# Patient Record
Sex: Female | Born: 1950
Health system: Southern US, Community
[De-identification: ages and names within clinical notes are randomized; demographics above are authoritative.]

## PROBLEM LIST (undated history)

## (undated) DIAGNOSIS — I252 Old myocardial infarction: Secondary | ICD-10-CM

## (undated) DIAGNOSIS — K760 Fatty (change of) liver, not elsewhere classified: Secondary | ICD-10-CM

## (undated) DIAGNOSIS — Z8742 Personal history of other diseases of the female genital tract: Secondary | ICD-10-CM

## (undated) DIAGNOSIS — G4733 Obstructive sleep apnea (adult) (pediatric): Secondary | ICD-10-CM

## (undated) DIAGNOSIS — Z973 Presence of spectacles and contact lenses: Secondary | ICD-10-CM

## (undated) DIAGNOSIS — Z9289 Personal history of other medical treatment: Secondary | ICD-10-CM

## (undated) DIAGNOSIS — Z9989 Dependence on other enabling machines and devices: Secondary | ICD-10-CM

## (undated) DIAGNOSIS — E785 Hyperlipidemia, unspecified: Secondary | ICD-10-CM

## (undated) DIAGNOSIS — F329 Major depressive disorder, single episode, unspecified: Secondary | ICD-10-CM

## (undated) DIAGNOSIS — E119 Type 2 diabetes mellitus without complications: Secondary | ICD-10-CM

## (undated) DIAGNOSIS — F32A Depression, unspecified: Secondary | ICD-10-CM

## (undated) DIAGNOSIS — Z955 Presence of coronary angioplasty implant and graft: Secondary | ICD-10-CM

## (undated) DIAGNOSIS — J3089 Other allergic rhinitis: Secondary | ICD-10-CM

## (undated) DIAGNOSIS — R0602 Shortness of breath: Secondary | ICD-10-CM

## (undated) DIAGNOSIS — E559 Vitamin D deficiency, unspecified: Secondary | ICD-10-CM

## (undated) DIAGNOSIS — M21961 Unspecified acquired deformity of right lower leg: Secondary | ICD-10-CM

## (undated) DIAGNOSIS — I10 Essential (primary) hypertension: Secondary | ICD-10-CM

## (undated) DIAGNOSIS — Z972 Presence of dental prosthetic device (complete) (partial): Secondary | ICD-10-CM

## (undated) DIAGNOSIS — K219 Gastro-esophageal reflux disease without esophagitis: Secondary | ICD-10-CM

## (undated) DIAGNOSIS — I2510 Atherosclerotic heart disease of native coronary artery without angina pectoris: Secondary | ICD-10-CM

## (undated) DIAGNOSIS — J302 Other seasonal allergic rhinitis: Secondary | ICD-10-CM

## (undated) DIAGNOSIS — Z8639 Personal history of other endocrine, nutritional and metabolic disease: Secondary | ICD-10-CM

## (undated) DIAGNOSIS — M79671 Pain in right foot: Secondary | ICD-10-CM

## (undated) DIAGNOSIS — M549 Dorsalgia, unspecified: Secondary | ICD-10-CM

## (undated) DIAGNOSIS — K5909 Other constipation: Secondary | ICD-10-CM

## (undated) DIAGNOSIS — K5900 Constipation, unspecified: Secondary | ICD-10-CM

## (undated) DIAGNOSIS — G4730 Sleep apnea, unspecified: Secondary | ICD-10-CM

## (undated) DIAGNOSIS — M2550 Pain in unspecified joint: Secondary | ICD-10-CM

## (undated) HISTORY — DX: Gastro-esophageal reflux disease without esophagitis: K21.9

## (undated) HISTORY — DX: Atherosclerotic heart disease of native coronary artery without angina pectoris: I25.10

## (undated) HISTORY — DX: Dorsalgia, unspecified: M54.9

## (undated) HISTORY — DX: Major depressive disorder, single episode, unspecified: F32.9

## (undated) HISTORY — DX: Depression, unspecified: F32.A

## (undated) HISTORY — DX: Constipation, unspecified: K59.00

## (undated) HISTORY — DX: Pain in unspecified joint: M25.50

## (undated) HISTORY — DX: Essential (primary) hypertension: I10

## (undated) HISTORY — PX: TONSILLECTOMY AND ADENOIDECTOMY: SUR1326

## (undated) HISTORY — PX: CORONARY ANGIOPLASTY WITH STENT PLACEMENT: SHX49

## (undated) HISTORY — DX: Hyperlipidemia, unspecified: E78.5

## (undated) HISTORY — PX: CARDIAC CATHETERIZATION: SHX172

## (undated) HISTORY — DX: Fatty (change of) liver, not elsewhere classified: K76.0

## (undated) HISTORY — DX: Sleep apnea, unspecified: G47.30

## (undated) HISTORY — DX: Old myocardial infarction: I25.2

## (undated) HISTORY — PX: CARDIOVASCULAR STRESS TEST: SHX262

## (undated) HISTORY — DX: Shortness of breath: R06.02

---
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
## 1983-06-12 HISTORY — PX: TUBAL LIGATION: SHX77

## 1998-08-04 ENCOUNTER — Other Ambulatory Visit: Admission: RE | Admit: 1998-08-04 | Discharge: 1998-08-04 | Payer: Self-pay | Admitting: Obstetrics and Gynecology

## 1998-08-08 ENCOUNTER — Encounter: Admission: RE | Admit: 1998-08-08 | Discharge: 1998-11-06 | Payer: Self-pay | Admitting: Obstetrics and Gynecology

## 2000-02-21 ENCOUNTER — Other Ambulatory Visit: Admission: RE | Admit: 2000-02-21 | Discharge: 2000-02-21 | Payer: Self-pay | Admitting: Obstetrics and Gynecology

## 2001-06-11 ENCOUNTER — Encounter: Payer: Self-pay | Admitting: Internal Medicine

## 2001-06-11 LAB — CONVERTED CEMR LAB

## 2001-06-26 ENCOUNTER — Other Ambulatory Visit: Admission: RE | Admit: 2001-06-26 | Discharge: 2001-06-26 | Payer: Self-pay | Admitting: Obstetrics & Gynecology

## 2001-10-24 ENCOUNTER — Inpatient Hospital Stay (HOSPITAL_COMMUNITY): Admission: EM | Admit: 2001-10-24 | Discharge: 2001-10-28 | Payer: Self-pay | Admitting: Emergency Medicine

## 2001-10-24 DIAGNOSIS — I252 Old myocardial infarction: Secondary | ICD-10-CM

## 2001-10-24 HISTORY — DX: Old myocardial infarction: I25.2

## 2001-10-27 DIAGNOSIS — Z955 Presence of coronary angioplasty implant and graft: Secondary | ICD-10-CM

## 2001-10-27 HISTORY — DX: Presence of coronary angioplasty implant and graft: Z95.5

## 2002-09-11 ENCOUNTER — Ambulatory Visit (HOSPITAL_COMMUNITY): Admission: RE | Admit: 2002-09-11 | Discharge: 2002-09-11 | Payer: Self-pay | Admitting: Cardiology

## 2002-09-11 ENCOUNTER — Encounter: Payer: Self-pay | Admitting: Cardiology

## 2004-08-23 ENCOUNTER — Ambulatory Visit: Payer: Self-pay | Admitting: Cardiology

## 2004-08-29 ENCOUNTER — Ambulatory Visit: Payer: Self-pay | Admitting: Internal Medicine

## 2004-08-30 ENCOUNTER — Ambulatory Visit: Payer: Self-pay | Admitting: Cardiology

## 2004-08-31 ENCOUNTER — Ambulatory Visit: Payer: Self-pay | Admitting: Internal Medicine

## 2004-08-31 ENCOUNTER — Ambulatory Visit: Payer: Self-pay

## 2004-09-06 ENCOUNTER — Ambulatory Visit: Payer: Self-pay | Admitting: Cardiology

## 2004-10-10 ENCOUNTER — Ambulatory Visit: Payer: Self-pay | Admitting: Internal Medicine

## 2004-10-23 ENCOUNTER — Encounter: Admission: RE | Admit: 2004-10-23 | Discharge: 2005-01-21 | Payer: Self-pay | Admitting: Internal Medicine

## 2004-10-23 ENCOUNTER — Ambulatory Visit: Payer: Self-pay | Admitting: Internal Medicine

## 2004-11-07 ENCOUNTER — Ambulatory Visit (HOSPITAL_BASED_OUTPATIENT_CLINIC_OR_DEPARTMENT_OTHER): Admission: RE | Admit: 2004-11-07 | Discharge: 2004-11-07 | Payer: Self-pay | Admitting: Internal Medicine

## 2004-11-12 ENCOUNTER — Ambulatory Visit: Payer: Self-pay | Admitting: Internal Medicine

## 2004-11-20 ENCOUNTER — Ambulatory Visit: Payer: Self-pay | Admitting: Internal Medicine

## 2004-11-22 ENCOUNTER — Ambulatory Visit: Payer: Self-pay | Admitting: Internal Medicine

## 2004-11-22 ENCOUNTER — Ambulatory Visit: Payer: Self-pay | Admitting: Cardiology

## 2004-12-11 ENCOUNTER — Ambulatory Visit: Payer: Self-pay | Admitting: Internal Medicine

## 2005-01-05 ENCOUNTER — Ambulatory Visit: Payer: Self-pay | Admitting: Internal Medicine

## 2005-01-10 ENCOUNTER — Ambulatory Visit: Payer: Self-pay | Admitting: Internal Medicine

## 2005-04-13 ENCOUNTER — Ambulatory Visit: Payer: Self-pay | Admitting: Cardiology

## 2005-08-06 ENCOUNTER — Ambulatory Visit: Payer: Self-pay | Admitting: Internal Medicine

## 2005-09-12 ENCOUNTER — Ambulatory Visit: Payer: Self-pay | Admitting: Internal Medicine

## 2005-10-04 ENCOUNTER — Ambulatory Visit: Payer: Self-pay | Admitting: Internal Medicine

## 2006-03-04 ENCOUNTER — Ambulatory Visit: Payer: Self-pay | Admitting: Internal Medicine

## 2006-04-26 ENCOUNTER — Ambulatory Visit: Payer: Self-pay | Admitting: Internal Medicine

## 2006-05-31 ENCOUNTER — Ambulatory Visit: Payer: Self-pay | Admitting: Cardiology

## 2006-06-19 ENCOUNTER — Ambulatory Visit: Payer: Self-pay | Admitting: Internal Medicine

## 2007-01-11 ENCOUNTER — Encounter: Payer: Self-pay | Admitting: Internal Medicine

## 2007-01-11 DIAGNOSIS — G4733 Obstructive sleep apnea (adult) (pediatric): Secondary | ICD-10-CM | POA: Insufficient documentation

## 2007-01-11 DIAGNOSIS — I2510 Atherosclerotic heart disease of native coronary artery without angina pectoris: Secondary | ICD-10-CM | POA: Insufficient documentation

## 2007-01-11 DIAGNOSIS — E669 Obesity, unspecified: Secondary | ICD-10-CM | POA: Insufficient documentation

## 2007-01-11 DIAGNOSIS — F331 Major depressive disorder, recurrent, moderate: Secondary | ICD-10-CM | POA: Insufficient documentation

## 2007-01-11 DIAGNOSIS — I10 Essential (primary) hypertension: Secondary | ICD-10-CM | POA: Insufficient documentation

## 2007-01-11 DIAGNOSIS — J3089 Other allergic rhinitis: Secondary | ICD-10-CM | POA: Insufficient documentation

## 2007-01-11 DIAGNOSIS — E118 Type 2 diabetes mellitus with unspecified complications: Secondary | ICD-10-CM | POA: Insufficient documentation

## 2007-01-11 DIAGNOSIS — K219 Gastro-esophageal reflux disease without esophagitis: Secondary | ICD-10-CM | POA: Insufficient documentation

## 2007-01-11 DIAGNOSIS — E1169 Type 2 diabetes mellitus with other specified complication: Secondary | ICD-10-CM | POA: Insufficient documentation

## 2007-01-11 DIAGNOSIS — J302 Other seasonal allergic rhinitis: Secondary | ICD-10-CM | POA: Insufficient documentation

## 2007-01-11 DIAGNOSIS — E785 Hyperlipidemia, unspecified: Secondary | ICD-10-CM | POA: Insufficient documentation

## 2007-01-11 DIAGNOSIS — F329 Major depressive disorder, single episode, unspecified: Secondary | ICD-10-CM | POA: Insufficient documentation

## 2007-02-08 ENCOUNTER — Encounter: Payer: Self-pay | Admitting: *Deleted

## 2007-05-22 ENCOUNTER — Encounter: Payer: Self-pay | Admitting: Internal Medicine

## 2007-08-20 ENCOUNTER — Encounter: Payer: Self-pay | Admitting: Internal Medicine

## 2007-08-22 ENCOUNTER — Encounter: Payer: Self-pay | Admitting: Internal Medicine

## 2007-09-25 ENCOUNTER — Ambulatory Visit: Payer: Self-pay | Admitting: Internal Medicine

## 2007-09-25 LAB — CONVERTED CEMR LAB
ALT: 36 units/L — ABNORMAL HIGH (ref 0–35)
AST: 25 units/L (ref 0–37)
Albumin: 3.9 g/dL (ref 3.5–5.2)
Alkaline Phosphatase: 61 units/L (ref 39–117)
BUN: 15 mg/dL (ref 6–23)
Basophils Absolute: 0 10*3/uL (ref 0.0–0.1)
Basophils Relative: 0.1 % (ref 0.0–1.0)
Bilirubin Urine: NEGATIVE
Bilirubin, Direct: 0.1 mg/dL (ref 0.0–0.3)
CO2: 27 meq/L (ref 19–32)
Calcium: 9 mg/dL (ref 8.4–10.5)
Chloride: 107 meq/L (ref 96–112)
Cholesterol: 119 mg/dL (ref 0–200)
Creatinine, Ser: 0.7 mg/dL (ref 0.4–1.2)
Eosinophils Absolute: 0.1 10*3/uL (ref 0.0–0.7)
Eosinophils Relative: 2.3 % (ref 0.0–5.0)
GFR calc Af Amer: 111 mL/min
GFR calc non Af Amer: 92 mL/min
Glucose, Bld: 120 mg/dL — ABNORMAL HIGH (ref 70–99)
HCT: 41.1 % (ref 36.0–46.0)
HDL: 42.2 mg/dL (ref >39.0)
Hemoglobin, Urine: NEGATIVE
Hemoglobin: 14 g/dL (ref 12.0–15.0)
Hgb A1c MFr Bld: 6.5 % — ABNORMAL HIGH (ref 4.6–6.0)
Ketones, ur: NEGATIVE mg/dL
LDL Cholesterol: 57 mg/dL (ref 0–99)
Lymphocytes Relative: 36.7 % (ref 12.0–46.0)
MCHC: 34.1 g/dL (ref 30.0–36.0)
MCV: 88.5 fL (ref 78.0–100.0)
Monocytes Absolute: 0.2 10*3/uL (ref 0.1–1.0)
Monocytes Relative: 4.8 % (ref 3.0–12.0)
Mucus, UA: NEGATIVE
Neutro Abs: 2.9 10*3/uL (ref 1.4–7.7)
Neutrophils Relative %: 56.1 % (ref 43.0–77.0)
Nitrite: NEGATIVE
Platelets: 170 10*3/uL (ref 150–400)
Potassium: 3.5 meq/L (ref 3.5–5.1)
RBC / HPF: NONE SEEN
RBC: 4.64 M/uL (ref 3.87–5.11)
RDW: 14.6 % (ref 11.5–14.6)
Sodium: 145 meq/L (ref 135–145)
Specific Gravity, Urine: 1.025 (ref 1.000–1.03)
TSH: 0.5 microintl units/mL (ref 0.35–5.50)
Total Bilirubin: 0.5 mg/dL (ref 0.3–1.2)
Total CHOL/HDL Ratio: 2.8
Total Protein: 6.6 g/dL (ref 6.0–8.3)
Triglycerides: 98 mg/dL (ref 0–149)
Urine Glucose: NEGATIVE mg/dL
Urobilinogen, UA: 0.2 (ref 0.0–1.0)
VLDL: 20 mg/dL (ref 0–40)
WBC: 5.1 10*3/uL (ref 4.5–10.5)
pH: 6.5 (ref 5.0–8.0)

## 2007-09-26 ENCOUNTER — Encounter: Payer: Self-pay | Admitting: Internal Medicine

## 2007-10-01 ENCOUNTER — Ambulatory Visit: Payer: Self-pay | Admitting: Internal Medicine

## 2007-10-01 DIAGNOSIS — K5900 Constipation, unspecified: Secondary | ICD-10-CM | POA: Insufficient documentation

## 2007-10-08 ENCOUNTER — Telehealth: Payer: Self-pay | Admitting: Internal Medicine

## 2007-10-09 ENCOUNTER — Ambulatory Visit: Payer: Self-pay | Admitting: Cardiology

## 2007-10-16 ENCOUNTER — Ambulatory Visit: Payer: Self-pay | Admitting: Gastroenterology

## 2007-10-28 ENCOUNTER — Telehealth: Payer: Self-pay | Admitting: Internal Medicine

## 2007-11-05 ENCOUNTER — Encounter: Payer: Self-pay | Admitting: Gastroenterology

## 2007-11-05 ENCOUNTER — Ambulatory Visit: Payer: Self-pay | Admitting: Gastroenterology

## 2007-11-05 LAB — HM COLONOSCOPY

## 2007-11-07 ENCOUNTER — Encounter: Payer: Self-pay | Admitting: Gastroenterology

## 2007-11-11 ENCOUNTER — Encounter: Payer: Self-pay | Admitting: Internal Medicine

## 2007-11-11 ENCOUNTER — Ambulatory Visit: Payer: Self-pay

## 2008-06-15 ENCOUNTER — Telehealth (INDEPENDENT_AMBULATORY_CARE_PROVIDER_SITE_OTHER): Payer: Self-pay | Admitting: *Deleted

## 2008-07-02 ENCOUNTER — Encounter: Payer: Self-pay | Admitting: Internal Medicine

## 2008-08-05 ENCOUNTER — Ambulatory Visit (HOSPITAL_BASED_OUTPATIENT_CLINIC_OR_DEPARTMENT_OTHER): Admission: RE | Admit: 2008-08-05 | Discharge: 2008-08-05 | Payer: Self-pay | Admitting: Internal Medicine

## 2008-08-05 ENCOUNTER — Ambulatory Visit: Payer: Self-pay | Admitting: Internal Medicine

## 2008-08-05 ENCOUNTER — Ambulatory Visit: Payer: Self-pay | Admitting: Diagnostic Radiology

## 2008-08-05 DIAGNOSIS — R498 Other voice and resonance disorders: Secondary | ICD-10-CM | POA: Insufficient documentation

## 2008-08-05 DIAGNOSIS — F17200 Nicotine dependence, unspecified, uncomplicated: Secondary | ICD-10-CM

## 2008-08-05 LAB — CONVERTED CEMR LAB
ALT: 54 units/L — ABNORMAL HIGH (ref 0–35)
AST: 31 units/L (ref 0–37)
Albumin: 3.9 g/dL (ref 3.5–5.2)
Alkaline Phosphatase: 66 units/L (ref 39–117)
BUN: 10 mg/dL (ref 6–23)
Bilirubin, Direct: 0.1 mg/dL (ref 0.0–0.3)
CO2: 29 meq/L (ref 19–32)
Calcium: 8.7 mg/dL (ref 8.4–10.5)
Chloride: 105 meq/L (ref 96–112)
Creatinine, Ser: 0.7 mg/dL (ref 0.4–1.2)
Creatinine,U: 195.3 mg/dL
GFR calc Af Amer: 111 mL/min
GFR calc non Af Amer: 91 mL/min
Glucose, Bld: 134 mg/dL — ABNORMAL HIGH (ref 70–99)
Hgb A1c MFr Bld: 6.7 % — ABNORMAL HIGH (ref 4.6–6.0)
Microalb Creat Ratio: 9.7 mg/g (ref 0.0–30.0)
Microalb, Ur: 1.9 mg/dL (ref 0.0–1.9)
Potassium: 3.7 meq/L (ref 3.5–5.1)
Sodium: 142 meq/L (ref 135–145)
TSH: 0.62 microintl units/mL (ref 0.35–5.50)
Total Bilirubin: 0.6 mg/dL (ref 0.3–1.2)
Total Protein: 6.7 g/dL (ref 6.0–8.3)

## 2008-09-13 ENCOUNTER — Encounter: Payer: Self-pay | Admitting: Internal Medicine

## 2008-09-15 ENCOUNTER — Encounter (INDEPENDENT_AMBULATORY_CARE_PROVIDER_SITE_OTHER): Payer: Self-pay | Admitting: *Deleted

## 2008-12-21 ENCOUNTER — Telehealth: Payer: Self-pay | Admitting: Family Medicine

## 2008-12-31 ENCOUNTER — Telehealth: Payer: Self-pay | Admitting: Internal Medicine

## 2009-01-06 ENCOUNTER — Ambulatory Visit: Payer: Self-pay | Admitting: Cardiology

## 2009-01-06 DIAGNOSIS — R0602 Shortness of breath: Secondary | ICD-10-CM | POA: Insufficient documentation

## 2009-01-11 ENCOUNTER — Ambulatory Visit: Payer: Self-pay | Admitting: Internal Medicine

## 2009-01-11 DIAGNOSIS — M25539 Pain in unspecified wrist: Secondary | ICD-10-CM | POA: Insufficient documentation

## 2009-01-11 LAB — HM DIABETES FOOT EXAM

## 2009-01-12 ENCOUNTER — Ambulatory Visit: Payer: Self-pay | Admitting: Cardiology

## 2009-01-12 DIAGNOSIS — I10 Essential (primary) hypertension: Secondary | ICD-10-CM | POA: Insufficient documentation

## 2009-01-13 ENCOUNTER — Encounter (INDEPENDENT_AMBULATORY_CARE_PROVIDER_SITE_OTHER): Payer: Self-pay | Admitting: *Deleted

## 2009-01-13 LAB — CONVERTED CEMR LAB
ALT: 44 units/L — ABNORMAL HIGH (ref 0–35)
AST: 33 units/L (ref 0–37)
Albumin: 3.7 g/dL (ref 3.5–5.2)
Alkaline Phosphatase: 63 units/L (ref 39–117)
BUN: 15 mg/dL (ref 6–23)
Bilirubin, Direct: 0.2 mg/dL (ref 0.0–0.3)
CO2: 29 meq/L (ref 19–32)
Calcium: 8.9 mg/dL (ref 8.4–10.5)
Chloride: 106 meq/L (ref 96–112)
Cholesterol: 134 mg/dL (ref 0–200)
Creatinine, Ser: 0.8 mg/dL (ref 0.4–1.2)
GFR calc non Af Amer: 78.04 mL/min (ref >60)
Glucose, Bld: 143 mg/dL — ABNORMAL HIGH (ref 70–99)
HDL: 37.3 mg/dL — ABNORMAL LOW (ref >39.00)
LDL Cholesterol: 75 mg/dL (ref 0–99)
Potassium: 4.2 meq/L (ref 3.5–5.1)
Sodium: 142 meq/L (ref 135–145)
Total Bilirubin: 0.7 mg/dL (ref 0.3–1.2)
Total CHOL/HDL Ratio: 4
Total Protein: 6.4 g/dL (ref 6.0–8.3)
Triglycerides: 108 mg/dL (ref 0.0–149.0)
VLDL: 21.6 mg/dL (ref 0.0–40.0)

## 2009-02-21 ENCOUNTER — Telehealth: Payer: Self-pay | Admitting: Internal Medicine

## 2009-03-07 ENCOUNTER — Ambulatory Visit: Payer: Self-pay | Admitting: Cardiology

## 2009-06-23 ENCOUNTER — Telehealth: Payer: Self-pay | Admitting: Gastroenterology

## 2009-08-30 ENCOUNTER — Ambulatory Visit: Payer: Self-pay | Admitting: Internal Medicine

## 2009-08-30 LAB — CONVERTED CEMR LAB
BUN: 13 mg/dL (ref 6–23)
CO2: 23 meq/L (ref 19–32)
Calcium: 9.4 mg/dL (ref 8.4–10.5)
Chloride: 106 meq/L (ref 96–112)
Creatinine, Ser: 0.84 mg/dL (ref 0.40–1.20)
Glucose, Bld: 111 mg/dL — ABNORMAL HIGH (ref 70–99)
Hgb A1c MFr Bld: 6.7 % — ABNORMAL HIGH (ref 4.6–6.1)
Potassium: 3.9 meq/L (ref 3.5–5.3)
Sodium: 142 meq/L (ref 135–145)

## 2009-08-31 ENCOUNTER — Encounter: Payer: Self-pay | Admitting: Internal Medicine

## 2009-10-03 ENCOUNTER — Encounter: Payer: Self-pay | Admitting: Internal Medicine

## 2009-10-04 ENCOUNTER — Ambulatory Visit: Payer: Self-pay | Admitting: Internal Medicine

## 2009-10-04 DIAGNOSIS — J40 Bronchitis, not specified as acute or chronic: Secondary | ICD-10-CM | POA: Insufficient documentation

## 2009-11-16 ENCOUNTER — Encounter (INDEPENDENT_AMBULATORY_CARE_PROVIDER_SITE_OTHER): Payer: Self-pay | Admitting: *Deleted

## 2009-12-27 ENCOUNTER — Ambulatory Visit: Payer: Self-pay | Admitting: Internal Medicine

## 2009-12-27 LAB — CONVERTED CEMR LAB
ALT: 26 units/L (ref 0–35)
AST: 20 units/L (ref 0–37)
BUN: 13 mg/dL (ref 6–23)
CO2: 26 meq/L (ref 19–32)
Calcium: 9.4 mg/dL (ref 8.4–10.5)
Chloride: 105 meq/L (ref 96–112)
Cholesterol: 142 mg/dL (ref 0–200)
Creatinine, Ser: 0.7 mg/dL (ref 0.4–1.2)
Direct LDL: 77.7 mg/dL
GFR calc non Af Amer: 89.27 mL/min (ref >60)
Glucose, Bld: 99 mg/dL (ref 70–99)
HDL: 42 mg/dL (ref >39.00)
Hgb A1c MFr Bld: 6.1 % (ref 4.6–6.5)
Potassium: 3.9 meq/L (ref 3.5–5.1)
Sodium: 139 meq/L (ref 135–145)
Total CHOL/HDL Ratio: 3
Triglycerides: 204 mg/dL — ABNORMAL HIGH (ref 0.0–149.0)
VLDL: 40.8 mg/dL — ABNORMAL HIGH (ref 0.0–40.0)

## 2009-12-28 ENCOUNTER — Telehealth: Payer: Self-pay | Admitting: Internal Medicine

## 2010-01-02 ENCOUNTER — Telehealth: Payer: Self-pay | Admitting: Internal Medicine

## 2010-01-03 ENCOUNTER — Ambulatory Visit: Payer: Self-pay | Admitting: Internal Medicine

## 2010-01-03 ENCOUNTER — Ambulatory Visit: Payer: Self-pay | Admitting: Cardiology

## 2010-03-21 ENCOUNTER — Encounter: Payer: Self-pay | Admitting: Internal Medicine

## 2010-03-27 ENCOUNTER — Telehealth: Payer: Self-pay | Admitting: Internal Medicine

## 2010-03-29 ENCOUNTER — Ambulatory Visit: Payer: Self-pay | Admitting: Internal Medicine

## 2010-03-29 LAB — CONVERTED CEMR LAB
BUN: 15 mg/dL (ref 6–23)
CO2: 32 meq/L (ref 19–32)
Calcium: 9.8 mg/dL (ref 8.4–10.5)
Chloride: 106 meq/L (ref 96–112)
Creatinine, Ser: 0.8 mg/dL (ref 0.4–1.2)
GFR calc non Af Amer: 73.46 mL/min (ref >60)
Glucose, Bld: 98 mg/dL (ref 70–99)
Hgb A1c MFr Bld: 6.2 % (ref 4.6–6.5)
Potassium: 4.4 meq/L (ref 3.5–5.1)
Sodium: 143 meq/L (ref 135–145)

## 2010-04-06 ENCOUNTER — Ambulatory Visit: Payer: Self-pay | Admitting: Diagnostic Radiology

## 2010-04-06 ENCOUNTER — Ambulatory Visit (HOSPITAL_BASED_OUTPATIENT_CLINIC_OR_DEPARTMENT_OTHER): Admission: RE | Admit: 2010-04-06 | Discharge: 2010-04-06 | Payer: Self-pay | Admitting: Internal Medicine

## 2010-04-06 ENCOUNTER — Ambulatory Visit: Payer: Self-pay | Admitting: Internal Medicine

## 2010-04-06 LAB — CONVERTED CEMR LAB
ALT: 19 units/L (ref 0–35)
AST: 13 units/L (ref 0–37)
Albumin: 4.2 g/dL (ref 3.5–5.2)
Alkaline Phosphatase: 68 units/L (ref 39–117)
Bilirubin, Direct: 0.1 mg/dL (ref 0.0–0.3)
Indirect Bilirubin: 0.2 mg/dL (ref 0.0–0.9)
Total Bilirubin: 0.3 mg/dL (ref 0.3–1.2)
Total Protein: 6.4 g/dL (ref 6.0–8.3)

## 2010-04-07 ENCOUNTER — Telehealth: Payer: Self-pay | Admitting: Internal Medicine

## 2010-04-18 ENCOUNTER — Encounter: Admission: RE | Admit: 2010-04-18 | Discharge: 2010-04-18 | Payer: Self-pay | Admitting: Internal Medicine

## 2010-04-18 LAB — HM MAMMOGRAPHY

## 2010-04-19 ENCOUNTER — Encounter: Payer: Self-pay | Admitting: Internal Medicine

## 2010-04-26 ENCOUNTER — Encounter: Payer: Self-pay | Admitting: Internal Medicine

## 2010-06-11 HISTORY — PX: BUNIONECTOMY: SHX129

## 2010-07-01 ENCOUNTER — Encounter: Payer: Self-pay | Admitting: Internal Medicine

## 2010-07-11 ENCOUNTER — Telehealth: Payer: Self-pay | Admitting: Internal Medicine

## 2010-07-11 NOTE — Assessment & Plan Note (Signed)
Summary: 3 month fu/dt   Vital Signs:  Patient profile:   60 year old female Height:      64 inches Weight:      176.50 pounds BMI:     30.41 O2 Sat:      96 % on Room air Temp:     98.4 degrees F rectal Pulse rate:   80 / minute Pulse rhythm:   regular Resp:     20 per minute BP sitting:   100 / 70  (left arm) Cuff size:   large  Vitals Entered By: Glendell Docker CMA (April 06, 2010 3:50 PM)  O2 Flow:  Room air CC: 3 Month follow up , Type 2 diabetes mellitus follow-up Is Patient Diabetic? Yes Did you bring your meter with you today? No Pain Assessment Patient in pain? no      Comments low blood sugar 98 high 138 avg 120   Primary Care Provider:  Dondra Spry DO  CC:  3 Month follow up  and Type 2 diabetes mellitus follow-up.  History of Present Illness:  Type 2 Diabetes Mellitus Follow-Up      This is a 60 year old woman who presents for Type 2 diabetes mellitus follow-up.  The patient denies weight gain.  The patient denies the following symptoms: chest pain.  Since the last visit the patient reports good dietary compliance, compliance with medications, and monitoring blood glucose.   htn - stable.  no dizziness but bp is low normal     Preventive Screening-Counseling & Management  Alcohol-Tobacco     Smoking Status: current     Smoking Cessation Counseling: yes  Allergies (verified): No Known Drug Allergies  Past History:  Past Medical History: CORONARY ARTERY DISEASE (ICD-414.00) HYPERTENSION (ICD-401.9) HYPERLIPIDEMIA (ICD-272.4)    OBSTRUCTIVE SLEEP APNEA (ICD-327.23)  GERD (ICD-530.81) DIABETES MELLITUS, TYPE II (ICD-250.00) DEPRESSION (ICD-311) ALLERGIC RHINITIS (ICD-477.9)  Past Surgical History:   The study was done as a Taxus followup study. The patient previously underwent percutaneous stenting using a small 2.25 mm stent in May of 2003.  Following the procedure, she has done well.  She was brought back as part of a mandated followup  catheterization as part of the Taxus 5 high-risk subset study.  The patient was randomized to drug eluting stent versus no drug-eluting stent.  This was done in the small-vessel category. PRESSURES: 1. Aorta:  147/76/104. Left ventricle 152/11.Marland Kitchen No gradient on pullback across the aortic valve. ANGIOGRAPHIC DATA:Ventriculography was performed the RAO projection. Because of ectopy, the ejection fraction was not calculated.  The EF appeared in excess of 60%.No definite wall-motion abnormalities were identified.  The aortic valve appeared to open well. Well-preserved left ventricular function.. Continued patency of the previously-placed stent in the diagonal branch. Mild luminal irregularities as noted.  Arturo Morton. Riley Kill, M.D. LHCTDS/MEDQ  D:  09/11/2002  T:  09/13/2002  Job:  [id number]    TAXUS trial, and a 2.25 x 16 mm stent was utilized.  Thisstent was used to cover both the high-grade lesion as well as some segmentaldisease distal to the high-grade stenosis site as a shorter stent would end in the diseased segment.  The 16 mm stent was then placed and taken up to about11 atmospheres.  There was marked improvement in the appearance of the artery.We then took a 2.5 mm x 12 Quantum Maverick balloon and stayed inside the stent edges and did post dilatation up to about 11 atmospheres.  This yieldedan artery of about 2.46.  There was no evidence of edge tear.Dictated by:   Arturo Morton Riley Kill, M.D. [facility] Attending Physician:  Olga Millers SandersDD:  10/27/01  Family History: noncontributory     Physical Exam  General:  alert, well-developed, and well-nourished.   Lungs:  normal respiratory effort and normal breath sounds.   Heart:  normal rate, regular rhythm, and no gallop.   Extremities:  no edema.   Impression & Recommendations:  Problem # 1:  DIABETES MELLITUS, TYPE II (ICD-250.00) Assessment Unchanged  Her updated medication list for this problem includes:    Metformin Hcl 500 Mg Xr24h-tab  (Metformin hcl) ..... One by mouth three times a day    Aspirin Low Dose 81 Mg Tabs (Aspirin) .Marland Kitchen... Take 1 tablet by mouth once a day    Victoza 18 Mg/35ml Soln (Liraglutide) ..... Inject 1.2 mg [place]  once daily    Benicar Hct 20-12.5 Mg Tabs (Olmesartan medoxomil-hctz) ..... One half tab by mouth once daily  Labs Reviewed: Creat: 0.8 (03/29/2010)    Reviewed HgBA1c results: 6.2 (03/29/2010)  6.1 (12/27/2009)  Problem # 2:  HYPERLIPIDEMIA (ICD-272.4) change vytorin to crestor.  Her updated medication list for this problem includes:    Crestor 20 Mg Tabs (Rosuvastatin calcium) ..... One by mouth once daily  Orders: T-Hepatic Function [id number])  Labs Reviewed: SGOT: 20 (12/27/2009)   SGPT: 26 (12/27/2009)   HDL:42.00 (12/27/2009), 37.30 (01/12/2009)  LDL:75 (01/12/2009), 57 (09/25/2007)  Chol:142 (12/27/2009), 134 (01/12/2009)  Trig:204.0 (12/27/2009), 108.0 (01/12/2009)  Problem # 3:  ESSENTIAL HYPERTENSION, BENIGN (ICD-401.1) bp is low normal.  take 1/2 of benicar / hctz dose Her updated medication list for this problem includes:    Metoprolol Tartrate 50 Mg Tabs (Metoprolol tartrate) .Marland Kitchen... Take 1 tablet by mouth twice a day    Benicar Hct 20-12.5 Mg Tabs (Olmesartan medoxomil-hctz) ..... One half tab by mouth once daily  BP today: 100/70 Prior BP: 104/70 (01/03/2010)  Labs Reviewed: K+: 4.4 (03/29/2010) Creat: : 0.8 (03/29/2010)   Chol: 142 (12/27/2009)   HDL: 42.00 (12/27/2009)   LDL: 75 (01/12/2009)   TG: 204.0 (12/27/2009)  Complete Medication List: 1)  Metformin Hcl 500 Mg Xr24h-tab (Metformin hcl) .... One by mouth three times a day 2)  Metoprolol Tartrate 50 Mg Tabs (Metoprolol tartrate) .... Take 1 tablet by mouth twice a day 3)  Onetouch Test Strp (Glucose blood) .... Use 1 strip once a day 4)  Crestor 20 Mg Tabs (Rosuvastatin calcium) .... One by mouth once daily 5)  Wellbutrin Sr 150 Mg Xr12h-tab (Bupropion hcl) .... 3 tabs once daily 6)  Onetouch Ultrasoft  Lancets Misc (Lancets) .... Test blood sugar once daily 7)  Aspirin Low Dose 81 Mg Tabs (Aspirin) .... Take 1 tablet by mouth once a day 8)  Omeprazole 40 Mg Cpdr (Omeprazole) .... One by mouth once daily 30 mins before am meal 9)  Abilify 5 Mg Tabs (Aripiprazole) .... Take 1 tablet by mouth once a day 10)  Victoza 18 Mg/28ml Soln (Liraglutide) .... Inject 1.2 mg [place]  once daily 11)  Relion Pen Needles 31g X 8 Mm Misc (Insulin pen needle) .... Use once daily as directed 12)  Benicar Hct 20-12.5 Mg Tabs (Olmesartan medoxomil-hctz) .... One half tab by mouth once daily 13)  Multivitamins Tabs (Multiple vitamin) .... Once daily 14)  Qvar 40 Mcg/act Aers (Beclomethasone dipropionate) .... 2 puffs two times a day 15)  Terbutaline Sulfate Tabs (terbutaline Sulfate)  .... Take 1 tablet by mouth once a day (patient unable  to verify dose)  Other Orders: Mammogram (Screening) (Mammo)  Patient Instructions: 1)  Please schedule a follow-up appointment in 4 months. 2)  BMP prior to visit, ICD-9:  401.9 3)  Hepatic Panel prior to visit, ICD-9:  272.4 4)  Lipid Panel prior to visit, ICD-9: 272.4 5)  HbgA1C prior to visit, ICD-9:  250.00 6)  Urine Microalbumin prior to visit, ICD-9: 250.00 7)  Please return for lab work one (1) week before your next appointment.  Prescriptions: CRESTOR 20 MG TABS (ROSUVASTATIN CALCIUM) one by mouth once daily  #30 x 5   Entered and Authorized by:   D. Thomos Lemons DO   Signed by:   D. Thomos Lemons DO on 04/06/2010   Method used:   Electronically to        [facility] [address] [phone].* (retail)       [address]       [place], [place]  [address]       Ph: [phone]       Fax: [phone]   RxID:   [id number] RELION PEN NEEDLES 31G X 8 MM MISC (INSULIN PEN NEEDLE) use once daily as directed  #100 x 3   Entered and Authorized by:   D. Thomos Lemons DO   Signed by:   D. Thomos Lemons DO on 04/06/2010   Method used:   Electronically to         [facility] [address] [phone].* (retail)       [address]       [place], [place]  [address]       Ph: [phone]       Fax: [phone]   RxID:   [id number] [facility] TEST  STRP (GLUCOSE BLOOD) Use 1 strip once a day  #100 x 3   Entered and Authorized by:   D. Thomos Lemons DO   Signed by:   D. Thomos Lemons DO on 04/06/2010   Method used:   Electronically to        [facility] [address] [phone].* (retail)       [address]       [place], [place]  [address]       Ph: [phone]       Fax: [phone]   RxID:   [id number] METFORMIN HCL 500 MG XR24H-TAB (METFORMIN HCL) one by mouth three times a day  #90 x 5   Entered and Authorized by:   D. Thomos Lemons DO   Signed by:   D. Thomos Lemons DO on 04/06/2010   Method used:   Electronically to        [facility] [address] [phone].* (retail)       [address]       [place], [place]  [address]       Ph: [phone]       Fax: [phone]   RxID:   [id number] VICTOZA 18 MG/3ML SOLN (LIRAGLUTIDE) inject 1.2 mg [place]  once daily  #1 month x 5   Entered and Authorized by:   D. Thomos Lemons DO   Signed by:   D. Thomos Lemons DO on 04/06/2010   Method used:   Electronically to        [facility]  [address] [phone].* (retail)       [address]       [place], [place]  [address]       Ph: [phone]       Fax: [phone]   RxID:   [id number]    Orders Added: 1)  Mammogram (Screening) [Mammo] 2)  T-Hepatic Function [80076-22960] 3)  Est. Patient Level III [[address]]   Immunization History:  Influenza Immunization History:    Influenza:  declined (04/06/2010)   Contraindications/Deferment of Procedures/Staging:    Test/Procedure: FLU VAX    Reason for deferment: patient declined   Immunization History:  Influenza Immunization History:    Influenza:  Declined (04/06/2010)    Preventive Care Screening  Last Flu Shot:     Date:  04/06/2010    Results:  Declined  Bone Density:    Date:  03/26/2006    Results:  Done std dev   Current Allergies (reviewed today): No known allergies

## 2010-07-11 NOTE — Assessment & Plan Note (Signed)
Summary: f/u   Vital Signs:  Patient Profile:   60 Years Old Female Height:     64 inches Weight:      180 pounds BMI:     31.01 Temp:     97.9 degrees F oral Pulse rate:   68 / minute Pulse rhythm:   regular Resp:     18 per minute BP sitting:   134 / 80  (right arm) Cuff size:   regular  Vitals Entered By: Glendell Docker CMA (August 05, 2008 2:12 PM)             Is Patient Diabetic? Yes     PCP:  Dondra Spry DO  Chief Complaint:  Follow up disease management and Type 2 diabetes mellitus follow-up.  History of Present Illness: Follow up disease management  Type 2 Diabetes Mellitus Follow-Up      This is a 60 year old woman who presents for Type 2 diabetes mellitus follow-up.  The patient reports weight gain, but denies self managed hypoglycemia and hypoglycemia requiring help.  The patient denies the following symptoms: chest pain and vision loss.  Since the last visit the patient reports poor dietary compliance, compliance with medications, and not exercising regularly.    Tob abuse - still smoking.   She reports hoarseness and intermittent wheezing.  CAD - stress test 06/09 negative.  Hyperlipidemia - tolerating meds.    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  The patient denies chest pain.     Physical  Exam  General:     alert, well-developed, and well-nourished.   Head:     normocephalic and atraumatic.   Neck:     supple, no masses, and no carotid bruits.   Lungs:     normal respiratory effort and normal breath sounds.   Heart:     normal rate, regular rhythm, and no gallop.   Abdomen:     soft and non-tender.   Extremities:     No lower extremity edema  Neurologic:     cranial nerves II-XII intact and gait normal.    Diabetes Management Exam:    Foot Exam (with socks and/or shoes not present):       Sensory-Monofilament:          Left foot: normal          Right foot: normal       Inspection:          Left foot: normal          Right foot: normal    Impression & Recommendations:  Problem # 1:  HOARSENESS (ICD-784.49)  60 y/o smoker with hoarseness and upper airway wheezing.   Refer to ENT for further eval.  Orders: ENT Referral (ENT)   Problem # 2:  HYPERTENSION (ICD-401.9) BP stable.   Maintain current medication regimen. ] Her updated medication list for this problem includes:    Altace 5 Mg Caps (Ramipril) .Marland Kitchen... Take 1 capsule by mouth once a day (office visit needed for refills)    Hydrochlorothiazide 12.5 Mg Caps (Hydrochlorothiazide) .Marland Kitchen... Take 1 capsule by mouth every morning    Metoprolol Tartrate 50 Mg Tabs (Metoprolol tartrate) .Marland Kitchen... Take 1 tablet by mouth twice a day  Orders: TLB-BMP (Basic Metabolic Panel-BMET) (80048-METABOL)  BP today: 134/80 Prior BP: 137/78 (10/01/2007)  Labs Reviewed: Creat: 0.7 (09/25/2007) Chol: 119 (09/25/2007)   HDL: 42.2 (09/25/2007)   LDL: 57 (09/25/2007)   TG: 98 (09/25/2007)   Problem # 3:  DIABETES MELLITUS, TYPE II (ICD-250.00) Monitor A1c.   I urged lifestyle changes.  Her updated medication list for this problem includes:    Altace 5 Mg Caps (Ramipril) .Marland Kitchen... Take 1 capsule by mouth once a day (office visit needed for refills)    Glucophage Xr 500 Mg Tb24 (Metformin hcl) ..... One by mouth two times a day  (office visit needed for refills)    Aspirin Low Dose 81 Mg Tabs (Aspirin) .Marland Kitchen... Take 1 tablet by mouth once a day  Orders: TLB-A1C / Hgb A1C (Glycohemoglobin) (83036-A1C) TLB-Microalbumin/Creat Ratio, Urine (82043-MALB)  Labs Reviewed: HgBA1c: 6.5 (09/25/2007)   Creat: 0.7 (09/25/2007)      Problem # 4:  TOBACCO ABUSE (ICD-305.1)  The following medications were removed from the medication list:    Nicotrol 10 Mg Inha (Nicotine) ..... Use 6-8 cartridges inh qd  Her updated medication list for this problem includes:    Chantix Starting Month Pak 0.5 Mg X 11 & 1 Mg X 42 Tabs (Varenicline tartrate) .Marland Kitchen... Take as directed Encouraged smoking cessation and discussed different methods for smoking cessation.   We discussed potential side effects.  f/u in 2 wks.  Problem # 5:  CORONARY ARTERY DISEASE (ICD-414.00) Stress testing 06/09 negative.  Continue risk factor mgt.  Her updated medication list for this problem includes:    Altace 5 Mg Caps (Ramipril) .Marland Kitchen... Take 1 capsule by mouth once a day (office visit needed for refills)    Hydrochlorothiazide 12.5 Mg Caps (Hydrochlorothiazide) .Marland Kitchen... Take 1 capsule by mouth every morning    Metoprolol Tartrate 50 Mg Tabs (Metoprolol tartrate) .Marland Kitchen... Take 1 tablet by mouth twice a day    Aspirin Low Dose 81 Mg Tabs (Aspirin) .Marland Kitchen... Take 1 tablet by mouth once a day  Labs Reviewed: Chol: 119 (09/25/2007)   HDL: 42.2 (09/25/2007)   LDL: 57 (09/25/2007)   TG: 98 (09/25/2007)   Problem # 6:  HYPERLIPIDEMIA (ICD-272.4) Monitor lipids and LFT's. Her updated medication list for this problem includes:    Vytorin 10-40 Mg Tabs (Ezetimibe-simvastatin) .Marland Kitchen... Take 1 tablet by mouth once a day (office visit needed no refills authorized after this)  Orders: TLB-Hepatic/Liver Function Pnl (80076-HEPATIC) TLB-TSH (Thyroid Stimulating Hormone) (84443-TSH)  Labs Reviewed: Chol: 119 (09/25/2007)   HDL: 42.2 (09/25/2007)   LDL: 57 (09/25/2007)   TG: 98  (09/25/2007) SGOT: 25 (09/25/2007)   SGPT: 36 (09/25/2007)   Complete Medication List: 1)  Altace 5 Mg Caps (Ramipril) .... Take 1 capsule by mouth once a day (office visit needed for refills) 2)  Hydrochlorothiazide 12.5 Mg Caps (Hydrochlorothiazide) .... Take 1 capsule by mouth every morning  3)  Glucophage Xr 500 Mg Tb24 (Metformin hcl) .... One by mouth two times a day (office visit needed for refills) 4)  Metoprolol Tartrate 50 Mg Tabs (Metoprolol tartrate) .... Take 1 tablet by mouth twice a day 5)  Onetouch Test Strp (Glucose blood) .... Use 1 strip once a day 6)  Vytorin 10-40 Mg Tabs (Ezetimibe-simvastatin) .... Take 1 tablet by mouth once a day (office visit needed no refills authorized after this) 7)  Wellbutrin Sr 200 Mg Tb12 (Bupropion hcl) .... Take 3 tablet by mouth once daily 8)  Onetouch Ultrasoft Lancets Misc (Lancets) .... Test blood sugar once daily 9)  Loratadine 10 Mg Tabs (Loratadine) .... Take 1 tablet by mouth once a day 10)  Aspirin Low Dose 81 Mg Tabs (Aspirin) .... Take 1 tablet by mouth once a day 11)  Multivitamins Tabs (Multiple vitamin) .... Take 1 tablet by mouth two times a day 12)  Onetouch Ultra Test Strp (Glucose blood) .... For once daily testing 13)  Omeprazole 20 Mg Cpdr (Omeprazole) .... One by mouth once daily 14)  Chantix Starting Month Pak 0.5 Mg X 11 & 1 Mg X 42 Tabs (Varenicline tartrate) .... Take as directed  Other Orders: Mammogram (Screening) (Mammo)   Patient Instructions: 1)  Please schedule a follow-up appointment in 2 weeks.   Prescriptions: CHANTIX STARTING MONTH PAK 0.5 MG X 11 & 1 MG X 42 TABS (VARENICLINE TARTRATE) take as directed  #1 x 0   Entered and Authorized by:   D. Thomos Lemons DO   Signed by:   D. Thomos Lemons DO on 08/05/2008   Method used:   Electronically to        [facility] [address] [phone].* (retail)       [address]       [place], [place]  [address]       Ph: [phone]       Fax:  [phone]   RxID:   [id number] OMEPRAZOLE 20 MG CPDR (OMEPRAZOLE) one by mouth once daily  #30 x 3   Entered and Authorized by:   D. Thomos Lemons DO   Signed by:   D. Thomos Lemons DO on 08/05/2008   Method used:   Electronically to        [facility] [address] [phone].* (retail)       [address]       [place], [place]  [address]       Ph: [phone]       Fax: [phone]   RxID:   [id number] [facility] ULTRA TEST   STRP (GLUCOSE BLOOD) for once daily testing  #100 x 3   Entered and Authorized by:   D. Thomos Lemons DO   Signed by:   D. Thomos Lemons DO on 08/05/2008   Method used:   Electronically to        [facility] [address] [phone].* (retail)       [address]       [place], [place]  [address]       Ph: [phone]       Fax: [phone]   RxID:   [id number] [facility] TEST  STRP (GLUCOSE BLOOD) Use 1 strip once a day  #100 x 3   Entered and Authorized by:   D. Molly Maduro  Artist Pais DO   Signed by:   D. Thomos Lemons DO on 08/05/2008   Method used:   Electronically to        [facility] [address] [phone].* (retail)       [address]       [place], [place]  [address]       Ph: [phone]       Fax: [phone]   RxID:   [id number] VYTORIN 10-40 MG TABS (EZETIMIBE-SIMVASTATIN) Take 1 tablet by mouth once a day (OFFICE VISIT NEEDED NO REFILLS AUTHORIZED AFTER THIS)  #30 x 3   Entered and Authorized by:   D. Thomos Lemons DO   Signed by:   D. Thomos Lemons DO on 08/05/2008   Method used:   Electronically to        [facility] [address] [phone].* (retail)       [address]       [place], [place]  [address]       Ph: [phone]       Fax: [phone]   RxID:   [id number] METOPROLOL TARTRATE 50 MG TABS (METOPROLOL TARTRATE) Take 1 tablet by mouth twice a day  #30 x 3   Entered and Authorized by:   D. Thomos Lemons DO   Signed by:   D. Thomos Lemons DO on  08/05/2008   Method used:   Electronically to        [facility] [address] [phone].* (retail)       [address]       [place], [place]  [address]       Ph: [phone]       Fax: [phone]   RxID:   [id number] GLUCOPHAGE XR 500 MG  TB24 (METFORMIN HCL) one by mouth two times a day (office visit needed for refills)  #60 x 3   Entered and Authorized by:   D. Thomos Lemons DO   Signed by:   D. Thomos Lemons DO on 08/05/2008   Method used:   Electronically to        [facility] [address] [phone].* (retail)       [address]       [place], [place]  [address]       Ph: [phone]       Fax: [phone]   RxID:   [id number] HYDROCHLOROTHIAZIDE 12.5 MG CAPS (HYDROCHLOROTHIAZIDE) Take 1 capsule by mouth every morning  #30 x 3   Entered and Authorized by:   D. Thomos Lemons DO   Signed by:   D. Thomos Lemons DO on 08/05/2008   Method used:   Electronically to        [facility] [address] [phone].* (retail)       [address]       [place], [place]  [address]       Ph: [phone]       Fax: [phone]   RxID:   [id number] ALTACE 5 MG CAPS (RAMIPRIL) Take 1 capsule by mouth once a day (OFFICE VISIT NEEDED FOR REFILLS)  #30 x 3   Entered and Authorized by:   D. Thomos Lemons DO   Signed by:  Dondra Spry DO on 08/05/2008   Method used:   Electronically to        [facility] [address] [phone].* (retail)       [address]       [place], [place]  [address]       Ph: [phone]       Fax: [phone]   RxID:   [id number]     Current Allergies (reviewed today): No known allergies

## 2010-07-11 NOTE — Progress Notes (Signed)
Summary: refill--one touch ultrasoft lancet  Phone Note Refill Request Message from:  Fax from [facility] on January 02, 2010 10:08 AM  Refills Requested: Medication #1:  ONETOUCH ULTRASOFT LANCETS   MISC test blood sugar once daily   Dosage confirmed as above?Dosage Confirmed   Supply Requested: 100   Last Refilled: 10/01/2007 Next Appointment Scheduled: 01-03-10  Dr Artist Pais Initial call taken by: Mervin Kung CMA Duncan Dull),  January 02, 2010 10:09 AM    Prescriptions: Biagio Borg LANCETS   MISC (LANCETS) test blood sugar once daily  #100 x 1   Entered by:   Mervin Kung CMA (AAMA)   Authorized by:   D. Thomos Lemons DO   Signed by:   Mervin Kung CMA (AAMA) on 01/02/2010   Method used:   Electronically to        [facility] [address] [phone].* (retail)       [address]       [place], [place]  [address]       Ph: [phone]       Fax: [phone]   RxID:   [id number]

## 2010-07-11 NOTE — Consult Note (Signed)
Summary: [facility]  [facility]   Imported By: Maryln Gottron 09/05/2007 13:48:40  _____________________________________________________________________  External Attachment:    Type:   Image     Comment:   External Document

## 2010-07-11 NOTE — Letter (Signed)
Summary: Custom - Lipid  [facility], Main Office  [address]   [place], [place] [address]   Phone: [phone]  Fax: [phone]     January 13, 2009 MRN: [id number]   [facility] [address] [place], [place]  [address]   Dear Ms. Bamford,  We have reviewed your cholesterol results.  They are as follows:     Total Cholesterol:    134 (Desirable: less than 200)       HDL  Cholesterol:     37.30  (Desirable: greater than 40 for men and 50 for women)       LDL Cholesterol:       75  (Desirable: less than 100 for low risk and less than 70 for moderate to high risk)       Triglycerides:       108.0  (Desirable: less than 150)  Our recommendations include:These numbers look good. Continue on the same medicine. Sodium, potassium, and Liver function were normal. Take care, Dr. Darel Hong.    Call our office at the number listed above if you have any questions.  Lowering your LDL cholesterol is important, but it is only one of a large number of "risk factors" that may indicate that you are at risk for heart disease, stroke or other complications of hardening of the arteries.  Other risk factors include:   A.  Cigarette Smoking* B.  High Blood Pressure* C.  Obesity* D.   Low HDL Cholesterol (see yours above)* E.   Diabetes Mellitus (higher risk if your is uncontrolled) F.  Family history of premature heart disease G.  Previous history of stroke or cardiovascular disease    *These are risk factors YOU HAVE CONTROL OVER.  For more information, visit .  There is now evidence that lowering the TOTAL CHOLESTEROL AND LDL CHOLESTEROL can reduce the risk of heart disease.  The American Heart Association recommends the following guidelines for the treatment of elevated cholesterol:  1.  If there is now current heart disease and less than two risk factors, TOTAL CHOLESTEROL should be less than 200 and LDL CHOLESTEROL should be less than 100. 2.  If there is  current heart disease or two or more risk factors, TOTAL CHOLESTEROL should be less than 200 and LDL CHOLESTEROL should be less than 70.  A diet low in cholesterol, saturated fat, and calories is the cornerstone of treatment for elevated cholesterol.  Cessation of smoking and exercise are also important in the management of elevated cholesterol and preventing vascular disease.  Studies have shown that 30 to 60 minutes of physical activity most days can help lower blood pressure, lower cholesterol, and keep your weight at a healthy level.  Drug therapy is used when cholesterol levels do not respond to therapeutic lifestyle changes (smoking cessation, diet, and exercise) and remains unacceptably high.  If medication is started, it is important to have you levels checked periodically to evaluate the need for further treatment options.  Thank you,    [employer] Team

## 2010-07-11 NOTE — Letter (Signed)
    at [facility] [address] [place], [place]  [address]  [place] Phone: [phone]      September 01, 2009   [facility] [address] [place], [place] [address]  RE:  LAB RESULTS  Dear  Ms. Codrington,  The following is an interpretation of your most recent lab tests.  Please take note of any instructions provided or changes to medications that have resulted from your lab work.  ELECTROLYTES:  Good - no changes needed    DIABETIC STUDIES:  Fair - schedule a follow-up appointment Blood Glucose: 111   HgbA1C: 6.7   Microalbumin/Creatinine Ratio: 9.7       I will further discuss your lab results at your next follow up appointment.     Sincerely Yours,    Dr. Thomos Lemons

## 2010-07-11 NOTE — Consult Note (Signed)
Summary: [facility]  [facility]   Imported By: Lanelle Bal 09/20/2008 13:45:27  _____________________________________________________________________  External Attachment:    Type:   Image     Comment:   External Document

## 2010-07-11 NOTE — Assessment & Plan Note (Signed)
Summary: F1Y/FKW  Medications Added ALTACE 10 MG CAPS (RAMIPRIL) 1 QD ALTACE 10 MG CAPS (RAMIPRIL) 1 once daily      Allergies Added: NKDA  Visit Type:  Follow-up Primary Provider:  Dondra Spry DO  CC:  no cardiac complaints Pt has a blood test she needs drawn if we do blood today. pt smokes apack a day.  History of Present Illness: [name] [name] is a very pleasant female who has a history of coronary disease.  She has had a prior PCI of her diagonal.  Her most recent catheterization in April of 2004 showed no obstructive disease, and her ejection fraction was normal. I last saw her in April of 2009. Her most recent Myoview was performed in June of 2009. Her ejection fraction was normal. There was breast attenuation but no ischemia. Since I last saw her she has dyspnea with exertion relieved with rest. There is no associated chest pain. There is no orthopnea, PND or pedal edema. It is worse compared to April of 2009. She does not have exertional chest pain. There is no palpitations or syncope. She is continuing to smoke.  Current Medications (verified): 1)  Altace 5 Mg Caps (Ramipril) .... Take 1 Capsule By Mouth Once A Day (Office Visit Needed For Refills) 2)  Hydrochlorothiazide 12.5 Mg Caps (Hydrochlorothiazide) .... Take 1 Capsule By Mouth Every Morning 3)  Glucophage Xr 500 Mg  Tb24 (Metformin Hcl) .... One By Mouth Two Times A Day 4)  Metoprolol Tartrate 50 Mg Tabs (Metoprolol Tartrate) .... Take 1 Tablet By Mouth Twice A Day 5)  Onetouch Test  Strp (Glucose Blood) .... Use 1 Strip Once A Day 6)  Vytorin 10-40 Mg Tabs (Ezetimibe-Simvastatin) .... Take 1 Tablet By Mouth Once A Day (Office Visit Needed No Refills Authorized After This) 7)  Wellbutrin Sr 200 Mg Tb12 (Bupropion Hcl) .... Take 3 Tablet By Mouth Once Daily 8)  Onetouch Ultrasoft Lancets   Misc (Lancets) .... Test Blood Sugar Once Daily 9)  Loratadine 10 Mg  Tabs (Loratadine) .... Take 1 Tablet By Mouth Once A Day 10)   Aspirin Low Dose 81 Mg  Tabs (Aspirin) .... Take 1 Tablet By Mouth Once A Day 11)  Multivitamins   Tabs (Multiple Vitamin) .... Take 1 Tablet By Mouth Two Times A Day 12)  Omeprazole 20 Mg Cpdr (Omeprazole) .... One By Mouth Once Daily 13)  Chantix Starting Month Pak 0.5 Mg X 11 & 1 Mg X 42 Tabs (Varenicline Tartrate) .... Take As Directed  Allergies (verified): No Known Drug Allergies  Past History:  Past Medical History: Reviewed history from 01/05/2009 and no changes required. Current Problems:  CORONARY ARTERY DISEASE (ICD-414.00) HYPERTENSION (ICD-401.9) HYPERLIPIDEMIA (ICD-272.4) CONSTIPATION (ICD-564.00) HEALTH MAINTENANCE EXAM (ICD-V70.0) OBSTRUCTIVE SLEEP APNEA (ICD-327.23) GERD (ICD-530.81) DIABETES MELLITUS, TYPE II (ICD-250.00) DEPRESSION (ICD-311) ALLERGIC RHINITIS (ICD-477.9) TOBACCO ABUSE (ICD-305.1) HOARSENESS (ICD-784.49)  Past Surgical History:  The study was done as a Taxus followup study. The patient previously underwent percutaneous stenting using a small 2.25 mm stent in May of 2003.  Following the procedure, she has done well.  She was brought back as part of a mandated followup catheterization as part of the Taxus 5 high-risk subset study.  The patient was randomized to drug eluting stent versus no drug-eluting stent.  This was done in the small-vessel category. PRESSURES: 1. Aorta:  147/76/104. Left ventricle 152/11.Marland Kitchen No gradient on pullback across the aortic valve. ANGIOGRAPHIC DATA:Ventriculography was performed the RAO projection. Because of ectopy, the ejection  fraction was not calculated.  The EF appeared in excess of 60%.No definite wall-motion abnormalities were identified.  The aortic valve appeared to open well. Well-preserved left ventricular function.. Continued patency of the previously-placed stent in the diagonal branch. Mild luminal irregularities as noted.  Arturo Morton. Riley Kill, M.D. LHCTDS/MEDQ  D:  09/11/2002  T:  09/13/2002  Job:  [id number]  TAXUS  trial, and a 2.25 x 16 mm stent was utilized.  Thisstent was used to cover both the high-grade lesion as well as some segmentaldisease distal to the high-grade stenosis site as a shorter stent would end in the diseased segment.  The 16 mm stent was then placed and taken up to about11 atmospheres.  There was marked improvement in the appearance of the artery.We then took a 2.5 mm x 12 Quantum Maverick balloon and stayed inside the stent edges and did post dilatation up to about 11 atmospheres.  This yieldedan artery of about 2.46.  There was no evidence of edge tear.Dictated by:   Arturo Morton Riley Kill, M.D. [facility] Attending Physician:  Olga Millers SandersDD:  10/27/01  Social History: Reviewed history from 08/05/2008 and no changes required. 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 Remaining systems are negative.   Vital Signs:  Patient profile:   60 year old female Height:      64 inches Weight:      181 pounds Pulse rate:   64 / minute BP sitting:   149 / 83  (left arm)  Vitals Entered By: Burnett Kanaris, CNA (January 06, 2009 8:51 AM)  Physical Exam  General:  we'll developed well nourished in no acute distress. Skin is warm and dry. Head:  HEENT is normal Neck:  supple with no bruits. No thyromegaly noted. Lungs:  clear to auscultation Heart:  regular rate and rhythm Abdomen:  soft and nontender. No masses palpated. Extremities:  no edema. Neurologic:  grossly intact.   EKG  Procedure date:  01/06/2009  Findings:      normal sinus rhythm at a rate of 63. No ST changes.  Impression & Recommendations:  Problem # 1:  DYSPNEA (ICD-786.05) This most likely represents lung disease and deconditioning.  Last Myoview showed no ischemia. We'll not pursue this further. Her updated medication list for this problem includes:    Altace 10 Mg Caps (Ramipril) .Marland Kitchen... 1 once daily    Hydrochlorothiazide 12.5 Mg Caps (Hydrochlorothiazide) .Marland Kitchen... Take 1 capsule by mouth every morning    Metoprolol Tartrate 50 Mg Tabs (Metoprolol tartrate) .Marland Kitchen... Take 1 tablet by mouth twice a day    Aspirin Low Dose 81 Mg Tabs (Aspirin) .Marland Kitchen... Take 1 tablet by mouth once a day  Problem # 2:  CORONARY ARTERY DISEASE (ICD-414.00)  Continue aspirin, beta blocker, ACE inhibitor and statin. Continue his factor modification including diet and exercise. Her updated medication list for this problem includes:    Altace 10 Mg Caps (Ramipril) .Marland Kitchen... 1 once daily    Metoprolol Tartrate 50 Mg Tabs (Metoprolol tartrate) .Marland Kitchen... Take 1 tablet by mouth twice a day    Aspirin Low Dose 81 Mg Tabs (Aspirin) .Marland Kitchen... Take 1 tablet by mouth once a day  Orders: EKG w/ Interpretation (93000)  Her updated medication list for this problem includes:  Altace 5 Mg Caps (Ramipril) .Marland Kitchen... Take 1 capsule by mouth once a day (office visit needed for refills)    Metoprolol Tartrate 50 Mg Tabs (Metoprolol tartrate) .Marland Kitchen... Take 1 tablet by mouth twice a day    Aspirin Low Dose 81 Mg Tabs (Aspirin) .Marland Kitchen... Take 1 tablet by mouth once a day  Problem # 3:  HYPERTENSION (ICD-401.9) Blood pressure elevated. Increase Altace to 10 mg p.o. daily. Check bmet in one week. Her updated medication list for this problem includes:    Altace 10 Mg Caps (Ramipril) .Marland Kitchen... 1 once daily    Hydrochlorothiazide 12.5 Mg Caps (Hydrochlorothiazide) .Marland Kitchen... Take 1 capsule by mouth every morning    Metoprolol Tartrate 50 Mg Tabs (Metoprolol tartrate) .Marland Kitchen... Take 1 tablet by mouth twice a day    Aspirin Low Dose 81 Mg Tabs (Aspirin) .Marland Kitchen... Take 1 tablet by mouth once a day  Problem # 4:  HYPERLIPIDEMIA (ICD-272.4)  Continue statin. Check lipids and liver. Her updated medication list for  this problem includes:    Vytorin 10-40 Mg Tabs (Ezetimibe-simvastatin) .Marland Kitchen... Take 1 tablet by mouth once a day (office visit needed no refills authorized after this)  Her updated medication list for this problem includes:    Vytorin 10-40 Mg Tabs (Ezetimibe-simvastatin) .Marland Kitchen... Take 1 tablet by mouth once a day (office visit needed no refills authorized after this)  Problem # 5:  DIABETES MELLITUS, TYPE II (ICD-250.00)  Her updated medication list for this problem includes:    Altace 10 Mg Caps (Ramipril) .Marland Kitchen... 1 once daily    Glucophage Xr 500 Mg Tb24 (Metformin hcl) ..... One by mouth two times a day    Aspirin Low Dose 81 Mg Tabs (Aspirin) .Marland Kitchen... Take 1 tablet by mouth once a day  Her updated medication list for this problem includes:    Altace 5 Mg Caps (Ramipril) .Marland Kitchen... Take 1 capsule by mouth once a day (office visit needed for refills)    Glucophage Xr 500 Mg Tb24 (Metformin hcl) ..... One by mouth two times a day    Aspirin Low Dose 81 Mg Tabs (Aspirin) .Marland Kitchen... Take 1 tablet by mouth once a day  Problem # 6:  TOBACCO ABUSE (ICD-305.1) Patient counseled on discontinuing for between 3-10 minutes.  Problem # 7:  OBSTRUCTIVE SLEEP APNEA (ICD-327.23)  Problem # 8:  GERD (ICD-530.81)  Her updated medication list for this problem includes:    Omeprazole 20 Mg Cpdr (Omeprazole) ..... One by mouth once daily  Her updated medication list for this problem includes:    Omeprazole 20 Mg Cpdr (Omeprazole) ..... One by mouth once daily  Patient Instructions: 1)  Your physician recommends that you schedule a follow-up appointment in: 2)  Your physician recommends that you return for lab work in:1 WEEK FOR FASTING LABS  BMET LIPID  LIVER 3)  Your physician has recommended you make the following change in your medication: INCREASE ALTACE TO 10 MG Prescriptions: ALTACE 10 MG CAPS (RAMIPRIL) 1 once daily  #30 x 11   Entered by:   Scherrie Bateman, LPN   Authorized by:   Ferman Hamming, MD, [facility]   Signed by:   Scherrie Bateman, LPN on 84/69/6295   Method used:   Electronically to        [facility] [address] [phone].* (retail)       [address]       [place], [place]  [address]       Ph: [phone]       Fax: [phone]   RxID:   [id number] ALTACE 10 MG CAPS (RAMIPRIL) 1 QD  #30 x 11   Entered by:   Scherrie Bateman, LPN   Authorized by:   Ferman Hamming, MD, [facility]   Signed by:   Scherrie Bateman, LPN on 84/13/2440   Method used:   Electronically to        [facility] [address] [phone].* (retail)       [address]       [place], [place]  [address]       Ph: [phone]       Fax: [phone]   RxID:   [id number]

## 2010-07-11 NOTE — Assessment & Plan Note (Signed)
Summary: 1 month follow up/mhf   Vital Signs:  Patient profile:   60 year old female Height:      64 inches Weight:      181.50 pounds BMI:     31.27 O2 Sat:      94 % on Room air Temp:     98.0 degrees F oral Pulse rate:   77 / minute Pulse rhythm:   regular Resp:     22 per minute BP sitting:   100 / 60  (left arm) Cuff size:   large  Vitals Entered By: Glendell Docker CMA (October 04, 2009 4:42 PM)  O2 Flow:  Room air CC: Rm 2- 1 Month Follow up , Type 2 diabetes mellitus follow-up   Primary Care Provider:  DThomos Lemons DO  CC:  Rm 2- 1 Month Follow up  and Type 2 diabetes mellitus follow-up.  History of Present Illness:  Type 2 Diabetes Mellitus Follow-Up      This is a 60 year old woman who presents for Type 2 diabetes mellitus follow-up.  The patient denies self managed hypoglycemia, hypoglycemia requiring help, and weight gain.  The patient denies the following symptoms: chest pain.  Since the last visit the patient reports good dietary compliance and compliance with medications.  victoza working well  she reports being seen at urgent care for bronchitis.  she is taking abx.  she did not start prednisone. cough getting better.  some wheezing    Allergies (verified): No Known Drug Allergies  Past History:  Past Medical History: Current Problems:  CORONARY ARTERY DISEASE (ICD-414.00) HYPERTENSION (ICD-401.9) HYPERLIPIDEMIA (ICD-272.4)   CONSTIPATION (ICD-564.00)  HEALTH MAINTENANCE EXAM (ICD-V70.0) OBSTRUCTIVE SLEEP APNEA (ICD-327.23) GERD (ICD-530.81) DIABETES MELLITUS, TYPE II (ICD-250.00) DEPRESSION (ICD-311) ALLERGIC RHINITIS (ICD-477.9) TOBACCO ABUSE (ICD-305.1) HOARSENESS (ICD-784.49)  Past Surgical History:   The study was done as a Taxus followup study. The patient previously underwent percutaneous stenting using a small 2.25 mm stent in May of 2003.  Following the procedure, she has done well.  She was brought back as part of a mandated followup  catheterization as part of the Taxus 5 high-risk subset study.  The patient was randomized to drug eluting stent versus no drug-eluting stent.  This was done in the small-vessel category. PRESSURES: 1. Aorta:  147/76/104. Left ventricle 152/11.Marland Kitchen No gradient on pullback across the aortic valve. ANGIOGRAPHIC DATA:Ventriculography was performed the RAO projection. Because of ectopy, the ejection fraction was not calculated.  The EF appeared in excess of 60%.No definite wall-motion abnormalities were identified.  The aortic valve appeared to open well. Well-preserved left ventricular function.. Continued patency of the previously-placed stent in the diagonal branch. Mild luminal irregularities as noted.  Arturo Morton. Riley Kill, M.D. LHCTDS/MEDQ  D:  09/11/2002  T:  09/13/2002  Job:  [id number]   TAXUS trial, and a 2.25 x 16 mm stent was utilized.  Thisstent was used to cover both the high-grade lesion as well as some segmentaldisease distal to the high-grade stenosis site as a shorter stent would end in the diseased segment.  The 16 mm stent was then placed and taken up to about11 atmospheres.  There was marked improvement in the appearance of the artery.We then took a 2.5 mm x 12 Quantum Maverick balloon and stayed inside the stent edges and did post dilatation up to about 11 atmospheres.  This yieldedan artery of about 2.46.  There was no evidence of edge tear.Dictated by:   Arturo Morton Riley Kill, M.D. 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   Lungs:  normal respiratory effort.  faint exp wheeze bilaterally Heart:  normal rate, regular rhythm, and no gallop.   Extremities:  no edema. Neurologic:  cranial nerves II-XII intact and gait normal.      Impression & Recommendations:  Problem # 1:  DIABETES MELLITUS, TYPE II (ICD-250.00) Assessment Improved good response to victoza.  7-8 lbs wt loss since prev visit.  The following medications were removed from the medication list:    Altace 10 Mg Caps (Ramipril) .Marland Kitchen... Take 1 tablet by mouth once a day Her updated medication list for this problem includes:    Metformin Hcl 500 Mg Xr24h-tab (Metformin hcl) ..... One by mouth three times a day    Aspirin Low Dose 81 Mg Tabs (Aspirin) .Marland Kitchen... Take 1 tablet by mouth once a day    Victoza 18 Mg/76ml Soln (Liraglutide) ..... Inject 0.6 mg once daily x  1 week, then 1.2 mg once daily    Benicar Hct 20-12.5 Mg Tabs (Olmesartan medoxomil-hctz) ..... One by mouth once daily  Labs Reviewed: Creat: 0.84 (08/30/2009)    Reviewed HgBA1c results: 6.7 (08/30/2009)  6.7 (08/05/2008)  Problem # 2:  BRONCHITIS (ICD-490) pt seen on Sat for acute bronchitis.  cough getting better.  prednisone will likely cause significant hyperglycemia.  sample of dulera provided. Patient advised to call office if symptoms persist or worsen.  Her updated medication list for this problem includes:    Augmentin 875-125 Mg Tabs (Amoxicillin-pot clavulanate) .Marland Kitchen... Take 1 tablet by mouth two times a day    Ventolin Hfa 108 (90 Base) Mcg/act Aers (Albuterol sulfate) .Marland Kitchen... 2 puffs by mouth four times a day    Dulera 200-5 Mcg/act Aero (Mometasone furo-formoterol fum) .Marland Kitchen... 2 puffs bid  Complete Medication List: 1)  Metformin Hcl 500 Mg Xr24h-tab (Metformin hcl) .... One by mouth three times a day 2)  Metoprolol Tartrate 50 Mg Tabs (Metoprolol tartrate) .... Take 1 tablet by mouth twice a day 3)  Onetouch Test Strp (Glucose blood) .... Use 1 strip once a day 4)  Vytorin 10-40 Mg Tabs (Ezetimibe-simvastatin) .... Take 1 tablet by mouth once a day 5)  Wellbutrin Sr 200 Mg Tb12 (Bupropion hcl) .... Take 3 tablet by mouth once daily 6)  Onetouch Ultrasoft Lancets Misc (Lancets)  .... Test blood sugar once daily 7)  Aspirin Low Dose 81 Mg Tabs (Aspirin) .... Take 1 tablet by mouth once a day 8)  Omeprazole 20 Mg Cpdr (Omeprazole) .... One by mouth once daily 9)  Abilify 5 Mg Tabs (Aripiprazole) .... Take 1 tablet by mouth once a day 10)  Victoza 18 Mg/17ml Soln (Liraglutide) .... Inject 0.6 mg once daily x  1 week, then 1.2 mg once daily 11)  Relion Pen Needles 31g X 8 Mm Misc (Insulin pen needle) .... Use once daily as directed 12)  Voltaren 1 % Gel (Diclofenac sodium) .... Apply three times a day 13)  Augmentin 875-125 Mg Tabs (Amoxicillin-pot clavulanate) .... Take 1 tablet by mouth two times a day 14)  Ventolin Hfa 108 (90 Base) Mcg/act Aers (Albuterol sulfate) .... 2 puffs by mouth four times a day 15)  Dulera 200-5 Mcg/act Aero (Mometasone furo-formoterol fum) .... 2 puffs bid 16)  Benicar Hct 20-12.5  Mg Tabs (Olmesartan medoxomil-hctz) .... One by mouth once daily  Patient Instructions: 1)  Please schedule a follow-up appointment in 3 months. 2)  BMP prior to visit, ICD-9:  401.9 3)  HbgA1C prior to visit, ICD-9: 250.00 4)  AST, ALT, FLP:  272.4 5)  Please return for lab work one (1) week before your next appointment.  Prescriptions: BENICAR HCT 20-12.5 MG TABS (OLMESARTAN MEDOXOMIL-HCTZ) one by mouth once daily  #30 x 3   Entered and Authorized by:   D. Thomos Lemons DO   Signed by:   D. Thomos Lemons DO on 10/04/2009   Method used:   Electronically to        [facility] [address] [phone].* (retail)       [address]       [place], [place]  [address]       Ph: [phone]       Fax: [phone]   RxID:   [id number]   Current Allergies (reviewed today): No known allergies

## 2010-07-11 NOTE — Progress Notes (Signed)
Summary: refill: Victoza, pen needle  Phone Note Refill Request Message from:  Fax from  [facility] [address] on 12/27/09 3:17pm  Refills Requested: Medication #1:  VICTOZA 18 MG/3ML SOLN inject 0.6 mg once daily x  1 week   Dosage confirmed as above?Dosage Confirmed   Supply Requested: 1 month   Last Refilled: 11/23/2009  Medication #2:  RELION PEN NEEDLES 31G X 8 MM MISC use once daily as directed   Dosage confirmed as above?Dosage Confirmed   Supply Requested: 1 month Next Appointment Scheduled: 01/03/10  Dr Artist Pais Initial call taken by: Mervin Kung CMA (AAMA),  December 28, 2009 1:50 PM    Prescriptions: RELION PEN NEEDLES 31G X 8 MM MISC (INSULIN PEN NEEDLE) use once daily as directed  #100 x 0   Entered by:   Mervin Kung CMA (AAMA)   Authorized by:   D. Thomos Lemons DO   Signed by:   Mervin Kung CMA (AAMA) on 12/28/2009   Method used:   Electronically to        [facility] [address] [phone].* (retail)       [address]       [place], [place]  [address]       Ph: [phone]       Fax: [phone]   RxID:   [id number] 32 MG/3ML SOLN (LIRAGLUTIDE) inject 0.6 mg once daily x  1 week, then 1.2 mg once daily  #1 x 2   Entered by:   Mervin Kung CMA (AAMA)   Authorized by:   D. Thomos Lemons DO   Signed by:   Mervin Kung CMA (AAMA) on 12/28/2009   Method used:   Electronically to        [facility] [address] [phone].* (retail)       [address]       [place], [place]  [address]       Ph: [phone]       Fax: [phone]   RxID:   [id number]

## 2010-07-11 NOTE — Letter (Signed)
Summary: Appointment - Reschedule  [facility], Main Office  [address]   [place], [place] [address]   Phone: [phone]  Fax: [phone]     November 16, 2009 MRN: [id number]   [facility] [address] [place], [place]  [address]   Dear Ms. Caban,   Due to a change in our office schedule, your appointment on 11/24/09 at 11:00 must be changed.  It is very important that we reach you to reschedule this appointment. We look forward to participating in your health care needs. Please contact us at the number listed above at your earliest convenience to reschedule this appointment.     Sincerely, [facility] Scheduling Team

## 2010-07-11 NOTE — Progress Notes (Signed)
Summary: [facility] Refill  Phone Note Refill Request Message from:  Fax from Pharmacy on February 21, 2009 10:03 AM  Refills Requested: Medication #1:  HYDROCHLOROTHIAZIDE 12.5 MG CAPS Take 1 capsule by mouth every morning   Dosage confirmed as above?Dosage Confirmed   Brand Name Necessary? No   Supply Requested: 1 month   Last Refilled: 01/15/2009  Method Requested: Electronic Next Appointment Scheduled: No Future Appointments on file Initial call taken by: Glendell Docker CMA,  February 21, 2009 10:04 AM    Prescriptions: HYDROCHLOROTHIAZIDE 12.5 MG CAPS (HYDROCHLOROTHIAZIDE) Take 1 capsule by mouth every morning  #30 x 3   Entered by:   Glendell Docker CMA   Authorized by:   D. Thomos Lemons DO   Signed by:   Glendell Docker CMA on 02/21/2009   Method used:   Electronically to        [facility] [address] [phone].* (retail)       [address]       [place], [place]  [address]       Ph: [phone]       Fax: [phone]   RxID:   [id number]

## 2010-07-11 NOTE — Miscellaneous (Signed)
Summary: Metformin  Clinical Lists Changes  Medications: Changed medication from METFORMIN HCL 500 MG TABS (METFORMIN HCL) Take 1 tablet by mouth twice a day to METFORMIN HCL 500 MG TABS (METFORMIN HCL) Take 1 tablet by mouth twice a day no additional refills without office visit - Signed Rx of METFORMIN HCL 500 MG TABS (METFORMIN HCL) Take 1 tablet by mouth twice a day no additional refills without office visit;  #60 x 0;  Signed;  Entered by: Glendell Docker;  Authorized by: D. Thomos Lemons DO;  Method used: Electronic    Prescriptions: METFORMIN HCL 500 MG TABS (METFORMIN HCL) Take 1 tablet by mouth twice a day no additional refills without office visit  #60 x 0   Entered by:   Glendell Docker   Authorized by:   D. Thomos Lemons DO   Signed by:   Glendell Docker on 08/22/2007   Method used:   Electronically sent to ...       [facility]  [place] #[id number].*       [address]       [place], [place]  [address]       Ph: [phone]       Fax: [phone]   RxID:   [id number]    Current Allergies: No known allergies

## 2010-07-11 NOTE — Letter (Signed)
Summary: Patient Notice- Polyp Results  [facility]  [address] [place], [place] [address]   Phone: [phone]  Fax: [phone]        Nov 07, 2007 MRN: [id number]    [facility] [address] [place], [place]  [address]    Dear Ms. Belli,  I am pleased to inform you that the colon polyp(s) removed during your recent colonoscopy was (were) found to be benign (no cancer detected) upon pathologic examination.  I recommend you have a repeat colonoscopy examination in 1_ years to look for recurrent polyps, as having colon polyps increases your risk for having recurrent polyps or even colon cancer in the future.  Should you develop new or worsening symptoms of abdominal pain, bowel habit changes or bleeding from the rectum or bowels, please schedule an evaluation with either your primary care physician or with me.  Additional information/recommendations:  __ No further action with gastroenterology is needed at this time. Please      follow-up with your primary care physician for your other healthcare      needs.  __ Please call [phone] to schedule a return visit to review your      situation.  __ Please keep your follow-up visit as already scheduled.  _x_ Continue treatment plan as outlined the day of your exam.  Please call us if you are having persistent problems or have questions about your condition that have not been fully answered at this time.  Sincerely,  Mardella Layman MD [facility]  This letter has been electronically signed by your physician.

## 2010-07-11 NOTE — Progress Notes (Signed)
Summary: Phone note  Phone Note From Pharmacy   Caller: [facility]  [place] #[id number].* Reason for Call: Needs renewal Summary of Call: RX refill needed for Metformin. Initial call taken by: Darra Lis RMA,  June 15, 2008 10:20 AM  Follow-up for Phone Call        Rx for Metformin 500 XR #60 one tablet by mouth with 3 refills called to [facility] [phone]. Message left for patient to pick up Rx at the pharmacy. Follow-up by: Darra Lis RMA,  June 15, 2008 10:24 AM      Appended Document: Phone note Patient called to say she is taking Metformin 750mg  Not Metformin 500. Metformin 500mg  XR canceled. Glucophage Xr 750 Mg Tb24 (Metformin hcl) .... One by mouth two times a day #60 with three refills called to [facility] [place] [phone].

## 2010-07-11 NOTE — Letter (Signed)
Summary: Recall Colonoscopy Letter  [facility]  [address] [place], [place] [address]   Phone: [phone]  Fax: [phone]      September 15, 2008 MRN: [id number]   [facility] [address] [place], [place]  [address]   Dear Ms. Gierke,   According to your medical record, it is time for you to schedule a Colonoscopy. The American Cancer Society recommends this procedure as a method to detect early colon cancer. Patients with a family history of colon cancer, or a personal history of colon polyps or inflammatory bowel disease are at increased risk.  This letter has beeen generated based on the recommendations made at the time of your procedure. If you feel that in your particular situation this may no longer apply, please contact our office.  Please call our office at [phone] to schedule this appointment or to update your records at your earliest convenience.  Thank you for cooperating with [place] to provide you with the very best care possible.   Sincerely,  Vania Rea. Jarold Motto, M.D.  [facility] [phone]

## 2010-07-11 NOTE — Assessment & Plan Note (Signed)
Summary: f/u - jr   Vital Signs:  Patient profile:   60 year old female Weight:      181 pounds BMI:     31.18 Temp:     97.9 degrees F oral Pulse rate:   78 / minute Pulse rhythm:   regular Resp:     18 per minute BP sitting:   110 / 60  (left arm) Cuff size:   large  Vitals Entered By: Glendell Docker CMA (January 11, 2009 2:57 PM)  Primary Care Provider:  Dondra Spry DO  CC:  Follow up disease management and Type 2 diabetes mellitus follow-up.  History of Present Illness: Follow up disease  management  Type 2 Diabetes Mellitus Follow-Up      This is a 60 year old woman who presents for Type 2 diabetes mellitus follow-up.  The patient denies self managed hypoglycemia and hypoglycemia requiring help.  The patient denies the following symptoms: chest pain.  Since the last visit the patient reports poor dietary compliance, compliance with medications, and not monitoring blood glucose.    Hypertension-stable  Patient complains of intermittent pains in her right wrist/hand. She is right-handed. Symptoms worse with grasping items. Her symptoms are worse in the thumb and first finger.  Preventive Screening-Counseling & Management  Alcohol-Tobacco     Alcohol drinks/day: 1 per month     Alcohol type: all     Alcohol Counseling: not indicated; use of alcohol is not excessive or problematic     Smoking Status: current     Packs/Day: 1.0     Year Quit: 1965     Tobacco Counseling: to quit use of tobacco products  Caffeine-Diet-Exercise     Caffeine use/day: 3 beverages daily     Caffeine Counseling: decrease use of caffeine     Does Patient Exercise: no  Allergies (verified): No Known Drug Allergies  Past History:  Past Medical History: Current Problems:  CORONARY ARTERY DISEASE (ICD-414.00) HYPERTENSION (ICD-401.9) HYPERLIPIDEMIA (ICD-272.4)  CONSTIPATION (ICD-564.00) HEALTH MAINTENANCE EXAM (ICD-V70.0) OBSTRUCTIVE SLEEP APNEA (ICD-327.23) GERD  (ICD-530.81) DIABETES MELLITUS, TYPE II (ICD-250.00) DEPRESSION (ICD-311) ALLERGIC RHINITIS (ICD-477.9) TOBACCO ABUSE (ICD-305.1) HOARSENESS (ICD-784.49)  Social History: Packs/Day:  1.0 Caffeine use/day:  3 beverages daily  Review of Systems      See HPI  Physical Exam  General:  alert, well-developed, and well-nourished.   Neck:  supple with no bruits. No thyromegaly noted. Lungs:  clear to auscultation Heart:  regular rate and rhythm Extremities:  no edema.  Diabetes Management Exam:    Foot Exam (with socks and/or shoes not present):       Inspection:          Left foot: normal          Right foot: normal   Impression & Recommendations:  Problem # 1:  HOARSENESS (WGN-562.13) Patient evaluated by [facility].  Evaluation negative for throat cancer. Patient advised to decrease caffeine use and discontinue smoking.  Problem # 2:  HYPERTENSION (ICD-401.9) Assessment: Improved Well controlled.  Maintain current medication regimen.  Her updated medication list for this problem includes:    Altace 10 Mg Caps (Ramipril) .Marland Kitchen... 1 once daily    Hydrochlorothiazide 12.5 Mg Caps (Hydrochlorothiazide) .Marland Kitchen... Take 1 capsule by mouth every morning    Metoprolol Tartrate 50 Mg Tabs (Metoprolol tartrate) .Marland Kitchen... Take 1 tablet by mouth twice a day  BP today: 110/60 Prior BP: 149/83 (01/06/2009)  Labs Reviewed: K+: 3.7 (08/05/2008) Creat: : 0.7 (08/05/2008)  Chol: 119 (09/25/2007)   HDL: 42.2 (09/25/2007)   LDL: 57 (09/25/2007)   TG: 98 (09/25/2007)  Problem # 3:  DIABETES MELLITUS, TYPE II (ICD-250.00) Refer to nutritionist for additional dietary counseling. We discussed adding [place].   Her updated medication list for this problem includes:    Altace 10 Mg Caps (Ramipril) .Marland Kitchen... 1 once daily    Metformin Hcl 500 Mg Xr24h-tab (Metformin hcl) ..... One by mouth three times a day    Aspirin Low Dose 81 Mg Tabs (Aspirin) .Marland Kitchen... Take 1 tablet by mouth once a  day  Orders: Nutrition Referral (Nutrition)  Labs Reviewed: Creat: 0.7 (08/05/2008)    Reviewed HgBA1c results: 6.7 (08/05/2008)  6.5 (09/25/2007)  Problem # 4:  WRIST PAIN, RIGHT (ZOX-096.04) Patient with intermittent right wrist discomfort. I suspect carpal tunnel syndrome.  Use wrist splint.  Ortho referral if symptoms worsen  Complete Medication List: 1)  Altace 10 Mg Caps (Ramipril) .Marland Kitchen.. 1 once daily 2)  Hydrochlorothiazide 12.5 Mg Caps (Hydrochlorothiazide) .... Take 1 capsule by mouth every morning 3)  Metformin Hcl 500 Mg Xr24h-tab (Metformin hcl) .... One by mouth three times a day 4)  Metoprolol Tartrate 50 Mg Tabs (Metoprolol tartrate) .... Take 1 tablet by mouth twice a day 5)  Onetouch Test Strp (Glucose blood) .... Use 1 strip once a day 6)  Vytorin 10-40 Mg Tabs (Ezetimibe-simvastatin) .... Take 1 tablet by mouth once a day (office visit needed no refills authorized after this) 7)  Wellbutrin Sr 200 Mg Tb12 (Bupropion hcl) .... Take 3 tablet by mouth once daily 8)  Onetouch Ultrasoft Lancets Misc (Lancets) .... Test blood sugar once daily 9)  Loratadine 10 Mg Tabs (Loratadine) .... Take 1 tablet by mouth once a day 10)  Aspirin Low Dose 81 Mg Tabs (Aspirin) .... Take 1 tablet by mouth once a day 11)  Multivitamins Tabs (Multiple vitamin) .... Take 1 tablet by mouth two times a day 12)  Omeprazole 20 Mg Cpdr (Omeprazole) .... One by mouth once daily  Patient Instructions: 1)  Follow up appointment in 3 months. 2)  BMET - 250.00 3)  A1c - 250.00 4)  Blood work before 1 week before office visit. 5)  Use wrist splint at night. Prescriptions: VYTORIN 10-40 MG TABS (EZETIMIBE-SIMVASTATIN) Take 1 tablet by mouth once a day (OFFICE VISIT NEEDED NO REFILLS AUTHORIZED AFTER THIS)  #30 x 5   Entered and Authorized by:   D. Thomos Lemons DO   Signed by:   D. Thomos Lemons DO on 01/11/2009   Method used:   Electronically to        [facility] [address] [phone].* (retail)        [address]       [place], [place]  [address]       Ph: [phone]       Fax: [phone]   RxID:   [id number] METFORMIN HCL 500 MG XR24H-TAB (METFORMIN HCL) one by mouth three times a day  #90 x 3   Entered and Authorized by:   D. Thomos Lemons DO   Signed by:   D. Thomos Lemons DO on 01/11/2009   Method used:   Electronically to        [facility] [address] [phone].* (retail)       [address] [facility]       [place], [place]  [address]       Ph: [phone]       Fax: [phone]   RxID:   [id number]   Current Allergies (reviewed today): No known allergies    Patient Instructions: 1)  Follow up appointment in 3 months. 2)  BMET - 250.00 3)  A1c - 250.00 4)  Blood work before 1 week before office visit. 5)  Use wrist splint at night.

## 2010-07-11 NOTE — Progress Notes (Signed)
Summary: Altace RFd  Phone Note Refill Request  on December 21, 2008 11:34 AM  Refills Requested: Medication #1:  ALTACE 5 MG CAPS Take 1 capsule by mouth once a day (OFFICE VISIT NEEDED FOR REFILLS) [name] [name]. is completely out of this med and has a appt. to see Dr. Artist Pais 01/11/09 If there is any problem please call [name] [name]. back at [phone] & [phone]  Initial call taken by: Michaelle Copas,  December 21, 2008 11:33 AM    Prescriptions: ALTACE 5 MG CAPS (RAMIPRIL) Take 1 capsule by mouth once a day (OFFICE VISIT NEEDED FOR REFILLS)  #30 x 0   Entered and Authorized by:   Seymour Bars DO   Signed by:   Seymour Bars DO on 12/21/2008   Method used:   Electronically to        [facility] [address] [phone].* (retail)       [address]       [place], [place]  [address]       Ph: [phone]       Fax: [phone]   RxID:   [id number]

## 2010-07-11 NOTE — Progress Notes (Signed)
Summary: Metoprolol  Phone Note Refill Request Message from:  Fax from Pharmacy on March 27, 2010 8:24 AM  Refills Requested: Medication #1:  METOPROLOL TARTRATE 50 MG TABS Take 1 tablet by mouth twice a day   Dosage confirmed as above?Dosage Confirmed   Brand Name Necessary? No   Supply Requested: 1 month   Last Refilled: 02/11/2010  Method Requested: Electronic Next Appointment Scheduled: 03-30-10 Lab Initial call taken by: Roselle Locus,  March 27, 2010 8:24 AM    Prescriptions: METOPROLOL TARTRATE 50 MG TABS (METOPROLOL TARTRATE) Take 1 tablet by mouth twice a day  #60 x 3   Entered by:   Glendell Docker CMA   Authorized by:   D. Thomos Lemons DO   Signed by:   Glendell Docker CMA on 03/27/2010   Method used:   Electronically to        [facility] [address] [phone].* (retail)       [address]       [place], [place]  [address]       Ph: [phone]       Fax: [phone]   RxID:   [id number]

## 2010-07-11 NOTE — Assessment & Plan Note (Signed)
Summary: 4:15/PER CHECK OUT/OK PER Sanjuanita/SAF    Primary Provider:  Dondra Spry DO  CC:  check up.  History of Present Illness: [name] [name] is a very pleasant female who has a history of coronary disease.  She has had a prior PCI of her diagonal.  Her most recent catheterization in April of 2004 showed no obstructive disease, and her ejection fraction was normal. I last saw her in April of 2009. Her most recent Myoview was performed in June of 2009. Her ejection fraction was normal. There was breast attenuation but no ischemia. I last saw her in July of this year. Since then she does have dyspnea on exertion relieved with rest. There is no associated chest pain. There is no orthopnea, PND or pedal edema. There is no palpitations or syncope. She does continue to smoke.  Current Medications (verified): 1)  Altace 10 Mg Caps (Ramipril) .Marland Kitchen.. 1 Once Daily 2)  Hydrochlorothiazide 12.5 Mg Caps (Hydrochlorothiazide) .... Take 1 Capsule By Mouth Every Morning 3)  Metformin Hcl 500 Mg Xr24h-Tab (Metformin Hcl) .... One By Mouth Three Times A Day 4)  Metoprolol Tartrate 50 Mg Tabs (Metoprolol Tartrate) .... Take 1 Tablet By Mouth Twice A Day 5)  Onetouch Test  Strp (Glucose Blood) .... Use 1 Strip Once A Day 6)  Vytorin 10-40 Mg Tabs (Ezetimibe-Simvastatin) .... Take 1 Tablet By Mouth Once A Day (Office Visit Needed No Refills Authorized After This) 7)  Wellbutrin Sr 200 Mg Tb12 (Bupropion Hcl) .... Take 3 Tablet By Mouth Once Daily 8)  Onetouch Ultrasoft Lancets   Misc (Lancets) .... Test Blood Sugar Once Daily 9)  Aspirin Low Dose 81 Mg  Tabs (Aspirin) .... Take 1 Tablet By Mouth Once A Day 10)  Omeprazole 20 Mg Cpdr (Omeprazole) .... One By Mouth Once Daily  Allergies: No Known Drug Allergies  Past History:  Past Medical History: Reviewed history from 01/11/2009 and no changes required. Current Problems:  CORONARY ARTERY DISEASE (ICD-414.00) HYPERTENSION (ICD-401.9) HYPERLIPIDEMIA (ICD-272.4)   CONSTIPATION (ICD-564.00) HEALTH MAINTENANCE EXAM (ICD-V70.0) OBSTRUCTIVE SLEEP APNEA (ICD-327.23) GERD (ICD-530.81) DIABETES MELLITUS, TYPE II (ICD-250.00) DEPRESSION (ICD-311) ALLERGIC RHINITIS (ICD-477.9) TOBACCO ABUSE (ICD-305.1) HOARSENESS (ICD-784.49)  Social History: Reviewed history from 08/05/2008 and no changes required. 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 Remaining systems are negative.   Vital Signs:  Patient profile:   60 year old female Height:      64 inches Weight:      186 pounds BMI:     32.04 Pulse rate:   63 / minute Resp:     14 per minute BP sitting:   143 / 79  (left arm)  Vitals Entered By: Kem Parkinson (March 07, 2009 4:16 PM)  Physical Exam  General:  well-developed well-nourished in no acute distress. Skin is warm and dry. Head:  HEENT is normal. Neck:  supple with no bruits. No thyromegaly. Lungs:  diminished breath sounds throughout. Heart:  regular rate and rhythm Abdomen:  soft and nontender. No masses palpated. Extremities:  no edema. Neurologic:  grossly intact.   Impression & Recommendations:  Problem # 1:  PURE HYPERCHOLESTEROLEMIA (ICD-272.0) Continue statin. Her updated medication list for this problem includes:    Vytorin 10-40 Mg Tabs (Ezetimibe-simvastatin) .Marland Kitchen... Take 1  tablet by mouth once a day (office visit needed no refills authorized after this)  Problem # 2:  ESSENTIAL HYPERTENSION, BENIGN (ICD-401.1) Blood pressure improved. I've asked her to follow this at home. If her systolic continues to be greater than 130 or her diastolic greater than 85 she will contact does and we will increase her medications as needed. Her updated  medication list for this problem includes:    Altace 10 Mg Caps (Ramipril) .Marland Kitchen... 1 once daily    Hydrochlorothiazide 12.5 Mg Caps (Hydrochlorothiazide) .Marland Kitchen... Take 1 capsule by mouth every morning    Metoprolol Tartrate 50 Mg Tabs (Metoprolol tartrate) .Marland Kitchen... Take 1 tablet by mouth twice a day    Aspirin Low Dose 81 Mg Tabs (Aspirin) .Marland Kitchen... Take 1 tablet by mouth once a day  Problem # 3:  DYSPNEA (ICD-786.05) Most likely due to COPD and deconditioning. Her updated medication list for this problem includes:    Altace 10 Mg Caps (Ramipril) .Marland Kitchen... 1 once daily    Hydrochlorothiazide 12.5 Mg Caps (Hydrochlorothiazide) .Marland Kitchen... Take 1 capsule by mouth every morning    Metoprolol Tartrate 50 Mg Tabs (Metoprolol tartrate) .Marland Kitchen... Take 1 tablet by mouth twice a day    Aspirin Low Dose 81 Mg Tabs (Aspirin) .Marland Kitchen... Take 1 tablet by mouth once a day  Problem # 4:  CORONARY ARTERY DISEASE (ICD-414.00) Continue aspirin, beta blocker, ACE inhibitor and statin. Her updated medication list for this problem includes:    Altace 10 Mg Caps (Ramipril) .Marland Kitchen... 1 once daily    Metoprolol Tartrate 50 Mg Tabs (Metoprolol tartrate) .Marland Kitchen... Take 1 tablet by mouth twice a day    Aspirin Low Dose 81 Mg Tabs (Aspirin) .Marland Kitchen... Take 1 tablet by mouth once a day  Problem # 5:  OBSTRUCTIVE SLEEP APNEA (ICD-327.23)  Problem # 6:  GERD (ICD-530.81)  Her updated medication list for this problem includes:    Omeprazole 20 Mg Cpdr (Omeprazole) ..... One by mouth once daily  Problem # 7:  DIABETES MELLITUS, TYPE II (ICD-250.00)  Her updated medication list for this problem includes:    Altace 10 Mg Caps (Ramipril) .Marland Kitchen... 1 once daily    Metformin Hcl 500 Mg Xr24h-tab (Metformin hcl) ..... One by mouth three times a day    Aspirin Low Dose 81 Mg Tabs (Aspirin) .Marland Kitchen... Take 1 tablet by mouth once a day  Problem # 8:  TOBACCO ABUSE (ICD-305.1) Patient counseled on discontinuing for between 3-10 minutes.  Patient Instructions: 1)  Your  physician recommends that you schedule a follow-up appointment in: 9 MONTHS

## 2010-07-11 NOTE — Miscellaneous (Signed)
Summary: Medication Refill  Clinical Lists Changes  Medications: Changed medication from VYTORIN 10-40 MG TABS (EZETIMIBE-SIMVASTATIN) Take 1 tablet by mouth once a day (Office visit needed for refills) to VYTORIN 10-40 MG TABS (EZETIMIBE-SIMVASTATIN) Take 1 tablet by mouth once a day (OFFICE VISIT NEEDED NO REFILLS AUTHORIZED AFTER THIS) - Signed Changed medication from ALTACE 5 MG CAPS (RAMIPRIL) Take 1 capsule by mouth once a day to ALTACE 5 MG CAPS (RAMIPRIL) Take 1 capsule by mouth once a day (OFFICE VISIT NEEDED FOR REFILLS) - Signed Rx of VYTORIN 10-40 MG TABS (EZETIMIBE-SIMVASTATIN) Take 1 tablet by mouth once a day (OFFICE VISIT NEEDED NO REFILLS AUTHORIZED AFTER THIS);  #30 x 0;  Signed;  Entered by: Glendell Docker CMA;  Authorized by: D. Thomos Lemons DO;  Method used: Electronically to [facility] [phone].*, [address], [place], [place], [place]  [address], Ph: [phone], Fax: [phone] Rx of ALTACE 5 MG CAPS (RAMIPRIL) Take 1 capsule by mouth once a day (OFFICE VISIT NEEDED FOR REFILLS);  #30 x 0;  Signed;  Entered by: Glendell Docker CMA;  Authorized by: D. Thomos Lemons DO;  Method used: Electronically to [facility]  [address] [phone].*, [address], [place], [place], [place]  [address], Ph: [phone], Fax: [phone]    Prescriptions: ALTACE 5 MG CAPS (RAMIPRIL) Take 1 capsule by mouth once a day (OFFICE VISIT NEEDED FOR REFILLS)  #30 x 0   Entered by:   Glendell Docker CMA   Authorized by:   D. Thomos Lemons DO   Signed by:   Glendell Docker CMA on 07/02/2008   Method used:   Electronically to        [facility] [address] [phone].* (retail)       [address]       [place], [place]  [address]       Ph: [phone]       Fax: [phone]   RxID:   [id number] VYTORIN 10-40 MG TABS (EZETIMIBE-SIMVASTATIN) Take 1 tablet by mouth once a day (OFFICE VISIT NEEDED NO REFILLS AUTHORIZED AFTER THIS)  #30 x 0   Entered  by:   Glendell Docker CMA   Authorized by:   D. Thomos Lemons DO   Signed by:   Glendell Docker CMA on 07/02/2008   Method used:   Electronically to        [facility] [address] [phone].* (retail)       [address]       [place], [place]  [address]       Ph: [phone]       Fax: [phone]   RxID:   [id number]

## 2010-07-11 NOTE — Progress Notes (Signed)
Summary: Metformin change  Phone Note Call from Patient Call back at Home Phone [phone] Call back at Work Phone [phone] Call back at ext [phone]   Summary of Call: Patient is requesting a call about ? about meds. Initial call taken by: Lamar Sprinkles,  Oct 28, 2007 6:34 PM  Follow-up for Phone Call        Patient wanted to let MD know that she has been feeling "spacey" since starting the new 750mg  two times a day dose of Metformin. She has not taken it today and feel fine. She also is not able to report blood sugar because she just got her meter back and has not checked it. Please advise thanks. Follow-up by: Rock Nephew CMA,  Oct 29, 2007 9:28 AM  Additional Follow-up for Phone Call Additional follow up Details #1::        resume lower dose of metformin  see rx    Additional Follow-up for Phone Call Additional follow up Details #2::    Pt informed  Follow-up by: Lamar Sprinkles,  Oct 30, 2007 10:40 AM  New/Updated Medications: GLUCOPHAGE XR 500 MG  TB24 (METFORMIN HCL) one by mouth two times a day   Prescriptions: GLUCOPHAGE XR 500 MG  TB24 (METFORMIN HCL) one by mouth two times a day  #60 x 5   Entered and Authorized by:   D. Thomos Lemons DO   Signed by:   D. Thomos Lemons DO on 10/29/2007   Method used:   Electronically sent to ...       [facility]  [place] #[id number].*       [address]       [place], [place]  [address]       Ph: [phone]       Fax: [phone]   RxID:   [id number]

## 2010-07-11 NOTE — Progress Notes (Signed)
Summary: Vytorin  Phone Note Refill Request Message from:  Fax from Pharmacy on December 31, 2008 2:40 PM  Refills Requested: Medication #1:  VYTORIN 10-40 MG TABS Take 1 tablet by mouth once a day (OFFICE VISIT NEEDED NO REFILLS AUTHORIZED AFTER THIS)   Dosage confirmed as above?Dosage Confirmed   Brand Name Necessary? No   Supply Requested: 1 month   Last Refilled: 11/13/2008  Method Requested: Electronic Next Appointment Scheduled: 01/11/2009 Initial call taken by: Glendell Docker CMA,  December 31, 2008 2:41 PM    Prescriptions: VYTORIN 10-40 MG TABS (EZETIMIBE-SIMVASTATIN) Take 1 tablet by mouth once a day (OFFICE VISIT NEEDED NO REFILLS AUTHORIZED AFTER THIS)  #30 x 2   Entered by:   Glendell Docker CMA   Authorized by:   D. Thomos Lemons DO   Signed by:   Glendell Docker CMA on 12/31/2008   Method used:   Electronically to        [facility] [address] [phone].* (retail)       [address]       [place], [place]  [address]       Ph: [phone]       Fax: [phone]   RxID:   [id number]

## 2010-07-11 NOTE — Assessment & Plan Note (Signed)
Summary: F8M/DM  Medications Added WELLBUTRIN SR 150 MG XR12H-TAB (BUPROPION HCL) 3 tabs once daily ASPIRIN 81 MG TBEC (ASPIRIN) Take one tablet by mouth daily MULTIVITAMINS   TABS (MULTIPLE VITAMIN) once daily      Allergies Added: NKDA  Visit Type:  Follow-up Primary Provider:  Dondra Spry DO   History of Present Illness: [name] is a very pleasant female who has a history of coronary disease.  She has had a prior PCI of her diagonal.  Her most recent catheterization in April of 2004 showed no obstructive disease, and her ejection fraction was normal. I last saw her in April of 2009. Her most recent Myoview was performed in June of 2009. Her ejection fraction was normal. There was breast attenuation but no ischemia. I last saw her in Sept of 2010. Since then she does have dyspnea on exertion relieved with rest. There is no associated chest pain. There is no orthopnea, PND or pedal edema. There is no palpitations or syncope. She does continue to smoke.   Current Medications (verified): 1)  Metformin Hcl 500 Mg Xr24h-Tab (Metformin Hcl) .... One By Mouth Three Times A Day 2)  Metoprolol Tartrate 50 Mg Tabs (Metoprolol Tartrate) .... Take 1 Tablet By Mouth Twice A Day 3)  Onetouch Test  Strp (Glucose Blood) .... Use 1 Strip Once A Day 4)  Vytorin 10-40 Mg Tabs (Ezetimibe-Simvastatin) .... Take 1 Tablet By Mouth Once A Day 5)  Wellbutrin Sr 150 Mg Xr12h-Tab (Bupropion Hcl) .... 3 Tabs Once Daily 6)  Onetouch Ultrasoft Lancets   Misc (Lancets) .... Test Blood Sugar Once Daily 7)  Aspirin Low Dose 81 Mg  Tabs (Aspirin) .... Take 1 Tablet By Mouth Once A Day 8)  Omeprazole 20 Mg Cpdr (Omeprazole) .... One By Mouth Once Daily 9)  Abilify 5 Mg Tabs (Aripiprazole) .... Take 1 Tablet By Mouth Once A Day 10)  Victoza 18 Mg/44ml Soln (Liraglutide) .... Inject 0.6 Mg Once Daily X  1 Week, Then 1.2 Mg Once Daily 11)  Relion Pen Needles 31g X 8 Mm Misc (Insulin Pen Needle) .... Use Once Daily As  Directed 12)  Benicar Hct 20-12.5 Mg Tabs (Olmesartan Medoxomil-Hctz) .... One By Mouth Once Daily 13)  Aspirin 81 Mg Tbec (Aspirin) .... Take One Tablet By Mouth Daily 14)  Multivitamins   Tabs (Multiple Vitamin) .... Once Daily  Allergies (verified): No Known Drug Allergies  Past History:  Past Medical History: CORONARY ARTERY DISEASE (ICD-414.00) HYPERTENSION (ICD-401.9) HYPERLIPIDEMIA (ICD-272.4)   OBSTRUCTIVE SLEEP APNEA (ICD-327.23) GERD (ICD-530.81) DIABETES MELLITUS, TYPE II (ICD-250.00) DEPRESSION (ICD-311) ALLERGIC RHINITIS (ICD-477.9)  Social History: Reviewed history from 10/04/2009 and no changes required. 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 Remaining systems are negative.   Vital Signs:  Patient profile:   60 year old female Height:      64 inches Weight:      176 pounds Pulse rate:   72 / minute BP sitting:   102 / 70  (left arm)  Vitals Entered By: Laurance Flatten CMA (January 03, 2010 9:56 AM)  Physical Exam  General:  Well-developed well-nourished in no acute distress.  Skin is warm and dry.  HEENT is normal.  Neck is supple. No thyromegaly.  Chest is clear to auscultation with normal expansion.  Cardiovascular exam is regular rate and rhythm.  Abdominal exam nontender or distended. No masses palpated. Extremities show no edema. neuro grossly intact    EKG  Procedure date:  01/03/2010  Findings:      Sinus rate at 72. Axis normal. No significant ST changes.  Impression & Recommendations:  Problem # 1:  PURE HYPERCHOLESTEROLEMIA (ICD-272.0) Continue statin. Lipids and liver monitored by primary care. Her updated medication list for this problem includes:     Vytorin 10-40 Mg Tabs (Ezetimibe-simvastatin) .Marland Kitchen... Take 1 tablet by mouth once a day  Problem # 2:  ESSENTIAL HYPERTENSION, BENIGN (ICD-401.1) Blood pressure controlled on present medications. Will continue. Potassium and renal function monitored by primary care. Her updated medication list for this problem includes:    Metoprolol Tartrate 50 Mg Tabs (Metoprolol tartrate) .Marland Kitchen... Take 1 tablet by mouth twice a day    Aspirin Low Dose 81 Mg Tabs (Aspirin) .Marland Kitchen... Take 1 tablet by mouth once a day    Benicar Hct 20-12.5 Mg Tabs (Olmesartan medoxomil-hctz) ..... One by mouth once daily    Aspirin 81 Mg Tbec (Aspirin) .Marland Kitchen... Take one tablet by mouth daily  Problem # 3:  CORONARY ARTERY DISEASE (ICD-414.00) Continue aspirin, beta blocker and statin. Continue risk factor modification. Plan repeat Myoview when she returns in one year. Her updated medication list for this problem includes:    Metoprolol Tartrate 50 Mg Tabs (Metoprolol tartrate) .Marland Kitchen... Take 1 tablet by mouth twice a day    Aspirin Low Dose 81 Mg Tabs (Aspirin) .Marland Kitchen... Take 1 tablet by mouth once a day    Aspirin 81 Mg Tbec (Aspirin) .Marland Kitchen... Take one tablet by mouth daily  Orders: EKG w/ Interpretation (93000)  Problem # 4:  DIABETES MELLITUS, TYPE II (ICD-250.00) Management per primary care. Her updated medication list for this problem includes:    Metformin Hcl 500 Mg Xr24h-tab (Metformin hcl) ..... One by mouth three times a day    Aspirin Low Dose 81 Mg Tabs (Aspirin) .Marland Kitchen... Take 1 tablet by mouth once a day    Victoza 18 Mg/53ml Soln (Liraglutide) ..... Inject 0.6 mg once daily x  1 week, then 1.2 mg once daily    Benicar Hct 20-12.5 Mg Tabs (Olmesartan medoxomil-hctz) ..... One by mouth once daily    Aspirin 81 Mg Tbec (Aspirin) .Marland Kitchen... Take one tablet by mouth daily  Problem # 5:  TOBACCO ABUSE (ICD-305.1) Patient counseled on discontinuing.  Patient Instructions: 1)  Your physician recommends that you schedule a follow-up  appointment in: 1 yr with Dr Jens Som 2)  Your physician recommends that you continue on your current medications as directed. Please refer to the Current Medication list given to you today.

## 2010-07-11 NOTE — Assessment & Plan Note (Signed)
Summary: 3 MONTH FOLLOW UP/DK   Vital Signs:  Patient profile:   60 year old female Height:      64 inches Weight:      177.75 pounds BMI:     30.62 O2 Sat:      97 % on Room air Temp:     97.6 degrees F oral Pulse rate:   77 / minute Pulse rhythm:   regular Resp:     20 per minute BP sitting:   104 / 70  (left arm) Cuff size:   large  Vitals Entered By: Glendell Docker CMA (January 03, 2010 2:12 PM)  O2 Flow:  Room air CC: Rm 3- 3 Month Follow up , Type 2 diabetes mellitus follow-up Is Patient Diabetic? Yes Did you bring your meter with you today? No Pain Assessment Patient in pain? no       Does patient need assistance? Functional Status Self care Ambulation Normal Comments c/o increase indigestion, wheezing off and on since last visit, low blood sugar 107 high 146- checked in the am before breakfeast   Primary Care Provider:  DThomos Lemons DO  CC:  Rm 3- 3 Month Follow up  and Type 2 diabetes mellitus follow-up.  History of Present Illness:  Type 2 Diabetes Mellitus Follow-Up      This is a 60 year old woman who presents for Type 2 diabetes mellitus follow-up.  The patient denies self managed hypoglycemia, hypoglycemia requiring help, and weight gain.  The patient denies the following symptoms: chest pain.  Since the last visit the patient reports good dietary compliance, compliance with medications, and not exercising regularly.   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   Lungs:  normal respiratory effort.  faint exp wheeze bilaterally Heart:  normal rate, regular rhythm, and no gallop.   Extremities:  no edema.   Impression & Recommendations:  Problem # 1:  DIABETES MELLITUS, TYPE II (ICD-250.00) Assessment Improved  The following medications were removed from the medication list:    Aspirin 81 Mg Tbec (Aspirin) .Marland Kitchen... Take one tablet by mouth daily Her updated medication list for this problem includes:    Metformin Hcl 500 Mg Xr24h-tab (Metformin hcl) ..... One by mouth three times a day    Aspirin Low Dose 81 Mg Tabs (Aspirin) .Marland Kitchen... Take 1 tablet by mouth once a day    Victoza 18 Mg/71ml Soln (Liraglutide) ..... Inject 0.6 mg once daily x  1 week, then 1.2 mg once daily    Benicar Hct 20-12.5 Mg Tabs (Olmesartan medoxomil-hctz) ..... One by mouth once daily  Labs Reviewed: Creat: 0.7 (12/27/2009)    Reviewed HgBA1c results: 6.1 (12/27/2009)  6.7 (08/30/2009)  Problem # 2:  ESSENTIAL HYPERTENSION, BENIGN (ICD-401.1)  Her updated medication list for this problem includes:    Metoprolol Tartrate 50 Mg Tabs (Metoprolol tartrate) .Marland Kitchen... Take 1 tablet by mouth twice a  day    Benicar Hct 20-12.5 Mg Tabs (Olmesartan medoxomil-hctz) ..... One by mouth once daily  Problem # 3:  BRONCHITIS (ICD-490) pt has chronic bronchitits assoc with tob use.  possible asthma.  encouraged smoking cessation.   use ICS Her updated medication list for this problem includes:    Qvar 40 Mcg/act Aers (Beclomethasone dipropionate) .Marland Kitchen... 2 puffs two times a day  Complete Medication List: 1)  Metformin Hcl 500 Mg Xr24h-tab (Metformin hcl) .... One by mouth three times a day 2)  Metoprolol Tartrate 50 Mg Tabs (Metoprolol tartrate) .... Take 1 tablet by mouth twice a day 3)  Onetouch  Test Strp (Glucose blood) .... Use 1 strip once a day 4)  Vytorin 10-40 Mg Tabs (Ezetimibe-simvastatin) .... Take 1 tablet by mouth once a day 5)  Wellbutrin Sr 150 Mg Xr12h-tab (Bupropion hcl) .... 3 tabs once daily 6)  Onetouch Ultrasoft Lancets Misc (Lancets) .... Test blood sugar once daily 7)  Aspirin Low Dose 81 Mg Tabs (Aspirin) .... Take 1 tablet by mouth once a day 8)  Omeprazole 40 Mg Cpdr (Omeprazole) .... One by mouth once daily 30 mins before am meal 9)  Abilify 5 Mg Tabs (Aripiprazole) .... Take 1 tablet by mouth once a day 10)  Victoza 18 Mg/58ml Soln (Liraglutide) .... Inject 0.6 mg once daily x  1 week, then 1.2 mg once daily 11)  Relion Pen Needles 31g X 8 Mm Misc (Insulin pen needle) .... Use once daily as directed 12)  Benicar Hct 20-12.5 Mg Tabs (Olmesartan medoxomil-hctz) .... One by mouth once daily 13)  Multivitamins Tabs (Multiple vitamin) .... Once daily 14)  Qvar 40 Mcg/act Aers (Beclomethasone dipropionate) .... 2 puffs two times a day  Patient Instructions: 1)  Please schedule a follow-up appointment in 3 months. 2)  BMP prior to visit, ICD-9:  401.9 3)  HbgA1C prior to visit, ICD-9: 250.00 4)  Gradually start daily walking program Prescriptions: QVAR 40 MCG/ACT AERS (BECLOMETHASONE DIPROPIONATE) 2 puffs two times a day  #1 x 3   Entered and Authorized by:   D. Thomos Lemons DO   Signed by:   D. Thomos Lemons DO on 01/03/2010   Method used:   Electronically to        [facility] [address] [phone].* (retail)       [address]       [place], [place]  [address]       Ph: [phone]       Fax: [phone]   RxID:   [id number] OMEPRAZOLE 40 MG CPDR (OMEPRAZOLE) one by mouth once daily 30 mins before AM meal  #30 x 5   Entered and Authorized by:   D. Thomos Lemons DO   Signed by:   D. Thomos Lemons DO on 01/03/2010   Method used:   Electronically to        [facility] [address] [phone].* (retail)       [address]       [place], [place]  [address]       Ph: [phone]       Fax: [phone]   RxID:   [id number]   Current Allergies (reviewed today): No known allergies

## 2010-07-11 NOTE — Assessment & Plan Note (Signed)
Summary: f/u - jr   Vital Signs:  Patient profile:   60 year old female Height:      64 inches Weight:      188.25 pounds BMI:     32.43 O2 Sat:      97 % on Room air Temp:     98.0 degrees F oral Pulse rate:   64 / minute Pulse rhythm:   regular Resp:     16 per minute BP sitting:   112 / 70  (right arm) Cuff size:   regular  Vitals Entered By: Glendell Docker CMA (August 30, 2009 3:41 PM)  O2 Flow:  Room air CC: Rm 2- Follow up disease management, Type 2 diabetes mellitus follow-up Is Patient Diabetic? Yes   Primary Care Provider:  Dondra Spry DO  CC:  Rm 2- Follow up disease management and Type 2 diabetes mellitus follow-up.  History of Present Illness:  Type 2 Diabetes Mellitus Follow-Up      This is a 60 year old woman who presents for Type 2 diabetes mellitus follow-up.  The patient reports weight gain.  The patient denies the following symptoms: chest pain.  Since the last visit the patient reports poor dietary compliance, compliance with medications, and not monitoring blood glucose.    pt still having right hand pain.  never use wrist splint as directed.  tob abuse - unable to quit  htn - stable.  Preventive Screening-Counseling & Management  Alcohol-Tobacco     Smoking Status: current  Allergies (verified): No Known Drug Allergies  Past History:  Past Medical History: Current Problems:  CORONARY ARTERY DISEASE (ICD-414.00) HYPERTENSION (ICD-401.9) HYPERLIPIDEMIA (ICD-272.4)   CONSTIPATION (ICD-564.00) HEALTH MAINTENANCE EXAM (ICD-V70.0) OBSTRUCTIVE SLEEP APNEA (ICD-327.23) GERD (ICD-530.81) DIABETES MELLITUS, TYPE II (ICD-250.00) DEPRESSION (ICD-311) ALLERGIC RHINITIS (ICD-477.9) TOBACCO ABUSE (ICD-305.1) HOARSENESS (ICD-784.49)  Past Surgical History:   The study was done as a Taxus followup study. The patient previously underwent percutaneous stenting using a small 2.25 mm stent in May of 2003.  Following the procedure, she has done well.   She was brought back as part of a mandated followup catheterization as part of the Taxus 5 high-risk subset study.  The patient was randomized to drug eluting stent versus no drug-eluting stent.  This was done in the small-vessel category. PRESSURES: 1. Aorta:  147/76/104. Left ventricle 152/11.Marland Kitchen No gradient on pullback across the aortic valve. ANGIOGRAPHIC DATA:Ventriculography was performed the RAO projection. Because of ectopy, the ejection fraction was not calculated.  The EF appeared in excess of 60%.No definite wall-motion abnormalities were identified.  The aortic valve appeared to open well. Well-preserved left ventricular function.. Continued patency of the previously-placed stent in the diagonal branch. Mild luminal irregularities as noted.  Arturo Morton. Riley Kill, M.D. LHCTDS/MEDQ  D:  09/11/2002  T:  09/13/2002  Job:  [id number]  TAXUS trial, and a 2.25 x 16 mm stent was utilized.  Thisstent was used to cover both the high-grade lesion as well as some segmentaldisease distal to the high-grade stenosis site as a shorter stent would end in the diseased segment.  The 16 mm stent was then placed and taken up to about11 atmospheres.  There was marked improvement in the appearance of the artery.We then took a 2.5 mm x 12 Quantum Maverick balloon and stayed inside the stent edges and did post dilatation up to about 11 atmospheres.  This yieldedan artery of about 2.46.  There was no evidence of edge tear.Dictated by:   Arturo Morton Riley Kill,  M.D. [facility] Attending Physician:  Olga Millers SandersDD:  10/27/01  Family History: noncontributory   Physical Exam  General:  alert and overweight-appearing.   Lungs:  normal respiratory effort, normal breath sounds, no crackles, and no wheezes.   Heart:  normal rate, regular rhythm, and no gallop.   Abdomen:  soft, non-tender, and normal bowel sounds.   Msk:  positive phalens Extremities:  no edema. Neurologic:  cranial nerves II-XII intact and gait normal.   Psych:   normally interactive, good eye contact, not anxious appearing, and not depressed appearing.     Impression & Recommendations:  Problem # 1:  WRIST PAIN, RIGHT (ICD-719.43) Assessment Unchanged Pt never used wrist splint as prev directed.  I suspect osteoarthritis contributing.   Use wrist splint and voltaren gel.    Problem # 2:  DIABETES MELLITUS, TYPE II (ICD-250.00) some wt gain.  difficulty controlling appetite.  abilify likely making it worse.  add victoza.   Her updated medication list for this problem includes:    Altace 10 Mg Caps (Ramipril) .Marland Kitchen... Take 1 tablet by mouth once a day    Metformin Hcl 500 Mg Xr24h-tab (Metformin hcl) ..... One by mouth three times a day    Aspirin Low Dose 81 Mg Tabs (Aspirin) .Marland Kitchen... Take 1 tablet by mouth once a day    Victoza 18 Mg/32ml Soln (Liraglutide) ..... Inject 0.6 mg once daily x  1 week, then 1.2 mg once daily  Orders: T- Hemoglobin A1C ([id number]) Nutrition Referral (Nutrition)  Problem # 3:  ESSENTIAL HYPERTENSION, BENIGN (ICD-401.1) bp at goal.  Maintain current medication regimen.  Her updated medication list for this problem includes:    Altace 10 Mg Caps (Ramipril) .Marland Kitchen... Take 1 tablet by mouth once a day    Hydrochlorothiazide 12.5 Mg Caps (Hydrochlorothiazide) .Marland Kitchen... Take 1 capsule by mouth every morning    Metoprolol Tartrate 50 Mg Tabs (Metoprolol tartrate) .Marland Kitchen... Take 1 tablet by mouth twice a day  BP today: 112/70 Prior BP: 143/79 (03/07/2009)  Labs Reviewed: K+: 4.2 (01/12/2009) Creat: : 0.8 (01/12/2009)   Chol: 134 (01/12/2009)   HDL: 37.30 (01/12/2009)   LDL: 75 (01/12/2009)   TG: 108.0 (01/12/2009)  Problem # 4:  TOBACCO ABUSE (ICD-305.1) counseled pt on smoking cessation.  use nicotine lozenges.  time spent 3-10 mins  Complete Medication List: 1)  Altace 10 Mg Caps (Ramipril) .... Take 1 tablet by mouth once a day 2)  Hydrochlorothiazide 12.5 Mg Caps (Hydrochlorothiazide) .... Take 1 capsule by mouth every  morning 3)  Metformin Hcl 500 Mg Xr24h-tab (Metformin hcl) .... One by mouth three times a day 4)  Metoprolol Tartrate 50 Mg Tabs (Metoprolol tartrate) .... Take 1 tablet by mouth twice a day 5)  Onetouch Test Strp (Glucose blood) .... Use 1 strip once a day 6)  Vytorin 10-40 Mg Tabs (Ezetimibe-simvastatin) .... Take 1 tablet by mouth once a day 7)  Wellbutrin Sr 200 Mg Tb12 (Bupropion hcl) .... Take 3 tablet by mouth once daily 8)  Onetouch Ultrasoft Lancets Misc (Lancets) .... Test blood sugar once daily 9)  Aspirin Low Dose 81 Mg Tabs (Aspirin) .... Take 1 tablet by mouth once a day 10)  Omeprazole 20 Mg Cpdr (Omeprazole) .... One by mouth once daily 11)  Abilify 5 Mg Tabs (Aripiprazole) .... Take 1 tablet by mouth once a day 12)  Victoza 18 Mg/25ml Soln (Liraglutide) .... Inject 0.6 mg once daily x  1 week, then 1.2 mg once daily 13)  Relion  Pen Needles 31g X 8 Mm Misc (Insulin pen needle) .... Use once daily as directed 14)  Voltaren 1 % Gel (Diclofenac sodium) .... Apply three times a day  Other Orders: T-Basic Metabolic Panel [id number])  Patient Instructions: 1)  Use wrist splint evey night x 1 month  2)  apply voltaren gel three times a day as directed 3)  Please schedule a follow-up appointment in 2 months.  Prescriptions: OMEPRAZOLE 20 MG CPDR (OMEPRAZOLE) one by mouth once daily  #30 x 5   Entered and Authorized by:   D. Thomos Lemons DO   Signed by:   D. Thomos Lemons DO on 08/30/2009   Method used:   Electronically to        [facility] [address] [phone].* (retail)       [address]       [place], [place]  [address]       Ph: [phone]       Fax: [phone]   RxID:   [id number] VYTORIN 10-40 MG TABS (EZETIMIBE-SIMVASTATIN) Take 1 tablet by mouth once a day  #30 x 5   Entered and Authorized by:   D. Thomos Lemons DO   Signed by:   D. Thomos Lemons DO on 08/30/2009   Method used:   Electronically to        [facility] [address] [phone].*  (retail)       [address]       [place], [place]  [address]       Ph: [phone]       Fax: [phone]   RxID:   [id number] [facility] TEST  STRP (GLUCOSE BLOOD) Use 1 strip once a day  #100 x 3   Entered and Authorized by:   D. Thomos Lemons DO   Signed by:   D. Thomos Lemons DO on 08/30/2009   Method used:   Electronically to        [facility] [address] [phone].* (retail)       [address]       [place], [place]  [address]       Ph: [phone]       Fax: [phone]   RxID:   [id number] METOPROLOL TARTRATE 50 MG TABS (METOPROLOL TARTRATE) Take 1 tablet by mouth twice a day  #60 Tablet x 5   Entered and Authorized by:   D. Thomos Lemons DO   Signed by:   D. Thomos Lemons DO on 08/30/2009   Method used:   Electronically to        [facility] [address] [phone].* (retail)       [address]       [place], [place]  [address]       Ph: [phone]       Fax: [phone]   RxID:   [id number] METFORMIN HCL 500 MG XR24H-TAB (METFORMIN HCL) one by mouth three times a day  #90 x 5   Entered and Authorized by:   D. Thomos Lemons DO   Signed by:   D. Thomos Lemons DO on 08/30/2009   Method used:   Electronically to        [facility] [address] [phone].* (retail)  [address]       [place], [place]  [address]       Ph: [phone]       Fax: [phone]   RxID:   [id number] HYDROCHLOROTHIAZIDE 12.5 MG CAPS (HYDROCHLOROTHIAZIDE) Take 1 capsule by mouth every morning  #30 Capsule x 5   Entered and Authorized by:   D. Thomos Lemons DO   Signed by:   D. Thomos Lemons DO on 08/30/2009   Method used:   Electronically to        [facility] [address] [phone].* (retail)       [address]       [place], [place]  [address]       Ph: [phone]       Fax: [phone]   RxID:   [id number] ALTACE 10 MG CAPS (RAMIPRIL) Take 1 tablet by mouth once a day  #30 x 5    Entered and Authorized by:   D. Thomos Lemons DO   Signed by:   D. Thomos Lemons DO on 08/30/2009   Method used:   Electronically to        [facility] [address] [phone].* (retail)       [address]       [place], [place]  [address]       Ph: [phone]       Fax: [phone]   RxID:   [id number] RELION PEN NEEDLES 31G X 8 MM MISC (INSULIN PEN NEEDLE) use once daily as directed  #30 x 2   Entered and Authorized by:   D. Thomos Lemons DO   Signed by:   D. Thomos Lemons DO on 08/30/2009   Method used:   Electronically to        [facility] [address] [phone].* (retail)       [address]       [place], [place]  [address]       Ph: [phone]       Fax: [phone]   RxID:   [id number] VICTOZA 31 MG/3ML SOLN (LIRAGLUTIDE) inject 0.6 mg once daily x  1 week, then 1.2 mg once daily  #1 x 2   Entered and Authorized by:   D. Thomos Lemons DO   Signed by:   D. Thomos Lemons DO on 08/30/2009   Method used:   Electronically to        [facility] [address] [phone].* (retail)       [address]       [place], [place]  [address]       Ph: [phone]       Fax: [phone]   RxID:   [id number]   Current Allergies (reviewed today): No known allergies     Immunization History:  Influenza Immunization History:    Influenza:  declined (08/30/2009)   Contraindications/Deferment of Procedures/Staging:    Test/Procedure: FLU VAX    Reason for deferment: patient declined

## 2010-07-11 NOTE — Assessment & Plan Note (Signed)
Summary: CPX/ NO PAP /$50 / NWS   Vital Signs:  Patient Profile:   60 Years Old Female Height:     64 inches Weight:      178 pounds BMI:     30.66 Temp:     97.1 degrees F oral Pulse rate:   67 / minute BP sitting:   137 / 78  (right arm)  Vitals Entered By: Glendell Docker (October 01, 2007 10:44 AM)                 Chief Complaint:  CPX.  History of Present Illness: 60 y/o white female with PMHx of htn, DM II, hypelipidemia for routine CPX.  Pt complains of constipation over last several months.  We reviewed her recent labs.  Pt reports compliance with her medications.  She denies adverse effect.  She denies chest pain or SOB.    Current Allergies (reviewed today): No known allergies   Past Medical History:    Reviewed history from 01/11/2007 and no changes required:       Allergic rhinitis       Coronary artery disease       Depression       Diabetes mellitus, type II       GERD       Hyperlipidemia       Hypertension       Obstructive Sleep Apnea  Past Surgical History:    Reviewed history from 01/11/2007 and no changes required:       Coronary artery bypass graft- 2002 & 2003   Social History:    Occupation:  Engineer, [employer]    Married    Current Smoker    Alcohol use-yes - occasional   Risk Factors:  Tobacco use:  current Alcohol use:  yes   Review of Systems      See HPI   Physical Exam  General:     alert, well-developed, and well-nourished.   Head:     normocephalic and atraumatic.   Eyes:     vision grossly intact, pupils equal, pupils round, and pupils reactive to light.   Ears:     R ear normal and L ear normal.   Nose:     External nasal examination shows no deformity or inflammation. Nasal mucosa are pink and moist without lesions or exudates. Mouth:     Oral mucosa and oropharynx without lesions or exudates.  Neck:     supple, no masses, and no carotid bruits.   Lungs:     normal respiratory effort and normal  breath sounds.   Heart:     normal rate, regular rhythm, and no gallop.   Abdomen:     soft and non-tender.   Extremities:     No lower extremity edema  Neurologic:     alert & oriented X3 and cranial nerves II-XII intact.   Skin:     turgor normal and color normal.   Psych:     normally interactive and good eye contact.      Impression & Recommendations:  Problem # 1:  HYPERTENSION (ICD-401.9) BP is stable.  Maintain current medication regimen.  Her updated medication list for this problem includes:    Altace 5 Mg Caps (Ramipril) .Marland Kitchen... Take 1 capsule by mouth once a day    Hydrochlorothiazide 12.5 Mg Caps (Hydrochlorothiazide) .Marland Kitchen... Take 1 capsule by mouth every morning    Metoprolol Tartrate 50 Mg Tabs (Metoprolol tartrate) .Marland Kitchen... Take 1 tablet  by mouth twice a day  BP today: 137/78 Prior BP: 113/78 (06/19/2006)  The following medications were removed from the medication list:    Lopressor 50 Mg Tabs (Metoprolol tartrate) .Marland Kitchen... Take 1 tablet by mouth twice a day  Her updated medication list for this problem includes:    Altace 5 Mg Caps (Ramipril) .Marland Kitchen... Take 1 capsule by mouth once a day    Hydrochlorothiazide 12.5 Mg Caps (Hydrochlorothiazide) .Marland Kitchen... Take 1 capsule by mouth every morning    Metoprolol Tartrate 50 Mg Tabs (Metoprolol tartrate) .Marland Kitchen... Take 1 tablet by mouth twice a day   Problem # 2:  HYPERLIPIDEMIA (ICD-272.4) Pt tolerating Vytorin.  LDL is at goal at 57.  Her updated medication list for this problem includes:    Vytorin 10-40 Mg Tabs (Ezetimibe-simvastatin) .Marland Kitchen... Take 1 tablet by mouth once a day   Problem # 3:  DIABETES MELLITUS, TYPE II (ICD-250.00) CBG well controlled.  Arrange A1c and microalb/Cr ratio before next visit.  Her updated medication list for this problem includes:    Altace 5 Mg Caps (Ramipril) .Marland Kitchen... Take 1 capsule by mouth once a day    Glucophage Xr 750 Mg Tb24 (Metformin hcl) ..... One by mouth bid    Aspirin Low Dose 81 Mg Tabs  (Aspirin) .Marland Kitchen... Take 1 tablet by mouth once a day   Problem # 4:  CONSTIPATION (ICD-564.00) 60 y/o with change in bowel habits.  Her TSH is normal.  Arrange colonoscopy. Orders: Gastroenterology Referral (GI)   Problem # 5:  HEALTH MAINTENANCE EXAM (ICD-V70.0) Reviewed adult health maintenance protocols.  Pt strongly urged to discontinue smoking.  Trial of nicotrol inhaler.  Orders: Gastroenterology Referral (GI)   Complete Medication List: 1)  Altace 5 Mg Caps (Ramipril) .... Take 1 capsule by mouth once a day 2)  Hydrochlorothiazide 12.5 Mg Caps (Hydrochlorothiazide) .... Take 1 capsule by mouth every morning 3)  Glucophage Xr 750 Mg Tb24 (Metformin hcl) .... One by mouth bid 4)  Metoprolol Tartrate 50 Mg Tabs (Metoprolol tartrate) .... Take 1 tablet by mouth twice a day 5)  Onetouch Test Strp (Glucose blood) .... Use 1 strip once a day 6)  Vytorin 10-40 Mg Tabs (Ezetimibe-simvastatin) .... Take 1 tablet by mouth once a day 7)  Wellbutrin Sr 200 Mg Tb12 (Bupropion hcl) .... Take 3 tablet by mouth once daily 8)  Onetouch Ultrasoft Lancets Misc (Lancets) .... Test blood sugar once daily 9)  Loratadine 10 Mg Tabs (Loratadine) .... Take 1 tablet by mouth once a day 10)  Aspirin Low Dose 81 Mg Tabs (Aspirin) .... Take 1 tablet by mouth once a day 11)  Multivitamins Tabs (Multiple vitamin) .... Take 1 tablet by mouth two times a day 12)  Flonase 50 Mcg/act Susp (Fluticasone propionate) .... 2 sprays each nostril qd 13)  Nicotrol 10 Mg Inha (Nicotine) .... Use 6-8 cartridges inh qd   Patient Instructions: 1)  Please schedule a follow-up appointment in 6 months. 2)  HbgA1C prior to visit, ICD-9:  250.00 3)  Urine Microalbumin prior to visit, ICD-9:  250.00 4)  Please return for lab work one (1) week before your next appointment.     Prescriptions: NICOTROL 10 MG  INHA (NICOTINE) Use 6-8 cartridges INH qd  #150 x 5   Entered and Authorized by:   D. Thomos Lemons DO   Signed by:   D.  Thomos Lemons DO on 10/01/2007   Method used:   Electronically sent to .Marland KitchenMarland Kitchen  [facility]  [place] #[id number].*       [address]       [place], [place]  [address]       Ph: [phone]       Fax: [phone]   RxID:   [id number] GLUCOPHAGE XR 750 MG  TB24 (METFORMIN HCL) one by mouth bid  #60 x 5   Entered and Authorized by:   D. Thomos Lemons DO   Signed by:   D. Thomos Lemons DO on 10/01/2007   Method used:   Electronically sent to ...       [facility]  [place] #[id number].*       [address]       [place], [place]  [address]       Ph: [phone]       Fax: [phone]   RxID:   [id number] Biagio Borg LANCETS   MISC (LANCETS) test blood sugar once daily  #50 x 5   Entered and Authorized by:   D. Thomos Lemons DO   Signed by:   D. Thomos Lemons DO on 10/01/2007   Method used:   Electronically sent to ...       [facility]  [place] #[id number].*       [address]       [place], [place]  [address]       Ph: [phone]       Fax: [phone]   RxID:   [id number] VYTORIN 10-40 MG TABS (EZETIMIBE-SIMVASTATIN) Take 1 tablet by mouth once a day  #30 x 5   Entered and Authorized by:   D. Thomos Lemons DO   Signed by:   D. Thomos Lemons DO on 10/01/2007   Method used:   Electronically sent to ...       [facility]  [place] #[id number].*       [address]       [place], [place]  [address]       Ph: [phone]       Fax: [phone]   RxID:   [id number] METOPROLOL TARTRATE 50 MG TABS (METOPROLOL TARTRATE) Take 1 tablet by mouth twice a day  #30 x 5   Entered and Authorized by:   D. Thomos Lemons DO   Signed by:   D. Thomos Lemons DO on 10/01/2007   Method used:   Electronically sent to ...       [facility]  [place] #[id number].*       [address]       [place], [place]  [address]       Ph: [phone]       Fax: [phone]   RxID:    [id number] HYDROCHLOROTHIAZIDE 12.5 MG CAPS (HYDROCHLOROTHIAZIDE) Take 1 capsule by mouth every morning  #30 x 5   Entered and Authorized by:   D. Thomos Lemons DO   Signed by:   D. Thomos Lemons DO on 10/01/2007   Method used:   Electronically sent to ...       [facility]  [place] #[id number].*       [address]       [place]  [place], [place]  [address]       Ph: [phone]       Fax: [phone]   RxID:   [id number] ALTACE 5 MG CAPS (RAMIPRIL) Take 1 capsule by mouth once a day  #30 x 5   Entered and Authorized by:   D. Thomos Lemons DO   Signed by:   D. Thomos Lemons DO on 10/01/2007   Method used:   Electronically sent to ...       [facility]  [place] #[id number].*       [address]       [place], [place]  [address]       Ph: [phone]       Fax: [phone]   RxID:   [id number] FLONASE 50 MCG/ACT  SUSP (FLUTICASONE PROPIONATE) 2 sprays each nostril qd  #1 x 5   Entered and Authorized by:   D. Thomos Lemons DO   Signed by:   D. Thomos Lemons DO on 10/01/2007   Method used:   Electronically sent to ...       [facility]  [place] #[id number].*       [address]       [place], [place]  [address]       Ph: [phone]       Fax: [phone]   RxID:   [id number]  ] Current Allergies (reviewed today): No known allergies    Preventive Care Screening  Last Flu Shot:    Date:  10/01/2007    Results:  Declined

## 2010-07-11 NOTE — Progress Notes (Signed)
Summary: lab result  Phone Note Outgoing Call   Summary of Call: call pt - LFTs are normal Initial call taken by: D. Thomos Lemons DO,  April 07, 2010 3:09 PM  Follow-up for Phone Call        Left detailed message on phone re: results and to call if any questions. Nicki Guadalajara Fergerson CMA Duncan Dull)  April 07, 2010 3:36 PM

## 2010-07-11 NOTE — Procedures (Signed)
Summary: Colonoscopy   Colonoscopy  Procedure date:  11/05/2007  Findings:      Location:  [facility].    Procedures Next Due Date:    Colonoscopy: 11/2008  Patient Name: [name] [name], [name] [name]. MRN:  Procedure Procedures: Colonoscopy CPT: [phone].  Personnel: Endoscopist: Vania Rea. Jarold Motto, MD.  Referred By: Thomos Lemons, D.O.  Exam Location: Exam performed in Outpatient Clinic. Outpatient  Patient Consent: Procedure, Alternatives, Risks and Benefits discussed, consent obtained, from patient. Consent was obtained by the RN.  Indications  Average Risk Screening Routine.  History  Current Medications: Patient is taking an non-steroidal medication. Patient is not currently taking Coumadin.  Medical/ Surgical History: Coronary Artery Disease, Adult Onset Diabetes, Hyperlipidemia, Hypertension,  Pre-Exam Physical: Performed Nov 05, 2007. Entire physical exam was normal.  Comments: Pt. history reviewed/updated, physical exam performed prior to initiation of sedation? yes Exam Exam: Extent of exam reached: Cecum, extent intended: Cecum.  The cecum was identified by appendiceal orifice and IC valve. Patient position: on left side. Time to Cecum: 00:03:34. Time for Withdrawl: 00:23:39. Colon retroflexion performed. Images taken. ASA Classification: II. Tolerance: excellent.  Monitoring: Pulse and BP monitoring, Oximetry used. Supplemental O2 given. at 2 Liters.  Colon Prep Used Golytely for colon prep. Prep results: excellent.  Sedation Meds: Patient assessed and found to be appropriate for moderate (conscious) sedation. Fentanyl 25 mcg. given IV. Versed 6 mg. given IV.  Instrument(s): CF 140L. Serial [id number].  Findings - NORMAL EXAM: Transverse Colon to Sigmoid Colon. Not Seen: Polyps. AVM's. Tumors. Crohn's. Diverticulosis.  - POLYP: Ascending Colon, Maximum size: 20 mm. sessile polyp. Procedure:  snare with cautery, The polyp was removed piece  meal. removed, retrieved, Polyp sent to pathology. Comments: #2.  - NORMAL EXAM: Ileum to Ascending Colon.  - POLYP: Cecum, Maximum size: 6 mm. sessile polyp. Procedure:  snare with cautery, removed, retrieved, sent to pathology. ICD9: Colon Polyps: 211.3. Comments: #1.  - MULTIPLE POLYPS: Sigmoid Colon to Rectum. minimum size 2 mm, maximum size 5 mm. Procedure:  snare with cautery, removed, retrieved, Polyps sent to pathology. Comments: #3.   Assessment  Diagnoses: 211.3: Colon Polyps.   Comments: Right colon polyp is fairly large and hard.Marland KitchenMarland KitchenR/O atypia....NEEDS F/U ONE YEAR.... Events  Unplanned Interventions: No intervention was required.  Plans  Post Exam Instructions: No aspirin or non-steroidal containing medications: 2 WEEKS.  Medication Plan: Await pathology. Referring provider to order medications.  Patient Education: Patient given standard instructions for: Polyps. Patient instructed to get routine colonoscopy every 1 years.  Disposition: After procedure patient sent to recovery. After recovery patient sent home.  Scheduling/Referral: Follow-Up prn. Await pathology to schedule patient.    cc. Thomos Lemons, D.O  Nov 07, 2007 MRN: [id number]  REPORT OF SURGICAL PATHOLOGY   Case #: [id number] Patient Name: [name] [name], [name] [name]. Office Chart Number:  [id number]   MRN: [id number] Pathologist: Ferd Hibbs. Colonel Bald, MD DOB/Age  [birth year] (Age: 60)    Gender: F Date Taken:  11/05/2007 Date Received: 11/05/2007   FINAL DIAGNOSIS   ***MICROSCOPIC EXAMINATION AND DIAGNOSIS***   1.  COLON, CECUM, POLYPS: - TUBULAR ADENOMA. - HIGH GRADE DYSPLASIA IS NOT IDENTIFIED.   2. COLON, ASCENDING, POLYP(S): - HYPERPLASTIC POLYP(S). - THERE IS NO EVIDENCE OF MALIGNANCY.   3. RECTUM, POLYP(S): - HYPERPLASTIC POLYP(S). -THERE IS NO EVIDENCE OF MALIGNANCY.   jy Date Reported:  11/06/2007     Ivin Booty B. Colonel Bald, MD *** Electronically Signed Out By JBK ***  [address] [facility] [phone] [place], [place]  [address]    Dear [name] [name],  I am pleased to inform you that the colon polyp(s) removed during your recent colonoscopy was (were) found to be benign (no cancer detected) upon pathologic examination.  I recommend you have a repeat colonoscopy examination in 1_ years to look for recurrent polyps, as having colon polyps increases your risk for having recurrent polyps or even colon cancer in the future.  Should you develop new or worsening symptoms of abdominal pain, bowel habit changes or bleeding from the rectum or bowels, please schedule an evaluation with either your primary care physician or with me.  Additional information/recommendations:  __ No further action with gastroenterology is needed at this time. Please      follow-up with your primary care physician for your other healthcare      needs.  __ Please call [phone] to schedule a return visit to review your      situation.  __ Please keep your follow-up visit as already scheduled.  _x_ Continue treatment plan as outlined the day of your exam.  Please call us if you are having persistent problems or have questions about your condition that have not been fully answered at this time.  Sincerely,  Mardella Layman MD [facility]  This letter has been electronically signed by your physician.

## 2010-07-11 NOTE — Miscellaneous (Signed)
Summary: Order for CPAP Supplies/[facility]  Order for CPAP Supplies/[facility]   Imported By: Lanelle Bal 10/07/2009 09:48:43  _____________________________________________________________________  External Attachment:    Type:   Image     Comment:   External Document

## 2010-07-11 NOTE — Miscellaneous (Signed)
Summary: Metformin  Clinical Lists Changes  Medications: Changed medication from METFORMIN HCL 500 MG TABS (METFORMIN HCL) Take 1 tablet by mouth twice a day no additional refills without office visit to METFORMIN HCL 500 MG TABS (METFORMIN HCL) Take 1 tablet by mouth twice a day no additional refills without office visit - Signed Rx of METFORMIN HCL 500 MG TABS (METFORMIN HCL) Take 1 tablet by mouth twice a day no additional refills without office visit;  #60 x 0;  Signed;  Entered by: Glendell Docker;  Authorized by: D. Thomos Lemons DO;  Method used: Electronic    Prescriptions: METFORMIN HCL 500 MG TABS (METFORMIN HCL) Take 1 tablet by mouth twice a day no additional refills without office visit  #60 x 0   Entered by:   Glendell Docker   Authorized by:   D. Thomos Lemons DO   Signed by:   Glendell Docker on 09/26/2007   Method used:   Electronically sent to ...       [facility]  [place] #[id number]       [address]       [place], [place]  [address]       Ph: [phone]       Fax: [phone]   RxID:   [id number]

## 2010-07-11 NOTE — Miscellaneous (Signed)
Summary: GI Previsit  Clinical Lists Changes  Medications: Added new medication of MOVIPREP 100 GM  SOLR (PEG-KCL-NACL-NASULF-NA ASC-C) As per prep instructions. - Signed Rx of MOVIPREP 100 GM  SOLR (PEG-KCL-NACL-NASULF-NA ASC-C) As per prep instructions.;  #1 x 0;  Signed;  Entered by: Barton Fanny RN;  Authorized by: Mardella Layman MD;  Method used: Electronic Observations: Added new observation of NKA: T (10/16/2007 14:52)    Prescriptions: MOVIPREP 100 GM  SOLR (PEG-KCL-NACL-NASULF-NA ASC-C) As per prep instructions.  #1 x 0   Entered by:   Barton Fanny RN   Authorized by:   Mardella Layman MD   Signed by:   Barton Fanny RN on 10/16/2007   Method used:   Electronically sent to ...       [facility]  [place] #[id number].*       [address]       [place], [place]  [address]       Ph: [phone]       Fax: [phone]   RxID:   [id number]

## 2010-07-11 NOTE — Miscellaneous (Signed)
Summary: Metformin  Medications Added ALTACE 5 MG CAPS (RAMIPRIL) Take 1 capsule by mouth once a day HYDROCHLOROTHIAZIDE 12.5 MG CAPS (HYDROCHLOROTHIAZIDE) Take 1 capsule by mouth every morning LOPRESSOR 50 MG TABS (METOPROLOL TARTRATE) Take 1 tablet by mouth twice a day METFORMIN HCL 500 MG TABS (METFORMIN HCL) Take 1 tablet by mouth twice a day METOPROLOL TARTRATE 50 MG TABS (METOPROLOL TARTRATE) Take 1 tablet by mouth twice a day ONETOUCH TEST  STRP (GLUCOSE BLOOD) Use 1 strip once a day VYTORIN 10-40 MG TABS (EZETIMIBE-SIMVASTATIN) Take 1 tablet by mouth once a day WELLBUTRIN SR 200 MG TB12 (BUPROPION HCL) Take 1 tablet by mouth twice a day       Clinical Lists Changes  Medications: Added new medication of ALTACE 5 MG CAPS (RAMIPRIL) Take 1 capsule by mouth once a day Added new medication of HYDROCHLOROTHIAZIDE 12.5 MG CAPS (HYDROCHLOROTHIAZIDE) Take 1 capsule by mouth every morning Added new medication of LOPRESSOR 50 MG TABS (METOPROLOL TARTRATE) Take 1 tablet by mouth twice a day Added new medication of METFORMIN HCL 500 MG TABS (METFORMIN HCL) Take 1 tablet by mouth twice a day - Signed Added new medication of METOPROLOL TARTRATE 50 MG TABS (METOPROLOL TARTRATE) Take 1 tablet by mouth twice a day Added new medication of ONETOUCH TEST  STRP (GLUCOSE BLOOD) Use 1 strip once a day Added new medication of VYTORIN 10-40 MG TABS (EZETIMIBE-SIMVASTATIN) Take 1 tablet by mouth once a day Added new medication of WELLBUTRIN SR 200 MG TB12 (BUPROPION HCL) Take 1 tablet by mouth twice a day Rx of METFORMIN HCL 500 MG TABS (METFORMIN HCL) Take 1 tablet by mouth twice a day;  #60 x 2;  Signed;  Entered by: Glendell Docker;  Authorized by: Dondra Spry DO;  Method used: Electronic Observations: Added new observation of LLIMPORTMEDS: completed (05/22/2007 6:57)    Prescriptions: METFORMIN HCL 500 MG TABS (METFORMIN HCL) Take 1 tablet by mouth twice a day  #60 x 2   Entered by:   Glendell Docker   Authorized by:   Dondra Spry DO   Signed by:   Glendell Docker on 05/22/2007   Method used:   Electronically sent to ...       [facility]  [place] #[id number].*       [address]       [place], [place]  [address]       Ph: [phone]       Fax: [phone]   RxID:   [id number]

## 2010-07-11 NOTE — Progress Notes (Signed)
Summary: rx  Phone Note Call from Patient Call back at Work Phone [phone] Call back at ext [phone]   Caller: Patient Summary of Call: Patient called to inform MD that Nicotrol rx given at 10/01/07 appt is not covered by insurance. She would like to know what else you recommend. Pt is also requesting rx for test strips.  Follow-up for Phone Call        see if nicotine patch is covered Follow-up by: D. Thomos Lemons DO,  October 08, 2007 9:50 PM  Additional Follow-up for Phone Call Additional follow up Details #1::        left mess to call office back . ........................... Lamar Sprinkles  October 09, 2007 11:59 AM     Additional Follow-up for Phone Call Additional follow up Details #2::    Pt informed will call back if needs rx Follow-up by: Lamar Sprinkles,  October 09, 2007 3:01 PM  New/Updated Medications: ONETOUCH ULTRA TEST   STRP (GLUCOSE BLOOD) for once daily testing   Prescriptions: ONETOUCH ULTRA TEST   STRP (GLUCOSE BLOOD) for once daily testing  #50 x 5   Entered and Authorized by:   D. Thomos Lemons DO   Signed by:   D. Thomos Lemons DO on 10/08/2007   Method used:   Electronically sent to ...       [facility]  [place] #[id number].*       [address]       [place], [place]  [address]       Ph: [phone]       Fax: [phone]   RxID:   [id number]

## 2010-07-11 NOTE — Progress Notes (Signed)
Summary: Schedule Colonoscopy.   Phone Note Outgoing Call Call back at [facility] Phone [phone]   Call placed by: Harlow Mares CMA Duncan Dull),  June 23, 2009 1:48 PM Call placed to: Patient Summary of Call: Left message on patients machine to call back. patient needs a direct colonoscopy as long as she meets that guidlines.  Initial call taken by: Harlow Mares CMA (AAMA),  June 23, 2009 1:50 PM  Follow-up for Phone Call        Left message on patients machine to call back.  Follow-up by: Harlow Mares CMA Duncan Dull),  July 01, 2009 10:13 AM  Additional Follow-up for Phone Call Additional follow up Details #1::        Left message on patients machine to call back. multiple attempts to contact patient with no return call. Additional Follow-up by: Harlow Mares CMA (AAMA),  July 07, 2009 10:08 AM

## 2010-07-13 NOTE — Medication Information (Signed)
Summary: Nonadherence with Statin/[facility]  Nonadherence with Statin/[facility]   Imported By: Lanelle Bal 06/09/2010 08:18:15  _____________________________________________________________________  External Attachment:    Type:   Image     Comment:   External Document

## 2010-07-13 NOTE — Medication Information (Signed)
Summary: Nonadherence with Metformin/[facility]  Nonadherence with Metformin/[facility]   Imported By: Lanelle Bal 06/09/2010 08:19:15  _____________________________________________________________________  External Attachment:    Type:   Image     Comment:   External Document

## 2010-07-19 ENCOUNTER — Other Ambulatory Visit: Payer: 59

## 2010-07-19 ENCOUNTER — Encounter (INDEPENDENT_AMBULATORY_CARE_PROVIDER_SITE_OTHER): Payer: Self-pay | Admitting: *Deleted

## 2010-07-19 ENCOUNTER — Other Ambulatory Visit: Payer: Self-pay | Admitting: Internal Medicine

## 2010-07-19 DIAGNOSIS — I10 Essential (primary) hypertension: Secondary | ICD-10-CM

## 2010-07-19 DIAGNOSIS — E785 Hyperlipidemia, unspecified: Secondary | ICD-10-CM

## 2010-07-19 DIAGNOSIS — E119 Type 2 diabetes mellitus without complications: Secondary | ICD-10-CM

## 2010-07-19 LAB — HEMOGLOBIN A1C: Hgb A1c MFr Bld: 5.9 % (ref 4.6–6.5)

## 2010-07-19 LAB — MICROALBUMIN / CREATININE URINE RATIO
Creatinine,U: 170.3 mg/dL
Microalb Creat Ratio: 0.8 mg/g (ref 0.0–30.0)
Microalb, Ur: 1.4 mg/dL (ref 0.0–1.9)

## 2010-07-19 LAB — BASIC METABOLIC PANEL
BUN: 15 mg/dL (ref 6–23)
CO2: 31 mEq/L (ref 19–32)
Calcium: 8.9 mg/dL (ref 8.4–10.5)
Chloride: 105 mEq/L (ref 96–112)
Creatinine, Ser: 0.8 mg/dL (ref 0.4–1.2)
GFR: 82.37 mL/min (ref >60.00)
Glucose, Bld: 107 mg/dL — ABNORMAL HIGH (ref 70–99)
Potassium: 4.3 mEq/L (ref 3.5–5.1)
Sodium: 142 mEq/L (ref 135–145)

## 2010-07-19 LAB — LIPID PANEL
Cholesterol: 105 mg/dL (ref 0–200)
HDL: 43.1 mg/dL (ref >39.00)
LDL Cholesterol: 48 mg/dL (ref 0–99)
Total CHOL/HDL Ratio: 2
Triglycerides: 70 mg/dL (ref 0.0–149.0)
VLDL: 14 mg/dL (ref 0.0–40.0)

## 2010-07-19 LAB — HEPATIC FUNCTION PANEL
ALT: 23 U/L (ref 0–35)
AST: 19 U/L (ref 0–37)
Albumin: 3.7 g/dL (ref 3.5–5.2)
Alkaline Phosphatase: 61 U/L (ref 39–117)
Bilirubin, Direct: 0.2 mg/dL (ref 0.0–0.3)
Total Bilirubin: 0.5 mg/dL (ref 0.3–1.2)
Total Protein: 6.3 g/dL (ref 6.0–8.3)

## 2010-07-19 NOTE — Progress Notes (Signed)
Summary: Lab Work  Phone Note Call from Patient   Caller: Patient Call For: darlene Reason for Call: Talk to Nurse Summary of Call: pt called and said that she has an appt for 02.16.12 at 3:45. Does she need to come in a few days in advance for blood work. work phone# [phone] ext. [phone]. Please assist. Initial call taken by: Elba Barman,  July 11, 2010 10:59 AM  Follow-up for Phone Call        call returned to patient at [phone] x [phone], no answer, a detailed voice message was left informing patient of fasting blood work. She was advised to have 10 hour fasting blood work done one week before her office visit with Dr Artist Pais. Message was left to call back if any questions. Follow-up by: Glendell Docker CMA,  July 11, 2010 11:33 AM

## 2010-07-27 ENCOUNTER — Encounter: Payer: Self-pay | Admitting: Internal Medicine

## 2010-07-27 ENCOUNTER — Ambulatory Visit (INDEPENDENT_AMBULATORY_CARE_PROVIDER_SITE_OTHER): Payer: 59 | Admitting: Internal Medicine

## 2010-07-27 DIAGNOSIS — I10 Essential (primary) hypertension: Secondary | ICD-10-CM

## 2010-07-27 DIAGNOSIS — E119 Type 2 diabetes mellitus without complications: Secondary | ICD-10-CM

## 2010-07-27 LAB — CONVERTED CEMR LAB

## 2010-08-14 ENCOUNTER — Telehealth: Payer: Self-pay | Admitting: Internal Medicine

## 2010-08-17 NOTE — Assessment & Plan Note (Signed)
Summary: 4 month follow up/mhf   Vital Signs:  Patient profile:   60 year old female Height:      64 inches Weight:      169.50 pounds BMI:     29.20 O2 Sat:      97 % on Room air Temp:     97.6 degrees F oral Pulse rate:   71 / minute Resp:     18 per minute BP sitting:   110 / 80  (left arm) Cuff size:   large  Vitals Entered By: Glendell Docker CMA (July 27, 2010 3:40 PM)  O2 Flow:  Room air CC: 4 Month follow up, Type 2 diabetes mellitus follow-up Is Patient Diabetic? Yes Did you bring your meter with you today? No Pain Assessment Patient in pain? no      Comments low blood  sugar 88, high 133.     Last PAP Result Due   Primary Care Provider:  DThomos Lemons DO  CC:  4 Month follow up and Type 2 diabetes mellitus follow-up.  History of Present Illness:  Type 2 Diabetes Mellitus Follow-Up      This is a 60 year old woman who presents for Type 2 diabetes mellitus follow-up.  The patient denies self managed hypoglycemia, hypoglycemia requiring help, and weight gain.  The patient denies the following symptoms: chest pain.  Since the last visit the patient reports good dietary compliance, compliance with medications, and monitoring blood glucose.    Preventive Screening-Counseling & Management  Alcohol-Tobacco     Smoking Status: current  Allergies (verified): No Known Drug Allergies  Past History:  Past Medical History: CORONARY ARTERY DISEASE (ICD-414.00) HYPERTENSION (ICD-401.9) HYPERLIPIDEMIA (ICD-272.4)    OBSTRUCTIVE SLEEP APNEA (ICD-327.23)  GERD (ICD-530.81) DIABETES MELLITUS, TYPE II (ICD-250.00) DEPRESSION (ICD-311) ALLERGIC RHINITIS (ICD-477.9)   Family History: noncontributory      Physical Exam  General:  alert, well-developed, and well-nourished.   Neck:  supple with no bruits. No thyromegaly noted. Lungs:  normal respiratory effort and normal breath sounds.   Heart:  normal rate, regular rhythm, and no gallop.   Neurologic:   cranial nerves II-XII intact and gait normal.     Impression & Recommendations:  Problem # 1:  DIABETES MELLITUS, TYPE II (ICD-250.00) Assessment Improved pt doing very well.  Maintain current medication regimen. we discussed possible side effects of current meds. pt advised to call office if she experiences new symptoms  Her updated medication list for this problem includes:    Metformin Hcl 500 Mg Xr24h-tab (Metformin hcl) ..... One by mouth three times a day    Aspirin Low Dose 81 Mg Tabs (Aspirin) .Marland Kitchen... Take 1 tablet by mouth once a day    Victoza 18 Mg/7ml Soln (Liraglutide) ..... Inject 1.2 mg [place]  once daily    Benicar Hct 20-12.5 Mg Tabs (Olmesartan medoxomil-hctz) ..... One half tab by mouth once daily  Labs Reviewed: Creat: 0.8 (07/19/2010)    Reviewed HgBA1c results: 5.9 (07/19/2010)  6.2 (03/29/2010)  Problem # 2:  ESSENTIAL HYPERTENSION, BENIGN (ICD-401.1) Assessment: Unchanged  Her updated medication list for this problem includes:    Metoprolol Tartrate 50 Mg Tabs (Metoprolol tartrate) .Marland Kitchen... Take 1 tablet by mouth twice a day    Benicar Hct 20-12.5 Mg Tabs (Olmesartan medoxomil-hctz) ..... One half tab by mouth once daily  BP today: 110/80 Prior BP: 100/70 (04/06/2010)  Labs Reviewed: K+: 4.3 (07/19/2010) Creat: : 0.8 (07/19/2010)   Chol: 105 (07/19/2010)   HDL: 43.10 (  07/19/2010)   LDL: 48 (07/19/2010)   TG: 70.0 (07/19/2010)  Complete Medication List: 1)  Metformin Hcl 500 Mg Xr24h-tab (Metformin hcl) .... One by mouth three times a day 2)  Metoprolol Tartrate 50 Mg Tabs (Metoprolol tartrate) .... Take 1 tablet by mouth twice a day 3)  Onetouch Test Strp (Glucose blood) .... Use 1 strip once a day 4)  Crestor 20 Mg Tabs (Rosuvastatin calcium) .... One by mouth once daily 5)  Wellbutrin Sr 150 Mg Xr12h-tab (Bupropion hcl) .... 3 tabs once daily 6)  Onetouch Ultrasoft Lancets Misc (Lancets) .... Test blood sugar once daily 7)  Aspirin Low Dose 81 Mg Tabs  (Aspirin) .... Take 1 tablet by mouth once a day 8)  Omeprazole 40 Mg Cpdr (Omeprazole) .... One by mouth once daily 30 mins before am meal 9)  Abilify 5 Mg Tabs (Aripiprazole) .... Take 1 tablet by mouth once a day 10)  Victoza 18 Mg/48ml Soln (Liraglutide) .... Inject 1.2 mg [place]  once daily 11)  Relion Pen Needles 31g X 8 Mm Misc (Insulin pen needle) .... Use once daily as directed 12)  Benicar Hct 20-12.5 Mg Tabs (Olmesartan medoxomil-hctz) .... One half tab by mouth once daily 13)  Multivitamins Tabs (Multiple vitamin) .... Take 1 tablet by mouth once a day  Patient Instructions: 1)  Please schedule a follow-up appointment in 4 months. 2)  BMP prior to visit, ICD-9: 401.9 3)  HbgA1C prior to visit, ICD-9: 250.00 4)  Urine Microalbumin prior to visit, ICD-9: 250.00 5)  Please return for lab work one (1) week before your next appointment.  Prescriptions: RELION PEN NEEDLES 31G X 8 MM MISC (INSULIN PEN NEEDLE) use once daily as directed  #100 x 5   Entered and Authorized by:   D. Thomos Lemons DO   Signed by:   D. Thomos Lemons DO on 07/27/2010   Method used:   Print then Give to Patient   RxID:   [id number] BENICAR HCT 20-12.5 MG TABS (OLMESARTAN MEDOXOMIL-HCTZ) one half tab by mouth once daily  #30 Tablet x 5   Entered and Authorized by:   D. Thomos Lemons DO   Signed by:   D. Thomos Lemons DO on 07/27/2010   Method used:   Print then Give to Patient   RxID:   [id number] VICTOZA 18 MG/3ML SOLN (LIRAGLUTIDE) inject 1.2 mg [place]  once daily  #1 month x 5   Entered and Authorized by:   D. Thomos Lemons DO   Signed by:   D. Thomos Lemons DO on 07/27/2010   Method used:   Print then Give to Patient   RxID:   [id number] CRESTOR 20 MG TABS (ROSUVASTATIN CALCIUM) one by mouth once daily  #30 x 5   Entered and Authorized by:   D. Thomos Lemons DO   Signed by:   D. Thomos Lemons DO on 07/27/2010   Method used:   Print then Give to Patient   RxID:   [id number] METFORMIN HCL 500 MG XR24H-TAB  (METFORMIN HCL) one by mouth three times a day  #90 x 5   Entered and Authorized by:   D. Thomos Lemons DO   Signed by:   D. Thomos Lemons DO on 07/27/2010   Method used:   Print then Give to Patient   RxID:   [id number]    Orders Added: 1)  Est. Patient Level III [60109]    Current Allergies (reviewed today): No known allergies     Preventive  Care Screening  Pap Smear:    Date:  07/27/2010    Results:  Due

## 2010-08-22 NOTE — Progress Notes (Signed)
Summary: refill-metoprolol  Phone Note Refill Request Message from:  Fax from Pharmacy on August 14, 2010 10:23 AM  Refills Requested: Medication #1:  METOPROLOL TARTRATE 50 MG TABS Take 1 tablet by mouth twice a day   Dosage confirmed as above?Dosage Confirmed   Brand Name Necessary? No   Supply Requested: 1 month   Last Refilled: 07/08/2010 [facility] [address] [place] [address] fax [phone]   Method Requested: Electronic Next Appointment Scheduled: none Initial call taken by: Elba Barman,  August 14, 2010 10:27 AM  Follow-up for Phone Call        Rx completed in Dr. Tiajuana Amass Follow-up by: Glendell Docker CMA,  August 14, 2010 12:25 PM    Prescriptions: METOPROLOL TARTRATE 50 MG TABS (METOPROLOL TARTRATE) Take 1 tablet by mouth twice a day  #60 x 6   Entered by:   Glendell Docker CMA   Authorized by:   D. Thomos Lemons DO   Signed by:   Glendell Docker CMA on 08/14/2010   Method used:   Electronically to        [facility] [phone].* (retail)       [address]       [place], [place]  [address]       Ph: [phone]       Fax: [phone]   RxID:   [id number]

## 2010-10-24 NOTE — Assessment & Plan Note (Signed)
[facility]                            CARDIOLOGY OFFICE NOTE   NAME:[name] [name]                         MRN:          [id number]  DATE:10/09/2007                            DOB:          1950/11/18    [name] [name] is a very pleasant female who has a history of coronary  disease.  She has had a prior PCI of her diagonal.  Her most recent  catheterization in April of 2004 showed no obstructive disease, and her  ejection fraction was normal.  Since I last saw her, she does have some  dyspnea on exertion, but there is no orthopnea, PND, pedal edema,  palpitations, presyncope, syncope, or chest pain.  She does continue to  smoke.  She is not following a diet nor is she exercising.   MEDICATIONS:  1. Lopressor 50 mg p.o. b.i.d.  2. Altace 5 mg daily.  3. Prilosec over-the-counter daily.  4. Aspirin 81 mg daily.  5. HCTZ 12.5 mg daily.  6. Vytorin 10/40 daily.  7. Wellbutrin 200 mg p.o. b.i.d.  8. Glucophage 750 mg p.o. b.i.d.  9. Loratadine.  10.A multivitamin.   PHYSICAL EXAMINATION:  VITAL SIGNS:  Blood pressure 126/78, pulse is 70.  She weighs 177 pounds.  HEENT:  Normal.  NECK:  Supple.  No bruits.  CHEST:  Clear.  CARDIOVASCULAR:  Regular rate and rhythm.  ABDOMEN:  No tenderness.  There is no bruit and no pulsatile mass noted.  EXTREMITIES:  No edema.   Her electrocardiogram shows a sinus rhythm at a rate of 65.  There are  no significant ST changes.   LABORATORY DATA:  She did have laboratories drawn recently, and her  liver functions were normal, other than a mildly elevated SGPT of 36.  Her total cholesterol was 119, with an HDL of 42 and LDL of 57.  Dr. Artist Pais  is follow\ing these values.   DIAGNOSES:  1. Coronary artery disease.  [name] [name] is doing well from a symptomatic      standpoint.  We will continue with her aspirin, beta blocker,      statin, and ACE inhibitor.  We will schedule her to have a stress      Myoview for risk  stratification.  We discussed risk factor      modification today including diet and exercise.  We also discussed      the importance of discontinuing tobacco use.  2. Hypertension.  Blood pressure is adequately controlled on present      medications.  3. Hyperlipidemia.  She will continue on her statin, and this is being      followed by Dr. Artist Pais.  4. Tobacco abuse.  Again, we discussed the importance of discontinuing      this.  5. Diabetes mellitus.  6. Sleep apnea.  7. Gastric reflux disease.   I will see her back in 1 year if her Myoview as low risk.     Madolyn Frieze Jens Som, MD, [facility]  Electronically Signed    BSC/MedQ  DD: 10/09/2007  DT: 10/09/2007  Job #:  [id number] 

## 2010-10-27 NOTE — Assessment & Plan Note (Signed)
[facility]                            CARDIOLOGY OFFICE NOTE   NAME:[name], [name] [name]                         MRN:          [id number]  DATE:05/31/2006                            DOB:          31-Mar-1951    The patient returns for followup today.  Since I last saw her, she is  doing well from a symptomatic standpoint.  There is mild dyspnea on  exertion, but there is no orthopnea, PND, pedal edema, palpitations,  presyncope, syncope or chest pain.  She does continue to smoke, and does  not exercise routinely or follow on a diet by her report.   MEDICATIONS:  1. Lopressor 50 mg p.o. b.i.d.  2. Altace 5 mg p.o. daily.  3. Prilosec.  4. Aspirin 81 mg daily.  5. Hydrochlorothiazide 12.5 mg daily.  6. Vytorin 10/40 nightly.  7. Wellbutrin.  8. Metformin 500 mg p.o. b.i.d.   PHYSICAL EXAMINATION:  Shows a blood pressure of 120/70 and the pulse is  60.  NECK:  Supple with no bruits.  CHEST:  Shows mild rhonchi diffusely.  CARDIOVASCULAR:  Exam reveals a regular rate and rhythm.  There is a 1-  2/6 systolic ejection murmur at the left sternal border.  S2 is  preserved.  ABDOMEN:  Exam shows no pulsatile masses and no bruits.  EXTREMITIES:  Show no edema.   An electrocardiogram today shows a sinus rhythm at a rate of 60.  There  are no significant ST changes noted.   DIAGNOSES:  1. Coronary artery disease.  2. Hypertension.  3. Hyperlipidemia.  4. Continuing tobacco abuse.  5. Diabetes mellitus.  6. Sleep apnea.  7. Gastroesophageal reflux disease.   PLAN:  [name] is doing well from a symptomatic standpoint, and we will  continue with her present medications.  We again discussed risk factor  modification.  I have explained to her that she needs to discontinue her  tobacco use, and we discussed diet and exercise.  I will have a  CMET and lipids drawn today, and we will adjust her medications as  indicated.  We will see her back in 1  year.     Madolyn Frieze Jens Som, MD, [facility]  Electronically Signed    BSC/MedQ  DD: 05/31/2006  DT: 06/01/2006  Job #: [id number]

## 2010-10-27 NOTE — Cardiovascular Report (Signed)
[place]. [facility]  Patient:    [name] [name] Visit Number: [id number] MRN: [id number]          Service Type: MED Location: 6500 [id number] Attending Physician:  Junious Silk Dictated by:   Arturo Morton Riley Kill, M.D. [facility] Proc. Date: 10/27/01 Admit Date:  10/24/2001 Discharge Date: 10/28/2001   CC:         [name] [name]. [name] [name], M.D. [facility]  CV Laboratory  Jerl Mina, M.D.   Cardiac Catheterization  INDICATIONS:  [name] [name] is a 60 year old who presents with a non-ST elevation myocardial infarction.  Troponins were positive x 3 although the CKs were negative.  There were no definite electrocardiographic abnormalities.  The current study was done to assess coronary anatomy.  PROCEDURE: 1. Left heart catheterization. 2. Selective coronary arteriography. 3. Selective left ventriculography. 4. Percutaneous coronary intervention of the first diagonal.  CARDIOLOGIST:  Arturo Morton. Riley Kill, M.D. [facility]  DESCRIPTION OF THE PROCEDURE:  The patient was brought to the catheterization lab and prepped and draped in the usual fashion.  Through an anterior puncture, the right femoral artery was easily entered.  Pressures were measured in the central aorta and left ventricle.  Ventriculography was performed in the RAO projection.  Following this, coronary arteriography was performed in both the left and right coronary arteries.  The patient was noted to have a high-grade stenosis of the LAD diagonal.  The diagonal did not appear to be in excess of 2.3 mm, and, therefore, we felt that it was appropriate to consider enrolling the patient in the TAXUS V high-risk trial which includes small vessels, drug-eluding stents currently are not available in less than 2.5 sizing.  With this, I discussed the case with the patient and subsequently with the family.  They were both in agreement to go ahead.  They understood the risks.  A JL4 guiding catheter was utilized to intubate  the left main.  Heparin and Integrilin were given according to protocol and ACT checked and found to be just slightly in excess of 300 seconds.  A 0.014 high-torque floppy wire was then passed down the diagonal branch and predilatation done with a 2.25 x 8 mm balloon.  Following this, randomization occurred, and the patient was randomized in the TAXUS trial, and a 2.25 x 16 mm stent was utilized.  This stent was used to cover both the high-grade lesion as well as some segmental disease distal to the high-grade stenosis site as a shorter stent would end in the diseased segment.  The 16 mm stent was then placed and taken up to about 11 atmospheres.  There was marked improvement in the appearance of the artery. We then took a 2.5 mm x 12 Quantum Maverick balloon and stayed inside the stent edges and did post dilatation up to about 11 atmospheres.  This yielded an artery of about 2.46.  There was no evidence of edge tear.  All catheters were subsequently removed and the patient taken to the holding area in satisfactory clinical condition.  Generous doses of intracoronary nitroglycerin were given throughout the procedure.  HEMODYNAMIC DATA: 1. The central aortic pressure 148/77. 2. Left ventricle pressure 140/9. 3. No aortic-to-left-ventricular gradient.  ANGIOGRAPHIC DATA: 1. Left ventriculography was performed in the RAO projection.  Overall    systolic function was preserved.  No segmental abnormalities or    contractions were identified. 2. The left main coronary artery was free of critical disease. 3. The left anterior descending artery coursed to the  apex.  There was    about a 20% lesion proximally and 30% lesion distally.  There was mild    luminal irregularity throughout the LAD.  The first diagonal was a    moderately large vessel that had some ostial proximal disease of about    30 to 40%, and this was followed by a 90% focal stenosis, then followed    by a 50% area of  segmental disease.  The distal vessel was a moderate    size vessel being about 2.25 mm.  Following the stent procedure, the    90 and 50% area of disease was reduced to 0% with an excellent angiographic    appearance and nearly a negative stenosis result of approximately -10%. 4. The ramus intermedius was without critical disease. 4. The circumflex was a large caliber vessel that demonstrated very mild    luminal irregularities but no significant focal stenoses. 5. The right coronary artery was a dominant vessel providing posterior    descending and posterolateral system.  There was about 20% narrowing    proximally followed by 20% distal stenosis.  Luminal irregularities were    noted throughout.  CONCLUSIONS: 1. Preserved left ventricular function. 2. High-grade stenosis of the first diagonal branch treated with stenting    with reduction in the stenosis from 90 to 0% in the TAXUS V protocol.  DISPOSITION:  The patient will be treated with aspirin and Plavix.  She will follow up with Dr. Olga [name]. Dictated by:   Arturo Morton Riley Kill, M.D. [facility] Attending Physician:  Junious Silk DD:  10/27/01 TD:  10/29/01 Job: [id number]

## 2010-10-27 NOTE — H&P (Signed)
[place]. [facility]  Patient:    [name] [name], [name] [name] Visit Number: [id number] MRN: [id number]          Service Type: MED Location: 6500 [id number] Attending Physician:  Junious Silk Dictated by:   Madolyn Frieze. Jens Som, M.D. [facility] Admit Date:  10/24/2001 Discharge Date: 10/28/2001   CC:         Robert Bellow, M.D., Urgent Care   History and Physical  CHIEF COMPLAINT: [name] [name] is a 60 year old female, with no prior cardiac history, who presents for evaluation of chest pain.  HISTORY OF PRESENT ILLNESS: She does have a history of hypertension, hyperlipidemia, tobacco use, and gastroesophageal reflux disease.  Yesterday while at work she developed substernal chest pain that was described as a "pressure."  It radiated to the neck.  There was no associated nausea, vomiting, shortness of breath, or diaphoresis.  The pain was not related to food nor was it pleuritic or positional.  It had a duration of approximately 30 minutes and resolved spontaneously.  She had recurrent chest pain this morning for approximately two hours, and was seen by Dr. Perrin Maltese.  She was referred to the emergency room for further evaluation.  She is presently pain-free.  Of note, she denies any exertional chest pain and there is no dyspnea on exertion, orthopnea, PND, pedal edema, or syncope.  Also of note, she states this pain is different from her normal reflux pain.  She has been on no recent trips and there has been no hemoptysis.  PAST MEDICAL HISTORY:  1. Hypertension.  2. Mild hyperlipidemia, but there is no diabetes mellitus.  3. She has a history of gastroesophageal reflux disease.  4. Sleep apnea.  5. She has a history of irritable bowel.  6. She has had prior tubal ligation.  7. Benign growth removed from her neck.  SOCIAL HISTORY: She does smoke and occasionally consumes alcohol.  She is married with three children.  FAMILY HISTORY: Her mother had a myocardial infarction  in her 59s but there is no other significant coronary disease.  Her father died of prostate cancer by report.  PRESENT MEDICATIONS:  1. Hydrochlorothiazide 50 mg p.o. q.d.  2. Zantac.  ALLERGIES: No known drug allergies.  REVIEW OF SYSTEMS: She occasionally has headaches but there is no fever, chills, or productive cough.  There is no hemoptysis.  There is no dysphagia, odynophagia, melena, or hematochezia.  There is no dysuria or hematuria. There is no rash or seizure activity.  There is no orthopnea, PND, or pedal edema.  There is no claudication noted.  The remaining systems are negative.  PHYSICAL EXAMINATION:  VITAL SIGNS: She is afebrile and her blood pressure is 138/88.  Her pulse is 81.  Height 5 feet 4 inches.  Weight 158 pounds.  GENERAL: She is well-developed, well-nourished, in no acute distress.  SKIN: Warm and dry.  HEENT: Unremarkable with normal eyelids.  NECK: Supple with normal upstroke bilaterally.  There are no bruits noted. There is no jugular venous distention and no thyromegaly.  CHEST: Clear to auscultation with normal expansion.  CARDIOVASCULAR: Regular rate and rhythm with normal S1 and S2.  There is a 1/6 systolic ejection murmur at the left sternal border.  There is no S3 or S4.  ABDOMEN: Nontender, nondistended.  Positive bowel sounds.  No hepatosplenomegaly.  No masses appreciated.  There is no right upper quadrant tenderness.  She has 2+ femoral pulses bilaterally and there are no bruits noted.  EXTREMITIES:  No edema.  I can palpate no cords.  She has 2+ posterior tibial pulses bilaterally.  NEUROLOGIC: Grossly intact.  She does have a negative Homans.  LABORATORY DATA: Electrocardiogram today shows a normal sinus rhythm at a rate of 80.  The axis is normal.  There are no significant ST changes.  Her chest x-ray shows no active disease.  The remaining laboratories are pending.  DIAGNOSES:  1. Chest pain, concerning for angina.  2.  Hypertension.  3. History of hyperlipidemia.  4. Tobacco use.  5. History of gastroesophageal reflux disease.  PLAN: [name] [name] presents for evaluation of chest pain.  Her symptoms are worrisome in their description and she has multiple risk factors.  I did recommend cardiac catheterization today but she is hesitant to proceed.  The risks and benefits were discussed with [name] [name].  She would prefer to avoid if possible.  We will therefore admit and rule out myocardial infarction with serial enzymes and we will repeat her electrocardiogram in the morning.  If there is any abnormality in her enzymes or electrocardiogram, then she would agree to cardiac catheterization at that point.  If they are normal then we will risk stratify with a stress Cardiolite.  If it is abnormal then we will proceed with catheterization following the Cardiolite.  We will treat with aspirin, Lovenox, and low-dose Lopressor.  We will also check a D-dimer, although I think the likelihood of pulmonary embolus is small.  We did discuss discontinuing her tobacco use.  We will make further recommendations once we have the above information. Dictated by:   Madolyn Frieze. Jens Som, M.D. [facility] Attending Physician:  Junious Silk DD:  10/24/01 TD:  10/25/01 Job: [id number]

## 2010-10-27 NOTE — Procedures (Signed)
[name] [name], [name] [name]                  ACCOUNT NO.:  [id number]   MEDICAL RECORD NO.:  [id number]          PATIENT TYPE:  OUT   LOCATION:  SLEEP CENTER                 FACILITY:  [facility]   PHYSICIAN:  Clinton D. Maple Hudson, M.D. DATE OF BIRTH:  November 30, 1950   DATE OF STUDY:  11/07/2004                              NOCTURNAL POLYSOMNOGRAM   REFERRING PHYSICIAN:  Dr. Jetty Duhamel   DATE OF STUDY:  Nov 07, 2004   INDICATION FOR STUDY:  Hypersomnia with sleep apnea.  Epworth Sleepiness  Score 12/24, BMI 30, weight 180 pounds.   SLEEP ARCHITECTURE:  Total sleep time 341 minutes with sleep efficiency 79%.  Stage I was 8%, stage II 69%, stages III and IV 8%, REM 15% of total sleep  time.  Sleep latency 62 minutes, REM latency 223 minutes, awake after sleep  onset 34 minutes, arousal index 22.   RESPIRATORY DATA:  Split-study protocol.  Respiratory disturbance index  (RDI) 45 obstructive events per hour indicating severe obstructive sleep  apnea/hypopnea syndrome before CPAP.  There were 79 obstructive apneas and  26 hypopneas before CPAP.  Events were not positional.  REM RDI 0.  CPAP was  titrated to 10 CWP, RDI 0 per hour, using a petite ComfortGel Mask with  heated humidifier.   OXYGEN DATA:  Moderate snoring with oxygen desaturation to a nadir of 85%.  After CPAP control saturation held 95-98% on room air.   CARDIAC DATA:  Normal sinus rhythm.   MOVEMENT/PARASOMNIA:  Occasional leg jerks with little effect on sleep.   IMPRESSION/RECOMMENDATION:  1.  Severe obstructive sleep apnea/hypopnea syndrome, respiratory      disturbance index 45 per hour with moderate snoring and oxygen      desaturation to 85%.  2.  Successful continuous positive airway pressure titration to 10 CWP,      respiratory disturbance index 0 per hour, using a petite ComfortGel Mask      with heated humidifier.    CDY/MEDQ  D:  11/12/2004 11:24:00  T:  11/12/2004 14:59:59  Job:  [id number]

## 2010-10-27 NOTE — Cardiovascular Report (Signed)
[name] [name], [name] [name]                            ACCOUNT NO.:  [id number]   MEDICAL RECORD NO.:  [id number]                   PATIENT TYPE:  OIB   LOCATION:  2853                                 FACILITY:  [facility]   PHYSICIAN:  Arturo Morton. Riley Kill, M.D. [facility]         DATE OF BIRTH:  10-07-1950   DATE OF PROCEDURE:  DATE OF DISCHARGE:                              CARDIAC CATHETERIZATION   DESCRIPTION OF THE PROCEDURE:  The study was done as a Taxus followup study.  The patient previously underwent percutaneous stenting using a small 2.25 mm  stent in May of 2003.  Following the procedure, she has done well.  She was  brought back as part of a mandated followup catheterization as part of the  Taxus 5 high-risk subset study.  The patient was randomized to drug eluting  stent versus no drug-eluting stent.  This was done in the small-vessel  category.   PROCEDURE:  1. Left-heart catheterization.  2. Selective coronary arteriography.  3. Selective left ventriculography.   DESCRIPTION OF PROCEDURE:  The patient was brought to the catheterization  laboratory after informed consent, and prepped and draped in the usual  fashion.  Through an anterior puncture, the right femoral artery was easily  entered.  A 6-French sheath was used.  Standard Judkins catheters were used  after the administration of nitroglycerin.  Ventriculography was performed  in the RAO projection.  The patient was taken to the holding area for direct  manual hemostasis.   PRESSURES:  1. Aorta:  147/76/104.  2. Left ventricle 152/11.  3. No gradient on pullback across the aortic valve.   ANGIOGRAPHIC DATA:  1. Ventriculography was performed the RAO projection. Because of ectopy, the     ejection fraction was not calculated.  The EF appeared in excess of 60%.     No definite wall-motion abnormalities were identified.  The aortic valve     appeared to open well.  2. The left main coronary was free of critical disease.  3.  The left anterior descending artery demonstrates several areas of mild     luminal irregularity.  The most prominent areas are just beyond the     origin of the diagonal and 30% proximal, 20% distal.  There is a 30% area     of narrowing leading into the previously-placed small-vessel stent in the     diagonal.  The stent appears to be widely patent with perhaps 20%     narrowing in the mid portion of the stent.  This does not appear to be     high-grade or flow limiting.  The runoff  in the distal diagonal is     excellent.  The distal LAD is without critical disease.  4. There is a small ramus intermedius with mild luminal irregularity, but no     high-grade areas of focal stenosis.  5. There is a circumflex that provides  three marginal branches.  It is     fairly large in caliber.  Other than minor luminal irregularity, no     critical stenoses are noted.  6. The right coronary artery is a dominant vessel with a posterior     descending  and a smaller posterolateral system.  It is also of     moderately large caliber.  In the proximal vessel, there is about 20%     luminal irregularity, in the distal vessel 20% luminal irregularity     without high-grade areas of focal stenosis.   CONCLUSIONS:  1. Well-preserved left ventricular function.  2. Continued patency of the previously-placed stent in the diagonal branch.  3. Mild luminal irregularities as noted.   PLAN AND RECOMMENDATIONS:  1. I had a long discussion with the patient today regarding smoking.  Her risk of acute coronary syndrome will continue to be high as long as she  continues to smoke.  1. Continue to follow up with Dr. Olga Millers will be recommended.                                               Arturo Morton. Riley Kill, M.D. [facility]    TDS/MEDQ  D:  09/11/2002  T:  09/13/2002  Job:  [id number]   cc:   Arturo Morton. Riley Kill, M.D. [facility]   Olga Millers, M.D. [facility]   [facility]

## 2010-10-27 NOTE — Discharge Summary (Signed)
[place]. [facility]  Patient:    [name] [name], [name] [name] Visit Number: [id number] MRN: [id number]          Service Type: MED Location: [phone] Attending Physician:  Junious Silk Dictated by:   Pennelope Bracken, N.P. Admit Date:  10/24/2001 Disc. Date: 10/28/01   CC:         [facility]   Discharge Summary  DATE OF BIRTH:  05-25-1951  CONSULTING PHYSICIAN:  Arturo Morton. Riley Kill, M.D. [facility].  REASON FOR ADMISSION:  Chest pain.  DISCHARGE DIAGNOSES: 1. Coronary artery disease, status post non-ST elevation myocardial infarction    this admission.  Cardiac catheterization revealing an occlusion of the    first diagonal with stent placement reducing stenosis there from 90% to    0%. 2. Randomization in TAXUS trial. 3. Normal ejection fraction. 4. Hyperlipidemia. 5. Hypertension. 6. Ongoing tobacco abuse. 7. Reflux disease. 8. Obstructive sleep apnea.  HISTORY OF THE PRESENT ILLNESS:  This delightful 60 year old lady was in her usual state of health on day of admission when she experienced an episode of chest pressure with burning radiation to bilateral sides of the neck.  Her episode resolved on its own in 30 minutes without any treatment.  The patient subsequently went to sleep using her CPAP machine and felt fine during the night, but when she awoke and began dressing the pain returned.  She saw an occupational health nurse at work who advised her to come to the emergency department for further evaluation.  She was pain-free on arrival but, given her many risk factors, we elected to admit her.  HOSPITAL COURSE:  The patient was placed on telemetry and continued on her home medications.  Serial cardiac enzymes were drawn and the troponins were positive at 0.13, 0.53 and 0.29, respectively.  At this juncture a scheduled Cardiolite evaluation was canceled and the patient was taken to the cardiac catheterization lab.  The patient was  randomized in the TAXUS V trial.  A 2.25 stent was placed in the first diagonal, reducing stenosis there from 90% to 0. Angiography revealed diffuse disease elsewhere, notably, 20% proximal and distal RCA, 20% disease to the left main, 30% to the mid LAD and 30% obstruction found in the circumflex.  Left ventricular function was normal. The patient tolerated the procedure well and returned to the floor in good condition.  She was continued on an Integrilin drip until just before discharge.  While in the hospital the patient received counseling for smoking cessation as well as nutrition advice per cardiac rehab team.  She was seen by Dr. Jens Som who reviewed with her the long-term plan for her care.  PHYSICAL EXAMINATION ON DAY OF DISCHARGE:  GENERAL:  The patient offered no complaints of chest pain or shortness of breath.  VITAL SIGNS:  Blood pressure 110/50, pulse 60, respirations 20, pulse oximetry 96% on room air, afebrile.  CHEST:  Clear to auscultation bilaterally.  HEART:  Regular rate and rhythm without murmur, rub or gallop.  Right groin site without evidence of hematoma or bruit.  EXTREMITIES:  Without cyanosis, clubbing or edema.  LABORATORIES:  Cardiac enzymes: Set #1 CK 107, MB 3.4, troponin I 0.13; set # CK 115, MB 3.8, troponin I 0.53; set #3 CK 91, MB 2.4, troponin I 0.29.  A lipid profile was obtained revealing total cholesterol 199, triglycerides 130, HDL 47, LDL 126.  TSH was within normal limits.  Chemistries at discharge as follows: Sodium 141,  potassium 3.6, chloride 106, CO2 26, BUN 6, creatinine 0.7, glucose 104.  Hemogram: WBC 5.0, hemoglobin 13.1, hematocrit 37.0, platelets 153.  Chest x-ray showed no acute disease and 12-lead EKG showed sinus bradycardia at a rate of 56, no ischemic ST morphology.  DISPOSITION:  The patient is discharged to home on the following medications.  DISCHARGE MEDICATIONS: 1. Plavix 75 mg one q.d.  A prescription is  written for one month; the    remaining five months of treatment will be provided by research. 2. Zocor 40 mg one q.h.s. 3. Altace 2.5 mg one q.d. 4. Lopressor 50 mg one b.i.d. 5. Aspirin 325 one q.d. 6. Nitroglycerin 0.4 one sublingual q.84M. x3 p.r.n. chest pain. 7. Wellbutrin 150 one q.d. x1 week and then one b.i.d.  ACTIVITY RESTRICTIONS:  No heavy lifting, tub baths, driving, exertion, sexual activity x2 days.  DIET RECOMMENDED:  Low-fat, low-cholesterol diet.  SPECIAL INSTRUCTIONS:  The patient agrees to call if groin becomes hot or pain.  She is given explicit instructions on how to achieve smoking cessation and 20 minutes is devoted to counseling about this.  FOLLOWUP:  She will follow up with Dr. Jens Som on June 26, at 2:15, but agrees to call in the interim if there are any problems, questions or concerns or a change or increase in symptoms. Dictated by:   Pennelope Bracken, N.P. Attending Physician:  Junious Silk DD:  10/28/01 TD:  10/28/01 Job: [id number]

## 2010-11-09 ENCOUNTER — Telehealth: Payer: Self-pay | Admitting: Cardiology

## 2010-11-09 NOTE — Telephone Encounter (Signed)
All Cardiac faxed to [facility] @ [phone]  11/09/10/km

## 2010-11-13 ENCOUNTER — Other Ambulatory Visit: Payer: 59

## 2010-11-13 ENCOUNTER — Other Ambulatory Visit: Payer: Self-pay | Admitting: Internal Medicine

## 2010-11-13 DIAGNOSIS — E119 Type 2 diabetes mellitus without complications: Secondary | ICD-10-CM

## 2010-11-13 DIAGNOSIS — I10 Essential (primary) hypertension: Secondary | ICD-10-CM

## 2010-12-27 ENCOUNTER — Other Ambulatory Visit (INDEPENDENT_AMBULATORY_CARE_PROVIDER_SITE_OTHER): Payer: 59

## 2010-12-27 DIAGNOSIS — E119 Type 2 diabetes mellitus without complications: Secondary | ICD-10-CM

## 2010-12-27 DIAGNOSIS — I10 Essential (primary) hypertension: Secondary | ICD-10-CM

## 2010-12-27 LAB — BASIC METABOLIC PANEL
BUN: 16 mg/dL (ref 6–23)
CO2: 33 mEq/L — ABNORMAL HIGH (ref 19–32)
Calcium: 9.4 mg/dL (ref 8.4–10.5)
Chloride: 105 mEq/L (ref 96–112)
Creatinine, Ser: 0.7 mg/dL (ref 0.4–1.2)
GFR: 88.97 mL/min (ref >60.00)
Glucose, Bld: 117 mg/dL — ABNORMAL HIGH (ref 70–99)
Potassium: 4 mEq/L (ref 3.5–5.1)
Sodium: 144 mEq/L (ref 135–145)

## 2010-12-27 LAB — MICROALBUMIN / CREATININE URINE RATIO
Creatinine,U: 122.3 mg/dL
Microalb Creat Ratio: 0.6 mg/g (ref 0.0–30.0)
Microalb, Ur: 0.7 mg/dL (ref 0.0–1.9)

## 2010-12-27 LAB — HEMOGLOBIN A1C: Hgb A1c MFr Bld: 5.9 % (ref 4.6–6.5)

## 2010-12-28 ENCOUNTER — Encounter: Payer: Self-pay | Admitting: Internal Medicine

## 2011-01-02 ENCOUNTER — Encounter: Payer: Self-pay | Admitting: Family

## 2011-01-02 ENCOUNTER — Ambulatory Visit: Payer: 59 | Admitting: Internal Medicine

## 2011-01-02 ENCOUNTER — Other Ambulatory Visit: Payer: Self-pay | Admitting: Internal Medicine

## 2011-01-02 ENCOUNTER — Ambulatory Visit (INDEPENDENT_AMBULATORY_CARE_PROVIDER_SITE_OTHER): Payer: 59 | Admitting: Family

## 2011-01-02 ENCOUNTER — Telehealth: Payer: Self-pay | Admitting: Internal Medicine

## 2011-01-02 DIAGNOSIS — I10 Essential (primary) hypertension: Secondary | ICD-10-CM

## 2011-01-02 DIAGNOSIS — E119 Type 2 diabetes mellitus without complications: Secondary | ICD-10-CM

## 2011-01-02 DIAGNOSIS — F329 Major depressive disorder, single episode, unspecified: Secondary | ICD-10-CM

## 2011-01-02 NOTE — Telephone Encounter (Signed)
LFT(hyperlipidemia) , A1C (250.00)

## 2011-01-02 NOTE — Assessment & Plan Note (Signed)
This is clinically stable and is being managed by psychiatry.

## 2011-01-02 NOTE — Telephone Encounter (Signed)
Rx refill sent to pharmacy. 

## 2011-01-02 NOTE — Telephone Encounter (Signed)
Please advise 

## 2011-01-02 NOTE — Progress Notes (Signed)
Subjective:    Patient ID: [name] [name], female    DOB: 02-07-1951, 60 y.o.   MRN: [id number]  HPI  DM2- reports that her diet "isn't what it should be."  Not exercising due to bunion surgery 6/5. A1C was 5.9.  Patient presents today for followup of hypertension.  Patient has been treated for Chronic HTN for quite sometime. She is currently on benicar HCT and lopressor, and well controlled. No associated S/S related to HTN.   Quality: chronic Modifying factor: meds Duration: Quite sometime Associated S/S: None.  The patient denies the following associated symptoms: Chest pain, dyspnea, blurred vision, headache, or lower extremity edema.  Depression- she is wellbutrin Dr. Jennelle Human (psychiatry) feels better on the Abilify.    Review of Systems See HPI Past Medical History  Diagnosis Date  . CAD (coronary artery disease)   . Hyperlipidemia   . Hypertension   . Obstructive sleep apnea (adult) (pediatric)   . Esophageal reflux   . Type II or unspecified type diabetes mellitus without mention of complication, not stated as uncontrolled   . Depressive disorder, not elsewhere classified   . Allergic rhinitis, cause unspecified     History   Social History  . Marital Status: Married    Spouse Name: N/A    Number of Children: N/A  . Years of Education: N/A   Occupational History  . Data processing manager     [employer]   Social History Main Topics  . Smoking status: Current Everyday Smoker  . Smokeless tobacco: Not on file  . Alcohol Use: Yes     occasional  . Drug Use:   . Sexually Active:    Other Topics Concern  . Not on file   Social History Narrative  . No narrative on file    No past surgical history on file.  No family history on file.  No Known Allergies  Current Outpatient Prescriptions on File Prior to Visit  Medication Sig Dispense Refill  . ARIPiprazole (ABILIFY) 5 MG tablet Take 5 mg by mouth daily.        Marland Kitchen aspirin 81 MG tablet Take 81 mg by mouth  daily.        Marland Kitchen buPROPion (WELLBUTRIN XL) 150 MG 24 hr tablet Take 450 mg by mouth daily.        Marland Kitchen glucose blood (ONE TOUCH TEST STRIPS) test strip 1 each by Other route as needed. Use as instructed       . Insulin Pen Needle (RELION PEN NEEDLE 31G/8MM) 31G X 8 MM MISC by Does not apply route.        . Lancets (ONETOUCH ULTRASOFT) lancets 1 each by Other route daily. Use as instructed       . Liraglutide (VICTOZA) 18 MG/3ML SOLN Inject 1.2 mg into the skin daily.        . metFORMIN (GLUCOPHAGE-XR) 500 MG 24 hr tablet Take 500 mg by mouth 2 (two) times daily.       . metoprolol (LOPRESSOR) 50 MG tablet Take 50 mg by mouth 2 (two) times daily.        Marland Kitchen olmesartan-hydrochlorothiazide (BENICAR HCT) 20-12.5 MG per tablet Take 0.5 tablets by mouth daily.        Marland Kitchen omeprazole (PRILOSEC) 40 MG capsule take 1 capsule by mouth once daily 30 MINUTES BEFORE MORNING MEAL  30 capsule  3  . rosuvastatin (CRESTOR) 20 MG tablet Take 20 mg by mouth daily.        Marland Kitchen  multivitamin (THERAGRAN) per tablet Take 1 tablet by mouth daily.          BP 120/70  Pulse 78  Temp(Src) 98.2 F (36.8 C) (Oral)  Resp 16  Ht 5' 4.02" (1.626 m)  Wt 167 lb (75.751 kg)  BMI 28.65 kg/m2      Objective:   Physical Exam  Constitutional: She appears well-developed and well-nourished.  Cardiovascular: Normal rate and regular rhythm.   Pulmonary/Chest: Effort normal and breath sounds normal.  Musculoskeletal:       Patient is sent have some swelling in the left lower extremity. She is status post bunionectomy on the left. She is still using a boot/walking cast on the left foot.  Psychiatric: She has a normal mood and affect. Her speech is normal and behavior is normal. Judgment and thought content normal. Cognition and memory are normal.          Assessment & Plan:

## 2011-01-02 NOTE — Patient Instructions (Signed)
Please follow up in 4 months- go to the lab one week prior for lab work.  Have a nice summer.

## 2011-01-02 NOTE — Assessment & Plan Note (Signed)
Patient's A1c is at goal. Continue metformin.

## 2011-01-02 NOTE — Telephone Encounter (Signed)
PATIENT NEEDS AN APPOINTMENT FOR LABS AT [facility] THE WEEK OF 11-12

## 2011-01-02 NOTE — Assessment & Plan Note (Signed)
BP Readings from Last 3 Encounters:  01/02/11 120/70  07/27/10 110/80  04/06/10 100/70  blood pressure is stable on Benicar HCT and Lopressor. Continue same.

## 2011-01-03 NOTE — Telephone Encounter (Signed)
Appointment has been entered for [facility] lab on 04/23/11.

## 2011-01-22 ENCOUNTER — Ambulatory Visit: Payer: 59 | Admitting: Internal Medicine

## 2011-03-21 ENCOUNTER — Ambulatory Visit (INDEPENDENT_AMBULATORY_CARE_PROVIDER_SITE_OTHER): Payer: 59 | Admitting: Family

## 2011-03-21 ENCOUNTER — Encounter: Payer: Self-pay | Admitting: Family

## 2011-03-21 DIAGNOSIS — H1030 Unspecified acute conjunctivitis, unspecified eye: Secondary | ICD-10-CM | POA: Insufficient documentation

## 2011-03-21 DIAGNOSIS — H109 Unspecified conjunctivitis: Secondary | ICD-10-CM

## 2011-03-21 MED ORDER — CIPROFLOXACIN HCL 0.3 % OP SOLN: 1.0000 [drp] | OPHTHALMIC | Status: AC

## 2011-03-21 NOTE — Patient Instructions (Signed)
Call if symptoms worsen or if not resolved in 2-3 days.

## 2011-03-21 NOTE — Assessment & Plan Note (Signed)
Will plan to treat empirically with ciloxan.  We did discuss that this may be allergic conjunctivitis and I also recommended that she start claritin once daily. She is instructed to call if symptoms worsen or if they do not improve.  I have advised her to schedule a physical.

## 2011-03-21 NOTE — Progress Notes (Signed)
Subjective:    Patient ID: [name] [name], female    DOB: 05/05/1951, 60 y.o.   MRN: [id number]  HPI  [name] [name] is a 60 yr old female who presents today with chief complaint of eye drainage. Yesterday developed redness in the right eye.  Notes that she has had some crusting.  Now left eye is affected.  Denies problems with vision.   Denies itching of her eyes.   Review of Systems See HPI  Past Medical History  Diagnosis Date  . CAD (coronary artery disease)   . Hyperlipidemia   . Hypertension   . Obstructive sleep apnea (adult) (pediatric)   . Esophageal reflux   . Type II or unspecified type diabetes mellitus without mention of complication, not stated as uncontrolled   . Depressive disorder, not elsewhere classified   . Allergic rhinitis, cause unspecified     History   Social History  . Marital Status: Married    Spouse Name: N/A    Number of Children: N/A  . Years of Education: N/A   Occupational History  . Data processing manager     [employer]   Social History Main Topics  . Smoking status: Current Everyday Smoker  . Smokeless tobacco: Never Used  . Alcohol Use: Yes     occasional  . Drug Use: Not on file  . Sexually Active: Not on file   Other Topics Concern  . Not on file   Social History Narrative  . No narrative on file    No past surgical history on file.  No family history on file.  No Known Allergies  Current Outpatient Prescriptions on File Prior to Visit  Medication Sig Dispense Refill  . ARIPiprazole (ABILIFY) 5 MG tablet Take 5 mg by mouth daily.        Marland Kitchen aspirin 81 MG tablet Take 81 mg by mouth daily.        Marland Kitchen buPROPion (WELLBUTRIN XL) 150 MG 24 hr tablet Take 450 mg by mouth daily.        . Insulin Pen Needle (RELION PEN NEEDLE 31G/8MM) 31G X 8 MM MISC by Does not apply route.        . Lancets (ONETOUCH ULTRASOFT) lancets 1 each by Other route daily. Use as instructed       . Liraglutide (VICTOZA) 18 MG/3ML SOLN Inject 1.2 mg into the skin  daily.        . metFORMIN (GLUCOPHAGE-XR) 500 MG 24 hr tablet Take 500 mg by mouth 2 (two) times daily.       . metoprolol (LOPRESSOR) 50 MG tablet Take 50 mg by mouth 2 (two) times daily.        . multivitamin (THERAGRAN) per tablet Take 1 tablet by mouth daily.        Marland Kitchen olmesartan-hydrochlorothiazide (BENICAR HCT) 20-12.5 MG per tablet Take 0.5 tablets by mouth daily.        Marland Kitchen omeprazole (PRILOSEC) 40 MG capsule take 1 capsule by mouth once daily 30 MINUTES BEFORE MORNING MEAL  30 capsule  3  . rosuvastatin (CRESTOR) 20 MG tablet Take 20 mg by mouth daily.        Marland Kitchen glucose blood (ONE TOUCH TEST STRIPS) test strip 1 each by Other route as needed. Use as instructed         BP 100/72  Pulse 66  Temp(Src) 98.1 F (36.7 C) (Oral)  Resp 18  Wt 164 lb (74.39 kg)  SpO2 99%  Objective:   Physical Exam  Constitutional: She appears well-developed and well-nourished. No distress.  Eyes:       + bilateral tearing noted.  Mild bilateral injection.  No crusting  Cardiovascular: Normal rate.   No murmur heard. Pulmonary/Chest: Effort normal and breath sounds normal. No respiratory distress. She has no wheezes. She has no rales. She exhibits no tenderness.          Assessment & Plan:

## 2011-04-18 ENCOUNTER — Other Ambulatory Visit: Payer: Self-pay | Admitting: Internal Medicine

## 2011-04-21 ENCOUNTER — Other Ambulatory Visit: Payer: Self-pay | Admitting: Internal Medicine

## 2011-04-23 ENCOUNTER — Other Ambulatory Visit: Payer: 59

## 2011-04-27 ENCOUNTER — Ambulatory Visit (INDEPENDENT_AMBULATORY_CARE_PROVIDER_SITE_OTHER): Payer: 59 | Admitting: Cardiology

## 2011-04-27 ENCOUNTER — Encounter: Payer: Self-pay | Admitting: Cardiology

## 2011-04-27 DIAGNOSIS — I2510 Atherosclerotic heart disease of native coronary artery without angina pectoris: Secondary | ICD-10-CM

## 2011-04-27 DIAGNOSIS — E119 Type 2 diabetes mellitus without complications: Secondary | ICD-10-CM

## 2011-04-27 DIAGNOSIS — F17200 Nicotine dependence, unspecified, uncomplicated: Secondary | ICD-10-CM

## 2011-04-27 DIAGNOSIS — I10 Essential (primary) hypertension: Secondary | ICD-10-CM

## 2011-04-27 DIAGNOSIS — E785 Hyperlipidemia, unspecified: Secondary | ICD-10-CM

## 2011-04-27 NOTE — Assessment & Plan Note (Signed)
Continue aspirin and statin. Schedule Myoview for risk stratification. 

## 2011-04-27 NOTE — Progress Notes (Signed)
HPI:[name] [name] is a very pleasant female who has a history of coronary disease.  She has had a prior PCI of her diagonal.  Her most recent catheterization in April of 2004 showed no obstructive disease, and her ejection fraction was normal. Her most recent Myoview was performed in June of 2009. Her ejection fraction was normal. There was breast attenuation but no ischemia. I last saw her in July of 2011. Since then she does have dyspnea with more moderate exertion relieved with rest. There is no associated chest pain. There is no orthopnea, PND or pedal edema. There is no palpitations or syncope. She does continue to smoke. Patient recently lost her sister.  Current Outpatient Prescriptions  Medication Sig Dispense Refill  . ARIPiprazole (ABILIFY) 5 MG tablet Take 5 mg by mouth daily.        Marland Kitchen aspirin 81 MG tablet Take 81 mg by mouth daily.        Marland Kitchen buPROPion (WELLBUTRIN XL) 150 MG 24 hr tablet Take 450 mg by mouth daily.        Marland Kitchen glucose blood (ONE TOUCH TEST STRIPS) test strip 1 each by Other route as needed. Use as instructed       . Insulin Pen Needle (RELION PEN NEEDLE 31G/8MM) 31G X 8 MM MISC by Does not apply route.        . Lancets (ONETOUCH ULTRASOFT) lancets 1 each by Other route daily. Use as instructed       . loratadine (CLARITIN) 10 MG tablet Take 10 mg by mouth daily.        . metFORMIN (GLUCOPHAGE-XR) 500 MG 24 hr tablet Take 500 mg by mouth 2 (two) times daily.       . metoprolol (LOPRESSOR) 50 MG tablet take 1 tablet by mouth twice a day  60 tablet  1  . multivitamin (THERAGRAN) per tablet Take 1 tablet by mouth daily.        Marland Kitchen olmesartan-hydrochlorothiazide (BENICAR HCT) 20-12.5 MG per tablet Take 0.5 tablets by mouth daily.        Marland Kitchen omeprazole (PRILOSEC) 40 MG capsule take 1 capsule by mouth once daily 30 MINUTES BEFORE MORNING MEAL  30 capsule  3  . rosuvastatin (CRESTOR) 20 MG tablet Take 20 mg by mouth daily.        Marland Kitchen VICTOZA 18 MG/3ML SOLN inject 1.2 milligram subcutaneously  once daily  3 mL  1     Past Medical History  Diagnosis Date  . CAD (coronary artery disease)   . Hyperlipidemia   . Hypertension   . Obstructive sleep apnea (adult) (pediatric)   . Esophageal reflux   . Type II or unspecified type diabetes mellitus without mention of complication, not stated as uncontrolled   . Depressive disorder, not elsewhere classified   . Allergic rhinitis, cause unspecified     No past surgical history on file.  History   Social History  . Marital Status: Married    Spouse Name: N/A    Number of Children: N/A  . Years of Education: N/A   Occupational History  . Data processing manager     [employer]   Social History Main Topics  . Smoking status: Current Everyday Smoker  . Smokeless tobacco: Never Used  . Alcohol Use: Yes     occasional  . Drug Use: Not on file  . Sexually Active: Not on file   Other Topics Concern  . Not on file   Social History Narrative  .  No narrative on file    ROS: allergies but no fevers or chills, productive cough, hemoptysis, dysphasia, odynophagia, melena, hematochezia, dysuria, hematuria, rash, seizure activity, orthopnea, PND, pedal edema, claudication. Remaining systems are negative.  Physical Exam: Well-developed well-nourished in no acute distress.  Skin is warm and dry.  HEENT is normal.  Neck is supple. No thyromegaly.  Chest is diffuse rhonchi Cardiovascular exam is regular rate and rhythm.  Abdominal exam nontender or distended. No masses palpated. Extremities show no edema. neuro grossly intact  ECG normal sinus rhythm with no ST changes.

## 2011-04-27 NOTE — Assessment & Plan Note (Signed)
Continue statin. Lipids and liver monitored by primary care. 

## 2011-04-27 NOTE — Assessment & Plan Note (Signed)
Management per primary care. 

## 2011-04-27 NOTE — Assessment & Plan Note (Signed)
Blood pressure controlled. Continue present medications. 

## 2011-04-27 NOTE — Assessment & Plan Note (Signed)
Patient counseled on discontinuing. 

## 2011-04-27 NOTE — Patient Instructions (Signed)
Your physician wants you to follow-up in: ONE YEAR You will receive a reminder letter in the mail two months in advance. If you don't receive a letter, please call our office to schedule the follow-up appointment.   Your physician has requested that you have a lexiscan myoview. For further information please visit [email or web address] Please follow instruction sheet, as given.

## 2011-05-01 ENCOUNTER — Ambulatory Visit: Payer: 59 | Admitting: Family

## 2011-05-08 ENCOUNTER — Encounter: Payer: 59 | Admitting: Family

## 2011-05-08 ENCOUNTER — Ambulatory Visit: Payer: 59 | Admitting: Family

## 2011-05-09 ENCOUNTER — Ambulatory Visit (INDEPENDENT_AMBULATORY_CARE_PROVIDER_SITE_OTHER): Payer: 59 | Admitting: Family

## 2011-05-09 ENCOUNTER — Ambulatory Visit (HOSPITAL_BASED_OUTPATIENT_CLINIC_OR_DEPARTMENT_OTHER)
Admission: RE | Admit: 2011-05-09 | Discharge: 2011-05-09 | Disposition: A | Discharge status: Home / Self Care | Payer: 59 | Source: Ambulatory Visit | Attending: Family | Admitting: Family

## 2011-05-09 ENCOUNTER — Encounter: Payer: Self-pay | Admitting: Family

## 2011-05-09 ENCOUNTER — Other Ambulatory Visit (HOSPITAL_COMMUNITY)
Admission: RE | Admit: 2011-05-09 | Discharge: 2011-05-09 | Disposition: A | Discharge status: Home / Self Care | Payer: 59 | Source: Ambulatory Visit | Attending: Family | Admitting: Family

## 2011-05-09 DIAGNOSIS — E119 Type 2 diabetes mellitus without complications: Secondary | ICD-10-CM

## 2011-05-09 DIAGNOSIS — Z01419 Encounter for gynecological examination (general) (routine) without abnormal findings: Secondary | ICD-10-CM | POA: Insufficient documentation

## 2011-05-09 DIAGNOSIS — B359 Dermatophytosis, unspecified: Secondary | ICD-10-CM

## 2011-05-09 DIAGNOSIS — R87810 Cervical high risk human papillomavirus (HPV) DNA test positive: Secondary | ICD-10-CM | POA: Insufficient documentation

## 2011-05-09 DIAGNOSIS — Z0000 Encounter for general adult medical examination without abnormal findings: Secondary | ICD-10-CM | POA: Insufficient documentation

## 2011-05-09 DIAGNOSIS — I10 Essential (primary) hypertension: Secondary | ICD-10-CM

## 2011-05-09 DIAGNOSIS — F329 Major depressive disorder, single episode, unspecified: Secondary | ICD-10-CM

## 2011-05-09 DIAGNOSIS — E7800 Pure hypercholesterolemia, unspecified: Secondary | ICD-10-CM

## 2011-05-09 DIAGNOSIS — I2510 Atherosclerotic heart disease of native coronary artery without angina pectoris: Secondary | ICD-10-CM

## 2011-05-09 DIAGNOSIS — E785 Hyperlipidemia, unspecified: Secondary | ICD-10-CM

## 2011-05-09 DIAGNOSIS — Z23 Encounter for immunization: Secondary | ICD-10-CM

## 2011-05-09 DIAGNOSIS — D126 Benign neoplasm of colon, unspecified: Secondary | ICD-10-CM

## 2011-05-09 DIAGNOSIS — J309 Allergic rhinitis, unspecified: Secondary | ICD-10-CM

## 2011-05-09 DIAGNOSIS — Z1231 Encounter for screening mammogram for malignant neoplasm of breast: Secondary | ICD-10-CM | POA: Insufficient documentation

## 2011-05-09 DIAGNOSIS — K635 Polyp of colon: Secondary | ICD-10-CM

## 2011-05-09 LAB — HEMOGLOBIN A1C
Hgb A1c MFr Bld: 5.7 % — ABNORMAL HIGH (ref <5.7)
Mean Plasma Glucose: 117 mg/dL — ABNORMAL HIGH (ref <117)

## 2011-05-09 LAB — CBC
HCT: 43.3 % (ref 36.0–46.0)
Hemoglobin: 14.3 g/dL (ref 12.0–15.0)
MCH: 29.2 pg (ref 26.0–34.0)
MCHC: 33 g/dL (ref 30.0–36.0)
MCV: 88.5 fL (ref 78.0–100.0)
Platelets: 185 10*3/uL (ref 150–400)
RBC: 4.89 MIL/uL (ref 3.87–5.11)
RDW: 14.3 % (ref 11.5–15.5)
WBC: 4.9 10*3/uL (ref 4.0–10.5)

## 2011-05-09 LAB — HEPATIC FUNCTION PANEL
ALT: 18 U/L (ref 0–35)
AST: 13 U/L (ref 0–37)
Albumin: 4.4 g/dL (ref 3.5–5.2)
Alkaline Phosphatase: 72 U/L (ref 39–117)
Bilirubin, Direct: 0.1 mg/dL (ref 0.0–0.3)
Indirect Bilirubin: 0.4 mg/dL (ref 0.0–0.9)
Total Bilirubin: 0.5 mg/dL (ref 0.3–1.2)
Total Protein: 6.5 g/dL (ref 6.0–8.3)

## 2011-05-09 LAB — TSH: TSH: 0.628 u[IU]/mL (ref 0.350–4.500)

## 2011-05-09 LAB — LIPID PANEL
Cholesterol: 113 mg/dL (ref 0–200)
HDL: 48 mg/dL (ref >39)
LDL Cholesterol: 46 mg/dL (ref 0–99)
Total CHOL/HDL Ratio: 2.4 Ratio
Triglycerides: 97 mg/dL (ref <150)
VLDL: 19 mg/dL (ref 0–40)

## 2011-05-09 LAB — BASIC METABOLIC PANEL
BUN: 11 mg/dL (ref 6–23)
CO2: 28 mEq/L (ref 19–32)
Calcium: 9.6 mg/dL (ref 8.4–10.5)
Chloride: 106 mEq/L (ref 96–112)
Creat: 0.76 mg/dL (ref 0.50–1.10)
Glucose, Bld: 97 mg/dL (ref 70–99)
Potassium: 4.4 mEq/L (ref 3.5–5.3)
Sodium: 144 mEq/L (ref 135–145)

## 2011-05-09 MED ORDER — TERBINAFINE HCL 250 MG PO TABS: ORAL_TABLET | ORAL | Status: DC

## 2011-05-09 NOTE — Assessment & Plan Note (Signed)
Stable on Wellbutrin and Abilify. This is managed by Dr. Jennelle Human.

## 2011-05-09 NOTE — Assessment & Plan Note (Signed)
Tolerating statin without difficulty. Obtain LFT and FLP.

## 2011-05-09 NOTE — Assessment & Plan Note (Signed)
Pt with ringworm on right thigh.  Treat with lamisil.

## 2011-05-09 NOTE — Assessment & Plan Note (Addendum)
Clinically stable on victoza and metformin. Obtain A1C. Pt will make apt with her opthalmalogist for routine diabetic eye exam.

## 2011-05-09 NOTE — Assessment & Plan Note (Signed)
No cardiac complaints today. She is followed by Dr. Jens Som. On beta blocker, aspirin.

## 2011-05-09 NOTE — Assessment & Plan Note (Signed)
Pt referred for mammogram, dexa and follow up colo today. Obtain fasting laboratories. Pap smear obtained today.  Pt counseled on healthy diet. Declines flu shot.

## 2011-05-09 NOTE — Progress Notes (Signed)
Subjective:    Patient ID: [name] [name], female    DOB: 28-Aug-1950, 60 y.o.   MRN: [id number]  HPI  Ms.  [name] is a 60 yr old female who presents today for her physical and for follow up of her chronic medical issues.  1) Preventative-Last colonoscopy was 5/09.  Last mammogram was 11/11 and was negative.  She declines flu shot. She is due for zostavax, pneumovax and tetanus.  Last pap was "years ago." Denies hx of abnormal pap smear. She reports that she does not follow a good diabetic diet.  Not exercising.    2) DM2- she is up to date on microalbumin. She checks her sugars sporadically- usually in the 90's fasting. Last eye exam was 4-5 years ago.  She sees Dr.  Hubbard Robinson.  She will call to arrange an eye exam.  3) Hyperlipidemia- her last lipid panel 9 months ago showed an LDL of 48.  She continues on crestor. Denies myalgias.   4) HTN- On Benicar HCT. Denies cough.    5) CAD- she is followed by Dr. Jens Som and is maintained on aspirin and a beta blocker.   6) Depression- currently following with Dr. Jennelle Human, she is on wellbutrin and abilify. She reports that her depression is well controlled on this regimen.       Review of Systems  Constitutional: Negative for unexpected weight change.  HENT: Positive for hearing loss.   Eyes: Negative for visual disturbance.  Respiratory: Negative for cough and chest tightness.   Cardiovascular: Negative for chest pain.  Gastrointestinal: Positive for constipation. Negative for nausea, vomiting and diarrhea.  Genitourinary: Negative for dysuria, frequency and hematuria.  Musculoskeletal: Negative for myalgias and arthralgias.  Skin: Positive for rash.       + rash on right thigh  Neurological: Negative for headaches.  Hematological: Negative for adenopathy.  Psychiatric/Behavioral:       See HPI   Past Medical History  Diagnosis Date  . CAD (coronary artery disease)   . Hyperlipidemia   . Hypertension   . Obstructive sleep apnea  (adult) (pediatric)   . Esophageal reflux   . Type II or unspecified type diabetes mellitus without mention of complication, not stated as uncontrolled   . Depressive disorder, not elsewhere classified   . Allergic rhinitis, cause unspecified     History   Social History  . Marital Status: Married    Spouse Name: N/A    Number of Children: 3  . Years of Education: N/A   Occupational History  . Data processing manager     [employer]   Social History Main Topics  . Smoking status: Current Everyday Smoker  . Smokeless tobacco: Never Used  . Alcohol Use: Yes     occasional  . Drug Use: Not on file  . Sexually Active: Not on file   Other Topics Concern  . Not on file   Social History Narrative   Caffeine Use:  2 cups coffee dailyRegular exercise:  No    No past surgical history on file.  No family history on file.  No Known Allergies  Current Outpatient Prescriptions on File Prior to Visit  Medication Sig Dispense Refill  . ARIPiprazole (ABILIFY) 5 MG tablet Take 5 mg by mouth daily.        Marland Kitchen aspirin 81 MG tablet Take 81 mg by mouth daily.        Marland Kitchen buPROPion (WELLBUTRIN XL) 150 MG 24 hr tablet Take 450 mg by mouth daily.        Marland Kitchen  glucose blood (ONE TOUCH TEST STRIPS) test strip 1 each by Other route as needed. Use as instructed       . Insulin Pen Needle (RELION PEN NEEDLE 31G/8MM) 31G X 8 MM MISC by Does not apply route.        . Lancets (ONETOUCH ULTRASOFT) lancets 1 each by Other route daily. Use as instructed       . loratadine (CLARITIN) 10 MG tablet Take 10 mg by mouth daily.        . metFORMIN (GLUCOPHAGE-XR) 500 MG 24 hr tablet Take 500 mg by mouth 2 (two) times daily.       . metoprolol (LOPRESSOR) 50 MG tablet take 1 tablet by mouth twice a day  60 tablet  1  . multivitamin (THERAGRAN) per tablet Take 1 tablet by mouth daily.        Marland Kitchen olmesartan-hydrochlorothiazide (BENICAR HCT) 20-12.5 MG per tablet Take 0.5 tablets by mouth daily.        Marland Kitchen omeprazole (PRILOSEC) 40  MG capsule take 1 capsule by mouth once daily 30 MINUTES BEFORE MORNING MEAL  30 capsule  3  . rosuvastatin (CRESTOR) 20 MG tablet Take 20 mg by mouth daily.        Marland Kitchen VICTOZA 18 MG/3ML SOLN inject 1.2 milligram subcutaneously once daily  3 mL  1    BP 122/78  Pulse 66  Temp(Src) 97.8 F (36.6 C) (Oral)  Resp 16  Ht 5' 4.5" (1.638 m)  Wt 166 lb 1.3 oz (75.333 kg)  BMI 28.07 kg/m2       Objective:   Physical Exam  Constitutional: She appears well-developed and well-nourished. No distress.  HENT:  Head: Normocephalic and atraumatic.  Right Ear: Tympanic membrane and ear canal normal.  Left Ear: Tympanic membrane and ear canal normal.  Mouth/Throat: No posterior oropharyngeal edema or posterior oropharyngeal erythema.  Eyes: No scleral icterus.  Neck: Normal range of motion. Neck supple. No thyromegaly present.  Cardiovascular: Normal rate and regular rhythm.   No murmur heard. Pulmonary/Chest: Effort normal and breath sounds normal. No respiratory distress. She has no wheezes. She has no rales. She exhibits no tenderness.  Abdominal: Soft. Bowel sounds are normal. She exhibits no distension and no mass. There is no tenderness. There is no rebound and no guarding.  Genitourinary:       Breasts: Examined lying.  Right: Without masses, retractions, discharge or axillary adenopathy.  Left: Without masses, retractions, discharge or axillary adenopathy.  Inguinal/mons: Normal without inguinal adenopathy  External genitalia: Normal  BUS/Urethra/Skene's glands: Normal  Bladder: Normal  Vagina: Normal  Cervix: Normal  Uterus: normal in size, shape and contour. Midline and mobile  Adnexa/parametria:  Rt: Without masses or tenderness.  Lt: Without masses or tenderness.  Anus and perineum: Normal Exam performed with female student present- Heath Lark Pap performed.  Musculoskeletal:       Bilateral upper extrem/lower ext strength is 5/5  Skin: Skin is warm and dry.           echymosis noted on left leg.  approx 1 inch diameter lesion noted on right thigh. Raised anular.  Psychiatric: She has a normal mood and affect. Her speech is normal and behavior is normal. Judgment and thought content normal. Cognition and memory are normal.          Assessment & Plan:

## 2011-05-09 NOTE — Patient Instructions (Signed)
Please schedule your mammogram on the first floor today. Call Dr. Hubbard Robinson to schedule your Eye examination. Schedule bone density at the front desk. Complete blood work prior to leaving. You will be contacted about your follow up colonoscopy. Follow up in 3 months. Have a nice Holiday!

## 2011-05-09 NOTE — Assessment & Plan Note (Signed)
Well controlled on benicar HCT.  Continue. Obtain BMET.

## 2011-05-10 ENCOUNTER — Encounter: Payer: Self-pay | Admitting: Gastroenterology

## 2011-05-10 ENCOUNTER — Encounter: Payer: Self-pay | Admitting: Family

## 2011-05-15 ENCOUNTER — Other Ambulatory Visit: Payer: Self-pay | Admitting: Internal Medicine

## 2011-05-15 ENCOUNTER — Telehealth: Payer: Self-pay | Admitting: Family

## 2011-05-15 DIAGNOSIS — IMO0001 Reserved for inherently not codable concepts without codable children: Secondary | ICD-10-CM

## 2011-05-15 NOTE — Telephone Encounter (Signed)
Reviewed Pap smear results- ASCUS positive for high risk HPV.

## 2011-05-16 ENCOUNTER — Ambulatory Visit (HOSPITAL_COMMUNITY): Payer: 59 | Attending: Cardiovascular Disease | Admitting: Radiology

## 2011-05-16 VITALS — Ht 64.0 in | Wt 166.0 lb

## 2011-05-16 DIAGNOSIS — F17200 Nicotine dependence, unspecified, uncomplicated: Secondary | ICD-10-CM | POA: Insufficient documentation

## 2011-05-16 DIAGNOSIS — R0989 Other specified symptoms and signs involving the circulatory and respiratory systems: Secondary | ICD-10-CM | POA: Insufficient documentation

## 2011-05-16 DIAGNOSIS — E785 Hyperlipidemia, unspecified: Secondary | ICD-10-CM | POA: Insufficient documentation

## 2011-05-16 DIAGNOSIS — R0602 Shortness of breath: Secondary | ICD-10-CM

## 2011-05-16 DIAGNOSIS — E119 Type 2 diabetes mellitus without complications: Secondary | ICD-10-CM | POA: Insufficient documentation

## 2011-05-16 DIAGNOSIS — I10 Essential (primary) hypertension: Secondary | ICD-10-CM | POA: Insufficient documentation

## 2011-05-16 DIAGNOSIS — R0609 Other forms of dyspnea: Secondary | ICD-10-CM | POA: Insufficient documentation

## 2011-05-16 DIAGNOSIS — I2510 Atherosclerotic heart disease of native coronary artery without angina pectoris: Secondary | ICD-10-CM

## 2011-05-16 DIAGNOSIS — Z8249 Family history of ischemic heart disease and other diseases of the circulatory system: Secondary | ICD-10-CM | POA: Insufficient documentation

## 2011-05-16 MED ORDER — REGADENOSON 0.4 MG/5ML IV SOLN
0.4000 mg | Freq: Once | INTRAVENOUS | Status: AC
  Administered 2011-05-16: 0.4 mg via INTRAVENOUS

## 2011-05-16 MED ORDER — TECHNETIUM TC 99M TETROFOSMIN IV KIT
33.0000 | PACK | Freq: Once | INTRAVENOUS | Status: AC | PRN
  Administered 2011-05-16: 33 via INTRAVENOUS

## 2011-05-16 MED ORDER — TECHNETIUM TC 99M TETROFOSMIN IV KIT
11.0000 | PACK | Freq: Once | INTRAVENOUS | Status: AC | PRN
  Administered 2011-05-16: 11 via INTRAVENOUS

## 2011-05-16 NOTE — Progress Notes (Signed)
[facility] SITE 3 NUCLEAR MED [address] [place] [address] [phone]  Cardiology Nuclear Med Study  [name] [name] is a 60 y.o. female [id number] 1951-02-06   Nuclear Med Background Indication for Stress Test:  Evaluation for Ischemia, Stent Patency and PTCA Patency History:  '03 MI> PTCA/Stent LAD(DX), 4/04 Cath: N/O CAD, Patent stent, '06 Echo :EF= NL, 6/09 Myocardial Perfusion Study: (-) ischemia, and OSA Cardiac Risk Factors:Strong Family History - CAD, Hypertension, Lipids, NIDDM and Smoker  Symptoms:  DOE   Nuclear Pre-Procedure Caffeine/Decaff Intake:  None NPO After: 9:00pm   Lungs:  Clear IV 0.9% NS with Angio Cath:  22g  IV Site: R Hand  IV Started by:  Bonnita Levan, RN  Chest Size (in):  38 Cup Size: C  Height: 5\' 4"  (1.626 m)  Weight:  166 lb (75.297 kg)  BMI:  Body mass index is 28.49 kg/(m^2). Tech Comments: Patient held all Meds this AM, BS-122@10  AM    Nuclear Med Study 1 or 2 day study: 1 day  Stress Test Type:  Eugenie Birks  Reading MD: Kristeen Miss, MD  Order Authorizing Provider:  Olga Millers, MD  Resting Radionuclide: Technetium 7m Tetrofosmin  Resting Radionuclide Dose: 11.0 mCi   Stress Radionuclide:  Technetium 83m Tetrofosmin  Stress Radionuclide Dose: 33.0 mCi           Stress Protocol Rest HR: 71 Stress HR: 108  Rest BP: 148/85 Stress BP: 176/82  Exercise Time (min): 2:00 METS: n/a   Predicted Max HR: 159 bpm % Max HR: 67.92 bpm Rate Pressure Product: 21308   Dose of Adenosine (mg):  n/a Dose of Lexiscan: 0.4 mg  Dose of Atropine (mg): n/a Dose of Dobutamine: n/a mcg/kg/min (at max HR)  Stress Test Technologist: Irean Hong, RN  Nuclear Technologist:  Doyne Keel, CNMT     Rest Procedure:  Myocardial perfusion imaging was performed at rest 45 minutes following the intravenous administration of Technetium 38m Tetrofosmin. Rest ECG: NSR  Stress Procedure:  The patient received IV Lexiscan 0.4 mg over  15-seconds with concurrent low level exercise. Technetium 42m Tetrofosmin was injected at 30-seconds while the patient continued walking.  There were nonspecific changes with Lexiscan.  Quantitative spect images were obtained after a 45-minute delay. Stress ECG: No significant change from baseline ECG  QPS Raw Data Images:  Normal; no motion artifact; normal heart/lung ratio. Stress Images:  Normal homogeneous uptake in all areas of the myocardium. Rest Images:  Normal homogeneous uptake in all areas of the myocardium. Subtraction (SDS):  No evidence of ischemia. Transient Ischemic Dilatation (Normal <1.22):  0.97 Lung/Heart Ratio (Normal <0.45):  0.25  Quantitative Gated Spect Images QGS EDV:  74 ml QGS ESV:  17 ml QGS cine images:  NL LV Function; NL Wall Motion QGS EF: 77%  Impression Exercise Capacity:  Lexiscan with low level exercise. BP Response:  Normal blood pressure response. Clinical Symptoms:  No chest pain. ECG Impression:  No significant ST segment change suggestive of ischemia. Comparison with Prior Nuclear Study: No significant change from previous study  Overall Impression:  Normal stress nuclear study.  There is no evidence of ischemia.  The LV function is normal with an EF of 77%.   Vesta Mixer, Montez Hageman., MD, [facility] 05/16/2011, 6:00 PM

## 2011-05-16 NOTE — Telephone Encounter (Signed)
Rx refill sent to pharmacy. 

## 2011-05-17 ENCOUNTER — Encounter: Payer: Self-pay | Admitting: Gastroenterology

## 2011-05-17 ENCOUNTER — Ambulatory Visit (AMBULATORY_SURGERY_CENTER): Payer: 59

## 2011-05-17 ENCOUNTER — Telehealth: Payer: Self-pay | Admitting: Family

## 2011-05-17 VITALS — Ht 64.0 in | Wt 162.0 lb

## 2011-05-17 DIAGNOSIS — Z1211 Encounter for screening for malignant neoplasm of colon: Secondary | ICD-10-CM

## 2011-05-17 MED ORDER — PEG-KCL-NACL-NASULF-NA ASC-C 100 G PO SOLR: 1.0000 | Freq: Once | ORAL | Status: DC

## 2011-05-18 NOTE — Telephone Encounter (Signed)
patient returned your call please call her Friday 05-18-2011.

## 2011-05-18 NOTE — Telephone Encounter (Signed)
Pt returned your call and can be reached at [phone] Ext [phone].

## 2011-05-18 NOTE — Telephone Encounter (Signed)
Tried home and cell. No answer.  Will try again later.

## 2011-05-18 NOTE — Telephone Encounter (Signed)
Spoke with pt re: abnormal pap and importance of her following up with GYN.  She is requesting GYN in [place].  I have advised her to call us if she has not heard back about her GYN referral in 1 week. She verbalizes understanding.

## 2011-05-18 NOTE — Telephone Encounter (Signed)
See phone note from 05/15/11.

## 2011-05-30 ENCOUNTER — Encounter: Payer: Self-pay | Admitting: Gastroenterology

## 2011-05-30 ENCOUNTER — Ambulatory Visit (AMBULATORY_SURGERY_CENTER): Payer: 59 | Admitting: Gastroenterology

## 2011-05-30 VITALS — BP 140/78 | HR 70 | Temp 98.8°F | Resp 20 | Ht 64.0 in | Wt 162.0 lb

## 2011-05-30 DIAGNOSIS — K648 Other hemorrhoids: Secondary | ICD-10-CM

## 2011-05-30 DIAGNOSIS — Z1211 Encounter for screening for malignant neoplasm of colon: Secondary | ICD-10-CM

## 2011-05-30 DIAGNOSIS — D126 Benign neoplasm of colon, unspecified: Secondary | ICD-10-CM

## 2011-05-30 DIAGNOSIS — Z86010 Personal history of colonic polyps: Secondary | ICD-10-CM

## 2011-05-30 DIAGNOSIS — K644 Residual hemorrhoidal skin tags: Secondary | ICD-10-CM

## 2011-05-30 LAB — GLUCOSE, CAPILLARY: Glucose-Capillary: 100 mg/dL — ABNORMAL HIGH (ref 70–99)

## 2011-05-30 MED ORDER — SODIUM CHLORIDE 0.9 % IV SOLN: 500.0000 mL | INTRAVENOUS | Status: DC

## 2011-05-30 NOTE — Progress Notes (Signed)
No complaints noted in the recovery room. Maw  Patient did not experience any of the following events: a burn prior to discharge; a fall within the facility; wrong site/side/patient/procedure/implant event; or a hospital transfer or hospital admission upon discharge from the facility. [phone]) Patient did not have preoperative order for IV antibiotic SSI prophylaxis. [phone]) Patient did not have preoperative order for IV antibiotic SSI prophylaxis. [phone])

## 2011-05-30 NOTE — Op Note (Signed)
[facility] [address] [place], [place]  [address]  COLONOSCOPY PROCEDURE REPORT  PATIENT:  Lorree, Millar  MR#:  [id number] BIRTHDATE:  03/16/1951, 60 yrs. old  GENDER:  female ENDOSCOPIST:  Vania Rea. Jarold Motto, MD, [facility] REF. BY: PROCEDURE DATE:  05/30/2011 PROCEDURE:  Colonoscopy with biopsy ASA CLASS:  Class III INDICATIONS:  history of polyps MEDICATIONS:   propofol (Diprivan) 240 mg IV  DESCRIPTION OF PROCEDURE:   After the risks and benefits and of the procedure were explained, informed consent was obtained. Digital rectal exam was performed and revealed no abnormalities. The [id number] endoscope was introduced through the anus and advanced to the cecum, which was identified by both the appendix and ileocecal valve.  The quality of the prep was excellent, using MoviPrep.  The instrument was then slowly withdrawn as the colon was fully examined. <<PROCEDUREIMAGES>>  FINDINGS:  A sessile polyp was found in the descending colon. 4 mm polyp cold snare evcised.descending colon area.  This was otherwise a normal examination of the colon.  Internal and external Hemorrhoids were found.   Retroflexed views in the rectum revealed hemorrhoids.    The scope was then withdrawn from the patient and the procedure completed.  COMPLICATIONS:  None ENDOSCOPIC IMPRESSION: 1) Sessile polyp in the descending colon 2) Otherwise normal examination 3) Internal and external hemorrhoids R/O ADENOMA RECOMMENDATIONS: 1) Titrate to need 2) High fiber diet. 3) Repeat colonoscopy in 5 years if polyp adenomatous; otherwise 10 years  REPEAT EXAM:  No  ______________________________ Vania Rea. Jarold Motto, MD, [facility]  CC:  Sandford Craze FNP  n. Rosalie DoctorMarland Kitchen   Vania Rea. Lamontae Ricardo at 05/30/2011 09:50 AM  Grace Blight, [phone]

## 2011-05-30 NOTE — Patient Instructions (Signed)
See the picture page for your findings from your exam today.  Follow the green and blue discharge instruction sheets the rest of the day.  Resume your prior medications today. Please call if any questions or concerns.  

## 2011-05-31 ENCOUNTER — Telehealth: Payer: Self-pay

## 2011-05-31 NOTE — Telephone Encounter (Signed)

## 2011-06-11 LAB — GLUCOSE, CAPILLARY: Glucose-Capillary: 119 mg/dL — ABNORMAL HIGH (ref 70–99)

## 2011-06-14 ENCOUNTER — Encounter: Payer: Self-pay | Admitting: Gastroenterology

## 2011-06-15 ENCOUNTER — Encounter: Payer: Self-pay | Admitting: Gastroenterology

## 2011-06-18 ENCOUNTER — Encounter: Payer: Self-pay | Admitting: Family

## 2011-06-20 ENCOUNTER — Telehealth: Payer: Self-pay | Admitting: Family

## 2011-06-20 ENCOUNTER — Inpatient Hospital Stay: Admission: RE | Admit: 2011-06-20 | Payer: 59 | Source: Ambulatory Visit

## 2011-06-20 MED ORDER — LIRAGLUTIDE 18 MG/3ML ~~LOC~~ SOLN: 1.2000 mg | Freq: Every day | SUBCUTANEOUS | Status: DC

## 2011-06-20 NOTE — Telephone Encounter (Signed)
Refill sent to pharmacy.   

## 2011-06-20 NOTE — Telephone Encounter (Signed)
Refill- victoza 2-pak 18mg /46ml pen. Inject 1.2 milligrams subcutaneously once daily. Qty 6 last fill 12.12.12

## 2011-07-04 ENCOUNTER — Ambulatory Visit (INDEPENDENT_AMBULATORY_CARE_PROVIDER_SITE_OTHER)
Admission: RE | Admit: 2011-07-04 | Discharge: 2011-07-04 | Disposition: A | Discharge status: Home / Self Care | Payer: 59 | Source: Ambulatory Visit | Attending: Family | Admitting: Family

## 2011-07-04 DIAGNOSIS — Z0000 Encounter for general adult medical examination without abnormal findings: Secondary | ICD-10-CM

## 2011-07-10 ENCOUNTER — Other Ambulatory Visit: Payer: Self-pay | Admitting: *Deleted

## 2011-07-10 MED ORDER — METOPROLOL TARTRATE 50 MG PO TABS: 50.0000 mg | ORAL_TABLET | Freq: Two times a day (BID) | ORAL | Status: DC

## 2011-07-10 NOTE — Telephone Encounter (Signed)
Received call from pt requesting refill of Metoprolol. Advised her we have not received request from pharmacy yet but I would send refill in. Authorization sent for #60 x no refills as pt has f/u on 08/08/11.

## 2011-07-12 ENCOUNTER — Encounter: Payer: Self-pay | Admitting: Family

## 2011-07-13 ENCOUNTER — Telehealth: Payer: Self-pay | Admitting: Family

## 2011-07-13 DIAGNOSIS — M8580 Other specified disorders of bone density and structure, unspecified site: Secondary | ICD-10-CM | POA: Insufficient documentation

## 2011-07-13 NOTE — Telephone Encounter (Signed)
Notified pt. 

## 2011-07-13 NOTE — Telephone Encounter (Signed)
Pls call pt and let her know that her bone density shows osteopenia- mild bone thinning. She should quit smoking if she has not already to help her bone health and add Caltrate 600mg  + D BID and obtain regular exercise.  We will plan to check a vitamin D level at her follow up appointment in February.

## 2011-08-07 ENCOUNTER — Other Ambulatory Visit: Payer: Self-pay | Admitting: Internal Medicine

## 2011-08-08 ENCOUNTER — Telehealth: Payer: Self-pay | Admitting: Family

## 2011-08-08 ENCOUNTER — Ambulatory Visit: Payer: 59 | Admitting: Family

## 2011-08-08 MED ORDER — METOPROLOL TARTRATE 50 MG PO TABS: 50.0000 mg | ORAL_TABLET | Freq: Two times a day (BID) | ORAL | Status: DC

## 2011-08-08 NOTE — Telephone Encounter (Signed)
Refill sent to pharmacy for metoprolol #60 x no refills.

## 2011-08-20 ENCOUNTER — Ambulatory Visit: Payer: 59 | Admitting: Family

## 2011-08-22 ENCOUNTER — Encounter: Payer: Self-pay | Admitting: Family

## 2011-08-22 ENCOUNTER — Ambulatory Visit (INDEPENDENT_AMBULATORY_CARE_PROVIDER_SITE_OTHER): Payer: 59 | Admitting: Family

## 2011-08-22 VITALS — BP 120/70 | HR 70 | Temp 97.6°F | Resp 16 | Ht 64.5 in | Wt 164.1 lb

## 2011-08-22 DIAGNOSIS — K117 Disturbances of salivary secretion: Secondary | ICD-10-CM

## 2011-08-22 DIAGNOSIS — R682 Dry mouth, unspecified: Secondary | ICD-10-CM

## 2011-08-22 DIAGNOSIS — Z9989 Dependence on other enabling machines and devices: Secondary | ICD-10-CM

## 2011-08-22 DIAGNOSIS — E119 Type 2 diabetes mellitus without complications: Secondary | ICD-10-CM

## 2011-08-22 DIAGNOSIS — G4733 Obstructive sleep apnea (adult) (pediatric): Secondary | ICD-10-CM

## 2011-08-22 DIAGNOSIS — I10 Essential (primary) hypertension: Secondary | ICD-10-CM

## 2011-08-22 LAB — BASIC METABOLIC PANEL
BUN: 12 mg/dL (ref 6–23)
CO2: 29 mEq/L (ref 19–32)
Calcium: 9.2 mg/dL (ref 8.4–10.5)
Chloride: 106 mEq/L (ref 96–112)
Creat: 0.73 mg/dL (ref 0.50–1.10)
Glucose, Bld: 97 mg/dL (ref 70–99)
Potassium: 4.1 mEq/L (ref 3.5–5.3)
Sodium: 142 mEq/L (ref 135–145)

## 2011-08-22 LAB — HEMOGLOBIN A1C
Hgb A1c MFr Bld: 5.5 % (ref <5.7)
Mean Plasma Glucose: 111 mg/dL (ref <117)

## 2011-08-22 NOTE — Assessment & Plan Note (Signed)
BP is currently well controled on benicar hct and metoprolol.  Continue same, obtain BMET.

## 2011-08-22 NOTE — Patient Instructions (Signed)
Please complete your blood work prior to leaving today. You will be contacted about your referral to Pulmonology. Please follow up in 3 months.

## 2011-08-22 NOTE — Assessment & Plan Note (Signed)
Seems well controlled on victoza and metformin. Obtain A1C.

## 2011-08-22 NOTE — Assessment & Plan Note (Signed)
I suspect that this is related to her Wellbutrin.  She is happy with how her depression is currently doing on her meds and does not wish to change.  Tells me she will "live with the side effect."  I have advised her to discuss this side effect with Dr. Jennelle Human.

## 2011-08-22 NOTE — Assessment & Plan Note (Signed)
Pt with daytime somnolence.  I cautioned her not to drive while sleepy. Could be related to side effect of abilify. She does not wish to change psych meds.  Will refer back to Dr. Maple Hudson for re-evaluation of her OSA and CPAP.

## 2011-08-22 NOTE — Progress Notes (Signed)
Subjective:    Patient ID: [name] [name], female    DOB: 03/31/1951, 61 y.o.   MRN: [id number]  HPI  Ms.  [name] is a 61 yr old female who presents today for follow up.  DM2- She reports that she has been taking her sugars- preprandial 120's.  Occasionally 93.    HTN-on metoprolol and benicar hct.  Denies sob, chest pain, swelling.    Dry mouth- Dry mouth is not new.    Depression- reports that this is being treated by Dr. Jennelle Human and is currently well controlled with abilify and wellbutrin.   Daytime somnolence- Wears CPap at night.  Reports that she wears CPAP every night for 7 hours.   Reports that she has frequent yawning.  She reports that her sleepiness has waxed/waned.   Review of Systems See HPI  Past Medical History  Diagnosis Date  . CAD (coronary artery disease)   . Hyperlipidemia   . Hypertension   . Obstructive sleep apnea (adult) (pediatric)   . Esophageal reflux   . Type II or unspecified type diabetes mellitus without mention of complication, not stated as uncontrolled   . Depressive disorder, not elsewhere classified   . Allergic rhinitis, cause unspecified   . Personal history of goiter     childhood  . ASCUS on Pap smear     History   Social History  . Marital Status: Married    Spouse Name: N/A    Number of Children: 3  . Years of Education: N/A   Occupational History  . Data processing manager     [employer]   Social History Main Topics  . Smoking status: Current Everyday Smoker  . Smokeless tobacco: Never Used  . Alcohol Use: Yes     occasional  . Drug Use: Not on file  . Sexually Active: Not on file   Other Topics Concern  . Not on file   Social History Narrative   Caffeine Use:  2 cups coffee dailyRegular exercise:  No    Past Surgical History  Procedure Date  . Coronary stent placement 2004  . Goiter removal 1968  . Tubal ligation 1985  . Colposcopy 2012    cervical biopsy (Dr. Juliene Pina)    Family History  Problem Relation Age of  Onset  . Cancer Mother     throat, cervical  . Diabetes Mother   . Stroke Mother   . Heart attack Mother   . Cancer Father     prostate  . Diabetes Sister   . Heart attack Sister   . Hypertension Sister   . Colon cancer Neg Hx     No Known Allergies  Current Outpatient Prescriptions on File Prior to Visit  Medication Sig Dispense Refill  . ARIPiprazole (ABILIFY) 5 MG tablet Take 5 mg by mouth daily.        Marland Kitchen aspirin 81 MG tablet Take 81 mg by mouth daily.        Marland Kitchen buPROPion (WELLBUTRIN XL) 150 MG 24 hr tablet Take 450 mg by mouth daily.        . Calcium Carbonate-Vitamin D (CALTRATE 600+D) 600-400 MG-UNIT per tablet Take 1 tablet by mouth 2 (two) times daily.      . CRESTOR 20 MG tablet take 1 tablet by mouth once daily  30 tablet  1  . glucose blood (ONE TOUCH TEST STRIPS) test strip 1 each by Other route as needed. Use as instructed       . Insulin  Pen Needle (RELION PEN NEEDLE 31G/8MM) 31G X 8 MM MISC by Does not apply route.        . Lancets (ONETOUCH ULTRASOFT) lancets 1 each by Other route daily. Use as instructed       . Liraglutide (VICTOZA) 18 MG/3ML SOLN Inject 0.2 mLs (1.2 mg total) into the skin daily.  6 mL  1  . loratadine (CLARITIN) 10 MG tablet Take 10 mg by mouth daily.        . metFORMIN (GLUCOPHAGE-XR) 500 MG 24 hr tablet take 1 tablet by mouth three times a day  90 tablet  1  . metoprolol (LOPRESSOR) 50 MG tablet Take 1 tablet (50 mg total) by mouth 2 (two) times daily.  60 tablet  0  . multivitamin (THERAGRAN) per tablet Take 1 tablet by mouth daily.        Marland Kitchen olmesartan-hydrochlorothiazide (BENICAR HCT) 20-12.5 MG per tablet Take 0.5 tablets by mouth daily.        Marland Kitchen omeprazole (PRILOSEC) 40 MG capsule take 1 capsule by mouth once daily 30 MINUTES BEFORE MORNING MEAL  30 capsule  3    BP 120/70  Pulse 70  Temp(Src) 97.6 F (36.4 C) (Oral)  Resp 16  Ht 5' 4.5" (1.638 m)  Wt 164 lb 1.3 oz (74.426 kg)  BMI 27.73 kg/m2  SpO2 97%        Objective:    Physical Exam  Constitutional: She appears well-developed and well-nourished. No distress.  Cardiovascular: Normal rate and regular rhythm.   No murmur heard. Pulmonary/Chest: Effort normal and breath sounds normal. No respiratory distress. She has no wheezes. She has no rales. She exhibits no tenderness.  Skin: Skin is warm and dry.  Psychiatric: She has a normal mood and affect. Her behavior is normal. Judgment and thought content normal.          Assessment & Plan:

## 2011-08-24 ENCOUNTER — Telehealth: Payer: Self-pay | Admitting: Family

## 2011-08-24 NOTE — Telephone Encounter (Signed)
Pt notified, voices understanding and scheduled f/u for 11/28/11 at 9:30am.

## 2011-08-24 NOTE — Telephone Encounter (Signed)
Pls call pt and let her know that her diabetes control looks great.  I would like for her to stop metformin for now and we will see how she does on the victoza only. She should follow up in 3 months please.

## 2011-08-24 NOTE — Telephone Encounter (Signed)
Patient returned phone call. Best # [phone]

## 2011-08-24 NOTE — Telephone Encounter (Signed)
Left message on machine to return my call. 

## 2011-08-26 ENCOUNTER — Other Ambulatory Visit: Payer: Self-pay | Admitting: Family

## 2011-09-06 ENCOUNTER — Other Ambulatory Visit: Payer: Self-pay | Admitting: Family

## 2011-09-24 ENCOUNTER — Institutional Professional Consult (permissible substitution): Payer: 59 | Admitting: Internal Medicine

## 2011-09-25 ENCOUNTER — Other Ambulatory Visit: Payer: Self-pay | Admitting: Family

## 2011-10-02 ENCOUNTER — Other Ambulatory Visit: Payer: Self-pay | Admitting: Internal Medicine

## 2011-10-03 ENCOUNTER — Telehealth: Payer: Self-pay | Admitting: Family

## 2011-10-03 MED ORDER — ROSUVASTATIN CALCIUM 20 MG PO TABS: 20.0000 mg | ORAL_TABLET | Freq: Every day | ORAL | Status: DC

## 2011-10-03 NOTE — Telephone Encounter (Signed)
Refill- crestor 20mg  tablet. Take one tablet by mouth once daily. Qty 30 last fill 4.1.13

## 2011-10-04 ENCOUNTER — Institutional Professional Consult (permissible substitution): Payer: 59 | Admitting: Internal Medicine

## 2011-10-05 ENCOUNTER — Encounter: Payer: Self-pay | Admitting: Internal Medicine

## 2011-10-05 ENCOUNTER — Ambulatory Visit (INDEPENDENT_AMBULATORY_CARE_PROVIDER_SITE_OTHER): Payer: 59 | Admitting: Internal Medicine

## 2011-10-05 VITALS — BP 130/80 | HR 70 | Ht 64.0 in | Wt 172.0 lb

## 2011-10-05 DIAGNOSIS — F17200 Nicotine dependence, unspecified, uncomplicated: Secondary | ICD-10-CM

## 2011-10-05 DIAGNOSIS — G4733 Obstructive sleep apnea (adult) (pediatric): Secondary | ICD-10-CM

## 2011-10-05 NOTE — Progress Notes (Signed)
10/05/11- 54 yoF smoker referred by Sandford Craze, NP for sleep problems. NPSG 11/07/04 Severe OSA, AHI 45/ hr, CPAP 10. She complains of falling asleep while driving, fighting daytime sleepiness, yawning. Separate bedroom to avoid her husband's snoring, so she gets no comment on her own. She is still wearing CPAP 10/Advanced, with a comfortable nasal mask. She has lost 10 pounds since her original study. Bedtime 11:30 PM, short sleep latency, few brief wakings during the night before up at 6:30 AM. No naps. Medical history includes hypertension, MI, DM.  Prior to Admission medications   Medication Sig Start Date End Date Taking? Authorizing Provider  ARIPiprazole (ABILIFY) 5 MG tablet Take 5 mg by mouth daily.     Yes Historical Provider, MD  aspirin 81 MG tablet Take 81 mg by mouth daily.     Yes Historical Provider, MD  B-D ULTRAFINE III SHORT PEN 31G X 8 MM MISC use once daily as directed 10/02/11  Yes Sandford Craze, NP  BENICAR HCT 20-12.5 MG per tablet take 1/2 tablet by mouth once daily 10/02/11  Yes Sandford Craze, NP  buPROPion (WELLBUTRIN XL) 150 MG 24 hr tablet Take 450 mg by mouth daily.     Yes Historical Provider, MD  Calcium Carbonate-Vitamin D (CALTRATE 600+D) 600-400 MG-UNIT per tablet Take 1 tablet by mouth 2 (two) times daily.   Yes Historical Provider, MD  glucose blood (ONE TOUCH TEST STRIPS) test strip 1 each by Other route as needed. Use as instructed    Yes Historical Provider, MD  Lancets (ONETOUCH ULTRASOFT) lancets 1 each by Other route daily. Use as instructed    Yes Historical Provider, MD  loratadine (CLARITIN) 10 MG tablet Take 10 mg by mouth daily.     Yes Historical Provider, MD  metoprolol (LOPRESSOR) 50 MG tablet take 1 tablet by mouth twice a day 09/06/11  Yes Sandford Craze, NP  multivitamin [facility]) per tablet Take 1 tablet by mouth daily.     Yes Historical Provider, MD  omeprazole (PRILOSEC) 40 MG capsule take 1 capsule by mouth once daily 30  MINUTES BEFORE MORNING MEAL 09/25/11  Yes Sandford Craze, NP  rosuvastatin (CRESTOR) 20 MG tablet Take 1 tablet (20 mg total) by mouth daily. 10/03/11  Yes Sandford Craze, NP  VICTOZA 18 MG/3ML SOLN inject 0.2 milligrams subcutaneously once daily 08/26/11  Yes Sandford Craze, NP   Past Medical History  Diagnosis Date  . CAD (coronary artery disease)   . Hyperlipidemia   . Hypertension   . Obstructive sleep apnea (adult) (pediatric)   . Esophageal reflux   . Type II or unspecified type diabetes mellitus without mention of complication, not stated as uncontrolled   . Depressive disorder, not elsewhere classified   . Allergic rhinitis, cause unspecified   . Personal history of goiter     childhood  . ASCUS on Pap smear    Past Surgical History  Procedure Date  . Coronary stent placement 2004  . Goiter removal 1968  . Tubal ligation 1985  . Colposcopy 2012    cervical biopsy (Dr. Juliene Pina)   Family History  Problem Relation Age of Onset  . Cancer Mother     throat, cervical  . Diabetes Mother   . Stroke Mother   . Heart attack Mother   . Cancer Father     prostate  . Diabetes Sister   . Heart attack Sister   . Hypertension Sister   . Colon cancer Neg Hx    History  Social History  . Marital Status: Married    Spouse Name: N/A    Number of Children: 3  . Years of Education: N/A   Occupational History  . branch Programmer, [employer]  .     Social History Main Topics  . Smoking status: Current Everyday Smoker -- 1.0 packs/day for 46 years  . Smokeless tobacco: Never Used  . Alcohol Use: Yes     occasional  . Drug Use: Not on file  . Sexually Active: Not on file   Other Topics Concern  . Not on file   Social History Narrative   Caffeine Use:  2 cups coffee dailyRegular exercise:  No   ROS-see HPI Constitutional:   No-   weight loss, night sweats, fevers, chills,  +fatigue, lassitude. HEENT:   No-  headaches, difficulty  swallowing, tooth/dental problems, sore throat,       + sneezing, itching,  No-ear ache, nasal congestion, post nasal drip,  CV:  No-   chest pain, orthopnea, PND, swelling in lower extremities, anasarca, dizziness, palpitations Resp: No-   shortness of breath with exertion or at rest.              No-   productive cough,  No non-productive cough,  No- coughing up of blood.              No-   change in color of mucus.  No- wheezing.   Skin: No-   rash or lesions. GI:  +   heartburn, indigestion,  No-abdominal pain, nausea, vomiting, diarrhea,                 change in bowel habits, loss of appetite GU: No-   dysuria, change in color of urine, no urgency or frequency.  No- flank pain. MS:  No-   joint pain or swelling.  No- decreased range of motion.  No- back pain. Neuro-     nothing unusual Psych:  No- change in mood or affect. No depression or anxiety.  No memory loss.  OBJ- Physical Exam General- Alert, Oriented, Affect-appropriate, Distress- none acute, odor of tobacco Skin- rash-none, lesions- none, excoriation- none Lymphadenopathy- none Head- atraumatic            Eyes- Gross vision intact, PERRLA, conjunctivae and secretions clear            Ears- Hearing, canals-normal            Nose- Clear, no-Septal dev, mucus, polyps, erosion, perforation             Throat- Mallampati IV , mucosa clear , drainage- none, tonsils- atrophic Neck- flexible , trachea midline, no stridor , thyroid nl, carotid no bruit Chest - symmetrical excursion , unlabored           Heart/CV- RRR , no murmur , no gallop  , no rub, nl s1 s2                           - JVD- none , edema- none, stasis changes- none, varices- none           Lung- clear to P&A, wheeze- none, + light cough , dullness-none, rub- none           Chest wall-  Abd- tender-no, distended-no, bowel sounds-present, HSM- no Br/ Gen/ Rectal- Not done, not indicated Extrem- cyanosis- none, clubbing, none, atrophy- none, strength- nl Neuro-  grossly  intact to observation

## 2011-10-05 NOTE — Patient Instructions (Signed)
Recommend- earlier regular bedtime. Try for 11 PM.  You need to find a way to routinely get more sleep.  Order- DME [facility]   autotitrate CPAP 5-15 cwp x 1 week for pressure recommendation

## 2011-10-08 NOTE — Assessment & Plan Note (Signed)
We discussed available support for smoking cessation.

## 2011-10-08 NOTE — Assessment & Plan Note (Addendum)
Plan-1) earlier bedtime by at least 30 minutes 2) auto titrate for pressure check

## 2011-10-11 ENCOUNTER — Encounter: Payer: Self-pay | Admitting: Internal Medicine

## 2011-11-14 ENCOUNTER — Ambulatory Visit: Payer: 59 | Admitting: Internal Medicine

## 2011-11-15 ENCOUNTER — Other Ambulatory Visit: Payer: Self-pay | Admitting: Internal Medicine

## 2011-11-28 ENCOUNTER — Ambulatory Visit (INDEPENDENT_AMBULATORY_CARE_PROVIDER_SITE_OTHER): Payer: 59 | Admitting: Family

## 2011-11-28 ENCOUNTER — Encounter: Payer: Self-pay | Admitting: Family

## 2011-11-28 VITALS — BP 102/76 | HR 66 | Temp 97.6°F | Resp 16 | Ht 64.5 in | Wt 169.0 lb

## 2011-11-28 DIAGNOSIS — I10 Essential (primary) hypertension: Secondary | ICD-10-CM

## 2011-11-28 DIAGNOSIS — F3289 Other specified depressive episodes: Secondary | ICD-10-CM

## 2011-11-28 DIAGNOSIS — G4733 Obstructive sleep apnea (adult) (pediatric): Secondary | ICD-10-CM

## 2011-11-28 DIAGNOSIS — E119 Type 2 diabetes mellitus without complications: Secondary | ICD-10-CM

## 2011-11-28 DIAGNOSIS — E785 Hyperlipidemia, unspecified: Secondary | ICD-10-CM

## 2011-11-28 DIAGNOSIS — F329 Major depressive disorder, single episode, unspecified: Secondary | ICD-10-CM

## 2011-11-28 LAB — HEMOGLOBIN A1C
Hgb A1c MFr Bld: 5.8 % — ABNORMAL HIGH (ref <5.7)
Mean Plasma Glucose: 120 mg/dL — ABNORMAL HIGH (ref <117)

## 2011-11-28 NOTE — Patient Instructions (Signed)
Please schedule a follow up appointment in 3 months.

## 2011-11-28 NOTE — Progress Notes (Signed)
Subjective:    Patient ID: [name] [name], female    DOB: 21-Apr-1951, 61 y.o.   MRN: [id number]  HPI  [name] [name] is a 61 yr old female who presents today for follow up.  DM2- She reports that she has been checking her sugars- she reports that her AM sugars 130's (eats a late dinner).  She remains on victoza.  Not exercising, off of metformin.  She has gained 5 pounds since last visit.   HTN-on metoprolol and benicar hct. Denies sob, chest pain, swelling.   Depression- reports that this is being treated by Dr. Jennelle Human and is currently well controlled with abilify and wellbutrin.  Reports that depression is well controlled.    Daytime somnolence- Saw Dr. Fannie Knee who recommended auto cpap titration and an earlier bedtime. She has follow up tomorrow.  Notes that she is sleeping better, not as somnolent.  Still yawning.    Hyperlipidemia- she continues crestor.  Denies myalgias.    Review of Systems see HPI.    Past Medical History  Diagnosis Date  . CAD (coronary artery disease)   . Hyperlipidemia   . Hypertension   . Obstructive sleep apnea (adult) (pediatric)   . Esophageal reflux   . Type II or unspecified type diabetes mellitus without mention of complication, not stated as uncontrolled   . Depressive disorder, not elsewhere classified   . Allergic rhinitis, cause unspecified   . Personal history of goiter     childhood  . ASCUS on Pap smear     History   Social History  . Marital Status: Married    Spouse Name: N/A    Number of Children: 3  . Years of Education: N/A   Occupational History  . branch Programmer, [employer]  .     Social History Main Topics  . Smoking status: Current Everyday Smoker -- 1.0 packs/day for 46 years  . Smokeless tobacco: Never Used  . Alcohol Use: Yes     occasional  . Drug Use: Not on file  . Sexually Active: Not on file   Other Topics Concern  . Not on file   Social History Narrative   Caffeine Use:  2  cups coffee dailyRegular exercise:  No    Past Surgical History  Procedure Date  . Coronary stent placement 2004  . Goiter removal 1968  . Tubal ligation 1985  . Colposcopy 2012    cervical biopsy (Dr. Juliene Pina)    Family History  Problem Relation Age of Onset  . Cancer Mother     throat, cervical  . Diabetes Mother   . Stroke Mother   . Heart attack Mother   . Cancer Father     prostate  . Diabetes Sister   . Heart attack Sister   . Hypertension Sister   . Colon cancer Neg Hx     No Known Allergies  Current Outpatient Prescriptions on File Prior to Visit  Medication Sig Dispense Refill  . ARIPiprazole (ABILIFY) 5 MG tablet Take 5 mg by mouth daily.        Marland Kitchen aspirin 81 MG tablet Take 81 mg by mouth daily.        . B-D ULTRAFINE III SHORT PEN 31G X 8 MM MISC use once daily as directed  100 each  2  . BENICAR HCT 20-12.5 MG per tablet take 1/2 tablet by mouth once daily  30 tablet  1  . buPROPion (WELLBUTRIN XL)  150 MG 24 hr tablet Take 450 mg by mouth daily.        . Calcium Carbonate-Vitamin D (CALTRATE 600+D) 600-400 MG-UNIT per tablet Take 1 tablet by mouth 2 (two) times daily.      . Lancets (ONETOUCH ULTRASOFT) lancets 1 each by Other route daily. Use as instructed       . loratadine (CLARITIN) 10 MG tablet Take 10 mg by mouth daily.        . metoprolol (LOPRESSOR) 50 MG tablet take 1 tablet by mouth twice a day  60 tablet  2  . multivitamin (THERAGRAN) per tablet Take 1 tablet by mouth daily.        Marland Kitchen omeprazole (PRILOSEC) 40 MG capsule take 1 capsule by mouth once daily 30 MINUTES BEFORE MORNING MEAL  30 capsule  2  . ONE TOUCH ULTRA TEST test strip TEST once daily  100 each  3  . rosuvastatin (CRESTOR) 20 MG tablet Take 1 tablet (20 mg total) by mouth daily.  30 tablet  2  . VICTOZA 18 MG/3ML SOLN inject 0.2 milligrams subcutaneously once daily  6 mL  2    BP 102/76  Pulse 66  Temp 97.6 F (36.4 C) (Oral)  Resp 16  Ht 5' 4.5" (1.638 m)  Wt 169 lb (76.658 kg)   BMI 28.56 kg/m2  SpO2 99%    Objective:   Physical Exam  Constitutional: She appears well-developed and well-nourished. No distress.  Cardiovascular: Normal rate and regular rhythm.   No murmur heard. Pulmonary/Chest: Effort normal and breath sounds normal. No respiratory distress. She has no wheezes. She has no rales. She exhibits no tenderness.  Musculoskeletal: She exhibits no edema.  Psychiatric: She has a normal mood and affect. Her behavior is normal. Judgment and thought content normal.          Assessment & Plan:

## 2011-11-28 NOTE — Assessment & Plan Note (Signed)
BP Readings from Last 3 Encounters:  11/28/11 102/76  10/05/11 130/80  08/22/11 120/70  BP reading a bit low today.  Continue current meds.  If still low next visit, consider cutting back lopressor dose.

## 2011-11-28 NOTE — Assessment & Plan Note (Signed)
Stable. Management per psychiatry.   

## 2011-11-28 NOTE — Assessment & Plan Note (Signed)
Lipids at goal on crestor. Continue same.

## 2011-11-28 NOTE — Assessment & Plan Note (Signed)
Feeling better with cpap titration.  Management per Dr. Maple Hudson.

## 2011-11-28 NOTE — Assessment & Plan Note (Signed)
Clinically stable on victoza.  Obtain A1C and urine microalbumin.

## 2011-11-29 ENCOUNTER — Other Ambulatory Visit: Payer: Self-pay | Admitting: Family

## 2011-11-29 ENCOUNTER — Encounter: Payer: Self-pay | Admitting: Internal Medicine

## 2011-11-29 ENCOUNTER — Encounter: Payer: Self-pay | Admitting: Family

## 2011-11-29 ENCOUNTER — Ambulatory Visit (INDEPENDENT_AMBULATORY_CARE_PROVIDER_SITE_OTHER): Payer: 59 | Admitting: Internal Medicine

## 2011-11-29 VITALS — BP 116/60 | HR 68 | Ht 64.0 in | Wt 171.0 lb

## 2011-11-29 DIAGNOSIS — J302 Other seasonal allergic rhinitis: Secondary | ICD-10-CM

## 2011-11-29 DIAGNOSIS — J3089 Other allergic rhinitis: Secondary | ICD-10-CM

## 2011-11-29 DIAGNOSIS — J309 Allergic rhinitis, unspecified: Secondary | ICD-10-CM

## 2011-11-29 DIAGNOSIS — F17200 Nicotine dependence, unspecified, uncomplicated: Secondary | ICD-10-CM

## 2011-11-29 DIAGNOSIS — G4733 Obstructive sleep apnea (adult) (pediatric): Secondary | ICD-10-CM

## 2011-11-29 DIAGNOSIS — R809 Proteinuria, unspecified: Secondary | ICD-10-CM | POA: Insufficient documentation

## 2011-11-29 LAB — MICROALBUMIN / CREATININE URINE RATIO
Creatinine, Urine: 216.4 mg/dL
Microalb Creat Ratio: 16.7 mg/g (ref 0.0–30.0)
Microalb, Ur: 3.61 mg/dL — ABNORMAL HIGH (ref 0.00–1.89)

## 2011-11-29 NOTE — Patient Instructions (Addendum)
You are doing very well with your CPAP. We want to keep it as comfortable as we can, so please call if there is a problem. We are leaving your pressure where it is for now.  Sample Qnasl nasal spray for allergic nose. Try using 2 puffs each nostril, once every day at bedtime.

## 2011-11-29 NOTE — Progress Notes (Signed)
10/05/11- 66 yoF smoker referred by Sandford Craze, NP for sleep problems. NPSG 11/07/04 Severe OSA, AHI 45/ hr, CPAP 10. She complains of falling asleep while driving, fighting daytime sleepiness, yawning. Separate bedroom to avoid her husband's snoring, so she gets no comment on her own. She is still wearing CPAP 10/[facility], with a comfortable nasal mask. She has lost 10 pounds since her original study. Bedtime 11:30 PM, short sleep latency, few brief wakings during the night before up at 6:30 AM. No naps. Medical history includes hypertension, MI, DM.  11/29/11- 60 yoF smoker, followed for OSA, complicated by DM, HBP, CAD allergic rhinitis, GERD  Patient states wears cpap 6-7 hours every night.  Download from [facility] is pending. She says pressure is 10. Nasal mask. Not using her humidifier. We discussed leaks.  ROS-see HPI Constitutional:   No-   weight loss, night sweats, fevers, chills, fatigue, lassitude. HEENT:   No-  headaches, difficulty swallowing, tooth/dental problems, sore throat,       + sneezing, itching,  No-ear ache, nasal congestion, post nasal drip,  CV:  No-   chest pain, orthopnea, PND, swelling in lower extremities, anasarca, dizziness, palpitations Resp: No-   shortness of breath with exertion or at rest.              No-   productive cough,  No non-productive cough,  No- coughing up of blood.              No-   change in color of mucus.  No- wheezing.   Skin: No-   rash or lesions. GI:  +   heartburn, indigestion,  No-abdominal pain, nausea, vomiting,  GU:  MS:  No-   joint pain or swelling.   Neuro-     nothing unusual Psych:  No- change in mood or affect. No depression or anxiety.  No memory loss.  OBJ- Physical Exam General- Alert, Oriented, Affect-appropriate, Distress- none acute, odor of tobacco Skin- rash-none, lesions- none, excoriation- none Lymphadenopathy- none Head- atraumatic            Eyes- Gross vision intact, PERRLA, conjunctivae and  secretions clear            Ears- Hearing, canals-normal            Nose- stuffy with turbinate edema, clear mucus, no-Septal dev,  polyps, erosion, perforation             Throat- Mallampati IV , mucosa clear , drainage- none, tonsils- atrophic Neck- flexible , trachea midline, no stridor , thyroid nl, carotid no bruit Chest - symmetrical excursion , unlabored           Heart/CV- RRR , no murmur , no gallop  , no rub, nl s1 s2                           - JVD- none , edema- none, stasis changes- none, varices- none           Lung- clear to P&A, wheeze- none, + light cough , dullness-none, rub- none           Chest wall-  Abd-  Br/ Gen/ Rectal- Not done, not indicated Extrem- cyanosis- none, clubbing, none, atrophy- none, strength- nl Neuro- grossly intact to observation

## 2011-11-30 ENCOUNTER — Encounter: Payer: Self-pay | Admitting: Family

## 2011-12-08 NOTE — Assessment & Plan Note (Signed)
Cessation counseling reinforced from previous visits.

## 2011-12-08 NOTE — Assessment & Plan Note (Signed)
Consistent with an environmental/allergic rhinitis. Plan-sample Qnasl nasal spray

## 2011-12-08 NOTE — Assessment & Plan Note (Signed)
Good compliance and control at 10 CWP/[facility] with no changes needed.

## 2011-12-18 ENCOUNTER — Other Ambulatory Visit: Payer: Self-pay | Admitting: Family

## 2011-12-29 ENCOUNTER — Other Ambulatory Visit: Payer: Self-pay | Admitting: Family

## 2012-01-12 ENCOUNTER — Other Ambulatory Visit: Payer: Self-pay | Admitting: Family

## 2012-02-27 ENCOUNTER — Ambulatory Visit: Payer: 59 | Admitting: Family

## 2012-03-05 ENCOUNTER — Ambulatory Visit (INDEPENDENT_AMBULATORY_CARE_PROVIDER_SITE_OTHER): Payer: 59 | Admitting: Family

## 2012-03-05 ENCOUNTER — Ambulatory Visit: Payer: 59 | Admitting: Family

## 2012-03-05 ENCOUNTER — Encounter: Payer: Self-pay | Admitting: Family

## 2012-03-05 ENCOUNTER — Telehealth: Payer: Self-pay | Admitting: Family

## 2012-03-05 ENCOUNTER — Other Ambulatory Visit: Payer: Self-pay | Admitting: Family

## 2012-03-05 VITALS — BP 112/70 | HR 65 | Temp 98.1°F | Resp 16 | Ht 64.5 in | Wt 170.1 lb

## 2012-03-05 DIAGNOSIS — M25512 Pain in left shoulder: Secondary | ICD-10-CM

## 2012-03-05 DIAGNOSIS — Z23 Encounter for immunization: Secondary | ICD-10-CM

## 2012-03-05 DIAGNOSIS — E119 Type 2 diabetes mellitus without complications: Secondary | ICD-10-CM

## 2012-03-05 DIAGNOSIS — E785 Hyperlipidemia, unspecified: Secondary | ICD-10-CM

## 2012-03-05 DIAGNOSIS — M25519 Pain in unspecified shoulder: Secondary | ICD-10-CM

## 2012-03-05 LAB — BASIC METABOLIC PANEL
BUN: 10 mg/dL (ref 6–23)
CO2: 25 mEq/L (ref 19–32)
Calcium: 9 mg/dL (ref 8.4–10.5)
Chloride: 109 mEq/L (ref 96–112)
Creat: 0.69 mg/dL (ref 0.50–1.10)
Glucose, Bld: 91 mg/dL (ref 70–99)
Potassium: 4 mEq/L (ref 3.5–5.3)
Sodium: 143 mEq/L (ref 135–145)

## 2012-03-05 LAB — HEPATIC FUNCTION PANEL
ALT: 15 U/L (ref 0–35)
AST: 12 U/L (ref 0–37)
Albumin: 3.8 g/dL (ref 3.5–5.2)
Alkaline Phosphatase: 63 U/L (ref 39–117)
Bilirubin, Direct: 0.1 mg/dL (ref 0.0–0.3)
Indirect Bilirubin: 0.4 mg/dL (ref 0.0–0.9)
Total Bilirubin: 0.5 mg/dL (ref 0.3–1.2)
Total Protein: 6.3 g/dL (ref 6.0–8.3)

## 2012-03-05 LAB — HEMOGLOBIN A1C
Hgb A1c MFr Bld: 5.8 % — ABNORMAL HIGH (ref <5.7)
Mean Plasma Glucose: 120 mg/dL — ABNORMAL HIGH (ref <117)

## 2012-03-05 MED ORDER — METOPROLOL TARTRATE 50 MG PO TABS: 50.0000 mg | ORAL_TABLET | Freq: Two times a day (BID) | ORAL | Status: DC

## 2012-03-05 MED ORDER — LIRAGLUTIDE 18 MG/3ML ~~LOC~~ SOLN: 1.2000 mg | Freq: Every morning | SUBCUTANEOUS | Status: DC

## 2012-03-05 MED ORDER — ROSUVASTATIN CALCIUM 20 MG PO TABS: 20.0000 mg | ORAL_TABLET | Freq: Every day | ORAL | Status: DC

## 2012-03-05 MED ORDER — PANTOPRAZOLE SODIUM 40 MG PO TBEC: 40.0000 mg | DELAYED_RELEASE_TABLET | Freq: Every day | ORAL | Status: DC

## 2012-03-05 MED ORDER — OLMESARTAN MEDOXOMIL-HCTZ 20-12.5 MG PO TABS: 0.5000 | ORAL_TABLET | Freq: Every day | ORAL | Status: DC

## 2012-03-05 NOTE — Assessment & Plan Note (Signed)
Clinically stable, last A1C was at goal.  Tolerating victoza.  Obtain A1C.

## 2012-03-05 NOTE — Telephone Encounter (Signed)
See previous authorization from office visit today.

## 2012-03-05 NOTE — Telephone Encounter (Signed)
Refill-benicar hct 20-12.5mg  tablet. Take 1/2 tablet by mouth once daily. Qty 30 last fill 8.17.13

## 2012-03-05 NOTE — Assessment & Plan Note (Signed)
We discussed possibility of PT referral vs sports med referral. She declines both at this time, wants to wait for now and will let me know if she changes her mind.

## 2012-03-05 NOTE — Patient Instructions (Addendum)
Please complete your lab work prior to leaving. Please schedule a follow up appointment in 3 months.  

## 2012-03-05 NOTE — Assessment & Plan Note (Signed)
Continue crestor, obtain flp/lft today.

## 2012-03-05 NOTE — Progress Notes (Signed)
Subjective:    Patient ID: [name] [name], female    DOB: January 25, 1951, 61 y.o.   MRN: [id number]  HPI  Ms.  [name] is a 61 yr old female who presents today for follow up.  1) DM2-  Pt continues victoza.  2) L shoulder pain- Reports that she she was having numbnes in the left shoulder.  Hurts to raise shoulder.   3) HTN-  Continues benicar hct.Denies swelling, sob, or cough.     Review of Systems See HPI  Past Medical History  Diagnosis Date  . CAD (coronary artery disease)   . Hyperlipidemia   . Hypertension   . Obstructive sleep apnea (adult) (pediatric)   . Esophageal reflux   . Type II or unspecified type diabetes mellitus without mention of complication, not stated as uncontrolled   . Depressive disorder, not elsewhere classified   . Allergic rhinitis, cause unspecified   . Personal history of goiter     childhood  . ASCUS on Pap smear     History   Social History  . Marital Status: Married    Spouse Name: N/A    Number of Children: 3  . Years of Education: N/A   Occupational History  . branch Programmer, [employer]  .     Social History Main Topics  . Smoking status: Current Every Day Smoker -- 1.0 packs/day for 46 years  . Smokeless tobacco: Never Used  . Alcohol Use: Yes     occasional  . Drug Use: Not on file  . Sexually Active: Not on file   Other Topics Concern  . Not on file   Social History Narrative   Caffeine Use:  2 cups coffee dailyRegular exercise:  No    Past Surgical History  Procedure Date  . Coronary stent placement 2004  . Goiter removal 1968  . Tubal ligation 1985  . Colposcopy 2012    cervical biopsy (Dr. Juliene Pina)    Family History  Problem Relation Age of Onset  . Cancer Mother     throat, cervical  . Diabetes Mother   . Stroke Mother   . Heart attack Mother   . Cancer Father     prostate  . Diabetes Sister   . Heart attack Sister   . Hypertension Sister   . Colon cancer Neg Hx     No Known  Allergies  Current Outpatient Prescriptions on File Prior to Visit  Medication Sig Dispense Refill  . ARIPiprazole (ABILIFY) 5 MG tablet Take 5 mg by mouth daily.        Marland Kitchen aspirin 81 MG tablet Take 81 mg by mouth daily.        . B-D ULTRAFINE III SHORT PEN 31G X 8 MM MISC use once daily as directed  100 each  2  . buPROPion (WELLBUTRIN XL) 150 MG 24 hr tablet Take 450 mg by mouth daily.        . Calcium Carbonate-Vitamin D (CALTRATE 600+D) 600-400 MG-UNIT per tablet Take 1 tablet by mouth 2 (two) times daily.      . Lancets (ONETOUCH ULTRASOFT) lancets 1 each by Other route daily. Use as instructed       . loratadine (CLARITIN) 10 MG tablet Take 10 mg by mouth as needed.       . multivitamin (THERAGRAN) per tablet Take 1 tablet by mouth daily.        . ONE TOUCH ULTRA TEST test strip TEST  once daily  100 each  3  . DISCONTD: BENICAR HCT 20-12.5 MG per tablet take 1/2 tablet by mouth once daily  30 tablet  1  . DISCONTD: CRESTOR 20 MG tablet take 1 tablet by mouth once daily  30 tablet  1  . DISCONTD: metoprolol (LOPRESSOR) 50 MG tablet take 1 tablet by mouth twice a day  60 tablet  2  . DISCONTD: VICTOZA 18 MG/3ML SOLN inject 0.2 milligrams subcutaneously once daily  6 mL  3  . pantoprazole (PROTONIX) 40 MG tablet Take 1 tablet (40 mg total) by mouth daily.  30 tablet  5    BP 112/70  Pulse 65  Temp 98.1 F (36.7 C) (Oral)  Resp 16  Ht 5' 4.5" (1.638 m)  Wt 170 lb 1.3 oz (77.148 kg)  BMI 28.74 kg/m2  SpO2 95%       Objective:   Physical Exam  Constitutional: She appears well-developed and well-nourished. No distress.  Cardiovascular: Normal rate and regular rhythm.   No murmur heard. Pulmonary/Chest: Effort normal and breath sounds normal. No respiratory distress. She has no wheezes. She has no rales. She exhibits no tenderness.  Musculoskeletal: She exhibits no edema.       Full rom left shoulder.  Mild tenderness of left shoulder near left clavicle.   Psychiatric: She has a  normal mood and affect. Her behavior is normal. Judgment and thought content normal.          Assessment & Plan:

## 2012-03-06 ENCOUNTER — Encounter: Payer: Self-pay | Admitting: Family

## 2012-03-10 ENCOUNTER — Telehealth: Payer: Self-pay | Admitting: *Deleted

## 2012-03-10 LAB — LIPID PANEL
Cholesterol: 115 mg/dL (ref 0–200)
HDL: 44 mg/dL (ref >39)
LDL Cholesterol: 52 mg/dL (ref 0–99)
Total CHOL/HDL Ratio: 2.6 Ratio
Triglycerides: 96 mg/dL (ref <150)
VLDL: 19 mg/dL (ref 0–40)

## 2012-03-10 NOTE — Telephone Encounter (Signed)
Message copied by Kathi Simpers on Mon Mar 10, 2012  9:10 AM ------      Message from: Mervin Kung A      Created: Mon Mar 10, 2012  9:06 AM       Test added.            ----- Message -----         From: Sandford Craze, NP         Sent: 03/10/2012   8:08 AM           To: Kathi Simpers, CMA            I realized I ordered FLP clinic collect.  Can we pls try to add on to blood draw? thanks

## 2012-03-11 ENCOUNTER — Encounter: Payer: Self-pay | Admitting: Family

## 2012-03-31 ENCOUNTER — Other Ambulatory Visit: Payer: Self-pay | Admitting: Family

## 2012-03-31 DIAGNOSIS — Z1231 Encounter for screening mammogram for malignant neoplasm of breast: Secondary | ICD-10-CM

## 2012-04-24 ENCOUNTER — Encounter: Payer: Self-pay | Admitting: Cardiology

## 2012-04-24 ENCOUNTER — Ambulatory Visit (INDEPENDENT_AMBULATORY_CARE_PROVIDER_SITE_OTHER): Payer: 59 | Admitting: Cardiology

## 2012-04-24 VITALS — BP 129/72 | HR 69 | Ht 64.0 in | Wt 174.5 lb

## 2012-04-24 DIAGNOSIS — I2510 Atherosclerotic heart disease of native coronary artery without angina pectoris: Secondary | ICD-10-CM

## 2012-04-24 DIAGNOSIS — I10 Essential (primary) hypertension: Secondary | ICD-10-CM

## 2012-04-24 DIAGNOSIS — F17200 Nicotine dependence, unspecified, uncomplicated: Secondary | ICD-10-CM

## 2012-04-24 DIAGNOSIS — E7800 Pure hypercholesterolemia, unspecified: Secondary | ICD-10-CM

## 2012-04-24 NOTE — Patient Instructions (Addendum)
Your physician wants you to follow-up in: ONE YEAR WITH DR CRENSHAW You will receive a reminder letter in the mail two months in advance. If you don't receive a letter, please call our office to schedule the follow-up appointment.  

## 2012-04-24 NOTE — Progress Notes (Signed)
HPI: Ms. Selley is a very pleasant female who has a history of coronary disease. She has had a prior PCI of her diagonal. Her most recent catheterization in April of 2004 showed no obstructive disease, and her ejection fraction was normal. Her most recent Myoview was performed in Dec 2012. EF 77 and normal perfusion. I last saw her in Nov 2012. Since then she does have dyspnea with more moderate exertion relieved with rest. There is no associated chest pain. There is no orthopnea, PND or pedal edema. There is no palpitations or syncope.    Current Outpatient Prescriptions  Medication Sig Dispense Refill  . ARIPiprazole (ABILIFY) 5 MG tablet Take 5 mg by mouth daily.        Marland Kitchen aspirin 81 MG tablet Take 81 mg by mouth daily.        . B-D ULTRAFINE III SHORT PEN 31G X 8 MM MISC use once daily as directed  100 each  2  . buPROPion (WELLBUTRIN XL) 150 MG 24 hr tablet Take 450 mg by mouth daily.        . Calcium Carbonate-Vitamin D (CALTRATE 600+D) 600-400 MG-UNIT per tablet Take 1 tablet by mouth 2 (two) times daily.      . Lancets (ONETOUCH ULTRASOFT) lancets 1 each by Other route daily. Use as instructed       . Liraglutide (VICTOZA) 18 MG/3ML SOLN Inject 0.2 mLs (1.2 mg total) into the skin every morning.  6 mL  5  . loratadine (CLARITIN) 10 MG tablet Take 10 mg by mouth as needed.       . metoprolol (LOPRESSOR) 50 MG tablet Take 1 tablet (50 mg total) by mouth 2 (two) times daily.  60 tablet  5  . multivitamin (THERAGRAN) per tablet Take 1 tablet by mouth daily.        Marland Kitchen olmesartan-hydrochlorothiazide (BENICAR HCT) 20-12.5 MG per tablet Take 0.5 tablets by mouth daily.  45 tablet  0  . ONE TOUCH ULTRA TEST test strip TEST once daily  100 each  3  . pantoprazole (PROTONIX) 40 MG tablet Take 1 tablet (40 mg total) by mouth daily.  30 tablet  5  . rosuvastatin (CRESTOR) 20 MG tablet Take 1 tablet (20 mg total) by mouth daily.  30 tablet  5     Past Medical History  Diagnosis Date  . CAD  (coronary artery disease)   . Hyperlipidemia   . Hypertension   . Obstructive sleep apnea (adult) (pediatric)   . Esophageal reflux   . Type II or unspecified type diabetes mellitus without mention of complication, not stated as uncontrolled   . Depressive disorder, not elsewhere classified   . Allergic rhinitis, cause unspecified   . Personal history of goiter     childhood  . ASCUS on Pap smear     Past Surgical History  Procedure Date  . Coronary stent placement 2004  . Goiter removal 1968  . Tubal ligation 1985  . Colposcopy 2012    cervical biopsy (Dr. Juliene Pina)    History   Social History  . Marital Status: Married    Spouse Name: N/A    Number of Children: 3  . Years of Education: N/A   Occupational History  . branch Programmer, [employer]  .     Social History Main Topics  . Smoking status: Current Every Day Smoker -- 1.0 packs/day for 46 years  . Smokeless tobacco: Never Used  .  Alcohol Use: Yes     Comment: occasional  . Drug Use: Not on file  . Sexually Active: Not on file   Other Topics Concern  . Not on file   Social History Narrative   Caffeine Use:  2 cups coffee dailyRegular exercise:  No    ROS: no fevers or chills, productive cough, hemoptysis, dysphasia, odynophagia, melena, hematochezia, dysuria, hematuria, rash, seizure activity, orthopnea, PND, pedal edema, claudication. Remaining systems are negative.  Physical Exam: Well-developed well-nourished in no acute distress.  Skin is warm and dry.  HEENT is normal.  Neck is supple.  Chest is clear to auscultation with normal expansion.  Cardiovascular exam is regular rate and rhythm.  Abdominal exam nontender or distended. No masses palpated. Extremities show no edema. neuro grossly intact  ECG sinus rhythm at a rate of 66. No ST changes.

## 2012-04-24 NOTE — Assessment & Plan Note (Signed)
Continue statin. Lipids and liver monitored by primary care. 

## 2012-04-24 NOTE — Assessment & Plan Note (Signed)
Continue aspirin and statin. 

## 2012-04-24 NOTE — Assessment & Plan Note (Signed)
Patient counseled on discontinuing. 

## 2012-04-24 NOTE — Assessment & Plan Note (Signed)
Blood pressure controlled. Continue present medications. 

## 2012-05-28 ENCOUNTER — Ambulatory Visit (HOSPITAL_BASED_OUTPATIENT_CLINIC_OR_DEPARTMENT_OTHER)
Admission: RE | Admit: 2012-05-28 | Discharge: 2012-05-28 | Disposition: A | Discharge status: Home / Self Care | Payer: 59 | Source: Ambulatory Visit | Attending: Family | Admitting: Family

## 2012-05-28 ENCOUNTER — Encounter: Payer: Self-pay | Admitting: Family

## 2012-05-28 ENCOUNTER — Ambulatory Visit (INDEPENDENT_AMBULATORY_CARE_PROVIDER_SITE_OTHER): Payer: 59 | Admitting: Family

## 2012-05-28 VITALS — BP 128/78 | HR 63 | Temp 97.7°F | Resp 17 | Ht 64.5 in | Wt 176.0 lb

## 2012-05-28 DIAGNOSIS — Z1231 Encounter for screening mammogram for malignant neoplasm of breast: Secondary | ICD-10-CM

## 2012-05-28 DIAGNOSIS — M25512 Pain in left shoulder: Secondary | ICD-10-CM

## 2012-05-28 DIAGNOSIS — M25519 Pain in unspecified shoulder: Secondary | ICD-10-CM

## 2012-05-28 DIAGNOSIS — L304 Erythema intertrigo: Secondary | ICD-10-CM

## 2012-05-28 DIAGNOSIS — I10 Essential (primary) hypertension: Secondary | ICD-10-CM

## 2012-05-28 DIAGNOSIS — E119 Type 2 diabetes mellitus without complications: Secondary | ICD-10-CM

## 2012-05-28 DIAGNOSIS — L538 Other specified erythematous conditions: Secondary | ICD-10-CM

## 2012-05-28 MED ORDER — NYSTATIN 100000 UNIT/GM EX POWD: Freq: Two times a day (BID) | CUTANEOUS | Status: DC

## 2012-05-28 NOTE — Progress Notes (Signed)
Subjective:    Patient ID: [name] [name], female    DOB: [birth year], 61 y.o.   MRN: [id number]  HPI  [name] [name] is a 62 yr old female who presents today for follow up.  1) HTN- Pt continues benicar-hct and metoprolol.  Denies CP/SOB or swelling  2) DM2- She has not been checking her sugars.   She continues victoza.  Denies symptomatic hypoglycemia.  3) Shoulder pain- she continues to have some left shoulder pain.    4) Itching- Reports redness/itching in the groin folds. She has been using gold bond with some improvement.    Review of Systems    see HPI  Past Medical History  Diagnosis Date  . CAD (coronary artery disease)   . Hyperlipidemia   . Hypertension   . Obstructive sleep apnea (adult) (pediatric)   . Esophageal reflux   . Type II or unspecified type diabetes mellitus without mention of complication, not stated as uncontrolled   . Depressive disorder, not elsewhere classified   . Allergic rhinitis, cause unspecified   . Personal history of goiter     childhood  . ASCUS on Pap smear     History   Social History  . Marital Status: Married    Spouse Name: N/A    Number of Children: 3  . Years of Education: N/A   Occupational History  . branch Programmer, [employer]  .     Social History Main Topics  . Smoking status: Current Every Day Smoker -- 46 years  . Smokeless tobacco: Never Used     Comment: Uses 1 electronic cigarette daily  . Alcohol Use: Yes     Comment: occasional  . Drug Use: Not on file  . Sexually Active: Not on file   Other Topics Concern  . Not on file   Social History Narrative   Caffeine Use:  2 cups coffee dailyRegular exercise:  No    Past Surgical History  Procedure Date  . Coronary stent placement 2004  . Goiter removal 1968  . Tubal ligation 1985  . Colposcopy 2012    cervical biopsy (Dr. Juliene Pina)    Family History  Problem Relation Age of Onset  . Cancer Mother     throat, cervical  . Diabetes  Mother   . Stroke Mother   . Heart attack Mother   . Cancer Father     prostate  . Diabetes Sister   . Heart attack Sister   . Hypertension Sister   . Colon cancer Neg Hx     No Known Allergies  Current Outpatient Prescriptions on File Prior to Visit  Medication Sig Dispense Refill  . ARIPiprazole (ABILIFY) 5 MG tablet Take 5 mg by mouth daily.        Marland Kitchen aspirin 81 MG tablet Take 81 mg by mouth daily.        . B-D ULTRAFINE III SHORT PEN 31G X 8 MM MISC use once daily as directed  100 each  2  . buPROPion (WELLBUTRIN XL) 150 MG 24 hr tablet Take 450 mg by mouth daily.        . Calcium Carbonate-Vitamin D (CALTRATE 600+D) 600-400 MG-UNIT per tablet Take 1 tablet by mouth 2 (two) times daily.      . Lancets (ONETOUCH ULTRASOFT) lancets 1 each by Other route daily. Use as instructed       . Liraglutide (VICTOZA) 18 MG/3ML SOLN Inject 0.2 mLs (1.2 mg total)  into the skin every morning.  6 mL  5  . loratadine (CLARITIN) 10 MG tablet Take 10 mg by mouth as needed.       . metoprolol (LOPRESSOR) 50 MG tablet Take 1 tablet (50 mg total) by mouth 2 (two) times daily.  60 tablet  5  . multivitamin (THERAGRAN) per tablet Take 1 tablet by mouth daily.        Marland Kitchen olmesartan-hydrochlorothiazide (BENICAR HCT) 20-12.5 MG per tablet Take 0.5 tablets by mouth daily.  45 tablet  0  . ONE TOUCH ULTRA TEST test strip TEST once daily  100 each  3  . pantoprazole (PROTONIX) 40 MG tablet Take 1 tablet (40 mg total) by mouth daily.  30 tablet  5  . rosuvastatin (CRESTOR) 20 MG tablet Take 1 tablet (20 mg total) by mouth daily.  30 tablet  5    BP 128/78  Pulse 63  Temp 97.7 F (36.5 C) (Oral)  Resp 17  Ht 5' 4.5" (1.638 m)  Wt 176 lb 0.6 oz (79.851 kg)  BMI 29.75 kg/m2  SpO2 96%    Objective:   Physical Exam  Constitutional: She appears well-developed and well-nourished. No distress.  HENT:  Head: Normocephalic and atraumatic.  Cardiovascular: Normal rate and regular rhythm.   No murmur  heard. Pulmonary/Chest: Effort normal and breath sounds normal. No respiratory distress. She has no wheezes. She has no rales. She exhibits no tenderness.  Musculoskeletal:       Left shoulder is non-tender and non-swollen.  Difficulty extending left arm/shoulder  Psychiatric: She has a normal mood and affect. Her behavior is normal. Judgment and thought content normal.          Assessment & Plan:

## 2012-05-28 NOTE — Assessment & Plan Note (Signed)
BP stable on metoprolol and benicar hct.  Continue same.

## 2012-05-28 NOTE — Assessment & Plan Note (Addendum)
Pt is not checking sugars.   Check A1C.

## 2012-05-28 NOTE — Assessment & Plan Note (Signed)
Unchanged, refer to sports medicine.

## 2012-05-28 NOTE — Assessment & Plan Note (Signed)
Rx with nystatin.

## 2012-05-28 NOTE — Patient Instructions (Addendum)
You will be contacted about your referral to sports medicine.   Please let us know if you have not heard back within 1 week about your referral. Please complete your lab work at the [facility] lab after 12/25. Schedule a follow up visit with [place] at least 90 days after your blood draw. Happy Holidays!

## 2012-05-29 ENCOUNTER — Ambulatory Visit: Payer: 59 | Admitting: Internal Medicine

## 2012-06-05 ENCOUNTER — Ambulatory Visit (HOSPITAL_BASED_OUTPATIENT_CLINIC_OR_DEPARTMENT_OTHER)
Admission: RE | Admit: 2012-06-05 | Discharge: 2012-06-05 | Disposition: A | Discharge status: Home / Self Care | Payer: 59 | Source: Ambulatory Visit | Attending: Family | Admitting: Family

## 2012-06-05 DIAGNOSIS — Z1231 Encounter for screening mammogram for malignant neoplasm of breast: Secondary | ICD-10-CM | POA: Insufficient documentation

## 2012-06-09 ENCOUNTER — Encounter: Payer: Self-pay | Admitting: Family Medicine

## 2012-06-09 ENCOUNTER — Ambulatory Visit (INDEPENDENT_AMBULATORY_CARE_PROVIDER_SITE_OTHER): Payer: 59 | Admitting: Family Medicine

## 2012-06-09 VITALS — BP 144/90 | HR 67 | Ht 64.0 in | Wt 172.0 lb

## 2012-06-09 DIAGNOSIS — M25519 Pain in unspecified shoulder: Secondary | ICD-10-CM

## 2012-06-09 DIAGNOSIS — M25512 Pain in left shoulder: Secondary | ICD-10-CM

## 2012-06-09 MED ORDER — MELOXICAM 15 MG PO TABS: 15.0000 mg | ORAL_TABLET | Freq: Every day | ORAL | Status: DC

## 2012-06-09 NOTE — Patient Instructions (Addendum)
You have rotator cuff impingement Try to avoid painful activities (overhead activities, lifting with extended arm) as much as possible. Meloxicam 15mg  daily with food for pain and inflammation. Subacromial injection may be beneficial to help with pain and to decrease inflammation if you're not improving. Start physical therapy with transition to home exercise program. Do home exercise program with theraband and scapular stabilization exercises daily - these are very important for long term relief even if an injection was given. If not improving at follow-up we will consider injection, nitro patches, or imaging.

## 2012-06-12 ENCOUNTER — Encounter: Payer: Self-pay | Admitting: Family Medicine

## 2012-06-12 NOTE — Assessment & Plan Note (Signed)
2/2 rotator cuff impingement.  Start with physical therapy, home exercises, mobic.  Declined cortisone injection today.  Consider injection, nitro patches, further imaging if not improving as expected.  F/u in 4-6 weeks.

## 2012-06-12 NOTE — Progress Notes (Signed)
Subjective:    Patient ID: [name] [name], female    DOB: 02/01/1951, 62 y.o.   MRN: [id number]  PCP: Sandford Craze  HPI 62 yo F here for left shoulder pain.  Patient denies known injury. States over past 2-3 months has had worsening left lateral shoulder pain. Worse with overhead motions. Some night pain. No bruising or swelling. No prior issues. Decreased motion. Not taking any medicines or icing. No numbness/tingling.  Past Medical History  Diagnosis Date  . CAD (coronary artery disease)   . Hyperlipidemia   . Hypertension   . Obstructive sleep apnea (adult) (pediatric)   . Esophageal reflux   . Type II or unspecified type diabetes mellitus without mention of complication, not stated as uncontrolled   . Depressive disorder, not elsewhere classified   . Allergic rhinitis, cause unspecified   . Personal history of goiter     childhood  . ASCUS on Pap smear     Current Outpatient Prescriptions on File Prior to Visit  Medication Sig Dispense Refill  . ARIPiprazole (ABILIFY) 5 MG tablet Take 5 mg by mouth daily.        Marland Kitchen aspirin 81 MG tablet Take 81 mg by mouth daily.        . B-D ULTRAFINE III SHORT PEN 31G X 8 MM MISC use once daily as directed  100 each  2  . buPROPion (WELLBUTRIN XL) 150 MG 24 hr tablet Take 450 mg by mouth daily.        . Calcium Carbonate-Vitamin D (CALTRATE 600+D) 600-400 MG-UNIT per tablet Take 1 tablet by mouth 2 (two) times daily.      . Lancets (ONETOUCH ULTRASOFT) lancets 1 each by Other route daily. Use as instructed       . Liraglutide (VICTOZA) 18 MG/3ML SOLN Inject 0.2 mLs (1.2 mg total) into the skin every morning.  6 mL  5  . loratadine (CLARITIN) 10 MG tablet Take 10 mg by mouth as needed.       . metoprolol (LOPRESSOR) 50 MG tablet Take 1 tablet (50 mg total) by mouth 2 (two) times daily.  60 tablet  5  . multivitamin (THERAGRAN) per tablet Take 1 tablet by mouth daily.        Marland Kitchen nystatin (MYCOSTATIN) powder Apply topically 2 (two)  times daily.  30 g  2  . olmesartan-hydrochlorothiazide (BENICAR HCT) 20-12.5 MG per tablet Take 0.5 tablets by mouth daily.  45 tablet  0  . ONE TOUCH ULTRA TEST test strip TEST once daily  100 each  3  . pantoprazole (PROTONIX) 40 MG tablet Take 1 tablet (40 mg total) by mouth daily.  30 tablet  5  . rosuvastatin (CRESTOR) 20 MG tablet Take 1 tablet (20 mg total) by mouth daily.  30 tablet  5    Past Surgical History  Procedure Date  . Coronary stent placement 2004  . Goiter removal 1968  . Tubal ligation 1985  . Colposcopy 2012    cervical biopsy (Dr. Juliene Pina)    No Known Allergies  History   Social History  . Marital Status: Married    Spouse Name: N/A    Number of Children: 3  . Years of Education: N/A   Occupational History  . branch Programmer, [employer]  .     Social History Main Topics  . Smoking status: Current Every Day Smoker -- 46 years  . Smokeless tobacco: Never Used  Comment: Uses 1 electronic cigarette daily  . Alcohol Use: Yes     Comment: occasional  . Drug Use: Not on file  . Sexually Active: Not on file   Other Topics Concern  . Not on file   Social History Narrative   Caffeine Use:  2 cups coffee dailyRegular exercise:  No    Family History  Problem Relation Age of Onset  . Cancer Mother     throat, cervical  . Diabetes Mother   . Stroke Mother   . Heart attack Mother   . Cancer Father     prostate  . Diabetes Sister   . Heart attack Sister   . Hypertension Sister   . Hyperlipidemia Sister   . Colon cancer Neg Hx     BP 144/90  Pulse 67  Ht 5\' 4"  (1.626 m)  Wt 172 lb (78.019 kg)  BMI 29.52 kg/m2  Review of Systems See HPI above.    Objective:   Physical Exam Gen: NAD  L shoulder: No swelling, ecchymoses.  No gross deformity. No TTP. FROM with mild painful arc. Positive Hawkins, Neers. Negative Speeds, Yergasons. Strength 5/5 with empty can and resisted internal/external rotation.  Pain empty  can. Negative apprehension. NV intact distally.    Assessment & Plan:  1. Left shoulder pain - 2/2 rotator cuff impingement.  Start with physical therapy, home exercises, mobic.  Declined cortisone injection today.  Consider injection, nitro patches, further imaging if not improving as expected.  F/u in 4-6 weeks.

## 2012-06-19 ENCOUNTER — Ambulatory Visit: Payer: 59 | Attending: Family Medicine

## 2012-06-19 DIAGNOSIS — R5381 Other malaise: Secondary | ICD-10-CM | POA: Insufficient documentation

## 2012-06-19 DIAGNOSIS — M25519 Pain in unspecified shoulder: Secondary | ICD-10-CM | POA: Insufficient documentation

## 2012-06-19 DIAGNOSIS — IMO0001 Reserved for inherently not codable concepts without codable children: Secondary | ICD-10-CM | POA: Insufficient documentation

## 2012-06-24 ENCOUNTER — Ambulatory Visit: Payer: 59 | Admitting: Physical Therapy

## 2012-06-25 ENCOUNTER — Encounter: Payer: Self-pay | Admitting: Family

## 2012-06-25 ENCOUNTER — Other Ambulatory Visit (INDEPENDENT_AMBULATORY_CARE_PROVIDER_SITE_OTHER): Payer: 59

## 2012-06-25 DIAGNOSIS — M25512 Pain in left shoulder: Secondary | ICD-10-CM

## 2012-06-25 DIAGNOSIS — M25519 Pain in unspecified shoulder: Secondary | ICD-10-CM

## 2012-06-25 DIAGNOSIS — E119 Type 2 diabetes mellitus without complications: Secondary | ICD-10-CM

## 2012-06-25 LAB — BASIC METABOLIC PANEL
BUN: 18 mg/dL (ref 6–23)
CO2: 29 mEq/L (ref 19–32)
Calcium: 9.1 mg/dL (ref 8.4–10.5)
Chloride: 109 mEq/L (ref 96–112)
Creatinine, Ser: 0.9 mg/dL (ref 0.4–1.2)
GFR: 70.03 mL/min (ref >60.00)
Glucose, Bld: 129 mg/dL — ABNORMAL HIGH (ref 70–99)
Potassium: 5 mEq/L (ref 3.5–5.1)
Sodium: 142 mEq/L (ref 135–145)

## 2012-06-25 LAB — HEMOGLOBIN A1C: Hgb A1c MFr Bld: 6 % (ref 4.6–6.5)

## 2012-06-26 ENCOUNTER — Ambulatory Visit: Payer: 59

## 2012-07-01 ENCOUNTER — Ambulatory Visit: Payer: 59

## 2012-07-03 ENCOUNTER — Ambulatory Visit: Payer: 59

## 2012-07-05 ENCOUNTER — Other Ambulatory Visit: Payer: Self-pay | Admitting: Family

## 2012-07-10 ENCOUNTER — Ambulatory Visit: Payer: 59

## 2012-07-15 ENCOUNTER — Encounter: Payer: Self-pay | Admitting: Family Medicine

## 2012-07-15 ENCOUNTER — Ambulatory Visit (INDEPENDENT_AMBULATORY_CARE_PROVIDER_SITE_OTHER): Payer: 59 | Admitting: Family Medicine

## 2012-07-15 VITALS — BP 129/91 | HR 66 | Ht 64.0 in | Wt 173.0 lb

## 2012-07-15 DIAGNOSIS — M25519 Pain in unspecified shoulder: Secondary | ICD-10-CM

## 2012-07-15 DIAGNOSIS — M25512 Pain in left shoulder: Secondary | ICD-10-CM

## 2012-07-16 ENCOUNTER — Encounter: Payer: Self-pay | Admitting: Family Medicine

## 2012-07-16 NOTE — Progress Notes (Signed)
Subjective:    Patient ID: [name] [name], female    DOB: 1951-04-21, 62 y.o.   MRN: [id number]  PCP: Sandford Craze  HPI  62 yo F here for f/u left shoulder pain.  12/30: Patient denies known injury. States over past 2-3 months has had worsening left lateral shoulder pain. Worse with overhead motions. Some night pain. No bruising or swelling. No prior issues. Decreased motion. Not taking any medicines or icing. No numbness/tingling.  2/4: Patient reports nearly completely improved from last visit. No current pain except when extending left shoulder behind her. Not taking anything for pain. Finished with physical therapy and now doing home exercises. No numbness/tingling. No neck pain. No other complaints.  Past Medical History  Diagnosis Date  . CAD (coronary artery disease)   . Hyperlipidemia   . Hypertension   . Obstructive sleep apnea (adult) (pediatric)   . Esophageal reflux   . Type II or unspecified type diabetes mellitus without mention of complication, not stated as uncontrolled   . Depressive disorder, not elsewhere classified   . Allergic rhinitis, cause unspecified   . Personal history of goiter     childhood  . ASCUS on Pap smear     Current Outpatient Prescriptions on File Prior to Visit  Medication Sig Dispense Refill  . ARIPiprazole (ABILIFY) 5 MG tablet Take 5 mg by mouth daily.        Marland Kitchen aspirin 81 MG tablet Take 81 mg by mouth daily.        . B-D ULTRAFINE III SHORT PEN 31G X 8 MM MISC use once daily as directed  100 each  2  . BENICAR HCT 20-12.5 MG per tablet take 1/2 tablet by mouth once daily  45 each  0  . buPROPion (WELLBUTRIN XL) 150 MG 24 hr tablet Take 450 mg by mouth daily.        . Calcium Carbonate-Vitamin D (CALTRATE 600+D) 600-400 MG-UNIT per tablet Take 1 tablet by mouth 2 (two) times daily.      . Lancets (ONETOUCH ULTRASOFT) lancets 1 each by Other route daily. Use as instructed       . Liraglutide (VICTOZA) 18 MG/3ML SOLN  Inject 0.2 mLs (1.2 mg total) into the skin every morning.  6 mL  5  . loratadine (CLARITIN) 10 MG tablet Take 10 mg by mouth as needed.       . meloxicam (MOBIC) 15 MG tablet Take 1 tablet (15 mg total) by mouth daily. With food.  30 tablet  1  . metoprolol (LOPRESSOR) 50 MG tablet Take 1 tablet (50 mg total) by mouth 2 (two) times daily.  60 tablet  5  . multivitamin (THERAGRAN) per tablet Take 1 tablet by mouth daily.        Marland Kitchen nystatin (MYCOSTATIN) powder Apply topically 2 (two) times daily.  30 g  2  . ONE TOUCH ULTRA TEST test strip TEST once daily  100 each  3  . pantoprazole (PROTONIX) 40 MG tablet Take 1 tablet (40 mg total) by mouth daily.  30 tablet  5  . rosuvastatin (CRESTOR) 20 MG tablet Take 1 tablet (20 mg total) by mouth daily.  30 tablet  5    Past Surgical History  Procedure Date  . Coronary stent placement 2004  . Goiter removal 1968  . Tubal ligation 1985  . Colposcopy 2012    cervical biopsy (Dr. Juliene Pina)    No Known Allergies  History   Social History  .  Marital Status: Married    Spouse Name: N/A    Number of Children: 3  . Years of Education: N/A   Occupational History  . branch Programmer, [employer]  .     Social History Main Topics  . Smoking status: Current Every Day Smoker -- 46 years  . Smokeless tobacco: Never Used     Comment: Uses 1 electronic cigarette daily  . Alcohol Use: Yes     Comment: occasional  . Drug Use: Not on file  . Sexually Active: Not on file   Other Topics Concern  . Not on file   Social History Narrative   Caffeine Use:  2 cups coffee dailyRegular exercise:  No    Family History  Problem Relation Age of Onset  . Cancer Mother     throat, cervical  . Diabetes Mother   . Stroke Mother   . Heart attack Mother   . Cancer Father     prostate  . Diabetes Sister   . Heart attack Sister   . Hypertension Sister   . Hyperlipidemia Sister   . Colon cancer Neg Hx     BP 129/91  Pulse 66  Ht  5\' 4"  (1.626 m)  Wt 173 lb (78.472 kg)  BMI 29.70 kg/m2  Review of Systems  See HPI above.    Objective:   Physical Exam  Gen: NAD  L shoulder: No swelling, ecchymoses.  No gross deformity. No TTP. FROM with negative painful arc. Negative Hawkins, Neers. Negative Speeds, Yergasons. Strength 5/5 with empty can and resisted internal/external rotation.  No pain with empty can. Negative apprehension. NV intact distally.    Assessment & Plan:  1. Left shoulder pain - 2/2 rotator cuff impingement.  Significant improvement.  Continue with home exercises for another 6 weeks.  Mobic as needed.  F/u prn.

## 2012-07-16 NOTE — Assessment & Plan Note (Signed)
2/2 rotator cuff impingement.  Significant improvement.  Continue with home exercises for another 6 weeks.  Mobic as needed.  F/u prn.

## 2012-08-25 ENCOUNTER — Telehealth: Payer: Self-pay | Admitting: Family

## 2012-08-25 MED ORDER — INSULIN PEN NEEDLE 31G X 8 MM MISC: Status: DC

## 2012-08-25 NOTE — Telephone Encounter (Signed)
Refill sent.

## 2012-08-25 NOTE — Telephone Encounter (Signed)
Bd uf short pen needle 31gx65mm  Qty 100

## 2012-09-17 ENCOUNTER — Ambulatory Visit: Payer: 59 | Admitting: Family

## 2012-09-22 ENCOUNTER — Encounter: Payer: Self-pay | Admitting: Family

## 2012-09-22 ENCOUNTER — Ambulatory Visit (INDEPENDENT_AMBULATORY_CARE_PROVIDER_SITE_OTHER): Payer: 59 | Admitting: Family

## 2012-09-22 VITALS — BP 100/72 | HR 70 | Temp 97.6°F | Resp 18 | Ht 64.5 in | Wt 182.0 lb

## 2012-09-22 DIAGNOSIS — E119 Type 2 diabetes mellitus without complications: Secondary | ICD-10-CM

## 2012-09-22 DIAGNOSIS — J309 Allergic rhinitis, unspecified: Secondary | ICD-10-CM

## 2012-09-22 DIAGNOSIS — R635 Abnormal weight gain: Secondary | ICD-10-CM

## 2012-09-22 DIAGNOSIS — E785 Hyperlipidemia, unspecified: Secondary | ICD-10-CM

## 2012-09-22 DIAGNOSIS — I10 Essential (primary) hypertension: Secondary | ICD-10-CM

## 2012-09-22 LAB — HEPATIC FUNCTION PANEL
ALT: 17 U/L (ref 0–35)
AST: 13 U/L (ref 0–37)
Albumin: 4.2 g/dL (ref 3.5–5.2)
Alkaline Phosphatase: 64 U/L (ref 39–117)
Bilirubin, Direct: 0.1 mg/dL (ref 0.0–0.3)
Indirect Bilirubin: 0.4 mg/dL (ref 0.0–0.9)
Total Bilirubin: 0.5 mg/dL (ref 0.3–1.2)
Total Protein: 6.8 g/dL (ref 6.0–8.3)

## 2012-09-22 LAB — BASIC METABOLIC PANEL
BUN: 13 mg/dL (ref 6–23)
CO2: 31 mEq/L (ref 19–32)
Calcium: 9.5 mg/dL (ref 8.4–10.5)
Chloride: 99 mEq/L (ref 96–112)
Creat: 1 mg/dL (ref 0.50–1.10)
Glucose, Bld: 141 mg/dL — ABNORMAL HIGH (ref 70–99)
Potassium: 3.8 mEq/L (ref 3.5–5.3)
Sodium: 135 mEq/L (ref 135–145)

## 2012-09-22 LAB — TSH: TSH: 0.672 u[IU]/mL (ref 0.350–4.500)

## 2012-09-22 NOTE — Progress Notes (Signed)
Subjective:    Patient ID: [name] [name], female    DOB: January 28, 1951, 62 y.o.   MRN: [id number]  HPI  Ms.  [name] is a 62 yr old female who presents today for follow up.  1) Weight gain- has gained 9 pounds in the last 2 months.Not exercising. Eating the wrong things.  2) DM2- Last A1C was 6.0.  She is maintained on Victoza. Reports that her fasting sugars are in the low 130's.   3) Cough- She continues to smoke e. Cigs.  Uses sporadically.  She reports cough and wheezing in her chest.  Reports that she has not been taking any allergy meds. She denies fever.  Notes that cough started 1 week ago in [facility].  Used benadryl   4) HTN- Currently maintained on Benicar HCT and metoprolol. She denies dizziness or chest pain.   Review of Systems    see HPI  Past Medical History  Diagnosis Date  . CAD (coronary artery disease)   . Hyperlipidemia   . Hypertension   . Obstructive sleep apnea (adult) (pediatric)   . Esophageal reflux   . Type II or unspecified type diabetes mellitus without mention of complication, not stated as uncontrolled   . Depressive disorder, not elsewhere classified   . Allergic rhinitis, cause unspecified   . Personal history of goiter     childhood  . ASCUS on Pap smear     History   Social History  . Marital Status: Married    Spouse Name: N/A    Number of Children: 3  . Years of Education: N/A   Occupational History  . branch Programmer, [employer]  .     Social History Main Topics  . Smoking status: Current Every Day Smoker -- 46 years  . Smokeless tobacco: Never Used     Comment: Uses 1 electronic cigarette daily  . Alcohol Use: Yes     Comment: occasional  . Drug Use: Not on file  . Sexually Active: Not on file   Other Topics Concern  . Not on file   Social History Narrative   Caffeine Use:  2 cups coffee daily   Regular exercise:  No          Past Surgical History  Procedure Laterality Date  . Coronary stent  placement  2004  . Goiter removal  1968  . Tubal ligation  1985  . Colposcopy  2012    cervical biopsy (Dr. Juliene Pina)    Family History  Problem Relation Age of Onset  . Cancer Mother     throat, cervical  . Diabetes Mother   . Stroke Mother   . Heart attack Mother   . Cancer Father     prostate  . Diabetes Sister   . Heart attack Sister   . Hypertension Sister   . Hyperlipidemia Sister   . Colon cancer Neg Hx     No Known Allergies  Current Outpatient Prescriptions on File Prior to Visit  Medication Sig Dispense Refill  . ARIPiprazole (ABILIFY) 5 MG tablet Take 5 mg by mouth daily.        Marland Kitchen aspirin 81 MG tablet Take 81 mg by mouth daily.        Marland Kitchen BENICAR HCT 20-12.5 MG per tablet take 1/2 tablet by mouth once daily  45 each  0  . buPROPion (WELLBUTRIN XL) 150 MG 24 hr tablet Take 450 mg by mouth daily.        Marland Kitchen  Calcium Carbonate-Vitamin D (CALTRATE 600+D) 600-400 MG-UNIT per tablet Take 1 tablet by mouth 2 (two) times daily.      . Insulin Pen Needle (B-D ULTRAFINE III SHORT PEN) 31G X 8 MM MISC use once daily as directed  100 each  2  . Lancets (ONETOUCH ULTRASOFT) lancets 1 each by Other route daily. Use as instructed       . Liraglutide (VICTOZA) 18 MG/3ML SOLN Inject 0.2 mLs (1.2 mg total) into the skin every morning.  6 mL  5  . loratadine (CLARITIN) 10 MG tablet Take 10 mg by mouth as needed.       . meloxicam (MOBIC) 15 MG tablet Take 1 tablet (15 mg total) by mouth daily. With food.  30 tablet  1  . metoprolol (LOPRESSOR) 50 MG tablet Take 1 tablet (50 mg total) by mouth 2 (two) times daily.  60 tablet  5  . multivitamin (THERAGRAN) per tablet Take 1 tablet by mouth daily.        Marland Kitchen nystatin (MYCOSTATIN) powder Apply topically 2 (two) times daily.  30 g  2  . ONE TOUCH ULTRA TEST test strip TEST once daily  100 each  3  . pantoprazole (PROTONIX) 40 MG tablet Take 1 tablet (40 mg total) by mouth daily.  30 tablet  5  . rosuvastatin (CRESTOR) 20 MG tablet Take 1 tablet (20  mg total) by mouth daily.  30 tablet  5   No current facility-administered medications on file prior to visit.    BP 100/72  Pulse 70  Temp(Src) 97.6 F (36.4 C) (Oral)  Resp 18  Ht 5' 4.5" (1.638 m)  Wt 182 lb (82.555 kg)  BMI 30.77 kg/m2  SpO2 96%    Objective:   Physical Exam  Constitutional: She is oriented to person, place, and time. She appears well-developed and well-nourished. No distress.  Eyes: No scleral icterus.  Cardiovascular: Normal rate and regular rhythm.   No murmur heard. Pulmonary/Chest: Effort normal and breath sounds normal. No respiratory distress. She has no wheezes. She has no rales. She exhibits no tenderness.  Musculoskeletal: She exhibits no edema.  Lymphadenopathy:    She has no cervical adenopathy.  Neurological: She is alert and oriented to person, place, and time.  Psychiatric: She has a normal mood and affect. Her behavior is normal. Thought content normal.          Assessment & Plan:

## 2012-09-22 NOTE — Patient Instructions (Addendum)
Please add Zyrtec 10 mg once daily.  Please call if symptoms worsen (cough/nasal congestion) or if not improved in 2-3 days.  Complete lab work prior to leaving.  Please schedule a follow up appointment in 3 months.

## 2012-09-25 ENCOUNTER — Telehealth: Payer: Self-pay | Admitting: Family

## 2012-09-25 NOTE — Telephone Encounter (Signed)
Patient Information:  Caller Name: Eunice Blase  Phone: [phone]  Patient: [name] [name], [name] [name]  Gender: Female  DOB: 1950/06/26  Age: 62 Years  PCP: Sandford Craze (Adults only)  Office Follow Up:  Does the office need to follow up with this patient?: Yes  Instructions For The Office: Please follow up with patient regarding increased sinus symptoms.  Provider told patient to call back if she was not better in 3 days.  RN Note:  Was seen in the office on 09/22/12.  She was told to try Zyrtec and to call back if symptoms worsen or did not resolve in 3 days.  She call back today having right facial pain (under eye, cheekbone) hurts to touch with increased pressure when she bends over.  Is not able blow anything out.  Has a slight cough.  Appetite has not changed.  She feels that the use of Zyrtec has made symptoms worse.  Triaged with care advice given.  Per note in Epic patient calls back as directed with symptoms no better.  Drug store of choice is [facility] on file on [address]  Symptoms  Reason For Call & Symptoms: Seen in office on 04/14 now has sinus issues  Reviewed Health History In EMR: Yes  Reviewed Medications In EMR: Yes  Reviewed Allergies In EMR: Yes  Reviewed Surgeries / Procedures: Yes  Date of Onset of Symptoms: 09/18/2012  Treatments Tried: allergy medication  Treatments Tried Worked: No  Guideline(s) Used:  Sinus Pain and Congestion  Disposition Per Guideline:   See Today or Tomorrow in Office  Reason For Disposition Reached:   Sinus congestion (pressure, fullness) present > 10 days  Advice Given:  For a Runny Nose With Profuse Discharge:  Nasal mucus and discharge helps to wash viruses and bacteria out of the nose and sinuses.  For a Stuffy Nose - Use Nasal Washes:  Introduction: Saline (salt water) nasal irrigation (nasal wash) is an effective and simple home remedy for treating stuffy nose and sinus congestion. The nose can be irrigated by pouring,  spraying, or squirting salt water into the nose and then letting it run back out.  Call Back If:   Sinus pain lasts longer than 1 day after starting treatment using nasal washes  Sinus congestion (fullness) lasts longer than 10 days  Fever lasts longer than 3 days  You become worse.  RN Overrode Recommendation:  Patient Requests Prescription  Was told per provider to call back if not better in 3 days.

## 2012-09-25 NOTE — Telephone Encounter (Signed)
Spoke w/patient & informed provider out of office today; scheduled pt tomorrow 04.18.14 at 11:30a w/Dr. Alwyn Ren at [facility]

## 2012-09-26 ENCOUNTER — Ambulatory Visit (INDEPENDENT_AMBULATORY_CARE_PROVIDER_SITE_OTHER): Payer: 59 | Admitting: Internal Medicine

## 2012-09-26 VITALS — BP 122/80 | HR 66 | Temp 98.2°F | Wt 180.0 lb

## 2012-09-26 DIAGNOSIS — J0110 Acute frontal sinusitis, unspecified: Secondary | ICD-10-CM

## 2012-09-26 DIAGNOSIS — J0100 Acute maxillary sinusitis, unspecified: Secondary | ICD-10-CM

## 2012-09-26 MED ORDER — AMOXICILLIN 500 MG PO CAPS: 500.0000 mg | ORAL_CAPSULE | Freq: Three times a day (TID) | ORAL | Status: DC

## 2012-09-26 MED ORDER — FLUTICASONE PROPIONATE 50 MCG/ACT NA SUSP: 1.0000 | Freq: Two times a day (BID) | NASAL | Status: DC | PRN

## 2012-09-26 NOTE — Patient Instructions (Addendum)

## 2012-09-26 NOTE — Progress Notes (Signed)
  Subjective:    Patient ID: [name] [name], female    DOB: [birth year], 62 y.o.   MRN: [id number]  HPI  Symptoms began 7-10 days ago as head congestion and nonproductive cough. As of 4/16 she developed right frontal and right facial pain associated with dental pain on the right as well. Her throat has been raw.She's had pressure in ears but no pain. She's had no otic discharge  She has not treated the symptoms with any prescription or over-the-counter medications.She is using E cigarettes.      Review of Systems  She denies extrinsic symptoms such as itchy, watery eyes, sneezing. She's had no associated fever, chills, or sweats.  There's been no purulence from the nose or chest.   The cough is not associated with shortness of breath or wheezing. She denies pleuritic chest pain  Her blood sugars have been in the 130s.     Objective:   Physical Exam General appearance:well nourished; no acute distress or increased work of breathing is present.  No  lymphadenopathy about the head, neck, or axilla noted.   Eyes: No conjunctival inflammation or lid edema is present.   Ears:  External ear exam shows no significant lesions or deformities.  Some wax in the left canal. Nose:  External nasal examination shows no deformity or inflammation. Nasal mucosa are dry without lesions or exudates. No septal dislocation or deviation.No obstruction to airflow.   Oral exam: Denture upper & lower partial; lips and gums are healthy appearing.There is no oropharyngeal erythema or exudate noted.   Neck:  No deformities,  masses, or tenderness noted.      Heart:  Normal rate and regular rhythm. S1 and S2 normal without gallop,  click, rub or other extra sounds. Grade 1/6 systolic murmur R base  Lungs: Very subtle low-grade rhonchi right lower lobe. No EA changes with whispered pectoriloquy testing.No increased work of breathing.    Extremities:  No cyanosis, edema, or clubbing  noted    Skin: Warm & dry          Assessment & Plan:  #1 right frontal and maxillary sinusitis  #2 cough without definite bronchitis.  #3 diabetes; no evidence of uncontrolled diabetes based on fasting blood sugars  Plan: See orders and recommendations

## 2012-09-28 ENCOUNTER — Telehealth: Payer: Self-pay | Admitting: Family

## 2012-09-28 DIAGNOSIS — J309 Allergic rhinitis, unspecified: Secondary | ICD-10-CM | POA: Insufficient documentation

## 2012-09-28 DIAGNOSIS — R635 Abnormal weight gain: Secondary | ICD-10-CM | POA: Insufficient documentation

## 2012-09-28 NOTE — Assessment & Plan Note (Signed)
Add Zyrtec and flonase.  I suspect cough is related to allergic rhinitis. We did discuss that there are health risks associated with E cigs as well and that she should work on discontinuing the e cigs as well.

## 2012-09-28 NOTE — Assessment & Plan Note (Signed)
Clinically stable on victoza.  Continue same.

## 2012-09-28 NOTE — Assessment & Plan Note (Signed)
We discussed diet and exercise. Will obtain TSH.

## 2012-09-28 NOTE — Assessment & Plan Note (Signed)
BP Readings from Last 3 Encounters:  09/26/12 122/80  09/22/12 100/72  07/15/12 129/91   BP is stable, continue current meds.

## 2012-09-28 NOTE — Telephone Encounter (Signed)
pls let pt know that thyroid function, liver function and kidney function look good.

## 2012-09-29 NOTE — Telephone Encounter (Signed)
Detailed message left on cell# and to call if any questions.

## 2012-10-14 ENCOUNTER — Other Ambulatory Visit: Payer: Self-pay | Admitting: Family

## 2012-10-15 NOTE — Telephone Encounter (Signed)
Rx sent in to pharmacy. 

## 2012-10-25 ENCOUNTER — Other Ambulatory Visit: Payer: Self-pay | Admitting: Family

## 2012-10-27 NOTE — Telephone Encounter (Signed)
Rx sent in to pharmacy. 

## 2012-10-31 ENCOUNTER — Other Ambulatory Visit: Payer: Self-pay | Admitting: Family

## 2012-11-19 ENCOUNTER — Other Ambulatory Visit: Payer: Self-pay | Admitting: Family

## 2012-11-29 ENCOUNTER — Other Ambulatory Visit: Payer: Self-pay | Admitting: Internal Medicine

## 2012-11-30 ENCOUNTER — Other Ambulatory Visit: Payer: Self-pay | Admitting: Family

## 2012-12-18 ENCOUNTER — Other Ambulatory Visit: Payer: Self-pay

## 2012-12-23 ENCOUNTER — Encounter: Payer: Self-pay | Admitting: Family

## 2012-12-23 ENCOUNTER — Ambulatory Visit (INDEPENDENT_AMBULATORY_CARE_PROVIDER_SITE_OTHER): Payer: 59 | Admitting: Family

## 2012-12-23 VITALS — BP 120/80 | HR 64 | Temp 97.7°F | Resp 18 | Ht 64.5 in | Wt 186.1 lb

## 2012-12-23 DIAGNOSIS — E119 Type 2 diabetes mellitus without complications: Secondary | ICD-10-CM

## 2012-12-23 DIAGNOSIS — R635 Abnormal weight gain: Secondary | ICD-10-CM

## 2012-12-23 NOTE — Assessment & Plan Note (Signed)
TSH normal last visit.  Discussed importance of regular exercise.  Discussed limiting caloric intake and treats such as daily mocha lattes.

## 2012-12-23 NOTE — Progress Notes (Signed)
Subjective:    Patient ID: [name] [name], female    DOB: 08-17-1950, 62 y.o.   MRN: [id number]  HPI  [name] is a 62 yr old female who presents today for follow up of multiple medical problems.  Weight gain- she has gained 14 pounds since February. Reports that she is not exercising. Stops to get a Mocha Latte every morning at [employer].    DM2- reports sugar is running 15-20 points higher each day recently. She continues the victoza.  Depression- she is maintained on Wellbutrin and abilify.  These meds are managed by psychiatry. Reports that her depression remains well controlled.     Review of Systems    see HPI  Past Medical History  Diagnosis Date  . CAD (coronary artery disease)   . Hyperlipidemia   . Hypertension   . Obstructive sleep apnea (adult) (pediatric)   . Esophageal reflux   . Type II or unspecified type diabetes mellitus without mention of complication, not stated as uncontrolled   . Depressive disorder, not elsewhere classified   . Allergic rhinitis, cause unspecified   . Personal history of goiter     childhood  . ASCUS on Pap smear     History   Social History  . Marital Status: Married    Spouse Name: N/A    Number of Children: 3  . Years of Education: N/A   Occupational History  . branch Programmer, [employer]  .     Social History Main Topics  . Smoking status: Former Smoker -- 46 years    Types: Cigarettes    Quit date: 09/23/2012  . Smokeless tobacco: Never Used     Comment: Uses 1 electronic cigarette daily  . Alcohol Use: Yes     Comment: occasional  . Drug Use: Not on file  . Sexually Active: Not on file   Other Topics Concern  . Not on file   Social History Narrative   Caffeine Use:  2 cups coffee daily   Regular exercise:  No          Past Surgical History  Procedure Laterality Date  . Coronary stent placement  2004  . Goiter removal  1968  . Tubal ligation  1985  . Colposcopy  2012   cervical biopsy (Dr. Juliene Pina)    Family History  Problem Relation Age of Onset  . Cancer Mother     throat, cervical  . Diabetes Mother   . Stroke Mother   . Heart attack Mother   . Cancer Father     prostate  . Diabetes Sister   . Heart attack Sister   . Hypertension Sister   . Hyperlipidemia Sister   . Colon cancer Neg Hx     No Known Allergies  Current Outpatient Prescriptions on File Prior to Visit  Medication Sig Dispense Refill  . ARIPiprazole (ABILIFY) 5 MG tablet Take 5 mg by mouth daily.        Marland Kitchen aspirin 81 MG tablet Take 81 mg by mouth daily.        Marland Kitchen buPROPion (WELLBUTRIN XL) 150 MG 24 hr tablet Take 450 mg by mouth daily.        . Calcium Carbonate-Vitamin D (CALTRATE 600+D) 600-400 MG-UNIT per tablet Take 1 tablet by mouth 2 (two) times daily.      . fluticasone (FLONASE) 50 MCG/ACT nasal spray Place 1 spray into the nose 2 (two) times daily as needed for  rhinitis.  16 g  2  . Insulin Pen Needle (B-D ULTRAFINE III SHORT PEN) 31G X 8 MM MISC use once daily as directed  100 each  2  . Lancets (ONETOUCH ULTRASOFT) lancets TEST BLOOD SUGAR ONCE DAILY  100 each  0  . metoprolol (LOPRESSOR) 50 MG tablet Take 1 tablet (50 mg total) by mouth 2 (two) times daily.  60 tablet  5  . olmesartan-hydrochlorothiazide (BENICAR HCT) 20-12.5 MG per tablet Take 1 tablet by mouth daily.  45 tablet  0  . ONE TOUCH ULTRA TEST test strip TEST once daily  100 each  3  . pantoprazole (PROTONIX) 40 MG tablet take 1 tablet by mouth once daily  30 tablet  5  . rosuvastatin (CRESTOR) 20 MG tablet Take 1 tablet (20 mg total) by mouth daily.  30 tablet  5  . VICTOZA 18 MG/3ML SOPN inject 0.2 milliliters (1.2 MG TOTAL) subcutaneously every morning  6 mL  5   No current facility-administered medications on file prior to visit.    BP 120/80  Pulse 64  Temp(Src) 97.7 F (36.5 C) (Oral)  Resp 18  Ht 5' 4.5" (1.638 m)  Wt 186 lb 1.3 oz (84.405 kg)  BMI 31.46 kg/m2  SpO2 97%    Objective:    Physical Exam  Constitutional: She is oriented to person, place, and time. She appears well-developed and well-nourished. No distress.  HENT:  Head: Normocephalic and atraumatic.  Cardiovascular: Normal rate and regular rhythm.   No murmur heard. Pulmonary/Chest: Effort normal and breath sounds normal. No respiratory distress. She has no wheezes. She has no rales. She exhibits no tenderness.  Musculoskeletal: She exhibits no edema.  Neurological: She is alert and oriented to person, place, and time.  Psychiatric: She has a normal mood and affect. Her behavior is normal. Judgment and thought content normal.          Assessment & Plan:

## 2012-12-23 NOTE — Patient Instructions (Addendum)
Please complete your lab work prior to leaving. Work on cutting back your calorie intake and walking regularly.  Follow up in 3 months.

## 2012-12-23 NOTE — Assessment & Plan Note (Signed)
Per report sugars are running slightly higher.  Likely related to weight gain. Continue victoza, obtain A1C, urine microalbumin.

## 2012-12-24 LAB — HEMOGLOBIN A1C
Hgb A1c MFr Bld: 5.8 % — ABNORMAL HIGH (ref <5.7)
Mean Plasma Glucose: 120 mg/dL — ABNORMAL HIGH (ref <117)

## 2012-12-24 LAB — MICROALBUMIN / CREATININE URINE RATIO
Creatinine, Urine: 39.5 mg/dL
Microalb Creat Ratio: 12.7 mg/g (ref 0.0–30.0)
Microalb, Ur: 0.5 mg/dL (ref 0.00–1.89)

## 2012-12-25 ENCOUNTER — Encounter: Payer: Self-pay | Admitting: Family

## 2013-02-20 ENCOUNTER — Encounter: Payer: Self-pay | Admitting: Family

## 2013-03-04 ENCOUNTER — Other Ambulatory Visit: Payer: Self-pay | Admitting: Family

## 2013-03-31 ENCOUNTER — Ambulatory Visit (INDEPENDENT_AMBULATORY_CARE_PROVIDER_SITE_OTHER): Payer: 59 | Admitting: Family

## 2013-03-31 ENCOUNTER — Encounter: Payer: Self-pay | Admitting: Family

## 2013-03-31 ENCOUNTER — Telehealth: Payer: Self-pay | Admitting: *Deleted

## 2013-03-31 VITALS — BP 130/80 | HR 65 | Temp 97.9°F | Resp 16 | Ht 64.5 in | Wt 190.1 lb

## 2013-03-31 DIAGNOSIS — E119 Type 2 diabetes mellitus without complications: Secondary | ICD-10-CM

## 2013-03-31 DIAGNOSIS — R3915 Urgency of urination: Secondary | ICD-10-CM

## 2013-03-31 DIAGNOSIS — E785 Hyperlipidemia, unspecified: Secondary | ICD-10-CM

## 2013-03-31 DIAGNOSIS — N390 Urinary tract infection, site not specified: Secondary | ICD-10-CM

## 2013-03-31 DIAGNOSIS — J069 Acute upper respiratory infection, unspecified: Secondary | ICD-10-CM

## 2013-03-31 DIAGNOSIS — Z23 Encounter for immunization: Secondary | ICD-10-CM

## 2013-03-31 DIAGNOSIS — I10 Essential (primary) hypertension: Secondary | ICD-10-CM

## 2013-03-31 LAB — LIPID PANEL
Cholesterol: 112 mg/dL (ref 0–200)
HDL: 45 mg/dL (ref >39)
LDL Cholesterol: 42 mg/dL (ref 0–99)
Total CHOL/HDL Ratio: 2.5 Ratio
Triglycerides: 124 mg/dL (ref <150)
VLDL: 25 mg/dL (ref 0–40)

## 2013-03-31 LAB — HEPATIC FUNCTION PANEL
ALT: 23 U/L (ref 0–35)
AST: 18 U/L (ref 0–37)
Albumin: 4.1 g/dL (ref 3.5–5.2)
Alkaline Phosphatase: 63 U/L (ref 39–117)
Bilirubin, Direct: 0.1 mg/dL (ref 0.0–0.3)
Indirect Bilirubin: 0.4 mg/dL (ref 0.0–0.9)
Total Bilirubin: 0.5 mg/dL (ref 0.3–1.2)
Total Protein: 6.6 g/dL (ref 6.0–8.3)

## 2013-03-31 LAB — HEMOGLOBIN A1C
Hgb A1c MFr Bld: 6.2 % — ABNORMAL HIGH (ref <5.7)
Mean Plasma Glucose: 131 mg/dL — ABNORMAL HIGH (ref <117)

## 2013-03-31 LAB — BASIC METABOLIC PANEL WITH GFR
BUN: 14 mg/dL (ref 6–23)
CO2: 24 mEq/L (ref 19–32)
Calcium: 9.1 mg/dL (ref 8.4–10.5)
Chloride: 105 mEq/L (ref 96–112)
Creat: 0.86 mg/dL (ref 0.50–1.10)
GFR, Est African American: 83 mL/min
GFR, Est Non African American: 72 mL/min
Glucose, Bld: 122 mg/dL — ABNORMAL HIGH (ref 70–99)
Potassium: 4.3 mEq/L (ref 3.5–5.3)
Sodium: 142 mEq/L (ref 135–145)

## 2013-03-31 NOTE — Progress Notes (Signed)
Subjective:    Patient ID: [name] [name], female    DOB: 1950-12-18, 62 y.o.   MRN: [id number]  HPI  HTN- continues benicar HCT and metoprolol.  Reports sore throat, nasal drainage, hoarseness, now cough.  Denies associated fever. Started 1 week ago. Drainage is clear. Denies associated fever.    DM2- last A1C was at goal a 5.8.  She is maintained on victoza.  Reports AM urinary urgency.  Review of Systems See HPI  Past Medical History  Diagnosis Date  . CAD (coronary artery disease)   . Hyperlipidemia   . Hypertension   . Obstructive sleep apnea (adult) (pediatric)   . Esophageal reflux   . Type II or unspecified type diabetes mellitus without mention of complication, not stated as uncontrolled   . Depressive disorder, not elsewhere classified   . Allergic rhinitis, cause unspecified   . Personal history of goiter     childhood  . ASCUS on Pap smear     History   Social History  . Marital Status: Married    Spouse Name: N/A    Number of Children: 3  . Years of Education: N/A   Occupational History  . branch Programmer, [employer]  .     Social History Main Topics  . Smoking status: Former Smoker -- 46 years    Types: Cigarettes    Quit date: 09/23/2012  . Smokeless tobacco: Never Used     Comment: Uses vapor cigarette  . Alcohol Use: Yes     Comment: occasional  . Drug Use: Not on file  . Sexual Activity: Not on file   Other Topics Concern  . Not on file   Social History Narrative   Caffeine Use:  2 cups coffee daily   Regular exercise:  No          Past Surgical History  Procedure Laterality Date  . Coronary stent placement  2004  . Goiter removal  1968  . Tubal ligation  1985  . Colposcopy  2012    cervical biopsy ([name] [name])    Family History  Problem Relation Age of Onset  . Cancer Mother     throat, cervical  . Diabetes Mother   . Stroke Mother   . Heart attack Mother   . Cancer Father     prostate  .  Diabetes Sister   . Heart attack Sister   . Hypertension Sister   . Hyperlipidemia Sister   . Colon cancer Neg Hx     No Known Allergies  Current Outpatient Prescriptions on File Prior to Visit  Medication Sig Dispense Refill  . ARIPiprazole (ABILIFY) 5 MG tablet Take 5 mg by mouth daily.        Marland Kitchen aspirin 81 MG tablet Take 81 mg by mouth daily.        Marland Kitchen BENICAR HCT 20-12.5 MG per tablet take 1 tablet by mouth once daily  45 tablet  3  . buPROPion (WELLBUTRIN XL) 150 MG 24 hr tablet Take 450 mg by mouth daily.        . Calcium Carbonate-Vitamin D (CALTRATE 600+D) 600-400 MG-UNIT per tablet Take 1 tablet by mouth 2 (two) times daily.      . Insulin Pen Needle (B-D ULTRAFINE III SHORT PEN) 31G X 8 MM MISC use once daily as directed  100 each  2  . Lancets (ONETOUCH ULTRASOFT) lancets TEST BLOOD SUGAR ONCE DAILY  100 each  0  .  metoprolol (LOPRESSOR) 50 MG tablet Take 1 tablet (50 mg total) by mouth 2 (two) times daily.  60 tablet  5  . ONE TOUCH ULTRA TEST test strip TEST once daily  100 each  3  . pantoprazole (PROTONIX) 40 MG tablet take 1 tablet by mouth once daily  30 tablet  5  . rosuvastatin (CRESTOR) 20 MG tablet Take 1 tablet (20 mg total) by mouth daily.  30 tablet  5  . VICTOZA 18 MG/3ML SOPN inject 0.2 milliliters (1.2 MG TOTAL) subcutaneously every morning  6 mL  5  . fluticasone (FLONASE) 50 MCG/ACT nasal spray Place 1 spray into the nose 2 (two) times daily as needed for rhinitis.  16 g  2   No current facility-administered medications on file prior to visit.    BP 130/80  Pulse 65  Temp(Src) 97.9 F (36.6 C) (Oral)  Resp 16  Ht 5' 4.5" (1.638 m)  Wt 190 lb 1.3 oz (86.22 kg)  BMI 32.14 kg/m2  SpO2 96%         Objective:   Physical Exam  Constitutional: She is oriented to person, place, and time. She appears well-developed and well-nourished. No distress.  Cardiovascular: Normal rate and regular rhythm.   No murmur heard. Pulmonary/Chest: Effort normal and  breath sounds normal. No respiratory distress. She has no wheezes. She has no rales. She exhibits no tenderness.  Musculoskeletal: She exhibits no edema.  Neurological: She is alert and oriented to person, place, and time.  Psychiatric: She has a normal mood and affect. Her behavior is normal. Judgment and thought content normal.          Assessment & Plan:

## 2013-03-31 NOTE — Patient Instructions (Signed)
Please complete lab work prior to leaving. Let us know if congestion worsens or if it is not improved in 1 week. Follow up in 3 months.

## 2013-03-31 NOTE — Telephone Encounter (Signed)
Per verbal from Provider, we needed to order a U/A and culture for pt's urinary urgency. Left message on cell# for pt to return my call.

## 2013-04-01 ENCOUNTER — Encounter: Payer: Self-pay | Admitting: Family

## 2013-04-01 NOTE — Telephone Encounter (Signed)
Pt left message returning my call. Attempted to reach pt and left detailed message to return to the lab to give urine sample one day this week and to call if she has questions.

## 2013-04-02 DIAGNOSIS — R3915 Urgency of urination: Secondary | ICD-10-CM | POA: Insufficient documentation

## 2013-04-02 DIAGNOSIS — J069 Acute upper respiratory infection, unspecified: Secondary | ICD-10-CM | POA: Insufficient documentation

## 2013-04-02 LAB — URINALYSIS, MICROSCOPIC ONLY
Bacteria, UA: NONE SEEN
Casts: NONE SEEN
Crystals: NONE SEEN

## 2013-04-02 LAB — URINALYSIS, ROUTINE W REFLEX MICROSCOPIC
Bilirubin Urine: NEGATIVE
Glucose, UA: NEGATIVE mg/dL
Hgb urine dipstick: NEGATIVE
Ketones, ur: NEGATIVE mg/dL
Nitrite: NEGATIVE
Protein, ur: NEGATIVE mg/dL
Specific Gravity, Urine: 1.012 (ref 1.005–1.030)
Urobilinogen, UA: 0.2 mg/dL (ref 0.0–1.0)
pH: 7 (ref 5.0–8.0)

## 2013-04-02 NOTE — Assessment & Plan Note (Signed)
Symptoms most consistent with viral URI.  Advised pt to call if symptoms worsen or if not improved in 1 week.

## 2013-04-02 NOTE — Assessment & Plan Note (Signed)
Clinically stable on victoza.  Obtain a1c.

## 2013-04-02 NOTE — Assessment & Plan Note (Signed)
BP Readings from Last 3 Encounters:  03/31/13 130/80  12/23/12 120/80  09/26/12 122/80   BP stable on current meds.  Continue same. Obtaine bmet.

## 2013-04-02 NOTE — Assessment & Plan Note (Signed)
Will have pt return for urine culture. See phone note.

## 2013-04-03 LAB — URINE CULTURE
Colony Count: NO GROWTH
Organism ID, Bacteria: NO GROWTH

## 2013-04-19 ENCOUNTER — Other Ambulatory Visit: Payer: Self-pay | Admitting: Family

## 2013-04-30 ENCOUNTER — Ambulatory Visit: Payer: 59 | Admitting: Cardiology

## 2013-05-01 ENCOUNTER — Ambulatory Visit (INDEPENDENT_AMBULATORY_CARE_PROVIDER_SITE_OTHER): Payer: 59 | Admitting: Cardiology

## 2013-05-01 ENCOUNTER — Encounter: Payer: Self-pay | Admitting: Cardiology

## 2013-05-01 VITALS — BP 122/84 | HR 63 | Ht 64.0 in | Wt 187.0 lb

## 2013-05-01 DIAGNOSIS — I10 Essential (primary) hypertension: Secondary | ICD-10-CM

## 2013-05-01 DIAGNOSIS — E785 Hyperlipidemia, unspecified: Secondary | ICD-10-CM

## 2013-05-01 DIAGNOSIS — F17200 Nicotine dependence, unspecified, uncomplicated: Secondary | ICD-10-CM

## 2013-05-01 DIAGNOSIS — I2510 Atherosclerotic heart disease of native coronary artery without angina pectoris: Secondary | ICD-10-CM

## 2013-05-01 NOTE — Patient Instructions (Signed)
Your physician wants you to follow-up in: ONE YEAR WITH DR CRENSHAW You will receive a reminder letter in the mail two months in advance. If you don't receive a letter, please call our office to schedule the follow-up appointment.  

## 2013-05-01 NOTE — Assessment & Plan Note (Signed)
Blood pressure controlled. Continue present medications. 

## 2013-05-01 NOTE — Assessment & Plan Note (Signed)
Patient has discontinued. 

## 2013-05-01 NOTE — Assessment & Plan Note (Signed)
Continue statin. Lipids and liver monitored by primary care. 

## 2013-05-01 NOTE — Progress Notes (Signed)
HPI: FU coronary disease. She has had a prior PCI of her diagonal. Her most recent catheterization in April of 2004 showed no obstructive disease, and her ejection fraction was normal. Her most recent Myoview was performed in Dec 2012. EF 77 and normal perfusion. I last saw her in Nov 2013. Since then she does have dyspnea with more moderate exertion relieved with rest. There is no associated chest pain. There is no orthopnea, PND or pedal edema. There is no palpitations or syncope.    Current Outpatient Prescriptions  Medication Sig Dispense Refill  . ARIPiprazole (ABILIFY) 5 MG tablet Take 5 mg by mouth daily.        Marland Kitchen aspirin 81 MG tablet Take 81 mg by mouth daily.        Marland Kitchen BENICAR HCT 20-12.5 MG per tablet take 1 tablet by mouth once daily  45 tablet  3  . buPROPion (WELLBUTRIN XL) 150 MG 24 hr tablet Take 450 mg by mouth daily.        . Calcium Carbonate-Vitamin D (CALTRATE 600+D) 600-400 MG-UNIT per tablet Take 1 tablet by mouth 2 (two) times daily.      . CRESTOR 20 MG tablet take 1 tablet by mouth once daily  30 tablet  3  . fluticasone (FLONASE) 50 MCG/ACT nasal spray Place 1 spray into the nose 2 (two) times daily as needed for rhinitis.  16 g  2  . Insulin Pen Needle (B-D ULTRAFINE III SHORT PEN) 31G X 8 MM MISC use once daily as directed  100 each  2  . Lancets (ONETOUCH ULTRASOFT) lancets TEST BLOOD SUGAR ONCE DAILY  100 each  0  . metoprolol (LOPRESSOR) 50 MG tablet Take 1 tablet (50 mg total) by mouth 2 (two) times daily.  60 tablet  5  . ONE TOUCH ULTRA TEST test strip TEST once daily  100 each  3  . pantoprazole (PROTONIX) 40 MG tablet take 1 tablet by mouth once daily  30 tablet  5  . VICTOZA 18 MG/3ML SOPN inject 0.2 milliliters (1.2 MG TOTAL) subcutaneously every morning  6 mL  3   No current facility-administered medications for this visit.     Past Medical History  Diagnosis Date  . CAD (coronary artery disease)   . Hyperlipidemia   . Hypertension   .  Obstructive sleep apnea (adult) (pediatric)   . Esophageal reflux   . Type II or unspecified type diabetes mellitus without mention of complication, not stated as uncontrolled   . Depressive disorder, not elsewhere classified   . Allergic rhinitis, cause unspecified   . Personal history of goiter     childhood  . ASCUS on Pap smear     Past Surgical History  Procedure Laterality Date  . Coronary stent placement  2004  . Goiter removal  1968  . Tubal ligation  1985  . Colposcopy  2012    cervical biopsy (Dr. Juliene Pina)    History   Social History  . Marital Status: Married    Spouse Name: N/A    Number of Children: 3  . Years of Education: N/A   Occupational History  . branch Programmer, [employer]  .     Social History Main Topics  . Smoking status: Former Smoker -- 46 years    Types: Cigarettes    Quit date: 09/23/2012  . Smokeless tobacco: Never Used     Comment: Uses vapor cigarette  .  Alcohol Use: Yes     Comment: occasional  . Drug Use: Not on file  . Sexual Activity: Not on file   Other Topics Concern  . Not on file   Social History Narrative   Caffeine Use:  2 cups coffee daily   Regular exercise:  No          ROS: no fevers or chills, productive cough, hemoptysis, dysphasia, odynophagia, melena, hematochezia, dysuria, hematuria, rash, seizure activity, orthopnea, PND, pedal edema, claudication. Remaining systems are negative.  Physical Exam: Well-developed well-nourished in no acute distress.  Skin is warm and dry.  HEENT is normal.  Neck is supple.  Chest is clear to auscultation with normal expansion.  Cardiovascular exam is regular rate and rhythm.  Abdominal exam nontender or distended. No masses palpated. Extremities show no edema. neuro grossly intact  ECG sinus rhythm at a rate of 63. No ST changes.

## 2013-05-01 NOTE — Assessment & Plan Note (Signed)
Continue aspirin and statin. 

## 2013-05-03 ENCOUNTER — Other Ambulatory Visit: Payer: Self-pay | Admitting: Family

## 2013-05-04 NOTE — Telephone Encounter (Signed)
Rx request to pharmacy/SLS  

## 2013-06-19 ENCOUNTER — Telehealth: Payer: Self-pay | Admitting: *Deleted

## 2013-06-19 DIAGNOSIS — G4733 Obstructive sleep apnea (adult) (pediatric): Secondary | ICD-10-CM

## 2013-06-19 NOTE — Telephone Encounter (Signed)
Received message from pt requesting Rx for CPAP mask be sent to [facility].  Left detailed message on cell# to call with type of mask that she uses.

## 2013-06-22 NOTE — Telephone Encounter (Signed)
Pt returned my call stating she needs nasal mask and tubing for her CPAP.  Please advise.

## 2013-06-28 ENCOUNTER — Other Ambulatory Visit: Payer: Self-pay | Admitting: Family

## 2013-07-07 ENCOUNTER — Ambulatory Visit: Payer: 59 | Admitting: Family

## 2013-07-08 ENCOUNTER — Encounter: Payer: Self-pay | Admitting: Family

## 2013-07-08 ENCOUNTER — Ambulatory Visit (INDEPENDENT_AMBULATORY_CARE_PROVIDER_SITE_OTHER): Payer: 59 | Admitting: Family

## 2013-07-08 VITALS — BP 128/78 | HR 65 | Temp 98.3°F | Resp 16 | Ht 64.5 in | Wt 189.0 lb

## 2013-07-08 DIAGNOSIS — F3289 Other specified depressive episodes: Secondary | ICD-10-CM

## 2013-07-08 DIAGNOSIS — G4733 Obstructive sleep apnea (adult) (pediatric): Secondary | ICD-10-CM

## 2013-07-08 DIAGNOSIS — B353 Tinea pedis: Secondary | ICD-10-CM

## 2013-07-08 DIAGNOSIS — E119 Type 2 diabetes mellitus without complications: Secondary | ICD-10-CM

## 2013-07-08 DIAGNOSIS — E785 Hyperlipidemia, unspecified: Secondary | ICD-10-CM

## 2013-07-08 DIAGNOSIS — I10 Essential (primary) hypertension: Secondary | ICD-10-CM

## 2013-07-08 DIAGNOSIS — F329 Major depressive disorder, single episode, unspecified: Secondary | ICD-10-CM

## 2013-07-08 LAB — HEMOGLOBIN A1C
Hgb A1c MFr Bld: 6.1 % — ABNORMAL HIGH (ref <5.7)
Mean Plasma Glucose: 128 mg/dL — ABNORMAL HIGH (ref <117)

## 2013-07-08 NOTE — Assessment & Plan Note (Signed)
Reports that her machine is old and not functioning well, [facility] uses [facility]. Will request new machine.

## 2013-07-08 NOTE — Assessment & Plan Note (Signed)
BP stable on current meds.   

## 2013-07-08 NOTE — Assessment & Plan Note (Signed)
At goal on statin, obtain FLP.

## 2013-07-08 NOTE — Patient Instructions (Signed)
Apply lotrimin cream or spray to both feet once daily for athlete's foot.  Please complete lab work prior to leaving. Follow up in 3 months.

## 2013-07-08 NOTE — Assessment & Plan Note (Signed)
Stable, defer management to psychiatry (Dr. Clovis Pu)

## 2013-07-08 NOTE — Progress Notes (Signed)
Pre visit review using our clinic review tool, if applicable. No additional management support is needed unless otherwise documented below in the visit note. 

## 2013-07-08 NOTE — Assessment & Plan Note (Signed)
New. Recommended trial of lotrimin spray or cream.

## 2013-07-08 NOTE — Progress Notes (Signed)
Subjective:    Patient ID: [name] [name], female    DOB: 12/22/1950, 64 y.o.   MRN: [id number]  HPI  [name] [name] is a 63 yr old female who presents today for follow up.  1) HTN- she is maintained on benicar hct. BP Readings from Last 3 Encounters:  07/08/13 128/78  05/01/13 122/84  03/31/13 130/80    2) DM2- She continues victoza. Not checking sugars at home.  Denies symptomatic hypoglycemia.  Lab Results  Component Value Date   HGBA1C 6.2* 03/31/2013   3) Hyperlipidemia-  On crestor 20mg .  Last visit LDL was at goal.   4) Depression- on wellbutrin/abilify- follows with Dr. Oliver Pila psych- reports well controlled.    5) OSA- needs new machine- machine 2006- she works with [facility].    Review of Systems See HPI  Past Medical History  Diagnosis Date  . CAD (coronary artery disease)   . Hyperlipidemia   . Hypertension   . Obstructive sleep apnea (adult) (pediatric)   . Esophageal reflux   . Type II or unspecified type diabetes mellitus without mention of complication, not stated as uncontrolled   . Depressive disorder, not elsewhere classified   . Allergic rhinitis, cause unspecified   . Personal history of goiter     childhood  . ASCUS on Pap smear     History   Social History  . Marital Status: Married    Spouse Name: N/A    Number of Children: 3  . Years of Education: N/A   Occupational History  . 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     Social History Main Topics  . Smoking status: Former Smoker -- 46 years    Types: Cigarettes    Quit date: 09/23/2012  . Smokeless tobacco: Never Used     Comment: Uses vapor cigarette  . Alcohol Use: Yes     Comment: occasional  . Drug Use: Not on file  . Sexual Activity: Not on file   Other Topics Concern  . Not on file   Social History Narrative   Caffeine Use:  2 cups coffee daily   Regular exercise:  No          Past Surgical History  Procedure Laterality Date  .  Coronary stent placement  2004  . Goiter removal  1968  . Tubal ligation  1985  . Colposcopy  2012    cervical biopsy (Dr. Benjie Karvonen)    Family History  Problem Relation Age of Onset  . Cancer Mother     throat, cervical  . Diabetes Mother   . Stroke Mother   . Heart attack Mother   . Cancer Father     prostate  . Diabetes Sister   . Heart attack Sister   . Hypertension Sister   . Hyperlipidemia Sister   . Colon cancer Neg Hx     No Known Allergies  Current Outpatient Prescriptions on File Prior to Visit  Medication Sig Dispense Refill  . ARIPiprazole (ABILIFY) 5 MG tablet Take 5 mg by mouth daily.        Marland Kitchen aspirin 81 MG tablet Take 81 mg by mouth daily.        Marland Kitchen BENICAR HCT 20-12.5 MG per tablet take 1 tablet by mouth once daily  45 tablet  3  . buPROPion (WELLBUTRIN XL) 150 MG 24 hr tablet Take 450 mg by mouth daily.        Marland Kitchen  Calcium Carbonate-Vitamin D (CALTRATE 600+D) 600-400 MG-UNIT per tablet Take 1 tablet by mouth 2 (two) times daily.      . CRESTOR 20 MG tablet take 1 tablet by mouth once daily  30 tablet  3  . Lancets (ONETOUCH ULTRASOFT) lancets TEST BLOOD SUGAR ONCE DAILY  100 each  0  . metoprolol (LOPRESSOR) 50 MG tablet take 1 tablet by mouth twice a day  60 tablet  2  . ONE TOUCH ULTRA TEST test strip TEST once daily  100 each  3  . pantoprazole (PROTONIX) 40 MG tablet take 1 tablet by mouth once daily  30 tablet  5  . RA PEN NEEDLES 31G X 8 MM MISC use as directed once daily  100 each  2  . VICTOZA 18 MG/3ML SOPN inject 0.2 milliliters (1.2 MG TOTAL) subcutaneously every morning  6 mL  3   No current facility-administered medications on file prior to visit.    BP 128/78  Pulse 65  Temp(Src) 98.3 F (36.8 C) (Oral)  Resp 16  Ht 5' 4.5" (1.638 m)  Wt 189 lb 0.6 oz (85.748 kg)  BMI 31.96 kg/m2  SpO2 97%       Objective:   Physical Exam  Constitutional: She is oriented to person, place, and time. She appears well-developed and well-nourished. No  distress.  HENT:  Head: Normocephalic and atraumatic.  Cardiovascular: Normal rate and regular rhythm.   No murmur heard. Pulmonary/Chest: Effort normal and breath sounds normal. No respiratory distress. She has no wheezes. She has no rales. She exhibits no tenderness.  Neurological: She is alert and oriented to person, place, and time.  Skin: Skin is warm and dry.  Psychiatric: She has a normal mood and affect. Her behavior is normal. Judgment and thought content normal.          Assessment & Plan:

## 2013-07-08 NOTE — Assessment & Plan Note (Signed)
Stable on victoza, obtain follow up A1C.

## 2013-07-09 ENCOUNTER — Encounter: Payer: Self-pay | Admitting: Family

## 2013-07-10 ENCOUNTER — Telehealth: Payer: Self-pay | Admitting: Family

## 2013-07-10 ENCOUNTER — Telehealth: Payer: Self-pay

## 2013-07-10 NOTE — Telephone Encounter (Signed)
Relevant patient education assigned to patient using Emmi. ° °

## 2013-08-25 ENCOUNTER — Other Ambulatory Visit: Payer: Self-pay | Admitting: Family

## 2013-09-24 ENCOUNTER — Other Ambulatory Visit: Payer: Self-pay | Admitting: Family

## 2013-09-25 NOTE — Telephone Encounter (Signed)
Rx request to pharmacy/SLS  

## 2013-10-27 ENCOUNTER — Ambulatory Visit (INDEPENDENT_AMBULATORY_CARE_PROVIDER_SITE_OTHER): Payer: 59 | Admitting: Family

## 2013-10-27 ENCOUNTER — Encounter: Payer: Self-pay | Admitting: Family

## 2013-10-27 VITALS — BP 110/78 | HR 61 | Temp 97.5°F | Resp 18 | Ht 64.5 in | Wt 189.0 lb

## 2013-10-27 DIAGNOSIS — G4733 Obstructive sleep apnea (adult) (pediatric): Secondary | ICD-10-CM

## 2013-10-27 DIAGNOSIS — F3289 Other specified depressive episodes: Secondary | ICD-10-CM

## 2013-10-27 DIAGNOSIS — F329 Major depressive disorder, single episode, unspecified: Secondary | ICD-10-CM

## 2013-10-27 DIAGNOSIS — H6120 Impacted cerumen, unspecified ear: Secondary | ICD-10-CM

## 2013-10-27 DIAGNOSIS — R32 Unspecified urinary incontinence: Secondary | ICD-10-CM

## 2013-10-27 DIAGNOSIS — I10 Essential (primary) hypertension: Secondary | ICD-10-CM

## 2013-10-27 DIAGNOSIS — E119 Type 2 diabetes mellitus without complications: Secondary | ICD-10-CM

## 2013-10-27 DIAGNOSIS — E785 Hyperlipidemia, unspecified: Secondary | ICD-10-CM

## 2013-10-27 LAB — HEPATIC FUNCTION PANEL
ALT: 22 U/L (ref 0–35)
AST: 16 U/L (ref 0–37)
Albumin: 4.1 g/dL (ref 3.5–5.2)
Alkaline Phosphatase: 59 U/L (ref 39–117)
Bilirubin, Direct: 0.1 mg/dL (ref 0.0–0.3)
Indirect Bilirubin: 0.5 mg/dL (ref 0.2–1.2)
Total Bilirubin: 0.6 mg/dL (ref 0.2–1.2)
Total Protein: 6.5 g/dL (ref 6.0–8.3)

## 2013-10-27 LAB — BASIC METABOLIC PANEL WITH GFR
BUN: 18 mg/dL (ref 6–23)
CO2: 27 mEq/L (ref 19–32)
Calcium: 9.1 mg/dL (ref 8.4–10.5)
Chloride: 107 mEq/L (ref 96–112)
Creat: 0.79 mg/dL (ref 0.50–1.10)
GFR, Est African American: >89 mL/min
GFR, Est Non African American: 80 mL/min
Glucose, Bld: 127 mg/dL — ABNORMAL HIGH (ref 70–99)
Potassium: 4.1 mEq/L (ref 3.5–5.3)
Sodium: 140 mEq/L (ref 135–145)

## 2013-10-27 LAB — HEMOGLOBIN A1C
Hgb A1c MFr Bld: 5.9 % — ABNORMAL HIGH (ref <5.7)
Mean Plasma Glucose: 123 mg/dL — ABNORMAL HIGH (ref <117)

## 2013-10-27 MED ORDER — METOPROLOL TARTRATE 50 MG PO TABS: ORAL_TABLET | ORAL | Status: DC

## 2013-10-27 MED ORDER — LIRAGLUTIDE 18 MG/3ML ~~LOC~~ SOPN: PEN_INJECTOR | SUBCUTANEOUS | Status: DC

## 2013-10-27 MED ORDER — PANTOPRAZOLE SODIUM 40 MG PO TBEC: DELAYED_RELEASE_TABLET | ORAL | Status: DC

## 2013-10-27 MED ORDER — OLMESARTAN MEDOXOMIL-HCTZ 20-12.5 MG PO TABS: ORAL_TABLET | ORAL | Status: DC

## 2013-10-27 MED ORDER — ROSUVASTATIN CALCIUM 20 MG PO TABS: ORAL_TABLET | ORAL | Status: DC

## 2013-10-27 NOTE — Progress Notes (Signed)
Pre visit review using our clinic review tool, if applicable. No additional management support is needed unless otherwise documented below in the visit note. 

## 2013-10-27 NOTE — Progress Notes (Signed)
Subjective:    Patient ID: [name] [name], female    DOB: Oct 22, 1950, 63 y.o.   MRN: [id number]  HPI  [name] [name] is a 62 yr old female who presents today for follow up of multiple medical problems:  Diabetes- maintained on victoza.  Lab Results  Component Value Date   HGBA1C 6.1* 07/08/2013   HTN-  Currently maintained on benicar hct and metoprolol.  Depression - on abilify, wellbutrin. Followed by psychiatry- Dr. Clovis Pu. Reports well controlled.   Sleep Apnea- last visit a new machine was requested.  Received machine and tolerating.  Feeling better.    Hearing Problem Pt notes decreased hearing in left ear x 2 months. Used a Q tip and clogged it up."  Then used an ear wash which helped.    Urinary Incontinence- Pt notes urinary urgency and incontinence in the mornings x 2 months.reports that when she wakes up and stands in the AM she has urinary incontinence sometimes. Denies frequency/urgency or burning throughout the day.  Declines referral to urology at this time.    Review of Systems See HPI  Past Medical History  Diagnosis Date  . CAD (coronary artery disease)   . Hyperlipidemia   . Hypertension   . Obstructive sleep apnea (adult) (pediatric)   . Esophageal reflux   . Type II or unspecified type diabetes mellitus without mention of complication, not stated as uncontrolled   . Depressive disorder, not elsewhere classified   . Allergic rhinitis, cause unspecified   . Personal history of goiter     childhood  . ASCUS on Pap smear     History   Social History  . Marital Status: Married    Spouse Name: N/A    Number of Children: 3  . Years of Education: N/A   Occupational History  . 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     Social History Main Topics  . Smoking status: Former Smoker -- 46 years    Types: Cigarettes    Quit date: 09/23/2012  . Smokeless tobacco: Never Used     Comment: Uses vapor cigarette  . Alcohol Use: Yes     Comment:  occasional  . Drug Use: Not on file  . Sexual Activity: Not on file   Other Topics Concern  . Not on file   Social History Narrative   Caffeine Use:  2 cups coffee daily   Regular exercise:  No          Past Surgical History  Procedure Laterality Date  . Coronary stent placement  2004  . Goiter removal  1968  . Tubal ligation  1985  . Colposcopy  2012    cervical biopsy (Dr. Benjie Karvonen)    Family History  Problem Relation Age of Onset  . Cancer Mother     throat, cervical  . Diabetes Mother   . Stroke Mother   . Heart attack Mother   . Cancer Father     prostate  . Diabetes Sister   . Heart attack Sister   . Hypertension Sister   . Hyperlipidemia Sister   . Colon cancer Neg Hx     No Known Allergies  Current Outpatient Prescriptions on File Prior to Visit  Medication Sig Dispense Refill  . ARIPiprazole (ABILIFY) 5 MG tablet Take 5 mg by mouth daily.        Marland Kitchen aspirin 81 MG tablet Take 81 mg by mouth daily.        Marland Kitchen  BENICAR HCT 20-12.5 MG per tablet take 1 tablet by mouth once daily  45 tablet  3  . buPROPion (WELLBUTRIN XL) 150 MG 24 hr tablet Take 450 mg by mouth daily.        . Calcium Carbonate-Vitamin D (CALTRATE 600+D) 600-400 MG-UNIT per tablet Take 1 tablet by mouth 2 (two) times daily.      . CRESTOR 20 MG tablet take 1 tablet by mouth once daily  30 tablet  3  . Lancets (ONETOUCH ULTRASOFT) lancets TEST BLOOD SUGAR ONCE DAILY  100 each  0  . metoprolol (LOPRESSOR) 50 MG tablet take 1 tablet by mouth twice a day  60 tablet  2  . ONE TOUCH ULTRA TEST test strip TEST once daily  100 each  3  . pantoprazole (PROTONIX) 40 MG tablet take 1 tablet by mouth once daily  30 tablet  5  . RA PEN NEEDLES 31G X 8 MM MISC use as directed once daily  100 each  2  . VICTOZA 18 MG/3ML SOPN inject 1.2 milligram subcutaneously every morning  6 mL  3   No current facility-administered medications on file prior to visit.    BP 110/78  Pulse 61  Temp(Src) 97.5 F (36.4 C)  (Oral)  Resp 18  Ht 5' 4.5" (1.638 m)  Wt 189 lb 0.6 oz (85.748 kg)  BMI 31.96 kg/m2  SpO2 98%       Objective:   Physical Exam  Constitutional: She is oriented to person, place, and time. She appears well-developed and well-nourished. No distress.  HENT:  Head: Normocephalic and atraumatic.  L ear, occluded by cerumen.  After removal of cerumen with lavage, normal TM is revealed.   Cardiovascular: Normal rate and regular rhythm.   No murmur heard. Pulmonary/Chest: Effort normal and breath sounds normal. No respiratory distress. She has no wheezes. She has no rales. She exhibits no tenderness.  Musculoskeletal: She exhibits no edema.  Neurological: She is alert and oriented to person, place, and time.  Psychiatric: She has a normal mood and affect. Her behavior is normal. Judgment and thought content normal.          Assessment & Plan:

## 2013-10-27 NOTE — Patient Instructions (Addendum)
Please complete lab work prior to leaving.   Please schedule a follow up appointment in 3 months.  

## 2013-10-28 ENCOUNTER — Telehealth: Payer: Self-pay | Admitting: Family

## 2013-10-28 LAB — URINALYSIS, ROUTINE W REFLEX MICROSCOPIC
Bilirubin Urine: NEGATIVE
Glucose, UA: NEGATIVE mg/dL
Hgb urine dipstick: NEGATIVE
Ketones, ur: NEGATIVE mg/dL
Nitrite: NEGATIVE
Protein, ur: NEGATIVE mg/dL
Specific Gravity, Urine: 1.008 (ref 1.005–1.030)
Urobilinogen, UA: 0.2 mg/dL (ref 0.0–1.0)
pH: 7 (ref 5.0–8.0)

## 2013-10-28 LAB — URINE CULTURE: Colony Count: 25000

## 2013-10-28 LAB — URINALYSIS, MICROSCOPIC ONLY
Casts: NONE SEEN
Crystals: NONE SEEN
Squamous Epithelial / LPF: NONE SEEN

## 2013-10-28 MED ORDER — CIPROFLOXACIN HCL 250 MG PO TABS: 250.0000 mg | ORAL_TABLET | Freq: Two times a day (BID) | ORAL | Status: DC

## 2013-10-28 NOTE — Telephone Encounter (Signed)
Urine culture grew a small amount of bacteria. I would recommend that she start cipro x 3 days. Liver function and kidney function are normal.  Sugar control is excellent.

## 2013-10-29 DIAGNOSIS — H6120 Impacted cerumen, unspecified ear: Secondary | ICD-10-CM | POA: Insufficient documentation

## 2013-10-29 DIAGNOSIS — R32 Unspecified urinary incontinence: Secondary | ICD-10-CM | POA: Insufficient documentation

## 2013-10-29 NOTE — Telephone Encounter (Signed)
LMOM with contact name and number [for return call, if needed] RE: results and further provider instructions/SLS  

## 2013-10-29 NOTE — Assessment & Plan Note (Signed)
Declines referral to urology. Urine culture grew 25K mult morphotypes, rx with cipro.

## 2013-10-29 NOTE — Assessment & Plan Note (Signed)
Resolved after lavage/irrigation by CMA.

## 2013-10-29 NOTE — Assessment & Plan Note (Signed)
Stable on victoza. Continue same.

## 2013-10-29 NOTE — Assessment & Plan Note (Signed)
Stable, management per psychiatry.

## 2013-10-29 NOTE — Assessment & Plan Note (Signed)
BP Readings from Last 3 Encounters:  10/27/13 110/78  07/08/13 128/78  05/01/13 122/84   BP stable on current meds. Continue same.

## 2013-10-29 NOTE — Assessment & Plan Note (Signed)
Clinically improved now that she is back on CPAP. Continue CPAP.

## 2014-01-18 ENCOUNTER — Encounter: Payer: Self-pay | Admitting: Gastroenterology

## 2014-01-26 ENCOUNTER — Ambulatory Visit: Payer: 59 | Admitting: Family

## 2014-02-03 ENCOUNTER — Ambulatory Visit (INDEPENDENT_AMBULATORY_CARE_PROVIDER_SITE_OTHER): Payer: 59 | Admitting: Family

## 2014-02-03 ENCOUNTER — Encounter: Payer: Self-pay | Admitting: Family

## 2014-02-03 VITALS — BP 130/86 | HR 85 | Temp 98.4°F | Resp 16 | Ht 64.5 in | Wt 191.0 lb

## 2014-02-03 DIAGNOSIS — E119 Type 2 diabetes mellitus without complications: Secondary | ICD-10-CM

## 2014-02-03 DIAGNOSIS — B351 Tinea unguium: Secondary | ICD-10-CM

## 2014-02-03 DIAGNOSIS — B353 Tinea pedis: Secondary | ICD-10-CM

## 2014-02-03 DIAGNOSIS — I10 Essential (primary) hypertension: Secondary | ICD-10-CM

## 2014-02-03 DIAGNOSIS — F3289 Other specified depressive episodes: Secondary | ICD-10-CM

## 2014-02-03 DIAGNOSIS — F329 Major depressive disorder, single episode, unspecified: Secondary | ICD-10-CM

## 2014-02-03 DIAGNOSIS — Z23 Encounter for immunization: Secondary | ICD-10-CM

## 2014-02-03 LAB — HEMOGLOBIN A1C
Hgb A1c MFr Bld: 6.4 % — ABNORMAL HIGH (ref <5.7)
Mean Plasma Glucose: 137 mg/dL — ABNORMAL HIGH (ref <117)

## 2014-02-03 LAB — BASIC METABOLIC PANEL
BUN: 13 mg/dL (ref 6–23)
CO2: 27 mEq/L (ref 19–32)
Calcium: 8.8 mg/dL (ref 8.4–10.5)
Chloride: 106 mEq/L (ref 96–112)
Creat: 0.78 mg/dL (ref 0.50–1.10)
Glucose, Bld: 121 mg/dL — ABNORMAL HIGH (ref 70–99)
Potassium: 4.1 mEq/L (ref 3.5–5.3)
Sodium: 141 mEq/L (ref 135–145)

## 2014-02-03 MED ORDER — TERBINAFINE HCL 250 MG PO TABS: 250.0000 mg | ORAL_TABLET | Freq: Every day | ORAL | Status: DC

## 2014-02-03 NOTE — Progress Notes (Signed)
Pre visit review using our clinic review tool, if applicable. No additional management support is needed unless otherwise documented below in the visit note. 

## 2014-02-03 NOTE — Progress Notes (Signed)
Subjective:    Patient ID: [name] [name], female    DOB: August 23, 1950, 63 y.o.   MRN: [id number]  HPI  [name] [name] is a 63 yr old female who presents today for follow up of multiple medical problem.   1) DM2-She is maintained on victoza.  She denies hypoglycemia.  Last eye exam was 1 year ago.  Lab Results  Component Value Date   HGBA1C 5.9* 10/27/2013   HGBA1C 6.1* 07/08/2013   HGBA1C 6.2* 03/31/2013   Lab Results  Component Value Date   MICROALBUR 0.50 12/23/2012   LDLCALC 42 03/31/2013   CREATININE 0.79 10/27/2013   2) HTN- she is maintianed on metoprolol, omesartan-hctz.  Denies CP/SOB or swelling.  BP Readings from Last 3 Encounters:  02/03/14 130/86  10/27/13 110/78  07/08/13 128/78   3) Depression- she follows with psychiatry.  Currently on abilify. Reports that depression is well controlled.   4) Nail discoloration- notes discoloration of both great toes x 2-3 weeks.     Review of Systems See HPI  Past Medical History  Diagnosis Date  . CAD (coronary artery disease)   . Hyperlipidemia   . Hypertension   . Obstructive sleep apnea (adult) (pediatric)   . Esophageal reflux   . Type II or unspecified type diabetes mellitus without mention of complication, not stated as uncontrolled   . Depressive disorder, not elsewhere classified   . Allergic rhinitis, cause unspecified   . Personal history of goiter     childhood  . ASCUS on Pap smear     History   Social History  . Marital Status: Married    Spouse Name: N/A    Number of Children: 3  . Years of Education: N/A   Occupational History  . 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     Social History Main Topics  . Smoking status: Former Smoker -- 46 years    Types: Cigarettes    Quit date: 09/23/2012  . Smokeless tobacco: Never Used     Comment: Uses vapor cigarette  . Alcohol Use: Yes     Comment: occasional  . Drug Use: Not on file  . Sexual Activity: Not on file   Other Topics  Concern  . Not on file   Social History Narrative   Caffeine Use:  2 cups coffee daily   Regular exercise:  No          Past Surgical History  Procedure Laterality Date  . Coronary stent placement  2004  . Goiter removal  1968  . Tubal ligation  1985  . Colposcopy  2012    cervical biopsy (Dr. Benjie Karvonen)    Family History  Problem Relation Age of Onset  . Cancer Mother     throat, cervical  . Diabetes Mother   . Stroke Mother   . Heart attack Mother   . Cancer Father     prostate  . Diabetes Sister   . Heart attack Sister   . Hypertension Sister   . Hyperlipidemia Sister   . Colon cancer Neg Hx     No Known Allergies  Current Outpatient Prescriptions on File Prior to Visit  Medication Sig Dispense Refill  . ARIPiprazole (ABILIFY) 5 MG tablet Take 5 mg by mouth daily.        Marland Kitchen aspirin 81 MG tablet Take 81 mg by mouth daily.        Marland Kitchen buPROPion (WELLBUTRIN XL) 150 MG 24  hr tablet Take 450 mg by mouth daily.        . Calcium Carbonate-Vitamin D (CALTRATE 600+D) 600-400 MG-UNIT per tablet Take 1 tablet by mouth 2 (two) times daily.      . Lancets (ONETOUCH ULTRASOFT) lancets TEST BLOOD SUGAR ONCE DAILY  100 each  0  . Liraglutide (VICTOZA) 18 MG/3ML SOPN inject 1.2 milligram subcutaneously every morning  6 mL  5  . metoprolol (LOPRESSOR) 50 MG tablet take 1 tablet by mouth twice a day  60 tablet  5  . olmesartan-hydrochlorothiazide (BENICAR HCT) 20-12.5 MG per tablet take 1 tablet by mouth once daily  45 tablet  5  . ONE TOUCH ULTRA TEST test strip TEST once daily  100 each  3  . pantoprazole (PROTONIX) 40 MG tablet take 1 tablet by mouth once daily  30 tablet  5  . RA PEN NEEDLES 31G X 8 MM MISC use as directed once daily  100 each  2  . rosuvastatin (CRESTOR) 20 MG tablet take 1 tablet by mouth once daily  30 tablet  5   No current facility-administered medications on file prior to visit.    BP 130/86  Pulse 85  Temp(Src) 98.4 F (36.9 C) (Oral)  Resp 16  Ht 5'  4.5" (1.638 m)  Wt 191 lb (86.637 kg)  BMI 32.29 kg/m2  SpO2 97%       Objective:   Physical Exam  Constitutional: She is oriented to person, place, and time. She appears well-developed and well-nourished. No distress.  HENT:  Head: Normocephalic and atraumatic.  Cardiovascular: Normal rate and regular rhythm.   No murmur heard. Pulmonary/Chest: Effort normal and breath sounds normal. No respiratory distress. She has no wheezes. She has no rales. She exhibits no tenderness.  Neurological: She is alert and oriented to person, place, and time.  Psychiatric: She has a normal mood and affect. Her behavior is normal. Judgment and thought content normal.          Assessment & Plan:

## 2014-02-03 NOTE — Assessment & Plan Note (Signed)
BP stable on current meds, continue same,obtain bmet.  

## 2014-02-03 NOTE — Assessment & Plan Note (Signed)
Stable, management per psychiatry.  

## 2014-02-03 NOTE — Assessment & Plan Note (Signed)
Plan lamisil x 12 weeks, return in 6 weeks for lft's

## 2014-02-03 NOTE — Patient Instructions (Addendum)
Schedule follow up with your eye doctor. Complete lab work prior to leaving. Start lamisil for your toenails.  You will need to call us to schedule liver function testing in 6 weeks at your new location.  Please schedule a follow up appointment in 3 months.

## 2014-02-03 NOTE — Assessment & Plan Note (Addendum)
Clinically stable on victoza. Obtain A1C. Pt will sched follow up eye exam, flu shot today.

## 2014-02-04 ENCOUNTER — Encounter: Payer: Self-pay | Admitting: Family

## 2014-03-18 ENCOUNTER — Other Ambulatory Visit (INDEPENDENT_AMBULATORY_CARE_PROVIDER_SITE_OTHER): Payer: 59

## 2014-03-18 DIAGNOSIS — B353 Tinea pedis: Secondary | ICD-10-CM

## 2014-03-18 LAB — HEPATIC FUNCTION PANEL
ALT: 29 U/L (ref 0–35)
AST: 18 U/L (ref 0–37)
Albumin: 3.5 g/dL (ref 3.5–5.2)
Alkaline Phosphatase: 58 U/L (ref 39–117)
Bilirubin, Direct: 0.1 mg/dL (ref 0.0–0.3)
Total Bilirubin: 0.5 mg/dL (ref 0.2–1.2)
Total Protein: 7.1 g/dL (ref 6.0–8.3)

## 2014-03-19 ENCOUNTER — Encounter: Payer: Self-pay | Admitting: Family

## 2014-04-27 NOTE — Progress Notes (Signed)
HPI: FU coronary disease. She has had a prior PCI of her diagonal. Her most recent catheterization in April of 2004 showed no obstructive disease, and her ejection fraction was normal. Her most recent Myoview was performed in Dec 2012. EF 77 and normal perfusion. Since I last saw her, the patient denies any dyspnea on exertion, orthopnea, PND, pedal edema, palpitations, syncope or chest pain.   Current Outpatient Prescriptions  Medication Sig Dispense Refill  . ARIPiprazole (ABILIFY) 5 MG tablet Take 5 mg by mouth daily.      Marland Kitchen aspirin 81 MG tablet Take 81 mg by mouth daily.      Marland Kitchen buPROPion (WELLBUTRIN XL) 150 MG 24 hr tablet Take 450 mg by mouth daily.      . Calcium Carbonate-Vitamin D (CALTRATE 600+D) 600-400 MG-UNIT per tablet Take 1 tablet by mouth 2 (two) times daily.    . Lancets (ONETOUCH ULTRASOFT) lancets TEST BLOOD SUGAR ONCE DAILY 100 each 0  . Liraglutide (VICTOZA) 18 MG/3ML SOPN inject 1.2 milligram subcutaneously every morning 6 mL 5  . metoprolol (LOPRESSOR) 50 MG tablet take 1 tablet by mouth twice a day 60 tablet 5  . olmesartan-hydrochlorothiazide (BENICAR HCT) 20-12.5 MG per tablet take 1 tablet by mouth once daily 45 tablet 5  . ONE TOUCH ULTRA TEST test strip TEST once daily 100 each 3  . pantoprazole (PROTONIX) 40 MG tablet take 1 tablet by mouth once daily 30 tablet 5  . RA PEN NEEDLES 31G X 8 MM MISC use as directed once daily 100 each 2  . rosuvastatin (CRESTOR) 20 MG tablet take 1 tablet by mouth once daily 30 tablet 5  . terbinafine (LAMISIL) 250 MG tablet Take 1 tablet (250 mg total) by mouth daily. 30 tablet 2   No current facility-administered medications for this visit.     Past Medical History  Diagnosis Date  . CAD (coronary artery disease)   . Hyperlipidemia   . Hypertension   . Obstructive sleep apnea (adult) (pediatric)   . Esophageal reflux   . Type II or unspecified type diabetes mellitus without mention of complication, not stated as  uncontrolled   . Depressive disorder, not elsewhere classified   . Allergic rhinitis, cause unspecified   . Personal history of goiter     childhood  . ASCUS on Pap smear     Past Surgical History  Procedure Laterality Date  . Coronary stent placement  2004  . Goiter removal  1968  . Tubal ligation  1985  . Colposcopy  2012    cervical biopsy (Dr. Benjie Karvonen)    History   Social History  . Marital Status: Married    Spouse Name: N/A    Number of Children: 3  . Years of Education: N/A   Occupational History  . 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     Social History Main Topics  . Smoking status: Former Smoker -- 46 years    Types: Cigarettes    Quit date: 09/23/2012  . Smokeless tobacco: Never Used     Comment: Uses vapor cigarette  . Alcohol Use: Yes     Comment: occasional  . Drug Use: Not on file  . Sexual Activity: Not on file   Other Topics Concern  . Not on file   Social History Narrative   Caffeine Use:  2 cups coffee daily   Regular exercise:  No  ROS: no fevers or chills, productive cough, hemoptysis, dysphasia, odynophagia, melena, hematochezia, dysuria, hematuria, rash, seizure activity, orthopnea, PND, pedal edema, claudication. Remaining systems are negative.  Physical Exam: Well-developed well-nourished in no acute distress.  Skin is warm and dry.  HEENT is normal.  Neck is supple.  Chest is clear to auscultation with normal expansion.  Cardiovascular exam is regular rate and rhythm.  Abdominal exam nontender or distended. No masses palpated. Extremities show no edema. neuro grossly intact  ECG Normal sinus rhythm at a rate of 69. No ST changes.

## 2014-04-28 ENCOUNTER — Encounter: Payer: Self-pay | Admitting: Cardiology

## 2014-04-29 ENCOUNTER — Encounter: Payer: Self-pay | Admitting: Cardiology

## 2014-04-29 ENCOUNTER — Ambulatory Visit (INDEPENDENT_AMBULATORY_CARE_PROVIDER_SITE_OTHER): Payer: 59 | Admitting: Cardiology

## 2014-04-29 VITALS — BP 130/80 | HR 69 | Ht 64.0 in | Wt 193.0 lb

## 2014-04-29 DIAGNOSIS — I2510 Atherosclerotic heart disease of native coronary artery without angina pectoris: Secondary | ICD-10-CM

## 2014-04-29 DIAGNOSIS — I10 Essential (primary) hypertension: Secondary | ICD-10-CM

## 2014-04-29 DIAGNOSIS — F17200 Nicotine dependence, unspecified, uncomplicated: Secondary | ICD-10-CM

## 2014-04-29 DIAGNOSIS — Z720 Tobacco use: Secondary | ICD-10-CM

## 2014-04-29 NOTE — Assessment & Plan Note (Signed)
Blood pressure controlled. Continue present medications. Check potassium and renal function. 

## 2014-04-29 NOTE — Assessment & Plan Note (Signed)
Continue aspirin and statin. Schedule exercise treadmill. 

## 2014-04-29 NOTE — Assessment & Plan Note (Signed)
Continue statin. Check lipids and liver. 

## 2014-04-29 NOTE — Assessment & Plan Note (Signed)
She has not smoked now for over 2 years.

## 2014-04-29 NOTE — Patient Instructions (Signed)
Your physician wants you to follow-up in: [name] [name] will receive a reminder letter in the mail two months in advance. If you don't receive a letter, please call our office to schedule the follow-up appointment.   Your physician recommends that you return for lab work WITH STRESS TEST  Your physician has requested that you have an exercise tolerance test. For further information please visit [email or web address]. Please also follow instruction sheet, as given.    Exercise Stress Electrocardiogram An exercise stress electrocardiogram is a test to check how blood flows to your heart. It is done to find areas of poor blood flow. You will need to walk on a treadmill for this test. The electrocardiogram will record your heartbeat when you are at rest and when you are exercising. BEFORE THE PROCEDURE  Do not have drinks with caffeine or foods with caffeine for 24 hours before the test, or as told by your doctor. This includes coffee, tea (even decaf tea), sodas, chocolate, and cocoa.  Follow your doctor's instructions about eating and drinking before the test.  Ask your doctor what medicines you should or should not take before the test. Take your medicines with water unless told by your doctor not to.  If you use an inhaler, bring it with you to the test.  Bring a snack to eat after the test.  Do not  smoke for 4 hours before the test.  Do not put lotions, powders, creams, or oils on your chest before the test.  Wear comfortable shoes and clothing. PROCEDURE  You will have patches put on your chest. Small areas of your chest may need to be shaved. Wires will be connected to the patches.  Your heart rate will be watched while you are resting and while you are exercising.  You will walk on the treadmill. The treadmill will slowly get faster to raise your heart rate.  The test will take about 1-2 hours. AFTER THE PROCEDURE  Your heart rate and blood pressure will be  watched after the test.  You may return to your normal diet, activities, and medicines or as told by your doctor. Document Released: 11/14/2007 Document Revised: 10/12/2013 Document Reviewed: 02/02/2013 [facility] Patient Information 2015 [place], [place]. This information is not intended to replace advice given to you by your health care provider. Make sure you discuss any questions you have with your health care provider.

## 2014-05-01 ENCOUNTER — Other Ambulatory Visit: Payer: Self-pay | Admitting: Family

## 2014-05-03 NOTE — Telephone Encounter (Signed)
Medication Detail      Disp Refills Start End     RA PEN NEEDLES 31G X 8 MM MISC 100 each 2 06/28/2013     Sig: use as directed once daily    E-Prescribing Status: Receipt confirmed by pharmacy (06/29/2013 8:30 AM EST)     Pharmacy    [id number] [place], [place] - [address]   Rx request to pharmacy/SLS

## 2014-05-04 ENCOUNTER — Encounter: Payer: Self-pay | Admitting: Family Medicine

## 2014-05-04 ENCOUNTER — Ambulatory Visit (INDEPENDENT_AMBULATORY_CARE_PROVIDER_SITE_OTHER): Payer: 59 | Admitting: Family Medicine

## 2014-05-04 VITALS — BP 132/80 | HR 69 | Temp 97.9°F | Ht 64.0 in | Wt 195.2 lb

## 2014-05-04 DIAGNOSIS — I10 Essential (primary) hypertension: Secondary | ICD-10-CM

## 2014-05-04 DIAGNOSIS — E1169 Type 2 diabetes mellitus with other specified complication: Secondary | ICD-10-CM

## 2014-05-04 DIAGNOSIS — E669 Obesity, unspecified: Secondary | ICD-10-CM

## 2014-05-04 DIAGNOSIS — E119 Type 2 diabetes mellitus without complications: Secondary | ICD-10-CM

## 2014-05-04 DIAGNOSIS — E785 Hyperlipidemia, unspecified: Secondary | ICD-10-CM

## 2014-05-04 DIAGNOSIS — B351 Tinea unguium: Secondary | ICD-10-CM

## 2014-05-04 DIAGNOSIS — K219 Gastro-esophageal reflux disease without esophagitis: Secondary | ICD-10-CM

## 2014-05-04 NOTE — Patient Instructions (Signed)
Consider a probiotic such as digestive Advantage or [facility]  Basic Carbohydrate Counting for Diabetes Mellitus Carbohydrate counting is a method for keeping track of the amount of carbohydrates you eat. Eating carbohydrates naturally increases the level of sugar (glucose) in your blood, so it is important for you to know the amount that is okay for you to have in every meal. Carbohydrate counting helps keep the level of glucose in your blood within normal limits. The amount of carbohydrates allowed is different for every person. A dietitian can help you calculate the amount that is right for you. Once you know the amount of carbohydrates you can have, you can count the carbohydrates in the foods you want to eat. Carbohydrates are found in the following foods:  Grains, such as breads and cereals.  Dried beans and soy products.  Starchy vegetables, such as potatoes, peas, and corn.  Fruit and fruit juices.  Milk and yogurt.  Sweets and snack foods, such as cake, cookies, candy, chips, soft drinks, and fruit drinks. CARBOHYDRATE COUNTING There are two ways to count the carbohydrates in your food. You can use either of the methods or a combination of both. Reading the "Nutrition Facts" on [place] The "Nutrition Facts" is an area that is included on the labels of almost all packaged food and beverages in the [place]. It includes the serving size of that food or beverage and information about the nutrients in each serving of the food, including the grams (g) of carbohydrate per serving.  Decide the number of servings of this food or beverage that you will be able to eat or drink. Multiply that number of servings by the number of grams of carbohydrate that is listed on the label for that serving. The total will be the amount of carbohydrates you will be having when you eat or drink this food or beverage. Learning Standard Serving Sizes of Food When you eat food that is not  packaged or does not include "Nutrition Facts" on the label, you need to measure the servings in order to count the amount of carbohydrates.A serving of most carbohydrate-rich foods contains about 15 g of carbohydrates. The following list includes serving sizes of carbohydrate-rich foods that provide 15 g ofcarbohydrate per serving:   1 slice of bread (1 oz) or 1 six-inch tortilla.    of a hamburger bun or English muffin.  4-6 crackers.   cup unsweetened dry cereal.    cup hot cereal.   cup rice or pasta.    cup mashed potatoes or  of a large baked potato.  1 cup fresh fruit or one small piece of fruit.    cup canned or frozen fruit or fruit juice.  1 cup milk.   cup plain fat-free yogurt or yogurt sweetened with artificial sweeteners.   cup cooked dried beans or starchy vegetable, such as peas, corn, or potatoes.  Decide the number of standard-size servings that you will eat. Multiply that number of servings by 15 (the grams of carbohydrates in that serving). For example, if you eat 2 cups of strawberries, you will have eaten 2 servings and 30 g of carbohydrates (2 servings x 15 g = 30 g). For foods such as soups and casseroles, in which more than one food is mixed in, you will need to count the carbohydrates in each food that is included. EXAMPLE OF CARBOHYDRATE COUNTING Sample Dinner  3 oz chicken breast.   cup of brown rice.   cup  of corn.  1 cup milk.   1 cup strawberries with sugar-free whipped topping.  Carbohydrate Calculation Step 1: Identify the foods that contain carbohydrates:   Rice.   Corn.   Milk.   Strawberries. Step 2:Calculate the number of servings eaten of each:   2 servings of rice.   1 serving of corn.   1 serving of milk.   1 serving of strawberries. Step 3: Multiply each of those number of servings by 15 g:   2 servings of rice x 15 g = 30 g.   1 serving of corn x 15 g = 15 g.   1 serving of milk x 15  g = 15 g.   1 serving of strawberries x 15 g = 15 g. Step 4: Add together all of the amounts to find the total grams of carbohydrates eaten: 30 g + 15 g + 15 g + 15 g = 75 g. Document Released: 05/28/2005 Document Revised: 10/12/2013 Document Reviewed: 04/24/2013 [facility] Patient Information 2015 [place], [place]. This information is not intended to replace advice given to you by your health care provider. Make sure you discuss any questions you have with your health care provider.

## 2014-05-04 NOTE — Progress Notes (Signed)
Pre visit review using our clinic review tool, if applicable. No additional management support is needed unless otherwise documented below in the visit note. 

## 2014-05-10 NOTE — Assessment & Plan Note (Signed)
Responding to Lamisil encouraged to soak in vinegar nightly

## 2014-05-10 NOTE — Assessment & Plan Note (Signed)
Well controlled, no changes to meds. Encouraged heart healthy diet such as the DASH diet and exercise as tolerated.  °

## 2014-05-10 NOTE — Assessment & Plan Note (Signed)
Avoid offending foods, start probiotics. Do not eat large meals in late evening and consider raising head of bed.  

## 2014-05-10 NOTE — Progress Notes (Signed)
[name] [name]  [id number] [birth year] 05/10/2014      Progress Note-Follow Up  Subjective  Chief Complaint  Chief Complaint  Patient presents with  . Follow-up    3 month    HPI  Patient is a 63 y.o. female in today for routine medical care. She is in today for follow-up. Is noting an improvement in her toenail fungus status post Lamisil. No recent illness. She has had increased stress and difficulty lately. Well controlled, no changes to meds. Encouraged heart healthy diet such as the DASH diet and exercise as tolerated.   Past Medical History  Diagnosis Date  . CAD (coronary artery disease)   . Hyperlipidemia   . Hypertension   . Obstructive sleep apnea (adult) (pediatric)   . Esophageal reflux   . Type II or unspecified type diabetes mellitus without mention of complication, not stated as uncontrolled   . Depressive disorder, not elsewhere classified   . Allergic rhinitis, cause unspecified   . Personal history of goiter     childhood  . ASCUS on Pap smear     Past Surgical History  Procedure Laterality Date  . Coronary stent placement  2004  . Goiter removal  1968  . Tubal ligation  1985  . Colposcopy  2012    cervical biopsy (Dr. Benjie Karvonen)    Family History  Problem Relation Age of Onset  . Cancer Mother     throat, cervical  . Diabetes Mother   . Stroke Mother   . Heart attack Mother   . Cancer Father     prostate  . Diabetes Sister   . Heart attack Sister   . Hypertension Sister   . Hyperlipidemia Sister   . Colon cancer Neg Hx     History   Social History  . Marital Status: Married    Spouse Name: N/A    Number of Children: 3  . Years of Education: N/A   Occupational History  . 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     Social History Main Topics  . Smoking status: Former Smoker -- 46 years    Types: Cigarettes    Quit date: 09/23/2012  . Smokeless tobacco: Never Used     Comment: Uses vapor cigarette  . Alcohol Use:  Yes     Comment: occasional  . Drug Use: Not on file  . Sexual Activity: Not on file   Other Topics Concern  . Not on file   Social History Narrative   Caffeine Use:  2 cups coffee daily   Regular exercise:  No          Current Outpatient Prescriptions on File Prior to Visit  Medication Sig Dispense Refill  . ARIPiprazole (ABILIFY) 5 MG tablet Take 5 mg by mouth daily.      Marland Kitchen aspirin 81 MG tablet Take 81 mg by mouth daily.      . B-D ULTRAFINE III SHORT PEN 31G X 8 MM MISC use as directed once daily 100 each 2  . buPROPion (WELLBUTRIN XL) 150 MG 24 hr tablet Take 450 mg by mouth daily.      . Calcium Carbonate-Vitamin D (CALTRATE 600+D) 600-400 MG-UNIT per tablet Take 1 tablet by mouth 2 (two) times daily.    . Lancets (ONETOUCH ULTRASOFT) lancets TEST BLOOD SUGAR ONCE DAILY 100 each 0  . Liraglutide (VICTOZA) 18 MG/3ML SOPN inject 1.2 milligram subcutaneously every morning 6 mL 5  . metoprolol (  LOPRESSOR) 50 MG tablet take 1 tablet by mouth twice a day 60 tablet 5  . olmesartan-hydrochlorothiazide (BENICAR HCT) 20-12.5 MG per tablet take 1 tablet by mouth once daily 45 tablet 5  . ONE TOUCH ULTRA TEST test strip TEST once daily 100 each 3  . pantoprazole (PROTONIX) 40 MG tablet take 1 tablet by mouth once daily 30 tablet 5  . rosuvastatin (CRESTOR) 20 MG tablet take 1 tablet by mouth once daily 30 tablet 5  . terbinafine (LAMISIL) 250 MG tablet Take 1 tablet (250 mg total) by mouth daily. 30 tablet 2   No current facility-administered medications on file prior to visit.    No Known Allergies  Review of Systems  Review of Systems  Constitutional: Negative for fever and malaise/fatigue.  HENT: Negative for congestion.   Eyes: Negative for discharge.  Respiratory: Negative for shortness of breath.   Cardiovascular: Negative for chest pain, palpitations and leg swelling.  Gastrointestinal: Negative for nausea, abdominal pain and diarrhea.  Genitourinary: Negative for  dysuria.  Musculoskeletal: Negative for falls.  Skin: Negative for rash.  Neurological: Negative for loss of consciousness and headaches.  Endo/Heme/Allergies: Negative for polydipsia.  Psychiatric/Behavioral: Negative for depression and suicidal ideas. The patient is not nervous/anxious and does not have insomnia.     Objective  BP 132/80 mmHg  Pulse 69  Temp(Src) 97.9 F (36.6 C) (Oral)  Ht 5\' 4"  (1.626 m)  Wt 195 lb 3.2 oz (88.542 kg)  BMI 33.49 kg/m2  SpO2 97%  LMP  (Exact Date)  Physical Exam  Physical Exam  Constitutional: She is oriented to person, place, and time and well-developed, well-nourished, and in no distress. No distress.  HENT:  Head: Normocephalic and atraumatic.  Eyes: Conjunctivae are normal.  Neck: Neck supple. No thyromegaly present.  Cardiovascular: Normal rate, regular rhythm and normal heart sounds.   No murmur heard. Pulmonary/Chest: Effort normal and breath sounds normal. She has no wheezes.  Abdominal: She exhibits no distension and no mass.  Musculoskeletal: She exhibits no edema.  Lymphadenopathy:    She has no cervical adenopathy.  Neurological: She is alert and oriented to person, place, and time.  Skin: Skin is warm and dry. No rash noted. She is not diaphoretic.  Psychiatric: Memory, affect and judgment normal.    Lab Results  Component Value Date   TSH 0.672 09/22/2012   Lab Results  Component Value Date   WBC 4.9 05/09/2011   HGB 14.3 05/09/2011   HCT 43.3 05/09/2011   MCV 88.5 05/09/2011   PLT 185 05/09/2011   Lab Results  Component Value Date   CREATININE 0.78 02/03/2014   BUN 13 02/03/2014   NA 141 02/03/2014   K 4.1 02/03/2014   CL 106 02/03/2014   CO2 27 02/03/2014   Lab Results  Component Value Date   ALT 29 03/18/2014   AST 18 03/18/2014   ALKPHOS 58 03/18/2014   BILITOT 0.5 03/18/2014   Lab Results  Component Value Date   CHOL 112 03/31/2013   Lab Results  Component Value Date   HDL 45 03/31/2013    Lab Results  Component Value Date   LDLCALC 42 03/31/2013   Lab Results  Component Value Date   TRIG 124 03/31/2013   Lab Results  Component Value Date   CHOLHDL 2.5 03/31/2013     Assessment & Plan  Essential hypertension, benign Well controlled, no changes to meds. Encouraged heart healthy diet such as the DASH diet and exercise as  tolerated.   GERD Avoid offending foods, start probiotics. Do not eat large meals in late evening and consider raising head of bed.   Diabetes mellitus type 2 in obese hgba1c acceptable, minimize simple carbs. Increase exercise as tolerated. Continue current meds  Onychomycosis Responding to Lamisil encouraged to soak in vinegar nightly

## 2014-05-10 NOTE — Assessment & Plan Note (Signed)
hgba1c acceptable, minimize simple carbs. Increase exercise as tolerated. Continue current meds 

## 2014-05-13 ENCOUNTER — Other Ambulatory Visit (INDEPENDENT_AMBULATORY_CARE_PROVIDER_SITE_OTHER): Payer: 59

## 2014-05-13 DIAGNOSIS — E119 Type 2 diabetes mellitus without complications: Secondary | ICD-10-CM

## 2014-05-13 LAB — LIPID PANEL
Cholesterol: 121 mg/dL (ref 0–200)
HDL: 42.1 mg/dL (ref >39.00)
LDL Cholesterol: 55 mg/dL (ref 0–99)
NonHDL: 78.9
Total CHOL/HDL Ratio: 3
Triglycerides: 118 mg/dL (ref 0.0–149.0)
VLDL: 23.6 mg/dL (ref 0.0–40.0)

## 2014-05-13 LAB — CBC WITH DIFFERENTIAL/PLATELET
Basophils Absolute: 0 10*3/uL (ref 0.0–0.1)
Basophils Relative: 0.4 % (ref 0.0–3.0)
Eosinophils Absolute: 0.2 10*3/uL (ref 0.0–0.7)
Eosinophils Relative: 3.7 % (ref 0.0–5.0)
HCT: 40.3 % (ref 36.0–46.0)
Hemoglobin: 13.2 g/dL (ref 12.0–15.0)
Lymphocytes Relative: 28.7 % (ref 12.0–46.0)
Lymphs Abs: 1.2 10*3/uL (ref 0.7–4.0)
MCHC: 32.9 g/dL (ref 30.0–36.0)
MCV: 87.1 fl (ref 78.0–100.0)
Monocytes Absolute: 0.3 10*3/uL (ref 0.1–1.0)
Monocytes Relative: 6.9 % (ref 3.0–12.0)
Neutro Abs: 2.6 10*3/uL (ref 1.4–7.7)
Neutrophils Relative %: 60.3 % (ref 43.0–77.0)
Platelets: 154 10*3/uL (ref 150.0–400.0)
RBC: 4.63 Mil/uL (ref 3.87–5.11)
RDW: 15.1 % (ref 11.5–15.5)
WBC: 4.2 10*3/uL (ref 4.0–10.5)

## 2014-05-13 LAB — HEPATIC FUNCTION PANEL
ALT: 38 U/L — ABNORMAL HIGH (ref 0–35)
AST: 25 U/L (ref 0–37)
Albumin: 4 g/dL (ref 3.5–5.2)
Alkaline Phosphatase: 62 U/L (ref 39–117)
Bilirubin, Direct: 0.1 mg/dL (ref 0.0–0.3)
Total Bilirubin: 0.5 mg/dL (ref 0.2–1.2)
Total Protein: 6.9 g/dL (ref 6.0–8.3)

## 2014-05-13 LAB — RENAL FUNCTION PANEL
Albumin: 4 g/dL (ref 3.5–5.2)
BUN: 13 mg/dL (ref 6–23)
CO2: 26 meq/L (ref 19–32)
Calcium: 8.8 mg/dL (ref 8.4–10.5)
Chloride: 104 meq/L (ref 96–112)
Creatinine, Ser: 0.9 mg/dL (ref 0.4–1.2)
GFR: 71.5 mL/min
Glucose, Bld: 141 mg/dL — ABNORMAL HIGH (ref 70–99)
Phosphorus: 2.6 mg/dL (ref 2.3–4.6)
Potassium: 3.5 meq/L (ref 3.5–5.1)
Sodium: 140 meq/L (ref 135–145)

## 2014-05-13 LAB — TSH: TSH: 0.68 u[IU]/mL (ref 0.35–4.50)

## 2014-05-13 LAB — HEMOGLOBIN A1C: Hgb A1c MFr Bld: 6.4 % (ref 4.6–6.5)

## 2014-05-20 ENCOUNTER — Telehealth (HOSPITAL_COMMUNITY): Payer: Self-pay

## 2014-05-20 NOTE — Telephone Encounter (Signed)
Encounter complete. 

## 2014-05-21 ENCOUNTER — Other Ambulatory Visit: Payer: Self-pay | Admitting: Family

## 2014-05-24 NOTE — Telephone Encounter (Signed)
Rx request to pharmacy/SLS  

## 2014-05-25 ENCOUNTER — Ambulatory Visit (HOSPITAL_COMMUNITY)
Admission: RE | Admit: 2014-05-25 | Discharge: 2014-05-25 | Disposition: A | Discharge status: Home / Self Care | Payer: 59 | Source: Ambulatory Visit | Attending: Cardiovascular Disease | Admitting: Cardiovascular Disease

## 2014-05-25 DIAGNOSIS — I2510 Atherosclerotic heart disease of native coronary artery without angina pectoris: Secondary | ICD-10-CM | POA: Diagnosis not present

## 2014-05-25 DIAGNOSIS — I10 Essential (primary) hypertension: Secondary | ICD-10-CM | POA: Diagnosis not present

## 2014-05-25 DIAGNOSIS — F17200 Nicotine dependence, unspecified, uncomplicated: Secondary | ICD-10-CM

## 2014-05-25 DIAGNOSIS — Z720 Tobacco use: Secondary | ICD-10-CM | POA: Insufficient documentation

## 2014-05-25 NOTE — Procedures (Signed)
Exercise Treadmill Test  Test  Exercise Tolerance Test Ordering MD: Kirk Ruths, MD  Interpreting MD:   Unique Test No: 1  Treadmill:  1  Indication for ETT: Known CAD, STENT AND PTCA  Contraindication to ETT: No   Stress Modality: exercise - treadmill  Cardiac Imaging Performed: non   Protocol: standard Bruce - maximal  Max BP:  184/105  Max MPHR (bpm):  156 85% MPR (bpm): 133  MPHR obtained (bpm): 130 % MPHR obtained: 83  Reached 85% MPHR (min:sec):  N/A Total Exercise Time (min-sec): 5:41  Workload in METS:  7.00 Borg Scale: Not recorded  Reason ETT Terminated:  patient's desire to stop-dyspnea    ST Segment Analysis At Rest: normal ST segments - no evidence of significant ST depression With Exercise: no evidence of significant ST depression  Other Information Arrhythmia:  No Angina during ETT:  absent (0) Quality of ETT:  non-diagnostic  ETT Interpretation:  borderline (indeterminate) with non-specific ST changes  Comments: The patient had an low exercise tolerance.  There was no chest pain.  There was an increased level of dyspnea.  There were no arrhythmias, a normal heart rate response and normal BP response.  There were borderline ST T wave changes and a normal heart rate recovery.  The patient did not achieve her target heart rate.  She did have some mild ST depression in leads II, III, aVF and V6 which did not meet criteria for ischemia.  However, this was a submaximal test.    Recommendations: Suggest further study with pharmacologic stress testing if clinically indicated.

## 2014-05-27 ENCOUNTER — Other Ambulatory Visit: Payer: Self-pay

## 2014-05-27 ENCOUNTER — Other Ambulatory Visit: Payer: Self-pay | Admitting: Family

## 2014-05-27 MED ORDER — GLUCOSE BLOOD VI STRP: ORAL_STRIP | Status: DC

## 2014-05-27 NOTE — Telephone Encounter (Signed)
Test strips refilled to [facility] in [place].

## 2014-05-27 NOTE — Telephone Encounter (Signed)
Caller name: Lilyian Relation to pt: self Call back number: [phone] Pharmacy: [facility] in [place]  Reason for call:   Patient requesting a refill of the test strips

## 2014-06-04 ENCOUNTER — Other Ambulatory Visit: Payer: Self-pay | Admitting: Family

## 2014-06-23 ENCOUNTER — Other Ambulatory Visit: Payer: Self-pay | Admitting: Family

## 2014-08-21 ENCOUNTER — Other Ambulatory Visit: Payer: Self-pay | Admitting: Family

## 2014-08-23 NOTE — Telephone Encounter (Signed)
Medication Detail      Disp Refills Start End     CRESTOR 20 MG tablet 30 tablet 2 05/24/2014     Sig: take 1 tablet by mouth once daily    E-Prescribing Status: Receipt confirmed by pharmacy (05/24/2014 8:29 AM EST)   Pharmacy    [id number] [place], [place] - [address]   Rx request to pharmacy/SLS

## 2014-09-20 ENCOUNTER — Other Ambulatory Visit: Payer: Self-pay | Admitting: Family

## 2014-09-21 NOTE — Telephone Encounter (Signed)
Refill sent per [facility] refill protocol/SLS  

## 2014-09-28 ENCOUNTER — Other Ambulatory Visit: Payer: Self-pay | Admitting: Family

## 2014-10-13 LAB — HM DIABETES EYE EXAM

## 2014-10-19 ENCOUNTER — Telehealth: Payer: Self-pay | Admitting: Family

## 2014-10-19 NOTE — Telephone Encounter (Signed)
Pre Visit letter sent  °

## 2014-10-27 ENCOUNTER — Telehealth: Payer: Self-pay | Admitting: *Deleted

## 2014-10-27 NOTE — Telephone Encounter (Signed)
Received handwritten report from [facility] dated 10/12/13. Spoke with office and was told that date should have been 10/13/14. They will re-send a copy with the corrected date.

## 2014-11-09 ENCOUNTER — Encounter: Payer: 59 | Admitting: Family Medicine

## 2014-11-10 ENCOUNTER — Other Ambulatory Visit: Payer: Self-pay

## 2014-11-10 DIAGNOSIS — Z1239 Encounter for other screening for malignant neoplasm of breast: Secondary | ICD-10-CM

## 2014-11-11 ENCOUNTER — Other Ambulatory Visit: Payer: Self-pay | Admitting: Family

## 2014-11-30 ENCOUNTER — Telehealth: Payer: Self-pay | Admitting: Family

## 2014-11-30 NOTE — Telephone Encounter (Signed)
pre visit letter mailed 11/30/14 °

## 2014-12-01 ENCOUNTER — Other Ambulatory Visit: Payer: Self-pay | Admitting: Family

## 2014-12-15 ENCOUNTER — Telehealth: Payer: Self-pay | Admitting: Family

## 2014-12-15 ENCOUNTER — Ambulatory Visit (INDEPENDENT_AMBULATORY_CARE_PROVIDER_SITE_OTHER): Payer: 59 | Admitting: Family Medicine

## 2014-12-15 VITALS — BP 148/86 | HR 72 | Temp 97.7°F | Resp 18 | Ht 64.0 in | Wt 202.0 lb

## 2014-12-15 DIAGNOSIS — M7989 Other specified soft tissue disorders: Secondary | ICD-10-CM | POA: Diagnosis not present

## 2014-12-15 NOTE — Patient Instructions (Signed)
We will send you for an ultrasound in the morning to make sure you do not have a blood clot in your calf causing your swelling If you do have a clot we will start you on a blood thinner medication If you do not have a clot we will discuss what to do next- in that case your problem is likely "leaky veins" which we cannot cure but can mange to minimize symptoms If you were to develop any shortness of breath overnight go to the ER!

## 2014-12-15 NOTE — Progress Notes (Addendum)
[facility] [address] [place] [address] [phone]  Date:  12/15/2014   Name:  [name] [name]   DOB:  June 17, 1950   MRN:  [id number]  PCP:  Nance Pear., NP    Chief Complaint: Foot Swelling   History of Present Illness:  [name] [name] is a 64 y.o. very pleasant female patient who presents with the following:  She notes swelling of her left foot and ankle for about one week. She has had some pain in her back of her left knee but this is not new.  She has pain when she walks sometimes but not always She did recently travel to [place] by car- however she already had the swelling prior to this trip. They drove home yesterday- all in one day, approx 12 hours.  This did not make her sx worse No SOB or chest pain No history of DVT or PE. She is a smoker   She notes little to no swelling on the right.  The left is better in the am but not complelty down to normal, swells more as the day goes on   Patient Active Problem List   Diagnosis Date Noted  . Onychomycosis 02/03/2014  . Urinary incontinence 10/29/2013  . Cerumen impaction 10/29/2013  . Tinea pedis 07/08/2013  . Urinary urgency 04/02/2013  . Acute upper respiratory infections of unspecified site 04/02/2013  . Weight gain 09/28/2012  . Allergic rhinitis 09/28/2012  . Intertrigo 05/28/2012  . Left shoulder pain 03/05/2012  . Microalbuminuria 11/29/2011  . Osteopenia 07/13/2011  . Special screening for malignant neoplasms, colon 05/30/2011  . Internal and external hemorrhoids without complication 33/29/5188  . Benign neoplasm of colon 05/30/2011  . General medical examination 05/09/2011  . PURE HYPERCHOLESTEROLEMIA 01/12/2009  . Essential hypertension, benign 01/12/2009  . TOBACCO ABUSE 08/05/2008  . Diabetes mellitus type 2 in obese 01/11/2007  . HYPERLIPIDEMIA 01/11/2007  . DEPRESSION 01/11/2007  . OBSTRUCTIVE SLEEP APNEA 01/11/2007  . Coronary atherosclerosis 01/11/2007  . Seasonal and  perennial allergic rhinitis 01/11/2007  . GERD 01/11/2007    Past Medical History  Diagnosis Date  . CAD (coronary artery disease)   . Hyperlipidemia   . Hypertension   . Obstructive sleep apnea (adult) (pediatric)   . Esophageal reflux   . Type II or unspecified type diabetes mellitus without mention of complication, not stated as uncontrolled   . Depressive disorder, not elsewhere classified   . Allergic rhinitis, cause unspecified   . Personal history of goiter     childhood  . ASCUS on Pap smear     Past Surgical History  Procedure Laterality Date  . Coronary stent placement  2004  . Goiter removal  1968  . Tubal ligation  1985  . Colposcopy  2012    cervical biopsy (Dr. Benjie Karvonen)    History  Substance Use Topics  . Smoking status: Current Some Day Smoker -- 46 years    Types: E-cigarettes    Last Attempt to Quit: 09/23/2012  . Smokeless tobacco: Never Used     Comment: Uses vapor cigarette  . Alcohol Use: 0.0 oz/week    0 Standard drinks or equivalent per week     Comment: occasional    Family History  Problem Relation Age of Onset  . Cancer Mother     throat, cervical  . Diabetes Mother   . Stroke Mother   . Heart attack Mother   . Heart disease Mother   .  Hypertension Mother   . Cancer Father     prostate  . Diabetes Sister   . Heart attack Sister   . Hypertension Sister   . Hyperlipidemia Sister   . Colon cancer Neg Hx     No Known Allergies  Medication list has been reviewed and updated.  Current Outpatient Prescriptions on File Prior to Visit  Medication Sig Dispense Refill  . ARIPiprazole (ABILIFY) 5 MG tablet Take 5 mg by mouth daily.      Marland Kitchen aspirin 81 MG tablet Take 81 mg by mouth daily.      . B-D ULTRAFINE III SHORT PEN 31G X 8 MM MISC use as directed once daily 100 each 2  . BENICAR HCT 20-12.5 MG per tablet take 1 tablet by mouth once daily 45 tablet 1  . buPROPion (WELLBUTRIN XL) 150 MG 24 hr tablet Take 450 mg by mouth daily.      .  CRESTOR 20 MG tablet take 1 tablet by mouth once daily 30 tablet 0  . glucose blood (ONE TOUCH ULTRA TEST) test strip TEST once daily 100 each 3  . Lancets (ONETOUCH ULTRASOFT) lancets TEST BLOOD SUGAR ONCE DAILY 100 each 0  . metoprolol (LOPRESSOR) 50 MG tablet take 1 tablet by mouth twice a day 60 tablet 2  . pantoprazole (PROTONIX) 40 MG tablet take 1 tablet by mouth once daily 30 tablet 5  . VICTOZA 18 MG/3ML SOPN inject 1.2 milliliters subcutaneously every morning 6 mL 5  . Calcium Carbonate-Vitamin D (CALTRATE 600+D) 600-400 MG-UNIT per tablet Take 1 tablet by mouth 2 (two) times daily.    Marland Kitchen terbinafine (LAMISIL) 250 MG tablet Take 1 tablet (250 mg total) by mouth daily. 30 tablet 2   No current facility-administered medications on file prior to visit.    Review of Systems:  As per HPI- otherwise negative.   Physical Examination: Filed Vitals:   12/15/14 1917  BP: 148/86  Pulse: 72  Temp: 97.7 F (36.5 C)  Resp: 18   Filed Vitals:   12/15/14 1917  Height: 5\' 4"  (1.626 m)  Weight: 202 lb (91.627 kg)   Body mass index is 34.66 kg/(m^2). Ideal Body Weight: Weight in (lb) to have BMI = 25: 145.3  GEN: WDWN, NAD, Non-toxic, A & O x 3, overweight, looks well HEENT: Atraumatic, Normocephalic. Neck supple. No masses, No LAD. Ears and Nose: No external deformity. CV: RRR, No M/G/R. No JVD. No thrill. No extra heart sounds. PULM: CTA B, no wheezes, crackles, rhonchi. No retractions. No resp. distress. No accessory muscle use. ABD: S, NT, ND, +BS. No rebound. No HSM. EXTR: No c/c/e NEURO Normal gait.  PSYCH: Normally interactive. Conversant. Not depressed or anxious appearing.  Calm demeanor.  Left LE: she has pitting edema in the ankle and dorsal foot.  Minimal edema in the anterior tibia.  She does not have any tenderness or cords in her calves.  She has normal cap refill of the toes and normal DP pulse  Assessment and Plan: Swelling of left lower extremity - Plan:  Comprehensive metabolic panel  Consider DVT, venous insuf, arterial insuf, baker's cyst Offered to start her on xarelto if she would like in case she does have a DVT.  She declines but would like to have an [place] tomorrow Will schedule and [place] for tomorrow and make our next step based on the results  Signed Lamar Blinks, MD  Received [place] on 7/7, also labs.  Called and LMOM, will try her  again alter  Results for orders placed or performed in visit on 12/15/14  Comprehensive metabolic panel  Result Value Ref Range   Sodium 140 135 - 145 mEq/L   Potassium 3.5 3.5 - 5.3 mEq/L   Chloride 104 96 - 112 mEq/L   CO2 25 19 - 32 mEq/L   Glucose, Bld 82 70 - 99 mg/dL   BUN 15 6 - 23 mg/dL   Creat 0.70 0.50 - 1.10 mg/dL   Total Bilirubin 0.5 0.2 - 1.2 mg/dL   Alkaline Phosphatase 68 39 - 117 U/L   AST 35 0 - 37 U/L   ALT 61 (H) 0 - 35 U/L   Total Protein 6.9 6.0 - 8.3 g/dL   Albumin 4.1 3.5 - 5.2 g/dL   Calcium 10.0 8.4 - 10.5 mg/dL     CLINICAL DATA: Left lower extremity swelling for 1 week.  EXAM: LEFT LOWER EXTREMITY VENOUS DOPPLER ULTRASOUND  TECHNIQUE: Gray-scale sonography with graded compression, as well as color Doppler and duplex ultrasound, were performed to evaluate the deep venous system from the level of the common femoral vein through the popliteal and proximal calf veins. Spectral Doppler was utilized to evaluate flow at rest and with distal augmentation maneuvers.  COMPARISON: None.  FINDINGS: Normal compressibility, augmentation and color Doppler flow in the left common femoral vein, left femoral vein and left popliteal vein. The left saphenofemoral junction is patent. Visualized deep left calf veins are patent. Visualized left great saphenous vein is patent.  Right common femoral vein is patent. The right saphenofemoral junction is patent.  IMPRESSION: Negative for deep vein thrombosis in the left lower extremity.  Called again 7/8- no answer, LMOM.   Will send letter

## 2014-12-15 NOTE — Telephone Encounter (Signed)
Patient Name: [name] [name]  DOB: [birth year]    Initial Comment Caller states her left foot is swollen and her ankle.    Nurse Assessment  Nurse: Thad Ranger RN, Denise Date/Time (Eastern Time): 12/15/2014 3:08:42 PM  Confirm and document reason for call. If symptomatic, describe symptoms. ---Caller states her left foot is swollen and her ankle. States it is the left ankle and foot are swollen but denies pain. Denies edema or pain of the LLE/calf area. No bruising and she does not recall injury. The foot/ankle have been swollen x 8 days and getting worse. Recent long distant travel.  Has the patient traveled out of the country within the last 30 days? ---Not Applicable  Does the patient require triage? ---Yes  Related visit to physician within the last 2 weeks? ---No  Does the PT have any chronic conditions? (i.e. diabetes, asthma, etc.) ---Yes  List chronic conditions. ---NIDDM, HTN, Cardiac Stent w/use of ASA only.     Guidelines    Guideline Title Affirmed Question Affirmed Notes  Ankle Swelling [1] Red area or streak [2] large (> 2 in. or 5 cm) Redness on 2nd and 3rd toe, left foot. Redness extends to the top of the foot but denies streaking. Based on RN clinical knowledge, advised to use ice pack on the foot and keep it elevated above level of heart.   Final Disposition User   See Physician within 4 Hours (or PCP triage) Thad Ranger, RN, Langley Gauss    Comments  Pt will be seen at the [facility] this eve as documented r/t possible Cellulitis vs DVT. Pt states she has an appt on 12/16/14 with Dr Vivien Rossetti as made by the office staff today. Advised to follow MD inst as given post visit today and keep the appt as made for 12/16/14 if needed. Verb understanding.

## 2014-12-15 NOTE — Telephone Encounter (Signed)
Appointment tomorrow (12/16/14) at 4 pm with Dr. Charlett Blake noted.

## 2014-12-16 ENCOUNTER — Ambulatory Visit: Payer: 59 | Admitting: Family Medicine

## 2014-12-16 ENCOUNTER — Ambulatory Visit
Admission: RE | Admit: 2014-12-16 | Discharge: 2014-12-16 | Disposition: A | Discharge status: Home / Self Care | Payer: 59 | Source: Ambulatory Visit | Attending: Family Medicine | Admitting: Family Medicine

## 2014-12-16 DIAGNOSIS — M7989 Other specified soft tissue disorders: Secondary | ICD-10-CM

## 2014-12-16 LAB — COMPREHENSIVE METABOLIC PANEL
ALT: 61 U/L — ABNORMAL HIGH (ref 0–35)
AST: 35 U/L (ref 0–37)
Albumin: 4.1 g/dL (ref 3.5–5.2)
Alkaline Phosphatase: 68 U/L (ref 39–117)
BUN: 15 mg/dL (ref 6–23)
CO2: 25 mEq/L (ref 19–32)
Calcium: 10 mg/dL (ref 8.4–10.5)
Chloride: 104 mEq/L (ref 96–112)
Creat: 0.7 mg/dL (ref 0.50–1.10)
Glucose, Bld: 82 mg/dL (ref 70–99)
Potassium: 3.5 mEq/L (ref 3.5–5.3)
Sodium: 140 mEq/L (ref 135–145)
Total Bilirubin: 0.5 mg/dL (ref 0.2–1.2)
Total Protein: 6.9 g/dL (ref 6.0–8.3)

## 2014-12-17 ENCOUNTER — Encounter: Payer: Self-pay | Admitting: Family Medicine

## 2014-12-20 ENCOUNTER — Encounter: Payer: Self-pay | Admitting: Behavioral Health

## 2014-12-20 ENCOUNTER — Telehealth: Payer: Self-pay | Admitting: Family Medicine

## 2014-12-20 ENCOUNTER — Telehealth: Payer: Self-pay | Admitting: Behavioral Health

## 2014-12-20 NOTE — Addendum Note (Signed)
Addended by: Eduard Roux E on: 12/20/2014 02:46 PM   Modules accepted: Medications

## 2014-12-20 NOTE — Telephone Encounter (Signed)
Unable to reach patient at time of Pre-Visit Call.  Left message for patient to return call when available.    

## 2014-12-20 NOTE — Telephone Encounter (Signed)
Pre-Visit Call completed with patient and chart updated.   Pre-Visit Info documented in Specialty Comments under [employer].    

## 2014-12-20 NOTE — Telephone Encounter (Signed)
Pt was no show 12/16/14 4:00pm, acute appt, *pt LVM 12/16/14 at 1:41pm stating she went to Urgent Care*, not mailing letter, charge no show fee?

## 2014-12-20 NOTE — Telephone Encounter (Signed)
No charge. 

## 2014-12-21 ENCOUNTER — Encounter: Payer: Self-pay | Admitting: Family

## 2014-12-21 ENCOUNTER — Ambulatory Visit (INDEPENDENT_AMBULATORY_CARE_PROVIDER_SITE_OTHER): Payer: 59 | Admitting: Family

## 2014-12-21 ENCOUNTER — Ambulatory Visit (HOSPITAL_BASED_OUTPATIENT_CLINIC_OR_DEPARTMENT_OTHER)
Admission: RE | Admit: 2014-12-21 | Discharge: 2014-12-21 | Disposition: A | Discharge status: Home / Self Care | Payer: 59 | Source: Ambulatory Visit | Attending: Family | Admitting: Family

## 2014-12-21 VITALS — BP 120/84 | HR 67 | Temp 98.2°F | Resp 16 | Ht 63.0 in | Wt 201.4 lb

## 2014-12-21 DIAGNOSIS — I10 Essential (primary) hypertension: Secondary | ICD-10-CM

## 2014-12-21 DIAGNOSIS — E669 Obesity, unspecified: Secondary | ICD-10-CM

## 2014-12-21 DIAGNOSIS — E785 Hyperlipidemia, unspecified: Secondary | ICD-10-CM

## 2014-12-21 DIAGNOSIS — E119 Type 2 diabetes mellitus without complications: Secondary | ICD-10-CM

## 2014-12-21 DIAGNOSIS — Z0000 Encounter for general adult medical examination without abnormal findings: Secondary | ICD-10-CM

## 2014-12-21 DIAGNOSIS — M8580 Other specified disorders of bone density and structure, unspecified site: Secondary | ICD-10-CM | POA: Diagnosis not present

## 2014-12-21 DIAGNOSIS — Z1231 Encounter for screening mammogram for malignant neoplasm of breast: Secondary | ICD-10-CM | POA: Diagnosis present

## 2014-12-21 DIAGNOSIS — K219 Gastro-esophageal reflux disease without esophagitis: Secondary | ICD-10-CM | POA: Diagnosis not present

## 2014-12-21 DIAGNOSIS — E1169 Type 2 diabetes mellitus with other specified complication: Secondary | ICD-10-CM

## 2014-12-21 LAB — CBC WITH DIFFERENTIAL/PLATELET
Basophils Absolute: 0 10*3/uL (ref 0.0–0.1)
Basophils Relative: 0.5 % (ref 0.0–3.0)
Eosinophils Absolute: 0.1 10*3/uL (ref 0.0–0.7)
Eosinophils Relative: 2.6 % (ref 0.0–5.0)
HCT: 39.3 % (ref 36.0–46.0)
Hemoglobin: 13.4 g/dL (ref 12.0–15.0)
Lymphocytes Relative: 36.9 % (ref 12.0–46.0)
Lymphs Abs: 1.7 10*3/uL (ref 0.7–4.0)
MCHC: 34 g/dL (ref 30.0–36.0)
MCV: 86.6 fl (ref 78.0–100.0)
Monocytes Absolute: 0.2 10*3/uL (ref 0.1–1.0)
Monocytes Relative: 4.5 % (ref 3.0–12.0)
Neutro Abs: 2.6 10*3/uL (ref 1.4–7.7)
Neutrophils Relative %: 55.5 % (ref 43.0–77.0)
Platelets: 159 10*3/uL (ref 150.0–400.0)
RBC: 4.54 Mil/uL (ref 3.87–5.11)
RDW: 15.3 % (ref 11.5–15.5)
WBC: 4.6 10*3/uL (ref 4.0–10.5)

## 2014-12-21 LAB — URINALYSIS, ROUTINE W REFLEX MICROSCOPIC
Bilirubin Urine: NEGATIVE
Hgb urine dipstick: NEGATIVE
Ketones, ur: NEGATIVE
Nitrite: NEGATIVE
RBC / HPF: NONE SEEN (ref 0–?)
Specific Gravity, Urine: 1.015 (ref 1.000–1.030)
Total Protein, Urine: NEGATIVE
Urine Glucose: NEGATIVE
Urobilinogen, UA: 0.2 (ref 0.0–1.0)
pH: 7.5 (ref 5.0–8.0)

## 2014-12-21 LAB — BASIC METABOLIC PANEL
BUN: 13 mg/dL (ref 6–23)
CO2: 28 mEq/L (ref 19–32)
Calcium: 9.2 mg/dL (ref 8.4–10.5)
Chloride: 106 mEq/L (ref 96–112)
Creatinine, Ser: 0.74 mg/dL (ref 0.40–1.20)
GFR: 83.74 mL/min (ref >60.00)
Glucose, Bld: 115 mg/dL — ABNORMAL HIGH (ref 70–99)
Potassium: 3.7 mEq/L (ref 3.5–5.1)
Sodium: 141 mEq/L (ref 135–145)

## 2014-12-21 LAB — LIPID PANEL
Cholesterol: 108 mg/dL (ref 0–200)
HDL: 47.1 mg/dL (ref >39.00)
LDL Cholesterol: 40 mg/dL (ref 0–99)
NonHDL: 60.9
Total CHOL/HDL Ratio: 2
Triglycerides: 103 mg/dL (ref 0.0–149.0)
VLDL: 20.6 mg/dL (ref 0.0–40.0)

## 2014-12-21 LAB — MICROALBUMIN / CREATININE URINE RATIO
Creatinine,U: 107.3 mg/dL
Microalb Creat Ratio: 1.6 mg/g (ref 0.0–30.0)
Microalb, Ur: 1.7 mg/dL (ref 0.0–1.9)

## 2014-12-21 LAB — HEPATIC FUNCTION PANEL
ALT: 58 U/L — ABNORMAL HIGH (ref 0–35)
AST: 32 U/L (ref 0–37)
Albumin: 4.1 g/dL (ref 3.5–5.2)
Alkaline Phosphatase: 67 U/L (ref 39–117)
Bilirubin, Direct: 0.1 mg/dL (ref 0.0–0.3)
Total Bilirubin: 0.5 mg/dL (ref 0.2–1.2)
Total Protein: 6.9 g/dL (ref 6.0–8.3)

## 2014-12-21 LAB — TSH: TSH: 0.59 u[IU]/mL (ref 0.35–4.50)

## 2014-12-21 LAB — HEMOGLOBIN A1C: Hgb A1c MFr Bld: 6.4 % (ref 4.6–6.5)

## 2014-12-21 MED ORDER — GLUCOSE BLOOD VI STRP: ORAL_STRIP | Status: DC

## 2014-12-21 MED ORDER — LIRAGLUTIDE 18 MG/3ML ~~LOC~~ SOPN: PEN_INJECTOR | SUBCUTANEOUS | Status: DC

## 2014-12-21 MED ORDER — PANTOPRAZOLE SODIUM 40 MG PO TBEC: 40.0000 mg | DELAYED_RELEASE_TABLET | Freq: Every day | ORAL | Status: DC

## 2014-12-21 MED ORDER — ROSUVASTATIN CALCIUM 20 MG PO TABS: 20.0000 mg | ORAL_TABLET | Freq: Every day | ORAL | Status: DC

## 2014-12-21 MED ORDER — METOPROLOL TARTRATE 50 MG PO TABS: 50.0000 mg | ORAL_TABLET | Freq: Two times a day (BID) | ORAL | Status: DC

## 2014-12-21 NOTE — Progress Notes (Signed)
Subjective:    Patient ID: [name] [name], female    DOB: [birth year], 64 y.o.   MRN: [id number]  HPI  Patient presents today for complete physical. She is also due for follow up on her other medical problems as she has not been seen since 11/15  Immunizations: tetanus up to date.   Diet: does not pay any attention to anything she eats.   Wt Readings from Last 3 Encounters:  12/21/14 201 lb 6.4 oz (91.354 kg)  12/15/14 202 lb (91.627 kg)  05/04/14 195 lb 3.2 oz (88.542 kg)   She reports meals consisting of items such as below:  Breakfast- coffee, egg mcmuffin 290 calories Lunch- sandwhich from home or take out (steak and cheese wrap kick back jacks, sometimes with a salad, sometimes fries.  Drinks diet pepsi, water Dinner-  Mocha latte on the way home from work (360 calories).  Eats dinner later- pizza, chicken wings,  Baked chicken, corn, homefries with cheese, watermelon  Has sugar free jello whipped cream before bed.  Eye exam: 5/16, normal per pt.   Tobacco abuse- vapes- and weaning down.   Exercise: none Colonoscopy: 2012- due for follow up in 2022 Dexa: 2013 Pap Smear: due will follow with GYN Mammogram: 2013- due  DM2-  Maintained on vicotza.   Lab Results  Component Value Date   HGBA1C 6.4 05/13/2014   HGBA1C 6.4* 02/03/2014   HGBA1C 5.9* 10/27/2013   Lab Results  Component Value Date   MICROALBUR 0.50 12/23/2012   LDLCALC 55 05/13/2014   CREATININE 0.70 12/15/2014     Depression- followed by psychiatry.  Reports depression is well controlled on current meds.  Hyperlipidemia-  maintianed on crestor.  Lab Results  Component Value Date   CHOL 121 05/13/2014   HDL 42.10 05/13/2014   LDLCALC 55 05/13/2014   LDLDIRECT 77.7 12/27/2009   TRIG 118.0 05/13/2014   CHOLHDL 3 05/13/2014   HTN- maintained on benicar hct, metoprolol.   BP Readings from Last 3 Encounters:  12/21/14 120/84  12/15/14 148/86  05/04/14 132/80   GERD- maintained on protonix. She  reports that if she misses a dose, symptoms return.  Review of Systems  Constitutional: Positive for unexpected weight change.  HENT: Negative for hearing loss and rhinorrhea.   Eyes: Negative for visual disturbance.  Respiratory: Negative for cough.   Cardiovascular:       Had LLE swelling, neg doppler, improved  Gastrointestinal: Positive for constipation. Negative for nausea and diarrhea.  Genitourinary: Negative for dysuria and frequency.  Musculoskeletal: Negative for myalgias.       Occasional R hip pain, worse since she has gained weight  Skin: Negative for rash.  Neurological: Negative for headaches.  Hematological: Negative for adenopathy.  Psychiatric/Behavioral: Negative for dysphoric mood and agitation.   Past Medical History  Diagnosis Date  . CAD (coronary artery disease)   . Hyperlipidemia   . Hypertension   . Obstructive sleep apnea (adult) (pediatric)   . Esophageal reflux   . Type II or unspecified type diabetes mellitus without mention of complication, not stated as uncontrolled   . Depressive disorder, not elsewhere classified   . Allergic rhinitis, cause unspecified   . Personal history of goiter     childhood  . ASCUS on Pap smear     History   Social History  . Marital Status: Married    Spouse Name: N/A  . Number of Children: 3  . Years of Education: N/A   Occupational History  .  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     Social History Main Topics  . Smoking status: Current Some Day Smoker -- 46 years    Types: E-cigarettes    Last Attempt to Quit: 09/23/2012  . Smokeless tobacco: Never Used     Comment: Uses vapor cigarette  . Alcohol Use: 0.0 oz/week    0 Standard drinks or equivalent per week     Comment: occasional  . Drug Use: No  . Sexual Activity: Not on file   Other Topics Concern  . Not on file   Social History Narrative   Caffeine Use:  2 cups coffee daily   Regular exercise:  No          Past  Surgical History  Procedure Laterality Date  . Coronary stent placement  2004  . Goiter removal  1968  . Tubal ligation  1985  . Colposcopy  2012    cervical biopsy (Dr. Benjie Karvonen)    Family History  Problem Relation Age of Onset  . Cancer Mother     throat, cervical  . Diabetes Mother   . Stroke Mother   . Heart attack Mother   . Heart disease Mother   . Hypertension Mother   . Cancer Father     prostate  . Diabetes Sister   . Heart attack Sister   . Hypertension Sister   . Hyperlipidemia Sister   . Colon cancer Neg Hx     No Known Allergies  Current Outpatient Prescriptions on File Prior to Visit  Medication Sig Dispense Refill  . ARIPiprazole (ABILIFY) 5 MG tablet Take 5 mg by mouth daily.      Marland Kitchen aspirin 81 MG tablet Take 81 mg by mouth daily.      . B-D ULTRAFINE III SHORT PEN 31G X 8 MM MISC use as directed once daily 100 each 2  . BENICAR HCT 20-12.5 MG per tablet take 1 tablet by mouth once daily (Patient taking differently: Take 1/2 tablet by mouth once a day.) 45 tablet 1  . buPROPion (WELLBUTRIN XL) 150 MG 24 hr tablet Take 450 mg by mouth daily.      . Calcium Carbonate-Vitamin D (CALTRATE 600+D) 600-400 MG-UNIT per tablet Take 1 tablet by mouth 2 (two) times daily.    . CRESTOR 20 MG tablet take 1 tablet by mouth once daily 30 tablet 0  . glucose blood (ONE TOUCH ULTRA TEST) test strip TEST once daily 100 each 3  . Lancets (ONETOUCH ULTRASOFT) lancets TEST BLOOD SUGAR ONCE DAILY 100 each 0  . metoprolol (LOPRESSOR) 50 MG tablet take 1 tablet by mouth twice a day 60 tablet 2  . pantoprazole (PROTONIX) 40 MG tablet take 1 tablet by mouth once daily 30 tablet 5  . VICTOZA 18 MG/3ML SOPN inject 1.2 milliliters subcutaneously every morning 6 mL 5   No current facility-administered medications on file prior to visit.    BP 120/84 mmHg  Pulse 67  Temp(Src) 98.2 F (36.8 C) (Oral)  Resp 16  Ht 5\' 3"  (1.6 m)  Wt 201 lb 6.4 oz (91.354 kg)  BMI 35.69 kg/m2  SpO2 96%   LMP  (Exact Date)        Objective:   Physical Exam  Physical Exam  Constitutional: She is oriented to person, place, and time. She appears well-developed and well-nourished. No distress.  HENT:  Head: Normocephalic and atraumatic.  Right Ear: Tympanic membrane and ear canal normal.  Left Ear: Tympanic membrane and ear canal normal.  Mouth/Throat: Oropharynx is clear and moist.  Eyes: Pupils are equal, round, and reactive to light. No scleral icterus.  Neck: Normal range of motion. No thyromegaly present.  Cardiovascular: Normal rate and regular rhythm.   No murmur heard. Pulmonary/Chest: Effort normal and breath sounds normal. No respiratory distress. He has no wheezes. She has no rales. She exhibits no tenderness.  Abdominal: Soft. Bowel sounds are normal. He exhibits no distension and no mass. There is no tenderness. There is no rebound and no guarding.  Musculoskeletal: She exhibits no edema.  Lymphadenopathy:    She has no cervical adenopathy.  Neurological: She is alert and oriented to person, place, and time.  She exhibits normal muscle tone. Coordination normal.  Skin: Skin is warm and dry. multiple skin tags around neck, multiple moles noted beneath breasts.   Psychiatric: She has a normal mood and affect. Her behavior is normal. Judgment and thought content normal.  Breasts: Examined lying Right: Without masses, retractions, discharge or axillary adenopathy. (bilateral nipple inversion) Left: Without masses, retractions, discharge or axillary adenopathy.  Pelvic: Deferred          Assessment & Plan:         Assessment & Plan:

## 2014-12-21 NOTE — Assessment & Plan Note (Signed)
Discussed healthy dietary changes, importance of regular exercise, tobacco cessation. Refer for dexa, mammogram.

## 2014-12-21 NOTE — Assessment & Plan Note (Signed)
Stable on PPI. We discussed GERD diet.

## 2014-12-21 NOTE — Assessment & Plan Note (Signed)
Clinically stable, continue victoza, obtain a1c, urine microalbumin.

## 2014-12-21 NOTE — Assessment & Plan Note (Signed)
BP stable on current meds. Continue same.  

## 2014-12-21 NOTE — Assessment & Plan Note (Signed)
Stable on statin, continue same.  

## 2014-12-21 NOTE — Progress Notes (Signed)
Pre visit review using our clinic review tool, if applicable. No additional management support is needed unless otherwise documented below in the visit note. 

## 2014-12-21 NOTE — Patient Instructions (Addendum)
Work on [employer], exericise ane weight loss. Complete lab work prior to leaving, schedule mammogram on the first floor, we will call you re: bone density. Follow up in 4 months.      Gastroesophageal Reflux Disease, Adult Gastroesophageal reflux disease (GERD) happens when acid from your stomach flows up into the esophagus. When acid comes in contact with the esophagus, the acid causes soreness (inflammation) in the esophagus. Over time, GERD may create small holes (ulcers) in the lining of the esophagus. CAUSES   Increased body weight. This puts pressure on the stomach, making acid rise from the stomach into the esophagus.  Smoking. This increases acid production in the stomach.  Drinking alcohol. This causes decreased pressure in the lower esophageal sphincter (valve or ring of muscle between the esophagus and stomach), allowing acid from the stomach into the esophagus.  Late evening meals and a full stomach. This increases pressure and acid production in the stomach.  A malformed lower esophageal sphincter. Sometimes, no cause is found. SYMPTOMS   Burning pain in the lower part of the mid-chest behind the breastbone and in the mid-stomach area. This may occur twice a week or more often.  Trouble swallowing.  Sore throat.  Dry cough.  Asthma-like symptoms including chest tightness, shortness of breath, or wheezing. DIAGNOSIS  Your caregiver may be able to diagnose GERD based on your symptoms. In some cases, X-rays and other tests may be done to check for complications or to check the condition of your stomach and esophagus. TREATMENT  Your caregiver may recommend over-the-counter or prescription medicines to help decrease acid production. Ask your caregiver before starting or adding any new medicines.  HOME CARE INSTRUCTIONS   Change the factors that you can control. Ask your caregiver for guidance concerning weight loss, quitting smoking, and alcohol  consumption.  Avoid foods and drinks that make your symptoms worse, such as:  Caffeine or alcoholic drinks.  Chocolate.  Peppermint or mint flavorings.  Garlic and onions.  Spicy foods.  Citrus fruits, such as oranges, lemons, or limes.  Tomato-based foods such as sauce, chili, salsa, and pizza.  Fried and fatty foods.  Avoid lying down for the 3 hours prior to your bedtime or prior to taking a nap.  Eat small, frequent meals instead of large meals.  Wear loose-fitting clothing. Do not wear anything tight around your waist that causes pressure on your stomach.  Raise the head of your bed 6 to 8 inches with wood blocks to help you sleep. Extra pillows will not help.  Only take over-the-counter or prescription medicines for pain, discomfort, or fever as directed by your caregiver.  Do not take aspirin, ibuprofen, or other nonsteroidal anti-inflammatory drugs (NSAIDs). SEEK IMMEDIATE MEDICAL CARE IF:   You have pain in your arms, neck, jaw, teeth, or back.  Your pain increases or changes in intensity or duration.  You develop nausea, vomiting, or sweating (diaphoresis).  You develop shortness of breath, or you faint.  Your vomit is green, yellow, black, or looks like coffee grounds or blood.  Your stool is red, bloody, or black. These symptoms could be signs of other problems, such as heart disease, gastric bleeding, or esophageal bleeding. MAKE SURE YOU:   Understand these instructions.  Will watch your condition.  Will get help right away if you are not doing well or get worse. Document Released: 03/07/2005 Document Revised: 08/20/2011 Document Reviewed: 12/15/2010 [facility] Patient Information 2015 [place], [place]. This information is not intended to replace advice  to you by your health care provider. Make sure you discuss any questions you have with your health care provider.  

## 2014-12-23 ENCOUNTER — Telehealth: Payer: Self-pay | Admitting: Family

## 2014-12-23 DIAGNOSIS — R945 Abnormal results of liver function studies: Secondary | ICD-10-CM

## 2014-12-23 DIAGNOSIS — R7989 Other specified abnormal findings of blood chemistry: Secondary | ICD-10-CM

## 2014-12-23 NOTE — Telephone Encounter (Signed)
Pt returning call. Please call cell #.

## 2014-12-23 NOTE — Telephone Encounter (Signed)
Left message on cell# to return my call.

## 2014-12-23 NOTE — Telephone Encounter (Signed)
Spoke with pt and she voices understanding, pt scheduled for lab work 12-27-2014. Lab orders entered.

## 2014-12-23 NOTE — Telephone Encounter (Signed)
Sugar is well controlled.   Cholesterol looks good.  Liver function testing remains elevated.  Does she drink ETOH? How much/often. I would like her to complete abdominal US and labs as pended below.

## 2014-12-24 ENCOUNTER — Other Ambulatory Visit: Payer: Self-pay | Admitting: Family

## 2014-12-24 DIAGNOSIS — R945 Abnormal results of liver function studies: Secondary | ICD-10-CM

## 2014-12-24 DIAGNOSIS — R7989 Other specified abnormal findings of blood chemistry: Secondary | ICD-10-CM

## 2014-12-27 ENCOUNTER — Other Ambulatory Visit: Payer: 59

## 2014-12-29 ENCOUNTER — Inpatient Hospital Stay (HOSPITAL_BASED_OUTPATIENT_CLINIC_OR_DEPARTMENT_OTHER): Admission: RE | Admit: 2014-12-29 | Payer: 59 | Source: Ambulatory Visit

## 2014-12-29 ENCOUNTER — Ambulatory Visit (HOSPITAL_BASED_OUTPATIENT_CLINIC_OR_DEPARTMENT_OTHER)
Admission: RE | Admit: 2014-12-29 | Discharge: 2014-12-29 | Disposition: A | Discharge status: Home / Self Care | Payer: 59 | Source: Ambulatory Visit | Attending: Family | Admitting: Family

## 2014-12-29 ENCOUNTER — Other Ambulatory Visit (INDEPENDENT_AMBULATORY_CARE_PROVIDER_SITE_OTHER): Payer: 59

## 2014-12-29 DIAGNOSIS — K760 Fatty (change of) liver, not elsewhere classified: Secondary | ICD-10-CM | POA: Insufficient documentation

## 2014-12-29 DIAGNOSIS — R945 Abnormal results of liver function studies: Secondary | ICD-10-CM | POA: Diagnosis present

## 2014-12-29 DIAGNOSIS — N200 Calculus of kidney: Secondary | ICD-10-CM | POA: Insufficient documentation

## 2014-12-29 DIAGNOSIS — R7989 Other specified abnormal findings of blood chemistry: Secondary | ICD-10-CM

## 2014-12-29 LAB — HEPATITIS PANEL, ACUTE
HCV Ab: NEGATIVE
Hep A IgM: NONREACTIVE
Hep B C IgM: NONREACTIVE
Hepatitis B Surface Ag: NEGATIVE

## 2014-12-29 LAB — FERRITIN: Ferritin: 37.3 ng/mL (ref 10.0–291.0)

## 2014-12-31 ENCOUNTER — Telehealth: Payer: Self-pay | Admitting: Family

## 2014-12-31 ENCOUNTER — Encounter: Payer: Self-pay | Admitting: Family

## 2014-12-31 NOTE — Telephone Encounter (Signed)
Notified pt and she voices understanding. 

## 2014-12-31 NOTE — Telephone Encounter (Signed)
US shows fatty liver. This is most likely cause for elevated LFT.  Advise low fat/low cholesterol diet, exercise, weight loss.

## 2014-12-31 NOTE — Telephone Encounter (Signed)
Caller name:[name] Relationship to patient:SELF Can be reached:[phone] Pharmacy:  Reason for call:US RESULTS

## 2015-01-12 ENCOUNTER — Inpatient Hospital Stay: Admission: RE | Admit: 2015-01-12 | Payer: 59 | Source: Ambulatory Visit

## 2015-01-13 ENCOUNTER — Ambulatory Visit (INDEPENDENT_AMBULATORY_CARE_PROVIDER_SITE_OTHER)
Admission: RE | Admit: 2015-01-13 | Discharge: 2015-01-13 | Disposition: A | Discharge status: Home / Self Care | Payer: 59 | Source: Ambulatory Visit | Attending: Family | Admitting: Family

## 2015-01-13 DIAGNOSIS — M8580 Other specified disorders of bone density and structure, unspecified site: Secondary | ICD-10-CM | POA: Diagnosis not present

## 2015-01-14 ENCOUNTER — Encounter: Payer: Self-pay | Admitting: Family

## 2015-03-10 ENCOUNTER — Other Ambulatory Visit: Payer: Self-pay | Admitting: Family

## 2015-04-25 ENCOUNTER — Encounter (INDEPENDENT_AMBULATORY_CARE_PROVIDER_SITE_OTHER): Payer: Self-pay

## 2015-04-25 ENCOUNTER — Ambulatory Visit (INDEPENDENT_AMBULATORY_CARE_PROVIDER_SITE_OTHER): Payer: 59 | Admitting: Family

## 2015-04-25 VITALS — BP 144/82 | HR 63 | Temp 98.2°F | Resp 18 | Ht 63.0 in | Wt 201.2 lb

## 2015-04-25 DIAGNOSIS — E1310 Other specified diabetes mellitus with ketoacidosis without coma: Secondary | ICD-10-CM | POA: Diagnosis not present

## 2015-04-25 DIAGNOSIS — E1110 Type 2 diabetes mellitus with ketoacidosis without coma: Secondary | ICD-10-CM

## 2015-04-25 DIAGNOSIS — Z23 Encounter for immunization: Secondary | ICD-10-CM

## 2015-04-25 DIAGNOSIS — I10 Essential (primary) hypertension: Secondary | ICD-10-CM | POA: Diagnosis not present

## 2015-04-25 LAB — COMPREHENSIVE METABOLIC PANEL
ALT: 51 U/L — ABNORMAL HIGH (ref 0–35)
AST: 29 U/L (ref 0–37)
Albumin: 4 g/dL (ref 3.5–5.2)
Alkaline Phosphatase: 64 U/L (ref 39–117)
BUN: 14 mg/dL (ref 6–23)
CO2: 26 mEq/L (ref 19–32)
Calcium: 9.1 mg/dL (ref 8.4–10.5)
Chloride: 109 mEq/L (ref 96–112)
Creatinine, Ser: 0.72 mg/dL (ref 0.40–1.20)
GFR: 86.34 mL/min (ref >60.00)
Glucose, Bld: 148 mg/dL — ABNORMAL HIGH (ref 70–99)
Potassium: 3.8 mEq/L (ref 3.5–5.1)
Sodium: 144 mEq/L (ref 135–145)
Total Bilirubin: 0.5 mg/dL (ref 0.2–1.2)
Total Protein: 6.7 g/dL (ref 6.0–8.3)

## 2015-04-25 LAB — HEMOGLOBIN A1C: Hgb A1c MFr Bld: 6.5 % (ref 4.6–6.5)

## 2015-04-25 MED ORDER — INSULIN PEN NEEDLE 32G X 4 MM MISC: Status: DC

## 2015-04-25 NOTE — Addendum Note (Signed)
Addended by: Kelle Darting A on: 04/25/2015 10:56 AM   Modules accepted: Orders

## 2015-04-25 NOTE — Assessment & Plan Note (Signed)
BP up a bit today, has not yet taken AM meds.  Continue current meds.  Monitor.

## 2015-04-25 NOTE — Progress Notes (Signed)
Subjective:    Patient ID: [name] [name], female    DOB: [birth year], 64 y.o.   MRN: [id number]  HPI  DM2- reports fasting sugars have been elevated as high as 170. + compliance with victoza.    Lab Results  Component Value Date   HGBA1C 6.4 12/21/2014   HGBA1C 6.4 05/13/2014   HGBA1C 6.4* 02/03/2014   Lab Results  Component Value Date   MICROALBUR 1.7 12/21/2014   LDLCALC 40 12/21/2014   CREATININE 0.74 12/21/2014   HTN- Has not taken BP meds this AM BP Readings from Last 3 Encounters:  04/25/15 144/82  12/21/14 120/84  12/15/14 148/86    Hyperlipidemia- maintained on crestor.  Denies myalgia.    Headache-  Reports intermittent sharp pains in the left side of her head since Friday. Denies HA today.    Review of Systems See HPI  Past Medical History  Diagnosis Date  . CAD (coronary artery disease)   . Hyperlipidemia   . Hypertension   . Obstructive sleep apnea (adult) (pediatric)   . Esophageal reflux   . Type II or unspecified type diabetes mellitus without mention of complication, not stated as uncontrolled   . Depressive disorder, not elsewhere classified   . Allergic rhinitis, cause unspecified   . Personal history of goiter     childhood  . ASCUS on Pap smear     Social History   Social History  . Marital Status: Married    Spouse Name: N/A  . Number of Children: 3  . Years of Education: N/A   Occupational History  . 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     Social History Main Topics  . Smoking status: Current Some Day Smoker -- 46 years    Types: E-cigarettes    Last Attempt to Quit: 09/23/2012  . Smokeless tobacco: Never Used     Comment: Uses vapor cigarette  . Alcohol Use: 0.0 oz/week    0 Standard drinks or equivalent per week     Comment: occasional  . Drug Use: No  . Sexual Activity: Not on file   Other Topics Concern  . Not on file   Social History Narrative   Caffeine Use:  2 cups coffee daily   Regular  exercise:  No          Past Surgical History  Procedure Laterality Date  . Coronary stent placement  2004  . Goiter removal  1968  . Tubal ligation  1985  . Colposcopy  2012    cervical biopsy (Dr. Benjie Karvonen)    Family History  Problem Relation Age of Onset  . Cancer Mother     throat, cervical  . Diabetes Mother   . Stroke Mother   . Heart attack Mother   . Heart disease Mother   . Hypertension Mother   . Cancer Father     prostate  . Diabetes Sister   . Heart attack Sister   . Hypertension Sister   . Hyperlipidemia Sister   . Colon cancer Neg Hx     No Known Allergies  Current Outpatient Prescriptions on File Prior to Visit  Medication Sig Dispense Refill  . ARIPiprazole (ABILIFY) 5 MG tablet Take 5 mg by mouth daily.      Marland Kitchen aspirin 81 MG tablet Take 81 mg by mouth daily.      . B-D ULTRAFINE III SHORT PEN 31G X 8 MM MISC USE AS DIRECTED ONCE DAILY  100 each 2  . BENICAR HCT 20-12.5 MG per tablet take 1 tablet by mouth once daily (Patient taking differently: Take 1/2 tablet by mouth once a day.) 45 tablet 1  . buPROPion (WELLBUTRIN XL) 150 MG 24 hr tablet Take 450 mg by mouth daily.      . Calcium Carbonate-Vitamin D (CALTRATE 600+D) 600-400 MG-UNIT per tablet Take 1 tablet by mouth 2 (two) times daily.    Marland Kitchen glucose blood (ONE TOUCH ULTRA TEST) test strip TEST once daily 100 each 1  . Lancets (ONETOUCH ULTRASOFT) lancets TEST BLOOD SUGAR ONCE DAILY 100 each 0  . Liraglutide (VICTOZA) 18 MG/3ML SOPN inject 1.2 milliliters subcutaneously every morning  D/C PREVIOUS SCRIPTS FOR THIS MEDICATION 6 mL 5  . metoprolol (LOPRESSOR) 50 MG tablet Take 1 tablet (50 mg total) by mouth 2 (two) times daily. 60 tablet 5  . pantoprazole (PROTONIX) 40 MG tablet Take 1 tablet (40 mg total) by mouth daily. 30 tablet 5  . Probiotic Product (PROBIOTIC DAILY PO) Take 1 capsule by mouth daily.    . rosuvastatin (CRESTOR) 20 MG tablet Take 1 tablet (20 mg total) by mouth daily. 30 tablet 5   No  current facility-administered medications on file prior to visit.    Ht 5\' 3"  (1.6 m)  LMP  (Exact Date)       Objective:   Physical Exam  Constitutional: She appears well-developed and well-nourished.  HENT:  Head: Normocephalic and atraumatic.  Cardiovascular: Normal rate, regular rhythm and normal heart sounds.   No murmur heard. Pulmonary/Chest: Effort normal and breath sounds normal. No respiratory distress. She has no wheezes.  Musculoskeletal: She exhibits no edema.  Neurological: She is alert.  Skin: Skin is warm and dry.  Psychiatric: She has a normal mood and affect. Her behavior is normal. Judgment and thought content normal.          Assessment & Plan:  Headache- resolved. Advised pt to call if worsening or recurrent HA.  Prevnar and flu shot today.

## 2015-04-25 NOTE — Patient Instructions (Signed)
Please complete lab work prior to leaving. Let me know if you develop recurrent or worsening headache. You will be contacted about your referral to the nutritionist.

## 2015-04-25 NOTE — Progress Notes (Signed)
Pre visit review using our clinic review tool, if applicable. No additional management support is needed unless otherwise documented below in the visit note. 

## 2015-04-26 ENCOUNTER — Encounter: Payer: Self-pay | Admitting: Family

## 2015-04-26 DIAGNOSIS — K760 Fatty (change of) liver, not elsewhere classified: Secondary | ICD-10-CM

## 2015-04-26 HISTORY — DX: Fatty (change of) liver, not elsewhere classified: K76.0

## 2015-05-02 NOTE — Progress Notes (Signed)
HPI: FU coronary disease. She has had a prior PCI of her diagonal. Her most recent catheterization in April of 2004 showed no obstructive disease, and her ejection fraction was normal. Her most recent Myoview was performed in Dec 2012. EF 77 and normal perfusion. ETT 12/15 showed borderline changes in inferior leads. Since I last saw her, the patient denies any orthopnea, PND, pedal edema, palpitations, syncope or chest pain. Mild dyspnea on exertion.   Current Outpatient Prescriptions  Medication Sig Dispense Refill  . ARIPiprazole (ABILIFY) 5 MG tablet Take 5 mg by mouth daily.      Marland Kitchen aspirin 81 MG tablet Take 81 mg by mouth daily.      Marland Kitchen BENICAR HCT 20-12.5 MG per tablet take 1 tablet by mouth once daily (Patient taking differently: Take 1/2 tablet by mouth once a day.) 45 tablet 1  . buPROPion (WELLBUTRIN XL) 150 MG 24 hr tablet Take 450 mg by mouth daily.      . Calcium Carbonate-Vitamin D (CALTRATE 600+D) 600-400 MG-UNIT per tablet Take 1 tablet by mouth 2 (two) times daily.    Marland Kitchen glucose blood (ONE TOUCH ULTRA TEST) test strip TEST once daily 100 each 1  . Insulin Pen Needle 32G X 4 MM MISC Use once daily with insulin injection.  DX  E11.9 100 each 1  . Lancets (ONETOUCH ULTRASOFT) lancets TEST BLOOD SUGAR ONCE DAILY 100 each 0  . Liraglutide (VICTOZA) 18 MG/3ML SOPN inject 1.2 milliliters subcutaneously every morning  D/C PREVIOUS SCRIPTS FOR THIS MEDICATION 6 mL 5  . metoprolol (LOPRESSOR) 50 MG tablet Take 1 tablet (50 mg total) by mouth 2 (two) times daily. 60 tablet 5  . pantoprazole (PROTONIX) 40 MG tablet Take 1 tablet (40 mg total) by mouth daily. 30 tablet 5  . Probiotic Product (PROBIOTIC DAILY PO) Take 1 capsule by mouth daily.    . rosuvastatin (CRESTOR) 20 MG tablet Take 1 tablet (20 mg total) by mouth daily. 30 tablet 5   No current facility-administered medications for this visit.     Past Medical History  Diagnosis Date  . CAD (coronary artery disease)   .  Hyperlipidemia   . Hypertension   . Obstructive sleep apnea (adult) (pediatric)   . Esophageal reflux   . Type II or unspecified type diabetes mellitus without mention of complication, not stated as uncontrolled   . Depressive disorder, not elsewhere classified   . Allergic rhinitis, cause unspecified   . Personal history of goiter     childhood  . ASCUS on Pap smear   . Fatty liver 04/26/2015    Past Surgical History  Procedure Laterality Date  . Coronary stent placement  2004  . Goiter removal  1968  . Tubal ligation  1985  . Colposcopy  2012    cervical biopsy (Dr. Benjie Karvonen)    Social History   Social History  . Marital Status: Married    Spouse Name: N/A  . Number of Children: 3  . Years of Education: N/A   Occupational History  . 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     Social History Main Topics  . Smoking status: Current Some Day Smoker -- 46 years    Types: E-cigarettes    Last Attempt to Quit: 09/23/2012  . Smokeless tobacco: Never Used     Comment: Uses vapor cigarette  . Alcohol Use: 0.0 oz/week    0 Standard drinks or equivalent per  week     Comment: occasional  . Drug Use: No  . Sexual Activity: Not on file   Other Topics Concern  . Not on file   Social History Narrative   Caffeine Use:  2 cups coffee daily   Regular exercise:  No          ROS: no fevers or chills, productive cough, hemoptysis, dysphasia, odynophagia, melena, hematochezia, dysuria, hematuria, rash, seizure activity, orthopnea, PND, pedal edema, claudication. Remaining systems are negative.  Physical Exam: Well-developed obese in no acute distress.  Skin is warm and dry.  HEENT is normal.  Neck is supple.  Chest is clear to auscultation with normal expansion.  Cardiovascular exam is regular rate and rhythm.  Abdominal exam nontender or distended. No masses palpated. Extremities show no edema. neuro grossly intact  ECG Sinus rhythm at a rate of 66.  Occasional PVC. No ST changes.

## 2015-05-09 ENCOUNTER — Encounter: Payer: Self-pay | Admitting: Cardiology

## 2015-05-09 ENCOUNTER — Ambulatory Visit (INDEPENDENT_AMBULATORY_CARE_PROVIDER_SITE_OTHER): Payer: 59 | Admitting: Cardiology

## 2015-05-09 VITALS — BP 158/80 | HR 66 | Ht 63.0 in | Wt 200.0 lb

## 2015-05-09 DIAGNOSIS — I10 Essential (primary) hypertension: Secondary | ICD-10-CM | POA: Diagnosis not present

## 2015-05-09 DIAGNOSIS — I2510 Atherosclerotic heart disease of native coronary artery without angina pectoris: Secondary | ICD-10-CM | POA: Diagnosis not present

## 2015-05-09 DIAGNOSIS — E7800 Pure hypercholesterolemia, unspecified: Secondary | ICD-10-CM | POA: Diagnosis not present

## 2015-05-09 NOTE — Assessment & Plan Note (Signed)
Blood pressure elevated patient has not taken her medications today. She will continue present regimen and we will adjust based on follow-up readings.

## 2015-05-09 NOTE — Assessment & Plan Note (Signed)
Continue statin. 

## 2015-05-09 NOTE — Assessment & Plan Note (Signed)
Continue aspirin and statin. 

## 2015-05-09 NOTE — Patient Instructions (Addendum)
Your physician wants you to follow-up in: ONE YEAR WITH DR CRENSHAW You will receive a reminder letter in the mail two months in advance. If you don't receive a letter, please call our office to schedule the follow-up appointment.   If you need a refill on your cardiac medications before your next appointment, please call your pharmacy.  

## 2015-05-30 ENCOUNTER — Ambulatory Visit: Payer: 59 | Admitting: Dietician

## 2015-06-20 ENCOUNTER — Other Ambulatory Visit: Payer: Self-pay | Admitting: Family

## 2015-06-30 ENCOUNTER — Other Ambulatory Visit: Payer: Self-pay | Admitting: Family

## 2015-07-28 ENCOUNTER — Other Ambulatory Visit: Payer: Self-pay | Admitting: Family

## 2015-08-17 ENCOUNTER — Ambulatory Visit: Payer: 59 | Admitting: Family

## 2015-08-17 ENCOUNTER — Telehealth: Payer: Self-pay | Admitting: Family

## 2015-08-18 ENCOUNTER — Ambulatory Visit (INDEPENDENT_AMBULATORY_CARE_PROVIDER_SITE_OTHER): Payer: 59 | Admitting: Family

## 2015-08-18 ENCOUNTER — Encounter: Payer: Self-pay | Admitting: Family

## 2015-08-18 VITALS — BP 128/74 | HR 62 | Temp 98.0°F | Ht 63.0 in | Wt 198.1 lb

## 2015-08-18 DIAGNOSIS — E785 Hyperlipidemia, unspecified: Secondary | ICD-10-CM | POA: Diagnosis not present

## 2015-08-18 DIAGNOSIS — E119 Type 2 diabetes mellitus without complications: Secondary | ICD-10-CM

## 2015-08-18 DIAGNOSIS — E669 Obesity, unspecified: Secondary | ICD-10-CM

## 2015-08-18 DIAGNOSIS — G5602 Carpal tunnel syndrome, left upper limb: Secondary | ICD-10-CM | POA: Diagnosis not present

## 2015-08-18 DIAGNOSIS — I10 Essential (primary) hypertension: Secondary | ICD-10-CM

## 2015-08-18 DIAGNOSIS — E1169 Type 2 diabetes mellitus with other specified complication: Secondary | ICD-10-CM

## 2015-08-18 LAB — BASIC METABOLIC PANEL
BUN: 16 mg/dL (ref 6–23)
CO2: 24 mEq/L (ref 19–32)
Calcium: 9.2 mg/dL (ref 8.4–10.5)
Chloride: 109 mEq/L (ref 96–112)
Creatinine, Ser: 0.75 mg/dL (ref 0.40–1.20)
GFR: 82.28 mL/min (ref >60.00)
Glucose, Bld: 174 mg/dL — ABNORMAL HIGH (ref 70–99)
Potassium: 3.9 mEq/L (ref 3.5–5.1)
Sodium: 142 mEq/L (ref 135–145)

## 2015-08-18 LAB — LIPID PANEL
Cholesterol: 147 mg/dL (ref 0–200)
HDL: 49.3 mg/dL (ref >39.00)
LDL Cholesterol: 76 mg/dL (ref 0–99)
NonHDL: 97.81
Total CHOL/HDL Ratio: 3
Triglycerides: 110 mg/dL (ref 0.0–149.0)
VLDL: 22 mg/dL (ref 0.0–40.0)

## 2015-08-18 LAB — HEMOGLOBIN A1C: Hgb A1c MFr Bld: 6.9 % — ABNORMAL HIGH (ref 4.6–6.5)

## 2015-08-18 NOTE — Progress Notes (Signed)
Subjective:    Patient ID: [name] [name], female    DOB: 1950-08-03, 65 y.o.   MRN: [id number]  HPI  Pt here for follow up of diabetes. Hasn't been checking blood sugars. Denies polydipsia, polyuria.  Currently taking victoza 1.2mg  every morning. Lab Results  Component Value Date   HGBA1C 6.5 04/25/2015   HGBA1C 6.4 12/21/2014   HGBA1C 6.4 05/13/2014   Lab Results  Component Value Date   MICROALBUR 1.7 12/21/2014   LDLCALC 40 12/21/2014   CREATININE 0.72 04/25/2015   Hypertension-- currently taking metoprolol, benciar hct. Denies chest pain, sob or swelling. BP Readings from Last 3 Encounters:  08/18/15 128/74  05/09/15 158/80  04/25/15 144/82    Hyperlipidemia--Currently taking crestor, denies myalgias. Lab Results  Component Value Date   CHOL 108 12/21/2014   HDL 47.10 12/21/2014   LDLCALC 40 12/21/2014   LDLDIRECT 77.7 12/27/2009   TRIG 103.0 12/21/2014   CHOLHDL 2 12/21/2014   Weight-- didn't see nutrition due to insurance coverage. Pt will check with insurance regarding participating providers.  Thumb numbness-- left thumb numbness.    Review of Systems See HPI Past Medical History  Diagnosis Date  . CAD (coronary artery disease)   . Hyperlipidemia   . Hypertension   . Obstructive sleep apnea (adult) (pediatric)   . Esophageal reflux   . Type II or unspecified type diabetes mellitus without mention of complication, not stated as uncontrolled   . Depressive disorder, not elsewhere classified   . Allergic rhinitis, cause unspecified   . Personal history of goiter     childhood  . ASCUS on Pap smear   . Fatty liver 04/26/2015    Social History   Social History  . Marital Status: Married    Spouse Name: N/A  . Number of Children: 3  . Years of Education: N/A   Occupational History  . 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     Social History Main Topics  . Smoking status: Current Some Day Smoker -- 46 years    Types:  E-cigarettes    Last Attempt to Quit: 09/23/2012  . Smokeless tobacco: Never Used     Comment: Uses vapor cigarette  . Alcohol Use: 0.0 oz/week    0 Standard drinks or equivalent per week     Comment: occasional  . Drug Use: No  . Sexual Activity: Not on file   Other Topics Concern  . Not on file   Social History Narrative   Caffeine Use:  2 cups coffee daily   Regular exercise:  No          Past Surgical History  Procedure Laterality Date  . Coronary stent placement  2004  . Goiter removal  1968  . Tubal ligation  1985  . Colposcopy  2012    cervical biopsy (Dr. Benjie Karvonen)    Family History  Problem Relation Age of Onset  . Cancer Mother     throat, cervical  . Diabetes Mother   . Stroke Mother   . Heart attack Mother   . Heart disease Mother   . Hypertension Mother   . Cancer Father     prostate  . Diabetes Sister   . Heart attack Sister   . Hypertension Sister   . Hyperlipidemia Sister   . Colon cancer Neg Hx     No Known Allergies  Current Outpatient Prescriptions on File Prior to Visit  Medication Sig Dispense Refill  .  ARIPiprazole (ABILIFY) 5 MG tablet Take 5 mg by mouth daily.      Marland Kitchen aspirin 81 MG tablet Take 81 mg by mouth daily.      Marland Kitchen BENICAR HCT 20-12.5 MG tablet take 1 tablet by mouth once daily 45 tablet 1  . buPROPion (WELLBUTRIN XL) 150 MG 24 hr tablet Take 450 mg by mouth daily.      . Calcium Carbonate-Vitamin D (CALTRATE 600+D) 600-400 MG-UNIT per tablet Take 1 tablet by mouth 2 (two) times daily.    Marland Kitchen glucose blood (ONE TOUCH ULTRA TEST) test strip TEST once daily 100 each 1  . Insulin Pen Needle 32G X 4 MM MISC Use once daily with insulin injection.  DX  E11.9 100 each 1  . Lancets (ONETOUCH ULTRASOFT) lancets TEST BLOOD SUGAR ONCE DAILY 100 each 0  . metoprolol (LOPRESSOR) 50 MG tablet take 1 tablet by mouth twice a day 60 tablet 5  . pantoprazole (PROTONIX) 40 MG tablet Take 1 tablet (40 mg total) by mouth daily. 30 tablet 5  . Probiotic  Product (PROBIOTIC DAILY PO) Take 1 capsule by mouth daily.    . rosuvastatin (CRESTOR) 20 MG tablet take 1 tablet by mouth once daily 30 tablet 5  . VICTOZA 18 MG/3ML SOPN inject 1.2 milligram subcutaneously every morning 6 mL 2   No current facility-administered medications on file prior to visit.    BP 128/74 mmHg  Pulse 62  Temp(Src) 98 F (36.7 C) (Oral)  Ht 5\' 3"  (1.6 m)  Wt 198 lb 2 oz (89.869 kg)  BMI 35.11 kg/m2  SpO2 94%  LMP  (Exact Date)       Objective:   Physical Exam  Constitutional: She appears well-developed and well-nourished.  Cardiovascular: Normal rate, regular rhythm and normal heart sounds.   No murmur heard. Pulmonary/Chest: Effort normal and breath sounds normal. No respiratory distress. She has no wheezes.  Neurological:  Negative tinels, positive phalans-- left.  Psychiatric: She has a normal mood and affect. Her behavior is normal. Judgment and thought content normal.          Assessment & Plan:

## 2015-08-18 NOTE — Assessment & Plan Note (Signed)
Lab Results  Component Value Date   CHOL 108 12/21/2014   HDL 47.10 12/21/2014   LDLCALC 40 12/21/2014   LDLDIRECT 77.7 12/27/2009   TRIG 103.0 12/21/2014   CHOLHDL 2 12/21/2014   At goal, continue statin. l

## 2015-08-18 NOTE — Progress Notes (Signed)
Pre visit review using our clinic review tool, if applicable. No additional management support is needed unless otherwise documented below in the visit note. 

## 2015-08-18 NOTE — Patient Instructions (Addendum)
Please complete labs today. Purchase wrist brace to wear at night.  Please schedule follow up in 3 months.

## 2015-08-18 NOTE — Assessment & Plan Note (Signed)
Stable on current meds, continue same, obtain bmet.

## 2015-08-18 NOTE — Assessment & Plan Note (Signed)
Advised pt to try wearing wrist brace, try to avoid crocheting, this seems to have worsened her symptoms.

## 2015-08-18 NOTE — Assessment & Plan Note (Signed)
Clinically stable.  Obtain A1C.  

## 2015-08-21 ENCOUNTER — Other Ambulatory Visit: Payer: Self-pay | Admitting: Family

## 2015-09-15 NOTE — Telephone Encounter (Signed)
Error

## 2015-10-05 ENCOUNTER — Other Ambulatory Visit: Payer: Self-pay | Admitting: Family

## 2015-11-16 ENCOUNTER — Encounter: Payer: Self-pay | Admitting: Family

## 2015-11-16 ENCOUNTER — Ambulatory Visit (INDEPENDENT_AMBULATORY_CARE_PROVIDER_SITE_OTHER): Payer: 59 | Admitting: Family

## 2015-11-16 VITALS — HR 67 | Temp 98.4°F | Resp 18 | Ht 63.0 in | Wt 193.2 lb

## 2015-11-16 DIAGNOSIS — E1169 Type 2 diabetes mellitus with other specified complication: Secondary | ICD-10-CM

## 2015-11-16 DIAGNOSIS — E785 Hyperlipidemia, unspecified: Secondary | ICD-10-CM | POA: Diagnosis not present

## 2015-11-16 DIAGNOSIS — E669 Obesity, unspecified: Secondary | ICD-10-CM | POA: Diagnosis not present

## 2015-11-16 DIAGNOSIS — E119 Type 2 diabetes mellitus without complications: Secondary | ICD-10-CM | POA: Diagnosis not present

## 2015-11-16 DIAGNOSIS — I10 Essential (primary) hypertension: Secondary | ICD-10-CM | POA: Diagnosis not present

## 2015-11-16 DIAGNOSIS — Z0000 Encounter for general adult medical examination without abnormal findings: Secondary | ICD-10-CM

## 2015-11-16 LAB — BASIC METABOLIC PANEL
BUN: 17 mg/dL (ref 6–23)
CO2: 29 mEq/L (ref 19–32)
Calcium: 9.7 mg/dL (ref 8.4–10.5)
Chloride: 106 mEq/L (ref 96–112)
Creatinine, Ser: 0.77 mg/dL (ref 0.40–1.20)
GFR: 79.76 mL/min (ref >60.00)
Glucose, Bld: 122 mg/dL — ABNORMAL HIGH (ref 70–99)
Potassium: 3.7 mEq/L (ref 3.5–5.1)
Sodium: 142 mEq/L (ref 135–145)

## 2015-11-16 LAB — HEMOGLOBIN A1C: Hgb A1c MFr Bld: 6.1 % (ref 4.6–6.5)

## 2015-11-16 MED ORDER — LIRAGLUTIDE 18 MG/3ML ~~LOC~~ SOPN: PEN_INJECTOR | SUBCUTANEOUS | Status: DC

## 2015-11-16 NOTE — Progress Notes (Signed)
Pre visit review using our clinic review tool, if applicable. No additional management support is needed unless otherwise documented below in the visit note. 

## 2015-11-16 NOTE — Assessment & Plan Note (Signed)
Stable on current meds, continue same.  

## 2015-11-16 NOTE — Progress Notes (Signed)
Subjective:    Patient ID: [name] [name], female    DOB: July 13, 1950, 65 y.o.   MRN: [id number]  HPI  DM2- currently maintained on victoza 1.2.  Reports AM usually 130's, has had a high of 158. She recently joined [employer]. Now working hard with diet.   Lab Results  Component Value Date   HGBA1C 6.9* 08/18/2015   HGBA1C 6.5 04/25/2015   HGBA1C 6.4 12/21/2014   Lab Results  Component Value Date   MICROALBUR 1.7 12/21/2014   LDLCALC 76 08/18/2015   CREATININE 0.75 08/18/2015   2) HTN- on benicar HCT and metoprolol. Denies cp/sob or swelling.  BP Readings from Last 3 Encounters:  08/18/15 128/74  05/09/15 158/80  04/25/15 144/82   3) Hyperlipidemia- on crestor.  Denies myalgia.  Lab Results  Component Value Date   CHOL 147 08/18/2015   HDL 49.30 08/18/2015   LDLCALC 76 08/18/2015   LDLDIRECT 77.7 12/27/2009   TRIG 110.0 08/18/2015   CHOLHDL 3 08/18/2015      Review of Systems  See HPI  Past Medical History  Diagnosis Date  . CAD (coronary artery disease)   . Hyperlipidemia   . Hypertension   . Obstructive sleep apnea (adult) (pediatric)   . Esophageal reflux   . Type II or unspecified type diabetes mellitus without mention of complication, not stated as uncontrolled   . Depressive disorder, not elsewhere classified   . Allergic rhinitis, cause unspecified   . Personal history of goiter     childhood  . ASCUS on Pap smear   . Fatty liver 04/26/2015     Social History   Social History  . Marital Status: Married    Spouse Name: N/A  . Number of Children: 3  . Years of Education: N/A   Occupational History  . 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     Social History Main Topics  . Smoking status: Current Some Day Smoker -- 46 years    Types: E-cigarettes    Last Attempt to Quit: 09/23/2012  . Smokeless tobacco: Never Used     Comment: Uses vapor cigarette  . Alcohol Use: 0.0 oz/week    0 Standard drinks or equivalent per  week     Comment: occasional  . Drug Use: No  . Sexual Activity: Not on file   Other Topics Concern  . Not on file   Social History Narrative   Caffeine Use:  2 cups coffee daily   Regular exercise:  No          Past Surgical History  Procedure Laterality Date  . Coronary stent placement  2004  . Goiter removal  1968  . Tubal ligation  1985  . Colposcopy  2012    cervical biopsy (Dr. Benjie Karvonen)    Family History  Problem Relation Age of Onset  . Cancer Mother     throat, cervical  . Diabetes Mother   . Stroke Mother   . Heart attack Mother   . Heart disease Mother   . Hypertension Mother   . Cancer Father     prostate  . Diabetes Sister   . Heart attack Sister   . Hypertension Sister   . Hyperlipidemia Sister   . Colon cancer Neg Hx     No Known Allergies  Current Outpatient Prescriptions on File Prior to Visit  Medication Sig Dispense Refill  . ARIPiprazole (ABILIFY) 5 MG tablet Take 5 mg by  mouth daily.      Marland Kitchen aspirin 81 MG tablet Take 81 mg by mouth daily.      Marland Kitchen BENICAR HCT 20-12.5 MG tablet take 1 tablet by mouth once daily 45 tablet 1  . buPROPion (WELLBUTRIN XL) 150 MG 24 hr tablet Take 450 mg by mouth daily.      . Calcium Carbonate-Vitamin D (CALTRATE 600+D) 600-400 MG-UNIT per tablet Take 1 tablet by mouth 2 (two) times daily.    Marland Kitchen glucose blood (ONE TOUCH ULTRA TEST) test strip TEST once daily 100 each 1  . Insulin Pen Needle 32G X 4 MM MISC Use once daily with insulin injection.  DX  E11.9 100 each 1  . Lancets (ONETOUCH ULTRASOFT) lancets TEST BLOOD SUGAR ONCE DAILY 100 each 0  . metoprolol (LOPRESSOR) 50 MG tablet take 1 tablet by mouth twice a day 60 tablet 5  . pantoprazole (PROTONIX) 40 MG tablet take 1 tablet by mouth once daily 30 tablet 5  . Probiotic Product (PROBIOTIC DAILY PO) Take 1 capsule by mouth daily.    . rosuvastatin (CRESTOR) 20 MG tablet take 1 tablet by mouth once daily 30 tablet 5  . VICTOZA 18 MG/3ML SOPN inject 1.2 milligram  subcutaneously every morning 3 mL 2   No current facility-administered medications on file prior to visit.    Pulse 67  Temp(Src) 98.4 F (36.9 C) (Oral)  Resp 18  Ht 5\' 3"  (1.6 m)  Wt 193 lb 3.2 oz (87.635 kg)  BMI 34.23 kg/m2  SpO2 97%  LMP  (Exact Date)         Objective:   Physical Exam  Constitutional: She is oriented to person, place, and time. She appears well-developed and well-nourished.  HENT:  Head: Normocephalic and atraumatic.  Cardiovascular: Normal rate, regular rhythm and normal heart sounds.   No murmur heard. Pulmonary/Chest: Effort normal and breath sounds normal. No respiratory distress. She has no wheezes.  Musculoskeletal: She exhibits no edema.  Neurological: She is alert and oriented to person, place, and time.  Psychiatric: She has a normal mood and affect. Her behavior is normal. Judgment and thought content normal.          Assessment & Plan:

## 2015-11-16 NOTE — Assessment & Plan Note (Signed)
LDL at goal.  Continue statin.   

## 2015-11-16 NOTE — Assessment & Plan Note (Signed)
Obtain A1C if >7, plan to resume metformin which she has tolerated in the past. Continue victoza.

## 2015-11-16 NOTE — Patient Instructions (Signed)
Please complete lab work prior to leaving.  Continue current meds.   Schedule mammogram on the first floor.

## 2015-11-17 ENCOUNTER — Encounter: Payer: Self-pay | Admitting: Family

## 2015-12-27 ENCOUNTER — Ambulatory Visit (HOSPITAL_BASED_OUTPATIENT_CLINIC_OR_DEPARTMENT_OTHER)
Admission: RE | Admit: 2015-12-27 | Discharge: 2015-12-27 | Disposition: A | Discharge status: Home / Self Care | Payer: 59 | Source: Ambulatory Visit | Attending: Family | Admitting: Family

## 2015-12-27 DIAGNOSIS — Z0000 Encounter for general adult medical examination without abnormal findings: Secondary | ICD-10-CM

## 2015-12-27 DIAGNOSIS — Z1231 Encounter for screening mammogram for malignant neoplasm of breast: Secondary | ICD-10-CM | POA: Insufficient documentation

## 2015-12-29 ENCOUNTER — Other Ambulatory Visit: Payer: Self-pay | Admitting: Family

## 2015-12-29 DIAGNOSIS — R928 Other abnormal and inconclusive findings on diagnostic imaging of breast: Secondary | ICD-10-CM

## 2016-01-10 ENCOUNTER — Ambulatory Visit
Admission: RE | Admit: 2016-01-10 | Discharge: 2016-01-10 | Disposition: A | Discharge status: Home / Self Care | Payer: 59 | Source: Ambulatory Visit | Attending: Family | Admitting: Family

## 2016-01-10 DIAGNOSIS — R928 Other abnormal and inconclusive findings on diagnostic imaging of breast: Secondary | ICD-10-CM

## 2016-02-14 ENCOUNTER — Other Ambulatory Visit: Payer: Self-pay | Admitting: Family

## 2016-02-15 ENCOUNTER — Encounter: Payer: Self-pay | Admitting: Family

## 2016-02-15 ENCOUNTER — Ambulatory Visit (INDEPENDENT_AMBULATORY_CARE_PROVIDER_SITE_OTHER): Payer: 59 | Admitting: Family

## 2016-02-15 VITALS — BP 137/72 | HR 67 | Temp 98.2°F | Resp 17 | Ht 63.0 in | Wt 197.4 lb

## 2016-02-15 DIAGNOSIS — E785 Hyperlipidemia, unspecified: Secondary | ICD-10-CM | POA: Diagnosis not present

## 2016-02-15 DIAGNOSIS — I10 Essential (primary) hypertension: Secondary | ICD-10-CM | POA: Diagnosis not present

## 2016-02-15 DIAGNOSIS — E119 Type 2 diabetes mellitus without complications: Secondary | ICD-10-CM | POA: Diagnosis not present

## 2016-02-15 DIAGNOSIS — Z23 Encounter for immunization: Secondary | ICD-10-CM

## 2016-02-15 DIAGNOSIS — B353 Tinea pedis: Secondary | ICD-10-CM

## 2016-02-15 DIAGNOSIS — K219 Gastro-esophageal reflux disease without esophagitis: Secondary | ICD-10-CM

## 2016-02-15 DIAGNOSIS — E1169 Type 2 diabetes mellitus with other specified complication: Secondary | ICD-10-CM

## 2016-02-15 DIAGNOSIS — E669 Obesity, unspecified: Secondary | ICD-10-CM

## 2016-02-15 LAB — HEMOGLOBIN A1C: Hgb A1c MFr Bld: 6.2 % (ref 4.6–6.5)

## 2016-02-15 MED ORDER — METOPROLOL TARTRATE 50 MG PO TABS: 50.0000 mg | ORAL_TABLET | Freq: Two times a day (BID) | ORAL | 5 refills | Status: DC

## 2016-02-15 NOTE — Progress Notes (Signed)
Pre visit review using our clinic review tool, if applicable. No additional management support is needed unless otherwise documented below in the visit note. 

## 2016-02-15 NOTE — Telephone Encounter (Signed)
Pt request refill on 2 medications.   1. Crestor - pt only has one left   2. Metoprolol    Pharmacy: [id number] [place], [place] - [address]

## 2016-02-15 NOTE — Assessment & Plan Note (Signed)
Uncontrolled. Discussed gerd diet. Continue PPI. If symptoms do not improve with dietary modification, consider GI evaluation.

## 2016-02-15 NOTE — Assessment & Plan Note (Signed)
Stable on current medications. Continue same. 

## 2016-02-15 NOTE — Assessment & Plan Note (Signed)
Fasting sugars have been a little higher. Will obtain follow up A1C.

## 2016-02-15 NOTE — Progress Notes (Signed)
Subjective:    Patient ID: [name] [name], female    DOB: 1950/12/12, 65 y.o.   MRN: [id number]  HPI  [name] [name] is a 65 yr old female who presents today for follow up.  1) HTN- currently maintained on metoprolol and benicar HCT. Denies CP/SOB or swelling.  BP Readings from Last 3 Encounters:  02/15/16 137/72  08/18/15 128/74  05/09/15 (!) 158/80   2) DM2- on victoza.  Reports that her fasting sugars are running in the 160's. Lab Results  Component Value Date   HGBA1C 6.1 11/16/2015   HGBA1C 6.9 (H) 08/18/2015   HGBA1C 6.5 04/25/2015   Lab Results  Component Value Date   MICROALBUR 1.7 12/21/2014   LDLCALC 76 08/18/2015   CREATININE 0.77 11/16/2015   3) Hyperlipidemia-  Maintained on crestor.  Denies myalgia.  Lab Results  Component Value Date   CHOL 147 08/18/2015   HDL 49.30 08/18/2015   LDLCALC 76 08/18/2015   LDLDIRECT 77.7 12/27/2009   TRIG 110.0 08/18/2015   CHOLHDL 3 08/18/2015   4) GERD- continues protonix. Reports ongoing GERD symptoms. She eats dinner at 8:30/9 PM  She reports mass on the back of her head.  Has some associated itching.    Review of Systems See HPI  Past Medical History:  Diagnosis Date  . Allergic rhinitis, cause unspecified   . ASCUS on Pap smear   . CAD (coronary artery disease)   . Depressive disorder, not elsewhere classified   . Esophageal reflux   . Fatty liver 04/26/2015  . Hyperlipidemia   . Hypertension   . Obstructive sleep apnea (adult) (pediatric)   . Personal history of goiter    childhood  . Type II or unspecified type diabetes mellitus without mention of complication, not stated as uncontrolled      Social History   Social History  . Marital status: Married    Spouse name: N/A  . Number of children: 3  . Years of education: N/A   Occupational History  . 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  [employer]   Social History Main Topics  . Smoking status: Current Some Day Smoker   Years: 46.00    Types: E-cigarettes    Last attempt to quit: 09/23/2012  . Smokeless tobacco: Never Used     Comment: Uses vapor cigarette  . Alcohol use 0.0 oz/week     Comment: occasional  . Drug use: No  . Sexual activity: Not on file   Other Topics Concern  . Not on file   Social History Narrative   Caffeine Use:  2 cups coffee daily   Regular exercise:  No          Past Surgical History:  Procedure Laterality Date  . colposcopy  2012   cervical biopsy (Dr. Benjie Karvonen)  . CORONARY STENT PLACEMENT  2004  . goiter removal  1968  . TUBAL LIGATION  1985    Family History  Problem Relation Age of Onset  . Cancer Mother     throat, cervical  . Diabetes Mother   . Stroke Mother   . Heart attack Mother   . Heart disease Mother   . Hypertension Mother   . Cancer Father     prostate  . Diabetes Sister   . Heart attack Sister   . Hypertension Sister   . Hyperlipidemia Sister   . Colon cancer Neg Hx     No Known Allergies  Current Outpatient Prescriptions on File Prior to Visit  Medication Sig Dispense Refill  . ARIPiprazole (ABILIFY) 5 MG tablet Take 5 mg by mouth daily.      Marland Kitchen aspirin 81 MG tablet Take 81 mg by mouth daily.      Marland Kitchen BENICAR HCT 20-12.5 MG tablet take 1 tablet by mouth once daily 45 tablet 1  . buPROPion (WELLBUTRIN XL) 150 MG 24 hr tablet Take 450 mg by mouth daily.      . Calcium Carbonate-Vitamin D (CALTRATE 600+D) 600-400 MG-UNIT per tablet Take 1 tablet by mouth 2 (two) times daily.    Marland Kitchen glucose blood (ONE TOUCH ULTRA TEST) test strip TEST once daily 100 each 1  . Insulin Pen Needle 32G X 4 MM MISC Use once daily with insulin injection.  DX  E11.9 100 each 1  . Lancets (ONETOUCH ULTRASOFT) lancets TEST BLOOD SUGAR ONCE DAILY 100 each 0  . Liraglutide (VICTOZA) 18 MG/3ML SOPN inject 1.2 milligram subcutaneously every morning 3 mL 5  . metoprolol (LOPRESSOR) 50 MG tablet take 1 tablet by mouth twice a day 60 tablet 5  . Multiple Vitamins-Minerals  (MULTIVITAMIN ADULTS 50+ PO) Take 1 tablet by mouth daily.    . pantoprazole (PROTONIX) 40 MG tablet take 1 tablet by mouth once daily 30 tablet 5  . Probiotic Product (PROBIOTIC DAILY PO) Take 1 capsule by mouth daily.    . rosuvastatin (CRESTOR) 20 MG tablet take 1 tablet by mouth once daily 30 tablet 5   No current facility-administered medications on file prior to visit.     BP 137/72 (BP Location: Left Arm, Patient Position: Sitting, Cuff Size: Large)   Pulse 67   Temp 98.2 F (36.8 C) (Oral)   Resp 17   Ht 5\' 3"  (1.6 m)   Wt 197 lb 6.4 oz (89.5 kg)   LMP  (Exact Date)   SpO2 97%   BMI 34.97 kg/m       Objective:   Physical Exam  Constitutional: She appears well-developed and well-nourished.  Cardiovascular: Normal rate, regular rhythm and normal heart sounds.   No murmur heard. Pulmonary/Chest: Effort normal and breath sounds normal. No respiratory distress. She has no wheezes.  Skin: Skin is warm and dry.  + athlete's foot  Psychiatric: She has a normal mood and affect. Her behavior is normal. Judgment and thought content normal.          Assessment & Plan:  Flu shot today  Tinea Pedis- advised pt to apply otc lamisil spray bid.

## 2016-02-15 NOTE — Patient Instructions (Signed)
Please complete lab work prior to leaving. Work on reflux diet.   Food Choices for Gastroesophageal Reflux Disease, Adult When you have gastroesophageal reflux disease (GERD), the foods you eat and your eating habits are very important. Choosing the right foods can help ease the discomfort of GERD. WHAT GENERAL GUIDELINES DO I NEED TO FOLLOW?  Choose fruits, vegetables, whole grains, low-fat dairy products, and low-fat meat, fish, and poultry.  Limit fats such as oils, salad dressings, butter, nuts, and avocado.  Keep a food diary to identify foods that cause symptoms.  Avoid foods that cause reflux. These may be different for different people.  Eat frequent small meals instead of three large meals each day.  Eat your meals slowly, in a relaxed setting.  Limit fried foods.  Cook foods using methods other than frying.  Avoid drinking alcohol.  Avoid drinking large amounts of liquids with your meals.  Avoid bending over or lying down until 2-3 hours after eating. WHAT FOODS ARE NOT RECOMMENDED? The following are some foods and drinks that may worsen your symptoms: Vegetables Tomatoes. Tomato juice. Tomato and spaghetti sauce. Chili peppers. Onion and garlic. Horseradish. Fruits Oranges, grapefruit, and lemon (fruit and juice). Meats High-fat meats, fish, and poultry. This includes hot dogs, ribs, ham, sausage, salami, and bacon. Dairy Whole milk and chocolate milk. Sour cream. Cream. Butter. Ice cream. Cream cheese.  Beverages Coffee and tea, with or without caffeine. Carbonated beverages or energy drinks. Condiments Hot sauce. Barbecue sauce.  Sweets/Desserts Chocolate and cocoa. Donuts. Peppermint and spearmint. Fats and Oils High-fat foods, including [place] fries and potato chips. Other Vinegar. Strong spices, such as black pepper, white pepper, red pepper, cayenne, curry powder, cloves, ginger, and chili powder. The items listed above may not be a complete list of  foods and beverages to avoid. Contact your dietitian for more information.   This information is not intended to replace advice given to you by your health care provider. Make sure you discuss any questions you have with your health care provider.   Document Released: 05/28/2005 Document Revised: 06/18/2014 Document Reviewed: 04/01/2013 Elsevier Interactive Patient Education [employer].

## 2016-02-15 NOTE — Assessment & Plan Note (Signed)
ldl at goal, continue crestor.

## 2016-02-19 ENCOUNTER — Other Ambulatory Visit: Payer: Self-pay | Admitting: Family

## 2016-03-14 ENCOUNTER — Other Ambulatory Visit: Payer: Self-pay | Admitting: Family

## 2016-04-04 ENCOUNTER — Telehealth: Payer: Self-pay | Admitting: Family

## 2016-04-04 NOTE — Telephone Encounter (Signed)
Ok with me 

## 2016-04-04 NOTE — Telephone Encounter (Signed)
Pt would like to switch to Dr [place] from [facility] in [place]. Pt works closer to our office, lives in [place] and our office is [place]. Pt was a previous [facility] pt before that and had to switch, Is that ok with you Melissa? OK with you Dr Martinique?

## 2016-04-04 NOTE — Telephone Encounter (Signed)
It is ok with me. Thanks, BJ 

## 2016-04-05 ENCOUNTER — Other Ambulatory Visit: Payer: Self-pay | Admitting: Family

## 2016-04-09 ENCOUNTER — Telehealth: Payer: Self-pay | Admitting: Family

## 2016-04-09 DIAGNOSIS — G4733 Obstructive sleep apnea (adult) (pediatric): Secondary | ICD-10-CM

## 2016-04-09 NOTE — Telephone Encounter (Signed)
Routed message to Darlina Guys with [facility] to see what is needed from [place].

## 2016-04-09 NOTE — Telephone Encounter (Signed)
Spoke with pt and she states she was using a nasal mask. Does not want to used [facility] due to an outstanding balance. Wants to use an online website called [employer]. Below fax # is to that website. I have pended order for CPAP mask. Please advise?

## 2016-04-09 NOTE — Telephone Encounter (Signed)
Patient is requesting a prescription for a new CPAP mask as hers broke. She would like a call when this is sent in. Please advise.    Company fax number: [phone]   Patient phone: [phone]

## 2016-04-10 NOTE — Telephone Encounter (Signed)
Signed.

## 2016-04-10 NOTE — Telephone Encounter (Signed)
Information faxed to the number below.

## 2016-05-02 NOTE — Progress Notes (Signed)
HPI: FU coronary disease. She has had a prior PCI of her diagonal. Her most recent catheterization in April of 2004 showed no obstructive disease, and her ejection fraction was normal. Her most recent Myoview was performed in Dec 2012. EF 77 and normal perfusion. ETT 12/15 showed borderline changes in inferior leads. Since I last saw her, she has some dyspnea on exertion but no orthopnea, PND, pedal edema, exertional chest pain or syncope.  Current Outpatient Prescriptions  Medication Sig Dispense Refill  . ARIPiprazole (ABILIFY) 5 MG tablet Take 5 mg by mouth daily.      Marland Kitchen aspirin 81 MG tablet Take 81 mg by mouth daily.      . BD PEN NEEDLE NANO U/F 32G X 4 MM MISC use once daily with INSULIN INJECTION 100 each 1  . BENICAR HCT 20-12.5 MG tablet take 1 tablet by mouth once daily 45 tablet 1  . buPROPion (WELLBUTRIN XL) 150 MG 24 hr tablet Take 450 mg by mouth daily.      . Calcium Carbonate-Vitamin D (CALTRATE 600+D) 600-400 MG-UNIT per tablet Take 1 tablet by mouth 2 (two) times daily.    Marland Kitchen glucose blood (ONE TOUCH ULTRA TEST) test strip TEST once daily 100 each 1  . Lancets (ONETOUCH ULTRASOFT) lancets TEST BLOOD SUGAR ONCE DAILY 100 each 0  . Liraglutide (VICTOZA) 18 MG/3ML SOPN inject 1.2 milligram subcutaneously every morning 3 mL 5  . metoprolol (LOPRESSOR) 50 MG tablet Take 1 tablet (50 mg total) by mouth 2 (two) times daily. 60 tablet 5  . Multiple Vitamins-Minerals (MULTIVITAMIN ADULTS 50+ PO) Take 1 tablet by mouth daily.    . ONE TOUCH ULTRA TEST test strip TEST once daily 100 each 3  . pantoprazole (PROTONIX) 40 MG tablet take 1 tablet by mouth once daily 30 tablet 5  . Probiotic Product (PROBIOTIC DAILY PO) Take 1 capsule by mouth daily.    . rosuvastatin (CRESTOR) 20 MG tablet take 1 tablet by mouth once daily 30 tablet 5   No current facility-administered medications for this visit.      Past Medical History:  Diagnosis Date  . Allergic rhinitis, cause unspecified    . ASCUS on Pap smear   . CAD (coronary artery disease)   . Depressive disorder, not elsewhere classified   . Esophageal reflux   . Fatty liver 04/26/2015  . Hyperlipidemia   . Hypertension   . Obstructive sleep apnea (adult) (pediatric)   . Personal history of goiter    childhood  . Type II or unspecified type diabetes mellitus without mention of complication, not stated as uncontrolled     Past Surgical History:  Procedure Laterality Date  . colposcopy  2012   cervical biopsy (Dr. Benjie Karvonen)  . CORONARY STENT PLACEMENT  2004  . goiter removal  1968  . TUBAL LIGATION  1985    Social History   Social History  . Marital status: Married    Spouse name: N/A  . Number of children: 3  . Years of education: N/A   Occupational History  . 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  [employer]   Social History Main Topics  . Smoking status: Current Some Day Smoker    Years: 46.00    Types: E-cigarettes    Last attempt to quit: 09/23/2012  . Smokeless tobacco: Never Used     Comment: Uses vapor cigarette  . Alcohol use 0.0 oz/week  Comment: occasional  . Drug use: No  . Sexual activity: Not on file   Other Topics Concern  . Not on file   Social History Narrative   Caffeine Use:  2 cups coffee daily   Regular exercise:  No          Family History  Problem Relation Age of Onset  . Cancer Mother     throat, cervical  . Diabetes Mother   . Stroke Mother   . Heart attack Mother   . Heart disease Mother   . Hypertension Mother   . Cancer Father     prostate  . Diabetes Sister   . Heart attack Sister   . Hypertension Sister   . Hyperlipidemia Sister   . Colon cancer Neg Hx     ROS: no fevers or chills, productive cough, hemoptysis, dysphasia, odynophagia, melena, hematochezia, dysuria, hematuria, rash, seizure activity, orthopnea, PND, pedal edema, claudication. Remaining systems are negative.  Physical Exam: Well-developed obese in no  acute distress.  Skin is warm and dry.  HEENT is normal.  Neck is supple.  Chest is clear to auscultation with normal expansion.  Cardiovascular exam is regular rate and rhythm.  Abdominal exam nontender or distended. No masses palpated. Extremities show no edema. neuro grossly intact  ECG-sinus rhythm at a rate of 63. No ST changes.  A/P  1 hyperlipidemia-continue statin. Check lipids and liver.  2 hypertension- blood pressure mildly elevated. I have asked her to follow this at home and we will advance medications as needed. Goal systolic blood pressure less than [id number] and diastolic less than 85.  3 coronary artery disease-continue aspirin and statin. We discussed the importance of diet, weight loss and exercise.   Kirk Ruths MD

## 2016-05-07 ENCOUNTER — Encounter: Payer: Self-pay | Admitting: Cardiology

## 2016-05-09 ENCOUNTER — Encounter: Payer: Self-pay | Admitting: Family

## 2016-05-09 LAB — HM DIABETES EYE EXAM

## 2016-05-09 NOTE — Progress Notes (Signed)
05/09/16  

## 2016-05-14 ENCOUNTER — Encounter: Payer: Self-pay | Admitting: Cardiology

## 2016-05-14 ENCOUNTER — Ambulatory Visit (INDEPENDENT_AMBULATORY_CARE_PROVIDER_SITE_OTHER): Payer: 59 | Admitting: Cardiology

## 2016-05-14 VITALS — BP 144/82 | HR 63 | Ht 63.0 in | Wt 202.0 lb

## 2016-05-14 DIAGNOSIS — I2510 Atherosclerotic heart disease of native coronary artery without angina pectoris: Secondary | ICD-10-CM | POA: Diagnosis not present

## 2016-05-14 DIAGNOSIS — E7800 Pure hypercholesterolemia, unspecified: Secondary | ICD-10-CM | POA: Diagnosis not present

## 2016-05-14 DIAGNOSIS — I10 Essential (primary) hypertension: Secondary | ICD-10-CM

## 2016-05-14 NOTE — Patient Instructions (Signed)
Medication Instructions:   NO CHANGE  Labwork:  Your physician recommends that you return for lab work WHEN FASTING  Follow-Up:  Your physician wants you to follow-up in: ONE YEAR WITH DR CRENSHAW You will receive a reminder letter in the mail two months in advance. If you don't receive a letter, please call our office to schedule the follow-up appointment.   If you need a refill on your cardiac medications before your next appointment, please call your pharmacy.    

## 2016-05-21 NOTE — Telephone Encounter (Signed)
Left message to call back  

## 2016-05-22 NOTE — Telephone Encounter (Signed)
Spoke with Lattie Haw and advised her per below documentation that pt is using an [employer] and will not be getting supplies from [employer].

## 2016-05-22 NOTE — Telephone Encounter (Signed)
Lattie Haw with [facility] is calling about the medical necessity letter for patient's CPAP supplies. Please advise.   Phone: [phone] ext [phone]

## 2016-05-23 ENCOUNTER — Ambulatory Visit (INDEPENDENT_AMBULATORY_CARE_PROVIDER_SITE_OTHER): Payer: 59 | Admitting: Family

## 2016-05-23 ENCOUNTER — Encounter: Payer: Self-pay | Admitting: Family

## 2016-05-23 VITALS — BP 144/77 | HR 62 | Temp 97.9°F | Resp 18 | Ht 63.0 in | Wt 201.2 lb

## 2016-05-23 DIAGNOSIS — I10 Essential (primary) hypertension: Secondary | ICD-10-CM

## 2016-05-23 DIAGNOSIS — E1169 Type 2 diabetes mellitus with other specified complication: Secondary | ICD-10-CM

## 2016-05-23 DIAGNOSIS — E669 Obesity, unspecified: Secondary | ICD-10-CM

## 2016-05-23 DIAGNOSIS — E785 Hyperlipidemia, unspecified: Secondary | ICD-10-CM

## 2016-05-23 DIAGNOSIS — E119 Type 2 diabetes mellitus without complications: Secondary | ICD-10-CM

## 2016-05-23 LAB — BASIC METABOLIC PANEL
BUN: 14 mg/dL (ref 6–23)
CO2: 28 mEq/L (ref 19–32)
Calcium: 9 mg/dL (ref 8.4–10.5)
Chloride: 107 mEq/L (ref 96–112)
Creatinine, Ser: 0.76 mg/dL (ref 0.40–1.20)
GFR: 80.84 mL/min (ref >60.00)
Glucose, Bld: 142 mg/dL — ABNORMAL HIGH (ref 70–99)
Potassium: 3.8 mEq/L (ref 3.5–5.1)
Sodium: 142 mEq/L (ref 135–145)

## 2016-05-23 LAB — MICROALBUMIN / CREATININE URINE RATIO
Creatinine,U: 93.5 mg/dL
Microalb Creat Ratio: 2 mg/g (ref 0.0–30.0)
Microalb, Ur: 1.9 mg/dL (ref 0.0–1.9)

## 2016-05-23 LAB — LIPID PANEL
Cholesterol: 119 mg/dL (ref 0–200)
HDL: 52.2 mg/dL (ref >39.00)
LDL Cholesterol: 42 mg/dL (ref 0–99)
NonHDL: 66.84
Total CHOL/HDL Ratio: 2
Triglycerides: 124 mg/dL (ref 0.0–149.0)
VLDL: 24.8 mg/dL (ref 0.0–40.0)

## 2016-05-23 LAB — HEMOGLOBIN A1C: Hgb A1c MFr Bld: 6.7 % — ABNORMAL HIGH (ref 4.6–6.5)

## 2016-05-23 MED ORDER — LIRAGLUTIDE 18 MG/3ML ~~LOC~~ SOPN: PEN_INJECTOR | SUBCUTANEOUS | 5 refills | Status: DC

## 2016-05-23 MED ORDER — HYDROCHLOROTHIAZIDE 12.5 MG PO CAPS: 12.5000 mg | ORAL_CAPSULE | Freq: Every day | ORAL | 2 refills | Status: DC

## 2016-05-23 MED ORDER — LISINOPRIL 5 MG PO TABS: 5.0000 mg | ORAL_TABLET | Freq: Every day | ORAL | 3 refills | Status: DC

## 2016-05-23 NOTE — Progress Notes (Signed)
Pre visit review using our clinic review tool, if applicable. No additional management support is needed unless otherwise documented below in the visit note. 

## 2016-05-23 NOTE — Progress Notes (Signed)
Subjective:    Patient ID: [name] [name], female    DOB: [birth year], 65 y.o.   MRN: [id number]  HPI  [name] [name] is a 65 yr old female who presents today for follow up  1) DM2-  Maintained on victoza.   Lab Results  Component Value Date   HGBA1C 6.2 02/15/2016   HGBA1C 6.1 11/16/2015   HGBA1C 6.9 (H) 08/18/2015   Lab Results  Component Value Date   MICROALBUR 1.7 12/21/2014   LDLCALC 76 08/18/2015   CREATININE 0.77 11/16/2015   2) HTN- Maintained on metoprolol   BP Readings from Last 3 Encounters:  05/23/16 (!) 144/77  05/14/16 (!) 144/82  02/15/16 137/72   3) Hyperlipidemia-  She is maintained on crestor.  Lab Results  Component Value Date   CHOL 147 08/18/2015   HDL 49.30 08/18/2015   LDLCALC 76 08/18/2015   LDLDIRECT 77.7 12/27/2009   TRIG 110.0 08/18/2015   CHOLHDL 3 08/18/2015   Review of Systems Past Medical History:  Diagnosis Date  . Allergic rhinitis, cause unspecified   . ASCUS on Pap smear   . CAD (coronary artery disease)   . Depressive disorder, not elsewhere classified   . Esophageal reflux   . Fatty liver 04/26/2015  . Hyperlipidemia   . Hypertension   . Obstructive sleep apnea (adult) (pediatric)   . Personal history of goiter    childhood  . Type II or unspecified type diabetes mellitus without mention of complication, not stated as uncontrolled      Social History   Social History  . Marital status: Married    Spouse name: N/A  . Number of children: 3  . Years of education: N/A   Occupational History  . 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  [employer]   Social History Main Topics  . Smoking status: Current Some Day Smoker    Years: 46.00    Types: E-cigarettes    Last attempt to quit: 09/23/2012  . Smokeless tobacco: Never Used     Comment: Uses vapor cigarette  . Alcohol use 0.0 oz/week     Comment: occasional  . Drug use: No  . Sexual activity: Not on file   Other Topics Concern  . Not on file     Social History Narrative   Caffeine Use:  2 cups coffee daily   Regular exercise:  No          Past Surgical History:  Procedure Laterality Date  . colposcopy  2012   cervical biopsy (Dr. Benjie Karvonen)  . CORONARY STENT PLACEMENT  2004  . goiter removal  1968  . TUBAL LIGATION  1985    Family History  Problem Relation Age of Onset  . Cancer Mother     throat, cervical  . Diabetes Mother   . Stroke Mother   . Heart attack Mother   . Heart disease Mother   . Hypertension Mother   . Cancer Father     prostate  . Diabetes Sister   . Heart attack Sister   . Hypertension Sister   . Hyperlipidemia Sister   . Colon cancer Neg Hx     No Known Allergies  Current Outpatient Prescriptions on File Prior to Visit  Medication Sig Dispense Refill  . ARIPiprazole (ABILIFY) 5 MG tablet Take 5 mg by mouth daily.      Marland Kitchen aspirin 81 MG tablet Take 81 mg by mouth daily.      Marland Kitchen  BD PEN NEEDLE NANO U/F 32G X 4 MM MISC use once daily with INSULIN INJECTION 100 each 1  . BENICAR HCT 20-12.5 MG tablet take 1 tablet by mouth once daily 45 tablet 1  . buPROPion (WELLBUTRIN XL) 150 MG 24 hr tablet Take 450 mg by mouth daily.      . Calcium Carbonate-Vitamin D (CALTRATE 600+D) 600-400 MG-UNIT per tablet Take 1 tablet by mouth 2 (two) times daily.    Marland Kitchen glucose blood (ONE TOUCH ULTRA TEST) test strip TEST once daily 100 each 1  . Lancets (ONETOUCH ULTRASOFT) lancets TEST BLOOD SUGAR ONCE DAILY 100 each 0  . Liraglutide (VICTOZA) 18 MG/3ML SOPN inject 1.2 milligram subcutaneously every morning 3 mL 5  . metoprolol (LOPRESSOR) 50 MG tablet Take 1 tablet (50 mg total) by mouth 2 (two) times daily. 60 tablet 5  . Multiple Vitamins-Minerals (MULTIVITAMIN ADULTS 50+ PO) Take 1 tablet by mouth daily.    . ONE TOUCH ULTRA TEST test strip TEST once daily 100 each 3  . pantoprazole (PROTONIX) 40 MG tablet take 1 tablet by mouth once daily 30 tablet 5  . Probiotic Product (PROBIOTIC DAILY PO) Take 1 capsule by  mouth daily.    . rosuvastatin (CRESTOR) 20 MG tablet take 1 tablet by mouth once daily 30 tablet 5   No current facility-administered medications on file prior to visit.     BP (!) 144/77 (BP Location: Left Arm, Cuff Size: Large)   Pulse 62   Temp 97.9 F (36.6 C) (Oral)   Resp 18   Ht 5\' 3"  (1.6 m)   Wt 201 lb 3.2 oz (91.3 kg)   LMP  (Exact Date)   SpO2 97% Comment: room air  BMI 35.64 kg/m       Objective:   Physical Exam  Constitutional: She is oriented to person, place, and time. She appears well-developed and well-nourished.  HENT:  Head: Normocephalic and atraumatic.  Cardiovascular: Normal rate, regular rhythm and normal heart sounds.   No murmur heard. Pulmonary/Chest: Effort normal and breath sounds normal. No respiratory distress. She has no wheezes.  Musculoskeletal: She exhibits no edema.  Neurological: She is alert and oriented to person, place, and time.  Psychiatric: She has a normal mood and affect. Her behavior is normal. Judgment and thought content normal.          Assessment & Plan:

## 2016-05-23 NOTE — Assessment & Plan Note (Addendum)
Above goal.  Will add hctz 12.5mg  to her regimen.  I was going to add lisinopril but see she is already on ARB (lisinopril rx cancelled at pharmacy) Plan follow up in 2 weeks for nurse visit and follow up bmet.

## 2016-05-23 NOTE — Patient Instructions (Addendum)
Please complete lab work prior to leaving. Add lisinopril for blood pressure and kidney protection.

## 2016-05-23 NOTE — Assessment & Plan Note (Signed)
Clinically stable on victoza, continue same. We talked at length today about the importance of weight loss, diet and exercise.

## 2016-05-23 NOTE — Assessment & Plan Note (Addendum)
Obtain follow up lft/lipid panel, continue crestor.

## 2016-06-05 ENCOUNTER — Ambulatory Visit (INDEPENDENT_AMBULATORY_CARE_PROVIDER_SITE_OTHER): Payer: 59 | Admitting: Family Medicine

## 2016-06-05 ENCOUNTER — Encounter: Payer: Self-pay | Admitting: Family Medicine

## 2016-06-05 VITALS — BP 140/78 | HR 63

## 2016-06-05 DIAGNOSIS — I10 Essential (primary) hypertension: Secondary | ICD-10-CM

## 2016-06-05 LAB — BASIC METABOLIC PANEL
BUN: 15 mg/dL (ref 6–23)
CO2: 30 mEq/L (ref 19–32)
Calcium: 9.4 mg/dL (ref 8.4–10.5)
Chloride: 107 mEq/L (ref 96–112)
Creatinine, Ser: 0.74 mg/dL (ref 0.40–1.20)
GFR: 83.36 mL/min (ref >60.00)
Glucose, Bld: 97 mg/dL (ref 70–99)
Potassium: 3.8 mEq/L (ref 3.5–5.1)
Sodium: 145 mEq/L (ref 135–145)

## 2016-06-05 NOTE — Patient Instructions (Addendum)
Per Dr. Carollee Herter: Continue current medications & regimen. Be sure to take all doses of medications. Complete BMET (Basic Metabolic Panel) lab work today. Return in 2-3 weeks for a nurse visit to have blood pressure checked.

## 2016-06-05 NOTE — Progress Notes (Signed)
Pre visit review using our clinic review tool, if applicable. No additional management support is needed unless otherwise documented below in the visit note.  Patient came in office for a blood pressure check. Reviewed medications & regimen with the patient. She reported missing her morning doses of medication on yesterday. Today's readings were as follow: BP 152/77 P 62 & BP 140/78 P 63.  Per Dr. Carollee Herter: Continue current medications & regimen. Be sure to take all doses of medications. Complete BMET (Basic Metabolic Panel) lab work today. Return in 2-3 weeks for a nurse visit to have blood pressure checked.  Informed patient of the provider's recommendations. She verbalized understanding and did not have any questions or concerns before leaving the office.  Next appointment scheduled for 06/27/16 at 9:00 AM.

## 2016-06-14 ENCOUNTER — Telehealth: Payer: Self-pay | Admitting: Family

## 2016-06-14 MED ORDER — LIRAGLUTIDE 18 MG/3ML ~~LOC~~ SOPN: 1.2000 mg | PEN_INJECTOR | SUBCUTANEOUS | 0 refills | Status: DC

## 2016-06-14 NOTE — Telephone Encounter (Signed)
Relation to [id number] Call back number:[phone]   Reason for call:  Patient inquiring if we have samples of liraglutide (VICTOZA) 18 MG/3ML SOPN and the needles, please advise

## 2016-06-14 NOTE — Telephone Encounter (Signed)
Patient came into office.  Sample given. Insurance was canceled by mistake.

## 2016-07-03 ENCOUNTER — Ambulatory Visit (INDEPENDENT_AMBULATORY_CARE_PROVIDER_SITE_OTHER): Payer: 59 | Admitting: Family

## 2016-07-03 DIAGNOSIS — I10 Essential (primary) hypertension: Secondary | ICD-10-CM | POA: Diagnosis not present

## 2016-07-03 LAB — BASIC METABOLIC PANEL
BUN: 17 mg/dL (ref 6–23)
CO2: 29 mEq/L (ref 19–32)
Calcium: 8.9 mg/dL (ref 8.4–10.5)
Chloride: 103 mEq/L (ref 96–112)
Creatinine, Ser: 0.82 mg/dL (ref 0.40–1.20)
GFR: 74.03 mL/min (ref >60.00)
Glucose, Bld: 263 mg/dL — ABNORMAL HIGH (ref 70–99)
Potassium: 3.6 mEq/L (ref 3.5–5.1)
Sodium: 139 mEq/L (ref 135–145)

## 2016-07-03 NOTE — Progress Notes (Signed)
Noted and agree. 

## 2016-07-03 NOTE — Progress Notes (Signed)
Pre visit review using our clinic tool,if applicable. No additional management support is needed unless otherwise documented below in the visit note.   Patient in for BP check per order from M. Edwena Blow, NP dated 06/05/16.  BP today is  129/81 per M.Inda Castle NP patient to have BMP drawn today and return in 3,months for follow up visit.

## 2016-07-03 NOTE — Progress Notes (Deleted)
   Subjective:    Patient ID: [name] [name], female    DOB: Jun 22, 1950, 66 y.o.   MRN: [id number]  HPI    Review of Systems     Objective:   Physical Exam        Assessment & Plan:

## 2016-08-14 NOTE — Telephone Encounter (Signed)
Sent message via mychart due to not being able to contact pt. Pt states she will stay where she is at for now.  Nothing further needed.

## 2016-08-22 ENCOUNTER — Ambulatory Visit (INDEPENDENT_AMBULATORY_CARE_PROVIDER_SITE_OTHER): Payer: 59 | Admitting: Family

## 2016-08-22 ENCOUNTER — Encounter: Payer: Self-pay | Admitting: Family

## 2016-08-22 VITALS — BP 138/61 | HR 62 | Temp 97.6°F | Resp 16 | Ht 63.0 in | Wt 202.2 lb

## 2016-08-22 DIAGNOSIS — B351 Tinea unguium: Secondary | ICD-10-CM | POA: Diagnosis not present

## 2016-08-22 DIAGNOSIS — I10 Essential (primary) hypertension: Secondary | ICD-10-CM | POA: Diagnosis not present

## 2016-08-22 DIAGNOSIS — E785 Hyperlipidemia, unspecified: Secondary | ICD-10-CM | POA: Diagnosis not present

## 2016-08-22 DIAGNOSIS — Z5181 Encounter for therapeutic drug level monitoring: Secondary | ICD-10-CM | POA: Diagnosis not present

## 2016-08-22 DIAGNOSIS — E119 Type 2 diabetes mellitus without complications: Secondary | ICD-10-CM | POA: Diagnosis not present

## 2016-08-22 DIAGNOSIS — E669 Obesity, unspecified: Secondary | ICD-10-CM

## 2016-08-22 DIAGNOSIS — Z23 Encounter for immunization: Secondary | ICD-10-CM

## 2016-08-22 DIAGNOSIS — E1169 Type 2 diabetes mellitus with other specified complication: Secondary | ICD-10-CM | POA: Diagnosis not present

## 2016-08-22 DIAGNOSIS — K219 Gastro-esophageal reflux disease without esophagitis: Secondary | ICD-10-CM

## 2016-08-22 LAB — HEMOGLOBIN A1C: Hgb A1c MFr Bld: 7 % — ABNORMAL HIGH (ref 4.6–6.5)

## 2016-08-22 LAB — HEPATIC FUNCTION PANEL
ALT: 47 U/L — ABNORMAL HIGH (ref 0–35)
AST: 28 U/L (ref 0–37)
Albumin: 4.1 g/dL (ref 3.5–5.2)
Alkaline Phosphatase: 55 U/L (ref 39–117)
Bilirubin, Direct: 0.1 mg/dL (ref 0.0–0.3)
Total Bilirubin: 0.5 mg/dL (ref 0.2–1.2)
Total Protein: 6.5 g/dL (ref 6.0–8.3)

## 2016-08-22 LAB — BASIC METABOLIC PANEL
BUN: 18 mg/dL (ref 6–23)
CO2: 27 mEq/L (ref 19–32)
Calcium: 9.2 mg/dL (ref 8.4–10.5)
Chloride: 106 mEq/L (ref 96–112)
Creatinine, Ser: 0.75 mg/dL (ref 0.40–1.20)
GFR: 82.03 mL/min (ref >60.00)
Glucose, Bld: 159 mg/dL — ABNORMAL HIGH (ref 70–99)
Potassium: 3.7 mEq/L (ref 3.5–5.1)
Sodium: 143 mEq/L (ref 135–145)

## 2016-08-22 MED ORDER — TERBINAFINE HCL 250 MG PO TABS: 250.0000 mg | ORAL_TABLET | Freq: Every day | ORAL | 0 refills | Status: DC

## 2016-08-22 NOTE — Assessment & Plan Note (Signed)
rx with lamisil with plans to repeat LFT in 1 month. This will also clear up her tinea pedis.

## 2016-08-22 NOTE — Assessment & Plan Note (Signed)
BP stable on current meds. Continue same,obtain follow up bmet.  

## 2016-08-22 NOTE — Assessment & Plan Note (Addendum)
Clinically stable on victoza, Continue same. Obtain a1c, follow up bmet today.

## 2016-08-22 NOTE — Assessment & Plan Note (Signed)
Stable on PPI, continue same.  

## 2016-08-22 NOTE — Progress Notes (Signed)
Pre visit review using our clinic review tool, if applicable. No additional management support is needed unless otherwise documented below in the visit note. 

## 2016-08-22 NOTE — Progress Notes (Signed)
Subjective:    Patient ID: [name] [name], female    DOB: 1951/04/02, 66 y.o.   MRN: [id number]  HPI  [name] [name] is a 66 yr old female who presents today for follow up.  1) DM2- on victoza.  Not checking sugars at home. Denies symptomatic hypoglycemia.  She will be meeting with Dr. Dennard Nip re: weight loss.  Lab Results  Component Value Date   HGBA1C 6.7 (H) 05/23/2016   HGBA1C 6.2 02/15/2016   HGBA1C 6.1 11/16/2015   Lab Results  Component Value Date   MICROALBUR 1.9 05/23/2016   LDLCALC 42 05/23/2016   CREATININE 0.82 07/03/2016   2) HTN- maintained on benicar hct, metoprolol and hctz. She denies cp/sob or current swelling BP Readings from Last 3 Encounters:  08/22/16 138/61  06/05/16 140/78  05/23/16 (!) 144/77   3) Hyperlipidemia- maintained on crestor 20mg . Denies myalgia.  Lab Results  Component Value Date   CHOL 119 05/23/2016   HDL 52.20 05/23/2016   LDLCALC 42 05/23/2016   LDLDIRECT 77.7 12/27/2009   TRIG 124.0 05/23/2016   CHOLHDL 2 05/23/2016   GERD- maintained on protonix.  Reports that if she does not take protonix daily symptoms return.   Review of Systems See HPI  Past Medical History:  Diagnosis Date  . Allergic rhinitis, cause unspecified   . ASCUS on Pap smear   . CAD (coronary artery disease)   . Depressive disorder, not elsewhere classified   . Esophageal reflux   . Fatty liver 04/26/2015  . Hyperlipidemia   . Hypertension   . Obstructive sleep apnea (adult) (pediatric)   . Personal history of goiter    childhood  . Type II or unspecified type diabetes mellitus without mention of complication, not stated as uncontrolled      Social History   Social History  . Marital status: Married    Spouse name: N/A  . Number of children: 3  . Years of education: N/A   Occupational History  . 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  [employer]   Social History Main Topics  . Smoking status: Current Some Day  Smoker    Years: 46.00    Types: E-cigarettes    Last attempt to quit: 09/23/2012  . Smokeless tobacco: Never Used     Comment: Uses vapor cigarette  . Alcohol use 0.0 oz/week     Comment: occasional  . Drug use: No  . Sexual activity: Not on file   Other Topics Concern  . Not on file   Social History Narrative   Caffeine Use:  2 cups coffee daily   Regular exercise:  No          Past Surgical History:  Procedure Laterality Date  . colposcopy  2012   cervical biopsy (Dr. Benjie Karvonen)  . CORONARY STENT PLACEMENT  2004  . goiter removal  1968  . TUBAL LIGATION  1985    Family History  Problem Relation Age of Onset  . Cancer Mother     throat, cervical  . Diabetes Mother   . Stroke Mother   . Heart attack Mother   . Heart disease Mother   . Hypertension Mother   . Cancer Father     prostate  . Diabetes Sister   . Heart attack Sister   . Hypertension Sister   . Hyperlipidemia Sister   . Colon cancer Neg Hx     No Known Allergies  Current Outpatient Prescriptions on File Prior to Visit  Medication Sig Dispense Refill  . ARIPiprazole (ABILIFY) 5 MG tablet Take 5 mg by mouth daily.      Marland Kitchen aspirin 81 MG tablet Take 81 mg by mouth daily.      . BD PEN NEEDLE NANO U/F 32G X 4 MM MISC use once daily with INSULIN INJECTION 100 each 1  . BENICAR HCT 20-12.5 MG tablet take 1 tablet by mouth once daily 45 tablet 1  . buPROPion (WELLBUTRIN XL) 150 MG 24 hr tablet Take 450 mg by mouth daily.      . Calcium Carbonate-Vitamin D (CALTRATE 600+D) 600-400 MG-UNIT per tablet Take 1 tablet by mouth 2 (two) times daily.    Marland Kitchen glucose blood (ONE TOUCH ULTRA TEST) test strip TEST once daily 100 each 1  . hydrochlorothiazide (MICROZIDE) 12.5 MG capsule Take 1 capsule (12.5 mg total) by mouth daily. 30 capsule 2  . Lancets (ONETOUCH ULTRASOFT) lancets TEST BLOOD SUGAR ONCE DAILY 100 each 0  . liraglutide (VICTOZA) 18 MG/3ML SOPN inject 1.2 milligram subcutaneously every morning 3 mL 5  .  metoprolol (LOPRESSOR) 50 MG tablet Take 1 tablet (50 mg total) by mouth 2 (two) times daily. 60 tablet 5  . Multiple Vitamins-Minerals (MULTIVITAMIN ADULTS 50+ PO) Take 1 tablet by mouth daily.    . ONE TOUCH ULTRA TEST test strip TEST once daily 100 each 3  . pantoprazole (PROTONIX) 40 MG tablet take 1 tablet by mouth once daily 30 tablet 5  . Probiotic Product (PROBIOTIC DAILY PO) Take 1 capsule by mouth daily.    . rosuvastatin (CRESTOR) 20 MG tablet take 1 tablet by mouth once daily 30 tablet 5   No current facility-administered medications on file prior to visit.     BP 138/61 (BP Location: Left Arm, Patient Position: Sitting, Cuff Size: Large)   Pulse 62   Temp 97.6 F (36.4 C) (Oral)   Resp 16   Ht 5\' 3"  (1.6 m)   Wt 202 lb 3.2 oz (91.7 kg)   SpO2 98%   BMI 35.82 kg/m       Objective:   Physical Exam  Constitutional: She is oriented to person, place, and time. She appears well-developed and well-nourished.  Cardiovascular: Normal rate, regular rhythm and normal heart sounds.   No murmur heard. Pulmonary/Chest: Effort normal and breath sounds normal. No respiratory distress. She has no wheezes.  Neurological: She is alert and oriented to person, place, and time.  Skin: Skin is warm and dry.  Left great toenail is hypertrophic and discolored. Bilateral dry skin/tinea pedis noted  Psychiatric: She has a normal mood and affect. Her behavior is normal. Judgment and thought content normal.          Assessment & Plan:  Pneumovax booster today.

## 2016-08-22 NOTE — Patient Instructions (Signed)
Please complete lab work prior to leaving. Begin lamisil  Once daily. Schedule a follow up lab visit in 3.5 weeks.

## 2016-08-22 NOTE — Addendum Note (Signed)
Addended by: Kelle Darting A on: 08/22/2016 09:09 AM   Modules accepted: Orders

## 2016-08-30 ENCOUNTER — Encounter (INDEPENDENT_AMBULATORY_CARE_PROVIDER_SITE_OTHER): Payer: 59 | Admitting: Family Medicine

## 2016-08-30 ENCOUNTER — Other Ambulatory Visit: Payer: Self-pay | Admitting: Family

## 2016-09-05 ENCOUNTER — Ambulatory Visit (INDEPENDENT_AMBULATORY_CARE_PROVIDER_SITE_OTHER): Payer: 59 | Admitting: Family Medicine

## 2016-09-05 ENCOUNTER — Other Ambulatory Visit (INDEPENDENT_AMBULATORY_CARE_PROVIDER_SITE_OTHER): Payer: Self-pay | Admitting: Family Medicine

## 2016-09-05 ENCOUNTER — Encounter (INDEPENDENT_AMBULATORY_CARE_PROVIDER_SITE_OTHER): Payer: Self-pay | Admitting: Family Medicine

## 2016-09-05 VITALS — BP 129/85 | HR 75 | Temp 98.3°F | Ht 63.0 in | Wt 197.0 lb

## 2016-09-05 DIAGNOSIS — Z6834 Body mass index (BMI) 34.0-34.9, adult: Secondary | ICD-10-CM

## 2016-09-05 DIAGNOSIS — Z1389 Encounter for screening for other disorder: Secondary | ICD-10-CM | POA: Diagnosis not present

## 2016-09-05 DIAGNOSIS — E559 Vitamin D deficiency, unspecified: Secondary | ICD-10-CM

## 2016-09-05 DIAGNOSIS — Z9189 Other specified personal risk factors, not elsewhere classified: Secondary | ICD-10-CM | POA: Diagnosis not present

## 2016-09-05 DIAGNOSIS — E119 Type 2 diabetes mellitus without complications: Secondary | ICD-10-CM | POA: Diagnosis not present

## 2016-09-05 DIAGNOSIS — R0602 Shortness of breath: Secondary | ICD-10-CM | POA: Diagnosis not present

## 2016-09-05 DIAGNOSIS — G4739 Other sleep apnea: Secondary | ICD-10-CM

## 2016-09-05 DIAGNOSIS — E669 Obesity, unspecified: Secondary | ICD-10-CM | POA: Diagnosis not present

## 2016-09-05 DIAGNOSIS — Z0289 Encounter for other administrative examinations: Secondary | ICD-10-CM

## 2016-09-05 DIAGNOSIS — R5383 Other fatigue: Secondary | ICD-10-CM

## 2016-09-05 DIAGNOSIS — Z1331 Encounter for screening for depression: Secondary | ICD-10-CM

## 2016-09-05 NOTE — Progress Notes (Signed)
Office: [phone]  /  Fax: [phone]   HPI:   Chief Complaint: OBESITY  [name] [name] (MR# [id number]) is a 66 y.o. female who presents on 09/05/2016 for obesity evaluation and treatment. Current BMI is Body mass index is 34.9 kg/m.Marland Kitchen [name] [name] has struggled with obesity for years and has been unsuccessful in either losing weight or maintaining long term weight loss. [name] [name] attended our information session and states she is currently in the action stage of change and ready to dedicate time achieving and maintaining a healthier weight.  [name] [name] states her family eats meals together her desired weight loss is 52 to 62 lbs she started gaining weight since being diabetic her heaviest weight ever was 202 lbs. she has significant food cravings issues  she snacks frequently in the evenings she skips meals frequently she frequently makes poor food choices she has problems with excessive hunger  she frequently eats larger portions than normal  she has binge eating behaviors she struggles with emotional eating    [name] [name] feels her energy is lower than it should be. This has worsened with weight gain and has not worsened recently. [name] [name] admits to daytime somnolence and  admits to waking up still tired. Patient is at risk for obstructive sleep apnea. Patent has a history of symptoms of daytime [name]. Patient generally gets 6 or 7 hours of sleep per night, and states they generally have restless sleep. Snoring is present. Apneic episodes are present. Epworth Sleepiness Score is 8  Dyspnea on exertion [name] [name] notes increasing shortness of breath with exercising and seems to be worsening over time with weight gain. She notes getting out of breath sooner with activity than she used to. This has not gotten worse recently. [name] [name] denies orthopnea.  Vitamin D deficiency [name] [name] has a diagnosis of vitamin D deficiency. She is not currently taking vit D and no recent labs. She admits [name] and denies  nausea, vomiting or muscle weakness.  Diabetes II [name] [name] has a diagnosis of diabetes type II. [name] [name] states she is not checking blood sugars often at home, has been running around 200 fasting and denies any hypoglycemic episodes. Last A1c was at 7.0 but is likely higher now with those recent numbers. She has been working on intensive lifestyle modifications including diet, exercise, and weight loss to help control her blood glucose levels.  Diabetes risk counselling [name] [name] was given extended (at least 15 minutes) diabetes prevention counseling today. She is 66 y.o. female and has risk factors for hypoglycemia including obesity. We discussed intensive lifestyle modifications today with an emphasis on weight loss as well as increasing exercise and decreasing simple carbohydrates in her diet.  Sleep Apnea [name] [name] uses her CPAP every night.  Depression Screen [name] [name]'s Food and Mood (modified PHQ-9) score was  Depression screen PHQ 2/9 09/05/2016  Decreased Interest 1  Down, Depressed, Hopeless 1  PHQ - 2 Score 2  Altered sleeping 1  Tired, decreased energy 3  Change in appetite 2  Feeling bad or failure about yourself  3  Trouble concentrating 3  Moving slowly or fidgety/restless 1  Suicidal thoughts 0  PHQ-9 Score 15    ALLERGIES: No Known Allergies  MEDICATIONS: Current Outpatient Prescriptions on File Prior to Visit  Medication Sig Dispense Refill  . ARIPiprazole (ABILIFY) 5 MG tablet Take 5 mg by mouth daily.      Marland Kitchen aspirin 81 MG tablet Take 81 mg by mouth daily.      . BD PEN NEEDLE NANO U/F 32G  X 4 MM MISC use once daily with INSULIN INJECTION 100 each 1  . BENICAR HCT 20-12.5 MG tablet take 1 tablet by mouth once daily 45 tablet 1  . buPROPion (WELLBUTRIN XL) 150 MG 24 hr tablet Take 450 mg by mouth daily.      . Calcium Carbonate-Vitamin D (CALTRATE 600+D) 600-400 MG-UNIT per tablet Take 1 tablet by mouth 2 (two) times daily.    Marland Kitchen glucose blood (ONE TOUCH ULTRA TEST) test strip  TEST once daily 100 each 1  . hydrochlorothiazide (MICROZIDE) 12.5 MG capsule Take 1 capsule (12.5 mg total) by mouth daily. 30 capsule 2  . Lancets (ONETOUCH ULTRASOFT) lancets TEST BLOOD SUGAR ONCE DAILY 100 each 0  . liraglutide (VICTOZA) 18 MG/3ML SOPN inject 1.2 milligram subcutaneously every morning 3 mL 5  . metoprolol (LOPRESSOR) 50 MG tablet Take 1 tablet (50 mg total) by mouth 2 (two) times daily. 60 tablet 5  . Multiple Vitamins-Minerals (MULTIVITAMIN ADULTS 50+ PO) Take 1 tablet by mouth daily.    . ONE TOUCH ULTRA TEST test strip TEST once daily 100 each 3  . pantoprazole (PROTONIX) 40 MG tablet take 1 tablet by mouth once daily 30 tablet 5  . Probiotic Product (PROBIOTIC DAILY PO) Take 1 capsule by mouth daily.    . rosuvastatin (CRESTOR) 20 MG tablet take 1 tablet by mouth once daily 30 tablet 5  . terbinafine (LAMISIL) 250 MG tablet Take 1 tablet (250 mg total) by mouth daily. 30 tablet 0   No current facility-administered medications on file prior to visit.     PAST MEDICAL HISTORY: Past Medical History:  Diagnosis Date  . Allergic rhinitis, cause unspecified   . ASCUS on Pap smear   . Back pain   . CAD (coronary artery disease)   . Constipation   . Depression   . Depressive disorder, not elsewhere classified   . Esophageal reflux   . Fatty liver 04/26/2015  . GERD (gastroesophageal reflux disease)   . Hyperlipidemia   . Hypertension   . Obstructive sleep apnea (adult) (pediatric)   . Personal history of goiter    childhood  . Type II or unspecified type diabetes mellitus without mention of complication, not stated as uncontrolled     PAST SURGICAL HISTORY: Past Surgical History:  Procedure Laterality Date  . BUNIONECTOMY    . colposcopy  2012   cervical biopsy (Dr. Benjie Karvonen)  . CORONARY STENT PLACEMENT  2004  . goiter removal  1968  . TUBAL LIGATION  1985    SOCIAL HISTORY: Social History  Substance Use Topics  . Smoking status: Current Some Day Smoker      Years: 46.00    Types: E-cigarettes    Last attempt to quit: 09/23/2012  . Smokeless tobacco: Never Used     Comment: Uses vapor cigarette  . Alcohol use 0.0 oz/week     Comment: occasional    FAMILY HISTORY: Family History  Problem Relation Age of Onset  . Cancer Mother     throat, cervical  . Diabetes Mother   . Stroke Mother   . Heart attack Mother   . Heart disease Mother   . Hypertension Mother   . Thyroid disease Mother   . Alcoholism Mother   . Cancer Father     prostate  . Diabetes Sister   . Heart attack Sister   . Hypertension Sister   . Hyperlipidemia Sister   . Colon cancer Neg Hx     ROS:  Review of Systems  Constitutional: Positive for malaise/[name].       Swollen glands/Neck  HENT: Positive for congestion (nasal stuffiness) and hearing loss.        Dentures Dry mouth Hoarseness  Eyes:       Wear Glasses or Contacts  Respiratory: Positive for shortness of breath (with activity).   Gastrointestinal: Positive for constipation, diarrhea and heartburn. Negative for nausea and vomiting.  Musculoskeletal:       Negative muscle weakness  Skin:       Dryness   Endo/Heme/Allergies: Bruises/bleeds easily (easy bruising).  Psychiatric/Behavioral: Positive for depression. The patient has insomnia.     PHYSICAL EXAM: Blood pressure 129/85, pulse 75, temperature 98.3 F (36.8 C), temperature source Oral, height 5\' 3"  (1.6 m), weight 197 lb (89.4 kg), SpO2 95 %. Body mass index is 34.9 kg/m. Physical Exam  Constitutional: She is oriented to person, place, and time. She appears well-developed and well-nourished.  Cardiovascular: Normal rate.   Pulmonary/Chest: Effort normal.  Musculoskeletal: Normal range of motion.  Neurological: She is oriented to person, place, and time.  Skin: Skin is warm and dry.  Psychiatric: She has a normal mood and affect. Her behavior is normal.  Vitals reviewed.   RECENT LABS AND TESTS: BMET    Component Value  Date/Time   NA 143 08/22/2016 0906   K 3.7 08/22/2016 0906   CL 106 08/22/2016 0906   CO2 27 08/22/2016 0906   GLUCOSE 159 (H) 08/22/2016 0906   BUN 18 08/22/2016 0906   CREATININE 0.75 08/22/2016 0906   CREATININE 0.70 12/15/2014 1949   CALCIUM 9.2 08/22/2016 0906   GFRNONAA 80 10/27/2013 0910   GFRAA >89 10/27/2013 0910   Lab Results  Component Value Date   HGBA1C 7.0 (H) 08/22/2016   No results found for: INSULIN CBC    Component Value Date/Time   WBC 4.6 12/21/2014 0956   RBC 4.54 12/21/2014 0956   HGB 13.4 12/21/2014 0956   HCT 39.3 12/21/2014 0956   PLT 159.0 12/21/2014 0956   MCV 86.6 12/21/2014 0956   MCH 29.2 05/09/2011 0931   MCHC 34.0 12/21/2014 0956   RDW 15.3 12/21/2014 0956   LYMPHSABS 1.7 12/21/2014 0956   MONOABS 0.2 12/21/2014 0956   EOSABS 0.1 12/21/2014 0956   BASOSABS 0.0 12/21/2014 0956   Iron/TIBC/Ferritin/ %Sat    Component Value Date/Time   FERRITIN 37.3 12/29/2014 0856   Lipid Panel     Component Value Date/Time   CHOL 119 05/23/2016 0957   TRIG 124.0 05/23/2016 0957   HDL 52.20 05/23/2016 0957   CHOLHDL 2 05/23/2016 0957   VLDL 24.8 05/23/2016 0957   LDLCALC 42 05/23/2016 0957   LDLDIRECT 77.7 12/27/2009 0942   Hepatic Function Panel     Component Value Date/Time   PROT 6.5 08/22/2016 0906   ALBUMIN 4.1 08/22/2016 0906   AST 28 08/22/2016 0906   ALT 47 (H) 08/22/2016 0906   ALKPHOS 55 08/22/2016 0906   BILITOT 0.5 08/22/2016 0906   BILIDIR 0.1 08/22/2016 0906   IBILI 0.5 10/27/2013 0910      Component Value Date/Time   TSH 0.59 12/21/2014 0956   TSH 0.68 05/13/2014 0927   TSH 0.672 09/22/2012 1613    ECG  shows NSR with a rate of 74 BPM INDIRECT CALORIMETER done today shows a VO2 of 244 and a REE of 1701.    ASSESSMENT AND PLAN: Other [name] - Plan: EKG 12-Lead, Comprehensive metabolic panel, CBC With Differential, Lipid  Panel With LDL/HDL Ratio, T3, T4, free, TSH  Shortness of breath on exertion  Type 2  diabetes mellitus without complication, without long-term current use of insulin (HCC) - Plan: Hemoglobin A1c, Insulin, random, Microalbumin / creatinine urine ratio  Other sleep apnea  Vitamin D deficiency - Plan: VITAMIN D 25 Hydroxy (Vit-D Deficiency, Fractures)  Depression screening  Class 1 obesity without serious comorbidity with body mass index (BMI) of 34.0 to 34.9 in adult, unspecified obesity type  PLAN:  [name] [name] [name] was informed that her [name] may be related to obesity, depression or many other causes. Labs will be ordered, and in the meanwhile [name] [name] has agreed to work on diet, exercise and weight loss to help with [name]. Proper sleep hygiene was discussed including the need for 7-8 hours of quality sleep each night. A sleep study was not ordered based on symptoms and Epworth score.  Dyspnea on exertion [name] [name]'s shortness of breath appears to be obesity related and exercise induced. She has agreed to work on weight loss and gradually increase exercise to treat her exercise induced shortness of breath. If [name] [name] follows our instructions and loses weight without improvement of her shortness of breath, we will plan to refer to pulmonology. We will monitor this condition regularly. [name] [name] agrees to this plan.  Vitamin D Deficiency [name] [name] was informed that low vitamin D levels contributes to [name] and are associated with obesity, breast, and colon cancer. She agrees to continue to take prescription Vit D @50 ,000 IU every week, we will check labs and will follow up for routine testing of vitamin D, at least 2-3 times per year. She was informed of the risk of over-replacement of vitamin D and agrees to not increase her dose unless he discusses this with [place] first. Nadean agrees to follow up with our clinic in 2 weeks.  Diabetes II [name] [name] has been given extensive diabetes education by myself today including ideal fasting and post-prandial blood glucose readings, individual ideal Hgb A1c  goals  and hypoglycemia prevention. We discussed the importance of good blood sugar control to decrease the likelihood of diabetic complications such as nephropathy, neuropathy, limb loss, blindness, coronary artery disease, and death. We discussed the importance of intensive lifestyle modification including diet, exercise and weight loss as the first line treatment for diabetes. [name] [name] agrees to continue her diabetes medications and will follow up at the agreed upon time.  Hypoglycemia risk counselling [name] [name] was given extended (at least 15 minutes) hypoglycemia prevention counseling today. We discussed intensive lifestyle modifications today with an emphasis on weight loss as well as increasing exercise and decreasing simple carbohydrates in her diet.  Sleep Apnea [name] [name] agrees to work on diet, exercise and weight loss to help with sleep apnea and will continue CPAP as directed.  Depression Screen [name] [name] had a strongly positive depression screening. Depression is commonly associated with obesity and often results in emotional eating behaviors. We will monitor this closely and work on CBT to help improve the non-hunger eating patterns. Referral to Psychology may be required if no improvement is seen as she continues in our clinic.  Obesity [name] [name] is currently in the action stage of change and her goal is to continue with weight loss efforts She has agreed to follow the Category 2 plan +100 calories [name] [name] has been instructed to work up to a goal of 150 minutes of combined cardio and strengthening exercise per week for weight loss and overall health benefits. We discussed the following Behavioral Modification Stratagies today: increasing lean  protein intake, work on meal planning and easy cooking plans, decrease snacking  and decrease liquid calories  Zamia has agreed to follow up with our clinic in 2 weeks. She was informed of the importance of frequent follow up visits to maximize her success with  intensive lifestyle modifications for her multiple health conditions. She was informed we would discuss her lab results at her next visit unless there is a critical issue that needs to be addressed sooner. [name] [name] agreed to keep her next visit at the agreed upon time to discuss these results.  I, Doreene Nest, am acting as scribe for Dennard Nip, MD  I have reviewed the above documentation for accuracy and completeness, and I agree with the above. -Dennard Nip, MD

## 2016-09-06 ENCOUNTER — Other Ambulatory Visit: Payer: Self-pay | Admitting: Family

## 2016-09-06 LAB — LIPID PANEL WITH LDL/HDL RATIO
Cholesterol, Total: 132 mg/dL (ref 100–199)
HDL: 53 mg/dL (ref >39)
LDL Calculated: 51 mg/dL (ref 0–99)
LDl/HDL Ratio: 1 ratio units (ref 0.0–3.2)
Triglycerides: 139 mg/dL (ref 0–149)
VLDL Cholesterol Cal: 28 mg/dL (ref 5–40)

## 2016-09-06 LAB — COMPREHENSIVE METABOLIC PANEL
ALT: 52 IU/L — ABNORMAL HIGH (ref 0–32)
AST: 31 IU/L (ref 0–40)
Albumin/Globulin Ratio: 1.7 (ref 1.2–2.2)
Albumin: 4.3 g/dL (ref 3.6–4.8)
Alkaline Phosphatase: 70 IU/L (ref 39–117)
BUN/Creatinine Ratio: 18 (ref 12–28)
BUN: 14 mg/dL (ref 8–27)
Bilirubin Total: 0.4 mg/dL (ref 0.0–1.2)
CO2: 26 mmol/L (ref 18–29)
Calcium: 9.9 mg/dL (ref 8.7–10.3)
Chloride: 101 mmol/L (ref 96–106)
Creatinine, Ser: 0.77 mg/dL (ref 0.57–1.00)
GFR calc Af Amer: 93 mL/min/{1.73_m2} (ref >59)
GFR calc non Af Amer: 81 mL/min/{1.73_m2} (ref >59)
Globulin, Total: 2.6 g/dL (ref 1.5–4.5)
Glucose: 129 mg/dL — ABNORMAL HIGH (ref 65–99)
Potassium: 4.1 mmol/L (ref 3.5–5.2)
Sodium: 142 mmol/L (ref 134–144)
Total Protein: 6.9 g/dL (ref 6.0–8.5)

## 2016-09-06 LAB — CBC WITH DIFFERENTIAL
Basophils Absolute: 0 10*3/uL (ref 0.0–0.2)
Basos: 0 %
EOS (ABSOLUTE): 0.1 10*3/uL (ref 0.0–0.4)
Eos: 3 %
Hematocrit: 42.1 % (ref 34.0–46.6)
Hemoglobin: 13.3 g/dL (ref 11.1–15.9)
Immature Grans (Abs): 0 10*3/uL (ref 0.0–0.1)
Immature Granulocytes: 0 %
Lymphocytes Absolute: 1.7 10*3/uL (ref 0.7–3.1)
Lymphs: 33 %
MCH: 28 pg (ref 26.6–33.0)
MCHC: 31.6 g/dL (ref 31.5–35.7)
MCV: 89 fL (ref 79–97)
Monocytes Absolute: 0.2 10*3/uL (ref 0.1–0.9)
Monocytes: 4 %
Neutrophils Absolute: 3.1 10*3/uL (ref 1.4–7.0)
Neutrophils: 60 %
RBC: 4.75 x10E6/uL (ref 3.77–5.28)
RDW: 15.7 % — ABNORMAL HIGH (ref 12.3–15.4)
WBC: 5.1 10*3/uL (ref 3.4–10.8)

## 2016-09-06 LAB — INSULIN, RANDOM: INSULIN: 66.8 u[IU]/mL — ABNORMAL HIGH (ref 2.6–24.9)

## 2016-09-06 LAB — T4, FREE: Free T4: 1.17 ng/dL (ref 0.82–1.77)

## 2016-09-06 LAB — HEMOGLOBIN A1C
Est. average glucose Bld gHb Est-mCnc: 157 mg/dL
Hgb A1c MFr Bld: 7.1 % — ABNORMAL HIGH (ref 4.8–5.6)

## 2016-09-06 LAB — VITAMIN D 25 HYDROXY (VIT D DEFICIENCY, FRACTURES): Vit D, 25-Hydroxy: 37.2 ng/mL (ref 30.0–100.0)

## 2016-09-06 LAB — T3: T3, Total: 123 ng/dL (ref 71–180)

## 2016-09-06 LAB — MICROALBUMIN / CREATININE URINE RATIO
Creatinine, Urine: 80.8 mg/dL
Microalb/Creat Ratio: 24.9 mg/g creat (ref 0.0–30.0)
Microalbumin, Urine: 20.1 ug/mL

## 2016-09-06 LAB — TSH: TSH: 0.624 u[IU]/mL (ref 0.450–4.500)

## 2016-09-13 ENCOUNTER — Other Ambulatory Visit: Payer: Self-pay | Admitting: Family Medicine

## 2016-09-13 ENCOUNTER — Other Ambulatory Visit: Payer: Self-pay | Admitting: Family

## 2016-09-14 MED ORDER — PANTOPRAZOLE SODIUM 40 MG PO TBEC: 40.0000 mg | DELAYED_RELEASE_TABLET | Freq: Every day | ORAL | 5 refills | Status: DC

## 2016-09-14 NOTE — Telephone Encounter (Signed)
eScribe request from [facility] for refill on Benicar-HCTZ 20-12.5mg  Last filled - 02/20/16, #45x1 Last AEX - 08/22/16 LMOM with contact name and number for return call RE: if patient is taking one-half or one tablet daily, as it had been noted that she was taking differently than px instructions in med history; so that I may know how many tablets to send in to pharmacy for a 90-day supply [45 or 90]/SLS 04/06  Per 08/22/16 OV Notes: 2) HTN- maintained on benicar hct, metoprolol and hctz. She denies cp/sob or current swelling    BP Readings from Last 3 Encounters:  08/22/16 138/61  06/05/16 140/78  05/23/16 (!) 144/77

## 2016-09-14 NOTE — Telephone Encounter (Signed)
Patient returned call and states that she takes 1/2 [0.5] tablet daily, therefore, 45 tablets cover 90-day supply; she also requested refilll on Pantoprazole. Informed her that I would get both medications to the pharmacy for her. Refill sent per [facility] refill protocol/SLS

## 2016-09-19 ENCOUNTER — Other Ambulatory Visit: Payer: 59

## 2016-09-20 ENCOUNTER — Other Ambulatory Visit: Payer: Self-pay | Admitting: Family

## 2016-09-20 ENCOUNTER — Other Ambulatory Visit (INDEPENDENT_AMBULATORY_CARE_PROVIDER_SITE_OTHER): Payer: 59

## 2016-09-20 ENCOUNTER — Other Ambulatory Visit (INDEPENDENT_AMBULATORY_CARE_PROVIDER_SITE_OTHER): Payer: Self-pay | Admitting: Family Medicine

## 2016-09-20 ENCOUNTER — Ambulatory Visit (INDEPENDENT_AMBULATORY_CARE_PROVIDER_SITE_OTHER): Payer: 59 | Admitting: Family Medicine

## 2016-09-20 VITALS — BP 125/84 | HR 61 | Temp 97.5°F | Ht 63.0 in | Wt 195.0 lb

## 2016-09-20 DIAGNOSIS — Z5181 Encounter for therapeutic drug level monitoring: Secondary | ICD-10-CM

## 2016-09-20 DIAGNOSIS — E119 Type 2 diabetes mellitus without complications: Secondary | ICD-10-CM | POA: Diagnosis not present

## 2016-09-20 DIAGNOSIS — K760 Fatty (change of) liver, not elsewhere classified: Secondary | ICD-10-CM | POA: Insufficient documentation

## 2016-09-20 DIAGNOSIS — E559 Vitamin D deficiency, unspecified: Secondary | ICD-10-CM | POA: Insufficient documentation

## 2016-09-20 DIAGNOSIS — Z6834 Body mass index (BMI) 34.0-34.9, adult: Secondary | ICD-10-CM | POA: Diagnosis not present

## 2016-09-20 DIAGNOSIS — E669 Obesity, unspecified: Secondary | ICD-10-CM | POA: Diagnosis not present

## 2016-09-20 DIAGNOSIS — Z9189 Other specified personal risk factors, not elsewhere classified: Secondary | ICD-10-CM | POA: Diagnosis not present

## 2016-09-20 LAB — HEPATIC FUNCTION PANEL
ALT: 45 U/L — ABNORMAL HIGH (ref 0–35)
AST: 25 U/L (ref 0–37)
Albumin: 4.3 g/dL (ref 3.5–5.2)
Alkaline Phosphatase: 57 U/L (ref 39–117)
Bilirubin, Direct: 0.1 mg/dL (ref 0.0–0.3)
Total Bilirubin: 0.5 mg/dL (ref 0.2–1.2)
Total Protein: 7.1 g/dL (ref 6.0–8.3)

## 2016-09-20 MED ORDER — LIRAGLUTIDE 18 MG/3ML ~~LOC~~ SOPN: PEN_INJECTOR | SUBCUTANEOUS | 0 refills | Status: DC

## 2016-09-20 MED ORDER — VITAMIN D (ERGOCALCIFEROL) 1.25 MG (50000 UNIT) PO CAPS: 50000.0000 [IU] | ORAL_CAPSULE | ORAL | 0 refills | Status: DC

## 2016-09-20 NOTE — Progress Notes (Signed)
Office: [phone]  /  Fax: [phone]   HPI:   Chief Complaint: OBESITY [name] [name] is here to discuss her progress with her obesity treatment plan. She is following her eating plan approximately 80 % of the time and states she is exercising 0 minutes 0 times per week. [name] [name] has done well with category 2 plan, she sometimes skipped lunch and didn't follow the plan at dinner. Her weight is 195 lb (88.5 kg) today and has had a weight loss of 2 pounds over a period of 2 weeks since her last visit. She has lost 2 lbs since starting treatment with [place].  Vitamin D deficiency [name] [name] has a diagnosis of vitamin D deficiency. She is currently taking Calcium-Carbonate-Vitamin D and denies nausea, vomiting or muscle weakness.  Diabetes II [name] [name] has a diagnosis of diabetes type II. [name] [name] states BGs range between 140 and 219 and 2 hour post prandial between 140 and 283, she denies any hypoglycemic episodes, A1c slowly increasing to 7.1 and is on Victoza at 1.2 mg  She has been working on intensive lifestyle modifications including diet, exercise, and weight loss to help control her blood glucose levels.  Non Alcoholic Fatty Liver Disease [name] [name]'s ALT continuing to increase slowly, she denies abdominal pain, no jaundice, this is a chronic problem.  At risk for cardiovascular disease [name] [name] is at a higher than average risk for cardiovascular disease due to obesity and diabetes. She currently denies any chest pain.   Wt Readings from Last 500 Encounters:  09/20/16 195 lb (88.5 kg)  09/05/16 197 lb (89.4 kg)  08/22/16 202 lb 3.2 oz (91.7 kg)  05/23/16 201 lb 3.2 oz (91.3 kg)  05/14/16 202 lb (91.6 kg)  02/15/16 197 lb 6.4 oz (89.5 kg)  11/16/15 193 lb 3.2 oz (87.6 kg)  08/18/15 198 lb 2 oz (89.9 kg)  05/09/15 200 lb (90.7 kg)  04/25/15 201 lb 3.2 oz (91.3 kg)  12/21/14 201 lb 6.4 oz (91.4 kg)  12/15/14 202 lb (91.6 kg)  05/04/14 195 lb 3.2 oz (88.5 kg)  04/29/14 193 lb (87.5 kg)  02/03/14 191 lb  (86.6 kg)  10/27/13 189 lb 0.6 oz (85.7 kg)  07/08/13 189 lb 0.6 oz (85.7 kg)  05/01/13 187 lb (84.8 kg)  03/31/13 190 lb 1.3 oz (86.2 kg)  12/23/12 186 lb 1.3 oz (84.4 kg)  09/26/12 180 lb (81.6 kg)  09/22/12 182 lb (82.6 kg)  07/15/12 173 lb (78.5 kg)  06/09/12 172 lb (78 kg)  05/28/12 176 lb 0.6 oz (79.9 kg)  04/24/12 174 lb 8 oz (79.2 kg)  03/05/12 170 lb 1.3 oz (77.1 kg)  11/29/11 171 lb (77.6 kg)  11/28/11 169 lb (76.7 kg)  10/05/11 172 lb (78 kg)  08/22/11 164 lb 1.3 oz (74.4 kg)  05/30/11 162 lb (73.5 kg)  05/09/11 166 lb 1.3 oz (75.3 kg)  04/27/11 164 lb 12.8 oz (74.8 kg)  03/21/11 164 lb (74.4 kg)  01/02/11 167 lb (75.8 kg)  07/27/10 169 lb 8 oz (76.9 kg)  04/06/10 176 lb 8 oz (80.1 kg)  01/03/10 177 lb 12 oz (80.6 kg)  01/03/10 176 lb (79.8 kg)  10/04/09 181 lb 8 oz (82.3 kg)  08/30/09 188 lb 4 oz (85.4 kg)  03/07/09 186 lb (84.4 kg)  01/11/09 181 lb (82.1 kg)  01/06/09 181 lb (82.1 kg)  08/05/08 180 lb (81.6 kg)  10/01/07 178 lb (80.7 kg)  06/19/06 170 lb (77.1 kg)     ALLERGIES: No Known  Allergies  MEDICATIONS: Current Outpatient Prescriptions on File Prior to Visit  Medication Sig Dispense Refill  . ARIPiprazole (ABILIFY) 5 MG tablet Take 5 mg by mouth daily.      Marland Kitchen aspirin 81 MG tablet Take 81 mg by mouth daily.      . BD PEN NEEDLE NANO U/F 32G X 4 MM MISC use once daily with INSULIN INJECTION 100 each 1  . buPROPion (WELLBUTRIN XL) 150 MG 24 hr tablet Take 450 mg by mouth daily.      . Calcium Carbonate-Vitamin D (CALTRATE 600+D) 600-400 MG-UNIT per tablet Take 1 tablet by mouth 2 (two) times daily.    Marland Kitchen glucose blood (ONE TOUCH ULTRA TEST) test strip TEST once daily 100 each 1  . hydrochlorothiazide (MICROZIDE) 12.5 MG capsule take 1 capsule by mouth once daily 30 capsule 2  . Lancets (ONETOUCH ULTRASOFT) lancets TEST BLOOD SUGAR ONCE DAILY 100 each 0  . liraglutide (VICTOZA) 18 MG/3ML SOPN inject 1.2 milligram subcutaneously every morning 3 mL 5    . metoprolol (LOPRESSOR) 50 MG tablet Take 1 tablet (50 mg total) by mouth 2 (two) times daily. 60 tablet 5  . Multiple Vitamins-Minerals (MULTIVITAMIN ADULTS 50+ PO) Take 1 tablet by mouth daily.    Marland Kitchen olmesartan-hydrochlorothiazide (BENICAR HCT) 20-12.5 MG tablet Take 0.5 tablets by mouth daily. 45 tablet 1  . ONE TOUCH ULTRA TEST test strip TEST once daily 100 each 3  . pantoprazole (PROTONIX) 40 MG tablet Take 1 tablet (40 mg total) by mouth daily. 30 tablet 5  . Probiotic Product (PROBIOTIC DAILY PO) Take 1 capsule by mouth daily.    . rosuvastatin (CRESTOR) 20 MG tablet take 1 tablet by mouth once daily 30 tablet 5  . terbinafine (LAMISIL) 250 MG tablet Take 1 tablet (250 mg total) by mouth daily. 30 tablet 0   No current facility-administered medications on file prior to visit.     PAST MEDICAL HISTORY: Past Medical History:  Diagnosis Date  . Allergic rhinitis, cause unspecified   . ASCUS on Pap smear   . Back pain   . CAD (coronary artery disease)   . Constipation   . Depression   . Depressive disorder, not elsewhere classified   . Esophageal reflux   . Fatty liver 04/26/2015  . GERD (gastroesophageal reflux disease)   . Hyperlipidemia   . Hypertension   . Obstructive sleep apnea (adult) (pediatric)   . Personal history of goiter    childhood  . Type II or unspecified type diabetes mellitus without mention of complication, not stated as uncontrolled     PAST SURGICAL HISTORY: Past Surgical History:  Procedure Laterality Date  . BUNIONECTOMY    . colposcopy  2012   cervical biopsy (Dr. Benjie Karvonen)  . CORONARY STENT PLACEMENT  2004  . goiter removal  1968  . TUBAL LIGATION  1985    SOCIAL HISTORY: Social History  Substance Use Topics  . Smoking status: Current Some Day Smoker    Years: 46.00    Types: E-cigarettes    Last attempt to quit: 09/23/2012  . Smokeless tobacco: Never Used     Comment: Uses vapor cigarette  . Alcohol use 0.0 oz/week     Comment:  occasional    FAMILY HISTORY: Family History  Problem Relation Age of Onset  . Cancer Mother     throat, cervical  . Diabetes Mother   . Stroke Mother   . Heart attack Mother   . Heart disease Mother   .  Hypertension Mother   . Thyroid disease Mother   . Alcoholism Mother   . Cancer Father     prostate  . Diabetes Sister   . Heart attack Sister   . Hypertension Sister   . Hyperlipidemia Sister   . Colon cancer Neg Hx     ROS: Review of Systems  Constitutional: Positive for weight loss.  Gastrointestinal: Negative for abdominal pain, nausea and vomiting.       Negative jaundice   Musculoskeletal:       Negative muscle weakness  Endo/Heme/Allergies:       Negative hypoglycemia    PHYSICAL EXAM: Blood pressure 125/84, pulse 61, temperature 97.5 F (36.4 C), temperature source Oral, height 5\' 3"  (1.6 m), weight 195 lb (88.5 kg), SpO2 95 %. Body mass index is 34.54 kg/m. Physical Exam  Constitutional: She is oriented to person, place, and time. She appears well-developed and well-nourished.  Cardiovascular: Normal rate.   Pulmonary/Chest: Effort normal.  Musculoskeletal: Normal range of motion.  Neurological: She is oriented to person, place, and time.  Skin: Skin is warm and dry.  Psychiatric: She has a normal mood and affect. Her behavior is normal.  Vitals reviewed.   RECENT LABS AND TESTS: BMET    Component Value Date/Time   NA 142 09/05/2016 1202   K 4.1 09/05/2016 1202   CL 101 09/05/2016 1202   CO2 26 09/05/2016 1202   GLUCOSE 129 (H) 09/05/2016 1202   GLUCOSE 159 (H) 08/22/2016 0906   BUN 14 09/05/2016 1202   CREATININE 0.77 09/05/2016 1202   CREATININE 0.70 12/15/2014 1949   CALCIUM 9.9 09/05/2016 1202   GFRNONAA 81 09/05/2016 1202   GFRNONAA 80 10/27/2013 0910   GFRAA 93 09/05/2016 1202   GFRAA >89 10/27/2013 0910   Lab Results  Component Value Date   HGBA1C 7.1 (H) 09/05/2016   HGBA1C 7.0 (H) 08/22/2016   HGBA1C 6.7 (H) 05/23/2016    HGBA1C 6.2 02/15/2016   HGBA1C 6.1 11/16/2015   Lab Results  Component Value Date   INSULIN 66.8 (H) 09/05/2016   CBC    Component Value Date/Time   WBC 5.1 09/05/2016 1202   WBC 4.6 12/21/2014 0956   RBC 4.75 09/05/2016 1202   RBC 4.54 12/21/2014 0956   HGB 13.4 12/21/2014 0956   HCT 42.1 09/05/2016 1202   PLT 159.0 12/21/2014 0956   MCV 89 09/05/2016 1202   MCH 28.0 09/05/2016 1202   MCH 29.2 05/09/2011 0931   MCHC 31.6 09/05/2016 1202   MCHC 34.0 12/21/2014 0956   RDW 15.7 (H) 09/05/2016 1202   LYMPHSABS 1.7 09/05/2016 1202   MONOABS 0.2 12/21/2014 0956   EOSABS 0.1 09/05/2016 1202   BASOSABS 0.0 09/05/2016 1202   Iron/TIBC/Ferritin/ %Sat    Component Value Date/Time   FERRITIN 37.3 12/29/2014 0856   Lipid Panel     Component Value Date/Time   CHOL 132 09/05/2016 1202   TRIG 139 09/05/2016 1202   HDL 53 09/05/2016 1202   CHOLHDL 2 05/23/2016 0957   VLDL 24.8 05/23/2016 0957   LDLCALC 51 09/05/2016 1202   LDLDIRECT 77.7 12/27/2009 0942   Hepatic Function Panel     Component Value Date/Time   PROT 6.9 09/05/2016 1202   ALBUMIN 4.3 09/05/2016 1202   AST 31 09/05/2016 1202   ALT 52 (H) 09/05/2016 1202   ALKPHOS 70 09/05/2016 1202   BILITOT 0.4 09/05/2016 1202   BILIDIR 0.1 08/22/2016 0906   IBILI 0.5 10/27/2013 0910  Component Value Date/Time   TSH 0.624 09/05/2016 1202   TSH 0.59 12/21/2014 0956   TSH 0.68 05/13/2014 0927    ASSESSMENT AND PLAN: Type 2 diabetes mellitus without complication, without long-term current use of insulin (HCC) - Plan: liraglutide (VICTOZA) 18 MG/3ML SOPN  NAFLD (nonalcoholic fatty liver disease)  Vitamin D deficiency  At risk for heart disease  Class 1 obesity without serious comorbidity with body mass index (BMI) of 34.0 to 34.9 in adult, unspecified obesity type  PLAN:  Vitamin D Deficiency [name] [name] was informed that low vitamin D levels contributes to fatigue and are associated with obesity, breast, and colon  cancer. She agrees to start to take prescription Vit D @50 ,000 IU every week #4 with no refills and will follow up for routine testing of vitamin D, at least 2-3 times per year. She was informed of the risk of over-replacement of vitamin D and agrees to not increase her dose unless he discusses this with [place] first. [name] [name] agrees to follow up with our clinic in 2 to 3 weeks.  Diabetes II [name] [name] has been given extensive diabetes education by myself today including ideal fasting and post-prandial blood glucose readings, individual ideal Hgb A1c goals  and hypoglycemia prevention. We discussed the importance of good blood sugar control to decrease the likelihood of diabetic complications such as nephropathy, neuropathy, limb loss, blindness, coronary artery disease, and death. We discussed the importance of intensive lifestyle modification including diet, exercise and weight loss as the first line treatment for diabetes. [name] [name] agrees to continue her diabetes medications and will follow up at the agreed upon time. [name] [name] agrees to increase Victoza to 1.8 mg every morning 3 pak with no refills (but [name] [name] to increase to 1.5 mg at this time until 2 week follow up)  Non Alcoholic Fatty Liver Disease [name] [name] advised, the only treatment for non alcoholic fatty liver disease is weight loss. [name] [name] agrees to continue with diet, exercise and weight loss and we will re-check labs in 3 months.  Cardiovascular risk counselling [name] [name] was given extended (at least 30 minutes) coronary artery disease prevention counseling today. She is 66 y.o. female and has risk factors for heart disease including obesity and diabetes. We discussed intensive lifestyle modifications today with an emphasis on specific weight loss instructions and strategies. Pt was also informed of the importance of increasing exercise and decreasing saturated fats to help prevent heart disease.  Obesity [name] [name] is currently in the action stage of change. As such,  her goal is to continue with weight loss efforts She has agreed to keep a food journal with 300 to 450 calories and 30 grams of protein at supper daily and follow the Category 2 plan +100 calories [name] [name] has been instructed to work up to a goal of 150 minutes of combined cardio and strengthening exercise per week for weight loss and overall health benefits. We discussed the following Behavioral Modification Stratagies today: increasing lean protein intake, increasing lower sugar fruits and work on meal planning and easy cooking plans  [name] [name] has agreed to follow up with our clinic in 2 to 3 weeks. She was informed of the importance of frequent follow up visits to maximize her success with intensive lifestyle modifications for her multiple health conditions.  I, Doreene Nest, am acting as scribe for Dennard Nip, MD  I have reviewed the above documentation for accuracy and completeness, and I agree with the above. -Dennard Nip, MD

## 2016-09-21 NOTE — Telephone Encounter (Signed)
[name] [name]-- please advise lamisil request.

## 2016-09-21 NOTE — Telephone Encounter (Signed)
Refill sent per [facility] refill protocol/SLS  

## 2016-09-24 ENCOUNTER — Other Ambulatory Visit: Payer: Self-pay | Admitting: Family

## 2016-09-26 ENCOUNTER — Encounter: Payer: Self-pay | Admitting: Family

## 2016-09-26 MED ORDER — TERBINAFINE HCL 250 MG PO TABS: 250.0000 mg | ORAL_TABLET | Freq: Every day | ORAL | 1 refills | Status: DC

## 2016-10-01 ENCOUNTER — Encounter (INDEPENDENT_AMBULATORY_CARE_PROVIDER_SITE_OTHER): Payer: Self-pay | Admitting: Family Medicine

## 2016-10-08 ENCOUNTER — Ambulatory Visit (INDEPENDENT_AMBULATORY_CARE_PROVIDER_SITE_OTHER): Payer: 59 | Admitting: Family Medicine

## 2016-10-08 VITALS — BP 156/81 | HR 65 | Temp 97.8°F | Ht 63.0 in | Wt 193.0 lb

## 2016-10-08 DIAGNOSIS — E669 Obesity, unspecified: Secondary | ICD-10-CM

## 2016-10-08 DIAGNOSIS — Z6834 Body mass index (BMI) 34.0-34.9, adult: Secondary | ICD-10-CM

## 2016-10-08 DIAGNOSIS — E119 Type 2 diabetes mellitus without complications: Secondary | ICD-10-CM

## 2016-10-08 MED ORDER — GLUCOSE BLOOD VI STRP: 1.0000 | ORAL_STRIP | Freq: Three times a day (TID) | 0 refills | Status: DC

## 2016-10-08 MED ORDER — LIRAGLUTIDE 18 MG/3ML ~~LOC~~ SOPN: 1.8000 mg | PEN_INJECTOR | Freq: Every day | SUBCUTANEOUS | 0 refills | Status: DC

## 2016-10-08 NOTE — Progress Notes (Signed)
Office: [phone]  /  Fax: [phone]   HPI:   Chief Complaint: OBESITY [name] [name] is here to discuss her progress with her obesity treatment plan. She is following her category 2 eating plan approximately 50 % of the time and states she is exercising 0 minutes 0 times per week. [name] [name] is following category 2 plan but is still deviating from dinner. She hasn't started journaling dinner yet as using the journaling app on her phone is somewhat confusing. Her weight is 193 lb (87.5 kg) today and has had a weight loss of 2 pounds over a period of 2 weeks since her last visit. She has lost 4 lbs since starting treatment with [place].  Diabetes II [name] has a diagnosis of diabetes type II. [name] is on Victoza 1/2 between 1.2 and 1.8, she denies nausea or vomiting and states BGs range between 160 and 180's even with following her eating plan and losing weight and she denies any hypoglycemic episodes. Last A1c was at 7.1 and our goal is 6.5 She has been working on intensive lifestyle modifications including diet, exercise, and weight loss to help control her blood glucose levels.  Wt Readings from Last 500 Encounters:  10/08/16 193 lb (87.5 kg)  09/20/16 195 lb (88.5 kg)  09/05/16 197 lb (89.4 kg)  08/22/16 202 lb 3.2 oz (91.7 kg)  05/23/16 201 lb 3.2 oz (91.3 kg)  05/14/16 202 lb (91.6 kg)  02/15/16 197 lb 6.4 oz (89.5 kg)  11/16/15 193 lb 3.2 oz (87.6 kg)  08/18/15 198 lb 2 oz (89.9 kg)  05/09/15 200 lb (90.7 kg)  04/25/15 201 lb 3.2 oz (91.3 kg)  12/21/14 201 lb 6.4 oz (91.4 kg)  12/15/14 202 lb (91.6 kg)  05/04/14 195 lb 3.2 oz (88.5 kg)  04/29/14 193 lb (87.5 kg)  02/03/14 191 lb (86.6 kg)  10/27/13 189 lb 0.6 oz (85.7 kg)  07/08/13 189 lb 0.6 oz (85.7 kg)  05/01/13 187 lb (84.8 kg)  03/31/13 190 lb 1.3 oz (86.2 kg)  12/23/12 186 lb 1.3 oz (84.4 kg)  09/26/12 180 lb (81.6 kg)  09/22/12 182 lb (82.6 kg)  07/15/12 173 lb (78.5 kg)  06/09/12 172 lb (78 kg)  05/28/12 176 lb 0.6 oz (79.9  kg)  04/24/12 174 lb 8 oz (79.2 kg)  03/05/12 170 lb 1.3 oz (77.1 kg)  11/29/11 171 lb (77.6 kg)  11/28/11 169 lb (76.7 kg)  10/05/11 172 lb (78 kg)  08/22/11 164 lb 1.3 oz (74.4 kg)  05/30/11 162 lb (73.5 kg)  05/09/11 166 lb 1.3 oz (75.3 kg)  04/27/11 164 lb 12.8 oz (74.8 kg)  03/21/11 164 lb (74.4 kg)  01/02/11 167 lb (75.8 kg)  07/27/10 169 lb 8 oz (76.9 kg)  04/06/10 176 lb 8 oz (80.1 kg)  01/03/10 177 lb 12 oz (80.6 kg)  01/03/10 176 lb (79.8 kg)  10/04/09 181 lb 8 oz (82.3 kg)  08/30/09 188 lb 4 oz (85.4 kg)  03/07/09 186 lb (84.4 kg)  01/11/09 181 lb (82.1 kg)  01/06/09 181 lb (82.1 kg)  08/05/08 180 lb (81.6 kg)  10/01/07 178 lb (80.7 kg)  06/19/06 170 lb (77.1 kg)     ALLERGIES: No Known Allergies  MEDICATIONS: Current Outpatient Prescriptions on File Prior to Visit  Medication Sig Dispense Refill  . ARIPiprazole (ABILIFY) 5 MG tablet Take 5 mg by mouth daily.      Marland Kitchen aspirin 81 MG tablet Take 81 mg by mouth daily.      Marland Kitchen  BD PEN NEEDLE NANO U/F 32G X 4 MM MISC use once daily with INSULIN INJECTION 100 each 1  . buPROPion (WELLBUTRIN XL) 150 MG 24 hr tablet Take 450 mg by mouth daily.      . Calcium Carbonate-Vitamin D (CALTRATE 600+D) 600-400 MG-UNIT per tablet Take 1 tablet by mouth 2 (two) times daily.    Marland Kitchen glucose blood (ONE TOUCH ULTRA TEST) test strip TEST once daily 100 each 1  . hydrochlorothiazide (MICROZIDE) 12.5 MG capsule take 1 capsule by mouth once daily 30 capsule 2  . Lancets (ONETOUCH ULTRASOFT) lancets TEST BLOOD SUGAR ONCE DAILY 100 each 0  . metoprolol (LOPRESSOR) 50 MG tablet take 1 tablet by mouth twice a day 60 tablet 2  . Multiple Vitamins-Minerals (MULTIVITAMIN ADULTS 50+ PO) Take 1 tablet by mouth daily.    Marland Kitchen olmesartan-hydrochlorothiazide (BENICAR HCT) 20-12.5 MG tablet Take 0.5 tablets by mouth daily. 45 tablet 1  . ONE TOUCH ULTRA TEST test strip TEST once daily 100 each 3  . pantoprazole (PROTONIX) 40 MG tablet Take 1 tablet (40 mg  total) by mouth daily. 30 tablet 5  . Probiotic Product (PROBIOTIC DAILY PO) Take 1 capsule by mouth daily.    . rosuvastatin (CRESTOR) 20 MG tablet take 1 tablet by mouth once daily 30 tablet 5  . terbinafine (LAMISIL) 250 MG tablet Take 1 tablet (250 mg total) by mouth daily. 30 tablet 1  . Vitamin D, Ergocalciferol, (DRISDOL) 50000 units CAPS capsule Take 1 capsule (50,000 Units total) by mouth every 7 (seven) days. 4 capsule 0   No current facility-administered medications on file prior to visit.     PAST MEDICAL HISTORY: Past Medical History:  Diagnosis Date  . Allergic rhinitis, cause unspecified   . ASCUS on Pap smear   . Back pain   . CAD (coronary artery disease)   . Constipation   . Depression   . Depressive disorder, not elsewhere classified   . Esophageal reflux   . Fatty liver 04/26/2015  . GERD (gastroesophageal reflux disease)   . Hyperlipidemia   . Hypertension   . Obstructive sleep apnea (adult) (pediatric)   . Personal history of goiter    childhood  . Type II or unspecified type diabetes mellitus without mention of complication, not stated as uncontrolled     PAST SURGICAL HISTORY: Past Surgical History:  Procedure Laterality Date  . BUNIONECTOMY    . colposcopy  2012   cervical biopsy (Dr. Benjie Karvonen)  . CORONARY STENT PLACEMENT  2004  . goiter removal  1968  . TUBAL LIGATION  1985    SOCIAL HISTORY: Social History  Substance Use Topics  . Smoking status: Current Some Day Smoker    Years: 46.00    Types: E-cigarettes    Last attempt to quit: 09/23/2012  . Smokeless tobacco: Never Used     Comment: Uses vapor cigarette  . Alcohol use 0.0 oz/week     Comment: occasional    FAMILY HISTORY: Family History  Problem Relation Age of Onset  . Cancer Mother     throat, cervical  . Diabetes Mother   . Stroke Mother   . Heart attack Mother   . Heart disease Mother   . Hypertension Mother   . Thyroid disease Mother   . Alcoholism Mother   . Cancer  Father     prostate  . Diabetes Sister   . Heart attack Sister   . Hypertension Sister   . Hyperlipidemia Sister   .  Colon cancer Neg Hx     ROS: Review of Systems  Constitutional: Positive for weight loss.  Endo/Heme/Allergies:       Negative hypoglycemia    PHYSICAL EXAM: Blood pressure (!) 156/81, pulse 65, temperature 97.8 F (36.6 C), temperature source Oral, height 5\' 3"  (1.6 m), weight 193 lb (87.5 kg), SpO2 96 %. Body mass index is 34.19 kg/m. Physical Exam  Constitutional: She is oriented to person, place, and time. She appears well-developed and well-nourished.  Cardiovascular: Normal rate.   Pulmonary/Chest: Effort normal.  Musculoskeletal: Normal range of motion.  Neurological: She is oriented to person, place, and time.  Skin: Skin is warm and dry.  Psychiatric: She has a normal mood and affect. Her behavior is normal.  Vitals reviewed.   RECENT LABS AND TESTS: BMET    Component Value Date/Time   NA 142 09/05/2016 1202   K 4.1 09/05/2016 1202   CL 101 09/05/2016 1202   CO2 26 09/05/2016 1202   GLUCOSE 129 (H) 09/05/2016 1202   GLUCOSE 159 (H) 08/22/2016 0906   BUN 14 09/05/2016 1202   CREATININE 0.77 09/05/2016 1202   CREATININE 0.70 12/15/2014 1949   CALCIUM 9.9 09/05/2016 1202   GFRNONAA 81 09/05/2016 1202   GFRNONAA 80 10/27/2013 0910   GFRAA 93 09/05/2016 1202   GFRAA >89 10/27/2013 0910   Lab Results  Component Value Date   HGBA1C 7.1 (H) 09/05/2016   HGBA1C 7.0 (H) 08/22/2016   HGBA1C 6.7 (H) 05/23/2016   HGBA1C 6.2 02/15/2016   HGBA1C 6.1 11/16/2015   Lab Results  Component Value Date   INSULIN 66.8 (H) 09/05/2016   CBC    Component Value Date/Time   WBC 5.1 09/05/2016 1202   WBC 4.6 12/21/2014 0956   RBC 4.75 09/05/2016 1202   RBC 4.54 12/21/2014 0956   HGB 13.4 12/21/2014 0956   HCT 42.1 09/05/2016 1202   PLT 159.0 12/21/2014 0956   MCV 89 09/05/2016 1202   MCH 28.0 09/05/2016 1202   MCH 29.2 05/09/2011 0931   MCHC 31.6  09/05/2016 1202   MCHC 34.0 12/21/2014 0956   RDW 15.7 (H) 09/05/2016 1202   LYMPHSABS 1.7 09/05/2016 1202   MONOABS 0.2 12/21/2014 0956   EOSABS 0.1 09/05/2016 1202   BASOSABS 0.0 09/05/2016 1202   Iron/TIBC/Ferritin/ %Sat    Component Value Date/Time   FERRITIN 37.3 12/29/2014 0856   Lipid Panel     Component Value Date/Time   CHOL 132 09/05/2016 1202   TRIG 139 09/05/2016 1202   HDL 53 09/05/2016 1202   CHOLHDL 2 05/23/2016 0957   VLDL 24.8 05/23/2016 0957   LDLCALC 51 09/05/2016 1202   LDLDIRECT 77.7 12/27/2009 0942   Hepatic Function Panel     Component Value Date/Time   PROT 7.1 09/20/2016 0927   PROT 6.9 09/05/2016 1202   ALBUMIN 4.3 09/20/2016 0927   ALBUMIN 4.3 09/05/2016 1202   AST 25 09/20/2016 0927   ALT 45 (H) 09/20/2016 0927   ALKPHOS 57 09/20/2016 0927   BILITOT 0.5 09/20/2016 0927   BILITOT 0.4 09/05/2016 1202   BILIDIR 0.1 09/20/2016 0927   IBILI 0.5 10/27/2013 0910      Component Value Date/Time   TSH 0.624 09/05/2016 1202   TSH 0.59 12/21/2014 0956   TSH 0.68 05/13/2014 0927    ASSESSMENT AND PLAN: Type 2 diabetes mellitus without complication, without long-term current use of insulin ([place]) - Plan: glucose blood (ONE TOUCH ULTRA TEST) test strip, liraglutide 18 MG/3ML SOPN  Class 1 obesity without serious comorbidity with body mass index (BMI) of 34.0 to 34.9 in adult, unspecified obesity type  PLAN:  Diabetes II Timothea has been given extensive diabetes education by myself today including ideal fasting and post-prandial blood glucose readings, individual ideal Hgb A1c goals  and hypoglycemia prevention. We discussed the importance of good blood sugar control to decrease the likelihood of diabetic complications such as nephropathy, neuropathy, limb loss, blindness, coronary artery disease, and death. We discussed the importance of intensive lifestyle modification including diet, exercise and weight loss as the first line treatment for diabetes.  Kortni agrees to increase Victoza to 1.8 mg qd  #3 pen with no refills and strips to test tid #100 with no refills and will follow up at the agreed upon time.  Obesity Aitanna is currently in the action stage of change. As such, her goal is to continue with weight loss efforts She has agreed to keep a food journal with 350 to 500 calories and 30+ grams of protein daily and follow the Category 2 plan Mabell has been instructed to work up to a goal of 150 minutes of combined cardio and strengthening exercise per week. She will start with using exercise bands during commercials for 1 hour long TV show per day for weight loss and overall health benefits. We discussed the following Behavioral Modification Stratagies today: increasing lean protein intake and back to weigh and measure food. Briseis will work with our bariatric registered dietician Hoyle Sauer to get more comfortable using technology to help her meet her nutrition goals.  Emaleigh has agreed to follow up with our clinic in 2 weeks. She was informed of the importance of frequent follow up visits to maximize her success with intensive lifestyle modifications for her multiple health conditions.  I, Doreene Nest, am acting as scribe for Dennard Nip, MD  I have reviewed the above documentation for accuracy and completeness, and I agree with the above. -Dennard Nip, MD

## 2016-10-09 ENCOUNTER — Encounter (INDEPENDENT_AMBULATORY_CARE_PROVIDER_SITE_OTHER): Payer: Self-pay | Admitting: Family Medicine

## 2016-10-23 ENCOUNTER — Ambulatory Visit (INDEPENDENT_AMBULATORY_CARE_PROVIDER_SITE_OTHER): Payer: 59 | Admitting: Family Medicine

## 2016-10-24 ENCOUNTER — Ambulatory Visit (INDEPENDENT_AMBULATORY_CARE_PROVIDER_SITE_OTHER): Payer: 59 | Admitting: Family Medicine

## 2016-10-24 VITALS — BP 161/69 | HR 57 | Temp 98.0°F | Ht 63.0 in | Wt 194.0 lb

## 2016-10-24 DIAGNOSIS — E669 Obesity, unspecified: Secondary | ICD-10-CM

## 2016-10-24 DIAGNOSIS — Z6834 Body mass index (BMI) 34.0-34.9, adult: Secondary | ICD-10-CM

## 2016-10-24 DIAGNOSIS — E559 Vitamin D deficiency, unspecified: Secondary | ICD-10-CM | POA: Diagnosis not present

## 2016-10-24 DIAGNOSIS — E119 Type 2 diabetes mellitus without complications: Secondary | ICD-10-CM

## 2016-10-24 MED ORDER — VITAMIN D (ERGOCALCIFEROL) 1.25 MG (50000 UNIT) PO CAPS: 50000.0000 [IU] | ORAL_CAPSULE | ORAL | 0 refills | Status: DC

## 2016-10-24 NOTE — Progress Notes (Signed)
Office: [phone]  /  Fax: [phone]   HPI:   Chief Complaint: OBESITY [name] [name] is here to discuss her progress with her obesity treatment plan. She is on the  follow the Category 2 plan and is following her eating plan approximately 70 % of the time. She states she is exercising 10 to 20 minutes 3 to 4 times per week. [name] [name] had increased celebration eating over Mother's Day and is getting bored with her plan. She would like more options and doesn't like to eat very much meat. Her weight is 194 lb (88 kg) today and has had a weight gain of 1 pound over a period of 2 weeks since her last visit. She has lost 3 lbs since starting treatment with [place].  Vitamin D deficiency [name] [name] has a diagnosis of vitamin D deficiency. She is currently on taking vit D, not yet at goal and denies nausea, vomiting or muscle weakness.  Diabetes II [name] [name] has a diagnosis of diabetes type II. [name] [name] states BGs range between 178 and 181 and 2 hour post prandial between 136 and 287. [name] [name] is on increased dose of Victoza of 1.8 mg and last A1c was at 7.1 and she denies any hypoglycemic episodes, nausea or vomiting. She hasn't followed diet prescription as closely in the last 2 weeks.  [name] [name] has been working on intensive lifestyle modifications including diet, exercise, and weight loss to help control her blood glucose levels.   ALLERGIES: No Known Allergies  MEDICATIONS: Current Outpatient Prescriptions on File Prior to Visit  Medication Sig Dispense Refill  . ARIPiprazole (ABILIFY) 5 MG tablet Take 5 mg by mouth daily.      Marland Kitchen aspirin 81 MG tablet Take 81 mg by mouth daily.      . BD PEN NEEDLE NANO U/F 32G X 4 MM MISC use once daily with INSULIN INJECTION 100 each 1  . buPROPion (WELLBUTRIN XL) 150 MG 24 hr tablet Take 450 mg by mouth daily.      . Calcium Carbonate-Vitamin D (CALTRATE 600+D) 600-400 MG-UNIT per tablet Take 1 tablet by mouth 2 (two) times daily.    Marland Kitchen glucose blood (ONE TOUCH ULTRA TEST) test  strip TEST once daily 100 each 1  . glucose blood (ONE TOUCH ULTRA TEST) test strip 1 each by Other route 3 (three) times daily. for testing 100 each 0  . hydrochlorothiazide (MICROZIDE) 12.5 MG capsule take 1 capsule by mouth once daily 30 capsule 2  . Lancets (ONETOUCH ULTRASOFT) lancets TEST BLOOD SUGAR ONCE DAILY 100 each 0  . liraglutide 18 MG/3ML SOPN Inject 0.3 mLs (1.8 mg total) into the skin daily. 3 pen 0  . metoprolol (LOPRESSOR) 50 MG tablet take 1 tablet by mouth twice a day 60 tablet 2  . Multiple Vitamins-Minerals (MULTIVITAMIN ADULTS 50+ PO) Take 1 tablet by mouth daily.    Marland Kitchen olmesartan-hydrochlorothiazide (BENICAR HCT) 20-12.5 MG tablet Take 0.5 tablets by mouth daily. 45 tablet 1  . pantoprazole (PROTONIX) 40 MG tablet Take 1 tablet (40 mg total) by mouth daily. 30 tablet 5  . Probiotic Product (PROBIOTIC DAILY PO) Take 1 capsule by mouth daily.    . rosuvastatin (CRESTOR) 20 MG tablet take 1 tablet by mouth once daily 30 tablet 5  . terbinafine (LAMISIL) 250 MG tablet Take 1 tablet (250 mg total) by mouth daily. 30 tablet 1   No current facility-administered medications on file prior to visit.     PAST MEDICAL HISTORY: Past Medical History:  Diagnosis Date  .  Allergic rhinitis, cause unspecified   . ASCUS on Pap smear   . Back pain   . CAD (coronary artery disease)   . Constipation   . Depression   . Depressive disorder, not elsewhere classified   . Esophageal reflux   . Fatty liver 04/26/2015  . GERD (gastroesophageal reflux disease)   . Hyperlipidemia   . Hypertension   . Obstructive sleep apnea (adult) (pediatric)   . Personal history of goiter    childhood  . Type II or unspecified type diabetes mellitus without mention of complication, not stated as uncontrolled     PAST SURGICAL HISTORY: Past Surgical History:  Procedure Laterality Date  . BUNIONECTOMY    . colposcopy  2012   cervical biopsy (Dr. Benjie Karvonen)  . CORONARY STENT PLACEMENT  2004  . goiter  removal  1968  . TUBAL LIGATION  1985    SOCIAL HISTORY: Social History  Substance Use Topics  . Smoking status: Current Some Day Smoker    Years: 46.00    Types: E-cigarettes    Last attempt to quit: 09/23/2012  . Smokeless tobacco: Never Used     Comment: Uses vapor cigarette  . Alcohol use 0.0 oz/week     Comment: occasional    FAMILY HISTORY: Family History  Problem Relation Age of Onset  . Cancer Mother        throat, cervical  . Diabetes Mother   . Stroke Mother   . Heart attack Mother   . Heart disease Mother   . Hypertension Mother   . Thyroid disease Mother   . Alcoholism Mother   . Cancer Father        prostate  . Diabetes Sister   . Heart attack Sister   . Hypertension Sister   . Hyperlipidemia Sister   . Colon cancer Neg Hx     ROS: Review of Systems  Constitutional: Negative for weight loss.  Gastrointestinal: Negative for nausea and vomiting.  Musculoskeletal:       Negative muscle weakness  Endo/Heme/Allergies:       Negative hypoglycemia    PHYSICAL EXAM: Blood pressure (!) 161/69, pulse (!) 57, temperature 98 F (36.7 C), temperature source Oral, height 5\' 3"  (1.6 m), weight 194 lb (88 kg), SpO2 97 %. Body mass index is 34.37 kg/m. Physical Exam  Constitutional: She is oriented to person, place, and time. She appears well-developed and well-nourished.  Cardiovascular: Normal rate.   Pulmonary/Chest: Effort normal.  Musculoskeletal: Normal range of motion.  Neurological: She is oriented to person, place, and time.  Skin: Skin is warm and dry.  Psychiatric: She has a normal mood and affect. Her behavior is normal.  Vitals reviewed.   RECENT LABS AND TESTS: BMET    Component Value Date/Time   NA 142 09/05/2016 1202   K 4.1 09/05/2016 1202   CL 101 09/05/2016 1202   CO2 26 09/05/2016 1202   GLUCOSE 129 (H) 09/05/2016 1202   GLUCOSE 159 (H) 08/22/2016 0906   BUN 14 09/05/2016 1202   CREATININE 0.77 09/05/2016 1202   CREATININE  0.70 12/15/2014 1949   CALCIUM 9.9 09/05/2016 1202   GFRNONAA 81 09/05/2016 1202   GFRNONAA 80 10/27/2013 0910   GFRAA 93 09/05/2016 1202   GFRAA >89 10/27/2013 0910   Lab Results  Component Value Date   HGBA1C 7.1 (H) 09/05/2016   HGBA1C 7.0 (H) 08/22/2016   HGBA1C 6.7 (H) 05/23/2016   HGBA1C 6.2 02/15/2016   HGBA1C 6.1 11/16/2015  Lab Results  Component Value Date   INSULIN 66.8 (H) 09/05/2016   CBC    Component Value Date/Time   WBC 5.1 09/05/2016 1202   WBC 4.6 12/21/2014 0956   RBC 4.75 09/05/2016 1202   RBC 4.54 12/21/2014 0956   HGB 13.4 12/21/2014 0956   HCT 42.1 09/05/2016 1202   PLT 159.0 12/21/2014 0956   MCV 89 09/05/2016 1202   MCH 28.0 09/05/2016 1202   MCH 29.2 05/09/2011 0931   MCHC 31.6 09/05/2016 1202   MCHC 34.0 12/21/2014 0956   RDW 15.7 (H) 09/05/2016 1202   LYMPHSABS 1.7 09/05/2016 1202   MONOABS 0.2 12/21/2014 0956   EOSABS 0.1 09/05/2016 1202   BASOSABS 0.0 09/05/2016 1202   Iron/TIBC/Ferritin/ %Sat    Component Value Date/Time   FERRITIN 37.3 12/29/2014 0856   Lipid Panel     Component Value Date/Time   CHOL 132 09/05/2016 1202   TRIG 139 09/05/2016 1202   HDL 53 09/05/2016 1202   CHOLHDL 2 05/23/2016 0957   VLDL 24.8 05/23/2016 0957   LDLCALC 51 09/05/2016 1202   LDLDIRECT 77.7 12/27/2009 0942   Hepatic Function Panel     Component Value Date/Time   PROT 7.1 09/20/2016 0927   PROT 6.9 09/05/2016 1202   ALBUMIN 4.3 09/20/2016 0927   ALBUMIN 4.3 09/05/2016 1202   AST 25 09/20/2016 0927   ALT 45 (H) 09/20/2016 0927   ALKPHOS 57 09/20/2016 0927   BILITOT 0.5 09/20/2016 0927   BILITOT 0.4 09/05/2016 1202   BILIDIR 0.1 09/20/2016 0927   IBILI 0.5 10/27/2013 0910      Component Value Date/Time   TSH 0.624 09/05/2016 1202   TSH 0.59 12/21/2014 0956   TSH 0.68 05/13/2014 0927    ASSESSMENT AND PLAN: Type 2 diabetes mellitus without complication, unspecified whether long term insulin use (HCC)  Vitamin D deficiency -  Plan: Vitamin D, Ergocalciferol, (DRISDOL) 50000 units CAPS capsule  Class 1 obesity without serious comorbidity with body mass index (BMI) of 34.0 to 34.9 in adult, unspecified obesity type  PLAN:  Vitamin D Deficiency [name] [name] was informed that low vitamin D levels contributes to fatigue and are associated with obesity, breast, and colon cancer. She agrees to continue to take prescription Vit D @50 ,000 IU every week #4 with no refills and will follow up for routine testing of vitamin D, at least 2-3 times per year. She was informed of the risk of over-replacement of vitamin D and agrees to not increase her dose unless he discusses this with [place] first. [name] [name] agrees to follow up with our clinic in 2 weeks.  Diabetes II [name] [name] has been given extensive diabetes education by myself today including ideal fasting and post-prandial blood glucose readings, individual ideal Hgb A1c goals  and hypoglycemia prevention. We discussed the importance of good blood sugar control to decrease the likelihood of diabetic complications such as nephropathy, neuropathy, limb loss, blindness, coronary artery disease, and death. We discussed the importance of intensive lifestyle modification including diet, exercise and weight loss as the first line treatment for diabetes. [name] [name] agrees to continue Victoza and get back to strict diet prescription with A1c goal of 6.5 and will follow up at the agreed upon time.  Obesity [name] [name] is currently in the action stage of change. As such, her goal is to continue with weight loss efforts She has agreed to change to follow our protein rich vegetarian plan [name] [name] has been instructed to work up to a goal of 150 minutes  of combined cardio and strengthening exercise per week or continue exercising with band for 10 to 20 minutes 3 times per week for weight loss and overall health benefits. We discussed the following Behavioral Modification Stratgies today: increasing H2O, increasing lean protein  intake and increasing fiber rich foods  [name] [name] has agreed to follow up with our clinic in 2 weeks. She was informed of the importance of frequent follow up visits to maximize her success with intensive lifestyle modifications for her multiple health conditions.  I, Doreene Nest, am acting as scribe for Dennard Nip, MD  I have reviewed the above documentation for accuracy and completeness, and I agree with the above. -Dennard Nip, MD

## 2016-11-03 ENCOUNTER — Other Ambulatory Visit: Payer: Self-pay | Admitting: Family

## 2016-11-07 ENCOUNTER — Ambulatory Visit (INDEPENDENT_AMBULATORY_CARE_PROVIDER_SITE_OTHER): Payer: 59 | Admitting: Family Medicine

## 2016-11-07 VITALS — BP 131/81 | HR 68 | Temp 98.1°F | Ht 63.0 in | Wt 195.0 lb

## 2016-11-07 DIAGNOSIS — E669 Obesity, unspecified: Secondary | ICD-10-CM

## 2016-11-07 DIAGNOSIS — E119 Type 2 diabetes mellitus without complications: Secondary | ICD-10-CM

## 2016-11-07 DIAGNOSIS — Z6834 Body mass index (BMI) 34.0-34.9, adult: Secondary | ICD-10-CM

## 2016-11-07 DIAGNOSIS — I10 Essential (primary) hypertension: Secondary | ICD-10-CM | POA: Diagnosis not present

## 2016-11-07 MED ORDER — LIRAGLUTIDE 18 MG/3ML ~~LOC~~ SOPN: 1.8000 mg | PEN_INJECTOR | Freq: Every day | SUBCUTANEOUS | 0 refills | Status: DC

## 2016-11-07 NOTE — Progress Notes (Signed)
Office: [phone]  /  Fax: [phone]   HPI:   Chief Complaint: OBESITY [name] [name] is here to discuss her progress with her obesity treatment plan. She is on the  follow our protein rich vegetarian plan and is following her eating plan approximately 10 % of the time. She states she is exercising 0 minutes 0 times per week. [name] [name]  Is struggling to follow plan, states she is deviating and has hard time planning ahead. Her weight is 195 lb (88.5 kg) today and has gained 1 pound over a period of 2 weeks since her last visit. She has lost 2 lbs since starting treatment with [place].  Diabetes II [name] [name] has a diagnosis of diabetes type II. [name] [name] states BGs range between 140's and 200's and denies any hypoglycemic episodes. She has been working on intensive lifestyle modifications including diet, exercise, and weight loss to help control her blood glucose levels.  Hypertension [name] is a 66 y.o. female with hypertension.  Iven [name] denies chest pain, headache or shortness of breath on exertion. She is working weight loss to help control her blood pressure with the goal of decreasing her risk of heart attack and stroke. Debras blood pressure is currently controlled.   ALLERGIES: No Known Allergies  MEDICATIONS: Current Outpatient Prescriptions on File Prior to Visit  Medication Sig Dispense Refill  . ARIPiprazole (ABILIFY) 5 MG tablet Take 5 mg by mouth daily.      Marland Kitchen aspirin 81 MG tablet Take 81 mg by mouth daily.      . BD PEN NEEDLE NANO U/F 32G X 4 MM MISC USE ONCE DAILY WITH INSULIN INJECTION 100 each 1  . buPROPion (WELLBUTRIN XL) 150 MG 24 hr tablet Take 450 mg by mouth daily.      . Calcium Carbonate-Vitamin D (CALTRATE 600+D) 600-400 MG-UNIT per tablet Take 1 tablet by mouth 2 (two) times daily.    Marland Kitchen glucose blood (ONE TOUCH ULTRA TEST) test strip TEST once daily 100 each 1  . glucose blood (ONE TOUCH ULTRA TEST) test strip 1 each by Other route 3 (three) times daily. for testing  100 each 0  . hydrochlorothiazide (MICROZIDE) 12.5 MG capsule take 1 capsule by mouth once daily 30 capsule 2  . Lancets (ONETOUCH ULTRASOFT) lancets TEST BLOOD SUGAR ONCE DAILY 100 each 0  . metoprolol (LOPRESSOR) 50 MG tablet take 1 tablet by mouth twice a day 60 tablet 2  . Multiple Vitamins-Minerals (MULTIVITAMIN ADULTS 50+ PO) Take 1 tablet by mouth daily.    Marland Kitchen olmesartan-hydrochlorothiazide (BENICAR HCT) 20-12.5 MG tablet Take 0.5 tablets by mouth daily. 45 tablet 1  . pantoprazole (PROTONIX) 40 MG tablet Take 1 tablet (40 mg total) by mouth daily. 30 tablet 5  . Probiotic Product (PROBIOTIC DAILY PO) Take 1 capsule by mouth daily.    . rosuvastatin (CRESTOR) 20 MG tablet take 1 tablet by mouth once daily 30 tablet 5  . terbinafine (LAMISIL) 250 MG tablet Take 1 tablet (250 mg total) by mouth daily. 30 tablet 1  . Vitamin D, Ergocalciferol, (DRISDOL) 50000 units CAPS capsule Take 1 capsule (50,000 Units total) by mouth every 7 (seven) days. 4 capsule 0   No current facility-administered medications on file prior to visit.     PAST MEDICAL HISTORY: Past Medical History:  Diagnosis Date  . Allergic rhinitis, cause unspecified   . ASCUS on Pap smear   . Back pain   . CAD (coronary artery disease)   . Constipation   .  Depression   . Depressive disorder, not elsewhere classified   . Esophageal reflux   . Fatty liver 04/26/2015  . GERD (gastroesophageal reflux disease)   . Hyperlipidemia   . Hypertension   . Obstructive sleep apnea (adult) (pediatric)   . Personal history of goiter    childhood  . Type II or unspecified type diabetes mellitus without mention of complication, not stated as uncontrolled     PAST SURGICAL HISTORY: Past Surgical History:  Procedure Laterality Date  . BUNIONECTOMY    . colposcopy  2012   cervical biopsy (Dr. Benjie Karvonen)  . CORONARY STENT PLACEMENT  2004  . goiter removal  1968  . TUBAL LIGATION  1985    SOCIAL HISTORY: Social History  Substance  Use Topics  . Smoking status: Current Some Day Smoker    Years: 46.00    Types: E-cigarettes    Last attempt to quit: 09/23/2012  . Smokeless tobacco: Never Used     Comment: Uses vapor cigarette  . Alcohol use 0.0 oz/week     Comment: occasional    FAMILY HISTORY: Family History  Problem Relation Age of Onset  . Cancer Mother        throat, cervical  . Diabetes Mother   . Stroke Mother   . Heart attack Mother   . Heart disease Mother   . Hypertension Mother   . Thyroid disease Mother   . Alcoholism Mother   . Cancer Father        prostate  . Diabetes Sister   . Heart attack Sister   . Hypertension Sister   . Hyperlipidemia Sister   . Colon cancer Neg Hx     ROS: Review of Systems  Constitutional: Negative for weight loss.  Respiratory: Negative for shortness of breath (on exertion).   Cardiovascular: Negative for chest pain.  Neurological: Negative for headaches.  Endo/Heme/Allergies:       Negative hypoglycemia    PHYSICAL EXAM: Blood pressure 131/81, pulse 68, temperature 98.1 F (36.7 C), temperature source Oral, height 5\' 3"  (1.6 m), weight 195 lb (88.5 kg), SpO2 95 %. Body mass index is 34.54 kg/m. Physical Exam  Constitutional: She is oriented to person, place, and time. She appears well-developed and well-nourished.  Cardiovascular: Normal rate.   Pulmonary/Chest: Effort normal.  Musculoskeletal: Normal range of motion.  Neurological: She is oriented to person, place, and time.  Skin: Skin is warm and dry.  Psychiatric: She has a normal mood and affect. Her behavior is normal.  Vitals reviewed.   RECENT LABS AND TESTS: BMET    Component Value Date/Time   NA 142 09/05/2016 1202   K 4.1 09/05/2016 1202   CL 101 09/05/2016 1202   CO2 26 09/05/2016 1202   GLUCOSE 129 (H) 09/05/2016 1202   GLUCOSE 159 (H) 08/22/2016 0906   BUN 14 09/05/2016 1202   CREATININE 0.77 09/05/2016 1202   CREATININE 0.70 12/15/2014 1949   CALCIUM 9.9 09/05/2016 1202    GFRNONAA 81 09/05/2016 1202   GFRNONAA 80 10/27/2013 0910   GFRAA 93 09/05/2016 1202   GFRAA >89 10/27/2013 0910   Lab Results  Component Value Date   HGBA1C 7.1 (H) 09/05/2016   HGBA1C 7.0 (H) 08/22/2016   HGBA1C 6.7 (H) 05/23/2016   HGBA1C 6.2 02/15/2016   HGBA1C 6.1 11/16/2015   Lab Results  Component Value Date   INSULIN 66.8 (H) 09/05/2016   CBC    Component Value Date/Time   WBC 5.1 09/05/2016 1202  WBC 4.6 12/21/2014 0956   RBC 4.75 09/05/2016 1202   RBC 4.54 12/21/2014 0956   HGB 13.4 12/21/2014 0956   HCT 42.1 09/05/2016 1202   PLT 159.0 12/21/2014 0956   MCV 89 09/05/2016 1202   MCH 28.0 09/05/2016 1202   MCH 29.2 05/09/2011 0931   MCHC 31.6 09/05/2016 1202   MCHC 34.0 12/21/2014 0956   RDW 15.7 (H) 09/05/2016 1202   LYMPHSABS 1.7 09/05/2016 1202   MONOABS 0.2 12/21/2014 0956   EOSABS 0.1 09/05/2016 1202   BASOSABS 0.0 09/05/2016 1202   Iron/TIBC/Ferritin/ %Sat    Component Value Date/Time   FERRITIN 37.3 12/29/2014 0856   Lipid Panel     Component Value Date/Time   CHOL 132 09/05/2016 1202   TRIG 139 09/05/2016 1202   HDL 53 09/05/2016 1202   CHOLHDL 2 05/23/2016 0957   VLDL 24.8 05/23/2016 0957   LDLCALC 51 09/05/2016 1202   LDLDIRECT 77.7 12/27/2009 0942   Hepatic Function Panel     Component Value Date/Time   PROT 7.1 09/20/2016 0927   PROT 6.9 09/05/2016 1202   ALBUMIN 4.3 09/20/2016 0927   ALBUMIN 4.3 09/05/2016 1202   AST 25 09/20/2016 0927   ALT 45 (H) 09/20/2016 0927   ALKPHOS 57 09/20/2016 0927   BILITOT 0.5 09/20/2016 0927   BILITOT 0.4 09/05/2016 1202   BILIDIR 0.1 09/20/2016 0927   IBILI 0.5 10/27/2013 0910      Component Value Date/Time   TSH 0.624 09/05/2016 1202   TSH 0.59 12/21/2014 0956   TSH 0.68 05/13/2014 0927    ASSESSMENT AND PLAN: Type 2 diabetes mellitus without complication, without long-term current use of insulin ([place]) - Plan: liraglutide 18 MG/3ML SOPN  Essential hypertension  Class 1 obesity  without serious comorbidity with body mass index (BMI) of 34.0 to 34.9 in adult, unspecified obesity type  PLAN:  Diabetes II [name] [name] has been given extensive diabetes education by myself today including ideal fasting and post-prandial blood glucose readings, individual ideal Hgb A1c goals and hypoglycemia prevention. We discussed the importance of good blood sugar control to decrease the likelihood of diabetic complications such as nephropathy, neuropathy, limb loss, blindness, coronary artery disease, and death. We discussed the importance of intensive lifestyle modification including diet, exercise and weight loss as the first line treatment for diabetes. [name] [name] agrees to continue her diabetes medications, we will refill Victoza 18 mg/3 ml inject 0.3 ml into skin daily 3 pen with no refills and will follow up at the agreed upon time.  Hypertension We discussed sodium restriction, working on healthy weight loss, and a regular exercise program as the means to achieve improved blood pressure control. [name] [name] agreed with this plan and agreed to follow up as directed. We will continue to monitor her blood pressure as well as her progress with the above lifestyle modifications. She will continue Microzide and Lopressor as prescribed and will watch for signs of hypotension as she continues her lifestyle modifications.  Obesity [name] [name] is currently in the action stage of change. As such, her goal is to continue with weight loss efforts She has agreed to follow a lower carbohydrate, vegetable and lean protein rich diet plan [name] [name] has been instructed to work up to a goal of 150 minutes of combined cardio and strengthening exercise per week for weight loss and overall health benefits. We discussed the following Behavioral Modification Strategies today: increasing lean protein intake, decreasing simple carbohydrates  and work on meal planning and easy cooking plans  [name] [name] has agreed to follow up with our clinic in  2 weeks. She was informed of the importance of frequent follow up visits to maximize her success with intensive lifestyle modifications for her multiple health conditions.  I, Doreene Nest, am acting as scribe for Dennard Nip, MD  I have reviewed the above documentation for accuracy and completeness, and I agree with the above. -Dennard Nip, MD  OBESITY BEHAVIORAL INTERVENTION VISIT  Today's visit was # 5 out of 22.  Starting weight: 197 lbs Starting date: 09/05/16 Today's weight : 195 lbs Today's date: 11/07/2016 Total lbs lost to date: 2 (Patients must lose 7 lbs in the first 6 months to continue with counseling)   ASK: We discussed the diagnosis of obesity with Iven [name] today and [place] agreed to give [place] permission to discuss obesity behavioral modification therapy today.  ASSESS: [name] [name] has the diagnosis of obesity and her BMI today is 34.6 [name] [name] is in the action stage of change   ADVISE: [name] [name] was educated on the multiple health risks of obesity as well as the benefit of weight loss to improve her health. She was advised of the need for long term treatment and the importance of lifestyle modifications.  AGREE: Multiple dietary modification options and treatment options were discussed and  [name] [name] agreed to follow a lower carbohydrate, vegetable and lean protein rich diet plan We discussed the following Behavioral Modification Strategies today: increasing lean protein intake, decreasing simple carbohydrates  and work on meal planning and easy cooking plans

## 2016-11-21 ENCOUNTER — Ambulatory Visit (INDEPENDENT_AMBULATORY_CARE_PROVIDER_SITE_OTHER): Payer: 59 | Admitting: Family

## 2016-11-21 ENCOUNTER — Ambulatory Visit (INDEPENDENT_AMBULATORY_CARE_PROVIDER_SITE_OTHER): Payer: 59 | Admitting: Family Medicine

## 2016-11-21 ENCOUNTER — Encounter: Payer: Self-pay | Admitting: Family

## 2016-11-21 VITALS — BP 131/51 | HR 62 | Temp 98.2°F | Resp 16 | Ht 63.0 in | Wt 198.4 lb

## 2016-11-21 DIAGNOSIS — IMO0001 Reserved for inherently not codable concepts without codable children: Secondary | ICD-10-CM

## 2016-11-21 DIAGNOSIS — I10 Essential (primary) hypertension: Secondary | ICD-10-CM | POA: Diagnosis not present

## 2016-11-21 DIAGNOSIS — T148XXA Other injury of unspecified body region, initial encounter: Secondary | ICD-10-CM | POA: Diagnosis not present

## 2016-11-21 DIAGNOSIS — E1165 Type 2 diabetes mellitus with hyperglycemia: Secondary | ICD-10-CM

## 2016-11-21 DIAGNOSIS — B351 Tinea unguium: Secondary | ICD-10-CM

## 2016-11-21 MED ORDER — METFORMIN HCL 500 MG PO TABS: 500.0000 mg | ORAL_TABLET | Freq: Two times a day (BID) | ORAL | 3 refills | Status: DC

## 2016-11-21 NOTE — Progress Notes (Signed)
Subjective:    Patient ID: [name] [name], female    DOB: [birth year], 66 y.o.   MRN: [id number]  HPI  [name] [name] is a 66 yr old female who presents today for follow up.    DM2- reports that her blood sugars have been elevated. Maintained on victoza per weight loss clinic (dose was recently increased). Sugars at home have been in the high 180's to mid 200's. Wt Readings from Last 3 Encounters:  11/21/16 198 lb 6.4 oz (90 kg)  11/07/16 195 lb (88.5 kg)  10/24/16 194 lb (88 kg)    Lab Results  Component Value Date   HGBA1C 7.1 (H) 09/05/2016   HGBA1C 7.0 (H) 08/22/2016   HGBA1C 6.7 (H) 05/23/2016   Lab Results  Component Value Date   MICROALBUR 1.9 05/23/2016   LDLCALC 51 09/05/2016   CREATININE 0.77 09/05/2016    HTN- maintained on benicar HCT + 12.5mg  HCTZ.   BP Readings from Last 3 Encounters:  11/21/16 (!) 131/51  11/07/16 131/81  10/24/16 (!) 161/69   Fall- reports that she fell 1 week ago. Reports that she was in the [employer] store. Sat in a wheeled chair and the chair rolled out from under her. She struck the back of her right arm and she now has bruising.  Onychomycosis- notes improvement in her "athletes foot."  compelted 12 weeks of lamisil.    Review of Systems    see HPI  Past Medical History:  Diagnosis Date  . Allergic rhinitis, cause unspecified   . ASCUS on Pap smear   . Back pain   . CAD (coronary artery disease)   . Constipation   . Depression   . Depressive disorder, not elsewhere classified   . Esophageal reflux   . Fatty liver 04/26/2015  . GERD (gastroesophageal reflux disease)   . Hyperlipidemia   . Hypertension   . Obstructive sleep apnea (adult) (pediatric)   . Personal history of goiter    childhood  . Type II or unspecified type diabetes mellitus without mention of complication, not stated as uncontrolled      Social History   Social History  . Marital status: Married    Spouse name: N/A  . Number of children: 3  . Years of  education: N/A   Occupational History  . 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  [employer]   Social History Main Topics  . Smoking status: Former Smoker    Years: 46.00    Types: E-cigarettes    Quit date: 09/23/2012  . Smokeless tobacco: Never Used     Comment: Uses vapor cigarette  . Alcohol use 0.0 oz/week     Comment: occasional  . Drug use: No  . Sexual activity: Not on file   Other Topics Concern  . Not on file   Social History Narrative   Caffeine Use:  2 cups coffee daily   Regular exercise:  No          Past Surgical History:  Procedure Laterality Date  . BUNIONECTOMY    . colposcopy  2012   cervical biopsy (Dr. Benjie Karvonen)  . CORONARY STENT PLACEMENT  2004  . goiter removal  1968  . TUBAL LIGATION  1985    Family History  Problem Relation Age of Onset  . Cancer Mother        throat, cervical  . Diabetes Mother   . Stroke Mother   . Heart attack  Mother   . Heart disease Mother   . Hypertension Mother   . Thyroid disease Mother   . Alcoholism Mother   . Cancer Father        prostate  . Diabetes Sister   . Heart attack Sister   . Hypertension Sister   . Hyperlipidemia Sister   . Colon cancer Neg Hx     No Known Allergies  Current Outpatient Prescriptions on File Prior to Visit  Medication Sig Dispense Refill  . ARIPiprazole (ABILIFY) 5 MG tablet Take 5 mg by mouth daily.      Marland Kitchen aspirin 81 MG tablet Take 81 mg by mouth daily.      . BD PEN NEEDLE NANO U/F 32G X 4 MM MISC USE ONCE DAILY WITH INSULIN INJECTION 100 each 1  . buPROPion (WELLBUTRIN XL) 150 MG 24 hr tablet Take 450 mg by mouth daily.      . Calcium Carbonate-Vitamin D (CALTRATE 600+D) 600-400 MG-UNIT per tablet Take 1 tablet by mouth 2 (two) times daily.    Marland Kitchen glucose blood (ONE TOUCH ULTRA TEST) test strip 1 each by Other route 3 (three) times daily. for testing 100 each 0  . hydrochlorothiazide (MICROZIDE) 12.5 MG capsule take 1 capsule by mouth once daily 30  capsule 2  . Lancets (ONETOUCH ULTRASOFT) lancets TEST BLOOD SUGAR ONCE DAILY 100 each 0  . liraglutide 18 MG/3ML SOPN Inject 0.3 mLs (1.8 mg total) into the skin daily. 3 pen 0  . metoprolol (LOPRESSOR) 50 MG tablet take 1 tablet by mouth twice a day 60 tablet 2  . Multiple Vitamins-Minerals (MULTIVITAMIN ADULTS 50+ PO) Take 1 tablet by mouth daily.    Marland Kitchen olmesartan-hydrochlorothiazide (BENICAR HCT) 20-12.5 MG tablet Take 0.5 tablets by mouth daily. 45 tablet 1  . pantoprazole (PROTONIX) 40 MG tablet Take 1 tablet (40 mg total) by mouth daily. 30 tablet 5  . Probiotic Product (PROBIOTIC DAILY PO) Take 1 capsule by mouth daily.    . rosuvastatin (CRESTOR) 20 MG tablet take 1 tablet by mouth once daily 30 tablet 5  . terbinafine (LAMISIL) 250 MG tablet Take 1 tablet (250 mg total) by mouth daily. 30 tablet 1  . Vitamin D, Ergocalciferol, (DRISDOL) 50000 units CAPS capsule Take 1 capsule (50,000 Units total) by mouth every 7 (seven) days. 4 capsule 0   No current facility-administered medications on file prior to visit.     BP (!) 131/51 (BP Location: Left Arm, Cuff Size: Large)   Pulse 62   Temp 98.2 F (36.8 C) (Oral)   Resp 16   Ht 5\' 3"  (1.6 m)   Wt 198 lb 6.4 oz (90 kg)   SpO2 100%   BMI 35.14 kg/m    Objective:   Physical Exam  Constitutional: She appears well-developed and well-nourished.  Cardiovascular: Normal rate, regular rhythm and normal heart sounds.   No murmur heard. Pulmonary/Chest: Effort normal and breath sounds normal. No respiratory distress. She has no wheezes.  Skin:     + hematoma with surrounding ecchymosis right upper arm  Fresh healthy nail growth noted at base of right great toenail.  Left great toenail remains thickened. Skin on both feet appears healthy.   Psychiatric: She has a normal mood and affect. Her behavior is normal. Judgment and thought content normal.          Assessment & Plan:  Onychomycosis- improved.  I am hopeful that with  some additional time we will see some healthy growth at  the base of the left great toenail  DM2- uncontrolled. Add metformin. She will return for A1C in 2 weeks (too soon to repeat today).   HTN-BP stable, continue current meds.  Hematoma- reassurance provided. Should improve with time.

## 2016-11-21 NOTE — Patient Instructions (Addendum)
Please schedule lab visit after 12/06/16.

## 2016-12-06 ENCOUNTER — Ambulatory Visit (INDEPENDENT_AMBULATORY_CARE_PROVIDER_SITE_OTHER): Payer: 59 | Admitting: Family Medicine

## 2016-12-06 VITALS — BP 159/81 | HR 62 | Temp 97.6°F | Ht 63.0 in | Wt 194.0 lb

## 2016-12-06 DIAGNOSIS — Z6832 Body mass index (BMI) 32.0-32.9, adult: Secondary | ICD-10-CM | POA: Diagnosis not present

## 2016-12-06 DIAGNOSIS — E669 Obesity, unspecified: Secondary | ICD-10-CM | POA: Diagnosis not present

## 2016-12-06 DIAGNOSIS — E559 Vitamin D deficiency, unspecified: Secondary | ICD-10-CM | POA: Diagnosis not present

## 2016-12-06 DIAGNOSIS — E119 Type 2 diabetes mellitus without complications: Secondary | ICD-10-CM | POA: Diagnosis not present

## 2016-12-06 MED ORDER — VITAMIN D (ERGOCALCIFEROL) 1.25 MG (50000 UNIT) PO CAPS: 50000.0000 [IU] | ORAL_CAPSULE | ORAL | 0 refills | Status: DC

## 2016-12-06 NOTE — Progress Notes (Signed)
Office: [phone]  /  Fax: [phone]   HPI:   Chief Complaint: OBESITY [name] [name] is here to discuss her progress with her obesity treatment plan. She is on the  follow a lower carbohydrate, vegetable and lean protein rich diet plan and is following her eating plan approximately 0 % of the time. She states she is exercising 0 minutes 0 times per week. [name] [name] continues to do well with weight loss. She has had a hard time following the plan because planning ahead has been challenging. Her weight is 194 lb (88 kg) today and has had a weight loss of 1 pound over a period of 4 weeks since her last visit. She has lost 3 lbs since starting treatment with [place].  Vitamin D deficiency [name] [name] has a diagnosis of vitamin D deficiency. She is currently taking vit D and denies nausea, vomiting or muscle weakness.  Diabetes II [name] [name] has a diagnosis of diabetes type II. [name] [name] states fasting BGs range between 112 and 200 and denies any hypoglycemic episodes. She started metformin one week ago but has not been taking regularly. She has been working on intensive lifestyle modifications including diet, exercise, and weight loss to help control her blood glucose levels.  ALLERGIES: No Known Allergies  MEDICATIONS: Current Outpatient Prescriptions on File Prior to Visit  Medication Sig Dispense Refill  . ARIPiprazole (ABILIFY) 5 MG tablet Take 5 mg by mouth daily.      Marland Kitchen aspirin 81 MG tablet Take 81 mg by mouth daily.      . BD PEN NEEDLE NANO U/F 32G X 4 MM MISC USE ONCE DAILY WITH INSULIN INJECTION 100 each 1  . buPROPion (WELLBUTRIN XL) 150 MG 24 hr tablet Take 450 mg by mouth daily.      . Calcium Carbonate-Vitamin D (CALTRATE 600+D) 600-400 MG-UNIT per tablet Take 1 tablet by mouth 2 (two) times daily.    Marland Kitchen glucose blood (ONE TOUCH ULTRA TEST) test strip 1 each by Other route 3 (three) times daily. for testing 100 each 0  . hydrochlorothiazide (MICROZIDE) 12.5 MG capsule take 1 capsule by mouth once daily  30 capsule 2  . Lancets (ONETOUCH ULTRASOFT) lancets TEST BLOOD SUGAR ONCE DAILY 100 each 0  . liraglutide 18 MG/3ML SOPN Inject 0.3 mLs (1.8 mg total) into the skin daily. 3 pen 0  . metFORMIN (GLUCOPHAGE) 500 MG tablet Take 1 tablet (500 mg total) by mouth 2 (two) times daily with a meal. 180 tablet 3  . metoprolol (LOPRESSOR) 50 MG tablet take 1 tablet by mouth twice a day 60 tablet 2  . Multiple Vitamins-Minerals (MULTIVITAMIN ADULTS 50+ PO) Take 1 tablet by mouth daily.    Marland Kitchen olmesartan-hydrochlorothiazide (BENICAR HCT) 20-12.5 MG tablet Take 0.5 tablets by mouth daily. 45 tablet 1  . pantoprazole (PROTONIX) 40 MG tablet Take 1 tablet (40 mg total) by mouth daily. 30 tablet 5  . Probiotic Product (PROBIOTIC DAILY PO) Take 1 capsule by mouth daily.    . rosuvastatin (CRESTOR) 20 MG tablet take 1 tablet by mouth once daily 30 tablet 5  . terbinafine (LAMISIL) 250 MG tablet Take 1 tablet (250 mg total) by mouth daily. 30 tablet 1   No current facility-administered medications on file prior to visit.     PAST MEDICAL HISTORY: Past Medical History:  Diagnosis Date  . Allergic rhinitis, cause unspecified   . ASCUS on Pap smear   . Back pain   . CAD (coronary artery disease)   . Constipation   .  Depression   . Depressive disorder, not elsewhere classified   . Esophageal reflux   . Fatty liver 04/26/2015  . GERD (gastroesophageal reflux disease)   . Hyperlipidemia   . Hypertension   . Obstructive sleep apnea (adult) (pediatric)   . Personal history of goiter    childhood  . Type II or unspecified type diabetes mellitus without mention of complication, not stated as uncontrolled     PAST SURGICAL HISTORY: Past Surgical History:  Procedure Laterality Date  . BUNIONECTOMY    . colposcopy  2012   cervical biopsy (Dr. Benjie Karvonen)  . CORONARY STENT PLACEMENT  2004  . goiter removal  1968  . TUBAL LIGATION  1985    SOCIAL HISTORY: Social History  Substance Use Topics  . Smoking  status: Former Smoker    Years: 46.00    Types: E-cigarettes    Quit date: 09/23/2012  . Smokeless tobacco: Never Used     Comment: Uses vapor cigarette  . Alcohol use 0.0 oz/week     Comment: occasional    FAMILY HISTORY: Family History  Problem Relation Age of Onset  . Cancer Mother        throat, cervical  . Diabetes Mother   . Stroke Mother   . Heart attack Mother   . Heart disease Mother   . Hypertension Mother   . Thyroid disease Mother   . Alcoholism Mother   . Cancer Father        prostate  . Diabetes Sister   . Heart attack Sister   . Hypertension Sister   . Hyperlipidemia Sister   . Colon cancer Neg Hx     ROS: Review of Systems  Constitutional: Positive for weight loss.  Gastrointestinal: Negative for nausea and vomiting.  Musculoskeletal:       Negative muscle weakness  Endo/Heme/Allergies:       Negative hypoglycemia    PHYSICAL EXAM: Blood pressure (!) 159/81, pulse 62, temperature 97.6 F (36.4 C), temperature source Oral, height 5\' 3"  (1.6 m), weight 194 lb (88 kg), SpO2 95 %. Body mass index is 34.37 kg/m. Physical Exam  Constitutional: She is oriented to person, place, and time. She appears well-developed and well-nourished.  Cardiovascular: Normal rate.   Pulmonary/Chest: Effort normal.  Musculoskeletal: Normal range of motion.  Neurological: She is oriented to person, place, and time.  Skin: Skin is warm and dry.  Psychiatric: She has a normal mood and affect. Her behavior is normal.  Vitals reviewed.   RECENT LABS AND TESTS: BMET    Component Value Date/Time   NA 142 09/05/2016 1202   K 4.1 09/05/2016 1202   CL 101 09/05/2016 1202   CO2 26 09/05/2016 1202   GLUCOSE 129 (H) 09/05/2016 1202   GLUCOSE 159 (H) 08/22/2016 0906   BUN 14 09/05/2016 1202   CREATININE 0.77 09/05/2016 1202   CREATININE 0.70 12/15/2014 1949   CALCIUM 9.9 09/05/2016 1202   GFRNONAA 81 09/05/2016 1202   GFRNONAA 80 10/27/2013 0910   GFRAA 93 09/05/2016  1202   GFRAA >89 10/27/2013 0910   Lab Results  Component Value Date   HGBA1C 7.1 (H) 09/05/2016   HGBA1C 7.0 (H) 08/22/2016   HGBA1C 6.7 (H) 05/23/2016   HGBA1C 6.2 02/15/2016   HGBA1C 6.1 11/16/2015   Lab Results  Component Value Date   INSULIN 66.8 (H) 09/05/2016   CBC    Component Value Date/Time   WBC 5.1 09/05/2016 1202   WBC 4.6 12/21/2014 0956  RBC 4.75 09/05/2016 1202   RBC 4.54 12/21/2014 0956   HGB 13.3 09/05/2016 1202   HCT 42.1 09/05/2016 1202   PLT 159.0 12/21/2014 0956   MCV 89 09/05/2016 1202   MCH 28.0 09/05/2016 1202   MCH 29.2 05/09/2011 0931   MCHC 31.6 09/05/2016 1202   MCHC 34.0 12/21/2014 0956   RDW 15.7 (H) 09/05/2016 1202   LYMPHSABS 1.7 09/05/2016 1202   MONOABS 0.2 12/21/2014 0956   EOSABS 0.1 09/05/2016 1202   BASOSABS 0.0 09/05/2016 1202   Iron/TIBC/Ferritin/ %Sat    Component Value Date/Time   FERRITIN 37.3 12/29/2014 0856   Lipid Panel     Component Value Date/Time   CHOL 132 09/05/2016 1202   TRIG 139 09/05/2016 1202   HDL 53 09/05/2016 1202   CHOLHDL 2 05/23/2016 0957   VLDL 24.8 05/23/2016 0957   LDLCALC 51 09/05/2016 1202   LDLDIRECT 77.7 12/27/2009 0942   Hepatic Function Panel     Component Value Date/Time   PROT 7.1 09/20/2016 0927   PROT 6.9 09/05/2016 1202   ALBUMIN 4.3 09/20/2016 0927   ALBUMIN 4.3 09/05/2016 1202   AST 25 09/20/2016 0927   ALT 45 (H) 09/20/2016 0927   ALKPHOS 57 09/20/2016 0927   BILITOT 0.5 09/20/2016 0927   BILITOT 0.4 09/05/2016 1202   BILIDIR 0.1 09/20/2016 0927   IBILI 0.5 10/27/2013 0910      Component Value Date/Time   TSH 0.624 09/05/2016 1202   TSH 0.59 12/21/2014 0956   TSH 0.68 05/13/2014 0927    ASSESSMENT AND PLAN: Vitamin D deficiency - Plan: Vitamin D, Ergocalciferol, (DRISDOL) 50000 units CAPS capsule  Type 2 diabetes mellitus without complication, without long-term current use of insulin (HCC)  Class 1 obesity with serious comorbidity and body mass index (BMI) of  32.0 to 32.9 in adult, unspecified obesity type  PLAN:  Vitamin D Deficiency [name] [name] was informed that low vitamin D levels contributes to fatigue and are associated with obesity, breast, and colon cancer. She agrees to continue to take prescription Vit D @50 ,000 IU every week, we will refill for 1 month and will follow up for routine testing of vitamin D, at least 2-3 times per year. She was informed of the risk of over-replacement of vitamin D and agrees to not increase her dose unless he discusses this with [place] first. [name] [name] agrees to follow up with our clinic in 2 to 3 weeks.  Diabetes II [name] [name] has been given extensive diabetes education by myself today including ideal fasting and post-prandial blood glucose readings, individual ideal Hgb A1c goals  and hypoglycemia prevention. We discussed the importance of good blood sugar control to decrease the likelihood of diabetic complications such as nephropathy, neuropathy, limb loss, blindness, coronary artery disease, and death. We discussed the importance of intensive lifestyle modification including diet, exercise and weight loss as the first line treatment for diabetes. [name] [name] agrees to continue her diabetes medications and will follow up at the agreed upon time.  Obesity [name] [name] is currently in the action stage of change. As such, her goal is to continue with weight loss efforts She has agreed to follow the Category 2 plan [name] [name] has been instructed to work up to a goal of 150 minutes of combined cardio and strengthening exercise per week for weight loss and overall health benefits. We discussed the following Behavioral Modification Strategies today: increasing lean protein intake and meal planning & cooking strategies  [name] [name] has agreed to follow up with our clinic in  2 to 3 weeks. She was informed of the importance of frequent follow up visits to maximize her success with intensive lifestyle modifications for her multiple health conditions.  I, Doreene Nest, am acting as transcriptionist for Dennard Nip, MD  I have reviewed the above documentation for accuracy and completeness, and I agree with the above. -Dennard Nip, MD  OBESITY BEHAVIORAL INTERVENTION VISIT  Today's visit was # 6 out of 22.  Starting weight: 197 lbs Starting date: 09/05/16 Today's weight : 194 lbs Today's date: 12/06/2016 Total lbs lost to date: 3 (Patients must lose 7 lbs in the first 6 months to continue with counseling)   ASK: We discussed the diagnosis of obesity with [name] [name] today and [name] [name] agreed to give [place] permission to discuss obesity behavioral modification therapy today.  ASSESS: [name] [name] has the diagnosis of obesity and her BMI today is 34.4 [name] [name] is in the action stage of change   ADVISE: [name] [name] was educated on the multiple health risks of obesity as well as the benefit of weight loss to improve her health. She was advised of the need for long term treatment and the importance of lifestyle modifications.  AGREE: Multiple dietary modification options and treatment options were discussed and  [name] [name] agreed to follow the Category 2 plan We discussed the following Behavioral Modification Stratagies today: increasing lean protein intake and meal planning & cooking strategies

## 2016-12-07 ENCOUNTER — Other Ambulatory Visit (INDEPENDENT_AMBULATORY_CARE_PROVIDER_SITE_OTHER): Payer: Self-pay | Admitting: Family Medicine

## 2016-12-07 ENCOUNTER — Encounter (INDEPENDENT_AMBULATORY_CARE_PROVIDER_SITE_OTHER): Payer: Self-pay | Admitting: Family Medicine

## 2016-12-07 DIAGNOSIS — E119 Type 2 diabetes mellitus without complications: Secondary | ICD-10-CM

## 2016-12-10 ENCOUNTER — Other Ambulatory Visit (INDEPENDENT_AMBULATORY_CARE_PROVIDER_SITE_OTHER): Payer: Self-pay

## 2016-12-10 DIAGNOSIS — E119 Type 2 diabetes mellitus without complications: Secondary | ICD-10-CM

## 2016-12-10 MED ORDER — LIRAGLUTIDE 18 MG/3ML ~~LOC~~ SOPN: 1.8000 mg | PEN_INJECTOR | Freq: Every day | SUBCUTANEOUS | 0 refills | Status: DC

## 2016-12-10 MED ORDER — GLUCOSE BLOOD VI STRP: 1.0000 | ORAL_STRIP | Freq: Three times a day (TID) | 0 refills | Status: DC

## 2016-12-20 ENCOUNTER — Ambulatory Visit (INDEPENDENT_AMBULATORY_CARE_PROVIDER_SITE_OTHER): Payer: 59 | Admitting: Family Medicine

## 2016-12-20 ENCOUNTER — Other Ambulatory Visit: Payer: Self-pay | Admitting: Family

## 2016-12-20 VITALS — BP 134/80 | HR 66 | Temp 98.1°F | Ht 63.0 in | Wt 194.0 lb

## 2016-12-20 DIAGNOSIS — E559 Vitamin D deficiency, unspecified: Secondary | ICD-10-CM | POA: Diagnosis not present

## 2016-12-20 DIAGNOSIS — Z794 Long term (current) use of insulin: Secondary | ICD-10-CM

## 2016-12-20 DIAGNOSIS — K760 Fatty (change of) liver, not elsewhere classified: Secondary | ICD-10-CM

## 2016-12-20 DIAGNOSIS — E669 Obesity, unspecified: Secondary | ICD-10-CM

## 2016-12-20 DIAGNOSIS — Z6834 Body mass index (BMI) 34.0-34.9, adult: Secondary | ICD-10-CM

## 2016-12-20 DIAGNOSIS — E119 Type 2 diabetes mellitus without complications: Secondary | ICD-10-CM

## 2016-12-20 NOTE — Progress Notes (Signed)
Office: [phone]  /  Fax: [phone]   HPI:   Chief Complaint: OBESITY [name] [name] is here to discuss her progress with her obesity treatment plan. She is on the  follow the Category 2 plan and is following her eating plan approximately 0 % of the time. She states she is walking 30 minutes 2 times per week. [name] [name] did well with maintaining weight while on vacation. She made smarter choices and was able to portion control well. She is ready to get back on track. Her weight is 194 lb (88 kg) today and has maintained weight over a period of 2 weeks since her last visit. She has lost 3 lbs since starting treatment with [place].  Vitamin D deficiency [name] [name] has a diagnosis of vitamin D deficiency. She is currently taking vit D and denies nausea, vomiting or muscle weakness.  Diabetes II [name] [name] has a diagnosis of diabetes type II. She is stable on victoza but not yet at goal. [name] [name] has no BG log today and she denies any hypoglycemic episodes. Last A1c was at 7.1 She has been working on intensive lifestyle modifications including diet, exercise, and weight loss to help control her blood glucose levels.  Non Alcoholic Fatty Liver Disease [name] [name] is attempting to improve non alcoholic fatty liver disease with diet. She is due for labs and [name] [name] denies abdominal pain.   ALLERGIES: No Known Allergies  MEDICATIONS: Current Outpatient Prescriptions on File Prior to Visit  Medication Sig Dispense Refill  . ARIPiprazole (ABILIFY) 5 MG tablet Take 5 mg by mouth daily.      Marland Kitchen aspirin 81 MG tablet Take 81 mg by mouth daily.      . BD PEN NEEDLE NANO U/F 32G X 4 MM MISC USE ONCE DAILY WITH INSULIN INJECTION 100 each 1  . buPROPion (WELLBUTRIN XL) 150 MG 24 hr tablet Take 450 mg by mouth daily.      . Calcium Carbonate-Vitamin D (CALTRATE 600+D) 600-400 MG-UNIT per tablet Take 1 tablet by mouth 2 (two) times daily.    Marland Kitchen glucose blood (ONE TOUCH ULTRA TEST) test strip 1 each by Other route 3 (three) times daily.  for testing 100 each 0  . hydrochlorothiazide (MICROZIDE) 12.5 MG capsule take 1 capsule by mouth once daily 30 capsule 2  . Lancets (ONETOUCH ULTRASOFT) lancets TEST BLOOD SUGAR ONCE DAILY 100 each 0  . liraglutide 18 MG/3ML SOPN Inject 0.3 mLs (1.8 mg total) into the skin daily. 3 pen 0  . metFORMIN (GLUCOPHAGE) 500 MG tablet Take 1 tablet (500 mg total) by mouth 2 (two) times daily with a meal. 180 tablet 3  . metoprolol (LOPRESSOR) 50 MG tablet take 1 tablet by mouth twice a day 60 tablet 2  . Multiple Vitamins-Minerals (MULTIVITAMIN ADULTS 50+ PO) Take 1 tablet by mouth daily.    Marland Kitchen olmesartan-hydrochlorothiazide (BENICAR HCT) 20-12.5 MG tablet Take 0.5 tablets by mouth daily. 45 tablet 1  . pantoprazole (PROTONIX) 40 MG tablet Take 1 tablet (40 mg total) by mouth daily. 30 tablet 5  . Probiotic Product (PROBIOTIC DAILY PO) Take 1 capsule by mouth daily.    . rosuvastatin (CRESTOR) 20 MG tablet take 1 tablet by mouth once daily 30 tablet 5  . terbinafine (LAMISIL) 250 MG tablet Take 1 tablet (250 mg total) by mouth daily. 30 tablet 1  . Vitamin D, Ergocalciferol, (DRISDOL) 50000 units CAPS capsule Take 1 capsule (50,000 Units total) by mouth every 7 (seven) days. 4 capsule 0   No current  facility-administered medications on file prior to visit.     PAST MEDICAL HISTORY: Past Medical History:  Diagnosis Date  . Allergic rhinitis, cause unspecified   . ASCUS on Pap smear   . Back pain   . CAD (coronary artery disease)   . Constipation   . Depression   . Depressive disorder, not elsewhere classified   . Esophageal reflux   . Fatty liver 04/26/2015  . GERD (gastroesophageal reflux disease)   . Hyperlipidemia   . Hypertension   . Obstructive sleep apnea (adult) (pediatric)   . Personal history of goiter    childhood  . Type II or unspecified type diabetes mellitus without mention of complication, not stated as uncontrolled     PAST SURGICAL HISTORY: Past Surgical History:    Procedure Laterality Date  . BUNIONECTOMY    . colposcopy  2012   cervical biopsy (Dr. Benjie Karvonen)  . CORONARY STENT PLACEMENT  2004  . goiter removal  1968  . TUBAL LIGATION  1985    SOCIAL HISTORY: Social History  Substance Use Topics  . Smoking status: Former Smoker    Years: 46.00    Types: E-cigarettes    Quit date: 09/23/2012  . Smokeless tobacco: Never Used     Comment: Uses vapor cigarette  . Alcohol use 0.0 oz/week     Comment: occasional    FAMILY HISTORY: Family History  Problem Relation Age of Onset  . Cancer Mother        throat, cervical  . Diabetes Mother   . Stroke Mother   . Heart attack Mother   . Heart disease Mother   . Hypertension Mother   . Thyroid disease Mother   . Alcoholism Mother   . Cancer Father        prostate  . Diabetes Sister   . Heart attack Sister   . Hypertension Sister   . Hyperlipidemia Sister   . Colon cancer Neg Hx     ROS: Review of Systems  Constitutional: Negative for weight loss.  Gastrointestinal: Negative for abdominal pain, nausea and vomiting.  Musculoskeletal:       Negative muscle weakness  Endo/Heme/Allergies:       Negative hypoglycemia    PHYSICAL EXAM: Blood pressure 134/80, pulse 66, temperature 98.1 F (36.7 C), temperature source Oral, height 5\' 3"  (1.6 m), weight 194 lb (88 kg), SpO2 95 %. Body mass index is 34.37 kg/m. Physical Exam  Constitutional: She is oriented to person, place, and time. She appears well-developed and well-nourished.  Cardiovascular: Normal rate.   Pulmonary/Chest: Effort normal.  Musculoskeletal: Normal range of motion.  Neurological: She is oriented to person, place, and time.  Skin: Skin is warm and dry.  Psychiatric: She has a normal mood and affect. Her behavior is normal.  Vitals reviewed.   RECENT LABS AND TESTS: BMET    Component Value Date/Time   NA 142 09/05/2016 1202   K 4.1 09/05/2016 1202   CL 101 09/05/2016 1202   CO2 26 09/05/2016 1202   GLUCOSE 129  (H) 09/05/2016 1202   GLUCOSE 159 (H) 08/22/2016 0906   BUN 14 09/05/2016 1202   CREATININE 0.77 09/05/2016 1202   CREATININE 0.70 12/15/2014 1949   CALCIUM 9.9 09/05/2016 1202   GFRNONAA 81 09/05/2016 1202   GFRNONAA 80 10/27/2013 0910   GFRAA 93 09/05/2016 1202   GFRAA >89 10/27/2013 0910   Lab Results  Component Value Date   HGBA1C 7.1 (H) 09/05/2016   HGBA1C 7.0 (H)  08/22/2016   HGBA1C 6.7 (H) 05/23/2016   HGBA1C 6.2 02/15/2016   HGBA1C 6.1 11/16/2015   Lab Results  Component Value Date   INSULIN 66.8 (H) 09/05/2016   CBC    Component Value Date/Time   WBC 5.1 09/05/2016 1202   WBC 4.6 12/21/2014 0956   RBC 4.75 09/05/2016 1202   RBC 4.54 12/21/2014 0956   HGB 13.3 09/05/2016 1202   HCT 42.1 09/05/2016 1202   PLT 159.0 12/21/2014 0956   MCV 89 09/05/2016 1202   MCH 28.0 09/05/2016 1202   MCH 29.2 05/09/2011 0931   MCHC 31.6 09/05/2016 1202   MCHC 34.0 12/21/2014 0956   RDW 15.7 (H) 09/05/2016 1202   LYMPHSABS 1.7 09/05/2016 1202   MONOABS 0.2 12/21/2014 0956   EOSABS 0.1 09/05/2016 1202   BASOSABS 0.0 09/05/2016 1202   Iron/TIBC/Ferritin/ %Sat    Component Value Date/Time   FERRITIN 37.3 12/29/2014 0856   Lipid Panel     Component Value Date/Time   CHOL 132 09/05/2016 1202   TRIG 139 09/05/2016 1202   HDL 53 09/05/2016 1202   CHOLHDL 2 05/23/2016 0957   VLDL 24.8 05/23/2016 0957   LDLCALC 51 09/05/2016 1202   LDLDIRECT 77.7 12/27/2009 0942   Hepatic Function Panel     Component Value Date/Time   PROT 7.1 09/20/2016 0927   PROT 6.9 09/05/2016 1202   ALBUMIN 4.3 09/20/2016 0927   ALBUMIN 4.3 09/05/2016 1202   AST 25 09/20/2016 0927   ALT 45 (H) 09/20/2016 0927   ALKPHOS 57 09/20/2016 0927   BILITOT 0.5 09/20/2016 0927   BILITOT 0.4 09/05/2016 1202   BILIDIR 0.1 09/20/2016 0927   IBILI 0.5 10/27/2013 0910      Component Value Date/Time   TSH 0.624 09/05/2016 1202   TSH 0.59 12/21/2014 0956   TSH 0.68 05/13/2014 0927    ASSESSMENT AND  PLAN: Type 2 diabetes mellitus without complication, with long-term current use of insulin ([place]) - Plan: Comprehensive metabolic panel, Hemoglobin A1c, Insulin, random  Non-alcoholic fatty liver disease  Vitamin D deficiency - Plan: VITAMIN D 25 Hydroxy (Vit-D Deficiency, Fractures)  Class 1 obesity without serious comorbidity with body mass index (BMI) of 34.0 to 34.9 in adult, unspecified obesity type  PLAN:  Vitamin D Deficiency [name] [name] was informed that low vitamin D levels contributes to fatigue and are associated with obesity, breast, and colon cancer. She agrees to continue to take prescription Vit D @50 ,000 IU every week, we will refill for 1 month and we will check labs and will follow up for routine testing of vitamin D, at least 2-3 times per year. She was informed of the risk of over-replacement of vitamin D and agrees to not increase her dose unless he discusses this with [place] first. [name] [name] agrees to follow up with our clinic in 2 weeks.  Diabetes II [name] [name] has been given extensive diabetes education by myself today including ideal fasting and post-prandial blood glucose readings, individual ideal Hgb A1c goals  and hypoglycemia prevention. We discussed the importance of good blood sugar control to decrease the likelihood of diabetic complications such as nephropathy, neuropathy, limb loss, blindness, coronary artery disease, and death. We discussed the importance of intensive lifestyle modification including diet, exercise and weight loss as the first line treatment for diabetes. We will check labs and [name] [name] agrees to continue victoza, we will refill for 1 month and she will follow up at the agreed upon time.  Non Alcoholic Fatty Liver Disease We will  check labs and [name] [name] agreed to continue with her weight loss efforts with healthier diet and exercise as an essential part of her treatment plan.  Obesity [name] [name] is currently in the action stage of change. As such, her goal is to continue  with weight loss efforts She has agreed to follow the Category 2 plan [name] [name] has been instructed to work up to a goal of 150 minutes of combined cardio and strengthening exercise per week for weight loss and overall health benefits. We discussed the following Behavioral Modification Strategies today: increasing lean protein intake , meal planning & cooking strategies and planning for success [name] [name] has agreed to follow up with our clinic in 2 weeks. She was informed of the importance of frequent follow up visits to maximize her success with intensive lifestyle modifications for her multiple health conditions.  I, [name] [name], am acting as transcriptionist for Dennard Nip, MD  I have reviewed the above documentation for accuracy and completeness, and I agree with the above. -Dennard Nip, MD   OBESITY BEHAVIORAL INTERVENTION VISIT  Today's visit was # 7 out of 22.  Starting weight: 197 lbs Starting date: 09/05/16 Today's weight : 194 lbs Today's date: 12/20/2016 Total lbs lost to date: 3 (Patients must lose 7 lbs in the first 6 months to continue with counseling)   ASK: We discussed the diagnosis of obesity with [name] [name] today and [name] [name] agreed to give [place] permission to discuss obesity behavioral modification therapy today.  ASSESS: [name] [name] has the diagnosis of obesity and her BMI today is 34.4 [name] [name] is in the action stage of change   ADVISE: [name] [name] was educated on the multiple health risks of obesity as well as the benefit of weight loss to improve her health. She was advised of the need for long term treatment and the importance of lifestyle modifications.  AGREE: Multiple dietary modification options and treatment options were discussed and  [name] [name] agreed to follow the Category 2 plan We discussed the following Behavioral Modification Strategies today: increasing lean protein intake, meal planning & cooking strategies and planning for success

## 2016-12-21 ENCOUNTER — Encounter: Payer: Self-pay | Admitting: Family

## 2016-12-21 LAB — COMPREHENSIVE METABOLIC PANEL
ALT: 44 IU/L — ABNORMAL HIGH (ref 0–32)
AST: 28 IU/L (ref 0–40)
Albumin/Globulin Ratio: 1.8 (ref 1.2–2.2)
Albumin: 4.4 g/dL (ref 3.6–4.8)
Alkaline Phosphatase: 71 IU/L (ref 39–117)
BUN/Creatinine Ratio: 20 (ref 12–28)
BUN: 15 mg/dL (ref 8–27)
Bilirubin Total: 0.4 mg/dL (ref 0.0–1.2)
CO2: 24 mmol/L (ref 20–29)
Calcium: 10 mg/dL (ref 8.7–10.3)
Chloride: 101 mmol/L (ref 96–106)
Creatinine, Ser: 0.74 mg/dL (ref 0.57–1.00)
GFR calc Af Amer: 98 mL/min/{1.73_m2} (ref >59)
GFR calc non Af Amer: 85 mL/min/{1.73_m2} (ref >59)
Globulin, Total: 2.4 g/dL (ref 1.5–4.5)
Glucose: 115 mg/dL — ABNORMAL HIGH (ref 65–99)
Potassium: 4.1 mmol/L (ref 3.5–5.2)
Sodium: 142 mmol/L (ref 134–144)
Total Protein: 6.8 g/dL (ref 6.0–8.5)

## 2016-12-21 LAB — VITAMIN D 25 HYDROXY (VIT D DEFICIENCY, FRACTURES): Vit D, 25-Hydroxy: 49.4 ng/mL (ref 30.0–100.0)

## 2016-12-21 LAB — INSULIN, RANDOM: INSULIN: 62 u[IU]/mL — ABNORMAL HIGH (ref 2.6–24.9)

## 2016-12-21 LAB — HEMOGLOBIN A1C
Est. average glucose Bld gHb Est-mCnc: 134 mg/dL
Hgb A1c MFr Bld: 6.3 % — ABNORMAL HIGH (ref 4.8–5.6)

## 2016-12-21 MED ORDER — HYDROCHLOROTHIAZIDE 12.5 MG PO CAPS: 12.5000 mg | ORAL_CAPSULE | Freq: Every day | ORAL | 3 refills | Status: DC

## 2016-12-21 MED ORDER — METOPROLOL TARTRATE 50 MG PO TABS: 50.0000 mg | ORAL_TABLET | Freq: Two times a day (BID) | ORAL | 3 refills | Status: DC

## 2017-01-03 ENCOUNTER — Ambulatory Visit (INDEPENDENT_AMBULATORY_CARE_PROVIDER_SITE_OTHER): Payer: 59 | Admitting: Family Medicine

## 2017-01-03 VITALS — BP 153/84 | HR 57 | Temp 98.2°F | Ht 63.0 in | Wt 197.0 lb

## 2017-01-03 DIAGNOSIS — E669 Obesity, unspecified: Secondary | ICD-10-CM

## 2017-01-03 DIAGNOSIS — Z6834 Body mass index (BMI) 34.0-34.9, adult: Secondary | ICD-10-CM

## 2017-01-03 DIAGNOSIS — F3289 Other specified depressive episodes: Secondary | ICD-10-CM

## 2017-01-03 DIAGNOSIS — E559 Vitamin D deficiency, unspecified: Secondary | ICD-10-CM

## 2017-01-03 MED ORDER — VITAMIN D (ERGOCALCIFEROL) 1.25 MG (50000 UNIT) PO CAPS: 50000.0000 [IU] | ORAL_CAPSULE | ORAL | 0 refills | Status: DC

## 2017-01-03 NOTE — Progress Notes (Signed)
Office: [phone]  /  Fax: [phone]   HPI:   Chief Complaint: OBESITY [name] [name] is here to discuss her progress with her obesity treatment plan. She is on the  follow the Category 2 plan and is following her eating plan approximately 0 % of the time. She states she is exercising 0 minutes 0 times per week. [name] [name] is completely off her diet plan. She states her motivation to change has decreased and states she know what to do but just can't seem to do it. She is increasing eating out and drinking calories/sugar. She has no support from her husband.  Her weight is 197 lb (89.4 kg) today and has had a weight gain of 3 pounds over a period of 2 weeks since her last visit. She has lost 0 lbs since starting treatment with [place].  Vitamin D deficiency Ameenah has a diagnosis of vitamin D deficiency. She is currently taking vit D and denies nausea, vomiting or muscle weakness.  Depression with emotional eating behaviors [name] [name] is on wellbutrin and abilify and she is back to using food for comfort in the last few weeks. [name] [name] struggles with emotional eating and using food for comfort to the extent that it is negatively impacting her health. She often snacks when she is not hungry. [name] [name] sometimes feels she is out of control and then feels guilty that she made poor food choices. She has been working on behavior modification techniques to help reduce her emotional eating and has been somewhat successful. She shows no sign of suicidal or homicidal ideations.  Depression screen [facility] 2/9 09/05/2016 08/22/2016 04/25/2015 12/15/2014  Decreased Interest 1 0 0 0  Down, Depressed, Hopeless 1 0 0 0  PHQ - 2 Score 2 0 0 0  Altered sleeping 1 0 - -  Tired, decreased energy 3 0 - -  Change in appetite 2 2 - -  Feeling bad or failure about yourself  3 0 - -  Trouble concentrating 3 0 - -  Moving slowly or fidgety/restless 1 0 - -  Suicidal thoughts 0 0 - -  PHQ-9 Score 15 2 - -     ALLERGIES: No Known  Allergies  MEDICATIONS: Current Outpatient Prescriptions on File Prior to Visit  Medication Sig Dispense Refill  . ARIPiprazole (ABILIFY) 5 MG tablet Take 5 mg by mouth daily.      Marland Kitchen aspirin 81 MG tablet Take 81 mg by mouth daily.      . BD PEN NEEDLE NANO U/F 32G X 4 MM MISC USE ONCE DAILY WITH INSULIN INJECTION 100 each 1  . buPROPion (WELLBUTRIN XL) 150 MG 24 hr tablet Take 450 mg by mouth daily.      . Calcium Carbonate-Vitamin D (CALTRATE 600+D) 600-400 MG-UNIT per tablet Take 1 tablet by mouth 2 (two) times daily.    Marland Kitchen glucose blood (ONE TOUCH ULTRA TEST) test strip 1 each by Other route 3 (three) times daily. for testing 100 each 0  . hydrochlorothiazide (MICROZIDE) 12.5 MG capsule Take 1 capsule (12.5 mg total) by mouth daily. 30 capsule 3  . Lancets (ONETOUCH ULTRASOFT) lancets TEST BLOOD SUGAR ONCE DAILY 100 each 0  . liraglutide 18 MG/3ML SOPN Inject 0.3 mLs (1.8 mg total) into the skin daily. 3 pen 0  . metFORMIN (GLUCOPHAGE) 500 MG tablet Take 1 tablet (500 mg total) by mouth 2 (two) times daily with a meal. 180 tablet 3  . metoprolol tartrate (LOPRESSOR) 50 MG tablet Take 1 tablet (50 mg  total) by mouth 2 (two) times daily. 60 tablet 3  . Multiple Vitamins-Minerals (MULTIVITAMIN ADULTS 50+ PO) Take 1 tablet by mouth daily.    Marland Kitchen olmesartan-hydrochlorothiazide (BENICAR HCT) 20-12.5 MG tablet Take 0.5 tablets by mouth daily. 45 tablet 1  . pantoprazole (PROTONIX) 40 MG tablet Take 1 tablet (40 mg total) by mouth daily. 30 tablet 5  . Probiotic Product (PROBIOTIC DAILY PO) Take 1 capsule by mouth daily.    . rosuvastatin (CRESTOR) 20 MG tablet take 1 tablet by mouth once daily 30 tablet 5  . terbinafine (LAMISIL) 250 MG tablet Take 1 tablet (250 mg total) by mouth daily. 30 tablet 1   No current facility-administered medications on file prior to visit.     PAST MEDICAL HISTORY: Past Medical History:  Diagnosis Date  . Allergic rhinitis, cause unspecified   . ASCUS on Pap smear    . Back pain   . CAD (coronary artery disease)   . Constipation   . Depression   . Depressive disorder, not elsewhere classified   . Esophageal reflux   . Fatty liver 04/26/2015  . GERD (gastroesophageal reflux disease)   . Hyperlipidemia   . Hypertension   . Obstructive sleep apnea (adult) (pediatric)   . Personal history of goiter    childhood  . Type II or unspecified type diabetes mellitus without mention of complication, not stated as uncontrolled     PAST SURGICAL HISTORY: Past Surgical History:  Procedure Laterality Date  . BUNIONECTOMY    . colposcopy  2012   cervical biopsy (Dr. Benjie Karvonen)  . CORONARY STENT PLACEMENT  2004  . goiter removal  1968  . TUBAL LIGATION  1985    SOCIAL HISTORY: Social History  Substance Use Topics  . Smoking status: Former Smoker    Years: 46.00    Types: E-cigarettes    Quit date: 09/23/2012  . Smokeless tobacco: Never Used     Comment: Uses vapor cigarette  . Alcohol use 0.0 oz/week     Comment: occasional    FAMILY HISTORY: Family History  Problem Relation Age of Onset  . Cancer Mother        throat, cervical  . Diabetes Mother   . Stroke Mother   . Heart attack Mother   . Heart disease Mother   . Hypertension Mother   . Thyroid disease Mother   . Alcoholism Mother   . Cancer Father        prostate  . Diabetes Sister   . Heart attack Sister   . Hypertension Sister   . Hyperlipidemia Sister   . Colon cancer Neg Hx     ROS: Review of Systems  Constitutional: Negative for weight loss.  Gastrointestinal: Negative for nausea and vomiting.  Musculoskeletal:       Negative muscle weakness  Psychiatric/Behavioral: Positive for depression. Negative for suicidal ideas.    PHYSICAL EXAM: Blood pressure (!) 153/84, pulse (!) 57, temperature 98.2 F (36.8 C), temperature source Oral, height 5\' 3"  (1.6 m), weight 197 lb (89.4 kg), SpO2 97 %. Body mass index is 34.9 kg/m. Physical Exam  Constitutional: She is oriented to  person, place, and time. She appears well-developed and well-nourished.  Cardiovascular: Normal rate.   Pulmonary/Chest: Effort normal.  Musculoskeletal: Normal range of motion.  Neurological: She is oriented to person, place, and time.  Skin: Skin is warm and dry.  Vitals reviewed.   RECENT LABS AND TESTS: BMET    Component Value Date/Time   NA  142 12/20/2016 0941   K 4.1 12/20/2016 0941   CL 101 12/20/2016 0941   CO2 24 12/20/2016 0941   GLUCOSE 115 (H) 12/20/2016 0941   GLUCOSE 159 (H) 08/22/2016 0906   BUN 15 12/20/2016 0941   CREATININE 0.74 12/20/2016 0941   CREATININE 0.70 12/15/2014 1949   CALCIUM 10.0 12/20/2016 0941   GFRNONAA 85 12/20/2016 0941   GFRNONAA 80 10/27/2013 0910   GFRAA 98 12/20/2016 0941   GFRAA >89 10/27/2013 0910   Lab Results  Component Value Date   HGBA1C 6.3 (H) 12/20/2016   HGBA1C 7.1 (H) 09/05/2016   HGBA1C 7.0 (H) 08/22/2016   HGBA1C 6.7 (H) 05/23/2016   HGBA1C 6.2 02/15/2016   Lab Results  Component Value Date   INSULIN 62.0 (H) 12/20/2016   INSULIN 66.8 (H) 09/05/2016   CBC    Component Value Date/Time   WBC 5.1 09/05/2016 1202   WBC 4.6 12/21/2014 0956   RBC 4.75 09/05/2016 1202   RBC 4.54 12/21/2014 0956   HGB 13.3 09/05/2016 1202   HCT 42.1 09/05/2016 1202   PLT 159.0 12/21/2014 0956   MCV 89 09/05/2016 1202   MCH 28.0 09/05/2016 1202   MCH 29.2 05/09/2011 0931   MCHC 31.6 09/05/2016 1202   MCHC 34.0 12/21/2014 0956   RDW 15.7 (H) 09/05/2016 1202   LYMPHSABS 1.7 09/05/2016 1202   MONOABS 0.2 12/21/2014 0956   EOSABS 0.1 09/05/2016 1202   BASOSABS 0.0 09/05/2016 1202   Iron/TIBC/Ferritin/ %Sat    Component Value Date/Time   FERRITIN 37.3 12/29/2014 0856   Lipid Panel     Component Value Date/Time   CHOL 132 09/05/2016 1202   TRIG 139 09/05/2016 1202   HDL 53 09/05/2016 1202   CHOLHDL 2 05/23/2016 0957   VLDL 24.8 05/23/2016 0957   LDLCALC 51 09/05/2016 1202   LDLDIRECT 77.7 12/27/2009 0942   Hepatic  Function Panel     Component Value Date/Time   PROT 6.8 12/20/2016 0941   ALBUMIN 4.4 12/20/2016 0941   AST 28 12/20/2016 0941   ALT 44 (H) 12/20/2016 0941   ALKPHOS 71 12/20/2016 0941   BILITOT 0.4 12/20/2016 0941   BILIDIR 0.1 09/20/2016 0927   IBILI 0.5 10/27/2013 0910      Component Value Date/Time   TSH 0.624 09/05/2016 1202   TSH 0.59 12/21/2014 0956   TSH 0.68 05/13/2014 0927    ASSESSMENT AND PLAN: Vitamin D deficiency - Plan: Vitamin D, Ergocalciferol, (DRISDOL) 50000 units CAPS capsule  Other depression  Class 1 obesity with serious comorbidity and body mass index (BMI) of 34.0 to 34.9 in adult, unspecified obesity type  PLAN:  Vitamin D Deficiency [name] [name] was informed that low vitamin D levels contributes to fatigue and are associated with obesity, breast, and colon cancer. She agrees to continue to take prescription Vit D @50 ,000 IU every week, we will refill for 1 month and will follow up for routine testing of vitamin D, at least 2-3 times per year. She was informed of the risk of over-replacement of vitamin D and agrees to not increase her dose unless he discusses this with [place] first. [name] [name] agrees to follow up with our clinic in 2 weeks.  Depression with Emotional Eating Behaviors We discussed behavior modification techniques today to help [name] [name] identify and decrease emotional eating and depression. She has agreed to continue her medications as prescribed and agreed to follow up as directed.  Obesity [name] [name] is currently in the action stage of change. As such,  her goal is to maintain while she works on strategies to improve motivation to change She has agreed to follow the Category 2 plan [name] [name] has been instructed to work up to a goal of 150 minutes of combined cardio and strengthening exercise per week for weight loss and overall health benefits. We discussed the following Behavioral Modification Strategies today: meal planning & cooking strategies, increasing lean  protein intake and emotional eating strategies  [name] [name] has agreed to follow up with our clinic in 2 weeks. She was informed of the importance of frequent follow up visits to maximize her success with intensive lifestyle modifications for her multiple health conditions.  I, Doreene Nest, am acting as transcriptionist for [name] Nip, MD  I have reviewed the above documentation for accuracy and completeness, and I agree with the above. -[name] Nip, MD  OBESITY BEHAVIORAL INTERVENTION VISIT  Today's visit was # 8 out of 22.  Starting weight: 197 lbs Starting date: 09/05/16 Today's weight : 197 lbs Today's date: 01/03/2017 Total lbs lost to date: 0 (Patients must lose 7 lbs in the first 6 months to continue with counseling)   ASK: We discussed the diagnosis of obesity with [name] [name] today and [name] [name] agreed to give [place] permission to discuss obesity behavioral modification therapy today.  ASSESS: [place] has the diagnosis of obesity and her BMI today is 50 Versie is in the action stage of change   ADVISE: [name] [name] was educated on the multiple health risks of obesity as well as the benefit of weight loss to improve her health. She was advised of the need for long term treatment and the importance of lifestyle modifications.  AGREE: Multiple dietary modification options and treatment options were discussed and  [name] [name] agreed to follow the Category 2 plan We discussed the following Behavioral Modification Strategies today: meal planning & cooking strategies, increasing lean protein intake and emotional eating strategies

## 2017-01-04 ENCOUNTER — Other Ambulatory Visit (INDEPENDENT_AMBULATORY_CARE_PROVIDER_SITE_OTHER): Payer: Self-pay | Admitting: Family Medicine

## 2017-01-04 DIAGNOSIS — E559 Vitamin D deficiency, unspecified: Secondary | ICD-10-CM

## 2017-01-12 ENCOUNTER — Other Ambulatory Visit (INDEPENDENT_AMBULATORY_CARE_PROVIDER_SITE_OTHER): Payer: Self-pay | Admitting: Family Medicine

## 2017-01-12 DIAGNOSIS — E119 Type 2 diabetes mellitus without complications: Secondary | ICD-10-CM

## 2017-01-14 ENCOUNTER — Encounter (INDEPENDENT_AMBULATORY_CARE_PROVIDER_SITE_OTHER): Payer: Self-pay

## 2017-01-14 ENCOUNTER — Telehealth (INDEPENDENT_AMBULATORY_CARE_PROVIDER_SITE_OTHER): Payer: Self-pay

## 2017-01-15 ENCOUNTER — Encounter (INDEPENDENT_AMBULATORY_CARE_PROVIDER_SITE_OTHER): Payer: Self-pay

## 2017-01-15 ENCOUNTER — Telehealth (INDEPENDENT_AMBULATORY_CARE_PROVIDER_SITE_OTHER): Payer: Self-pay | Admitting: Family Medicine

## 2017-01-15 ENCOUNTER — Telehealth (INDEPENDENT_AMBULATORY_CARE_PROVIDER_SITE_OTHER): Payer: Self-pay

## 2017-01-15 ENCOUNTER — Other Ambulatory Visit (INDEPENDENT_AMBULATORY_CARE_PROVIDER_SITE_OTHER): Payer: Self-pay

## 2017-01-15 DIAGNOSIS — E119 Type 2 diabetes mellitus without complications: Secondary | ICD-10-CM

## 2017-01-15 MED ORDER — LIRAGLUTIDE 18 MG/3ML ~~LOC~~ SOPN: 1.8000 mg | PEN_INJECTOR | Freq: Every day | SUBCUTANEOUS | 0 refills | Status: DC

## 2017-01-15 MED ORDER — INSULIN PEN NEEDLE 32G X 4 MM MISC: 0 refills | Status: DC

## 2017-01-15 NOTE — Telephone Encounter (Signed)
[name] [name] left a message and stated that she is out of Victoza and needs a refill before her appointment on 8/9.  Per Connye Burkitt - front desk

## 2017-01-15 NOTE — Telephone Encounter (Signed)
Vaughan Basta, This request has been granted.  Refill was sent to [facility] in [place] and [place] called Rickell back to let her know.  Thank You  Mandie

## 2017-01-15 NOTE — Telephone Encounter (Signed)
victoza needs refill not enough to last until Thursday  [address], [place]

## 2017-01-17 ENCOUNTER — Ambulatory Visit (INDEPENDENT_AMBULATORY_CARE_PROVIDER_SITE_OTHER): Payer: 59 | Admitting: Physician Assistant

## 2017-01-17 VITALS — BP 145/88 | HR 60 | Temp 98.4°F | Ht 63.0 in | Wt 197.0 lb

## 2017-01-17 DIAGNOSIS — Z6835 Body mass index (BMI) 35.0-35.9, adult: Secondary | ICD-10-CM | POA: Diagnosis not present

## 2017-01-17 DIAGNOSIS — K5909 Other constipation: Secondary | ICD-10-CM | POA: Diagnosis not present

## 2017-01-17 DIAGNOSIS — E669 Obesity, unspecified: Secondary | ICD-10-CM

## 2017-01-17 DIAGNOSIS — Z9189 Other specified personal risk factors, not elsewhere classified: Secondary | ICD-10-CM | POA: Diagnosis not present

## 2017-01-17 DIAGNOSIS — IMO0001 Reserved for inherently not codable concepts without codable children: Secondary | ICD-10-CM

## 2017-01-17 DIAGNOSIS — I10 Essential (primary) hypertension: Secondary | ICD-10-CM | POA: Diagnosis not present

## 2017-01-17 MED ORDER — POLYETHYLENE GLYCOL 3350 17 GM/SCOOP PO POWD: 17.0000 g | Freq: Every day | ORAL | 0 refills | Status: DC

## 2017-01-17 NOTE — Telephone Encounter (Signed)
Spoke to Preston, informed her that a refill has been sent through to [facility] in [place] for her Victoza and her Nano Needles. per verbal ok from Dr. Leafy Ro

## 2017-01-17 NOTE — Progress Notes (Signed)
Office: [phone]  /  Fax: [phone]   HPI:   Chief Complaint: OBESITY [name] [name] is here to discuss her progress with her obesity treatment plan. She is on the Category 2 plan and is following her eating plan approximately 20 % of the time. She states she is exercising 0 minutes 0 times per week. [name] [name] maintained her weight well. She has been eating out more and not planning ahead as well due to her busy work schedule. Her weight is 197 lb (89.4 kg) today and has maintained weight over a period of 2 weeks since her last visit. She has lost 0 lbs since starting treatment with [place].  Hypertension [name] [name] is a 66 y.o. female with hypertension and she didn't take her blood pressure medication this morning.  [name] [name] denies chest pain or shortness of breath on exertion. She is working weight loss to help control her blood pressure with the goal of decreasing her risk of heart attack and stroke. [name] [name] blood pressure is not currently controlled.  Constipation [name] [name] notes constipation for the last few weeks, worse since attempting weight loss. She states BM are less frequent and are not hard and painful. She denies hematochezia or melena.   At risk for cardiovascular disease [name] [name] is at a higher than average risk for cardiovascular disease due to obesity and hypertension. She currently denies any chest pain.  ALLERGIES: No Known Allergies  MEDICATIONS: Current Outpatient Prescriptions on File Prior to Visit  Medication Sig Dispense Refill  . ARIPiprazole (ABILIFY) 5 MG tablet Take 5 mg by mouth daily.      Marland Kitchen aspirin 81 MG tablet Take 81 mg by mouth daily.      Marland Kitchen buPROPion (WELLBUTRIN XL) 150 MG 24 hr tablet Take 450 mg by mouth daily.      . Calcium Carbonate-Vitamin D (CALTRATE 600+D) 600-400 MG-UNIT per tablet Take 1 tablet by mouth 2 (two) times daily.    Marland Kitchen glucose blood (ONE TOUCH ULTRA TEST) test strip 1 each by Other route 3 (three) times daily. for testing 100 each 0  .  hydrochlorothiazide (MICROZIDE) 12.5 MG capsule Take 1 capsule (12.5 mg total) by mouth daily. 30 capsule 3  . Insulin Pen Needle (BD PEN NEEDLE NANO U/F) 32G X 4 MM MISC USE ONCE DAILY WITH INSULIN INJECTION 100 each 0  . Lancets (ONETOUCH ULTRASOFT) lancets TEST BLOOD SUGAR ONCE DAILY 100 each 0  . liraglutide 18 MG/3ML SOPN Inject 0.3 mLs (1.8 mg total) into the skin daily. 3 pen 0  . metFORMIN (GLUCOPHAGE) 500 MG tablet Take 1 tablet (500 mg total) by mouth 2 (two) times daily with a meal. 180 tablet 3  . metoprolol tartrate (LOPRESSOR) 50 MG tablet Take 1 tablet (50 mg total) by mouth 2 (two) times daily. 60 tablet 3  . Multiple Vitamins-Minerals (MULTIVITAMIN ADULTS 50+ PO) Take 1 tablet by mouth daily.    Marland Kitchen olmesartan-hydrochlorothiazide (BENICAR HCT) 20-12.5 MG tablet Take 0.5 tablets by mouth daily. 45 tablet 1  . pantoprazole (PROTONIX) 40 MG tablet Take 1 tablet (40 mg total) by mouth daily. 30 tablet 5  . Probiotic Product (PROBIOTIC DAILY PO) Take 1 capsule by mouth daily.    . rosuvastatin (CRESTOR) 20 MG tablet take 1 tablet by mouth once daily 30 tablet 5  . terbinafine (LAMISIL) 250 MG tablet Take 1 tablet (250 mg total) by mouth daily. 30 tablet 1  . Vitamin D, Ergocalciferol, (DRISDOL) 50000 units CAPS capsule Take 1 capsule (50,000  Units total) by mouth every 7 (seven) days. 4 capsule 0   No current facility-administered medications on file prior to visit.     PAST MEDICAL HISTORY: Past Medical History:  Diagnosis Date  . Allergic rhinitis, cause unspecified   . ASCUS on Pap smear   . Back pain   . CAD (coronary artery disease)   . Constipation   . Depression   . Depressive disorder, not elsewhere classified   . Esophageal reflux   . Fatty liver 04/26/2015  . GERD (gastroesophageal reflux disease)   . Hyperlipidemia   . Hypertension   . Obstructive sleep apnea (adult) (pediatric)   . Personal history of goiter    childhood  . Type II or unspecified type diabetes  mellitus without mention of complication, not stated as uncontrolled     PAST SURGICAL HISTORY: Past Surgical History:  Procedure Laterality Date  . BUNIONECTOMY    . colposcopy  2012   cervical biopsy (Dr. Benjie Karvonen)  . CORONARY STENT PLACEMENT  2004  . goiter removal  1968  . TUBAL LIGATION  1985    SOCIAL HISTORY: Social History  Substance Use Topics  . Smoking status: Former Smoker    Years: 46.00    Types: E-cigarettes    Quit date: 09/23/2012  . Smokeless tobacco: Never Used     Comment: Uses vapor cigarette  . Alcohol use 0.0 oz/week     Comment: occasional    FAMILY HISTORY: Family History  Problem Relation Age of Onset  . Cancer Mother        throat, cervical  . Diabetes Mother   . Stroke Mother   . Heart attack Mother   . Heart disease Mother   . Hypertension Mother   . Thyroid disease Mother   . Alcoholism Mother   . Cancer Father        prostate  . Diabetes Sister   . Heart attack Sister   . Hypertension Sister   . Hyperlipidemia Sister   . Colon cancer Neg Hx     ROS: Review of Systems  Constitutional: Negative for weight loss.  Respiratory: Negative for shortness of breath (on exertion).   Cardiovascular: Negative for chest pain.  Gastrointestinal: Positive for constipation. Negative for blood in stool and melena.    PHYSICAL EXAM: Blood pressure (!) 145/88, pulse 60, temperature 98.4 F (36.9 C), temperature source Oral, height 5\' 3"  (1.6 m), weight 197 lb (89.4 kg), SpO2 96 %. Body mass index is 34.9 kg/m. Physical Exam  Constitutional: She is oriented to person, place, and time. She appears well-developed and well-nourished.  Cardiovascular: Normal rate.   Pulmonary/Chest: Effort normal.  Musculoskeletal: Normal range of motion.  Neurological: She is oriented to person, place, and time.  Skin: Skin is warm and dry.  Psychiatric: She has a normal mood and affect. Her behavior is normal.  Vitals reviewed.   RECENT LABS AND  TESTS: BMET    Component Value Date/Time   NA 142 12/20/2016 0941   K 4.1 12/20/2016 0941   CL 101 12/20/2016 0941   CO2 24 12/20/2016 0941   GLUCOSE 115 (H) 12/20/2016 0941   GLUCOSE 159 (H) 08/22/2016 0906   BUN 15 12/20/2016 0941   CREATININE 0.74 12/20/2016 0941   CREATININE 0.70 12/15/2014 1949   CALCIUM 10.0 12/20/2016 0941   GFRNONAA 85 12/20/2016 0941   GFRNONAA 80 10/27/2013 0910   GFRAA 98 12/20/2016 0941   GFRAA >89 10/27/2013 0910   Lab Results  Component  Value Date   HGBA1C 6.3 (H) 12/20/2016   HGBA1C 7.1 (H) 09/05/2016   HGBA1C 7.0 (H) 08/22/2016   HGBA1C 6.7 (H) 05/23/2016   HGBA1C 6.2 02/15/2016   Lab Results  Component Value Date   INSULIN 62.0 (H) 12/20/2016   INSULIN 66.8 (H) 09/05/2016   CBC    Component Value Date/Time   WBC 5.1 09/05/2016 1202   WBC 4.6 12/21/2014 0956   RBC 4.75 09/05/2016 1202   RBC 4.54 12/21/2014 0956   HGB 13.3 09/05/2016 1202   HCT 42.1 09/05/2016 1202   PLT 159.0 12/21/2014 0956   MCV 89 09/05/2016 1202   MCH 28.0 09/05/2016 1202   MCH 29.2 05/09/2011 0931   MCHC 31.6 09/05/2016 1202   MCHC 34.0 12/21/2014 0956   RDW 15.7 (H) 09/05/2016 1202   LYMPHSABS 1.7 09/05/2016 1202   MONOABS 0.2 12/21/2014 0956   EOSABS 0.1 09/05/2016 1202   BASOSABS 0.0 09/05/2016 1202   Iron/TIBC/Ferritin/ %Sat    Component Value Date/Time   FERRITIN 37.3 12/29/2014 0856   Lipid Panel     Component Value Date/Time   CHOL 132 09/05/2016 1202   TRIG 139 09/05/2016 1202   HDL 53 09/05/2016 1202   CHOLHDL 2 05/23/2016 0957   VLDL 24.8 05/23/2016 0957   LDLCALC 51 09/05/2016 1202   LDLDIRECT 77.7 12/27/2009 0942   Hepatic Function Panel     Component Value Date/Time   PROT 6.8 12/20/2016 0941   ALBUMIN 4.4 12/20/2016 0941   AST 28 12/20/2016 0941   ALT 44 (H) 12/20/2016 0941   ALKPHOS 71 12/20/2016 0941   BILITOT 0.4 12/20/2016 0941   BILIDIR 0.1 09/20/2016 0927   IBILI 0.5 10/27/2013 0910      Component Value  Date/Time   TSH 0.624 09/05/2016 1202   TSH 0.59 12/21/2014 0956   TSH 0.68 05/13/2014 0927    ASSESSMENT AND PLAN: Essential hypertension  Other constipation - Plan: polyethylene glycol powder (GLYCOLAX/MIRALAX) powder  At risk for heart disease  Class 2 obesity with serious comorbidity and body mass index (BMI) of 35.0 to 35.9 in adult, unspecified obesity type  PLAN:  Hypertension We discussed sodium restriction, working on healthy weight loss, and a regular exercise program as the means to achieve improved blood pressure control. [name] [name] agreed with this plan and agreed to follow up as directed. We will continue to monitor her blood pressure as well as her progress with the above lifestyle modifications. She will continue her medications as prescribed and will watch for signs of hypotension as she continues her lifestyle modifications.  Cardiovascular risk counselling [name] [name] was given extended (15 minutes) coronary artery disease prevention counseling today. She is 66 y.o. female and has risk factors for heart disease including obesity and hypertension. We discussed intensive lifestyle modifications today with an emphasis on specific weight loss instructions and strategies. Pt was also informed of the importance of increasing exercise and decreasing saturated fats to help prevent heart disease.  Constipation [name] [name] was informed decrease bowel movement frequency is normal while losing weight, but stools should not be hard or painful. She was advised to increase her H20 intake and work on increasing her fiber intake. High fiber foods were discussed today. [name] [name] agrees to start Miralax 1 capful daily and will follow up with our clinic in 2 weeks./  Obesity [name] [name] is currently in the action stage of change. As such, her goal is to continue with weight loss efforts She has agreed to follow the Category 2  plan [name] [name] has been instructed to work up to a goal of 150 minutes of combined cardio and  strengthening exercise per week for weight loss and overall health benefits. We discussed the following Behavioral Modification Strategies today: increasing lean protein intake and decrease eating out  [name] [name] has agreed to follow up with our clinic in 2 weeks. She was informed of the importance of frequent follow up visits to maximize her success with intensive lifestyle modifications for her multiple health conditions.  [name] [name], [name] [name], am acting as transcriptionist for [name] Duverney, PA-C  [name] [name] have reviewed the above documentation for accuracy and completeness, and [name] [name] agree with the above. -[name] Duverney, PA-C  [name] [name] have reviewed the above note and agree with the plan. -[name] Nip, MD  OBESITY BEHAVIORAL INTERVENTION VISIT  Today's visit was # 9 out of 22.  Starting weight: 197 lbs Starting date: 09/05/16 Today's weight : 197 lbs  Today's date: 01/17/2017 Total lbs lost to date: 0 (Patients must lose 7 lbs in the first 6 months to continue with counseling)   ASK: We discussed the diagnosis of obesity with [name] [name] today and Shaila agreed to give [place] permission to discuss obesity behavioral modification therapy today.  ASSESS: Tanijah has the diagnosis of obesity and her BMI today is 38 Laterra is in the action stage of change   ADVISE: Domanique was educated on the multiple health risks of obesity as well as the benefit of weight loss to improve her health. She was advised of the need for long term treatment and the importance of lifestyle modifications.  AGREE: Multiple dietary modification options and treatment options were discussed and  Evalisse agreed to follow the Category 2 plan We discussed the following Behavioral Modification Strategies today: increasing lean protein intake and decrease eating out

## 2017-01-29 ENCOUNTER — Ambulatory Visit (INDEPENDENT_AMBULATORY_CARE_PROVIDER_SITE_OTHER): Payer: 59 | Admitting: Physician Assistant

## 2017-01-29 VITALS — BP 164/84 | HR 60 | Temp 98.0°F | Ht 63.0 in | Wt 197.0 lb

## 2017-01-29 DIAGNOSIS — E119 Type 2 diabetes mellitus without complications: Secondary | ICD-10-CM | POA: Diagnosis not present

## 2017-01-29 DIAGNOSIS — IMO0001 Reserved for inherently not codable concepts without codable children: Secondary | ICD-10-CM

## 2017-01-29 DIAGNOSIS — E559 Vitamin D deficiency, unspecified: Secondary | ICD-10-CM

## 2017-01-29 DIAGNOSIS — Z6835 Body mass index (BMI) 35.0-35.9, adult: Secondary | ICD-10-CM

## 2017-01-29 DIAGNOSIS — E669 Obesity, unspecified: Secondary | ICD-10-CM

## 2017-01-29 MED ORDER — VITAMIN D (ERGOCALCIFEROL) 1.25 MG (50000 UNIT) PO CAPS: 50000.0000 [IU] | ORAL_CAPSULE | ORAL | 0 refills | Status: DC

## 2017-01-29 MED ORDER — LIRAGLUTIDE 18 MG/3ML ~~LOC~~ SOPN: 1.8000 mg | PEN_INJECTOR | Freq: Every day | SUBCUTANEOUS | 0 refills | Status: DC

## 2017-01-29 NOTE — Progress Notes (Addendum)
Office: [phone]  /  Fax: [phone]   HPI:   Chief Complaint: OBESITY [name] [name] is here to discuss her progress with her obesity treatment plan. She is on the Category 2 plan and is following her eating plan approximately 30 to 40 % of the time. She states she is walking a lot at the beach. [name] [name] maintained her weight over vacation. She makes smarter food choices and controls her portions. [name] [name] has a hard time following the plan for dinner. Hunger is well controlled. Her weight is 197 lb (89.4 kg) today and has maintained weight over a period of 2 weeks since her last visit. She has lost 0 lbs since starting treatment with [place].  Vitamin D deficiency [name] [name] has a diagnosis of vitamin D deficiency. She is currently taking vit D and denies nausea, vomiting or muscle weakness.  Diabetes II [name] [name] has a diagnosis of diabetes type II. [name] [name] states fasting BGs range in the 180's and denies any hypoglycemic episodes. She has been working on intensive lifestyle modifications including diet, exercise, and weight loss to help control her blood glucose levels.   ALLERGIES: No Known Allergies  MEDICATIONS: Current Outpatient Prescriptions on File Prior to Visit  Medication Sig Dispense Refill   ARIPiprazole (ABILIFY) 5 MG tablet Take 5 mg by mouth daily.       aspirin 81 MG tablet Take 81 mg by mouth daily.       buPROPion (WELLBUTRIN XL) 150 MG 24 hr tablet Take 450 mg by mouth daily.       Calcium Carbonate-Vitamin D (CALTRATE 600+D) 600-400 MG-UNIT per tablet Take 1 tablet by mouth 2 (two) times daily.     glucose blood (ONE TOUCH ULTRA TEST) test strip 1 each by Other route 3 (three) times daily. for testing 100 each 0   hydrochlorothiazide (MICROZIDE) 12.5 MG capsule Take 1 capsule (12.5 mg total) by mouth daily. 30 capsule 3   Insulin Pen Needle (BD PEN NEEDLE NANO U/F) 32G X 4 MM MISC USE ONCE DAILY WITH INSULIN INJECTION 100 each 0   Lancets (ONETOUCH ULTRASOFT) lancets TEST  BLOOD SUGAR ONCE DAILY 100 each 0   liraglutide 18 MG/3ML SOPN Inject 0.3 mLs (1.8 mg total) into the skin daily. 3 pen 0   metFORMIN (GLUCOPHAGE) 500 MG tablet Take 1 tablet (500 mg total) by mouth 2 (two) times daily with a meal. 180 tablet 3   metoprolol tartrate (LOPRESSOR) 50 MG tablet Take 1 tablet (50 mg total) by mouth 2 (two) times daily. 60 tablet 3   Multiple Vitamins-Minerals (MULTIVITAMIN ADULTS 50+ PO) Take 1 tablet by mouth daily.     olmesartan-hydrochlorothiazide (BENICAR HCT) 20-12.5 MG tablet Take 0.5 tablets by mouth daily. 45 tablet 1   pantoprazole (PROTONIX) 40 MG tablet Take 1 tablet (40 mg total) by mouth daily. 30 tablet 5   polyethylene glycol powder (GLYCOLAX/MIRALAX) powder Take 17 g by mouth daily. 3350 g 0   Probiotic Product (PROBIOTIC DAILY PO) Take 1 capsule by mouth daily.     rosuvastatin (CRESTOR) 20 MG tablet take 1 tablet by mouth once daily 30 tablet 5   terbinafine (LAMISIL) 250 MG tablet Take 1 tablet (250 mg total) by mouth daily. 30 tablet 1   Vitamin D, Ergocalciferol, (DRISDOL) 50000 units CAPS capsule Take 1 capsule (50,000 Units total) by mouth every 7 (seven) days. 4 capsule 0   No current facility-administered medications on file prior to visit.     PAST MEDICAL HISTORY: Past Medical History:  Diagnosis Date   Allergic rhinitis, cause unspecified    ASCUS on Pap smear    Back pain    CAD (coronary artery disease)    Constipation    Depression    Depressive disorder, not elsewhere classified    Esophageal reflux    Fatty liver 04/26/2015   GERD (gastroesophageal reflux disease)    Hyperlipidemia    Hypertension    Obstructive sleep apnea (adult) (pediatric)    Personal history of goiter    childhood   Type II or unspecified type diabetes mellitus without mention of complication, not stated as uncontrolled     PAST SURGICAL HISTORY: Past Surgical History:  Procedure Laterality Date   BUNIONECTOMY      colposcopy  2012   cervical biopsy (Dr. Benjie Karvonen)   CORONARY STENT PLACEMENT  2004   goiter removal  1968   TUBAL LIGATION  1985    SOCIAL HISTORY: Social History  Substance Use Topics   Smoking status: Former Smoker    Years: 46.00    Types: E-cigarettes    Quit date: 09/23/2012   Smokeless tobacco: Never Used     Comment: Uses vapor cigarette   Alcohol use 0.0 oz/week     Comment: occasional    FAMILY HISTORY: Family History  Problem Relation Age of Onset   Cancer Mother        throat, cervical   Diabetes Mother    Stroke Mother    Heart attack Mother    Heart disease Mother    Hypertension Mother    Thyroid disease Mother    Alcoholism Mother    Cancer Father        prostate   Diabetes Sister    Heart attack Sister    Hypertension Sister    Hyperlipidemia Sister    Colon cancer Neg Hx     ROS: Review of Systems  Constitutional: Negative for weight loss.  Gastrointestinal: Negative for nausea and vomiting.  Musculoskeletal:       Negative muscle weakness  Endo/Heme/Allergies:       Negative hypoglycemia    PHYSICAL EXAM: Blood pressure (!) 164/84, pulse 60, temperature 98 F (36.7 C), temperature source Oral, height 5\' 3"  (1.6 m), weight 197 lb (89.4 kg), SpO2 98 %. Body mass index is 34.9 kg/m. Physical Exam  Constitutional: She is oriented to person, place, and time. She appears well-developed and well-nourished.  Cardiovascular: Normal rate.   Pulmonary/Chest: Effort normal.  Musculoskeletal: Normal range of motion.  Neurological: She is oriented to person, place, and time.  Skin: Skin is warm and dry.  Psychiatric: She has a normal mood and affect. Her behavior is normal.  Vitals reviewed.   RECENT LABS AND TESTS: BMET    Component Value Date/Time   NA 142 12/20/2016 0941   K 4.1 12/20/2016 0941   CL 101 12/20/2016 0941   CO2 24 12/20/2016 0941   GLUCOSE 115 (H) 12/20/2016 0941   GLUCOSE 159 (H) 08/22/2016 0906   BUN 15  12/20/2016 0941   CREATININE 0.74 12/20/2016 0941   CREATININE 0.70 12/15/2014 1949   CALCIUM 10.0 12/20/2016 0941   GFRNONAA 85 12/20/2016 0941   GFRNONAA 80 10/27/2013 0910   GFRAA 98 12/20/2016 0941   GFRAA >89 10/27/2013 0910   Lab Results  Component Value Date   HGBA1C 6.3 (H) 12/20/2016   HGBA1C 7.1 (H) 09/05/2016   HGBA1C 7.0 (H) 08/22/2016   HGBA1C 6.7 (H) 05/23/2016   HGBA1C 6.2 02/15/2016  Lab Results  Component Value Date   INSULIN 62.0 (H) 12/20/2016   INSULIN 66.8 (H) 09/05/2016   CBC    Component Value Date/Time   WBC 5.1 09/05/2016 1202   WBC 4.6 12/21/2014 0956   RBC 4.75 09/05/2016 1202   RBC 4.54 12/21/2014 0956   HGB 13.3 09/05/2016 1202   HCT 42.1 09/05/2016 1202   PLT 159.0 12/21/2014 0956   MCV 89 09/05/2016 1202   MCH 28.0 09/05/2016 1202   MCH 29.2 05/09/2011 0931   MCHC 31.6 09/05/2016 1202   MCHC 34.0 12/21/2014 0956   RDW 15.7 (H) 09/05/2016 1202   LYMPHSABS 1.7 09/05/2016 1202   MONOABS 0.2 12/21/2014 0956   EOSABS 0.1 09/05/2016 1202   BASOSABS 0.0 09/05/2016 1202   Iron/TIBC/Ferritin/ %Sat    Component Value Date/Time   FERRITIN 37.3 12/29/2014 0856   Lipid Panel     Component Value Date/Time   CHOL 132 09/05/2016 1202   TRIG 139 09/05/2016 1202   HDL 53 09/05/2016 1202   CHOLHDL 2 05/23/2016 0957   VLDL 24.8 05/23/2016 0957   LDLCALC 51 09/05/2016 1202   LDLDIRECT 77.7 12/27/2009 0942   Hepatic Function Panel     Component Value Date/Time   PROT 6.8 12/20/2016 0941   ALBUMIN 4.4 12/20/2016 0941   AST 28 12/20/2016 0941   ALT 44 (H) 12/20/2016 0941   ALKPHOS 71 12/20/2016 0941   BILITOT 0.4 12/20/2016 0941   BILIDIR 0.1 09/20/2016 0927   IBILI 0.5 10/27/2013 0910      Component Value Date/Time   TSH 0.624 09/05/2016 1202   TSH 0.59 12/21/2014 0956   TSH 0.68 05/13/2014 0927    ASSESSMENT AND PLAN: Type 2 diabetes mellitus without complication, without long-term current use of insulin ([place]) - Plan:  liraglutide 18 MG/3ML SOPN  Vitamin D deficiency - Plan: Vitamin D, Ergocalciferol, (DRISDOL) 50000 units CAPS capsule  Class 2 obesity with serious comorbidity and body mass index (BMI) of 35.0 to 35.9 in adult, unspecified obesity type  PLAN:  Vitamin D Deficiency Zikeria was informed that low vitamin D levels contributes to fatigue and are associated with obesity, breast, and colon cancer. She agrees to continue to take prescription Vit D @50 ,000 IU every week, we will refill for 1 month and will follow up for routine testing of vitamin D, at least 2-3 times per year. She was informed of the risk of over-replacement of vitamin D and agrees to not increase her dose unless he discusses this with [place] first. Lakeyn agrees to follow up with our clinic in 2 weeks.  Diabetes II Alyia has been given extensive diabetes education by myself today including ideal fasting and post-prandial blood glucose readings, individual ideal Hgb A1c goals and hypoglycemia prevention. We discussed the importance of good blood sugar control to decrease the likelihood of diabetic complications such as nephropathy, neuropathy, limb loss, blindness, coronary artery disease, and death. We discussed the importance of intensive lifestyle modification including diet, exercise and weight loss as the first line treatment for diabetes. Nichole agrees to continue Victoza 1.8 mg daily, we will refill 3 pens and she will follow up at the agreed upon time.  Obesity Jolinda is currently in the action stage of change. As such, her goal is to continue with weight loss efforts She has agreed to follow the Category 2 plan Linzee has been instructed to work up to a goal of 150 minutes of combined cardio and strengthening exercise per week for weight loss  and overall health benefits. We discussed the following Behavioral Modification Strategies today: planning for success, increasing lean protein intake and emotional eating strategies We discussed  various medication options to help Carita with her weight loss efforts and we both agreed to continue Victoza  Jaionna has agreed to follow up with our clinic in 2 weeks. She was informed of the importance of frequent follow up visits to maximize her success with intensive lifestyle modifications for her multiple health conditions.  I, Doreene Nest, am acting as transcriptionist for Lacy Duverney, PA-C  I have reviewed the above documentation for accuracy and completeness, and I agree with the above. -Lacy Duverney, PA-C    OBESITY BEHAVIORAL INTERVENTION VISIT  Today's visit was # 10 out of 22.  Starting weight: 197 lbs Starting date: 09/05/16 Today's weight : 197 lbs  Today's date: 01/29/2017 Total lbs lost to date: 0 (Patients must lose 7 lbs in the first 6 months to continue with counseling)   ASK: We discussed the diagnosis of obesity with Iven Finn today and Sharlot agreed to give [place] permission to discuss obesity behavioral modification therapy today.  ASSESS: Sanika has the diagnosis of obesity and her BMI today is 34.91 Fritzie is in the action stage of change   ADVISE: Mishika was educated on the multiple health risks of obesity as well as the benefit of weight loss to improve her health. She was advised of the need for long term treatment and the importance of lifestyle modifications.  AGREE: Multiple dietary modification options and treatment options were discussed and  Renea agreed to follow the Category 2 plan We discussed the following Behavioral Modification Strategies today: planning for success, increasing lean protein intake and emotional eating strategies

## 2017-02-13 ENCOUNTER — Ambulatory Visit (INDEPENDENT_AMBULATORY_CARE_PROVIDER_SITE_OTHER): Payer: 59 | Admitting: Physician Assistant

## 2017-02-13 VITALS — BP 137/72 | HR 61 | Temp 98.3°F | Ht 63.0 in | Wt 197.0 lb

## 2017-02-13 DIAGNOSIS — Z6834 Body mass index (BMI) 34.0-34.9, adult: Secondary | ICD-10-CM | POA: Diagnosis not present

## 2017-02-13 DIAGNOSIS — E119 Type 2 diabetes mellitus without complications: Secondary | ICD-10-CM

## 2017-02-13 DIAGNOSIS — E669 Obesity, unspecified: Secondary | ICD-10-CM

## 2017-02-13 NOTE — Progress Notes (Signed)
Office: [phone]  /  Fax: [phone]   HPI:   Chief Complaint: OBESITY [name] [name] is here to discuss her progress with her obesity treatment plan. She is on the Category 2 plan and is following her eating plan approximately 50 % of the time. She states she is exercising 0 minutes 0 times per week. [name] [name] has maintained her weight and has been making smarter food choices but struggles with planning ahead all of her meals. She states she will have a better work schedule which would give her more time to plan ahead. Her weight is 197 lb (89.4 kg) today and has not lost weight since her last visit. She has lost 0 lbs since starting treatment with [place].  Diabetes II [name] [name] has a diagnosis of diabetes type II. [name] [name]'s fasting BGs range in 180's and she denies any hypoglycemic episodes. Last A1c was 6.3 on 12/20/16.   She has been working on intensive lifestyle modifications including diet, exercise, and weight loss to help control her blood glucose levels.  ALLERGIES: No Known Allergies  MEDICATIONS: Current Outpatient Prescriptions on File Prior to Visit  Medication Sig Dispense Refill  . aspirin 81 MG tablet Take 81 mg by mouth daily.      Marland Kitchen buPROPion (WELLBUTRIN XL) 150 MG 24 hr tablet Take 450 mg by mouth daily.      . Calcium Carbonate-Vitamin D (CALTRATE 600+D) 600-400 MG-UNIT per tablet Take 1 tablet by mouth 2 (two) times daily.    Marland Kitchen glucose blood (ONE TOUCH ULTRA TEST) test strip 1 each by Other route 3 (three) times daily. for testing 100 each 0  . hydrochlorothiazide (MICROZIDE) 12.5 MG capsule Take 1 capsule (12.5 mg total) by mouth daily. 30 capsule 3  . Insulin Pen Needle (BD PEN NEEDLE NANO U/F) 32G X 4 MM MISC USE ONCE DAILY WITH INSULIN INJECTION 100 each 0  . Lancets (ONETOUCH ULTRASOFT) lancets TEST BLOOD SUGAR ONCE DAILY 100 each 0  . liraglutide 18 MG/3ML SOPN Inject 0.3 mLs (1.8 mg total) into the skin daily. 3 pen 0  . metFORMIN (GLUCOPHAGE) 500 MG tablet Take 1 tablet  (500 mg total) by mouth 2 (two) times daily with a meal. 180 tablet 3  . metoprolol tartrate (LOPRESSOR) 50 MG tablet Take 1 tablet (50 mg total) by mouth 2 (two) times daily. 60 tablet 3  . Multiple Vitamins-Minerals (MULTIVITAMIN ADULTS 50+ PO) Take 1 tablet by mouth daily.    Marland Kitchen olmesartan-hydrochlorothiazide (BENICAR HCT) 20-12.5 MG tablet Take 0.5 tablets by mouth daily. 45 tablet 1  . pantoprazole (PROTONIX) 40 MG tablet Take 1 tablet (40 mg total) by mouth daily. 30 tablet 5  . polyethylene glycol powder (GLYCOLAX/MIRALAX) powder Take 17 g by mouth daily. 3350 g 0  . Probiotic Product (PROBIOTIC DAILY PO) Take 1 capsule by mouth daily.    . rosuvastatin (CRESTOR) 20 MG tablet take 1 tablet by mouth once daily 30 tablet 5  . Vitamin D, Ergocalciferol, (DRISDOL) 50000 units CAPS capsule Take 1 capsule (50,000 Units total) by mouth every 7 (seven) days. 4 capsule 0   No current facility-administered medications on file prior to visit.     PAST MEDICAL HISTORY: Past Medical History:  Diagnosis Date  . Allergic rhinitis, cause unspecified   . ASCUS on Pap smear   . Back pain   . CAD (coronary artery disease)   . Constipation   . Depression   . Depressive disorder, not elsewhere classified   . Esophageal reflux   .  Fatty liver 04/26/2015  . GERD (gastroesophageal reflux disease)   . Hyperlipidemia   . Hypertension   . Obstructive sleep apnea (adult) (pediatric)   . Personal history of goiter    childhood  . Type II or unspecified type diabetes mellitus without mention of complication, not stated as uncontrolled     PAST SURGICAL HISTORY: Past Surgical History:  Procedure Laterality Date  . BUNIONECTOMY    . colposcopy  2012   cervical biopsy (Dr. Benjie Karvonen)  . CORONARY STENT PLACEMENT  2004  . goiter removal  1968  . TUBAL LIGATION  1985    SOCIAL HISTORY: Social History  Substance Use Topics  . Smoking status: Former Smoker    Years: 46.00    Types: E-cigarettes    Quit  date: 09/23/2012  . Smokeless tobacco: Never Used     Comment: Uses vapor cigarette  . Alcohol use 0.0 oz/week     Comment: occasional    FAMILY HISTORY: Family History  Problem Relation Age of Onset  . Cancer Mother        throat, cervical  . Diabetes Mother   . Stroke Mother   . Heart attack Mother   . Heart disease Mother   . Hypertension Mother   . Thyroid disease Mother   . Alcoholism Mother   . Cancer Father        prostate  . Diabetes Sister   . Heart attack Sister   . Hypertension Sister   . Hyperlipidemia Sister   . Colon cancer Neg Hx     ROS: Review of Systems  Constitutional: Negative for weight loss.  Endo/Heme/Allergies:       Negative hypoglycemia    PHYSICAL EXAM: Blood pressure 137/72, pulse 61, temperature 98.3 F (36.8 C), temperature source Oral, height 5\' 3"  (1.6 m), weight 197 lb (89.4 kg), SpO2 97 %. Body mass index is 34.9 kg/m. Physical Exam  Constitutional: She is oriented to person, place, and time. She appears well-developed and well-nourished.  Cardiovascular: Normal rate.   Pulmonary/Chest: Effort normal.  Musculoskeletal: Normal range of motion.  Neurological: She is oriented to person, place, and time.  Skin: Skin is warm and dry.  Psychiatric: She has a normal mood and affect. Her behavior is normal.  Vitals reviewed.   RECENT LABS AND TESTS: BMET    Component Value Date/Time   NA 142 12/20/2016 0941   K 4.1 12/20/2016 0941   CL 101 12/20/2016 0941   CO2 24 12/20/2016 0941   GLUCOSE 115 (H) 12/20/2016 0941   GLUCOSE 159 (H) 08/22/2016 0906   BUN 15 12/20/2016 0941   CREATININE 0.74 12/20/2016 0941   CREATININE 0.70 12/15/2014 1949   CALCIUM 10.0 12/20/2016 0941   GFRNONAA 85 12/20/2016 0941   GFRNONAA 80 10/27/2013 0910   GFRAA 98 12/20/2016 0941   GFRAA >89 10/27/2013 0910   Lab Results  Component Value Date   HGBA1C 6.3 (H) 12/20/2016   HGBA1C 7.1 (H) 09/05/2016   HGBA1C 7.0 (H) 08/22/2016   HGBA1C 6.7 (H)  05/23/2016   HGBA1C 6.2 02/15/2016   Lab Results  Component Value Date   INSULIN 62.0 (H) 12/20/2016   INSULIN 66.8 (H) 09/05/2016   CBC    Component Value Date/Time   WBC 5.1 09/05/2016 1202   WBC 4.6 12/21/2014 0956   RBC 4.75 09/05/2016 1202   RBC 4.54 12/21/2014 0956   HGB 13.3 09/05/2016 1202   HCT 42.1 09/05/2016 1202   PLT 159.0 12/21/2014 0956  MCV 89 09/05/2016 1202   MCH 28.0 09/05/2016 1202   MCH 29.2 05/09/2011 0931   MCHC 31.6 09/05/2016 1202   MCHC 34.0 12/21/2014 0956   RDW 15.7 (H) 09/05/2016 1202   LYMPHSABS 1.7 09/05/2016 1202   MONOABS 0.2 12/21/2014 0956   EOSABS 0.1 09/05/2016 1202   BASOSABS 0.0 09/05/2016 1202   Iron/TIBC/Ferritin/ %Sat    Component Value Date/Time   FERRITIN 37.3 12/29/2014 0856   Lipid Panel     Component Value Date/Time   CHOL 132 09/05/2016 1202   TRIG 139 09/05/2016 1202   HDL 53 09/05/2016 1202   CHOLHDL 2 05/23/2016 0957   VLDL 24.8 05/23/2016 0957   LDLCALC 51 09/05/2016 1202   LDLDIRECT 77.7 12/27/2009 0942   Hepatic Function Panel     Component Value Date/Time   PROT 6.8 12/20/2016 0941   ALBUMIN 4.4 12/20/2016 0941   AST 28 12/20/2016 0941   ALT 44 (H) 12/20/2016 0941   ALKPHOS 71 12/20/2016 0941   BILITOT 0.4 12/20/2016 0941   BILIDIR 0.1 09/20/2016 0927   IBILI 0.5 10/27/2013 0910      Component Value Date/Time   TSH 0.624 09/05/2016 1202   TSH 0.59 12/21/2014 0956   TSH 0.68 05/13/2014 0927    ASSESSMENT AND PLAN: Type 2 diabetes mellitus without complication, without long-term current use of insulin (HCC)  Class 1 obesity with serious comorbidity and body mass index (BMI) of 34.0 to 34.9 in adult, unspecified obesity type  PLAN:  Diabetes II Felina has been given extensive diabetes education by myself today including ideal fasting and post-prandial blood glucose readings, individual ideal HgA1c goals  and hypoglycemia prevention. We discussed the importance of good blood sugar control to  decrease the likelihood of diabetic complications such as nephropathy, neuropathy, limb loss, blindness, coronary artery disease, and death. We discussed the importance of intensive lifestyle modification including diet, exercise and weight loss as the first line treatment for diabetes. Zamya agrees to continue her diabetes medications and will follow up with our clinic in 2 weeks.  We spent > than 50% of the 15 minute visit on the counseling as documented in the note.  Obesity Sharonica is currently in the action stage of change. As such, her goal is to continue with weight loss efforts She has agreed to follow the Category 2 plan Makala has been instructed to work up to a goal of 150 minutes of combined cardio and strengthening exercise per week for weight loss and overall health benefits. We discussed the following Behavioral Modification Strategies today: increasing lean protein intake and work on meal planning and easy cooking plans   Tangala has agreed to follow up with our clinic in 2 weeks. She was informed of the importance of frequent follow up visits to maximize her success with intensive lifestyle modifications for her multiple health conditions.  I, Trixie Dredge, am acting as transcriptionist for Lacy Duverney, PA-C  I have reviewed the above documentation for accuracy and completeness, and I agree with the above. -Lacy Duverney, PA-C  I have reviewed the above note and agree with the plan. -Dennard Nip, MD   OBESITY BEHAVIORAL INTERVENTION VISIT  Today's visit was # 11 out of 22.  Starting weight: 197 lbs  Starting date: 09/05/16 Today's weight: 197 lbs  Today's date: 02/13/17 Total lbs lost to date: 0 (Patients must lose 7 lbs in the first 6 months to continue with counseling)   ASK: We discussed the diagnosis of obesity with Hilda Blades  L Baar today and Chaz agreed to give [place] permission to discuss obesity behavioral modification therapy today.  ASSESS: Erie has the diagnosis of  obesity and her BMI today is 12 Coren is in the action stage of change   ADVISE: Yurianna was educated on the multiple health risks of obesity as well as the benefit of weight loss to improve her health. She was advised of the need for long term treatment and the importance of lifestyle modifications.  AGREE: Multiple dietary modification options and treatment options were discussed and  Lafonda agreed to follow the Category 2 plan We discussed the following Behavioral Modification Strategies today: increasing lean protein intake and work on meal planning and easy cooking plans

## 2017-02-27 ENCOUNTER — Ambulatory Visit (INDEPENDENT_AMBULATORY_CARE_PROVIDER_SITE_OTHER): Payer: 59 | Admitting: Physician Assistant

## 2017-02-27 VITALS — BP 152/85 | HR 63 | Temp 98.2°F | Ht 63.0 in | Wt 195.0 lb

## 2017-02-27 DIAGNOSIS — E669 Obesity, unspecified: Secondary | ICD-10-CM | POA: Diagnosis not present

## 2017-02-27 DIAGNOSIS — Z6834 Body mass index (BMI) 34.0-34.9, adult: Secondary | ICD-10-CM

## 2017-02-27 DIAGNOSIS — E559 Vitamin D deficiency, unspecified: Secondary | ICD-10-CM | POA: Diagnosis not present

## 2017-02-27 DIAGNOSIS — F339 Major depressive disorder, recurrent, unspecified: Secondary | ICD-10-CM | POA: Diagnosis not present

## 2017-02-27 DIAGNOSIS — E119 Type 2 diabetes mellitus without complications: Secondary | ICD-10-CM

## 2017-02-27 MED ORDER — METFORMIN HCL ER 750 MG PO TB24: 750.0000 mg | ORAL_TABLET | Freq: Two times a day (BID) | ORAL | 0 refills | Status: DC

## 2017-02-27 NOTE — Progress Notes (Addendum)
Office: [phone]  /  Fax: [phone]   HPI:   Chief Complaint: OBESITY [name] [name] is here to discuss her progress with her obesity treatment plan. She is on the Category 2 plan and is following her eating plan approximately 45 to 50 % of the time. She states she is exercising 0 minutes 0 times per week. [name] [name] tries to make smarter food choices but still does not follow the plan and has a harder time planning her dinners. States she is frustrated because she is motivated to follow the meal plan after visiting with [place] but "cant do it" when she gets home. States she saw her Psychiatrist who added Naltrexone and an additional "psych medicine" Her weight is 195 lb (88.5 kg) today and has had a weight loss of 2 pounds over a period of 2 weeks since her last visit. She has lost 2 lbs since starting treatment with [place].  Vitamin D deficiency [name] [name] has a diagnosis of vitamin D deficiency. She is currently taking vit D and denies nausea, vomiting or muscle weakness.  Diabetes II [name] [name] has a diagnosis of diabetes type II. [name] [name] states fasting BGs range in the 180's and denies any hypoglycemic episodes. She has been working on intensive lifestyle modifications including diet, exercise, and weight loss to help control her blood glucose levels.  Hypertension [name] [name] is a 66 y.o. female with hypertension.  [name] [name] denies chest pain or shortness of breath on exertion. She is working weight loss to help control her blood pressure with the goal of decreasing her risk of heart attack and stroke. [name] [name] blood pressure is not currently controlled.  Major Depressive Disorder [name] [name] has a diagnosis of MDD and sees Psychiatrist Dr. Lynder Parents. States she has recently seen him and she has been prescribed Naltrexone and Vraylar for MDD. She is struggling with emotional eating and using food for comfort to the extent that it is negatively impacting her health. She often snacks when she is not hungry. [name] [name]  sometimes feels she is out of control and then feels guilty that she made poor food choices. She has been working on behavior modification techniques to help reduce her emotional eating and has been somewhat successful. She shows no sign of suicidal or homicidal ideations.  Depression screen [name] [name] 2/9 09/05/2016 08/22/2016 04/25/2015 12/15/2014  Decreased Interest 1 0 0 0  Down, Depressed, Hopeless 1 0 0 0  PHQ - 2 Score 2 0 0 0  Altered sleeping 1 0 - -  Tired, decreased energy 3 0 - -  Change in appetite 2 2 - -  Feeling bad or failure about yourself  3 0 - -  Trouble concentrating 3 0 - -  Moving slowly or fidgety/restless 1 0 - -  Suicidal thoughts 0 0 - -  PHQ-9 Score 15 2 - -        ALLERGIES: No Known Allergies  MEDICATIONS: Current Outpatient Prescriptions on File Prior to Visit  Medication Sig Dispense Refill  . aspirin 81 MG tablet Take 81 mg by mouth daily.      Marland Kitchen buPROPion (WELLBUTRIN XL) 150 MG 24 hr tablet Take 450 mg by mouth daily.      . Calcium Carbonate-Vitamin D (CALTRATE 600+D) 600-400 MG-UNIT per tablet Take 1 tablet by mouth 2 (two) times daily.    Marland Kitchen glucose blood (ONE TOUCH ULTRA TEST) test strip 1 each by Other route 3 (three) times daily. for testing 100 each 0  . hydrochlorothiazide (MICROZIDE)  12.5 MG capsule Take 1 capsule (12.5 mg total) by mouth daily. 30 capsule 3  . Insulin Pen Needle (BD PEN NEEDLE NANO U/F) 32G X 4 MM MISC USE ONCE DAILY WITH INSULIN INJECTION 100 each 0  . Lancets (ONETOUCH ULTRASOFT) lancets TEST BLOOD SUGAR ONCE DAILY 100 each 0  . liraglutide 18 MG/3ML SOPN Inject 0.3 mLs (1.8 mg total) into the skin daily. 3 pen 0  . metoprolol tartrate (LOPRESSOR) 50 MG tablet Take 1 tablet (50 mg total) by mouth 2 (two) times daily. 60 tablet 3  . Multiple Vitamins-Minerals (MULTIVITAMIN ADULTS 50+ PO) Take 1 tablet by mouth daily.    . naltrexone (DEPADE) 50 MG tablet Take 25 mg by mouth 2 (two) times daily.  0  .  olmesartan-hydrochlorothiazide (BENICAR HCT) 20-12.5 MG tablet Take 0.5 tablets by mouth daily. 45 tablet 1  . pantoprazole (PROTONIX) 40 MG tablet Take 1 tablet (40 mg total) by mouth daily. 30 tablet 5  . polyethylene glycol powder (GLYCOLAX/MIRALAX) powder Take 17 g by mouth daily. 3350 g 0  . Probiotic Product (PROBIOTIC DAILY PO) Take 1 capsule by mouth daily.    . rosuvastatin (CRESTOR) 20 MG tablet take 1 tablet by mouth once daily 30 tablet 5  . Vitamin D, Ergocalciferol, (DRISDOL) 50000 units CAPS capsule Take 1 capsule (50,000 Units total) by mouth every 7 (seven) days. 4 capsule 0   No current facility-administered medications on file prior to visit.     PAST MEDICAL HISTORY: Past Medical History:  Diagnosis Date  . Allergic rhinitis, cause unspecified   . ASCUS on Pap smear   . Back pain   . CAD (coronary artery disease)   . Constipation   . Depression   . Depressive disorder, not elsewhere classified   . Esophageal reflux   . Fatty liver 04/26/2015  . GERD (gastroesophageal reflux disease)   . Hyperlipidemia   . Hypertension   . Obstructive sleep apnea (adult) (pediatric)   . Personal history of goiter    childhood  . Type II or unspecified type diabetes mellitus without mention of complication, not stated as uncontrolled     PAST SURGICAL HISTORY: Past Surgical History:  Procedure Laterality Date  . BUNIONECTOMY    . colposcopy  2012   cervical biopsy (Dr. Benjie Karvonen)  . CORONARY STENT PLACEMENT  2004  . goiter removal  1968  . TUBAL LIGATION  1985    SOCIAL HISTORY: Social History  Substance Use Topics  . Smoking status: Former Smoker    Years: 46.00    Types: E-cigarettes    Quit date: 09/23/2012  . Smokeless tobacco: Never Used     Comment: Uses vapor cigarette  . Alcohol use 0.0 oz/week     Comment: occasional    FAMILY HISTORY: Family History  Problem Relation Age of Onset  . Cancer Mother        throat, cervical  . Diabetes Mother   . Stroke  Mother   . Heart attack Mother   . Heart disease Mother   . Hypertension Mother   . Thyroid disease Mother   . Alcoholism Mother   . Cancer Father        prostate  . Diabetes Sister   . Heart attack Sister   . Hypertension Sister   . Hyperlipidemia Sister   . Colon cancer Neg Hx     ROS: Review of Systems  Constitutional: Positive for weight loss.  Respiratory: Negative for shortness of breath.  Cardiovascular: Negative for chest pain.  Gastrointestinal: Negative for nausea and vomiting.  Musculoskeletal:       Negative muscle weakness  Endo/Heme/Allergies:       Negative hypoglycemia    PHYSICAL EXAM: Blood pressure (!) 152/85, pulse 63, temperature 98.2 F (36.8 C), height 5\' 3"  (1.6 m), weight 195 lb (88.5 kg), SpO2 96 %. Body mass index is 34.54 kg/m. Physical Exam  Constitutional: She is oriented to person, place, and time. She appears well-developed and well-nourished.  Cardiovascular: Normal rate.   Pulmonary/Chest: Effort normal.  Musculoskeletal: Normal range of motion.  Neurological: She is oriented to person, place, and time.  Skin: Skin is warm and dry.  Psychiatric: She has a normal mood and affect. Her behavior is normal.  Vitals reviewed.   RECENT LABS AND TESTS: BMET    Component Value Date/Time   NA 142 12/20/2016 0941   K 4.1 12/20/2016 0941   CL 101 12/20/2016 0941   CO2 24 12/20/2016 0941   GLUCOSE 115 (H) 12/20/2016 0941   GLUCOSE 159 (H) 08/22/2016 0906   BUN 15 12/20/2016 0941   CREATININE 0.74 12/20/2016 0941   CREATININE 0.70 12/15/2014 1949   CALCIUM 10.0 12/20/2016 0941   GFRNONAA 85 12/20/2016 0941   GFRNONAA 80 10/27/2013 0910   GFRAA 98 12/20/2016 0941   GFRAA >89 10/27/2013 0910   Lab Results  Component Value Date   HGBA1C 6.3 (H) 12/20/2016   HGBA1C 7.1 (H) 09/05/2016   HGBA1C 7.0 (H) 08/22/2016   HGBA1C 6.7 (H) 05/23/2016   HGBA1C 6.2 02/15/2016   Lab Results  Component Value Date   INSULIN 62.0 (H) 12/20/2016     INSULIN 66.8 (H) 09/05/2016   CBC    Component Value Date/Time   WBC 5.1 09/05/2016 1202   WBC 4.6 12/21/2014 0956   RBC 4.75 09/05/2016 1202   RBC 4.54 12/21/2014 0956   HGB 13.3 09/05/2016 1202   HCT 42.1 09/05/2016 1202   PLT 159.0 12/21/2014 0956   MCV 89 09/05/2016 1202   MCH 28.0 09/05/2016 1202   MCH 29.2 05/09/2011 0931   MCHC 31.6 09/05/2016 1202   MCHC 34.0 12/21/2014 0956   RDW 15.7 (H) 09/05/2016 1202   LYMPHSABS 1.7 09/05/2016 1202   MONOABS 0.2 12/21/2014 0956   EOSABS 0.1 09/05/2016 1202   BASOSABS 0.0 09/05/2016 1202   Iron/TIBC/Ferritin/ %Sat    Component Value Date/Time   FERRITIN 37.3 12/29/2014 0856   Lipid Panel     Component Value Date/Time   CHOL 132 09/05/2016 1202   TRIG 139 09/05/2016 1202   HDL 53 09/05/2016 1202   CHOLHDL 2 05/23/2016 0957   VLDL 24.8 05/23/2016 0957   LDLCALC 51 09/05/2016 1202   LDLDIRECT 77.7 12/27/2009 0942   Hepatic Function Panel     Component Value Date/Time   PROT 6.8 12/20/2016 0941   ALBUMIN 4.4 12/20/2016 0941   AST 28 12/20/2016 0941   ALT 44 (H) 12/20/2016 0941   ALKPHOS 71 12/20/2016 0941   BILITOT 0.4 12/20/2016 0941   BILIDIR 0.1 09/20/2016 0927   IBILI 0.5 10/27/2013 0910      Component Value Date/Time   TSH 0.624 09/05/2016 1202   TSH 0.59 12/21/2014 0956   TSH 0.68 05/13/2014 0927    ASSESSMENT AND PLAN: Type 2 diabetes mellitus without complication, without long-term current use of insulin ([name] [name]) - Plan: metFORMIN (GLUCOPHAGE XR) 750 MG 24 hr tablet  Vitamin D deficiency  Episode of recurrent major depressive disorder, unspecified  depression episode severity (HCC)  Class 1 obesity with serious comorbidity and body mass index (BMI) of 34.0 to 34.9 in adult, unspecified obesity type  PLAN:  Vitamin D Deficiency [name] [name] was informed that low vitamin D levels contributes to fatigue and are associated with obesity, breast, and colon cancer. She agrees to continue to take prescription Vit  D @50 ,000 IU every week and will follow up for routine testing of vitamin D, at least 2-3 times per year. She was informed of the risk of over-replacement of vitamin D and agrees to not increase her dose unless he discusses this with [place] first.  Diabetes II [name] [name] has been given extensive diabetes education by myself today including ideal fasting and post-prandial blood glucose readings, individual ideal Hgb A1c goals  and hypoglycemia prevention. We discussed the importance of good blood sugar control to decrease the likelihood of diabetic complications such as nephropathy, neuropathy, limb loss, blindness, coronary artery disease, and death. We discussed the importance of intensive lifestyle modification including diet, exercise and weight loss as the first line treatment for diabetes. [name] [name] agrees to increase Metformin to 750 mg bid #60 with no refills and will follow up at the agreed upon time.  Hypertension We discussed sodium restriction, working on healthy weight loss, and a regular exercise program as the means to achieve improved blood pressure control. [name] [name] agreed with this plan and agreed to follow up as directed. She is instructed to keep a record of BP and bring next visit We will continue to monitor her blood pressure as well as her progress with the above lifestyle modifications. She will continue her medications as prescribed and will watch for signs of hypotension as she continues her lifestyle modifications.  Major Depressive Disorder We discussed behavior modification techniques today to help [name] [name] deal with her emotional eating and depression. We will obtain records from Psychiatrist Dr. Lynder Parents for further review. She has agreed to continue to take her medicine as prescribed and agreed to follow up as directed.  Obesity [name] [name] is currently in the action stage of change. As such, her goal is to continue with weight loss efforts She has agreed to follow the Category 2 plan [name] [name]  has been instructed to work up to a goal of 150 minutes of combined cardio and strengthening exercise per week for weight loss and overall health benefits. We discussed the following Behavioral Modification Strategies today: increasing lean protein intake and planning for success  [name] [name] has agreed to follow up with our clinic in 3 weeks. She was informed of the importance of frequent follow up visits to maximize her success with intensive lifestyle modifications for her multiple health conditions.  I, Doreene Nest, am acting as transcriptionist for Lacy Duverney, PA-C  I have reviewed the above documentation for accuracy and completeness, and I agree with the above. -Lacy Duverney, PA-C  I have reviewed the above note and agree with the plan. -Dennard Nip, MD   OBESITY BEHAVIORAL INTERVENTION VISIT  Today's visit was # 12 out of 22.  Starting weight: 197 lbs Starting date: 09/05/16 Today's weight : 195 lbs  Today's date: 02/28/2017 Total lbs lost to date: 2 (Patients must lose 7 lbs in the first 6 months to continue with counseling)   ASK: We discussed the diagnosis of obesity with [name] [name] today and Adilynn agreed to give [place] permission to discuss obesity behavioral modification therapy today.  ASSESS: [name] [name] has the diagnosis of obesity and her BMI today is 34.55 [name] [name]  is in the action stage of change   ADVISE: [name] [name] was educated on the multiple health risks of obesity as well as the benefit of weight loss to improve her health. She was advised of the need for long term treatment and the importance of lifestyle modifications.  AGREE: Multiple dietary modification options and treatment options were discussed and  [name] [name] agreed to follow the Category 2 plan We discussed the following Behavioral Modification Strategies today: increasing lean protein intake and planning for success

## 2017-03-01 ENCOUNTER — Other Ambulatory Visit: Payer: Self-pay | Admitting: Family

## 2017-03-01 DIAGNOSIS — Z1231 Encounter for screening mammogram for malignant neoplasm of breast: Secondary | ICD-10-CM

## 2017-03-14 ENCOUNTER — Other Ambulatory Visit (INDEPENDENT_AMBULATORY_CARE_PROVIDER_SITE_OTHER): Payer: Self-pay | Admitting: Family Medicine

## 2017-03-14 DIAGNOSIS — E119 Type 2 diabetes mellitus without complications: Secondary | ICD-10-CM

## 2017-03-18 NOTE — Telephone Encounter (Signed)
completed

## 2017-03-19 ENCOUNTER — Ambulatory Visit (INDEPENDENT_AMBULATORY_CARE_PROVIDER_SITE_OTHER): Payer: 59 | Admitting: Physician Assistant

## 2017-03-19 VITALS — BP 132/81 | HR 61 | Temp 98.2°F | Ht 63.0 in | Wt 195.0 lb

## 2017-03-19 DIAGNOSIS — E119 Type 2 diabetes mellitus without complications: Secondary | ICD-10-CM | POA: Diagnosis not present

## 2017-03-19 DIAGNOSIS — Z9189 Other specified personal risk factors, not elsewhere classified: Secondary | ICD-10-CM

## 2017-03-19 DIAGNOSIS — Z6834 Body mass index (BMI) 34.0-34.9, adult: Secondary | ICD-10-CM

## 2017-03-19 DIAGNOSIS — E669 Obesity, unspecified: Secondary | ICD-10-CM | POA: Diagnosis not present

## 2017-03-19 DIAGNOSIS — I10 Essential (primary) hypertension: Secondary | ICD-10-CM

## 2017-03-19 MED ORDER — LIRAGLUTIDE 18 MG/3ML ~~LOC~~ SOPN: 1.8000 mg | PEN_INJECTOR | Freq: Every day | SUBCUTANEOUS | 0 refills | Status: DC

## 2017-03-20 ENCOUNTER — Ambulatory Visit
Admission: RE | Admit: 2017-03-20 | Discharge: 2017-03-20 | Disposition: A | Discharge status: Home / Self Care | Payer: 59 | Source: Ambulatory Visit | Attending: Family | Admitting: Family

## 2017-03-20 DIAGNOSIS — Z1231 Encounter for screening mammogram for malignant neoplasm of breast: Secondary | ICD-10-CM

## 2017-03-20 NOTE — Progress Notes (Signed)
Office: [phone]  /  Fax: [phone]   HPI:   Chief Complaint: OBESITY [name] [name] is here to discuss her progress with her obesity treatment plan. She is on the Category 2 plan and is following her eating plan approximately 40 % of the time. She states she is exercising 0 minutes 0 times per week. [name] [name] maintained her weight and has been making better food choices but continues to not follow the plan. States she wants to lose the weight but struggles to commit to the plan, especially at dinner, when she does not plan ahead her meals. Her weight is 195 lb (88.5 kg) today and has maintained weight over a period of 3 weeks since her last visit. She has lost 2 lbs since starting treatment with [place].  Diabetes II [name] [name] has a diagnosis of diabetes type II. [name] [name] continues to not bring her blood sugar logs in for review but states fasting BGs range in the 160's approximately and improved since 2 weeks ago. [name] [name] denies any hypoglycemic episodes. She has been working on intensive lifestyle modifications including diet, exercise, and weight loss to help control her blood glucose levels.  Hypertension [name] [name] is a 66 y.o. female with hypertension.  [name] [name] denies chest pain or shortness of breath on exertion. She is working weight loss to help control her blood pressure with the goal of decreasing her risk of heart attack and stroke. [name] [name] blood pressure is currently stable.  At risk for cardiovascular disease [name] [name] is at a higher than average risk for cardiovascular disease due to obesity and diabetes. She currently denies any chest pain.   ALLERGIES: No Known Allergies  MEDICATIONS: Current Outpatient Prescriptions on File Prior to Visit  Medication Sig Dispense Refill  . aspirin 81 MG tablet Take 81 mg by mouth daily.      [name] [name] buPROPion (WELLBUTRIN XL) 150 MG 24 hr tablet Take 450 mg by mouth daily.      . Calcium Carbonate-Vitamin D (CALTRATE 600+D) 600-400 MG-UNIT per tablet Take 1  tablet by mouth 2 (two) times daily.    [name] [name] glucose blood (ONE TOUCH ULTRA TEST) test strip 1 each by Other route 3 (three) times daily. for testing 100 each 0  . hydrochlorothiazide (MICROZIDE) 12.5 MG capsule Take 1 capsule (12.5 mg total) by mouth daily. 30 capsule 3  . Insulin Pen Needle (BD PEN NEEDLE NANO U/F) 32G X 4 MM MISC USE ONCE DAILY WITH INSULIN INJECTION 100 each 0  . Lancets (ONETOUCH ULTRASOFT) lancets TEST BLOOD SUGAR ONCE DAILY 100 each 0  . metFORMIN (GLUCOPHAGE XR) 750 MG 24 hr tablet Take 1 tablet (750 mg total) by mouth 2 (two) times daily. 60 tablet 0  . metoprolol tartrate (LOPRESSOR) 50 MG tablet Take 1 tablet (50 mg total) by mouth 2 (two) times daily. 60 tablet 3  . Multiple Vitamins-Minerals (MULTIVITAMIN ADULTS 50+ PO) Take 1 tablet by mouth daily.    . naltrexone (DEPADE) 50 MG tablet Take 25 mg by mouth 2 (two) times daily.  0  . olmesartan-hydrochlorothiazide (BENICAR HCT) 20-12.5 MG tablet Take 0.5 tablets by mouth daily. 45 tablet 1  . pantoprazole (PROTONIX) 40 MG tablet Take 1 tablet (40 mg total) by mouth daily. 30 tablet 5  . polyethylene glycol powder (GLYCOLAX/MIRALAX) powder Take 17 g by mouth daily. 3350 g 0  . Probiotic Product (PROBIOTIC DAILY PO) Take 1 capsule by mouth daily.    . rosuvastatin (CRESTOR) 20 MG tablet take 1 tablet by  mouth once daily 30 tablet 5  . Vitamin D, Ergocalciferol, (DRISDOL) 50000 units CAPS capsule Take 1 capsule (50,000 Units total) by mouth every 7 (seven) days. 4 capsule 0   No current facility-administered medications on file prior to visit.     PAST MEDICAL HISTORY: Past Medical History:  Diagnosis Date  . Allergic rhinitis, cause unspecified   . ASCUS on Pap smear   . Back pain   . CAD (coronary artery disease)   . Constipation   . Depression   . Depressive disorder, not elsewhere classified   . Esophageal reflux   . Fatty liver 04/26/2015  . GERD (gastroesophageal reflux disease)   . Hyperlipidemia   .  Hypertension   . Obstructive sleep apnea (adult) (pediatric)   . Personal history of goiter    childhood  . Type II or unspecified type diabetes mellitus without mention of complication, not stated as uncontrolled     PAST SURGICAL HISTORY: Past Surgical History:  Procedure Laterality Date  . BUNIONECTOMY    . colposcopy  2012   cervical biopsy (Dr. Benjie Karvonen)  . CORONARY STENT PLACEMENT  2004  . goiter removal  1968  . TUBAL LIGATION  1985    SOCIAL HISTORY: Social History  Substance Use Topics  . Smoking status: Former Smoker    Years: 46.00    Types: E-cigarettes    Quit date: 09/23/2012  . Smokeless tobacco: Never Used     Comment: Uses vapor cigarette  . Alcohol use 0.0 oz/week     Comment: occasional    FAMILY HISTORY: Family History  Problem Relation Age of Onset  . Cancer Mother        throat, cervical  . Diabetes Mother   . Stroke Mother   . Heart attack Mother   . Heart disease Mother   . Hypertension Mother   . Thyroid disease Mother   . Alcoholism Mother   . Cancer Father        prostate  . Diabetes Sister   . Heart attack Sister   . Hypertension Sister   . Hyperlipidemia Sister   . Colon cancer Neg Hx   . Breast cancer Neg Hx     ROS: Review of Systems  Constitutional: Negative for weight loss.  Respiratory: Negative for shortness of breath (on exertion).   Cardiovascular: Negative for chest pain.    PHYSICAL EXAM: Blood pressure 132/81, pulse 61, temperature 98.2 F (36.8 C), temperature source Oral, height 5\' 3"  (1.6 m), weight 195 lb (88.5 kg), SpO2 96 %. Body mass index is 34.54 kg/m. Physical Exam  Constitutional: She is oriented to person, place, and time. She appears well-developed and well-nourished.  Cardiovascular: Normal rate.   Pulmonary/Chest: Effort normal.  Musculoskeletal: Normal range of motion.  Neurological: She is oriented to person, place, and time.  Skin: Skin is warm and dry.  Psychiatric: She has a normal mood and  affect. Her behavior is normal.  Vitals reviewed.   RECENT LABS AND TESTS: BMET    Component Value Date/Time   NA 142 12/20/2016 0941   K 4.1 12/20/2016 0941   CL 101 12/20/2016 0941   CO2 24 12/20/2016 0941   GLUCOSE 115 (H) 12/20/2016 0941   GLUCOSE 159 (H) 08/22/2016 0906   BUN 15 12/20/2016 0941   CREATININE 0.74 12/20/2016 0941   CREATININE 0.70 12/15/2014 1949   CALCIUM 10.0 12/20/2016 0941   GFRNONAA 85 12/20/2016 0941   GFRNONAA 80 10/27/2013 0910   GFRAA 98  12/20/2016 0941   GFRAA >89 10/27/2013 0910   Lab Results  Component Value Date   HGBA1C 6.3 (H) 12/20/2016   HGBA1C 7.1 (H) 09/05/2016   HGBA1C 7.0 (H) 08/22/2016   HGBA1C 6.7 (H) 05/23/2016   HGBA1C 6.2 02/15/2016   Lab Results  Component Value Date   INSULIN 62.0 (H) 12/20/2016   INSULIN 66.8 (H) 09/05/2016   CBC    Component Value Date/Time   WBC 5.1 09/05/2016 1202   WBC 4.6 12/21/2014 0956   RBC 4.75 09/05/2016 1202   RBC 4.54 12/21/2014 0956   HGB 13.3 09/05/2016 1202   HCT 42.1 09/05/2016 1202   PLT 159.0 12/21/2014 0956   MCV 89 09/05/2016 1202   MCH 28.0 09/05/2016 1202   MCH 29.2 05/09/2011 0931   MCHC 31.6 09/05/2016 1202   MCHC 34.0 12/21/2014 0956   RDW 15.7 (H) 09/05/2016 1202   LYMPHSABS 1.7 09/05/2016 1202   MONOABS 0.2 12/21/2014 0956   EOSABS 0.1 09/05/2016 1202   BASOSABS 0.0 09/05/2016 1202   Iron/TIBC/Ferritin/ %Sat    Component Value Date/Time   FERRITIN 37.3 12/29/2014 0856   Lipid Panel     Component Value Date/Time   CHOL 132 09/05/2016 1202   TRIG 139 09/05/2016 1202   HDL 53 09/05/2016 1202   CHOLHDL 2 05/23/2016 0957   VLDL 24.8 05/23/2016 0957   LDLCALC 51 09/05/2016 1202   LDLDIRECT 77.7 12/27/2009 0942   Hepatic Function Panel     Component Value Date/Time   PROT 6.8 12/20/2016 0941   ALBUMIN 4.4 12/20/2016 0941   AST 28 12/20/2016 0941   ALT 44 (H) 12/20/2016 0941   ALKPHOS 71 12/20/2016 0941   BILITOT 0.4 12/20/2016 0941   BILIDIR 0.1  09/20/2016 0927   IBILI 0.5 10/27/2013 0910      Component Value Date/Time   TSH 0.624 09/05/2016 1202   TSH 0.59 12/21/2014 0956   TSH 0.68 05/13/2014 0927    ASSESSMENT AND PLAN: Type 2 diabetes mellitus without complication, without long-term current use of insulin ([name] [name]) - Plan: liraglutide 18 MG/3ML SOPN  Essential hypertension  At risk for heart disease  Class 1 obesity with serious comorbidity and body mass index (BMI) of 34.0 to 34.9 in adult, unspecified obesity type  PLAN:  Diabetes II [name] [name] has been given extensive diabetes education by myself today including ideal fasting and post-prandial blood glucose readings, individual ideal Hgb A1c goals  and hypoglycemia prevention. We discussed the importance of good blood sugar control to decrease the likelihood of diabetic complications such as nephropathy, neuropathy, limb loss, blindness, coronary artery disease, and death. We discussed the importance of intensive lifestyle modification including diet, exercise and weight loss as the first line treatment for diabetes. [name] [name] agrees to continue [name] [name] at 1.8 mg, we will refill (3 pen) and she will follow up at the agreed upon time.  Hypertension We discussed sodium restriction, working on healthy weight loss, and a regular exercise program as the means to achieve improved blood pressure control. [name] [name] agreed with this plan and agreed to follow up as directed. We will continue to monitor her blood pressure as well as her progress with the above lifestyle modifications. She will continue her medications as prescribed and will watch for signs of hypotension as she continues her lifestyle modifications.  Cardiovascular risk counseling [name] [name] was given extended (15 minutes) coronary artery disease prevention counseling today. She is 66 y.o. female and has risk factors for heart disease including obesity and diabetes.  We discussed intensive lifestyle modifications today with an emphasis on  specific weight loss instructions and strategies. Pt was also informed of the importance of increasing exercise and decreasing saturated fats to help prevent heart disease.  Obesity [name] [name] is currently in the action stage of change. As such, her goal is to continue with weight loss efforts She has agreed to follow the Category 2 plan [name] [name] has been instructed to work up to a goal of 150 minutes of combined cardio and strengthening exercise per week for weight loss and overall health benefits. We discussed the following Behavioral Modification Strategies today: increasing lean protein intake and work on meal planning and easy cooking plans  [name] [name] has agreed to follow up with our clinic in 3 weeks. She was informed of the importance of frequent follow up visits to maximize her success with intensive lifestyle modifications for her multiple health conditions.  I, [name] [name], am acting as transcriptionist for [name] Duverney, PA-C  I have reviewed the above documentation for accuracy and completeness, and I agree with the above. -[name] Duverney, PA-C  I have reviewed the above note and agree with the plan. -[name] Nip, MD  OBESITY BEHAVIORAL INTERVENTION VISIT  Today's visit was # 13 out of 22.  Starting weight: 197 lbs Starting date: 09/05/16 Today's weight : 195 lbs Today's date: 03/19/2017 Total lbs lost to date: 2 (Patients must lose 7 lbs in the first 6 months to continue with counseling)   ASK: We discussed the diagnosis of obesity with [name] [name] today and Suriyah agreed to give [place] permission to discuss obesity behavioral modification therapy today.  ASSESS: Dione has the diagnosis of obesity and her BMI today is 34.55 Aamilah is in the action stage of change   ADVISE: Tandrea was educated on the multiple health risks of obesity as well as the benefit of weight loss to improve her health. She was advised of the need for long term treatment and the importance of lifestyle  modifications.  AGREE: Multiple dietary modification options and treatment options were discussed and  Humna agreed to follow the Category 2 plan We discussed the following Behavioral Modification Strategies today: increasing lean protein intake and work on meal planning and easy cooking plans

## 2017-03-27 ENCOUNTER — Encounter: Payer: Self-pay | Admitting: Family

## 2017-03-27 ENCOUNTER — Ambulatory Visit (INDEPENDENT_AMBULATORY_CARE_PROVIDER_SITE_OTHER): Payer: 59 | Admitting: Family

## 2017-03-27 VITALS — BP 162/72 | HR 66 | Temp 98.4°F | Resp 18 | Ht 63.0 in | Wt 196.8 lb

## 2017-03-27 DIAGNOSIS — I10 Essential (primary) hypertension: Secondary | ICD-10-CM | POA: Diagnosis not present

## 2017-03-27 DIAGNOSIS — Z23 Encounter for immunization: Secondary | ICD-10-CM | POA: Diagnosis not present

## 2017-03-27 DIAGNOSIS — E1121 Type 2 diabetes mellitus with diabetic nephropathy: Secondary | ICD-10-CM | POA: Diagnosis not present

## 2017-03-27 LAB — BASIC METABOLIC PANEL
BUN: 19 mg/dL (ref 6–23)
CO2: 27 mEq/L (ref 19–32)
Calcium: 8.9 mg/dL (ref 8.4–10.5)
Chloride: 107 mEq/L (ref 96–112)
Creatinine, Ser: 0.68 mg/dL (ref 0.40–1.20)
GFR: 91.68 mL/min (ref >60.00)
Glucose, Bld: 124 mg/dL — ABNORMAL HIGH (ref 70–99)
Potassium: 3.9 mEq/L (ref 3.5–5.1)
Sodium: 141 mEq/L (ref 135–145)

## 2017-03-27 LAB — HEMOGLOBIN A1C: Hgb A1c MFr Bld: 6.1 % (ref 4.6–6.5)

## 2017-03-27 MED ORDER — HYDROCHLOROTHIAZIDE 12.5 MG PO CAPS: 12.5000 mg | ORAL_CAPSULE | Freq: Every day | ORAL | 5 refills | Status: DC

## 2017-03-27 MED ORDER — METOPROLOL TARTRATE 50 MG PO TABS: 50.0000 mg | ORAL_TABLET | Freq: Two times a day (BID) | ORAL | 5 refills | Status: DC

## 2017-03-27 MED ORDER — PANTOPRAZOLE SODIUM 40 MG PO TBEC
40.0000 mg | DELAYED_RELEASE_TABLET | Freq: Every day | ORAL | 5 refills | Status: DC
Start: 2017-03-27 — End: 2017-10-21

## 2017-03-27 NOTE — Patient Instructions (Addendum)
Restart your blood pressure medications. Complete lab work prior to leaving.

## 2017-03-27 NOTE — Progress Notes (Signed)
Subjective:    Patient ID: [name] [name], female    DOB: 30-Aug-1950, 66 y.o.   MRN: [id number]  HPI   [name] [name] is a 66 yr old female who presents today for follow up.  1) HTN- maintained on metoprolol, hctz, benicar hct. Has not taken BP meds in 3 days. "got busy and forgot."   BP Readings from Last 3 Encounters:  03/27/17 (!) 162/72  03/19/17 132/81  02/27/17 (!) 152/85   2) DM2- Reports recent sugars have been stable at home.  Lab Results  Component Value Date   HGBA1C 6.3 (H) 12/20/2016   HGBA1C 7.1 (H) 09/05/2016   HGBA1C 7.0 (H) 08/22/2016   Lab Results  Component Value Date   MICROALBUR 1.9 05/23/2016   LDLCALC 51 09/05/2016   CREATININE 0.74 12/20/2016       Review of Systems See HPI  Past Medical History:  Diagnosis Date  . Allergic rhinitis, cause unspecified   . ASCUS on Pap smear   . Back pain   . CAD (coronary artery disease)   . Constipation   . Depression   . Depressive disorder, not elsewhere classified   . Esophageal reflux   . Fatty liver 04/26/2015  . GERD (gastroesophageal reflux disease)   . Hyperlipidemia   . Hypertension   . Obstructive sleep apnea (adult) (pediatric)   . Personal history of goiter    childhood  . Type II or unspecified type diabetes mellitus without mention of complication, not stated as uncontrolled      Social History   Social History  . Marital status: Married    Spouse name: N/A  . Number of children: 3  . Years of education: N/A   Occupational History  . 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  [employer]   Social History Main Topics  . Smoking status: Former Smoker    Years: 46.00    Types: E-cigarettes    Quit date: 09/23/2012  . Smokeless tobacco: Never Used     Comment: Uses vapor cigarette  . Alcohol use 0.0 oz/week     Comment: occasional  . Drug use: No  . Sexual activity: Not on file   Other Topics Concern  . Not on file   Social History Narrative   Caffeine  Use:  2 cups coffee daily   Regular exercise:  No          Past Surgical History:  Procedure Laterality Date  . BUNIONECTOMY    . colposcopy  2012   cervical biopsy (Dr. Benjie Karvonen)  . CORONARY STENT PLACEMENT  2004  . goiter removal  1968  . TUBAL LIGATION  1985    Family History  Problem Relation Age of Onset  . Cancer Mother        throat, cervical  . Diabetes Mother   . Stroke Mother   . Heart attack Mother   . Heart disease Mother   . Hypertension Mother   . Thyroid disease Mother   . Alcoholism Mother   . Cancer Father        prostate  . Diabetes Sister   . Heart attack Sister   . Hypertension Sister   . Hyperlipidemia Sister   . Colon cancer Neg Hx   . Breast cancer Neg Hx     No Known Allergies  Current Outpatient Prescriptions on File Prior to Visit  Medication Sig Dispense Refill  . aspirin 81 MG tablet Take  81 mg by mouth daily.      Marland Kitchen buPROPion (WELLBUTRIN XL) 150 MG 24 hr tablet Take 450 mg by mouth daily.      . Calcium Carbonate-Vitamin D (CALTRATE 600+D) 600-400 MG-UNIT per tablet Take 1 tablet by mouth 2 (two) times daily.    Marland Kitchen glucose blood (ONE TOUCH ULTRA TEST) test strip 1 each by Other route 3 (three) times daily. for testing 100 each 0  . Insulin Pen Needle (BD PEN NEEDLE NANO U/F) 32G X 4 MM MISC USE ONCE DAILY WITH INSULIN INJECTION 100 each 0  . Lancets (ONETOUCH ULTRASOFT) lancets TEST BLOOD SUGAR ONCE DAILY 100 each 0  . liraglutide 18 MG/3ML SOPN Inject 0.3 mLs (1.8 mg total) into the skin daily. 3 pen 0  . metFORMIN (GLUCOPHAGE XR) 750 MG 24 hr tablet Take 1 tablet (750 mg total) by mouth 2 (two) times daily. 60 tablet 0  . Multiple Vitamins-Minerals (MULTIVITAMIN ADULTS 50+ PO) Take 1 tablet by mouth daily.    . naltrexone (DEPADE) 50 MG tablet Take 25 mg by mouth 2 (two) times daily.  0  . olmesartan-hydrochlorothiazide (BENICAR HCT) 20-12.5 MG tablet Take 0.5 tablets by mouth daily. 45 tablet 1  . polyethylene glycol powder  (GLYCOLAX/MIRALAX) powder Take 17 g by mouth daily. 3350 g 0  . Probiotic Product (PROBIOTIC DAILY PO) Take 1 capsule by mouth daily.    . rosuvastatin (CRESTOR) 20 MG tablet take 1 tablet by mouth once daily 30 tablet 5  . Vitamin D, Ergocalciferol, (DRISDOL) 50000 units CAPS capsule Take 1 capsule (50,000 Units total) by mouth every 7 (seven) days. 4 capsule 0   No current facility-administered medications on file prior to visit.     BP (!) 162/72   Pulse 66   Temp 98.4 F (36.9 C) (Oral)   Resp 18   Ht 5\' 3"  (1.6 m)   Wt 196 lb 12.8 oz (89.3 kg)   SpO2 99%   BMI 34.86 kg/m       Objective:   Physical Exam  Constitutional: She is oriented to person, place, and time. She appears well-developed and well-nourished.  HENT:  Head: Normocephalic and atraumatic.  Cardiovascular: Normal rate, regular rhythm and normal heart sounds.   No murmur heard. Pulmonary/Chest: Effort normal and breath sounds normal. No respiratory distress. She has no wheezes.  Musculoskeletal: She exhibits no edema.  Neurological: She is alert and oriented to person, place, and time.  Psychiatric: She has a normal mood and affect. Her behavior is normal. Judgment and thought content normal.          Assessment & Plan:  DM2- controlled. Continue metformin.obtain follow up A1C, BMET.   HTN- uncontrolled. Resume meds. Reinforced importance of compliance

## 2017-04-04 ENCOUNTER — Telehealth: Payer: Self-pay | Admitting: Family

## 2017-04-04 NOTE — Telephone Encounter (Signed)
Relation to OM:BTDH Call back number:[phone]   Reason for call:  Dr. Sharee [facility] faxing over surgical clearance form to clear patient for bunion surgery, patient scheduled for 04/05/2017 at 11:30am with NP.

## 2017-04-04 NOTE — Telephone Encounter (Signed)
Not Received; forwarded to provider/SLS 10/25

## 2017-04-05 ENCOUNTER — Ambulatory Visit (HOSPITAL_BASED_OUTPATIENT_CLINIC_OR_DEPARTMENT_OTHER)
Admission: RE | Admit: 2017-04-05 | Discharge: 2017-04-05 | Disposition: A | Discharge status: Home / Self Care | Payer: 59 | Source: Ambulatory Visit | Attending: Family | Admitting: Family

## 2017-04-05 ENCOUNTER — Ambulatory Visit (INDEPENDENT_AMBULATORY_CARE_PROVIDER_SITE_OTHER): Payer: 59 | Admitting: Family

## 2017-04-05 VITALS — BP 160/90 | HR 60 | Temp 98.2°F | Ht 63.0 in | Wt 196.6 lb

## 2017-04-05 DIAGNOSIS — I10 Essential (primary) hypertension: Secondary | ICD-10-CM | POA: Diagnosis not present

## 2017-04-05 DIAGNOSIS — E785 Hyperlipidemia, unspecified: Secondary | ICD-10-CM | POA: Insufficient documentation

## 2017-04-05 DIAGNOSIS — I2510 Atherosclerotic heart disease of native coronary artery without angina pectoris: Secondary | ICD-10-CM | POA: Insufficient documentation

## 2017-04-05 DIAGNOSIS — Z01818 Encounter for other preprocedural examination: Secondary | ICD-10-CM | POA: Insufficient documentation

## 2017-04-05 DIAGNOSIS — Z419 Encounter for procedure for purposes other than remedying health state, unspecified: Secondary | ICD-10-CM | POA: Diagnosis not present

## 2017-04-05 DIAGNOSIS — K219 Gastro-esophageal reflux disease without esophagitis: Secondary | ICD-10-CM | POA: Insufficient documentation

## 2017-04-05 DIAGNOSIS — F329 Major depressive disorder, single episode, unspecified: Secondary | ICD-10-CM | POA: Insufficient documentation

## 2017-04-05 DIAGNOSIS — G4733 Obstructive sleep apnea (adult) (pediatric): Secondary | ICD-10-CM | POA: Insufficient documentation

## 2017-04-05 DIAGNOSIS — K5900 Constipation, unspecified: Secondary | ICD-10-CM | POA: Diagnosis not present

## 2017-04-05 LAB — CBC WITH DIFFERENTIAL/PLATELET
Basophils Absolute: 0 10*3/uL (ref 0.0–0.1)
Basophils Relative: 0.5 % (ref 0.0–3.0)
Eosinophils Absolute: 0.1 10*3/uL (ref 0.0–0.7)
Eosinophils Relative: 2.2 % (ref 0.0–5.0)
HCT: 39.4 % (ref 36.0–46.0)
Hemoglobin: 13.1 g/dL (ref 12.0–15.0)
Lymphocytes Relative: 34.1 % (ref 12.0–46.0)
Lymphs Abs: 1.8 10*3/uL (ref 0.7–4.0)
MCHC: 33.1 g/dL (ref 30.0–36.0)
MCV: 87.9 fl (ref 78.0–100.0)
Monocytes Absolute: 0.4 10*3/uL (ref 0.1–1.0)
Monocytes Relative: 6.7 % (ref 3.0–12.0)
Neutro Abs: 3 10*3/uL (ref 1.4–7.7)
Neutrophils Relative %: 56.5 % (ref 43.0–77.0)
Platelets: 173 10*3/uL (ref 150.0–400.0)
RBC: 4.49 Mil/uL (ref 3.87–5.11)
RDW: 16 % — ABNORMAL HIGH (ref 11.5–15.5)
WBC: 5.3 10*3/uL (ref 4.0–10.5)

## 2017-04-05 LAB — BASIC METABOLIC PANEL
BUN: 17 mg/dL (ref 6–23)
CO2: 32 mEq/L (ref 19–32)
Calcium: 9.8 mg/dL (ref 8.4–10.5)
Chloride: 105 mEq/L (ref 96–112)
Creatinine, Ser: 0.72 mg/dL (ref 0.40–1.20)
GFR: 85.82 mL/min (ref >60.00)
Glucose, Bld: 87 mg/dL (ref 70–99)
Potassium: 3.9 mEq/L (ref 3.5–5.1)
Sodium: 142 mEq/L (ref 135–145)

## 2017-04-05 LAB — URINALYSIS, ROUTINE W REFLEX MICROSCOPIC
Bilirubin Urine: NEGATIVE
Hgb urine dipstick: NEGATIVE
Ketones, ur: NEGATIVE
Leukocytes, UA: NEGATIVE
Nitrite: NEGATIVE
RBC / HPF: NONE SEEN (ref 0–?)
Specific Gravity, Urine: 1.02 (ref 1.000–1.030)
Total Protein, Urine: NEGATIVE
Urine Glucose: NEGATIVE
Urobilinogen, UA: 0.2 (ref 0.0–1.0)
pH: 7.5 (ref 5.0–8.0)

## 2017-04-05 LAB — HEPATIC FUNCTION PANEL
ALT: 34 U/L (ref 0–35)
AST: 20 U/L (ref 0–37)
Albumin: 4.1 g/dL (ref 3.5–5.2)
Alkaline Phosphatase: 52 U/L (ref 39–117)
Bilirubin, Direct: 0.2 mg/dL (ref 0.0–0.3)
Total Bilirubin: 0.7 mg/dL (ref 0.2–1.2)
Total Protein: 6.5 g/dL (ref 6.0–8.3)

## 2017-04-05 MED ORDER — OLMESARTAN MEDOXOMIL-HCTZ 20-12.5 MG PO TABS: 1.0000 | ORAL_TABLET | Freq: Every day | ORAL | 1 refills | Status: DC

## 2017-04-05 NOTE — Progress Notes (Signed)
Subjective:    Patient ID: [name] [name], female    DOB: [birth year], 66 y.o.   MRN: [id number]  HPI   Patient is a 66 year old female who presents today for preoperative evaluation.  She is scheduled for right-sided foot surgery with podiatry. Surgery is scheduled for 04/15/17.   Right bunion- has been present for years. S/p bunionectomy on the left.  Bunion causes pain if she does not wear her tennis shoes.    HTN- reports that she forgot her medication yesterday but she did take it today.   BP Readings from Last 3 Encounters:  04/05/17 (!) 159/71  03/27/17 (!) 162/72  03/19/17 132/81     Review of Systems  Constitutional: Negative for unexpected weight change.  HENT: Negative for rhinorrhea.   Respiratory: Negative for cough and shortness of breath.   Cardiovascular: Negative for chest pain.  Gastrointestinal:       Reports that she alternating between constipation/diarrhea.   Genitourinary: Negative for dysuria and frequency.  Musculoskeletal: Negative for arthralgias.  Skin: Negative for rash.  Neurological: Negative for headaches.  Hematological: Negative for adenopathy.  Psychiatric/Behavioral:       Denies depression/anxiety   Past Medical History:  Diagnosis Date  . Allergic rhinitis, cause unspecified   . ASCUS on Pap smear   . Back pain   . CAD (coronary artery disease)   . Constipation   . Depression   . Depressive disorder, not elsewhere classified   . Esophageal reflux   . Fatty liver 04/26/2015  . GERD (gastroesophageal reflux disease)   . Hyperlipidemia   . Hypertension   . Obstructive sleep apnea (adult) (pediatric)   . Personal history of goiter    childhood  . Type II or unspecified type diabetes mellitus without mention of complication, not stated as uncontrolled      Social History   Social History  . Marital status: Married    Spouse name: N/A  . Number of children: 3  . Years of education: N/A   Occupational History  . branch  Dance movement psychotherapist  .  [employer]   Social History Main Topics  . Smoking status: Former Smoker    Years: 46.00    Types: E-cigarettes    Quit date: 09/23/2012  . Smokeless tobacco: Never Used     Comment: Uses vapor cigarette  . Alcohol use 0.0 oz/week     Comment: occasional  . Drug use: No  . Sexual activity: Not on file   Other Topics Concern  . Not on file   Social History Narrative   Caffeine Use:  2 cups coffee daily   Regular exercise:  No          Past Surgical History:  Procedure Laterality Date  . BUNIONECTOMY    . colposcopy  2012   cervical biopsy (Dr. Benjie Karvonen)  . CORONARY STENT PLACEMENT  2004  . goiter removal  1968  . TUBAL LIGATION  1985    Family History  Problem Relation Age of Onset  . Cancer Mother        throat, cervical  . Diabetes Mother   . Stroke Mother   . Heart attack Mother   . Heart disease Mother   . Hypertension Mother   . Thyroid disease Mother   . Alcoholism Mother   . Cancer Father        prostate  . Diabetes Sister   . Heart attack Sister   .  Hypertension Sister   . Hyperlipidemia Sister   . Colon cancer Neg Hx   . Breast cancer Neg Hx     No Known Allergies  Current Outpatient Prescriptions on File Prior to Visit  Medication Sig Dispense Refill  . aspirin 81 MG tablet Take 81 mg by mouth daily.      Marland Kitchen buPROPion (WELLBUTRIN XL) 150 MG 24 hr tablet Take 450 mg by mouth daily.      . Calcium Carbonate-Vitamin D (CALTRATE 600+D) 600-400 MG-UNIT per tablet Take 1 tablet by mouth 2 (two) times daily.    Marland Kitchen glucose blood (ONE TOUCH ULTRA TEST) test strip 1 each by Other route 3 (three) times daily. for testing 100 each 0  . hydrochlorothiazide (MICROZIDE) 12.5 MG capsule Take 1 capsule (12.5 mg total) by mouth daily. 30 capsule 5  . Insulin Pen Needle (BD PEN NEEDLE NANO U/F) 32G X 4 MM MISC USE ONCE DAILY WITH INSULIN INJECTION 100 each 0  . Lancets (ONETOUCH ULTRASOFT) lancets TEST BLOOD  SUGAR ONCE DAILY 100 each 0  . liraglutide 18 MG/3ML SOPN Inject 0.3 mLs (1.8 mg total) into the skin daily. 3 pen 0  . metFORMIN (GLUCOPHAGE XR) 750 MG 24 hr tablet Take 1 tablet (750 mg total) by mouth 2 (two) times daily. 60 tablet 0  . metoprolol tartrate (LOPRESSOR) 50 MG tablet Take 1 tablet (50 mg total) by mouth 2 (two) times daily. 60 tablet 5  . Multiple Vitamins-Minerals (MULTIVITAMIN ADULTS 50+ PO) Take 1 tablet by mouth daily.    . naltrexone (DEPADE) 50 MG tablet Take 25 mg by mouth 2 (two) times daily.  0  . olmesartan-hydrochlorothiazide (BENICAR HCT) 20-12.5 MG tablet Take 0.5 tablets by mouth daily. 45 tablet 1  . pantoprazole (PROTONIX) 40 MG tablet Take 1 tablet (40 mg total) by mouth daily. 30 tablet 5  . polyethylene glycol powder (GLYCOLAX/MIRALAX) powder Take 17 g by mouth daily. 3350 g 0  . Probiotic Product (PROBIOTIC DAILY PO) Take 1 capsule by mouth daily.    . rosuvastatin (CRESTOR) 20 MG tablet take 1 tablet by mouth once daily 30 tablet 5  . Vitamin D, Ergocalciferol, (DRISDOL) 50000 units CAPS capsule Take 1 capsule (50,000 Units total) by mouth every 7 (seven) days. 4 capsule 0   No current facility-administered medications on file prior to visit.     BP (!) 159/71   Pulse 60   Temp 98.2 F (36.8 C) (Oral)   Ht 5\' 3"  (1.6 m)   Wt 196 lb 9.6 oz (89.2 kg)   SpO2 100%   BMI 34.83 kg/m       Objective:   Physical Exam  Constitutional: She is oriented to person, place, and time. She appears well-developed and well-nourished.  HENT:  Head: Normocephalic and atraumatic.  Right Ear: Tympanic membrane and ear canal normal.  Left Ear: Tympanic membrane and ear canal normal.  Mouth/Throat: No oropharyngeal exudate, posterior oropharyngeal edema or posterior oropharyngeal erythema.  Cardiovascular: Normal rate, regular rhythm and normal heart sounds.   No murmur heard. Pulmonary/Chest: Effort normal and breath sounds normal. No respiratory distress. She has  no wheezes.  Neurological: She is alert and oriented to person, place, and time.  Skin: Skin is warm and dry.  Psychiatric: She has a normal mood and affect. Her behavior is normal. Judgment and thought content normal.          Assessment & Plan:  Preoperative evaluation.  EKG tracing is personally reviewed.  EKG  notes NSR.  No acute changes. Check cmet, pt/inr, ua/culture, cxr.    HTN- uncontrolled. Advised pt as follows:   Increase benicar hct to a full tablet once daily. Stop the hctz 12.5.  Constipation- advised pt-Add metamucil supplement once daily.  Drink plenty of water.Try adding colace one cap twice daily for constipation.

## 2017-04-05 NOTE — Patient Instructions (Addendum)
Add metamucil supplement once daily.  Drink plenty of water. Try adding colace one cap twice daily for constipation.  Increase benicar hct to a full tablet once daily. Stop the hctz 12.5.

## 2017-04-05 NOTE — Progress Notes (Signed)
Pre visit review using our clinic tool,if applicable. No additional management support is needed unless otherwise documented below in the visit note.  

## 2017-04-05 NOTE — Telephone Encounter (Signed)
Received Surgical Clearance paperwork and spoke to PCP about this matter; last OV note states that patient reported she was having Foot Surgery on 04/15/17, Melissa informed the patient that she would need "a special type of appointment, where more things would be needed [i.e. EKG w/i 30 days]. Spoke with patient and she is still on schedule to have Sx on 04/15/17; she has appointment with Melissa at 11:30am today/SLS 10/26

## 2017-04-05 NOTE — Telephone Encounter (Signed)
Paient in today for Surgical Clearance appointment.

## 2017-04-06 LAB — URINE CULTURE
MICRO NUMBER:: 81202693
SPECIMEN QUALITY:: ADEQUATE

## 2017-04-06 LAB — PROTIME-INR
INR: 1
Prothrombin Time: 10.3 s (ref 9.0–11.5)

## 2017-04-09 ENCOUNTER — Encounter (INDEPENDENT_AMBULATORY_CARE_PROVIDER_SITE_OTHER): Payer: Self-pay

## 2017-04-09 ENCOUNTER — Ambulatory Visit (INDEPENDENT_AMBULATORY_CARE_PROVIDER_SITE_OTHER): Payer: 59 | Admitting: Physician Assistant

## 2017-04-10 ENCOUNTER — Ambulatory Visit: Payer: 59 | Admitting: Family

## 2017-04-11 ENCOUNTER — Ambulatory Visit (INDEPENDENT_AMBULATORY_CARE_PROVIDER_SITE_OTHER): Payer: 59 | Admitting: Family

## 2017-04-11 ENCOUNTER — Encounter: Payer: Self-pay | Admitting: Family

## 2017-04-11 VITALS — BP 132/82 | HR 76 | Temp 98.1°F | Resp 18 | Wt 194.8 lb

## 2017-04-11 DIAGNOSIS — Z01818 Encounter for other preprocedural examination: Secondary | ICD-10-CM

## 2017-04-11 DIAGNOSIS — I10 Essential (primary) hypertension: Secondary | ICD-10-CM | POA: Diagnosis not present

## 2017-04-11 NOTE — Progress Notes (Signed)
Subjective:    Patient ID: [name] [name], female    DOB: Aug 27, 1950, 66 y.o.   MRN: [id number]  HPI  [name] [name] is a 66 yr old female who presents today for follow up of her blood pressure.  She is back on her regular blood pressure medications.  She is currently scheduled for foot surgery November 9.  BP Readings from Last 3 Encounters:  04/11/17 132/82  04/05/17 (!) 160/90  03/27/17 (!) 162/72      Review of Systems    see HPI  Past Medical History:  Diagnosis Date  . Allergic rhinitis, cause unspecified   . ASCUS on Pap smear   . Back pain   . CAD (coronary artery disease)   . Constipation   . Depression   . Depressive disorder, not elsewhere classified   . Esophageal reflux   . Fatty liver 04/26/2015  . GERD (gastroesophageal reflux disease)   . Hyperlipidemia   . Hypertension   . Obstructive sleep apnea (adult) (pediatric)   . Personal history of goiter    childhood  . Type II or unspecified type diabetes mellitus without mention of complication, not stated as uncontrolled      Social History   Social History  . Marital status: Married    Spouse name: N/A  . Number of children: 3  . Years of education: N/A   Occupational History  . 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  [employer]   Social History Main Topics  . Smoking status: Former Smoker    Years: 46.00    Types: E-cigarettes    Quit date: 09/23/2012  . Smokeless tobacco: Never Used     Comment: Uses vapor cigarette  . Alcohol use 0.0 oz/week     Comment: occasional  . Drug use: No  . Sexual activity: Not on file   Other Topics Concern  . Not on file   Social History Narrative   Caffeine Use:  2 cups coffee daily   Regular exercise:  No          Past Surgical History:  Procedure Laterality Date  . BUNIONECTOMY    . colposcopy  2012   cervical biopsy (Dr. Benjie Karvonen)  . CORONARY STENT PLACEMENT  2004  . goiter removal  1968  . TUBAL LIGATION  1985    Family  History  Problem Relation Age of Onset  . Cancer Mother        throat, cervical  . Diabetes Mother   . Stroke Mother   . Heart attack Mother   . Heart disease Mother   . Hypertension Mother   . Thyroid disease Mother   . Alcoholism Mother   . Cancer Father        prostate  . Diabetes Sister   . Heart attack Sister   . Hypertension Sister   . Hyperlipidemia Sister   . Colon cancer Neg Hx   . Breast cancer Neg Hx     No Known Allergies  Current Outpatient Prescriptions on File Prior to Visit  Medication Sig Dispense Refill  . aspirin 81 MG tablet Take 81 mg by mouth daily.      Marland Kitchen buPROPion (WELLBUTRIN XL) 150 MG 24 hr tablet Take 450 mg by mouth daily.      . Calcium Carbonate-Vitamin D (CALTRATE 600+D) 600-400 MG-UNIT per tablet Take 1 tablet by mouth 2 (two) times daily.    Marland Kitchen glucose blood (ONE TOUCH  ULTRA TEST) test strip 1 each by Other route 3 (three) times daily. for testing 100 each 0  . Insulin Pen Needle (BD PEN NEEDLE NANO U/F) 32G X 4 MM MISC USE ONCE DAILY WITH INSULIN INJECTION 100 each 0  . Lancets (ONETOUCH ULTRASOFT) lancets TEST BLOOD SUGAR ONCE DAILY 100 each 0  . liraglutide 18 MG/3ML SOPN Inject 0.3 mLs (1.8 mg total) into the skin daily. 3 pen 0  . metFORMIN (GLUCOPHAGE XR) 750 MG 24 hr tablet Take 1 tablet (750 mg total) by mouth 2 (two) times daily. 60 tablet 0  . metoprolol tartrate (LOPRESSOR) 50 MG tablet Take 1 tablet (50 mg total) by mouth 2 (two) times daily. 60 tablet 5  . Multiple Vitamins-Minerals (MULTIVITAMIN ADULTS 50+ PO) Take 1 tablet by mouth daily.    . naltrexone (DEPADE) 50 MG tablet Take 25 mg by mouth 2 (two) times daily.  0  . olmesartan-hydrochlorothiazide (BENICAR HCT) 20-12.5 MG tablet Take 1 tablet by mouth daily. 90 tablet 1  . pantoprazole (PROTONIX) 40 MG tablet Take 1 tablet (40 mg total) by mouth daily. 30 tablet 5  . polyethylene glycol powder (GLYCOLAX/MIRALAX) powder Take 17 g by mouth daily. 3350 g 0  . Probiotic Product  (PROBIOTIC DAILY PO) Take 1 capsule by mouth daily.    . rosuvastatin (CRESTOR) 20 MG tablet take 1 tablet by mouth once daily 30 tablet 5  . Vitamin D, Ergocalciferol, (DRISDOL) 50000 units CAPS capsule Take 1 capsule (50,000 Units total) by mouth every 7 (seven) days. 4 capsule 0   No current facility-administered medications on file prior to visit.     BP 132/82 (BP Location: Right Arm, Patient Position: Sitting, Cuff Size: Normal)   Pulse 76   Temp 98.1 F (36.7 C) (Oral)   Resp 18   Wt 194 lb 12.8 oz (88.4 kg)   SpO2 96%   BMI 34.51 kg/m    Objective:   Physical Exam  Constitutional: She is oriented to person, place, and time. She appears well-developed and well-nourished.  HENT:  Head: Normocephalic and atraumatic.  Cardiovascular: Normal rate, regular rhythm and normal heart sounds.   No murmur heard. Pulmonary/Chest: Effort normal and breath sounds normal. No respiratory distress. She has no wheezes.  Musculoskeletal: She exhibits no edema.  Neurological: She is alert and oriented to person, place, and time.  Psychiatric: She has a normal mood and affect. Her behavior is normal. Judgment and thought content normal.          Assessment & Plan:  Preoperative evaluation/hypertension-follow-up blood pressure is stable for surgery.  Liver function testing urine culture, kidney function electrolytes, liver function, and PT/INR are all stable for surgery.  Chest x-ray is clear.  EKG performed on  1026 noted normal sinus rhythm without acute changes.  Patient is medically cleared for upcoming surgery.  She will need close monitoring following anesthesia due to history of obstructive sleep apnea.  She is advised to hold aspirin for 7 days prior to surgery.

## 2017-04-11 NOTE — Patient Instructions (Signed)
Blood pressure looks great, continue current medications.  Good luck with your upcoming surgery.

## 2017-04-15 ENCOUNTER — Other Ambulatory Visit (INDEPENDENT_AMBULATORY_CARE_PROVIDER_SITE_OTHER): Payer: Self-pay | Admitting: Family Medicine

## 2017-04-15 ENCOUNTER — Other Ambulatory Visit (INDEPENDENT_AMBULATORY_CARE_PROVIDER_SITE_OTHER): Payer: Self-pay | Admitting: Physician Assistant

## 2017-04-15 DIAGNOSIS — E119 Type 2 diabetes mellitus without complications: Secondary | ICD-10-CM

## 2017-04-16 ENCOUNTER — Encounter (HOSPITAL_BASED_OUTPATIENT_CLINIC_OR_DEPARTMENT_OTHER): Payer: Self-pay | Admitting: *Deleted

## 2017-04-17 ENCOUNTER — Ambulatory Visit (INDEPENDENT_AMBULATORY_CARE_PROVIDER_SITE_OTHER): Payer: 59 | Admitting: Physician Assistant

## 2017-04-17 VITALS — BP 144/83 | HR 66 | Temp 98.5°F | Ht 63.0 in | Wt 192.0 lb

## 2017-04-17 DIAGNOSIS — E669 Obesity, unspecified: Secondary | ICD-10-CM

## 2017-04-17 DIAGNOSIS — E559 Vitamin D deficiency, unspecified: Secondary | ICD-10-CM

## 2017-04-17 DIAGNOSIS — Z6834 Body mass index (BMI) 34.0-34.9, adult: Secondary | ICD-10-CM

## 2017-04-17 DIAGNOSIS — Z9189 Other specified personal risk factors, not elsewhere classified: Secondary | ICD-10-CM | POA: Diagnosis not present

## 2017-04-17 DIAGNOSIS — E119 Type 2 diabetes mellitus without complications: Secondary | ICD-10-CM | POA: Diagnosis not present

## 2017-04-17 MED ORDER — GLUCOSE BLOOD VI STRP: 1.0000 | ORAL_STRIP | Freq: Three times a day (TID) | 0 refills | Status: DC

## 2017-04-17 MED ORDER — VITAMIN D (ERGOCALCIFEROL) 1.25 MG (50000 UNIT) PO CAPS: 50000.0000 [IU] | ORAL_CAPSULE | ORAL | 0 refills | Status: DC

## 2017-04-17 MED ORDER — LIRAGLUTIDE 18 MG/3ML ~~LOC~~ SOPN: 1.8000 mg | PEN_INJECTOR | Freq: Every day | SUBCUTANEOUS | 0 refills | Status: DC

## 2017-04-17 MED ORDER — METFORMIN HCL ER 750 MG PO TB24: 750.0000 mg | ORAL_TABLET | Freq: Two times a day (BID) | ORAL | 0 refills | Status: DC

## 2017-04-17 NOTE — Progress Notes (Signed)
Office: [phone]  /  Fax: [phone]   HPI:   Chief Complaint: OBESITY [name] [name] is here to discuss her progress with her obesity treatment plan. She is on the Category 2 plan and is following her eating plan approximately 10 % of the time. She states she is exercising 0 minutes 0 times per week. [name] [name] continues to do well with weight loss. She would like more convenience food options as she will have foot surgery next week. Her weight is 192 lb (87.1 kg) today and has had a weight loss of 3 pounds over a period of 4 weeks since her last visit. She has lost 5 lbs since starting treatment with [place].  Diabetes II [name] [name] has a diagnosis of diabetes type II. [name] [name] states she is not checking BGs at home and denies any hypoglycemic episodes. She has been working on intensive lifestyle modifications including diet, exercise, and weight loss to help control her blood glucose levels.  Vitamin D deficiency [name] [name] has a diagnosis of vitamin D deficiency. She is currently taking vit D and denies nausea, vomiting or muscle weakness.  At risk for osteopenia and osteoporosis [name] [name] is at higher risk of osteopenia and osteoporosis due to vitamin D deficiency.   ALLERGIES: No Known Allergies  MEDICATIONS: Current Outpatient Medications on File Prior to Visit  Medication Sig Dispense Refill  . aspirin 81 MG tablet Take 81 mg by mouth daily.      Marland Kitchen buPROPion (WELLBUTRIN XL) 150 MG 24 hr tablet Take 450 mg by mouth daily.      . Calcium Carbonate-Vitamin D (CALTRATE 600+D) 600-400 MG-UNIT per tablet Take 1 tablet by mouth 2 (two) times daily.    Marland Kitchen glucose blood (ONE TOUCH ULTRA TEST) test strip 1 each by Other route 3 (three) times daily. for testing 100 each 0  . Insulin Pen Needle (BD PEN NEEDLE NANO U/F) 32G X 4 MM MISC USE ONCE DAILY WITH INSULIN INJECTION 100 each 0  . Lancets (ONETOUCH ULTRASOFT) lancets TEST BLOOD SUGAR ONCE DAILY 100 each 0  . liraglutide 18 MG/3ML SOPN Inject 0.3 mLs (1.8 mg  total) into the skin daily. 3 pen 0  . metFORMIN (GLUCOPHAGE XR) 750 MG 24 hr tablet Take 1 tablet (750 mg total) by mouth 2 (two) times daily. 60 tablet 0  . metoprolol tartrate (LOPRESSOR) 50 MG tablet Take 1 tablet (50 mg total) by mouth 2 (two) times daily. 60 tablet 5  . Multiple Vitamins-Minerals (MULTIVITAMIN ADULTS 50+ PO) Take 1 tablet by mouth daily.    . naltrexone (DEPADE) 50 MG tablet Take 25 mg by mouth 2 (two) times daily.  0  . olmesartan-hydrochlorothiazide (BENICAR HCT) 20-12.5 MG tablet Take 1 tablet by mouth daily. 90 tablet 1  . pantoprazole (PROTONIX) 40 MG tablet Take 1 tablet (40 mg total) by mouth daily. 30 tablet 5  . polyethylene glycol powder (GLYCOLAX/MIRALAX) powder Take 17 g by mouth daily. 3350 g 0  . Probiotic Product (PROBIOTIC DAILY PO) Take 1 capsule by mouth daily.    . rosuvastatin (CRESTOR) 20 MG tablet take 1 tablet by mouth once daily 30 tablet 5  . Vitamin D, Ergocalciferol, (DRISDOL) 50000 units CAPS capsule Take 1 capsule (50,000 Units total) by mouth every 7 (seven) days. 4 capsule 0   No current facility-administered medications on file prior to visit.     PAST MEDICAL HISTORY: Past Medical History:  Diagnosis Date  . CAD (coronary artery disease) primary cardioloigst-  dr Stanford Breed   hx  NSTEMI 10-24-2001  s/p  cardiac cath w/ PCI and DES x1 to first diagonal  . Depression   . Fatty liver 04/26/2015  . GERD (gastroesophageal reflux disease)   . History of abnormal cervical Pap smear    ACUS 2012  . History of exercise stress test 05-25-2014  dr Stanford Breed   borderline (indeterminate) with non-specific ST changes (mild ST depression in leads II,III, aVF, & V6, did not meet criteria for ischemia) ;  pt had low intolerence, no cp, normal heartrate and bp response, no arrhythmias  . History of goiter    as child s/p  removal  . History of non-ST elevation myocardial infarction (NSTEMI) 10/24/2001   s/p  PCI and DES to first diagonal  .  Hyperlipidemia   . Hypertension   . OSA on CPAP pulmoloigst-  dr young   per study 11-07-2004  severe osa  . S/P drug eluting coronary stent placement 10/27/2001   x1 to first diagonal   . Seasonal and perennial allergic rhinitis   . Type 2 diabetes mellitus ([place])     PAST SURGICAL HISTORY: Past Surgical History:  Procedure Laterality Date  . BUNIONECTOMY    . CARDIAC CATHETERIZATION  09-11-2002   dr Lia Foyer   well-preserved LVF; continued patency previouly placed stent in diagonal branch;  mild luminal irregularities   . CARDIOVASCULAR STRESS TEST  05-16-2011   dr Stanford Breed   normal nuclear study w/ no evidence ischemia/  normal LV function and wall motion , ef 77%  . CORONARY ANGIOPLASTY WITH STENT PLACEMENT  10-27-2001   dr Lia Foyer   high-grade stenosis first diagonal post PCI and DES (TAXUS);  mild luminal irregularity throughout LAD, RCA, and very mild CFx;  preserved LVF  . goiter removal  1968  . TUBAL LIGATION Bilateral 1985    SOCIAL HISTORY: Social History   Tobacco Use  . Smoking status: Former Smoker    Years: 46.00    Types: E-cigarettes    Last attempt to quit: 09/23/2012    Years since quitting: 4.5  . Smokeless tobacco: Never Used  . Tobacco comment: Uses vapor cigarette  Substance Use Topics  . Alcohol use: Yes    Alcohol/week: 0.0 oz    Comment: occasional  . Drug use: No    FAMILY HISTORY: Family History  Problem Relation Age of Onset  . Cancer Mother        throat, cervical  . Diabetes Mother   . Stroke Mother   . Heart attack Mother   . Heart disease Mother   . Hypertension Mother   . Thyroid disease Mother   . Alcoholism Mother   . Cancer Father        prostate  . Diabetes Sister   . Heart attack Sister   . Hypertension Sister   . Hyperlipidemia Sister   . Colon cancer Neg Hx   . Breast cancer Neg Hx     ROS: Review of Systems  Constitutional: Positive for weight loss.  Gastrointestinal: Negative for nausea and vomiting.    Musculoskeletal:       Negative muscle weakness  Endo/Heme/Allergies:       Negative hypoglycemia    PHYSICAL EXAM: Blood pressure (!) 144/83, pulse 66, temperature 98.5 F (36.9 C), temperature source Oral, height 5\' 3"  (1.6 m), weight 192 lb (87.1 kg), SpO2 96 %. Body mass index is 34.01 kg/m. Physical Exam  Constitutional: She is oriented to person, place, and time. She appears well-developed and well-nourished.  Cardiovascular:  Normal rate.  Pulmonary/Chest: Effort normal.  Musculoskeletal: Normal range of motion.  Neurological: She is oriented to person, place, and time.  Skin: Skin is warm and dry.  Psychiatric: She has a normal mood and affect. Her behavior is normal.  Vitals reviewed.   RECENT LABS AND TESTS: BMET    Component Value Date/Time   NA 142 04/05/2017 1212   NA 142 12/20/2016 0941   K 3.9 04/05/2017 1212   CL 105 04/05/2017 1212   CO2 32 04/05/2017 1212   GLUCOSE 87 04/05/2017 1212   BUN 17 04/05/2017 1212   BUN 15 12/20/2016 0941   CREATININE 0.72 04/05/2017 1212   CREATININE 0.70 12/15/2014 1949   CALCIUM 9.8 04/05/2017 1212   GFRNONAA 85 12/20/2016 0941   GFRNONAA 80 10/27/2013 0910   GFRAA 98 12/20/2016 0941   GFRAA >89 10/27/2013 0910   Lab Results  Component Value Date   HGBA1C 6.1 03/27/2017   HGBA1C 6.3 (H) 12/20/2016   HGBA1C 7.1 (H) 09/05/2016   HGBA1C 7.0 (H) 08/22/2016   HGBA1C 6.7 (H) 05/23/2016   Lab Results  Component Value Date   INSULIN 62.0 (H) 12/20/2016   INSULIN 66.8 (H) 09/05/2016   CBC    Component Value Date/Time   WBC 5.3 04/05/2017 1212   RBC 4.49 04/05/2017 1212   HGB 13.1 04/05/2017 1212   HGB 13.3 09/05/2016 1202   HCT 39.4 04/05/2017 1212   HCT 42.1 09/05/2016 1202   PLT 173.0 04/05/2017 1212   MCV 87.9 04/05/2017 1212   MCV 89 09/05/2016 1202   MCH 28.0 09/05/2016 1202   MCH 29.2 05/09/2011 0931   MCHC 33.1 04/05/2017 1212   RDW 16.0 (H) 04/05/2017 1212   RDW 15.7 (H) 09/05/2016 1202    LYMPHSABS 1.8 04/05/2017 1212   LYMPHSABS 1.7 09/05/2016 1202   MONOABS 0.4 04/05/2017 1212   EOSABS 0.1 04/05/2017 1212   EOSABS 0.1 09/05/2016 1202   BASOSABS 0.0 04/05/2017 1212   BASOSABS 0.0 09/05/2016 1202   Iron/TIBC/Ferritin/ %Sat    Component Value Date/Time   FERRITIN 37.3 12/29/2014 0856   Lipid Panel     Component Value Date/Time   CHOL 132 09/05/2016 1202   TRIG 139 09/05/2016 1202   HDL 53 09/05/2016 1202   CHOLHDL 2 05/23/2016 0957   VLDL 24.8 05/23/2016 0957   LDLCALC 51 09/05/2016 1202   LDLDIRECT 77.7 12/27/2009 0942   Hepatic Function Panel     Component Value Date/Time   PROT 6.5 04/05/2017 1212   PROT 6.8 12/20/2016 0941   ALBUMIN 4.1 04/05/2017 1212   ALBUMIN 4.4 12/20/2016 0941   AST 20 04/05/2017 1212   ALT 34 04/05/2017 1212   ALKPHOS 52 04/05/2017 1212   BILITOT 0.7 04/05/2017 1212   BILITOT 0.4 12/20/2016 0941   BILIDIR 0.2 04/05/2017 1212   IBILI 0.5 10/27/2013 0910      Component Value Date/Time   TSH 0.624 09/05/2016 1202   TSH 0.59 12/21/2014 0956   TSH 0.68 05/13/2014 0927    ASSESSMENT AND PLAN: Type 2 diabetes mellitus without complication, without long-term current use of insulin (HCC) - Plan: liraglutide 18 MG/3ML SOPN, metFORMIN (GLUCOPHAGE XR) 750 MG 24 hr tablet, glucose blood (ONE TOUCH ULTRA TEST) test strip  Vitamin D deficiency - Plan: Vitamin D, Ergocalciferol, (DRISDOL) 50000 units CAPS capsule  At risk for osteoporosis  Class 1 obesity with serious comorbidity and body mass index (BMI) of 34.0 to 34.9 in adult, unspecified obesity type  PLAN:  Diabetes II [name] [name] has been given extensive diabetes education by myself today including ideal fasting and post-prandial blood glucose readings, individual ideal Hgb A1c goals  and hypoglycemia prevention. We discussed the importance of good blood sugar control to decrease the likelihood of diabetic complications such as nephropathy, neuropathy, limb loss, blindness, coronary  artery disease, and death. We discussed the importance of intensive lifestyle modification including diet, exercise and weight loss as the first line treatment for diabetes. Lynsie agrees to continue victoza (patient at 1.8 mg) 5 pens with no refills and metformin 150 mg bid #60 with no refills and we will refill strips. Santa agrees to follow up at the agreed upon time.  Vitamin D Deficiency [name] [name] was informed that low vitamin D levels contributes to fatigue and are associated with obesity, breast, and colon cancer. She agrees to continue to take prescription Vit D @50 ,000 IU every week #4 with no refills and will follow up for routine testing of vitamin D, at least 2-3 times per year. She was informed of the risk of over-replacement of vitamin D and agrees to not increase her dose unless he discusses this with [place] first. [name] [name] agrees to follow up with our clinic in 6 weeks.  At risk for osteopenia and osteoporosis [name] [name] is at risk for osteopenia and osteoporosis due to her vitamin D deficiency. She was encouraged to take her vitamin D and follow her higher calcium diet and increase strengthening exercise to help strengthen her bones and decrease her risk of osteopenia and osteoporosis.  Obesity [name] [name] is currently in the action stage of change. As such, her goal is to continue with weight loss efforts She has agreed to keep a food journal with 1100 to 1300 calories and 85 grams of protein daily [name] [name] has been instructed to work up to a goal of 150 minutes of combined cardio and strengthening exercise per week for weight loss and overall health benefits. We discussed the following Behavioral Modification Strategies today: increasing lean protein intake and keep a strict food journal  [name] [name] has agreed to follow up with our clinic in 6 weeks. She was informed of the importance of frequent follow up visits to maximize her success with intensive lifestyle modifications for her multiple health  conditions.  I, Doreene Nest, am acting as transcriptionist for Lacy Duverney, PA-C  I have reviewed the above documentation for accuracy and completeness, and I agree with the above. -Lacy Duverney, PA-C  I have reviewed the above note and agree with the plan. -Dennard Nip, MD   OBESITY BEHAVIORAL INTERVENTION VISIT  Today's visit was # 14 out of 22.  Starting weight: 197 lbs Starting date: 09/05/16 Today's weight : 192 lbs  Today's date: 04/17/2017 Total lbs lost to date: 5 (Patients must lose 7 lbs in the first 6 months to continue with counseling)   ASK: We discussed the diagnosis of obesity with [name] [name] today and [name] [name] agreed to give [place] permission to discuss obesity behavioral modification therapy today.  ASSESS: [name] [name] has the diagnosis of obesity and her BMI today is 34.02 [name] [name] is in the action stage of change   ADVISE: [name] [name] was educated on the multiple health risks of obesity as well as the benefit of weight loss to improve her health. She was advised of the need for long term treatment and the importance of lifestyle modifications.  AGREE: Multiple dietary modification options and treatment options were discussed and  [name] [name] agreed to keep a food journal with 1100 to  1300 calories and 85 grams of protein daily We discussed the following Behavioral Modification Strategies today: increasing lean protein intake and keep a strict food journal

## 2017-04-18 ENCOUNTER — Other Ambulatory Visit: Payer: Self-pay

## 2017-04-18 ENCOUNTER — Encounter (HOSPITAL_BASED_OUTPATIENT_CLINIC_OR_DEPARTMENT_OTHER): Payer: Self-pay | Admitting: *Deleted

## 2017-04-18 ENCOUNTER — Ambulatory Visit: Payer: Self-pay | Admitting: Podiatry

## 2017-04-18 ENCOUNTER — Inpatient Hospital Stay (HOSPITAL_COMMUNITY): Admission: RE | Admit: 2017-04-18 | Payer: 59 | Source: Ambulatory Visit

## 2017-04-18 NOTE — Progress Notes (Signed)
NPO AFTER MN W/ EXCEPTION CLEAR LIQUIDS UNTIL 0600 (NO CREAM/ MILK PRODUCTS).  ARRIVE AT 1000.  CURRENT LAB RESULTS , EKG , H&P (PCP OFFICE NOTE), AND LAST CARDIOLOGIST NOTE IN CHART .  WILL TAKE WELLBUTRIN, LOPRESSOR, AND PROTONIX AM DOS W/ SIPS OF WATER.

## 2017-04-19 ENCOUNTER — Ambulatory Visit (HOSPITAL_BASED_OUTPATIENT_CLINIC_OR_DEPARTMENT_OTHER)
Admission: RE | Admit: 2017-04-19 | Discharge: 2017-04-19 | Disposition: A | Discharge status: Home / Self Care | Payer: 59 | Source: Ambulatory Visit | Attending: Podiatry | Admitting: Podiatry

## 2017-04-19 ENCOUNTER — Other Ambulatory Visit: Payer: Self-pay

## 2017-04-19 ENCOUNTER — Ambulatory Visit (HOSPITAL_BASED_OUTPATIENT_CLINIC_OR_DEPARTMENT_OTHER): Payer: 59 | Admitting: Anesthesiology

## 2017-04-19 ENCOUNTER — Encounter (HOSPITAL_BASED_OUTPATIENT_CLINIC_OR_DEPARTMENT_OTHER)
Admission: RE | Disposition: A | Discharge status: Home / Self Care | Payer: Self-pay | Source: Ambulatory Visit | Attending: Podiatry

## 2017-04-19 ENCOUNTER — Encounter (HOSPITAL_BASED_OUTPATIENT_CLINIC_OR_DEPARTMENT_OTHER): Payer: Self-pay | Admitting: *Deleted

## 2017-04-19 DIAGNOSIS — Z833 Family history of diabetes mellitus: Secondary | ICD-10-CM | POA: Insufficient documentation

## 2017-04-19 DIAGNOSIS — Z8042 Family history of malignant neoplasm of prostate: Secondary | ICD-10-CM | POA: Diagnosis not present

## 2017-04-19 DIAGNOSIS — Z794 Long term (current) use of insulin: Secondary | ICD-10-CM | POA: Insufficient documentation

## 2017-04-19 DIAGNOSIS — E785 Hyperlipidemia, unspecified: Secondary | ICD-10-CM | POA: Insufficient documentation

## 2017-04-19 DIAGNOSIS — M21611 Bunion of right foot: Secondary | ICD-10-CM | POA: Insufficient documentation

## 2017-04-19 DIAGNOSIS — F329 Major depressive disorder, single episode, unspecified: Secondary | ICD-10-CM | POA: Diagnosis not present

## 2017-04-19 DIAGNOSIS — M7751 Other enthesopathy of right foot: Secondary | ICD-10-CM | POA: Insufficient documentation

## 2017-04-19 DIAGNOSIS — G4733 Obstructive sleep apnea (adult) (pediatric): Secondary | ICD-10-CM | POA: Insufficient documentation

## 2017-04-19 DIAGNOSIS — Z8249 Family history of ischemic heart disease and other diseases of the circulatory system: Secondary | ICD-10-CM | POA: Diagnosis not present

## 2017-04-19 DIAGNOSIS — M2041 Other hammer toe(s) (acquired), right foot: Secondary | ICD-10-CM | POA: Insufficient documentation

## 2017-04-19 DIAGNOSIS — I10 Essential (primary) hypertension: Secondary | ICD-10-CM | POA: Diagnosis not present

## 2017-04-19 DIAGNOSIS — K219 Gastro-esophageal reflux disease without esophagitis: Secondary | ICD-10-CM | POA: Diagnosis not present

## 2017-04-19 DIAGNOSIS — Z800 Family history of malignant neoplasm of digestive organs: Secondary | ICD-10-CM | POA: Insufficient documentation

## 2017-04-19 DIAGNOSIS — Z79899 Other long term (current) drug therapy: Secondary | ICD-10-CM | POA: Insufficient documentation

## 2017-04-19 DIAGNOSIS — Z8349 Family history of other endocrine, nutritional and metabolic diseases: Secondary | ICD-10-CM | POA: Insufficient documentation

## 2017-04-19 DIAGNOSIS — Z7982 Long term (current) use of aspirin: Secondary | ICD-10-CM | POA: Insufficient documentation

## 2017-04-19 DIAGNOSIS — M216X1 Other acquired deformities of right foot: Secondary | ICD-10-CM | POA: Insufficient documentation

## 2017-04-19 DIAGNOSIS — Z9851 Tubal ligation status: Secondary | ICD-10-CM | POA: Diagnosis not present

## 2017-04-19 DIAGNOSIS — I2510 Atherosclerotic heart disease of native coronary artery without angina pectoris: Secondary | ICD-10-CM | POA: Diagnosis not present

## 2017-04-19 DIAGNOSIS — Z8049 Family history of malignant neoplasm of other genital organs: Secondary | ICD-10-CM | POA: Insufficient documentation

## 2017-04-19 DIAGNOSIS — E119 Type 2 diabetes mellitus without complications: Secondary | ICD-10-CM | POA: Diagnosis not present

## 2017-04-19 DIAGNOSIS — Z811 Family history of alcohol abuse and dependence: Secondary | ICD-10-CM | POA: Insufficient documentation

## 2017-04-19 DIAGNOSIS — Z955 Presence of coronary angioplasty implant and graft: Secondary | ICD-10-CM | POA: Insufficient documentation

## 2017-04-19 DIAGNOSIS — Z87891 Personal history of nicotine dependence: Secondary | ICD-10-CM | POA: Diagnosis not present

## 2017-04-19 DIAGNOSIS — Z9889 Other specified postprocedural states: Secondary | ICD-10-CM | POA: Insufficient documentation

## 2017-04-19 DIAGNOSIS — M205X1 Other deformities of toe(s) (acquired), right foot: Secondary | ICD-10-CM | POA: Insufficient documentation

## 2017-04-19 DIAGNOSIS — Z823 Family history of stroke: Secondary | ICD-10-CM | POA: Insufficient documentation

## 2017-04-19 HISTORY — DX: Personal history of other endocrine, nutritional and metabolic disease: Z86.39

## 2017-04-19 HISTORY — DX: Other seasonal allergic rhinitis: J30.2

## 2017-04-19 HISTORY — DX: Other constipation: K59.09

## 2017-04-19 HISTORY — DX: Presence of dental prosthetic device (complete) (partial): Z97.2

## 2017-04-19 HISTORY — DX: Presence of spectacles and contact lenses: Z97.3

## 2017-04-19 HISTORY — DX: Other seasonal allergic rhinitis: J30.89

## 2017-04-19 HISTORY — DX: Vitamin D deficiency, unspecified: E55.9

## 2017-04-19 HISTORY — DX: Old myocardial infarction: I25.2

## 2017-04-19 HISTORY — DX: Obstructive sleep apnea (adult) (pediatric): Z99.89

## 2017-04-19 HISTORY — PX: HALLUX VALGUS LAPIDUS: SHX6626

## 2017-04-19 HISTORY — PX: BUNIONECTOMY: SHX129

## 2017-04-19 HISTORY — DX: Type 2 diabetes mellitus without complications: E11.9

## 2017-04-19 HISTORY — PX: METATARSAL OSTEOTOMY: SHX1641

## 2017-04-19 HISTORY — DX: Obstructive sleep apnea (adult) (pediatric): G47.33

## 2017-04-19 HISTORY — PX: HAMMER TOE SURGERY: SHX385

## 2017-04-19 HISTORY — DX: Personal history of other diseases of the female genital tract: Z87.42

## 2017-04-19 HISTORY — DX: Presence of coronary angioplasty implant and graft: Z95.5

## 2017-04-19 HISTORY — DX: Pain in right foot: M79.671

## 2017-04-19 HISTORY — DX: Personal history of other medical treatment: Z92.89

## 2017-04-19 HISTORY — DX: Unspecified acquired deformity of right lower leg: M21.961

## 2017-04-19 LAB — POCT I-STAT, CHEM 8
BUN: 12 mg/dL (ref 6–20)
Calcium, Ion: 1.24 mmol/L (ref 1.15–1.40)
Chloride: 100 mmol/L — ABNORMAL LOW (ref 101–111)
Creatinine, Ser: 0.7 mg/dL (ref 0.44–1.00)
Glucose, Bld: 114 mg/dL — ABNORMAL HIGH (ref 65–99)
HCT: 63 % — ABNORMAL HIGH (ref 36.0–46.0)
Hemoglobin: 21.4 g/dL (ref 12.0–15.0)
Potassium: 3.6 mmol/L (ref 3.5–5.1)
Sodium: 145 mmol/L (ref 135–145)
TCO2: 27 mmol/L (ref 22–32)

## 2017-04-19 SURGERY — BUNIONECTOMY, LAPIDUS
Anesthesia: General | Laterality: Right

## 2017-04-19 MED ORDER — PROMETHAZINE HCL 12.5 MG PO TABS
12.5000 mg | ORAL_TABLET | ORAL | Status: DC | PRN
  Filled 2017-04-19: qty 1

## 2017-04-19 MED ORDER — MIDAZOLAM HCL 2 MG/2ML IJ SOLN
1.0000 mg | Freq: Once | INTRAMUSCULAR | Status: AC
  Administered 2017-04-19: 1 mg via INTRAVENOUS
  Filled 2017-04-19: qty 1

## 2017-04-19 MED ORDER — MIDAZOLAM HCL 2 MG/2ML IJ SOLN
INTRAMUSCULAR | Status: DC | PRN
  Administered 2017-04-19 (×2): 1 mg via INTRAVENOUS

## 2017-04-19 MED ORDER — ONDANSETRON HCL 4 MG/2ML IJ SOLN
INTRAMUSCULAR | Status: AC
  Filled 2017-04-19: qty 2

## 2017-04-19 MED ORDER — FENTANYL CITRATE (PF) 100 MCG/2ML IJ SOLN
INTRAMUSCULAR | Status: AC
  Filled 2017-04-19: qty 2

## 2017-04-19 MED ORDER — BUPIVACAINE HCL 0.5 % IJ SOLN
INTRAMUSCULAR | Status: DC | PRN
  Administered 2017-04-19: 10 mL

## 2017-04-19 MED ORDER — FENTANYL CITRATE (PF) 100 MCG/2ML IJ SOLN
INTRAMUSCULAR | Status: DC | PRN
  Administered 2017-04-19 (×2): 25 ug via INTRAVENOUS
  Administered 2017-04-19: 50 ug via INTRAVENOUS
  Administered 2017-04-19 (×3): 25 ug via INTRAVENOUS

## 2017-04-19 MED ORDER — SODIUM CHLORIDE 0.9 % IV SOLN
INTRAVENOUS | Status: DC | PRN
  Administered 2017-04-19: 500 mL

## 2017-04-19 MED ORDER — WHITE PETROLATUM EX OINT
TOPICAL_OINTMENT | CUTANEOUS | Status: AC
  Filled 2017-04-19: qty 5

## 2017-04-19 MED ORDER — CEFAZOLIN SODIUM-DEXTROSE 2-4 GM/100ML-% IV SOLN
INTRAVENOUS | Status: AC
  Filled 2017-04-19: qty 100

## 2017-04-19 MED ORDER — CEFAZOLIN SODIUM 1 G IJ SOLR
INTRAMUSCULAR | Status: AC
  Filled 2017-04-19: qty 20

## 2017-04-19 MED ORDER — ONDANSETRON HCL 4 MG/2ML IJ SOLN
INTRAMUSCULAR | Status: DC | PRN
  Administered 2017-04-19: 4 mg via INTRAVENOUS

## 2017-04-19 MED ORDER — PROPOFOL 10 MG/ML IV BOLUS
INTRAVENOUS | Status: AC
  Filled 2017-04-19: qty 20

## 2017-04-19 MED ORDER — EPHEDRINE SULFATE 50 MG/ML IJ SOLN
INTRAMUSCULAR | Status: DC | PRN
  Administered 2017-04-19: 10 mg via INTRAVENOUS

## 2017-04-19 MED ORDER — BUPIVACAINE LIPOSOME 1.3 % IJ SUSP
INTRAMUSCULAR | Status: DC | PRN
  Administered 2017-04-19: 10 mL

## 2017-04-19 MED ORDER — CEFAZOLIN SODIUM-DEXTROSE 2-4 GM/100ML-% IV SOLN
2.0000 g | INTRAVENOUS | Status: AC
  Administered 2017-04-19 (×2): 2 g via INTRAVENOUS
  Filled 2017-04-19: qty 100

## 2017-04-19 MED ORDER — ACETAMINOPHEN 325 MG PO TABS
650.0000 mg | ORAL_TABLET | ORAL | Status: DC | PRN
  Filled 2017-04-19: qty 2

## 2017-04-19 MED ORDER — PROPOFOL 10 MG/ML IV BOLUS
INTRAVENOUS | Status: DC | PRN
  Administered 2017-04-19: 160 mg via INTRAVENOUS

## 2017-04-19 MED ORDER — LIDOCAINE 2% (20 MG/ML) 5 ML SYRINGE
INTRAMUSCULAR | Status: AC
  Filled 2017-04-19: qty 5

## 2017-04-19 MED ORDER — DEXAMETHASONE SODIUM PHOSPHATE 10 MG/ML IJ SOLN
INTRAMUSCULAR | Status: DC | PRN
  Administered 2017-04-19: 4 mg via INTRAVENOUS

## 2017-04-19 MED ORDER — EPHEDRINE 5 MG/ML INJ
INTRAVENOUS | Status: AC
  Filled 2017-04-19: qty 10

## 2017-04-19 MED ORDER — LIDOCAINE 2% (20 MG/ML) 5 ML SYRINGE
INTRAMUSCULAR | Status: DC | PRN
  Administered 2017-04-19: 60 mg via INTRAVENOUS

## 2017-04-19 MED ORDER — FENTANYL CITRATE (PF) 100 MCG/2ML IJ SOLN
100.0000 ug | Freq: Once | INTRAMUSCULAR | Status: AC
  Administered 2017-04-19: 100 ug via INTRAVENOUS
  Filled 2017-04-19: qty 2

## 2017-04-19 MED ORDER — SODIUM CHLORIDE 0.9% FLUSH
3.0000 mL | INTRAVENOUS | Status: DC | PRN
  Filled 2017-04-19: qty 3

## 2017-04-19 MED ORDER — LACTATED RINGERS IV SOLN
INTRAVENOUS | Status: DC
  Administered 2017-04-19 (×2): via INTRAVENOUS
  Filled 2017-04-19: qty 1000

## 2017-04-19 MED ORDER — MIDAZOLAM HCL 2 MG/2ML IJ SOLN
INTRAMUSCULAR | Status: AC
  Filled 2017-04-19: qty 2

## 2017-04-19 MED ORDER — DEXAMETHASONE SODIUM PHOSPHATE 10 MG/ML IJ SOLN
INTRAMUSCULAR | Status: AC
  Filled 2017-04-19: qty 1

## 2017-04-19 MED ORDER — OXYCODONE-ACETAMINOPHEN 5-325 MG PO TABS
1.0000 | ORAL_TABLET | ORAL | Status: DC | PRN
  Filled 2017-04-19: qty 2

## 2017-04-19 SURGICAL SUPPLY — 114 items
APL SKNCLS STERI-STRIP NONHPOA (GAUZE/BANDAGES/DRESSINGS) ×2
BAG URINE DRAINAGE (UROLOGICAL SUPPLIES) IMPLANT
BANDAGE CO FLEX L/F 2IN X 5YD (GAUZE/BANDAGES/DRESSINGS) ×2 IMPLANT
BANDAGE COBAN STERILE 2 (GAUZE/BANDAGES/DRESSINGS) ×1 IMPLANT
BANDAGE ELASTIC 6 VELCRO ST LF (GAUZE/BANDAGES/DRESSINGS) ×2 IMPLANT
BENZOIN TINCTURE PRP APPL 2/3 (GAUZE/BANDAGES/DRESSINGS) ×4 IMPLANT
BIT DRILL 1.7 LNG CANN (DRILL) ×1 IMPLANT
BIT DRILL 2 FENESTRATED (MISCELLANEOUS) IMPLANT
BIT DRILL CANNULTD 2.6 X 130MM (DRILL) IMPLANT
BIT DRILL SOLID 2.0 X 110MM (DRILL) IMPLANT
BIT DRILLL 2 FENESTRATED (MISCELLANEOUS) ×1
BLADE AVERAGE 25X9 (BLADE) ×3 IMPLANT
BLADE FLAT COURSE (BLADE) IMPLANT
BLADE OSC/SAG .038X5.5 CUT EDG (BLADE) ×1 IMPLANT
BLADE OSC/SAGITTAL MD 5.5X18 (BLADE) ×2 IMPLANT
BLADE SAW SAGI 13.0X70X1.19 (BLADE) IMPLANT
BLADE SURG 15 STRL LF DISP TIS (BLADE) ×4 IMPLANT
BLADE SURG 15 STRL SS (BLADE) ×24
BNDG CMPR 9X4 STRL LF SNTH (GAUZE/BANDAGES/DRESSINGS) ×1
BNDG COHESIVE 4X5 TAN NS LF (GAUZE/BANDAGES/DRESSINGS) ×2 IMPLANT
BNDG CONFORM 2 STRL LF (GAUZE/BANDAGES/DRESSINGS) ×3 IMPLANT
BNDG ESMARK 4X9 LF (GAUZE/BANDAGES/DRESSINGS) ×2 IMPLANT
BUR SIDE CUT 44.8 STRL (BURR) IMPLANT
BURR SIDE CUT 44.8 STRL (BURR)
CATH FOLEY 2WAY SLVR  5CC 16FR (CATHETERS) ×1
CATH FOLEY 2WAY SLVR 5CC 16FR (CATHETERS) IMPLANT
COUNTERSICK 4.0 HEADED (MISCELLANEOUS) ×2
COUNTERSINK HEADED 2.5 (Screw) ×2 IMPLANT
COVER BACK TABLE 60X90IN (DRAPES) ×2 IMPLANT
COVER MAYO STAND STRL (DRAPES) ×2 IMPLANT
COVER SURGICAL LIGHT HANDLE (MISCELLANEOUS) ×1 IMPLANT
CUFF TOURNIQUET SINGLE 18IN (TOURNIQUET CUFF) ×2 IMPLANT
DRAPE EXTREMITY T 121X128X90 (DRAPE) ×2 IMPLANT
DRAPE LG THREE QUARTER DISP (DRAPES) ×1 IMPLANT
DRAPE OEC MINIVIEW 54X84 (DRAPES) ×2 IMPLANT
DRILL CANNULATED 2.6 X 130MM (DRILL) ×2
DRILL SOLID 2.0 X 110MM (DRILL) ×2
DRSG EMULSION OIL 3X3 NADH (GAUZE/BANDAGES/DRESSINGS) IMPLANT
DURAPREP 26ML APPLICATOR (WOUND CARE) ×2 IMPLANT
ELECT REM PT RETURN 9FT ADLT (ELECTROSURGICAL) ×2
ELECTRODE REM PT RTRN 9FT ADLT (ELECTROSURGICAL) ×1 IMPLANT
GAUZE SPONGE 4X4 12PLY STRL LF (GAUZE/BANDAGES/DRESSINGS) IMPLANT
GAUZE SPONGE 4X4 16PLY XRAY LF (GAUZE/BANDAGES/DRESSINGS) ×5 IMPLANT
GAUZE XEROFORM 1X8 LF (GAUZE/BANDAGES/DRESSINGS) ×3 IMPLANT
GLOVE BIO SURGEON STRL SZ7.5 (GLOVE) ×4 IMPLANT
GLOVE BIOGEL PI IND STRL 7.5 (GLOVE) ×1 IMPLANT
GLOVE BIOGEL PI INDICATOR 7.5 (GLOVE) ×4
GLOVE INDICATOR 7.5 STRL GRN (GLOVE) ×2 IMPLANT
GOWN STRL REUS W/ TWL LRG LVL3 (GOWN DISPOSABLE) ×1 IMPLANT
GOWN STRL REUS W/ TWL XL LVL3 (GOWN DISPOSABLE) ×1 IMPLANT
GOWN STRL REUS W/TWL LRG LVL3 (GOWN DISPOSABLE) ×3 IMPLANT
GOWN STRL REUS W/TWL XL LVL3 (GOWN DISPOSABLE) ×2
K-WIRE SMOOTH TROCAR 2.0X150 (WIRE) ×4
K-WIRE SNGL END 1.2X150 (MISCELLANEOUS) ×4
KIT RM TURNOVER CYSTO AR (KITS) ×2 IMPLANT
KWIRE 4.0 X .045IN (WIRE) IMPLANT
KWIRE SMOOTH TROCAR 2.0X150 (WIRE) IMPLANT
KWIRE SNGL END 1.2X150 (MISCELLANEOUS) IMPLANT
LOOP VESSEL MAXI BLUE (MISCELLANEOUS) ×2 IMPLANT
MANIFOLD NEPTUNE II (INSTRUMENTS) IMPLANT
MARKER PEN SURG W/LABELS BLK (STERILIZATION PRODUCTS) ×1 IMPLANT
NDL SAFETY ECLIPSE 18X1.5 (NEEDLE) ×1 IMPLANT
NEEDLE 27GAX1X1/2 (NEEDLE) ×2 IMPLANT
NEEDLE HYPO 18GX1.5 SHARP (NEEDLE) ×2
NEEDLE HYPO 22GX1.5 SAFETY (NEEDLE) ×2 IMPLANT
NS IRRIG 500ML POUR BTL (IV SOLUTION) ×2 IMPLANT
PACK BASIN DAY SURGERY FS (CUSTOM PROCEDURE TRAY) ×2 IMPLANT
PADDING CAST ABS 4INX4YD NS (CAST SUPPLIES) ×1
PADDING CAST ABS COTTON 4X4 ST (CAST SUPPLIES) ×1 IMPLANT
PADDING CAST SYNTHETIC 2 (CAST SUPPLIES)
PADDING CAST SYNTHETIC 2X4 NS (CAST SUPPLIES) IMPLANT
PADDING CAST SYNTHETIC 3 NS LF (CAST SUPPLIES)
PADDING CAST SYNTHETIC 3X4 NS (CAST SUPPLIES) IMPLANT
PENCIL BUTTON HOLSTER BLD 10FT (ELECTRODE) ×2 IMPLANT
PLATE STD RIGHT LAPIDUS (Plate) IMPLANT
RASP SM TEAR CROSS CUT (RASP) ×1 IMPLANT
SCOTCHCAST PLUS 5X4 WHITE (CAST SUPPLIES) IMPLANT
SCREW 2.5 X 14MM (Screw) ×1 IMPLANT
SCREW CANN 4.0X40 SHORT THREAD (Screw) ×1 IMPLANT
SCREW COUNTERSINK 4.0 HEADED (MISCELLANEOUS) IMPLANT
SCREW COUNTERSINK HEADED 2.5 (Screw) IMPLANT
SCREW LOCK PLATE R3 2.7X15 (Screw) ×1 IMPLANT
SCREW LOCK PLATE R3 2.7X16 ×2 IMPLANT
SCREW LOCK PLATE R3 2.7X17 (Screw) ×1 IMPLANT
SCREW NON LOCK PLATE 2.7X16M (Screw) ×1 IMPLANT
SPONGE GAUZE 4X4 12PLY (GAUZE/BANDAGES/DRESSINGS) ×4 IMPLANT
SPONGE LAP 4X18 X RAY DECT (DISPOSABLE) ×2 IMPLANT
STANDARD R LAPIDUS PLATE (Plate) ×2 IMPLANT
STAPLE NITINAL (Staple) ×1 IMPLANT
STAPLE NITINOL 10X10 (Staple) IMPLANT
STOCKINETTE 6  STRL (DRAPES) ×1
STOCKINETTE 6 STRL (DRAPES) ×1 IMPLANT
STOCKINETTE SYNTHETIC 4 NONSTR (MISCELLANEOUS) ×2 IMPLANT
STRIP CLOSURE SKIN 1/4X4 (GAUZE/BANDAGES/DRESSINGS) ×6 IMPLANT
SUCTION FRAZIER HANDLE 10FR (MISCELLANEOUS) ×1
SUCTION TUBE FRAZIER 10FR DISP (MISCELLANEOUS) ×1 IMPLANT
SUT ETHILON 4 0 PS 2 18 (SUTURE) ×4 IMPLANT
SUT ETHILON 5 0 PS 2 18 (SUTURE) IMPLANT
SUT MNCRL AB 4-0 PS2 18 (SUTURE) ×2 IMPLANT
SUT VIC AB 3-0 FS2 27 (SUTURE) ×2 IMPLANT
SUT VIC AB 3-0 SH 27 (SUTURE) ×4
SUT VIC AB 3-0 SH 27X BRD (SUTURE) ×1 IMPLANT
SUT VIC AB 4-0 SH 27 (SUTURE) ×2
SUT VIC AB 4-0 SH 27XANBCTRL (SUTURE) ×1 IMPLANT
SUT VICRYL 4-0 PS2 18IN ABS (SUTURE) ×4 IMPLANT
SYR 3ML 18GX1 1/2 (SYRINGE) ×2 IMPLANT
SYR BULB 3OZ (MISCELLANEOUS) ×2 IMPLANT
SYR CONTROL 10ML LL (SYRINGE) ×4 IMPLANT
SYRINGE 12CC LL (MISCELLANEOUS) ×2 IMPLANT
TAPE STRIPS DRAPE STRL (GAUZE/BANDAGES/DRESSINGS) ×1 IMPLANT
TUBE CONNECTING 12X1/4 (SUCTIONS) ×2 IMPLANT
UNDERPAD 30X30 INCONTINENT (UNDERPADS AND DIAPERS) ×2 IMPLANT
WATER STERILE IRR 500ML POUR (IV SOLUTION) ×2 IMPLANT
WIRE OLIVE SMOOTH 1.4MMX60MM (WIRE) ×2 IMPLANT

## 2017-04-19 NOTE — Transfer of Care (Signed)
Immediate Anesthesia Transfer of Care Note  Patient: [name] [name]  Procedure(s) Performed: Procedure(s) (LRB): HALLUX VALGUS LAPIDUS FUSION (Right) 2ND METATARSAL OSTEOTOMY (Right) HAMMER TOE CORRECTION RIGHT 2ND (Right) RIGHT BUNIONECTOMY (Right)  Patient Location: PACU  Anesthesia Type: General  Level of Consciousness: awake, oriented, sedated and patient cooperative  Airway & Oxygen Therapy: Patient Spontanous Breathing and Patient connected to face mask oxygen  Post-op Assessment: Report given to PACU RN and Post -op Vital signs reviewed and stable  Post vital signs: Reviewed and stable  Complications: No apparent anesthesia complications  Last Vitals:  Vitals:   04/19/17 1200 04/19/17 1714  BP: (!) 152/78 (!) 159/81  Pulse: 68 75  Resp: 16 19  Temp:  36.7 C  SpO2: 100% 93%    Last Pain:  Vitals:   04/19/17 1003  TempSrc: Oral      Patients Stated Pain Goal: 8 (04/19/17 1033)

## 2017-04-19 NOTE — Anesthesia Postprocedure Evaluation (Signed)
Anesthesia Post Note  Patient: [name] [name]  Procedure(s) Performed: HALLUX VALGUS LAPIDUS FUSION (Right ) 2ND METATARSAL OSTEOTOMY (Right ) HAMMER TOE CORRECTION RIGHT 2ND (Right ) RIGHT BUNIONECTOMY (Right )     Patient location during evaluation: PACU Anesthesia Type: General Level of consciousness: sedated and patient cooperative Pain management: pain level controlled Vital Signs Assessment: post-procedure vital signs reviewed and stable Respiratory status: spontaneous breathing Cardiovascular status: stable Anesthetic complications: no    Last Vitals:  Vitals:   04/19/17 1815 04/19/17 1816  BP: (!) 148/92 (!) 148/92  Pulse: 75 73  Resp: 19 18  Temp:    SpO2: 92% 93%    Last Pain:  Vitals:   04/19/17 1816  TempSrc:   PainSc: 0-No pain                 Nolon Nations

## 2017-04-19 NOTE — Anesthesia Preprocedure Evaluation (Addendum)
Anesthesia Evaluation  Patient identified by MRN, date of birth, ID band Patient awake    Reviewed: Allergy & Precautions, NPO status , Patient's Chart, lab work & pertinent test results  Airway Mallampati: II  TM Distance: >3 FB Neck ROM: Full    Dental no notable dental hx.    Pulmonary sleep apnea and Continuous Positive Airway Pressure Ventilation , former smoker,    Pulmonary exam normal breath sounds clear to auscultation       Cardiovascular hypertension, + CAD and + Cardiac Stents  Normal cardiovascular exam Rhythm:Regular Rate:Normal     Neuro/Psych negative neurological ROS  negative psych ROS   GI/Hepatic negative GI ROS, Neg liver ROS,   Endo/Other  diabetes, Insulin Dependent  Renal/GU negative Renal ROS  negative genitourinary   Musculoskeletal negative musculoskeletal ROS (+)   Abdominal   Peds negative pediatric ROS (+)  Hematology negative hematology ROS (+)   Anesthesia Other Findings   Reproductive/Obstetrics negative OB ROS                             Anesthesia Physical Anesthesia Plan  ASA: III  Anesthesia Plan: General   Post-op Pain Management:  Regional for Post-op pain   Induction: Intravenous  PONV Risk Score and Plan: 3 and Ondansetron and Treatment may vary due to age or medical condition  Airway Management Planned: LMA  Additional Equipment:   Intra-op Plan:   Post-operative Plan: Extubation in OR  Informed Consent: I have reviewed the patients History and Physical, chart, labs and discussed the procedure including the risks, benefits and alternatives for the proposed anesthesia with the patient or authorized representative who has indicated his/her understanding and acceptance.   Dental advisory given  Plan Discussed with: CRNA  Anesthesia Plan Comments:        Anesthesia Quick Evaluation

## 2017-04-19 NOTE — Discharge Instructions (Signed)
No ice, and elevate right foot on 2 pillows at all times.    Post Anesthesia Home Care Instructions  Activity: Get plenty of rest for the remainder of the day. A responsible individual must stay with you for 24 hours following the procedure.  For the next 24 hours, DO NOT: -Drive a car -Paediatric nurse -Drink alcoholic beverages -Take any medication unless instructed by your physician -Make any legal decisions or sign important papers.  Meals: Start with liquid foods such as gelatin or soup. Progress to regular foods as tolerated. Avoid greasy, spicy, heavy foods. If nausea and/or vomiting occur, drink only clear liquids until the nausea and/or vomiting subsides. Call your physician if vomiting continues.  Special Instructions/Symptoms: Your throat may feel dry or sore from the anesthesia or the breathing tube placed in your throat during surgery. If this causes discomfort, gargle with warm salt water. The discomfort should disappear within 24 hours.  If you had a scopolamine patch placed behind your ear for the management of post- operative nausea and/or vomiting:  1. The medication in the patch is effective for 72 hours, after which it should be removed.  Wrap patch in a tissue and discard in the trash. Wash hands thoroughly with soap and water. 2. You may remove the patch earlier than 72 hours if you experience unpleasant side effects which may include dry mouth, dizziness or visual disturbances. 3. Avoid touching the patch. Wash your hands with soap and water after contact with the patch.

## 2017-04-19 NOTE — Anesthesia Procedure Notes (Signed)
Procedure Name: LMA Insertion Date/Time: 04/19/2017 12:12 PM Performed by: Suan Halter, CRNA Pre-anesthesia Checklist: Patient identified, Emergency Drugs available, Suction available and Patient being monitored Patient Re-evaluated:Patient Re-evaluated prior to induction Oxygen Delivery Method: Circle system utilized Preoxygenation: Pre-oxygenation with 100% oxygen Induction Type: IV induction Ventilation: Mask ventilation without difficulty LMA: LMA inserted LMA Size: 4.0 Number of attempts: 1 Airway Equipment and Method: Bite block Placement Confirmation: positive ETCO2 Tube secured with: Tape Dental Injury: Teeth and Oropharynx as per pre-operative assessment

## 2017-04-19 NOTE — Anesthesia Procedure Notes (Signed)
Procedures

## 2017-04-24 ENCOUNTER — Encounter (HOSPITAL_BASED_OUTPATIENT_CLINIC_OR_DEPARTMENT_OTHER): Payer: Self-pay | Admitting: Podiatry

## 2017-04-29 NOTE — Op Note (Signed)
NAMEARRIE, BORRELLI                  ACCOUNT NO.:  [id number]  MEDICAL RECORD NO.:  [id number]  LOCATION:                                 FACILITY:  PHYSICIAN:  Twanna Hy. Maclaine Ahola, D.P.M.DATE OF BIRTH:  Jan 11, 1951  DATE OF PROCEDURE:  04/19/2017 DATE OF DISCHARGE:                              OPERATIVE REPORT   SURGEON:  Twanna Hy. Aidon Klemens, D.P.M.  ASSISTANT:  None.  PREOPERATIVE DIAGNOSES: 1. Deformity of 1st metatarsal 1st cuneiform, right foot. 2. Bunion deformity with 1st metatarsal elevatus, right foot. 3. Hallux deformity, right foot. 4. Plantarflexed and elongated 2nd metatarsal, right foot. 5. 2nd hammertoe, right foot, with predislocation syndrome.  POSTOPERATIVE DIAGNOSES: 1. Deformity of 1st metatarsal 1st cuneiform, right foot. 2. Bunion deformity with 1st metatarsal elevatus, right foot. 3. Hallux deformity, right foot. 4. Plantarflexed and elongated 2nd metatarsal, right foot. 5. 2nd hammertoe, right foot, with predislocation syndrome.  PROCEDURES: 1. 1st metatarsal 1st cuneiform fusion with internal fixation, right     foot. 2. McBride bunion correction and Akin osteotomy bunion correction,     right foot. 3. 2nd metatarsal osteotomy with internal fixation. 4. 2nd hammertoe repair, right foot.  ANESTHESIA:  LMA.  COMPLICATIONS:  None.  DESCRIPTION OF PROCEDURE:  The patient was brought to the OR and placed in a supine position, at which time a general anesthesia was administered.  The patient was prepped and draped in the usual aseptic manner, and then, the previously applied tourniquet was inflated to 250 mmHg.  First, attention was directed to the 1st ray, where a dorsal linear incision was made over the 1st metatarsocuneiform joint. Dissection was carried to the level of the 1st metatarsocuneiform joint. A great care was taken to identify the joint and protect all neurovascular structures encountered.  A linear capsulotomy was performed and the capsule  and periosteum freed of the base of the metatarsal and medial cuneiform.  Once there was good exposure and soft tissue was freed from this area, a Lapidus Cut and Guide System from [employer] was used.  A 16-degree metatarsocuneiform guide was selected, and this was inserted into the area of the joint level.  The orientation of the cut and guide was checked using a 1.6 cm double blunt K-wire into the dorsal hole and the cut guide.  This K-wire was aligned with the long axis of the tibia to yield a 45-degree dorsomedial position of the cut guide, and this was confirmed using intraoperative radiograph.  Cut guide placement was continued by placing a 1.6 mm K-wire into the dorsal cut hole in the medial cuneiform and then another into the one to the metatarsal.  Once the metatarsocuneiform cut guide was in the proper position, the blade and the set were used to make the 1st metatarsal cut inserting saw blade in the metatarsal slot closest to the 1st tarsometatarsal joint.  Using the same saw blade, this was inserted into the medial cuneiform slot closest to the 1st tarsometatarsal joint. Then, the 1.6 mm K-wires were removed from the cut guide as well as the cut guide itself, and then, the pieces of bone were removed in their entirety removing those cut surfaces  using hemostats and sharp dissection, taken care not to disrupt any structures.  Next, the integrity of the cartilage was evaluated and it was decided to use bone curette at the metatarsal head, and then both sizes of the cut were fenestrated.  Temporary fixation was then used and placement was confirmed using intraoperative radiograph.  This was found to be excellent reduction of the deformity, and after the joint was prepared properly using the [employer] Lapidus plate and interlocking screws, fixation was accomplished.  First, using the [place] Lapidus Arthrodesis System using a precision guide, a lag screw was placed to cross the joint  and avoid the other locking and nonlocking screws in the construct.  This was found to be excellent placement and it was confirmed with intraoperative radiographs.  The Lapidus plate was secured with olive wires in each of the appropriate areas or placement of screws.  Appropriate-sized screws were placed into the plate.  This was found to be excellent fixation of both the metatarsal and the medial cuneiform and appropriate screws selected.  Final placement of screws and plate were confirmed using intraoperative radiograph.  The area was then irrigated with copious amounts of sterile saline and antibiotic solution.  Deep closure accomplished with 3-0 and 4-0 Vicryl and skin closure with 4-0 Monocryl.  At this point, the tourniquet was deflated and allowed for revascularization over 20 minutes.  The incision was then extended over the 1st metatarsal and proximal phalanx.  Careful dissection was made not to disrupt any neurovascular structures.  Next, an inverted L-capsulotomy was performed and the capsule and periosteum freed from the head and the metatarsal and base of the proximal phalanx. Once exposed, the medial eminence was resected parallel with the shaft and ORIF edges were smoothed with a power rasp.  Dissection was continued into the 1st interspace freeing the lateral sesamoid.  This was found to be adequate reduction at the level of the head.  Dissection was carried at the level of the proximal phalanx, where the periosteum and capsule were freed, and the decision was made to do an osteotomy at the proximal phalanx with great care to leave the lateral cortex intact. Wedge of bone was resected and this was reduced.  The area irrigated, and then fixating using [employer]'s 10 mm nitinol staple as part of the [place].  This was found to be excellent fixation. Area was irrigated with copious amounts of sterile saline and antibiotic solution.  Deep closure was  accomplished after a capsulorrhaphy was performed at the 1st MTP joint with 3-0 Vicryl, 4-0 Vicryl, and the remaining of the incision with 4-0 Monocryl.  Next, the attention was directed to the 2nd toe, where a dorsal linear incision was made.  The incision was deepened via sharp and blunt modalities taken care to clamp and cauterize all bleeding vessels and ensuring retraction of neurovascular structures encountered.  The deep and superficial fascias were separated medially and laterally along the length of the incision. At the level of the proximal phalangeal joint, the extensor tendon was transected and the soft tissue freed from the head of the proximal phalanx and base of the medial phalanx.  Once exposed, the head of the proximal phalanx was resected as well as the very base of the medial phalanx.  The area was irrigated with copious amounts of sterile saline and antibiotic solution.  This was found to be inadequate reduction at the 2nd MTP joint; and therefore, the incision was extended over the 2nd metatarsophalangeal  joint in a curvilinear fashion.  The incision was deepened via sharp and blunt modalities, taken care to clamp and cauterize all bleeding vessels and ensuring protection of neurovascular structures encountered.  This was protected and then dissection carried at the 2nd metatarsophalangeal joint with the extensor tendons protected, and then, a linear capsulotomy was performed at the head of the 2nd metatarsal.  Capsule and periosteum freed from the 2nd metatarsal.  Then, a Weil 2nd metatarsal osteotomy was performed.  This allowed to slightly shorten and reposition the head of the 2nd metatarsal.  The overhang of bone was resected with a rongeur.  This osteotomy was fixated with a 2.5 mm cannulated screw from [employer] in the usual AO fashion.  This was found to be excellent fixation.  The area was irrigated with copious amounts of sterile saline and  antibiotic solution.  Then, attention was re-directed back at the proximal phalanx, where decision was made to stabilize the digit with a 0.045 K-wire.  The area was irrigated.  All procedures were confirmed with intraoperative radiographs.  Deep closure was accomplished with 3-0 and 4-0 Vicryls. Skin closure with 4-0 and 5-0 nylon.  The area was dressed with Xeroform, 4x4s, Kling, and Coban.  The tourniquet was deflated and vascular status returned to all digits.  The patient was sent to the recovery room with vital signs stable and capillary refill time at presurgical levels.  Both written and oral postoperative instructions were given to the patient.  No guarantees given.    ______________________________ Twanna Hy. Halen Antenucci, D.P.M.   ______________________________ Twanna Hy. Fritzi Mandes, D.P.M.    MJA/MEDQ  D:  04/27/2017  T:  04/27/2017  Job:  [id number]

## 2017-05-09 ENCOUNTER — Other Ambulatory Visit: Payer: Self-pay | Admitting: Family

## 2017-05-16 ENCOUNTER — Other Ambulatory Visit (INDEPENDENT_AMBULATORY_CARE_PROVIDER_SITE_OTHER): Payer: Self-pay | Admitting: Physician Assistant

## 2017-05-16 DIAGNOSIS — E119 Type 2 diabetes mellitus without complications: Secondary | ICD-10-CM

## 2017-05-17 ENCOUNTER — Telehealth: Payer: Self-pay | Admitting: Family

## 2017-05-17 NOTE — Telephone Encounter (Unsigned)
Copied from [place] [id number]. Topic: Quick Communication - Rx Refill/Question >> May 17, 2017 10:13 AM Neva Seat wrote: Has the patient contacted their pharmacy? yes Preferred Pharmacy is [facility] in [place]   Pt's welless dr for [employer] loss - Dr. Leafy Ro office is closed today.   Pt is asking if Dr. Inda Castle can refill her Victoza since other dr is out of office.

## 2017-05-21 ENCOUNTER — Other Ambulatory Visit (INDEPENDENT_AMBULATORY_CARE_PROVIDER_SITE_OTHER): Payer: Self-pay | Admitting: Physician Assistant

## 2017-05-21 DIAGNOSIS — E119 Type 2 diabetes mellitus without complications: Secondary | ICD-10-CM

## 2017-05-22 ENCOUNTER — Other Ambulatory Visit (INDEPENDENT_AMBULATORY_CARE_PROVIDER_SITE_OTHER): Payer: Self-pay

## 2017-05-22 DIAGNOSIS — E119 Type 2 diabetes mellitus without complications: Secondary | ICD-10-CM

## 2017-05-22 MED ORDER — LIRAGLUTIDE 18 MG/3ML ~~LOC~~ SOPN: 1.8000 mg | PEN_INJECTOR | Freq: Every day | SUBCUTANEOUS | 0 refills | Status: DC

## 2017-05-22 MED ORDER — GLUCOSE BLOOD VI STRP
1.0000 | ORAL_STRIP | Freq: Three times a day (TID) | 0 refills | Status: DC
Start: 2017-05-22 — End: 2018-04-10

## 2017-05-28 ENCOUNTER — Other Ambulatory Visit (INDEPENDENT_AMBULATORY_CARE_PROVIDER_SITE_OTHER): Payer: Self-pay | Admitting: Family Medicine

## 2017-05-28 DIAGNOSIS — E119 Type 2 diabetes mellitus without complications: Secondary | ICD-10-CM

## 2017-05-28 MED ORDER — INSULIN PEN NEEDLE 32G X 4 MM MISC: 0 refills | Status: DC

## 2017-05-29 ENCOUNTER — Ambulatory Visit (INDEPENDENT_AMBULATORY_CARE_PROVIDER_SITE_OTHER): Payer: 59 | Admitting: Physician Assistant

## 2017-06-17 ENCOUNTER — Ambulatory Visit (INDEPENDENT_AMBULATORY_CARE_PROVIDER_SITE_OTHER): Payer: 59 | Admitting: Physician Assistant

## 2017-06-20 ENCOUNTER — Other Ambulatory Visit (INDEPENDENT_AMBULATORY_CARE_PROVIDER_SITE_OTHER): Payer: Self-pay | Admitting: Physician Assistant

## 2017-06-20 DIAGNOSIS — E119 Type 2 diabetes mellitus without complications: Secondary | ICD-10-CM

## 2017-06-26 ENCOUNTER — Encounter: Payer: Self-pay | Admitting: Family

## 2017-06-26 ENCOUNTER — Ambulatory Visit: Payer: 59 | Admitting: Family

## 2017-06-26 VITALS — BP 142/72 | HR 59 | Temp 98.3°F | Resp 16 | Ht 63.0 in | Wt 196.0 lb

## 2017-06-26 DIAGNOSIS — E559 Vitamin D deficiency, unspecified: Secondary | ICD-10-CM

## 2017-06-26 DIAGNOSIS — E669 Obesity, unspecified: Secondary | ICD-10-CM

## 2017-06-26 DIAGNOSIS — E119 Type 2 diabetes mellitus without complications: Secondary | ICD-10-CM | POA: Diagnosis not present

## 2017-06-26 DIAGNOSIS — I10 Essential (primary) hypertension: Secondary | ICD-10-CM | POA: Diagnosis not present

## 2017-06-26 LAB — BASIC METABOLIC PANEL
BUN: 14 mg/dL (ref 6–23)
CO2: 28 mEq/L (ref 19–32)
Calcium: 9.2 mg/dL (ref 8.4–10.5)
Chloride: 105 mEq/L (ref 96–112)
Creatinine, Ser: 0.72 mg/dL (ref 0.40–1.20)
GFR: 85.76 mL/min (ref >60.00)
Glucose, Bld: 116 mg/dL — ABNORMAL HIGH (ref 70–99)
Potassium: 3.9 mEq/L (ref 3.5–5.1)
Sodium: 141 mEq/L (ref 135–145)

## 2017-06-26 LAB — HEMOGLOBIN A1C: Hgb A1c MFr Bld: 5.9 % (ref 4.6–6.5)

## 2017-06-26 MED ORDER — INSULIN PEN NEEDLE 32G X 4 MM MISC: 0 refills | Status: DC

## 2017-06-26 MED ORDER — LIRAGLUTIDE 18 MG/3ML ~~LOC~~ SOPN: PEN_INJECTOR | SUBCUTANEOUS | 5 refills | Status: DC

## 2017-06-26 MED ORDER — METFORMIN HCL ER 750 MG PO TB24: 750.0000 mg | ORAL_TABLET | Freq: Two times a day (BID) | ORAL | 1 refills | Status: DC

## 2017-06-26 MED ORDER — VITAMIN D3 75 MCG (3000 UT) PO TABS: 1.0000 | ORAL_TABLET | Freq: Every day | ORAL | Status: AC

## 2017-06-26 NOTE — Progress Notes (Signed)
Subjective:    Patient ID: [name] [name], female    DOB: 12/09/1950, 67 y.o.   MRN: [id number]  HPI  Pt presents today for follow up.  1) HTN-current medications include metoprolol 50 mg twice daily, Benicar HCT 20-12.5 mg. Reports + foot pain and had bunionectomy 2018.    BP Readings from Last 3 Encounters:  06/26/17 (!) 155/65  04/19/17 (!) 148/92  04/17/17 (!) 144/83   2) DM2-she is maintained on Victoza, and metformin. Lab Results  Component Value Date   HGBA1C 6.1 03/27/2017   HGBA1C 6.3 (H) 12/20/2016   HGBA1C 7.1 (H) 09/05/2016   Lab Results  Component Value Date   MICROALBUR 1.9 05/23/2016   LDLCALC 51 09/05/2016   CREATININE 0.70 04/19/2017   3) Hyperlipidemia-she is maintained on Crestor 20 mg once daily. Lab Results  Component Value Date   CHOL 132 09/05/2016   HDL 53 09/05/2016   LDLCALC 51 09/05/2016   LDLDIRECT 77.7 12/27/2009   TRIG 139 09/05/2016   CHOLHDL 2 05/23/2016   4) vitamin D deficiency-maintained on vitamin D supplement which is being managed by the weight loss clinic.  5) Obesity-continues to follow with weight loss clinic. Wt Readings from Last 3 Encounters:  06/26/17 196 lb (88.9 kg)  04/19/17 193 lb (87.5 kg)  04/17/17 192 lb (87.1 kg)   6) Depression-she is followed by psychiatry.  She is currently maintained on Wellbutrin.    Review of Systems See HPI  Past Medical History:  Diagnosis Date  . Acquired deformity of right foot   . CAD (coronary artery disease) primary cardioloigst-  dr Stanford Breed   hx NSTEMI 10-24-2001  s/p  cardiac cath w/ PCI and DES x1 to first diagonal  . Chronic constipation   . Depression   . Fatty liver 04/26/2015  . GERD (gastroesophageal reflux disease)   . History of abnormal cervical Pap smear    ACUS 2012  . History of exercise stress test 05-25-2014  dr Stanford Breed   borderline (indeterminate) with non-specific ST changes (mild ST depression in leads II,III, aVF, & V6, did not meet criteria for  ischemia) ;  pt had low intolerence, no cp, normal heartrate and bp response, no arrhythmias  . History of goiter    as child s/p  removal ,  per pt benign , neck area  (not thyroid)  . History of non-ST elevation myocardial infarction (NSTEMI) 10/24/2001   s/p  PCI and DES to first diagonal  . Hyperlipidemia   . Hypertension   . OSA on CPAP pulmoloigst-  dr young   per study 11-07-2004  severe osa  . Right foot pain   . S/P drug eluting coronary stent placement 10/27/2001   x1 to first diagonal   . Seasonal and perennial allergic rhinitis   . Type 2 diabetes mellitus ([place])   . Vitamin D deficiency   . Wears dentures    upper and lower partial denture  . Wears glasses      Social History   Socioeconomic History  . Marital status: Married    Spouse name: Not on file  . Number of children: 3  . Years of education: Not on file  . Highest education level: Not on file  Social Needs  . Financial resource strain: Not on file  . Food insecurity - worry: Not on file  . Food insecurity - inability: Not on file  . Transportation needs - medical: Not on file  . Transportation needs - non-medical:  Not on file  Occupational History  . 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 Smoking status: Former Smoker    Years: 46.00    Types: Cigarettes    Last attempt to quit: 09/23/2012    Years since quitting: 4.7  . Smokeless tobacco: Never Used  Substance and Sexual Activity  . Alcohol use: Yes    Alcohol/week: 0.0 oz    Comment: occasional  . Drug use: No  . Sexual activity: Not on file  Other Topics Concern  . Not on file  Social History Narrative   Caffeine Use:  2 cups coffee daily   Regular exercise:  No       Past Surgical History:  Procedure Laterality Date  . BUNIONECTOMY Left 2012  . BUNIONECTOMY Right 04/19/2017   Procedure: RIGHT BUNIONECTOMY;  Surgeon: Rosemary Holms, DPM;  Location:  [facility];  Service: Podiatry;  Laterality: Right;  . CARDIAC CATHETERIZATION  09-11-2002   dr Lia Foyer   well-preserved LVF; continued patency previouly placed stent in diagonal branch;  mild luminal irregularities   . CARDIOVASCULAR STRESS TEST  05-16-2011   dr Stanford Breed   normal nuclear study w/ no evidence ischemia/  normal LV function and wall motion , ef 77%  . CORONARY ANGIOPLASTY WITH STENT PLACEMENT  10-27-2001   dr Lia Foyer   high-grade stenosis first diagonal post PCI and DES (TAXUS);  mild luminal irregularity throughout LAD, RCA, and very mild CFx;  preserved LVF  . goiter removal  1968   per pt benign tumor removed from neck beside throat  . HALLUX VALGUS LAPIDUS Right 04/19/2017   Procedure: HALLUX VALGUS LAPIDUS FUSION;  Surgeon: Rosemary Holms, DPM;  Location: [place];  Service: Podiatry;  Laterality: Right;  . HAMMER TOE SURGERY Right 04/19/2017   Procedure: HAMMER TOE CORRECTION RIGHT 2ND;  Surgeon: Rosemary Holms, DPM;  Location: [facility];  Service: Podiatry;  Laterality: Right;  . METATARSAL OSTEOTOMY Right 04/19/2017   Procedure: 2ND METATARSAL OSTEOTOMY;  Surgeon: Rosemary Holms, DPM;  Location: [place];  Service: Podiatry;  Laterality: Right;  . TONSILLECTOMY AND ADENOIDECTOMY  age 70  . TUBAL LIGATION Bilateral 1985    Family History  Problem Relation Age of Onset  . Cancer Mother        throat, cervical  . Diabetes Mother   . Stroke Mother   . Heart attack Mother   . Heart disease Mother   . Hypertension Mother   . Thyroid disease Mother   . Alcoholism Mother   . Cancer Father        prostate  . Diabetes Sister   . Heart attack Sister   . Hypertension Sister   . Hyperlipidemia Sister   . Colon cancer Neg Hx   . Breast cancer Neg Hx     No Known Allergies  Current Outpatient Medications on File Prior to Visit  Medication Sig Dispense Refill  . aspirin 81 MG tablet Take 81 mg by  mouth daily.      Marland Kitchen buPROPion (WELLBUTRIN XL) 150 MG 24 hr tablet Take 450 mg every morning by mouth.     . Calcium Carbonate-Vitamin D (CALTRATE 600+D) 600-400 MG-UNIT per tablet Take 2 tablets 2 (two) times daily by mouth.     Marland Kitchen glucose blood (ONE TOUCH ULTRA TEST) test strip 1 each by Other route 3 (three) times daily. for  testing 100 each 0  . Insulin Pen Needle (BD PEN NEEDLE NANO U/F) 32G X 4 MM MISC USE ONCE DAILY WITH INSULIN INJECTION 100 each 0  . Lancets (ONETOUCH ULTRASOFT) lancets TEST BLOOD SUGAR ONCE DAILY 100 each 0  . metFORMIN (GLUCOPHAGE-XR) 750 MG 24 hr tablet take 1 tablet by mouth twice a day with food 60 tablet 0  . metoprolol tartrate (LOPRESSOR) 50 MG tablet Take 1 tablet (50 mg total) by mouth 2 (two) times daily. (Patient taking differently: Take 50 mg 2 (two) times daily by mouth. ) 60 tablet 5  . Multiple Vitamins-Minerals (MULTIVITAMIN ADULTS 50+ PO) Take 1 tablet every morning by mouth.     . naltrexone (DEPADE) 50 MG tablet Take 25 mg 2 (two) times daily by mouth.   0  . olmesartan-hydrochlorothiazide (BENICAR HCT) 20-12.5 MG tablet Take 1 tablet by mouth daily. (Patient taking differently: Take 1 tablet every morning by mouth. ) 90 tablet 1  . pantoprazole (PROTONIX) 40 MG tablet Take 1 tablet (40 mg total) by mouth daily. (Patient taking differently: Take 40 mg every morning by mouth. ) 30 tablet 5  . Probiotic Product (PROBIOTIC DAILY PO) Take 1 capsule every morning by mouth.     . rosuvastatin (CRESTOR) 20 MG tablet take 1 tablet by mouth once daily 30 tablet 5  . VICTOZA 18 MG/3ML SOPN inject 1.8 milligram subcutaneously daily 9 mL 0  . Vitamin D, Ergocalciferol, (DRISDOL) 50000 units CAPS capsule Take 1 capsule (50,000 Units total) every 7 (seven) days by mouth. 4 capsule 0  . VRAYLAR capsule   0   No current facility-administered medications on file prior to visit.     BP (!) 155/65 (BP Location: Right Arm, Patient Position: Sitting, Cuff Size: Large)    Pulse (!) 59   Temp 98.3 F (36.8 C) (Oral)   Resp 16   Ht 5\' 3"  (1.6 m)   Wt 196 lb (88.9 kg)   SpO2 100%   BMI 34.72 kg/m       Objective:   Physical Exam  Constitutional: She is oriented to person, place, and time. She appears well-developed and well-nourished.  HENT:  Head: Normocephalic and atraumatic.  Cardiovascular: Normal rate, regular rhythm and normal heart sounds.  No murmur heard. Pulmonary/Chest: Effort normal and breath sounds normal. No respiratory distress. She has no wheezes.  Musculoskeletal: She exhibits no edema.  Neurological: She is alert and oriented to person, place, and time.  Skin: Skin is warm and dry.  Psychiatric: She has a normal mood and affect. Her behavior is normal. Judgment and thought content normal.          Assessment & Plan:  HTN- initial BP was elevated, follow up BP is improved today.   BP Readings from Last 3 Encounters:  06/26/17 (!) 142/72  04/19/17 (!) 148/92  04/17/17 (!) 144/83   Vit D deficiency-  D/c weekly vit D, check level, start otc vit D 3000iu daily.  DM2- stable on current meds, continues same.  Obesity- she does not plan to return to the weight loss clinic "because it is not helping."

## 2017-06-26 NOTE — Patient Instructions (Signed)
Please complete lab work prior to leaving.   

## 2017-06-29 LAB — VITAMIN D 1,25 DIHYDROXY
Vitamin D 1, 25 (OH)2 Total: 48 pg/mL (ref 18–72)
Vitamin D2 1, 25 (OH)2: 26 pg/mL
Vitamin D3 1, 25 (OH)2: 22 pg/mL

## 2017-07-01 ENCOUNTER — Encounter: Payer: Self-pay | Admitting: Family

## 2017-07-01 ENCOUNTER — Encounter (INDEPENDENT_AMBULATORY_CARE_PROVIDER_SITE_OTHER): Payer: Self-pay

## 2017-07-01 ENCOUNTER — Ambulatory Visit (INDEPENDENT_AMBULATORY_CARE_PROVIDER_SITE_OTHER): Payer: 59 | Admitting: Physician Assistant

## 2017-08-06 NOTE — Progress Notes (Signed)
HPI: FU coronary disease. She has had a prior PCI of her diagonal. Her most recent catheterization in April of 2004 showed no obstructive disease, and her ejection fraction was normal. Her most recent Myoview was performed in Dec 2012. EF 77 and normal perfusion. ETT 12/15 showed borderline changes in inferior leads. Since I last saw her, the patient has dyspnea with more extreme activities but not with routine activities. It is relieved with rest. It is not associated with chest pain. There is no orthopnea, PND or pedal edema. There is no syncope or palpitations. There is no exertional chest pain.   Current Outpatient Medications  Medication Sig Dispense Refill  . aspirin 81 MG tablet Take 81 mg by mouth daily.      Marland Kitchen buPROPion (WELLBUTRIN XL) 150 MG 24 hr tablet Take 450 mg every morning by mouth.     . Calcium Carbonate-Vitamin D (CALTRATE 600+D) 600-400 MG-UNIT per tablet Take 2 tablets 2 (two) times daily by mouth.     . Cholecalciferol (VITAMIN D3) 3000 units TABS Take 1 tablet by mouth daily. 30 tablet   . glucose blood (ONE TOUCH ULTRA TEST) test strip 1 each by Other route 3 (three) times daily. for testing 100 each 0  . Insulin Pen Needle (BD PEN NEEDLE NANO U/F) 32G X 4 MM MISC USE ONCE DAILY WITH INSULIN INJECTION 100 each 0  . Lancets (ONETOUCH ULTRASOFT) lancets TEST BLOOD SUGAR ONCE DAILY 100 each 0  . liraglutide (VICTOZA) 18 MG/3ML SOPN inject 1.8 milligram subcutaneously daily 9 mL 5  . metFORMIN (GLUCOPHAGE-XR) 750 MG 24 hr tablet Take 1 tablet (750 mg total) by mouth 2 (two) times daily with a meal. 90 tablet 1  . metoprolol tartrate (LOPRESSOR) 50 MG tablet Take 1 tablet (50 mg total) by mouth 2 (two) times daily. (Patient taking differently: Take 50 mg 2 (two) times daily by mouth. ) 60 tablet 5  . Multiple Vitamins-Minerals (MULTIVITAMIN ADULTS 50+ PO) Take 1 tablet every morning by mouth.     . naltrexone (DEPADE) 50 MG tablet Take 25 mg 2 (two) times daily by mouth.    0  . olmesartan-hydrochlorothiazide (BENICAR HCT) 20-12.5 MG tablet Take 1 tablet by mouth daily. (Patient taking differently: Take 1 tablet every morning by mouth. ) 90 tablet 1  . pantoprazole (PROTONIX) 40 MG tablet Take 1 tablet (40 mg total) by mouth daily. (Patient taking differently: Take 40 mg every morning by mouth. ) 30 tablet 5  . Probiotic Product (PROBIOTIC DAILY PO) Take 1 capsule every morning by mouth.     . rosuvastatin (CRESTOR) 20 MG tablet take 1 tablet by mouth once daily 30 tablet 5  . VRAYLAR capsule   0   No current facility-administered medications for this visit.      Past Medical History:  Diagnosis Date  . Acquired deformity of right foot   . CAD (coronary artery disease) primary cardioloigst-  dr Stanford Breed   hx NSTEMI 10-24-2001  s/p  cardiac cath w/ PCI and DES x1 to first diagonal  . Chronic constipation   . Depression   . Fatty liver 04/26/2015  . GERD (gastroesophageal reflux disease)   . History of abnormal cervical Pap smear    ACUS 2012  . History of exercise stress test 05-25-2014  dr Stanford Breed   borderline (indeterminate) with non-specific ST changes (mild ST depression in leads II,III, aVF, & V6, did not meet criteria for ischemia) ;  pt had low  intolerence, no cp, normal heartrate and bp response, no arrhythmias  . History of goiter    as child s/p  removal ,  per pt benign , neck area  (not thyroid)  . History of non-ST elevation myocardial infarction (NSTEMI) 10/24/2001   s/p  PCI and DES to first diagonal  . Hyperlipidemia   . Hypertension   . OSA on CPAP pulmoloigst-  dr young   per study 11-07-2004  severe osa  . Right foot pain   . S/P drug eluting coronary stent placement 10/27/2001   x1 to first diagonal   . Seasonal and perennial allergic rhinitis   . Type 2 diabetes mellitus ([place])   . Vitamin D deficiency   . Wears dentures    upper and lower partial denture  . Wears glasses     Past Surgical History:  Procedure Laterality  Date  . BUNIONECTOMY Left 2012  . BUNIONECTOMY Right 04/19/2017   Procedure: RIGHT BUNIONECTOMY;  Surgeon: Rosemary Holms, DPM;  Location: [facility];  Service: Podiatry;  Laterality: Right;  . CARDIAC CATHETERIZATION  09-11-2002   dr Lia Foyer   well-preserved LVF; continued patency previouly placed stent in diagonal branch;  mild luminal irregularities   . CARDIOVASCULAR STRESS TEST  05-16-2011   dr Stanford Breed   normal nuclear study w/ no evidence ischemia/  normal LV function and wall motion , ef 77%  . CORONARY ANGIOPLASTY WITH STENT PLACEMENT  10-27-2001   dr Lia Foyer   high-grade stenosis first diagonal post PCI and DES (TAXUS);  mild luminal irregularity throughout LAD, RCA, and very mild CFx;  preserved LVF  . goiter removal  1968   per pt benign tumor removed from neck beside throat  . HALLUX VALGUS LAPIDUS Right 04/19/2017   Procedure: HALLUX VALGUS LAPIDUS FUSION;  Surgeon: Rosemary Holms, DPM;  Location: [place];  Service: Podiatry;  Laterality: Right;  . HAMMER TOE SURGERY Right 04/19/2017   Procedure: HAMMER TOE CORRECTION RIGHT 2ND;  Surgeon: Rosemary Holms, DPM;  Location: [facility];  Service: Podiatry;  Laterality: Right;  . METATARSAL OSTEOTOMY Right 04/19/2017   Procedure: 2ND METATARSAL OSTEOTOMY;  Surgeon: Rosemary Holms, DPM;  Location: [place];  Service: Podiatry;  Laterality: Right;  . TONSILLECTOMY AND ADENOIDECTOMY  age 34  . TUBAL LIGATION Bilateral 1985    Social History   Socioeconomic History  . Marital status: Married    Spouse name: Not on file  . Number of children: 3  . Years of education: Not on file  . Highest education level: Not on file  Social Needs  . Financial resource strain: Not on file  . Food insecurity - worry: Not on file  . Food insecurity - inability: Not on file  . Transportation needs - medical: Not on file  . Transportation needs - non-medical: Not on file    Occupational History  . 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 Smoking status: Former Smoker    Years: 46.00    Types: Cigarettes    Last attempt to quit: 09/23/2012    Years since quitting: 4.8  . Smokeless tobacco: Never Used  Substance and Sexual Activity  . Alcohol use: Yes    Alcohol/week: 0.0 oz    Comment: occasional  . Drug use: No  . Sexual activity: Not on file  Other Topics Concern  . Not on file  Social History Narrative   Caffeine Use:  2 cups coffee daily   Regular exercise:  No       Family History  Problem Relation Age of Onset  . Cancer Mother        throat, cervical  . Diabetes Mother   . Stroke Mother   . Heart attack Mother   . Heart disease Mother   . Hypertension Mother   . Thyroid disease Mother   . Alcoholism Mother   . Cancer Father        prostate  . Diabetes Sister   . Heart attack Sister   . Hypertension Sister   . Hyperlipidemia Sister   . Colon cancer Neg Hx   . Breast cancer Neg Hx     ROS: no fevers or chills, productive cough, hemoptysis, dysphasia, odynophagia, melena, hematochezia, dysuria, hematuria, rash, seizure activity, orthopnea, PND, pedal edema, claudication. Remaining systems are negative.  Physical Exam: Well-developed obese in no acute distress.  Skin is warm and dry.  HEENT is normal.  Neck is supple.  Chest is clear to auscultation with normal expansion.  Cardiovascular exam is regular rate and rhythm.  Abdominal exam nontender or distended. No masses palpated. Extremities show no edema. neuro grossly intact  ECG-normal sinus rhythm at a rate of 69.  No ST changes.  Personally reviewed  A/P  1 coronary artery disease-patient doing well from a symptomatic standpoint.  No chest pain.  Continue medical therapy including aspirin and statin.  Continue lifestyle modification.  2 hypertension-blood pressure is elevated.  Increase  benicar to 40 mg daily; bmet one week.  3 hyperlipidemia-continue statin.  Check lipids and liver.   Kirk Ruths, MD

## 2017-08-14 ENCOUNTER — Encounter: Payer: Self-pay | Admitting: Cardiology

## 2017-08-14 ENCOUNTER — Ambulatory Visit: Payer: 59 | Admitting: Cardiology

## 2017-08-14 VITALS — BP 144/72 | HR 69 | Ht 63.0 in | Wt 195.6 lb

## 2017-08-14 DIAGNOSIS — E7800 Pure hypercholesterolemia, unspecified: Secondary | ICD-10-CM | POA: Diagnosis not present

## 2017-08-14 DIAGNOSIS — I2510 Atherosclerotic heart disease of native coronary artery without angina pectoris: Secondary | ICD-10-CM | POA: Diagnosis not present

## 2017-08-14 DIAGNOSIS — I10 Essential (primary) hypertension: Secondary | ICD-10-CM | POA: Diagnosis not present

## 2017-08-14 MED ORDER — OLMESARTAN MEDOXOMIL-HCTZ 40-12.5 MG PO TABS: 1.0000 | ORAL_TABLET | Freq: Every day | ORAL | 3 refills | Status: DC

## 2017-08-14 NOTE — Patient Instructions (Signed)
Medication Instructions:   INCREASE BENICAR TO 40/12.5 MG ONCE DAILY  Labwork:  Your physician recommends that you return FOR LAB WORK IN ONE WEEK PRIOR TO EATING  Follow-Up:  Your physician wants you to follow-up in: [place] will receive a reminder letter in the mail two months in advance. If you don't receive a letter, please call our office to schedule the follow-up appointment.   If you need a refill on your cardiac medications before your next appointment, please call your pharmacy.

## 2017-08-15 ENCOUNTER — Telehealth: Payer: 59 | Admitting: Physician Assistant

## 2017-08-15 DIAGNOSIS — R6889 Other general symptoms and signs: Secondary | ICD-10-CM

## 2017-08-15 MED ORDER — OSELTAMIVIR PHOSPHATE 75 MG PO CAPS: 75.0000 mg | ORAL_CAPSULE | Freq: Two times a day (BID) | ORAL | 0 refills | Status: DC

## 2017-08-15 NOTE — Progress Notes (Signed)

## 2017-08-29 LAB — COMPREHENSIVE METABOLIC PANEL
ALT: 33 IU/L — ABNORMAL HIGH (ref 0–32)
AST: 23 IU/L (ref 0–40)
Albumin/Globulin Ratio: 2 (ref 1.2–2.2)
Albumin: 4.3 g/dL (ref 3.6–4.8)
Alkaline Phosphatase: 71 IU/L (ref 39–117)
BUN/Creatinine Ratio: 18 (ref 12–28)
BUN: 13 mg/dL (ref 8–27)
Bilirubin Total: 0.5 mg/dL (ref 0.0–1.2)
CO2: 26 mmol/L (ref 20–29)
Calcium: 9.1 mg/dL (ref 8.7–10.3)
Chloride: 102 mmol/L (ref 96–106)
Creatinine, Ser: 0.72 mg/dL (ref 0.57–1.00)
GFR calc Af Amer: 100 mL/min/{1.73_m2} (ref >59)
GFR calc non Af Amer: 87 mL/min/{1.73_m2} (ref >59)
Globulin, Total: 2.2 g/dL (ref 1.5–4.5)
Glucose: 102 mg/dL — ABNORMAL HIGH (ref 65–99)
Potassium: 3.9 mmol/L (ref 3.5–5.2)
Sodium: 143 mmol/L (ref 134–144)
Total Protein: 6.5 g/dL (ref 6.0–8.5)

## 2017-08-29 LAB — LIPID PANEL
Chol/HDL Ratio: 2.3 ratio (ref 0.0–4.4)
Cholesterol, Total: 115 mg/dL (ref 100–199)
HDL: 50 mg/dL (ref >39)
LDL Calculated: 33 mg/dL (ref 0–99)
Triglycerides: 162 mg/dL — ABNORMAL HIGH (ref 0–149)
VLDL Cholesterol Cal: 32 mg/dL (ref 5–40)

## 2017-09-10 ENCOUNTER — Telehealth: Payer: Self-pay | Admitting: *Deleted

## 2017-09-10 ENCOUNTER — Encounter: Payer: Self-pay | Admitting: Cardiology

## 2017-09-10 MED ORDER — AMLODIPINE BESYLATE 5 MG PO TABS: 5.0000 mg | ORAL_TABLET | Freq: Every day | ORAL | 3 refills | Status: DC

## 2017-09-10 NOTE — Telephone Encounter (Signed)
Left detailed message for the patient of dr Jacalyn Lefevre recommendations. New script sent to the pharmacy

## 2017-09-10 NOTE — Telephone Encounter (Signed)
Add amlodipine 5 mg daily and follow BP Brian Crenshaw  

## 2017-09-10 NOTE — Telephone Encounter (Signed)
Spoke with pt, for the last week she has had a dull headache and nausea that gets worse during the day. She reports her bp running 160-140/70-60. At her office visit 08-14-17 her benicar was increased to 40/12.5.she feels the increase may not be working. Will forward for dr Stanford Breed review

## 2017-09-30 ENCOUNTER — Encounter: Payer: Self-pay | Admitting: Family

## 2017-09-30 ENCOUNTER — Telehealth: Payer: Self-pay | Admitting: *Deleted

## 2017-09-30 ENCOUNTER — Ambulatory Visit: Payer: 59 | Admitting: Family

## 2017-09-30 VITALS — BP 136/74 | HR 77 | Temp 98.4°F | Resp 18 | Ht 63.0 in | Wt 189.6 lb

## 2017-09-30 DIAGNOSIS — I10 Essential (primary) hypertension: Secondary | ICD-10-CM | POA: Diagnosis not present

## 2017-09-30 DIAGNOSIS — F329 Major depressive disorder, single episode, unspecified: Secondary | ICD-10-CM

## 2017-09-30 DIAGNOSIS — F32A Depression, unspecified: Secondary | ICD-10-CM

## 2017-09-30 DIAGNOSIS — E119 Type 2 diabetes mellitus without complications: Secondary | ICD-10-CM

## 2017-09-30 DIAGNOSIS — E785 Hyperlipidemia, unspecified: Secondary | ICD-10-CM | POA: Diagnosis not present

## 2017-09-30 DIAGNOSIS — G4733 Obstructive sleep apnea (adult) (pediatric): Secondary | ICD-10-CM | POA: Diagnosis not present

## 2017-09-30 LAB — HEMOGLOBIN A1C: Hgb A1c MFr Bld: 6 % (ref 4.6–6.5)

## 2017-09-30 NOTE — Progress Notes (Signed)
Subjective:    Patient ID: [name] [name], female    DOB: [birth year], 67 y.o.   MRN: [id number]  HPI  Patient is a 67 yr old female who presents today for follow up.    HTN- on benicar hct.  BP Readings from Last 3 Encounters:  09/30/17 136/74  08/14/17 (!) 144/72  06/26/17 (!) 142/72   DM2- not checking sugars at home.   Lab Results  Component Value Date   HGBA1C 5.9 06/26/2017   HGBA1C 6.1 03/27/2017   HGBA1C 6.3 (H) 12/20/2016   Lab Results  Component Value Date   MICROALBUR 1.9 05/23/2016   LDLCALC 33 08/29/2017   CREATININE 0.72 08/29/2017   Hyperlipidemia- maintained on crestor 20mg  one daily.  Lab Results  Component Value Date   CHOL 115 08/29/2017   HDL 50 08/29/2017   LDLCALC 33 08/29/2017   LDLDIRECT 77.7 12/27/2009   TRIG 162 (H) 08/29/2017   CHOLHDL 2.3 08/29/2017   Vit D deficiency-   Obesity- no longer followed by the weight loss clinic. She has lost 6 pounds. Has not started exercise.  Wt Readings from Last 3 Encounters:  09/30/17 189 lb 9.6 oz (86 kg)  08/14/17 195 lb 9.6 oz (88.7 kg)  06/26/17 196 lb (88.9 kg)   Depression- followed by psychiatry.  Continues wellbutrin.   OSA- reports good compliance.  Reports daytime somnolence.  Feels like she could just fall asleep.   Review of Systems See HPI  Past Medical History:  Diagnosis Date  . Acquired deformity of right foot   . CAD (coronary artery disease) primary cardioloigst-  dr Stanford Breed   hx NSTEMI 10-24-2001  s/p  cardiac cath w/ PCI and DES x1 to first diagonal  . Chronic constipation   . Depression   . Fatty liver 04/26/2015  . GERD (gastroesophageal reflux disease)   . History of abnormal cervical Pap smear    ACUS 2012  . History of exercise stress test 05-25-2014  dr Stanford Breed   borderline (indeterminate) with non-specific ST changes (mild ST depression in leads II,III, aVF, & V6, did not meet criteria for ischemia) ;  pt had low intolerence, no cp, normal heartrate and bp  response, no arrhythmias  . History of goiter    as child s/p  removal ,  per pt benign , neck area  (not thyroid)  . History of non-ST elevation myocardial infarction (NSTEMI) 10/24/2001   s/p  PCI and DES to first diagonal  . Hyperlipidemia   . Hypertension   . OSA on CPAP pulmoloigst-  dr young   per study 11-07-2004  severe osa  . Right foot pain   . S/P drug eluting coronary stent placement 10/27/2001   x1 to first diagonal   . Seasonal and perennial allergic rhinitis   . Type 2 diabetes mellitus ([place])   . Vitamin D deficiency   . Wears dentures    upper and lower partial denture  . Wears glasses      Social History   Socioeconomic History  . Marital status: Married    Spouse name: Not on file  . Number of children: 3  . Years of education: Not on file  . Highest education level: Not on file  Occupational History  . Occupation: Air cabin crew    Comment: credit Engineer, drilling: [employer]  Social Needs  . Financial resource strain: Not on file  . Food insecurity:    Worry: Not on  file    Inability: Not on file  . Transportation needs:    Medical: Not on file    Non-medical: Not on file  Tobacco Use  . Smoking status: Former Smoker    Years: 46.00    Types: Cigarettes    Last attempt to quit: 09/23/2012    Years since quitting: 5.0  . Smokeless tobacco: Never Used  Substance and Sexual Activity  . Alcohol use: Yes    Alcohol/week: 0.0 oz    Comment: occasional  . Drug use: No  . Sexual activity: Not on file  Lifestyle  . Physical activity:    Days per week: Not on file    Minutes per session: Not on file  . Stress: Not on file  Relationships  . Social connections:    Talks on phone: Not on file    Gets together: Not on file    Attends religious service: Not on file    Active member of club or organization: Not on file    Attends meetings of clubs or organizations: Not on file    Relationship status: Not on file  .  Intimate partner violence:    Fear of current or ex partner: Not on file    Emotionally abused: Not on file    Physically abused: Not on file    Forced sexual activity: Not on file  Other Topics Concern  . Not on file  Social History Narrative   Caffeine Use:  2 cups coffee daily   Regular exercise:  No       Past Surgical History:  Procedure Laterality Date  . BUNIONECTOMY Left 2012  . BUNIONECTOMY Right 04/19/2017   Procedure: RIGHT BUNIONECTOMY;  Surgeon: Rosemary Holms, DPM;  Location: [facility];  Service: Podiatry;  Laterality: Right;  . CARDIAC CATHETERIZATION  09-11-2002   dr Lia Foyer   well-preserved LVF; continued patency previouly placed stent in diagonal branch;  mild luminal irregularities   . CARDIOVASCULAR STRESS TEST  05-16-2011   dr Stanford Breed   normal nuclear study w/ no evidence ischemia/  normal LV function and wall motion , ef 77%  . CORONARY ANGIOPLASTY WITH STENT PLACEMENT  10-27-2001   dr Lia Foyer   high-grade stenosis first diagonal post PCI and DES (TAXUS);  mild luminal irregularity throughout LAD, RCA, and very mild CFx;  preserved LVF  . goiter removal  1968   per pt benign tumor removed from neck beside throat  . HALLUX VALGUS LAPIDUS Right 04/19/2017   Procedure: HALLUX VALGUS LAPIDUS FUSION;  Surgeon: Rosemary Holms, DPM;  Location: [place];  Service: Podiatry;  Laterality: Right;  . HAMMER TOE SURGERY Right 04/19/2017   Procedure: HAMMER TOE CORRECTION RIGHT 2ND;  Surgeon: Rosemary Holms, DPM;  Location[facility];  Service: Podiatry;  Laterality: Right;  . METATARSAL OSTEOTOMY Right 04/19/2017   Procedure: 2ND METATARSAL OSTEOTOMY;  Surgeon: Rosemary Holms, DPM;  Location: [place];  Service: Podiatry;  Laterality: Right;  . TONSILLECTOMY AND ADENOIDECTOMY  age 39  . TUBAL LIGATION Bilateral 1985    Family History  Problem Relation Age of Onset  . Cancer Mother        throat,  cervical  . Diabetes Mother   . Stroke Mother   . Heart attack Mother   . Heart disease Mother   . Hypertension Mother   . Thyroid disease Mother   . Alcoholism Mother   . Cancer Father  prostate  . Diabetes Sister   . Heart attack Sister   . Hypertension Sister   . Hyperlipidemia Sister   . Colon cancer Neg Hx   . Breast cancer Neg Hx     No Known Allergies  Current Outpatient Medications on File Prior to Visit  Medication Sig Dispense Refill  . amLODipine (NORVASC) 5 MG tablet Take 1 tablet (5 mg total) by mouth daily. 90 tablet 3  . aspirin 81 MG tablet Take 81 mg by mouth daily.      Marland Kitchen buPROPion (WELLBUTRIN XL) 150 MG 24 hr tablet Take 450 mg every morning by mouth.     . Calcium Carbonate-Vitamin D (CALTRATE 600+D) 600-400 MG-UNIT per tablet Take 2 tablets 2 (two) times daily by mouth.     . Cholecalciferol (VITAMIN D3) 3000 units TABS Take 1 tablet by mouth daily. 30 tablet   . glucose blood (ONE TOUCH ULTRA TEST) test strip 1 each by Other route 3 (three) times daily. for testing 100 each 0  . Insulin Pen Needle (BD PEN NEEDLE NANO U/F) 32G X 4 MM MISC USE ONCE DAILY WITH INSULIN INJECTION 100 each 0  . Lancets (ONETOUCH ULTRASOFT) lancets TEST BLOOD SUGAR ONCE DAILY 100 each 0  . liraglutide (VICTOZA) 18 MG/3ML SOPN inject 1.8 milligram subcutaneously daily 9 mL 5  . metFORMIN (GLUCOPHAGE-XR) 750 MG 24 hr tablet Take 1 tablet (750 mg total) by mouth 2 (two) times daily with a meal. 90 tablet 1  . metoprolol tartrate (LOPRESSOR) 50 MG tablet Take 1 tablet (50 mg total) by mouth 2 (two) times daily. (Patient taking differently: Take 50 mg 2 (two) times daily by mouth. ) 60 tablet 5  . Multiple Vitamins-Minerals (MULTIVITAMIN ADULTS 50+ PO) Take 1 tablet every morning by mouth.     . naltrexone (DEPADE) 50 MG tablet Take 25 mg 2 (two) times daily by mouth.   0  . olmesartan-hydrochlorothiazide (BENICAR HCT) 40-12.5 MG tablet Take 1 tablet by mouth daily. 90 tablet 3  .  pantoprazole (PROTONIX) 40 MG tablet Take 1 tablet (40 mg total) by mouth daily. (Patient taking differently: Take 40 mg every morning by mouth. ) 30 tablet 5  . Probiotic Product (PROBIOTIC DAILY PO) Take 1 capsule every morning by mouth.     . rosuvastatin (CRESTOR) 20 MG tablet take 1 tablet by mouth once daily 30 tablet 5  . VRAYLAR capsule   0   No current facility-administered medications on file prior to visit.     BP 136/74 (BP Location: Left Arm, Cuff Size: Normal)   Pulse 77   Temp 98.4 F (36.9 C) (Oral)   Resp 18   Ht 5\' 3"  (1.6 m)   Wt 189 lb 9.6 oz (86 kg)   SpO2 100%   BMI 33.59 kg/m       Objective:   Physical Exam  Constitutional: She is oriented to person, place, and time. She appears well-developed and well-nourished.  Cardiovascular: Normal rate, regular rhythm and normal heart sounds.  No murmur heard. Pulmonary/Chest: Effort normal and breath sounds normal. No respiratory distress. She has no wheezes.  Musculoskeletal: She exhibits no edema.  Neurological: She is alert and oriented to person, place, and time.  Skin: Skin is warm and dry.  Psychiatric: She has a normal mood and affect. Her behavior is normal. Judgment and thought content normal.          Assessment & Plan:  Obstructive sleep apnea-we discussed that her CPAP settings  may need to be adjusted.  I will request a download report from her supplier and placed a referral to Dr. Elsworth Soho  for further management of her obstructive sleep apnea.  Depression-reports is well controlled.  She continues to follow with psychiatry.  Diabetes type 2-clinically stable.  She has lost some weight.  Will check follow-up A1c.  Hypertension-blood pressure stable on current medications.  Hyperlipidemia-LDL at goal, continue Crestor.

## 2017-09-30 NOTE — Patient Instructions (Signed)
Please complete lab work prior to leaving.   

## 2017-09-30 NOTE — Telephone Encounter (Signed)
Pt in office today. [place], [phone] to obtain CPAP download. Unable to reach representative. Will try again later.

## 2017-10-01 NOTE — Addendum Note (Signed)
Addended by: Harl Bowie on: 10/01/2017 10:17 AM   Modules accepted: Orders

## 2017-10-03 ENCOUNTER — Encounter: Payer: Self-pay | Admitting: Internal Medicine

## 2017-10-04 ENCOUNTER — Ambulatory Visit: Payer: 59 | Admitting: Internal Medicine

## 2017-10-04 ENCOUNTER — Encounter: Payer: Self-pay | Admitting: Internal Medicine

## 2017-10-04 DIAGNOSIS — G4733 Obstructive sleep apnea (adult) (pediatric): Secondary | ICD-10-CM | POA: Diagnosis not present

## 2017-10-04 NOTE — Patient Instructions (Signed)
Suggest you try earlier caffeine so it can work as you drive in the morning.  Perhaps an extra cup of coffee, or a otc caffeine tablet like NoDoz.  I will review the download from your CPAP machine. If eligible, we can try replacing your machine with a newer one that self-adjusts. Other wise we may try increasing your pressure setting a bit.  Please call as needed

## 2017-10-04 NOTE — Progress Notes (Signed)
10/04/2017-67 year old female former smoker for sleep evaluation. NPSG 11/07/04 Severe OSA, AHI 45/ hr, CPAP 10.  Last seen in 2013 she was wearing CPAP 10/Advanced with a nasal mask and had lost 10 pounds.  She was lost to follow-up after that. Medical problem list includes allergic rhinitis, CAD/ MI/ stent, obesity, depression, DM 2, HBP, GERD, allergic rhinitis, ----Sleep Consult: [facility]. Pt uses CPAP nightly and just got new supplies. Pt will bring CPAP back in office for DL. Referral by Debbrah Alar to re-establsih Epworth score 10 No break in therapy. "Can't sleep without my CPAP". Denies change in sleep, medication or other factors. For past 6 weeks has felt more sleepy in daytime and concerned about morning drive to work. Gets a coffee on the way, but drinks it at work. Thyroid checked annually by PCP.  Download 96% compliance, AHI 0.5/ hr.  Bedtime 11-12 MN, up 6:30 AM.  Denies opportunity for naps.  Prior to Admission medications   Medication Sig Start Date End Date Taking? Authorizing Provider  amLODipine (NORVASC) 5 MG tablet Take 1 tablet (5 mg total) by mouth daily. 09/10/17 12/09/17 Yes Lelon Perla, MD  aspirin 81 MG tablet Take 81 mg by mouth daily.     Yes [provider]  buPROPion (WELLBUTRIN XL) 150 MG 24 hr tablet Take 450 mg every morning by mouth.    Yes [provider]  Calcium Carbonate-Vitamin D (CALTRATE 600+D) 600-400 MG-UNIT per tablet Take 2 tablets 2 (two) times daily by mouth.    Yes [provider]  Cholecalciferol (VITAMIN D3) 3000 units TABS Take 1 tablet by mouth daily. 06/26/17  Yes Debbrah Alar, NP  glucose blood (ONE TOUCH ULTRA TEST) test strip 1 each by Other route 3 (three) times daily. for testing 05/22/17  Yes Lacy Duverney M, PA-C  Insulin Pen Needle (BD PEN NEEDLE NANO U/F) 32G X 4 MM MISC USE ONCE DAILY WITH INSULIN INJECTION 06/26/17  Yes Debbrah Alar, NP  Lancets (ONETOUCH ULTRASOFT) lancets TEST BLOOD  SUGAR ONCE DAILY 11/30/12  Yes Debbrah Alar, NP  liraglutide (VICTOZA) 18 MG/3ML SOPN inject 1.8 milligram subcutaneously daily 06/26/17  Yes Debbrah Alar, NP  metFORMIN (GLUCOPHAGE-XR) 750 MG 24 hr tablet Take 1 tablet (750 mg total) by mouth 2 (two) times daily with a meal. 06/26/17  Yes Debbrah Alar, NP  metoprolol tartrate (LOPRESSOR) 50 MG tablet Take 1 tablet (50 mg total) by mouth 2 (two) times daily. Patient taking differently: Take 50 mg 2 (two) times daily by mouth.  03/27/17  Yes Debbrah Alar, NP  Multiple Vitamins-Minerals (MULTIVITAMIN ADULTS 50+ PO) Take 1 tablet every morning by mouth.    Yes [provider]  naltrexone (DEPADE) 50 MG tablet Take 25 mg 2 (two) times daily by mouth.  01/24/17  Yes [provider]  olmesartan-hydrochlorothiazide (BENICAR HCT) 40-12.5 MG tablet Take 1 tablet by mouth daily. 08/14/17  Yes Lelon Perla, MD  pantoprazole (PROTONIX) 40 MG tablet Take 1 tablet (40 mg total) by mouth daily. Patient taking differently: Take 40 mg every morning by mouth.  03/27/17  Yes Debbrah Alar, NP  Probiotic Product (PROBIOTIC DAILY PO) Take 1 capsule every morning by mouth.    Yes [provider]  rosuvastatin (CRESTOR) 20 MG tablet take 1 tablet by mouth once daily 05/09/17  Yes Debbrah Alar, NP  VRAYLAR capsule  06/03/17  Yes [provider]   Past Medical History:  Diagnosis Date  . Acquired deformity of right foot   .  CAD (coronary artery disease) primary cardioloigst-  dr Stanford Breed   hx NSTEMI 10-24-2001  s/p  cardiac cath w/ PCI and DES x1 to first diagonal  . Chronic constipation   . Depression   . Fatty liver 04/26/2015  . GERD (gastroesophageal reflux disease)   . History of abnormal cervical Pap smear    ACUS 2012  . History of exercise stress test 05-25-2014  dr Stanford Breed   borderline (indeterminate) with non-specific ST changes (mild ST depression in leads II,III, aVF, & V6, did  not meet criteria for ischemia) ;  pt had low intolerence, no cp, normal heartrate and bp response, no arrhythmias  . History of goiter    as child s/p  removal ,  per pt benign , neck area  (not thyroid)  . History of non-ST elevation myocardial infarction (NSTEMI) 10/24/2001   s/p  PCI and DES to first diagonal  . Hyperlipidemia   . Hypertension   . OSA on CPAP pulmoloigst-  dr Margaretta Chittum   per study 11-07-2004  severe osa  . Right foot pain   . S/P drug eluting coronary stent placement 10/27/2001   x1 to first diagonal   . Seasonal and perennial allergic rhinitis   . Type 2 diabetes mellitus ([place])   . Vitamin D deficiency   . Wears dentures    upper and lower partial denture  . Wears glasses    Past Surgical History:  Procedure Laterality Date  . BUNIONECTOMY Left 2012  . BUNIONECTOMY Right 04/19/2017   Procedure: RIGHT BUNIONECTOMY;  Surgeon: Rosemary Holms, DPM;  Location: [facility];  Service: Podiatry;  Laterality: Right;  . CARDIAC CATHETERIZATION  09-11-2002   dr Lia Foyer   well-preserved LVF; continued patency previouly placed stent in diagonal branch;  mild luminal irregularities   . CARDIOVASCULAR STRESS TEST  05-16-2011   dr Stanford Breed   normal nuclear study w/ no evidence ischemia/  normal LV function and wall motion , ef 77%  . CORONARY ANGIOPLASTY WITH STENT PLACEMENT  10-27-2001   dr Lia Foyer   high-grade stenosis first diagonal post PCI and DES (TAXUS);  mild luminal irregularity throughout LAD, RCA, and very mild CFx;  preserved LVF  . goiter removal  1968   per pt benign tumor removed from neck beside throat  . HALLUX VALGUS LAPIDUS Right 04/19/2017   Procedure: HALLUX VALGUS LAPIDUS FUSION;  Surgeon: Rosemary Holms, DPM;  Location: [facility];  Service: Podiatry;  Laterality: Right;  . HAMMER TOE SURGERY Right 04/19/2017   Procedure: HAMMER TOE CORRECTION RIGHT 2ND;  Surgeon: Rosemary Holms, DPM;  Location: [facility];   Service: Podiatry;  Laterality: Right;  . METATARSAL OSTEOTOMY Right 04/19/2017   Procedure: 2ND METATARSAL OSTEOTOMY;  Surgeon: Rosemary Holms, DPM;  Location: [place];  Service: Podiatry;  Laterality: Right;  . TONSILLECTOMY AND ADENOIDECTOMY  age 27  . TUBAL LIGATION Bilateral 1985   Family History  Problem Relation Age of Onset  . Cancer Mother        throat, cervical  . Diabetes Mother   . Stroke Mother   . Heart attack Mother   . Heart disease Mother   . Hypertension Mother   . Thyroid disease Mother   . Alcoholism Mother   . Cancer Father        prostate  . Diabetes Sister   . Heart attack Sister   . Hypertension Sister   . Hyperlipidemia Sister   . Colon cancer Neg  Hx   . Breast cancer Neg Hx    Social History   Socioeconomic History  . Marital status: Married    Spouse name: Not on file  . Number of children: 3  . Years of education: Not on file  . Highest education level: Not on file  Occupational History  . Occupation: Air cabin crew    Comment: credit Engineer, drilling: [employer]  Social Needs  . Financial resource strain: Not on file  . Food insecurity:    Worry: Not on file    Inability: Not on file  . Transportation needs:    Medical: Not on file    Non-medical: Not on file  Tobacco Use  . Smoking status: Former Smoker    Years: 46.00    Types: Cigarettes    Last attempt to quit: 09/23/2012    Years since quitting: 5.0  . Smokeless tobacco: Never Used  Substance and Sexual Activity  . Alcohol use: Yes    Alcohol/week: 0.0 oz    Comment: occasional  . Drug use: No  . Sexual activity: Not on file  Lifestyle  . Physical activity:    Days per week: Not on file    Minutes per session: Not on file  . Stress: Not on file  Relationships  . Social connections:    Talks on phone: Not on file    Gets together: Not on file    Attends religious service: Not on file    Active member of club or  organization: Not on file    Attends meetings of clubs or organizations: Not on file    Relationship status: Not on file  . Intimate partner violence:    Fear of current or ex partner: Not on file    Emotionally abused: Not on file    Physically abused: Not on file    Forced sexual activity: Not on file  Other Topics Concern  . Not on file  Social History Narrative   Caffeine Use:  2 cups coffee daily   Regular exercise:  No      ROS-see HPI   + = positive Constitutional:    weight loss, night sweats, fevers, chills, +fatigue, lassitude. HEENT:    headaches, difficulty swallowing, tooth/dental problems, sore throat,       sneezing, itching, ear ache, nasal congestion, post nasal drip, snoring CV:    chest pain, orthopnea, PND, swelling in lower extremities, anasarca,                                  dizziness, palpitations Resp:   shortness of breath with exertion or at rest.                productive cough,   non-productive cough, coughing up of blood.              change in color of mucus.  wheezing.   Skin:    rash or lesions. GI: + heartburn, indigestion, +abdominal pain, nausea, vomiting, diarrhea,                 change in bowel habits, loss of appetite GU: dysuria, change in color of urine, no urgency or frequency.   flank pain. MS:   +joint pain, stiffness, decreased range of motion, back pain. Neuro-     nothing unusual Psych:  change in mood or affect.  +depression or  anxiety.   memory loss.  OBJ- Physical Exam General- Alert, Oriented, Affect-appropriate, Distress- none acute, + obese Skin- rash-none, lesions- none, excoriation- none Lymphadenopathy- none Head- atraumatic            Eyes- Gross vision intact, PERRLA, conjunctivae and secretions clear            Ears- Hearing, canals-normal            Nose- Clear, no-Septal dev, mucus, polyps, erosion, perforation             Throat- Mallampati IV , mucosa clear , drainage- none, tonsils- atrophic Neck- flexible ,  trachea midline, no stridor , thyroid nl, carotid no bruit Chest - symmetrical excursion , unlabored           Heart/CV- RRR , no murmur , no gallop  , no rub, nl s1 s2                           - JVD- none , edema- none, stasis changes- none, varices- none           Lung- clear to P&A, wheeze- none, cough- none , dullness-none, rub- none           Chest wall-  Abd-  Br/ Gen/ Rectal- Not done, not indicated Extrem- cyanosis- none, clubbing, none, atrophy- none, strength- nl Neuro- grossly intact to observation

## 2017-10-04 NOTE — Assessment & Plan Note (Signed)
She has been compliant with CPAP therapy without break, confirmed by download showing excellent compliance and control at fixed pressure 10.  We discussed possibility she might be eligible for a new machine, in which case I would change to AutoPap 5-15.  I have no basis for blaming her CPAP for recent awareness of increased daytime sleepiness.  She had added amlodipine for blood pressure but that is not usually sedating.  We can restudy her if necessary. Her symptoms could be related to standard time change, her depression, or some other factor. Plan-for now, try getting caffeine at breakfast, before starting on drive to work.  See what difference that makes.  It may also help to get a little more sleep at night, earlier bedtime.

## 2017-10-08 NOTE — Telephone Encounter (Signed)
Spoke with Amy at [facility]. She states they have not received anything form pt in 1 year. They will contact pt to get download and will fax report to [place].

## 2017-10-11 NOTE — Telephone Encounter (Signed)
Pt seen by Pulmonology on 10/04/17, see OV note and advise?  [name] [name] (Patient) [name] [name] (Patient) General - Other  Pt said she received a call from [place] about reading her card from her cpap machine and they told her it was ordered by [facility] and pt said Dr Annamaria Boots has alread yread it and is asking why it need to be read 2 times would like a call back     [phone] ext [phone] work

## 2017-10-11 NOTE — Telephone Encounter (Signed)
No need for download at this point since it was already reviewed by Dr. Annamaria Boots.

## 2017-10-19 ENCOUNTER — Other Ambulatory Visit: Payer: Self-pay | Admitting: Family

## 2017-10-21 LAB — HM DIABETES EYE EXAM

## 2017-10-21 MED ORDER — PANTOPRAZOLE SODIUM 40 MG PO TBEC: 40.0000 mg | DELAYED_RELEASE_TABLET | Freq: Every morning | ORAL | 5 refills | Status: DC

## 2017-10-22 ENCOUNTER — Telehealth: Payer: Self-pay | Admitting: *Deleted

## 2017-10-22 DIAGNOSIS — E119 Type 2 diabetes mellitus without complications: Secondary | ICD-10-CM

## 2017-10-22 MED ORDER — METFORMIN HCL ER 750 MG PO TB24: 750.0000 mg | ORAL_TABLET | Freq: Two times a day (BID) | ORAL | 1 refills | Status: DC

## 2017-10-22 NOTE — Telephone Encounter (Signed)
Received fax from [facility] requesting refill of Metformin ER 750mg  twice a day w/food. Refill sent.

## 2017-11-01 ENCOUNTER — Encounter: Payer: Self-pay | Admitting: Family

## 2017-11-05 ENCOUNTER — Other Ambulatory Visit: Payer: Self-pay | Admitting: Family

## 2017-11-05 DIAGNOSIS — E119 Type 2 diabetes mellitus without complications: Secondary | ICD-10-CM

## 2017-11-06 ENCOUNTER — Other Ambulatory Visit: Payer: Self-pay

## 2017-11-06 MED ORDER — INSULIN PEN NEEDLE 32G X 4 MM MISC: 0 refills | Status: DC

## 2017-11-06 MED ORDER — METOPROLOL TARTRATE 50 MG PO TABS: 50.0000 mg | ORAL_TABLET | Freq: Two times a day (BID) | ORAL | 5 refills | Status: DC

## 2017-11-21 ENCOUNTER — Other Ambulatory Visit: Payer: Self-pay | Admitting: Family

## 2017-12-17 ENCOUNTER — Telehealth: Payer: Self-pay | Admitting: *Deleted

## 2017-12-17 DIAGNOSIS — E119 Type 2 diabetes mellitus without complications: Secondary | ICD-10-CM

## 2017-12-17 MED ORDER — LIRAGLUTIDE 18 MG/3ML ~~LOC~~ SOPN: PEN_INJECTOR | SUBCUTANEOUS | 1 refills | Status: DC

## 2017-12-17 MED ORDER — METFORMIN HCL ER 750 MG PO TB24: 750.0000 mg | ORAL_TABLET | Freq: Two times a day (BID) | ORAL | 1 refills | Status: DC

## 2017-12-17 NOTE — Telephone Encounter (Signed)
Received fax from [place] requesting 90 day supplies of metformin ER and Victoza to local pharmacy ([facility]). Rxs sent.

## 2018-01-03 ENCOUNTER — Ambulatory Visit (INDEPENDENT_AMBULATORY_CARE_PROVIDER_SITE_OTHER): Payer: Medicare Other | Admitting: Internal Medicine

## 2018-01-03 ENCOUNTER — Encounter: Payer: Self-pay | Admitting: Internal Medicine

## 2018-01-03 DIAGNOSIS — G4733 Obstructive sleep apnea (adult) (pediatric): Secondary | ICD-10-CM

## 2018-01-03 DIAGNOSIS — I2510 Atherosclerotic heart disease of native coronary artery without angina pectoris: Secondary | ICD-10-CM | POA: Diagnosis not present

## 2018-01-03 NOTE — Patient Instructions (Signed)
Order- DME [facility]- please refit mask to reduce leak, and reduce CPAP pressure to 9, continue mask of choice humidifier supplies, [employer]  Please call if we can help

## 2018-01-03 NOTE — Progress Notes (Signed)
10/04/2017-67 year old female former smoker for sleep evaluation. NPSG 11/07/04 Severe OSA, AHI 45/ hr, CPAP 10.  Last seen in 2013 she was wearing CPAP 10/Advanced with a nasal mask and had lost 10 pounds.  She was lost to follow-up after that. Medical problem list includes allergic rhinitis, CAD/ MI/ stent, obesity, depression, DM 2, HBP, GERD, allergic rhinitis, ----Sleep Consult: [facility]ME [facility]. Pt uses CPAP nightly and just got new supplies. Pt will bring CPAP back in office for DL. Referral by Debbrah Alar to re-establsih Epworth score 10 No break in therapy. "Can't sleep without my CPAP". Denies change in sleep, medication or other factors. For past 6 weeks has felt more sleepy in daytime and concerned about morning drive to work. Gets a coffee on the way, but drinks it at work. Thyroid checked annually by PCP.  Download 96% compliance, AHI 0.5/ hr.  Bedtime 11-12 MN, up 6:30 AM.  Denies opportunity for naps.  01/03/2018-67 year old female former smoker followed for OSA, complicated by allergic rhinitis, CAD/MI/stent, obesity, depression, DM 2, HBP, GERD,  CPAP 10/Advanced >> 9 today -----Follows For: Sleep Apnea, feels like her mask is leaking air,  She sleeps much better with CPAP and cannot sleep well without it. Download 100% compliance AHI 0.3/hour.  Significant leak now.  ROS-see HPI   + = positive Constitutional:    weight loss, night sweats, fevers, chills, +fatigue, lassitude. HEENT:    headaches, difficulty swallowing, tooth/dental problems, sore throat,       sneezing, itching, ear ache, nasal congestion, post nasal drip, snoring CV:    chest pain, orthopnea, PND, swelling in lower extremities, anasarca,                                  dizziness, palpitations Resp:   shortness of breath with exertion or at rest.                productive cough,   non-productive cough, coughing up of blood.              change in color of mucus.  wheezing.   Skin:    rash or lesions. GI: +  heartburn, indigestion, +abdominal pain, nausea, vomiting, diarrhea,                 change in bowel habits, loss of appetite GU: dysuria, change in color of urine, no urgency or frequency.   flank pain. MS:   +joint pain, stiffness, decreased range of motion, back pain. Neuro-     nothing unusual Psych:  change in mood or affect.  +depression or anxiety.   memory loss.  OBJ- Physical Exam General- Alert, Oriented, Affect-appropriate, Distress- none acute, + obese Skin- rash-none, lesions- none, excoriation- none Lymphadenopathy- none Head- atraumatic            Eyes- Gross vision intact, PERRLA, conjunctivae and secretions clear            Ears- Hearing, canals-normal            Nose- Clear, no-Septal dev, mucus, polyps, erosion, perforation             Throat- Mallampati IV , mucosa clear , drainage- none, tonsils- atrophic Neck- flexible , trachea midline, no stridor , thyroid nl, carotid no bruit Chest - symmetrical excursion , unlabored           Heart/CV- RRR , no murmur , no gallop  , no rub,  nl s1 s2                           - JVD- none , edema- none, stasis changes- none, varices- none           Lung- clear to P&A, wheeze- none, cough- none , dullness-none, rub- none           Chest wall-  Abd-  Br/ Gen/ Rectal- Not done, not indicated Extrem- cyanosis- none, clubbing, none, atrophy- none, strength- nl Neuro- grossly intact to observation

## 2018-01-05 NOTE — Progress Notes (Signed)
Subjective:    Patient ID: [name] [name], female    DOB: 10/24/1950, 67 y.o.   MRN: [id number]  HPI   Patient is a 67 year old female who presents today for routine follow-up.  Diabetes type 2-she is maintained on metformin, Victoza. Denies hypoglycemia.  Diet is fair.   Lab Results  Component Value Date   HGBA1C 6.0 09/30/2017   HGBA1C 5.9 06/26/2017   HGBA1C 6.1 03/27/2017   Lab Results  Component Value Date   MICROALBUR 1.9 05/23/2016   LDLCALC 33 08/29/2017   CREATININE 0.72 08/29/2017   Hypertension-current blood pressure regimen includes metoprolol 50 mg twice daily, and benicar hct.  Denies CP/SOB or swelling.  BP Readings from Last 3 Encounters:  01/06/18 130/70  01/03/18 (!) 148/82  10/04/17 112/66    Depression- she continues to follow with psychiatry.   CAD-hx ci of diagonal. Followed by cardiology.   GERD- maintained on ppi.  Reports that she still has gerd symptoms despite use of PPI. Uses tums.   Intermittent urinary urgency/incontinence. Worse a night.    Chronic constipation-patient reports that she has not been using MiraLAX recently.  She has tried to increase her water and produce intake.  Continues to have constipation symptoms  Left thumb pain-has been using an over-the-counter thumb brace.  Reports that she continues to have pain and has trouble doing things such as opening jars.  Review of Systems See HPI  Past Medical History:  Diagnosis Date  . Acquired deformity of right foot   . CAD (coronary artery disease) primary cardioloigst-  dr Stanford Breed   hx NSTEMI 10-24-2001  s/p  cardiac cath w/ PCI and DES x1 to first diagonal  . Chronic constipation   . Depression   . Fatty liver 04/26/2015  . GERD (gastroesophageal reflux disease)   . History of abnormal cervical Pap smear    ACUS 2012  . History of exercise stress test 05-25-2014  dr Stanford Breed   borderline (indeterminate) with non-specific ST changes (mild ST depression in leads II,III,  aVF, & V6, did not meet criteria for ischemia) ;  pt had low intolerence, no cp, normal heartrate and bp response, no arrhythmias  . History of goiter    as child s/p  removal ,  per pt benign , neck area  (not thyroid)  . History of non-ST elevation myocardial infarction (NSTEMI) 10/24/2001   s/p  PCI and DES to first diagonal  . Hyperlipidemia   . Hypertension   . OSA on CPAP pulmoloigst-  dr young   per study 11-07-2004  severe osa  . Right foot pain   . S/P drug eluting coronary stent placement 10/27/2001   x1 to first diagonal   . Seasonal and perennial allergic rhinitis   . Type 2 diabetes mellitus ([place])   . Vitamin D deficiency   . Wears dentures    upper and lower partial denture  . Wears glasses      Social History   Socioeconomic History  . Marital status: Married    Spouse name: Not on file  . Number of children: 3  . Years of education: Not on file  . Highest education level: Not on file  Occupational History  . Occupation: Air cabin crew    Comment: credit Engineer, drilling: [employer]  Social Needs  . Financial resource strain: Not on file  . Food insecurity:    Worry: Not on file    Inability: Not on  file  . Transportation needs:    Medical: Not on file    Non-medical: Not on file  Tobacco Use  . Smoking status: Former Smoker    Years: 46.00    Types: Cigarettes    Last attempt to quit: 09/23/2012    Years since quitting: 5.2  . Smokeless tobacco: Never Used  Substance and Sexual Activity  . Alcohol use: Yes    Alcohol/week: 0.0 oz    Comment: occasional  . Drug use: No  . Sexual activity: Not on file  Lifestyle  . Physical activity:    Days per week: Not on file    Minutes per session: Not on file  . Stress: Not on file  Relationships  . Social connections:    Talks on phone: Not on file    Gets together: Not on file    Attends religious service: Not on file    Active member of club or organization: Not on file     Attends meetings of clubs or organizations: Not on file    Relationship status: Not on file  . Intimate partner violence:    Fear of current or ex partner: Not on file    Emotionally abused: Not on file    Physically abused: Not on file    Forced sexual activity: Not on file  Other Topics Concern  . Not on file  Social History Narrative   Caffeine Use:  2 cups coffee daily   Regular exercise:  No       Past Surgical History:  Procedure Laterality Date  . BUNIONECTOMY Left 2012  . BUNIONECTOMY Right 04/19/2017   Procedure: RIGHT BUNIONECTOMY;  Surgeon: Rosemary Holms, DPM;  Location: [facility];  Service: Podiatry;  Laterality: Right;  . CARDIAC CATHETERIZATION  09-11-2002   dr Lia Foyer   well-preserved LVF; continued patency previouly placed stent in diagonal branch;  mild luminal irregularities   . CARDIOVASCULAR STRESS TEST  05-16-2011   dr Stanford Breed   normal nuclear study w/ no evidence ischemia/  normal LV function and wall motion , ef 77%  . CORONARY ANGIOPLASTY WITH STENT PLACEMENT  10-27-2001   dr Lia Foyer   high-grade stenosis first diagonal post PCI and DES (TAXUS);  mild luminal irregularity throughout LAD, RCA, and very mild CFx;  preserved LVF  . goiter removal  1968   per pt benign tumor removed from neck beside throat  . HALLUX VALGUS LAPIDUS Right 04/19/2017   Procedure: HALLUX VALGUS LAPIDUS FUSION;  Surgeon: Rosemary Holms, DPM;  Location: [place];  Service: Podiatry;  Laterality: Right;  . HAMMER TOE SURGERY Right 04/19/2017   Procedure: HAMMER TOE CORRECTION RIGHT 2ND;  Surgeon: Rosemary Holms, DPM;  Location: [facility];  Service: Podiatry;  Laterality: Right;  . METATARSAL OSTEOTOMY Right 04/19/2017   Procedure: 2ND METATARSAL OSTEOTOMY;  Surgeon: Rosemary Holms, DPM;  Location: [place];  Service: Podiatry;  Laterality: Right;  . TONSILLECTOMY AND ADENOIDECTOMY  age 67  . TUBAL LIGATION  Bilateral 1985    Family History  Problem Relation Age of Onset  . Cancer Mother        throat, cervical  . Diabetes Mother   . Stroke Mother   . Heart attack Mother   . Heart disease Mother   . Hypertension Mother   . Thyroid disease Mother   . Alcoholism Mother   . Cancer Father        prostate  . Diabetes Sister   .  Heart attack Sister   . Hypertension Sister   . Hyperlipidemia Sister   . Colon cancer Neg Hx   . Breast cancer Neg Hx     No Known Allergies  Current Outpatient Medications on File Prior to Visit  Medication Sig Dispense Refill  . amLODipine (NORVASC) 5 MG tablet Take 1 tablet (5 mg total) by mouth daily. 90 tablet 3  . aspirin 81 MG tablet Take 81 mg by mouth daily.      Marland Kitchen buPROPion (WELLBUTRIN XL) 150 MG 24 hr tablet Take 450 mg every morning by mouth.     . Calcium Carbonate-Vitamin D (CALTRATE 600+D) 600-400 MG-UNIT per tablet Take 2 tablets 2 (two) times daily by mouth.     . Cholecalciferol (VITAMIN D3) 3000 units TABS Take 1 tablet by mouth daily. 30 tablet   . glucose blood (ONE TOUCH ULTRA TEST) test strip 1 each by Other route 3 (three) times daily. for testing 100 each 0  . Insulin Pen Needle (BD PEN NEEDLE NANO U/F) 32G X 4 MM MISC USE ONCE DAILY WITH INSULIN INJECTION 100 each 0  . Lancets (ONETOUCH ULTRASOFT) lancets TEST BLOOD SUGAR ONCE DAILY 100 each 0  . liraglutide (VICTOZA) 18 MG/3ML SOPN inject 1.8 milligram subcutaneously daily 27 mL 1  . metFORMIN (GLUCOPHAGE-XR) 750 MG 24 hr tablet Take 1 tablet (750 mg total) by mouth 2 (two) times daily with a meal. 180 tablet 1  . metoprolol tartrate (LOPRESSOR) 50 MG tablet Take 1 tablet (50 mg total) by mouth 2 (two) times daily. 60 tablet 5  . Multiple Vitamins-Minerals (MULTIVITAMIN ADULTS 50+ PO) Take 1 tablet every morning by mouth.     . naltrexone (DEPADE) 50 MG tablet Take 25 mg 2 (two) times daily by mouth.   0  . olmesartan-hydrochlorothiazide (BENICAR HCT) 40-12.5 MG tablet Take 1 tablet  by mouth daily. 90 tablet 3  . pantoprazole (PROTONIX) 40 MG tablet Take 1 tablet (40 mg total) by mouth every morning. 30 tablet 5  . Probiotic Product (PROBIOTIC DAILY PO) Take 1 capsule every morning by mouth.     . rosuvastatin (CRESTOR) 20 MG tablet TAKE 1 TABLET BY MOUTH ONCE DAILY 90 tablet 0  . VRAYLAR capsule   0   No current facility-administered medications on file prior to visit.     There were no vitals taken for this visit.      Objective:   Physical Exam  Constitutional: She is oriented to person, place, and time. She appears well-developed and well-nourished.  Cardiovascular: Normal rate, regular rhythm and normal heart sounds.  No murmur heard. Pulmonary/Chest: Effort normal and breath sounds normal. No respiratory distress. She has no wheezes.  Musculoskeletal: She exhibits no edema.  Neurological: She is alert and oriented to person, place, and time.  Skin: Skin is warm and dry.  Psychiatric: She has a normal mood and affect. Her behavior is normal. Judgment and thought content normal.          Assessment & Plan:  Left thumb pain-will refer to sports medicine for further evaluation.  Chronic constipation-I advised the patient to begin MiraLAX 1 tablespoon by mouth once daily.  Titrate to 2 tablespoons once daily if no relief.  Continue to produce an increase water intake.  If symptoms fail to improve we will consider GI referral.  Diabetes type 2-appears clinically well controlled.  Continue current meds will obtain follow-up A1c.  Hyperlipidemia-LDL is stable on current dose of Crestor continue same.  Depression-reports  this is stable she continues with cardiology.  History of coronary artery disease-patient denies chest pain.  She continue statin and aspirin.  Management per cardiology.  UTI- UA 3+ leuks, will culture and begin keflex.

## 2018-01-06 ENCOUNTER — Ambulatory Visit (INDEPENDENT_AMBULATORY_CARE_PROVIDER_SITE_OTHER): Payer: Medicare Other | Admitting: Family

## 2018-01-06 ENCOUNTER — Encounter: Payer: Self-pay | Admitting: Family

## 2018-01-06 VITALS — BP 130/70 | HR 63 | Temp 98.1°F | Resp 16 | Ht 63.0 in | Wt 189.6 lb

## 2018-01-06 DIAGNOSIS — R32 Unspecified urinary incontinence: Secondary | ICD-10-CM | POA: Diagnosis not present

## 2018-01-06 DIAGNOSIS — N3000 Acute cystitis without hematuria: Secondary | ICD-10-CM

## 2018-01-06 DIAGNOSIS — E119 Type 2 diabetes mellitus without complications: Secondary | ICD-10-CM

## 2018-01-06 DIAGNOSIS — E785 Hyperlipidemia, unspecified: Secondary | ICD-10-CM

## 2018-01-06 DIAGNOSIS — I10 Essential (primary) hypertension: Secondary | ICD-10-CM

## 2018-01-06 DIAGNOSIS — M79645 Pain in left finger(s): Secondary | ICD-10-CM

## 2018-01-06 DIAGNOSIS — F32A Depression, unspecified: Secondary | ICD-10-CM

## 2018-01-06 DIAGNOSIS — K5909 Other constipation: Secondary | ICD-10-CM | POA: Diagnosis not present

## 2018-01-06 DIAGNOSIS — I2510 Atherosclerotic heart disease of native coronary artery without angina pectoris: Secondary | ICD-10-CM

## 2018-01-06 DIAGNOSIS — F329 Major depressive disorder, single episode, unspecified: Secondary | ICD-10-CM

## 2018-01-06 LAB — BASIC METABOLIC PANEL
BUN: 14 mg/dL (ref 6–23)
CO2: 27 mEq/L (ref 19–32)
Calcium: 8.9 mg/dL (ref 8.4–10.5)
Chloride: 106 mEq/L (ref 96–112)
Creatinine, Ser: 0.75 mg/dL (ref 0.40–1.20)
GFR: 81.69 mL/min (ref >60.00)
Glucose, Bld: 113 mg/dL — ABNORMAL HIGH (ref 70–99)
Potassium: 3.9 mEq/L (ref 3.5–5.1)
Sodium: 142 mEq/L (ref 135–145)

## 2018-01-06 LAB — POC URINALSYSI DIPSTICK (AUTOMATED)
Bilirubin, UA: NEGATIVE
Blood, UA: NEGATIVE
Glucose, UA: NEGATIVE
Ketones, UA: NEGATIVE
Nitrite, UA: NEGATIVE
Protein, UA: POSITIVE — AB
Spec Grav, UA: 1.025 (ref 1.010–1.025)
Urobilinogen, UA: NEGATIVE E.U./dL — AB
pH, UA: 6 (ref 5.0–8.0)

## 2018-01-06 LAB — HEMOGLOBIN A1C: Hgb A1c MFr Bld: 5.9 % (ref 4.6–6.5)

## 2018-01-06 MED ORDER — CEPHALEXIN 500 MG PO CAPS: 500.0000 mg | ORAL_CAPSULE | Freq: Two times a day (BID) | ORAL | 0 refills | Status: DC

## 2018-01-06 NOTE — Patient Instructions (Signed)
Please begin keflex twice daily for possible urinary tract infection. Work on reflux diet as below.  Food Choices for Gastroesophageal Reflux Disease, Adult When you have gastroesophageal reflux disease (GERD), the foods you eat and your eating habits are very important. Choosing the right foods can help ease your discomfort. What guidelines do I need to follow?  Choose fruits, vegetables, whole grains, and low-fat dairy products.  Choose low-fat meat, fish, and poultry.  Limit fats such as oils, salad dressings, butter, nuts, and avocado.  Keep a food diary. This helps you identify foods that cause symptoms.  Avoid foods that cause symptoms. These may be different for everyone.  Eat small meals often instead of 3 large meals a day.  Eat your meals slowly, in a place where you are relaxed.  Limit fried foods.  Cook foods using methods other than frying.  Avoid drinking alcohol.  Avoid drinking large amounts of liquids with your meals.  Avoid bending over or lying down until 2-3 hours after eating. What foods are not recommended? These are some foods and drinks that may make your symptoms worse: Vegetables Tomatoes. Tomato juice. Tomato and spaghetti sauce. Chili peppers. Onion and garlic. Horseradish. Fruits Oranges, grapefruit, and lemon (fruit and juice). Meats High-fat meats, fish, and poultry. This includes hot dogs, ribs, ham, sausage, salami, and bacon. Dairy Whole milk and chocolate milk. Sour cream. Cream. Butter. Ice cream. Cream cheese. Drinks Coffee and tea. Bubbly (carbonated) drinks or energy drinks. Condiments Hot sauce. Barbecue sauce. Sweets/Desserts Chocolate and cocoa. Donuts. Peppermint and spearmint. Fats and Oils High-fat foods. This includes [place] fries and potato chips. Other Vinegar. Strong spices. This includes black pepper, white pepper, red pepper, cayenne, curry powder, cloves, ginger, and chili powder. The items listed above may not be a  complete list of foods and drinks to avoid. Contact your dietitian for more information. This information is not intended to replace advice given to you by your health care provider. Make sure you discuss any questions you have with your health care provider. Document Released: 11/27/2011 Document Revised: 11/03/2015 Document Reviewed: 04/01/2013 Elsevier Interactive Patient Education  2017 [employer].

## 2018-01-07 ENCOUNTER — Telehealth: Payer: Self-pay | Admitting: *Deleted

## 2018-01-07 LAB — URINE CULTURE
MICRO NUMBER:: 90893091
SPECIMEN QUALITY:: ADEQUATE

## 2018-01-07 NOTE — Telephone Encounter (Signed)
Received fax from [facility] stating refill history at retail and mail order pharmacies indicate pt has not obtained the first Crestor prescription. Left message for pt to return my call. Need to verify if pt has Crestor at home and if she is currently taking it? [place] for [facility] / Triage to discuss with pt.

## 2018-01-09 NOTE — Telephone Encounter (Signed)
Author phoned pt. to clarify crestor usage. Pt. Stated she has been using the crestor, and picks it up at [facility], no refills needed. Pt. Does not know why [facility] has indicated that she has not been using the medication. No other concerns at this time.

## 2018-01-13 NOTE — Assessment & Plan Note (Signed)
She denies acute or worsening situation, followed by cardiology.

## 2018-01-13 NOTE — Assessment & Plan Note (Signed)
She continues to benefit from CPAP with improved sleep, confirmed by good download compliance and control.  Mask leak is a problem. Plan-try reducing CPAP pressure to 9 to reduce the tendency to leak, and ask DME to help with refitting mask.

## 2018-01-23 ENCOUNTER — Ambulatory Visit: Payer: Medicare Other | Admitting: Family Medicine

## 2018-01-27 ENCOUNTER — Encounter: Payer: Self-pay | Admitting: Family Medicine

## 2018-01-27 ENCOUNTER — Ambulatory Visit (INDEPENDENT_AMBULATORY_CARE_PROVIDER_SITE_OTHER): Payer: Medicare Other | Admitting: Family Medicine

## 2018-01-27 VITALS — BP 151/73 | HR 68 | Ht 63.0 in | Wt 186.0 lb

## 2018-01-27 DIAGNOSIS — M79644 Pain in right finger(s): Secondary | ICD-10-CM | POA: Diagnosis not present

## 2018-01-27 DIAGNOSIS — I2510 Atherosclerotic heart disease of native coronary artery without angina pectoris: Secondary | ICD-10-CM | POA: Diagnosis not present

## 2018-01-27 MED ORDER — DICLOFENAC SODIUM 1 % TD GEL: 2.0000 g | Freq: Four times a day (QID) | TRANSDERMAL | 1 refills | Status: DC

## 2018-01-27 NOTE — Patient Instructions (Signed)
Your pain is due to arthritis at the base of your thumb. These are the different medications you can take for this: Tylenol 500mg  1-2 tabs three times a day for pain. Capsaicin, aspercreme, or biofreeze topically up to four times a day may also help with pain. Some supplements that may help for arthritis: Boswellia extract, curcumin, pycnogenol Try topical voltaren gel up to 4 times a day if approved by your insurance. Cortisone injections are an option. Heat or ice 15 minutes at a time 3-4 times a day as needed to help with pain. Thumb spica brace as needed when really painful to rest this. Follow up with me in 1 month or as needed.

## 2018-01-27 NOTE — Progress Notes (Signed)
PCP: Debbrah Alar, NP  Subjective:   HPI: Patient is a 67 y.o. female here for 67 year old female presents with about 2 months of left thumb pain.  She denies any mechanism of injury.  She denies any history of similar pain.  She localizes her pain to the left first A [address] and reports 6/10 pain.  She denies any erythema, swelling, increased warmth.  No numbness or tingling in the hand.  Her pain is worse with use of her thumb.  Improved with a spica brace.  She has not taken any pain medications.  She denies any history of gout.   Past Medical History:  Diagnosis Date  . Acquired deformity of right foot   . CAD (coronary artery disease) primary cardioloigst-  dr Stanford Breed   hx NSTEMI 10-24-2001  s/p  cardiac cath w/ PCI and DES x1 to first diagonal  . Chronic constipation   . Depression   . Fatty liver 04/26/2015  . GERD (gastroesophageal reflux disease)   . History of abnormal cervical Pap smear    ACUS 2012  . History of exercise stress test 05-25-2014  dr Stanford Breed   borderline (indeterminate) with non-specific ST changes (mild ST depression in leads II,III, aVF, & V6, did not meet criteria for ischemia) ;  pt had low intolerence, no cp, normal heartrate and bp response, no arrhythmias  . History of goiter    as child s/p  removal ,  per pt benign , neck area  (not thyroid)  . History of non-ST elevation myocardial infarction (NSTEMI) 10/24/2001   s/p  PCI and DES to first diagonal  . Hyperlipidemia   . Hypertension   . OSA on CPAP pulmoloigst-  dr young   per study 11-07-2004  severe osa  . Right foot pain   . S/P drug eluting coronary stent placement 10/27/2001   x1 to first diagonal   . Seasonal and perennial allergic rhinitis   . Type 2 diabetes mellitus ([place])   . Vitamin D deficiency   . Wears dentures    upper and lower partial denture  . Wears glasses     Current Outpatient Medications on File Prior to Visit  Medication Sig Dispense Refill  . amLODipine (NORVASC)  5 MG tablet Take 1 tablet (5 mg total) by mouth daily. 90 tablet 3  . aspirin 81 MG tablet Take 81 mg by mouth daily.      Marland Kitchen buPROPion (WELLBUTRIN XL) 150 MG 24 hr tablet Take 450 mg every morning by mouth.     . Calcium Carbonate-Vitamin D (CALTRATE 600+D) 600-400 MG-UNIT per tablet Take 2 tablets 2 (two) times daily by mouth.     . cephALEXin (KEFLEX) 500 MG capsule Take 1 capsule (500 mg total) by mouth 2 (two) times daily. 10 capsule 0  . Cholecalciferol (VITAMIN D3) 3000 units TABS Take 1 tablet by mouth daily. 30 tablet   . glucose blood (ONE TOUCH ULTRA TEST) test strip 1 each by Other route 3 (three) times daily. for testing 100 each 0  . Insulin Pen Needle (BD PEN NEEDLE NANO U/F) 32G X 4 MM MISC USE ONCE DAILY WITH INSULIN INJECTION 100 each 0  . Lancets (ONETOUCH ULTRASOFT) lancets TEST BLOOD SUGAR ONCE DAILY 100 each 0  . liraglutide (VICTOZA) 18 MG/3ML SOPN inject 1.8 milligram subcutaneously daily 27 mL 1  . metFORMIN (GLUCOPHAGE-XR) 750 MG 24 hr tablet Take 1 tablet (750 mg total) by mouth 2 (two) times daily with a meal. 180 tablet 1  .  metoprolol tartrate (LOPRESSOR) 50 MG tablet Take 1 tablet (50 mg total) by mouth 2 (two) times daily. 60 tablet 5  . Multiple Vitamins-Minerals (MULTIVITAMIN ADULTS 50+ PO) Take 1 tablet every morning by mouth.     . naltrexone (DEPADE) 50 MG tablet Take 25 mg 2 (two) times daily by mouth.   0  . olmesartan-hydrochlorothiazide (BENICAR HCT) 40-12.5 MG tablet Take 1 tablet by mouth daily. 90 tablet 3  . pantoprazole (PROTONIX) 40 MG tablet Take 1 tablet (40 mg total) by mouth every morning. 30 tablet 5  . Probiotic Product (PROBIOTIC DAILY PO) Take 1 capsule every morning by mouth.     . rosuvastatin (CRESTOR) 20 MG tablet TAKE 1 TABLET BY MOUTH ONCE DAILY 90 tablet 0  . VRAYLAR capsule   0   No current facility-administered medications on file prior to visit.     Past Surgical History:  Procedure Laterality Date  . BUNIONECTOMY Left 2012  .  BUNIONECTOMY Right 04/19/2017   Procedure: RIGHT BUNIONECTOMY;  Surgeon: Rosemary Holms, DPM;  Location: [facility];  Service: Podiatry;  Laterality: Right;  . CARDIAC CATHETERIZATION  09-11-2002   dr Lia Foyer   well-preserved LVF; continued patency previouly placed stent in diagonal branch;  mild luminal irregularities   . CARDIOVASCULAR STRESS TEST  05-16-2011   dr Stanford Breed   normal nuclear study w/ no evidence ischemia/  normal LV function and wall motion , ef 77%  . CORONARY ANGIOPLASTY WITH STENT PLACEMENT  10-27-2001   dr Lia Foyer   high-grade stenosis first diagonal post PCI and DES (TAXUS);  mild luminal irregularity throughout LAD, RCA, and very mild CFx;  preserved LVF  . goiter removal  1968   per pt benign tumor removed from neck beside throat  . HALLUX VALGUS LAPIDUS Right 04/19/2017   Procedure: HALLUX VALGUS LAPIDUS FUSION;  Surgeon: Rosemary Holms, DPM;  Location: [place];  Service: Podiatry;  Laterality: Right;  . HAMMER TOE SURGERY Right 04/19/2017   Procedure: HAMMER TOE CORRECTION RIGHT 2ND;  Surgeon: Rosemary Holms, DPM;  Location[facility];  Service: Podiatry;  Laterality: Right;  . METATARSAL OSTEOTOMY Right 04/19/2017   Procedure: 2ND METATARSAL OSTEOTOMY;  Surgeon: Rosemary Holms, DPM;  Location: [place];  Service: Podiatry;  Laterality: Right;  . TONSILLECTOMY AND ADENOIDECTOMY  age 31  . TUBAL LIGATION Bilateral 1985    No Known Allergies  Social History   Socioeconomic History  . Marital status: Married    Spouse name: Not on file  . Number of children: 3  . Years of education: Not on file  . Highest education level: Not on file  Occupational History  . Occupation: Air cabin crew    Comment: credit Engineer, drilling: [employer]  Social Needs  . Financial resource strain: Not on file  . Food insecurity:    Worry: Not on file    Inability: Not on file  .  Transportation needs:    Medical: Not on file    Non-medical: Not on file  Tobacco Use  . Smoking status: Former Smoker    Years: 46.00    Types: Cigarettes    Last attempt to quit: 09/23/2012    Years since quitting: 5.3  . Smokeless tobacco: Never Used  Substance and Sexual Activity  . Alcohol use: Yes    Alcohol/week: 0.0 standard drinks    Comment: occasional  . Drug use: No  . Sexual activity: Not  on file  Lifestyle  . Physical activity:    Days per week: Not on file    Minutes per session: Not on file  . Stress: Not on file  Relationships  . Social connections:    Talks on phone: Not on file    Gets together: Not on file    Attends religious service: Not on file    Active member of club or organization: Not on file    Attends meetings of clubs or organizations: Not on file    Relationship status: Not on file  . Intimate partner violence:    Fear of current or ex partner: Not on file    Emotionally abused: Not on file    Physically abused: Not on file    Forced sexual activity: Not on file  Other Topics Concern  . Not on file  Social History Narrative   Caffeine Use:  2 cups coffee daily   Regular exercise:  No       Family History  Problem Relation Age of Onset  . Cancer Mother        throat, cervical  . Diabetes Mother   . Stroke Mother   . Heart attack Mother   . Heart disease Mother   . Hypertension Mother   . Thyroid disease Mother   . Alcoholism Mother   . Cancer Father        prostate  . Diabetes Sister   . Heart attack Sister   . Hypertension Sister   . Hyperlipidemia Sister   . Colon cancer Neg Hx   . Breast cancer Neg Hx     BP (!) 151/73   Pulse 68   Ht 5\' 3"  (1.6 m)   Wt 186 lb (84.4 kg)   BMI 32.95 kg/m   Review of Systems: See HPI above.     Objective:  Physical Exam:  Gen: awake, alert, NAD, comfortable in exam room Pulm: breathing unlabored  Right Hand: Inspection: No obvious deformity. No swelling, erythema or  bruising Palpation: mild TTP over 1st CMC.  No TTP carpal tunnel, 1st dorsal compartment ROM: Full ROM of the digits and wrist Strength: 5/5 strength in the forearm, wrist and interosseus muscles Neurovascular: NV intact Special tests: Negative finkelstein's, negative tinnel's at the carpal tunnel, negative Phalen's [place]: mild bone spurring seen at [facility]st [facility].  Left Hand: Inspection: No obvious deformity. No swelling, erythema or bruising Palpation: no TTP ROM: Full ROM of the digits and wrist Strength: 5/5 strength in the forearm, wrist and interosseus muscles Neurovascular: NV intact   Assessment & Plan:  1.  Left first CMC arthritis.  Physical exam inconsistent with de Quervain's or carpal tunnel. - We will order Voltaren gel 4 times daily - Patient may use Tylenol 500 mg 2 tablets every 8 as needed or OTC NSAIDs - Heat/ice as needed for relief - Continue to wear her thumb spica brace as needed - Discussed possible steroid injection which patient declined today - Follow-up in 1 month or as needed

## 2018-02-16 ENCOUNTER — Other Ambulatory Visit: Payer: Self-pay | Admitting: Family

## 2018-02-16 DIAGNOSIS — E119 Type 2 diabetes mellitus without complications: Secondary | ICD-10-CM

## 2018-02-25 ENCOUNTER — Ambulatory Visit: Payer: Medicare Other | Admitting: Family Medicine

## 2018-03-16 ENCOUNTER — Other Ambulatory Visit: Payer: Self-pay | Admitting: Family

## 2018-04-08 ENCOUNTER — Ambulatory Visit: Payer: Medicare Other | Admitting: Family

## 2018-04-09 ENCOUNTER — Ambulatory Visit (INDEPENDENT_AMBULATORY_CARE_PROVIDER_SITE_OTHER): Payer: Medicare Other | Admitting: Family

## 2018-04-09 ENCOUNTER — Encounter: Payer: Self-pay | Admitting: Family

## 2018-04-09 VITALS — BP 144/64 | HR 66 | Temp 98.4°F | Resp 16 | Ht 63.0 in | Wt 186.0 lb

## 2018-04-09 DIAGNOSIS — I10 Essential (primary) hypertension: Secondary | ICD-10-CM | POA: Diagnosis not present

## 2018-04-09 DIAGNOSIS — Z23 Encounter for immunization: Secondary | ICD-10-CM

## 2018-04-09 DIAGNOSIS — E119 Type 2 diabetes mellitus without complications: Secondary | ICD-10-CM

## 2018-04-09 DIAGNOSIS — I2510 Atherosclerotic heart disease of native coronary artery without angina pectoris: Secondary | ICD-10-CM

## 2018-04-09 LAB — COMPREHENSIVE METABOLIC PANEL
ALT: 39 U/L — ABNORMAL HIGH (ref 0–35)
AST: 24 U/L (ref 0–37)
Albumin: 4.3 g/dL (ref 3.5–5.2)
Alkaline Phosphatase: 61 U/L (ref 39–117)
BUN: 11 mg/dL (ref 6–23)
CO2: 31 mEq/L (ref 19–32)
Calcium: 9.4 mg/dL (ref 8.4–10.5)
Chloride: 103 mEq/L (ref 96–112)
Creatinine, Ser: 0.79 mg/dL (ref 0.40–1.20)
GFR: 76.87 mL/min (ref >60.00)
Glucose, Bld: 100 mg/dL — ABNORMAL HIGH (ref 70–99)
Potassium: 3.9 mEq/L (ref 3.5–5.1)
Sodium: 141 mEq/L (ref 135–145)
Total Bilirubin: 0.7 mg/dL (ref 0.2–1.2)
Total Protein: 6.5 g/dL (ref 6.0–8.3)

## 2018-04-09 LAB — HEMOGLOBIN A1C: Hgb A1c MFr Bld: 6 % (ref 4.6–6.5)

## 2018-04-09 MED ORDER — METFORMIN HCL ER 750 MG PO TB24: 750.0000 mg | ORAL_TABLET | Freq: Every day | ORAL | 1 refills | Status: DC

## 2018-04-09 NOTE — Patient Instructions (Addendum)
Decrease metformin to 1 tablet once daily. Check sugar if you develop recurrent symptoms. Let me know if sugar is <80. Please complete lab work piror to leaving.

## 2018-04-09 NOTE — Progress Notes (Signed)
Subjective:    Patient ID: [name] [name], female    DOB: January 14, 1951, 67 y.o.   MRN: [id number]  HPI  Patient is a 67 year old female who presents today for routine follow-up.  This past Monday was her last day of work and she is now officially retired.  She has mixed feelings about this.  She does plan to do some volunteering in the local school.  Reports that she sometimes gets "hot and has some nausea."  Reports that her appetite is low.  She reports that the symptoms she has been experiencing tend to happen when she is not at home.  She has not checked her sugar during these events.  She continues Victoza and metformin. DM2-  Lab Results  Component Value Date   HGBA1C 5.9 01/06/2018   HGBA1C 6.0 09/30/2017   HGBA1C 5.9 06/26/2017   Lab Results  Component Value Date   MICROALBUR 1.9 05/23/2016   LDLCALC 33 08/29/2017   CREATININE 0.75 01/06/2018   HTN- has not taken her meds this AM.   BP Readings from Last 3 Encounters:  04/09/18 (!) 144/64  01/27/18 (!) 151/73  01/06/18 130/70    Review of Systems See HPI  Past Medical History:  Diagnosis Date  . Acquired deformity of right foot   . CAD (coronary artery disease) primary cardioloigst-  dr Stanford Breed   hx NSTEMI 10-24-2001  s/p  cardiac cath w/ PCI and DES x1 to first diagonal  . Chronic constipation   . Depression   . Fatty liver 04/26/2015  . GERD (gastroesophageal reflux disease)   . History of abnormal cervical Pap smear    ACUS 2012  . History of exercise stress test 05-25-2014  dr Stanford Breed   borderline (indeterminate) with non-specific ST changes (mild ST depression in leads II,III, aVF, & V6, did not meet criteria for ischemia) ;  pt had low intolerence, no cp, normal heartrate and bp response, no arrhythmias  . History of goiter    as child s/p  removal ,  per pt benign , neck area  (not thyroid)  . History of non-ST elevation myocardial infarction (NSTEMI) 10/24/2001   s/p  PCI and DES to first diagonal  .  Hyperlipidemia   . Hypertension   . OSA on CPAP pulmoloigst-  dr young   per study 11-07-2004  severe osa  . Right foot pain   . S/P drug eluting coronary stent placement 10/27/2001   x1 to first diagonal   . Seasonal and perennial allergic rhinitis   . Type 2 diabetes mellitus ([place])   . Vitamin D deficiency   . Wears dentures    upper and lower partial denture  . Wears glasses      Social History   Socioeconomic History  . Marital status: Married    Spouse name: Not on file  . Number of children: 3  . Years of education: Not on file  . Highest education level: Not on file  Occupational History  . Occupation: Air cabin crew    Comment: credit Engineer, drilling: [employer]  Social Needs  . Financial resource strain: Not on file  . Food insecurity:    Worry: Not on file    Inability: Not on file  . Transportation needs:    Medical: Not on file    Non-medical: Not on file  Tobacco Use  . Smoking status: Former Smoker    Years: 46.00    Types: Cigarettes  Last attempt to quit: 09/23/2012    Years since quitting: 5.5  . Smokeless tobacco: Never Used  Substance and Sexual Activity  . Alcohol use: Yes    Alcohol/week: 0.0 standard drinks    Comment: occasional  . Drug use: No  . Sexual activity: Not on file  Lifestyle  . Physical activity:    Days per week: Not on file    Minutes per session: Not on file  . Stress: Not on file  Relationships  . Social connections:    Talks on phone: Not on file    Gets together: Not on file    Attends religious service: Not on file    Active member of club or organization: Not on file    Attends meetings of clubs or organizations: Not on file    Relationship status: Not on file  . Intimate partner violence:    Fear of current or ex partner: Not on file    Emotionally abused: Not on file    Physically abused: Not on file    Forced sexual activity: Not on file  Other Topics Concern  . Not on file   Social History Narrative   Caffeine Use:  2 cups coffee daily   Regular exercise:  No       Past Surgical History:  Procedure Laterality Date  . BUNIONECTOMY Left 2012  . BUNIONECTOMY Right 04/19/2017   Procedure: RIGHT BUNIONECTOMY;  Surgeon: Rosemary Holms, DPM;  Location: [facility];  Service: Podiatry;  Laterality: Right;  . CARDIAC CATHETERIZATION  09-11-2002   dr Lia Foyer   well-preserved LVF; continued patency previouly placed stent in diagonal branch;  mild luminal irregularities   . CARDIOVASCULAR STRESS TEST  05-16-2011   dr Stanford Breed   normal nuclear study w/ no evidence ischemia/  normal LV function and wall motion , ef 77%  . CORONARY ANGIOPLASTY WITH STENT PLACEMENT  10-27-2001   dr Lia Foyer   high-grade stenosis first diagonal post PCI and DES (TAXUS);  mild luminal irregularity throughout LAD, RCA, and very mild CFx;  preserved LVF  . goiter removal  1968   per pt benign tumor removed from neck beside throat  . HALLUX VALGUS LAPIDUS Right 04/19/2017   Procedure: HALLUX VALGUS LAPIDUS FUSION;  Surgeon: Rosemary Holms, DPM;  Location: [place];  Service: Podiatry;  Laterality: Right;  . HAMMER TOE SURGERY Right 04/19/2017   Procedure: HAMMER TOE CORRECTION RIGHT 2ND;  Surgeon: Rosemary Holms, DPM;  Location[facility];  Service: Podiatry;  Laterality: Right;  . METATARSAL OSTEOTOMY Right 04/19/2017   Procedure: 2ND METATARSAL OSTEOTOMY;  Surgeon: Rosemary Holms, DPM;  Location: [place];  Service: Podiatry;  Laterality: Right;  . TONSILLECTOMY AND ADENOIDECTOMY  age 60  . TUBAL LIGATION Bilateral 1985    Family History  Problem Relation Age of Onset  . Cancer Mother        throat, cervical  . Diabetes Mother   . Stroke Mother   . Heart attack Mother   . Heart disease Mother   . Hypertension Mother   . Thyroid disease Mother   . Alcoholism Mother   . Cancer Father        prostate  . Diabetes  Sister   . Heart attack Sister   . Hypertension Sister   . Hyperlipidemia Sister   . Colon cancer Neg Hx   . Breast cancer Neg Hx     No Known Allergies  Current Outpatient Medications  on File Prior to Visit  Medication Sig Dispense Refill  . aspirin 81 MG tablet Take 81 mg by mouth daily.      Marland Kitchen buPROPion (WELLBUTRIN XL) 150 MG 24 hr tablet Take 450 mg every morning by mouth.     . Calcium Carbonate-Vitamin D (CALTRATE 600+D) 600-400 MG-UNIT per tablet Take 2 tablets 2 (two) times daily by mouth.     . Cholecalciferol (VITAMIN D3) 3000 units TABS Take 1 tablet by mouth daily. 30 tablet   . diclofenac sodium (VOLTAREN) 1 % GEL Apply 2 g topically 4 (four) times daily. 3 Tube 1  . glucose blood (ONE TOUCH ULTRA TEST) test strip 1 each by Other route 3 (three) times daily. for testing 100 each 0  . Insulin Pen Needle (BD PEN NEEDLE NANO U/F) 32G X 4 MM MISC USE ONCE DAILY WITH INSULIN INJECTIONS 100 each 1  . Lancets (ONETOUCH ULTRASOFT) lancets TEST BLOOD SUGAR ONCE DAILY 100 each 0  . liraglutide (VICTOZA) 18 MG/3ML SOPN inject 1.8 milligram subcutaneously daily 27 mL 1  . metoprolol tartrate (LOPRESSOR) 50 MG tablet Take 1 tablet (50 mg total) by mouth 2 (two) times daily. 60 tablet 5  . Multiple Vitamins-Minerals (MULTIVITAMIN ADULTS 50+ PO) Take 1 tablet every morning by mouth.     . naltrexone (DEPADE) 50 MG tablet Take 25 mg 2 (two) times daily by mouth.   0  . olmesartan-hydrochlorothiazide (BENICAR HCT) 40-12.5 MG tablet Take 1 tablet by mouth daily. 90 tablet 3  . pantoprazole (PROTONIX) 40 MG tablet Take 1 tablet (40 mg total) by mouth every morning. 30 tablet 5  . Probiotic Product (PROBIOTIC DAILY PO) Take 1 capsule every morning by mouth.     . rosuvastatin (CRESTOR) 20 MG tablet TAKE 1 TABLET BY MOUTH ONCE DAILY 90 tablet 0  . VRAYLAR capsule   0  . amLODipine (NORVASC) 5 MG tablet Take 1 tablet (5 mg total) by mouth daily. 90 tablet 3   No current facility-administered  medications on file prior to visit.     BP (!) 144/64 (BP Location: Right Arm, Patient Position: Sitting, Cuff Size: Small)   Pulse 66   Temp 98.4 F (36.9 C) (Oral)   Resp 16   Ht 5\' 3"  (1.6 m)   Wt 186 lb (84.4 kg)   SpO2 100%   BMI 32.95 kg/m       Objective:   Physical Exam  Constitutional: She appears well-developed and well-nourished.  Cardiovascular: Normal rate, regular rhythm and normal heart sounds.  No murmur heard. Pulmonary/Chest: Effort normal and breath sounds normal. No respiratory distress. She has no wheezes.  Psychiatric: She has a normal mood and affect. Her behavior is normal. Judgment and thought content normal.          Assessment & Plan:  Hypertension- SBP mildly elevated, she has not taken her a.m. meds.  Will not make any changes at this time.  Continue current meds.  Diabetes type 2- I suspect that her episodes of nausea and heat intolerance are likely hypoglycemic events.  Her A1c was quite tight last visit.  I have advised her to cut her dose down of her metformin ER from 2 tabs once daily to 1 tablet daily.  I also advised her to keep her glucometer with her and to check her sugar in the event that she has recurrent symptoms.  She is to let me know if she has sugars less than 80.

## 2018-04-10 ENCOUNTER — Encounter: Payer: Self-pay | Admitting: Family

## 2018-04-10 DIAGNOSIS — E119 Type 2 diabetes mellitus without complications: Secondary | ICD-10-CM

## 2018-04-10 MED ORDER — GLUCOSE BLOOD VI STRP: 1.0000 | ORAL_STRIP | Freq: Three times a day (TID) | 12 refills | Status: DC

## 2018-04-24 ENCOUNTER — Telehealth: Payer: Self-pay | Admitting: *Deleted

## 2018-04-24 NOTE — Telephone Encounter (Signed)
Received Diabetic Detailed Written Order from [facility]; forwarded to provider/SLS 11/14

## 2018-04-30 ENCOUNTER — Encounter: Payer: Self-pay | Admitting: Emergency Medicine

## 2018-05-01 DIAGNOSIS — M180 Bilateral primary osteoarthritis of first carpometacarpal joints: Secondary | ICD-10-CM | POA: Insufficient documentation

## 2018-05-01 DIAGNOSIS — M1812 Unilateral primary osteoarthritis of first carpometacarpal joint, left hand: Secondary | ICD-10-CM | POA: Diagnosis not present

## 2018-05-15 ENCOUNTER — Ambulatory Visit (INDEPENDENT_AMBULATORY_CARE_PROVIDER_SITE_OTHER): Payer: Medicare Other | Admitting: Psychiatry

## 2018-05-15 ENCOUNTER — Encounter: Payer: Self-pay | Admitting: Psychiatry

## 2018-05-15 DIAGNOSIS — I2510 Atherosclerotic heart disease of native coronary artery without angina pectoris: Secondary | ICD-10-CM | POA: Diagnosis not present

## 2018-05-15 DIAGNOSIS — F423 Hoarding disorder: Secondary | ICD-10-CM

## 2018-05-15 DIAGNOSIS — F331 Major depressive disorder, recurrent, moderate: Secondary | ICD-10-CM

## 2018-05-15 NOTE — Patient Instructions (Addendum)
Try stopping Naltrexone to see if it's the cause of the GI problems.  If so stay off it.  NAC N-acetylcysteine 600 mg 2-4 capsules daily with food  Consider counseling with Windle Guard, CSW

## 2018-05-15 NOTE — Progress Notes (Signed)
SRAH AKE [id number] [birth year] 67 y.o.  Subjective:   Patient ID:  [name] [name] is a 67 y.o. (DOB 1951-05-10) female.  Chief Complaint:  Chief Complaint  Patient presents with  . Follow-up    depresssion and med change    HPI [name] [name] presents to the office today for follow-up of depression and med change.  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  Asks about weight loss meds.  Can't drink diet sodas bc they taste like diet sodas.  Can't eat breakfast anymore bc it makes her sick.  Retirement date Oct 5 and has been a bit depressed.  All these years she's neglected her house but doesn't do anything about it.  Some hoarding.  Doesn't like to stay at home.  No where to put anything in the house bc the closets are full.  Can't get motivated to do anything about it.  Thinks it's motivation and uncertainty over what to do with things.  Won't let people visit bc she's embarrassed.  Doesn't like the house.  H won't move and won't help get rid of things.   Review of Systems:  Review of Systems  Musculoskeletal: Positive for arthralgias and back pain.  Neurological: Negative for tremors and weakness.  Psychiatric/Behavioral: Positive for dysphoric mood. Negative for agitation, behavioral problems, confusion, decreased concentration, hallucinations, self-injury, sleep disturbance and suicidal ideas. The patient is not nervous/anxious and is not hyperactive.     Medications: I have reviewed the patient's current medications.  Current Outpatient Medications  Medication Sig Dispense Refill  . buPROPion (WELLBUTRIN XL) 150 MG 24 hr tablet Take 450 mg every morning by mouth.     . Calcium Carbonate-Vitamin D (CALTRATE 600+D) 600-400 MG-UNIT per tablet Take 2 tablets 2 (two) times daily by mouth.     . Cholecalciferol (VITAMIN D3) 3000 units TABS Take 1 tablet by mouth daily. 30 tablet   . glucose blood (ONE TOUCH ULTRA TEST) test strip 1 each by Other route 3 (three)  times daily. for testing 100 each 12  . Insulin Pen Needle (BD PEN NEEDLE NANO U/F) 32G X 4 MM MISC USE ONCE DAILY WITH INSULIN INJECTIONS 100 each 1  . Lancets (ONETOUCH ULTRASOFT) lancets TEST BLOOD SUGAR ONCE DAILY 100 each 0  . liraglutide (VICTOZA) 18 MG/3ML SOPN inject 1.8 milligram subcutaneously daily 27 mL 1  . metFORMIN (GLUCOPHAGE-XR) 750 MG 24 hr tablet Take 1 tablet (750 mg total) by mouth daily with breakfast. 180 tablet 1  . metoprolol tartrate (LOPRESSOR) 50 MG tablet Take 1 tablet (50 mg total) by mouth 2 (two) times daily. 60 tablet 5  . Multiple Vitamins-Minerals (MULTIVITAMIN ADULTS 50+ PO) Take 1 tablet every morning by mouth.     . naltrexone (DEPADE) 50 MG tablet Take 25 mg 2 (two) times daily by mouth.   0  . olmesartan-hydrochlorothiazide (BENICAR HCT) 40-12.5 MG tablet Take 1 tablet by mouth daily. 90 tablet 3  . pantoprazole (PROTONIX) 40 MG tablet Take 1 tablet (40 mg total) by mouth every morning. 30 tablet 5  . Probiotic Product (PROBIOTIC DAILY PO) Take 1 capsule every morning by mouth.     . rosuvastatin (CRESTOR) 20 MG tablet TAKE 1 TABLET BY MOUTH ONCE DAILY 90 tablet 0  . amLODipine (NORVASC) 5 MG tablet Take 1 tablet (5 mg total) by mouth daily. 90 tablet 3  . aspirin 81 MG tablet Take 81 mg by mouth daily.      . diclofenac  sodium (VOLTAREN) 1 % GEL Apply 2 g topically 4 (four) times daily. 3 Tube 1  . VRAYLAR capsule   0   No current facility-administered medications for this visit.     Medication Side Effects: None  Allergies: No Known Allergies  Past Medical History:  Diagnosis Date  . Acquired deformity of right foot   . CAD (coronary artery disease) primary cardioloigst-  dr Stanford Breed   hx NSTEMI 10-24-2001  s/p  cardiac cath w/ PCI and DES x1 to first diagonal  . Chronic constipation   . Depression   . Fatty liver 04/26/2015  . GERD (gastroesophageal reflux disease)   . History of abnormal cervical Pap smear    ACUS 2012  . History of  exercise stress test 05-25-2014  dr Stanford Breed   borderline (indeterminate) with non-specific ST changes (mild ST depression in leads II,III, aVF, & V6, did not meet criteria for ischemia) ;  pt had low intolerence, no cp, normal heartrate and bp response, no arrhythmias  . History of goiter    as child s/p  removal ,  per pt benign , neck area  (not thyroid)  . History of non-ST elevation myocardial infarction (NSTEMI) 10/24/2001   s/p  PCI and DES to first diagonal  . Hyperlipidemia   . Hypertension   . OSA on CPAP pulmoloigst-  dr young   per study 11-07-2004  severe osa  . Right foot pain   . S/P drug eluting coronary stent placement 10/27/2001   x1 to first diagonal   . Seasonal and perennial allergic rhinitis   . Type 2 diabetes mellitus ([place])   . Vitamin D deficiency   . Wears dentures    upper and lower partial denture  . Wears glasses     Family History  Problem Relation Age of Onset  . Cancer Mother        throat, cervical  . Diabetes Mother   . Stroke Mother   . Heart attack Mother   . Heart disease Mother   . Hypertension Mother   . Thyroid disease Mother   . Alcoholism Mother   . Cancer Father        prostate  . Diabetes Sister   . Heart attack Sister   . Hypertension Sister   . Hyperlipidemia Sister   . Colon cancer Neg Hx   . Breast cancer Neg Hx     Social History   Socioeconomic History  . Marital status: Married    Spouse name: Not on file  . Number of children: 3  . Years of education: Not on file  . Highest education level: Not on file  Occupational History  . Occupation: Air cabin crew    Comment: credit Engineer, drilling: [employer]  Social Needs  . Financial resource strain: Not on file  . Food insecurity:    Worry: Not on file    Inability: Not on file  . Transportation needs:    Medical: Not on file    Non-medical: Not on file  Tobacco Use  . Smoking status: Former Smoker    Years: 46.00    Types:  Cigarettes    Last attempt to quit: 09/23/2012    Years since quitting: 5.6  . Smokeless tobacco: Never Used  Substance and Sexual Activity  . Alcohol use: Yes    Alcohol/week: 0.0 standard drinks    Comment: occasional  . Drug use: No  . Sexual activity: Not on  file  Lifestyle  . Physical activity:    Days per week: Not on file    Minutes per session: Not on file  . Stress: Not on file  Relationships  . Social connections:    Talks on phone: Not on file    Gets together: Not on file    Attends religious service: Not on file    Active member of club or organization: Not on file    Attends meetings of clubs or organizations: Not on file    Relationship status: Not on file  . Intimate partner violence:    Fear of current or ex partner: Not on file    Emotionally abused: Not on file    Physically abused: Not on file    Forced sexual activity: Not on file  Other Topics Concern  . Not on file  Social History Narrative   Caffeine Use:  2 cups coffee daily   Regular exercise:  No       Past Medical History, Surgical history, Social history, and Family history were reviewed and updated as appropriate.   Please see review of systems for further details on the patient's review from today.   Objective:   Physical Exam:  There were no vitals taken for this visit.  Physical Exam  Constitutional: She is oriented to person, place, and time. She appears well-developed. No distress.  Musculoskeletal: She exhibits no deformity.  Neurological: She is alert and oriented to person, place, and time. She displays no tremor. Coordination and gait normal.  Psychiatric: Her speech is normal and behavior is normal. Judgment and thought content normal. Her mood appears not anxious. Her affect is not angry, not blunt, not labile and not inappropriate. Cognition and memory are normal. She exhibits a depressed mood. She expresses no homicidal and no suicidal ideation. She expresses no suicidal plans  and no homicidal plans.  Insight intact. No auditory or visual hallucinations. No delusions.   Some hoarding causes distress. She is attentive.    Lab Review:     Component Value Date/Time   NA 141 04/09/2018 1130   NA 143 08/29/2017 0850   K 3.9 04/09/2018 1130   CL 103 04/09/2018 1130   CO2 31 04/09/2018 1130   GLUCOSE 100 (H) 04/09/2018 1130   BUN 11 04/09/2018 1130   BUN 13 08/29/2017 0850   CREATININE 0.79 04/09/2018 1130   CREATININE 0.70 12/15/2014 1949   CALCIUM 9.4 04/09/2018 1130   PROT 6.5 04/09/2018 1130   PROT 6.5 08/29/2017 0850   ALBUMIN 4.3 04/09/2018 1130   ALBUMIN 4.3 08/29/2017 0850   AST 24 04/09/2018 1130   ALT 39 (H) 04/09/2018 1130   ALKPHOS 61 04/09/2018 1130   BILITOT 0.7 04/09/2018 1130   BILITOT 0.5 08/29/2017 0850   GFRNONAA 87 08/29/2017 0850   GFRNONAA 80 10/27/2013 0910   GFRAA 100 08/29/2017 0850   GFRAA >89 10/27/2013 0910       Component Value Date/Time   WBC 5.3 04/05/2017 1212   RBC 4.49 04/05/2017 1212   HGB 21.4 (HH) 04/19/2017 1142   HGB 13.3 09/05/2016 1202   HCT 63.0 (H) 04/19/2017 1142   HCT 42.1 09/05/2016 1202   PLT 173.0 04/05/2017 1212   MCV 87.9 04/05/2017 1212   MCV 89 09/05/2016 1202   MCH 28.0 09/05/2016 1202   MCH 29.2 05/09/2011 0931   MCHC 33.1 04/05/2017 1212   RDW 16.0 (H) 04/05/2017 1212   RDW 15.7 (H) 09/05/2016 1202   LYMPHSABS  1.8 04/05/2017 1212   LYMPHSABS 1.7 09/05/2016 1202   MONOABS 0.4 04/05/2017 1212   EOSABS 0.1 04/05/2017 1212   EOSABS 0.1 09/05/2016 1202   BASOSABS 0.0 04/05/2017 1212   BASOSABS 0.0 09/05/2016 1202    No results found for: POCLITH, LITHIUM   No results found for: PHENYTOIN, PHENOBARB, VALPROATE, CBMZ   .res Assessment: Plan:    Major depressive disorder, recurrent episode, moderate (HCC)  Hoarding disorder with excessive acquisition   Try stopping Naltrexone to see if it's the cause of the GI problems.  If so stay off it.  Problems solving around hoarding.   Needs activity to help structure her time.  Then needs strategies to deal with the hoarding, problem solved around that issue.    Mood has been probably ok off the Vraylar but perhaps some reduction in the motivation, but insurance won't cover it now.  No new med is probably warranted at this time.  Trial NAC 1200-2400mg /D for hoarding and mood off label.  Option counseling for hoarding and depresssion with retirement. [employer].  This appt was 30 mins.  FU 6 mos.  Lynder Parents, MD, DFAPA  Please see After Visit Summary for patient specific instructions.  Future Appointments  Date Time Provider [place]  07/11/2018  9:00 AM Debbrah Alar, NP [facility]  01/05/2019 10:00 AM Baird Lyons D, MD [facility] None    No orders of the defined types were placed in this encounter.     -------------------------------

## 2018-05-29 DIAGNOSIS — M1812 Unilateral primary osteoarthritis of first carpometacarpal joint, left hand: Secondary | ICD-10-CM | POA: Diagnosis not present

## 2018-06-09 ENCOUNTER — Ambulatory Visit (INDEPENDENT_AMBULATORY_CARE_PROVIDER_SITE_OTHER): Payer: Medicare Other | Admitting: Family

## 2018-06-09 ENCOUNTER — Encounter: Payer: Self-pay | Admitting: Family

## 2018-06-09 VITALS — BP 156/85 | HR 65 | Temp 98.8°F | Resp 16 | Ht 63.0 in | Wt 187.0 lb

## 2018-06-09 DIAGNOSIS — I2510 Atherosclerotic heart disease of native coronary artery without angina pectoris: Secondary | ICD-10-CM

## 2018-06-09 DIAGNOSIS — I10 Essential (primary) hypertension: Secondary | ICD-10-CM | POA: Diagnosis not present

## 2018-06-09 DIAGNOSIS — K140 Glossitis: Secondary | ICD-10-CM | POA: Diagnosis not present

## 2018-06-09 DIAGNOSIS — R5383 Other fatigue: Secondary | ICD-10-CM | POA: Diagnosis not present

## 2018-06-09 MED ORDER — AMLODIPINE BESYLATE 10 MG PO TABS: 10.0000 mg | ORAL_TABLET | Freq: Every day | ORAL | 1 refills | Status: DC

## 2018-06-09 MED ORDER — METOPROLOL SUCCINATE ER 100 MG PO TB24: 100.0000 mg | ORAL_TABLET | Freq: Every day | ORAL | 1 refills | Status: DC

## 2018-06-09 NOTE — Patient Instructions (Addendum)
Please increase amlodipine from 5mg  to 10mg . Change toprol xl from 50mg  twice daily to 100mg  XL once daily. Call if symptoms worsen or if not improved in 7-10 days.

## 2018-06-09 NOTE — Progress Notes (Signed)
Subjective:    Patient ID: [name] [name], female    DOB: 03-15-1951, 67 y.o.   MRN: [id number]  HPI   Patient is a 67 yr old female who presents today with c/o dull frontal HA and fatigue for the last few days. Reports that it feelslike she is hungry all the time.  Denies Sore throat, nasal drainage, fever or unusual joint pain.   Reports that she had some tongue pain (inflammed taste buds off/on for 1 month but now is better).   BP Readings from Last 3 Encounters:  06/09/18 (!) 156/85  04/09/18 (!) 144/64  01/27/18 (!) 151/73    Review of Systems   See HPI  Past Medical History:  Diagnosis Date  . Acquired deformity of right foot   . CAD (coronary artery disease) primary cardioloigst-  dr Stanford Breed   hx NSTEMI 10-24-2001  s/p  cardiac cath w/ PCI and DES x1 to first diagonal  . Chronic constipation   . Depression   . Fatty liver 04/26/2015  . GERD (gastroesophageal reflux disease)   . History of abnormal cervical Pap smear    ACUS 2012  . History of exercise stress test 05-25-2014  dr Stanford Breed   borderline (indeterminate) with non-specific ST changes (mild ST depression in leads II,III, aVF, & V6, did not meet criteria for ischemia) ;  pt had low intolerence, no cp, normal heartrate and bp response, no arrhythmias  . History of goiter    as child s/p  removal ,  per pt benign , neck area  (not thyroid)  . History of non-ST elevation myocardial infarction (NSTEMI) 10/24/2001   s/p  PCI and DES to first diagonal  . Hyperlipidemia   . Hypertension   . OSA on CPAP pulmoloigst-  dr young   per study 11-07-2004  severe osa  . Right foot pain   . S/P drug eluting coronary stent placement 10/27/2001   x1 to first diagonal   . Seasonal and perennial allergic rhinitis   . Type 2 diabetes mellitus ([place])   . Vitamin D deficiency   . Wears dentures    upper and lower partial denture  . Wears glasses      Social History   Socioeconomic History  . Marital status: Married   Spouse name: Not on file  . Number of children: 3  . Years of education: Not on file  . Highest education level: Not on file  Occupational History  . Occupation: Air cabin crew    Comment: credit Engineer, drilling: [employer]  Social Needs  . Financial resource strain: Not on file  . Food insecurity:    Worry: Not on file    Inability: Not on file  . Transportation needs:    Medical: Not on file    Non-medical: Not on file  Tobacco Use  . Smoking status: Former Smoker    Years: 46.00    Types: Cigarettes    Last attempt to quit: 09/23/2012    Years since quitting: 5.7  . Smokeless tobacco: Never Used  Substance and Sexual Activity  . Alcohol use: Yes    Alcohol/week: 0.0 standard drinks    Comment: occasional  . Drug use: No  . Sexual activity: Not on file  Lifestyle  . Physical activity:    Days per week: Not on file    Minutes per session: Not on file  . Stress: Not on file  Relationships  . Social connections:  Talks on phone: Not on file    Gets together: Not on file    Attends religious service: Not on file    Active member of club or organization: Not on file    Attends meetings of clubs or organizations: Not on file    Relationship status: Not on file  . Intimate partner violence:    Fear of current or ex partner: Not on file    Emotionally abused: Not on file    Physically abused: Not on file    Forced sexual activity: Not on file  Other Topics Concern  . Not on file  Social History Narrative   Caffeine Use:  2 cups coffee daily   Regular exercise:  No       Past Surgical History:  Procedure Laterality Date  . BUNIONECTOMY Left 2012  . BUNIONECTOMY Right 04/19/2017   Procedure: RIGHT BUNIONECTOMY;  Surgeon: Rosemary Holms, DPM;  [facility];  Service: Podiatry;  Laterality: Right;  . CARDIAC CATHETERIZATION  09-11-2002   dr Lia Foyer   well-preserved LVF; continued patency previouly placed stent  in diagonal branch;  mild luminal irregularities   . CARDIOVASCULAR STRESS TEST  05-16-2011   dr Stanford Breed   normal nuclear study w/ no evidence ischemia/  normal LV function and wall motion , ef 77%  . CORONARY ANGIOPLASTY WITH STENT PLACEMENT  10-27-2001   dr Lia Foyer   high-grade stenosis first diagonal post PCI and DES (TAXUS);  mild luminal irregularity throughout LAD, RCA, and very mild CFx;  preserved LVF  . goiter removal  1968   per pt benign tumor removed from neck beside throat  . HALLUX VALGUS LAPIDUS Right 04/19/2017   Procedure: HALLUX VALGUS LAPIDUS FUSION;  Surgeon: Rosemary Holms, DPM;  Location: [place];  Service: Podiatry;  Laterality: Right;  . HAMMER TOE SURGERY Right 04/19/2017   Procedure: HAMMER TOE CORRECTION RIGHT 2ND;  Surgeon: Rosemary Holms, DPM;  Location: [facility];  Service: Podiatry;  Laterality: Right;  . METATARSAL OSTEOTOMY Right 04/19/2017   Procedure: 2ND METATARSAL OSTEOTOMY;  Surgeon: Rosemary Holms, DPM;  Location: [place];  Service: Podiatry;  Laterality: Right;  . TONSILLECTOMY AND ADENOIDECTOMY  age 73  . TUBAL LIGATION Bilateral 1985    Family History  Problem Relation Age of Onset  . Cancer Mother        throat, cervical  . Diabetes Mother   . Stroke Mother   . Heart attack Mother   . Heart disease Mother   . Hypertension Mother   . Thyroid disease Mother   . Alcoholism Mother   . Cancer Father        prostate  . Diabetes Sister   . Heart attack Sister   . Hypertension Sister   . Hyperlipidemia Sister   . Colon cancer Neg Hx   . Breast cancer Neg Hx     No Known Allergies  Current Outpatient Medications on File Prior to Visit  Medication Sig Dispense Refill  . aspirin 81 MG tablet Take 81 mg by mouth daily.      Marland Kitchen buPROPion (WELLBUTRIN XL) 150 MG 24 hr tablet Take 450 mg every morning by mouth.     . Calcium Carbonate-Vitamin D (CALTRATE 600+D) 600-400 MG-UNIT per tablet Take  2 tablets 2 (two) times daily by mouth.     . Cholecalciferol (VITAMIN D3) 3000 units TABS Take 1 tablet by mouth daily. 30 tablet   . diclofenac sodium (VOLTAREN)  1 % GEL Apply 2 g topically 4 (four) times daily. 3 Tube 1  . glucose blood (ONE TOUCH ULTRA TEST) test strip 1 each by Other route 3 (three) times daily. for testing 100 each 12  . Insulin Pen Needle (BD PEN NEEDLE NANO U/F) 32G X 4 MM MISC USE ONCE DAILY WITH INSULIN INJECTIONS 100 each 1  . Lancets (ONETOUCH ULTRASOFT) lancets TEST BLOOD SUGAR ONCE DAILY 100 each 0  . liraglutide (VICTOZA) 18 MG/3ML SOPN inject 1.8 milligram subcutaneously daily 27 mL 1  . meloxicam (MOBIC) 7.5 MG tablet Take by mouth.    . metFORMIN (GLUCOPHAGE-XR) 750 MG 24 hr tablet Take 1 tablet (750 mg total) by mouth daily with breakfast. 180 tablet 1  . metoprolol tartrate (LOPRESSOR) 50 MG tablet Take 1 tablet (50 mg total) by mouth 2 (two) times daily. 60 tablet 5  . Multiple Vitamins-Minerals (MULTIVITAMIN ADULTS 50+ PO) Take 1 tablet every morning by mouth.     . naltrexone (DEPADE) 50 MG tablet Take 25 mg 2 (two) times daily by mouth.   0  . olmesartan-hydrochlorothiazide (BENICAR HCT) 40-12.5 MG tablet Take 1 tablet by mouth daily. 90 tablet 3  . pantoprazole (PROTONIX) 40 MG tablet Take 1 tablet (40 mg total) by mouth every morning. 30 tablet 5  . Probiotic Product (PROBIOTIC DAILY PO) Take 1 capsule every morning by mouth.     . rosuvastatin (CRESTOR) 20 MG tablet TAKE 1 TABLET BY MOUTH ONCE DAILY 90 tablet 0  . VRAYLAR capsule   0  . amLODipine (NORVASC) 5 MG tablet Take 1 tablet (5 mg total) by mouth daily. 90 tablet 3   No current facility-administered medications on file prior to visit.     BP (!) 156/85 (BP Location: Right Arm, Patient Position: Sitting, Cuff Size: Small)   Pulse 65   Temp 98.8 F (37.1 C) (Oral)   Resp 16   Ht 5\' 3"  (1.6 m)   Wt 187 lb (84.8 kg)   SpO2 97%   BMI 33.13 kg/m        Objective:   Physical  Exam Constitutional:      Appearance: She is well-developed.  HENT:     Right Ear: Tympanic membrane and ear canal normal.     Left Ear: Tympanic membrane and ear canal normal.     Mouth/Throat:     Mouth: Mucous membranes are dry.     Comments: Tongue appears normal Eyes:     General: No scleral icterus.       Right eye: No discharge.        Left eye: No discharge.  Neck:     Musculoskeletal: Neck supple.     Thyroid: No thyromegaly.  Cardiovascular:     Rate and Rhythm: Normal rate and regular rhythm.     Heart sounds: Normal heart sounds. No murmur.  Pulmonary:     Effort: Pulmonary effort is normal. No respiratory distress.     Breath sounds: Normal breath sounds. No wheezing.  Lymphadenopathy:     Cervical: No cervical adenopathy.  Skin:    General: Skin is warm and dry.  Neurological:     Mental Status: She is alert and oriented to person, place, and time.  Psychiatric:        Behavior: Behavior normal.        Thought Content: Thought content normal.        Judgment: Judgment normal.  Assessment & Plan:  Fatigue- check cmet cbc, tsh.  Suspect that patient may have a virus. She is advised to let me know if symptoms are not improved in 7-10 days. We discussed supportive measures in the meantime.   HTN- will increase amlodipine from 5mg  to 10mg . Change toprol xl from 50mg  twice daily to 100mg  XL once daily.  Tongue problem/glossitis- obtain follow up b12.

## 2018-06-10 LAB — TSH: TSH: 0.93 u[IU]/mL (ref 0.35–4.50)

## 2018-06-10 LAB — COMPREHENSIVE METABOLIC PANEL
ALT: 37 U/L — ABNORMAL HIGH (ref 0–35)
AST: 24 U/L (ref 0–37)
Albumin: 4.2 g/dL (ref 3.5–5.2)
Alkaline Phosphatase: 61 U/L (ref 39–117)
BUN: 17 mg/dL (ref 6–23)
CO2: 31 mEq/L (ref 19–32)
Calcium: 10.1 mg/dL (ref 8.4–10.5)
Chloride: 105 mEq/L (ref 96–112)
Creatinine, Ser: 0.84 mg/dL (ref 0.40–1.20)
GFR: 71.58 mL/min (ref >60.00)
Glucose, Bld: 80 mg/dL (ref 70–99)
Potassium: 3.9 mEq/L (ref 3.5–5.1)
Sodium: 143 mEq/L (ref 135–145)
Total Bilirubin: 0.5 mg/dL (ref 0.2–1.2)
Total Protein: 6.5 g/dL (ref 6.0–8.3)

## 2018-06-10 LAB — CBC WITH DIFFERENTIAL/PLATELET
Basophils Absolute: 0.1 10*3/uL (ref 0.0–0.1)
Basophils Relative: 1.2 % (ref 0.0–3.0)
Eosinophils Absolute: 0.1 10*3/uL (ref 0.0–0.7)
Eosinophils Relative: 2.3 % (ref 0.0–5.0)
HCT: 38.9 % (ref 36.0–46.0)
Hemoglobin: 13.1 g/dL (ref 12.0–15.0)
Lymphocytes Relative: 35.9 % (ref 12.0–46.0)
Lymphs Abs: 2.3 10*3/uL (ref 0.7–4.0)
MCHC: 33.7 g/dL (ref 30.0–36.0)
MCV: 84.6 fl (ref 78.0–100.0)
Monocytes Absolute: 0.4 10*3/uL (ref 0.1–1.0)
Monocytes Relative: 7 % (ref 3.0–12.0)
Neutro Abs: 3.4 10*3/uL (ref 1.4–7.7)
Neutrophils Relative %: 53.6 % (ref 43.0–77.0)
Platelets: 192 10*3/uL (ref 150.0–400.0)
RBC: 4.6 Mil/uL (ref 3.87–5.11)
RDW: 16.1 % — ABNORMAL HIGH (ref 11.5–15.5)
WBC: 6.3 10*3/uL (ref 4.0–10.5)

## 2018-06-10 LAB — B12 AND FOLATE PANEL
Folate: 20.2 ng/mL (ref >5.9)
Vitamin B-12: 293 pg/mL (ref 211–911)

## 2018-06-21 ENCOUNTER — Other Ambulatory Visit: Payer: Self-pay | Admitting: Family

## 2018-06-24 ENCOUNTER — Telehealth: Payer: Self-pay | Admitting: Family

## 2018-06-24 NOTE — Telephone Encounter (Signed)
Ok with me 

## 2018-06-24 NOTE — Telephone Encounter (Signed)
That is fine with me. Please call her to schedule appointment.

## 2018-06-24 NOTE — Telephone Encounter (Signed)
Pt is requesting to switch provider due to her no longer working in [employer] and she lives in [place] so [place] would be closer. Ok to [facility]?

## 2018-06-24 NOTE — Telephone Encounter (Signed)
Pt scheduled for 07/14/18 @ 11:30 with Clarene Reamer.

## 2018-06-25 ENCOUNTER — Other Ambulatory Visit: Payer: Self-pay | Admitting: Family

## 2018-06-25 DIAGNOSIS — E119 Type 2 diabetes mellitus without complications: Secondary | ICD-10-CM

## 2018-07-02 ENCOUNTER — Encounter: Payer: Self-pay | Admitting: Physician Assistant

## 2018-07-02 ENCOUNTER — Ambulatory Visit (INDEPENDENT_AMBULATORY_CARE_PROVIDER_SITE_OTHER): Payer: PPO | Admitting: Physician Assistant

## 2018-07-02 ENCOUNTER — Telehealth: Payer: Self-pay | Admitting: Cardiology

## 2018-07-02 VITALS — BP 130/74 | HR 89 | Ht 63.0 in | Wt 190.0 lb

## 2018-07-02 DIAGNOSIS — I2510 Atherosclerotic heart disease of native coronary artery without angina pectoris: Secondary | ICD-10-CM

## 2018-07-02 DIAGNOSIS — E119 Type 2 diabetes mellitus without complications: Secondary | ICD-10-CM

## 2018-07-02 DIAGNOSIS — E785 Hyperlipidemia, unspecified: Secondary | ICD-10-CM | POA: Diagnosis not present

## 2018-07-02 DIAGNOSIS — I10 Essential (primary) hypertension: Secondary | ICD-10-CM | POA: Diagnosis not present

## 2018-07-02 DIAGNOSIS — G4733 Obstructive sleep apnea (adult) (pediatric): Secondary | ICD-10-CM | POA: Diagnosis not present

## 2018-07-02 DIAGNOSIS — I358 Other nonrheumatic aortic valve disorders: Secondary | ICD-10-CM

## 2018-07-02 DIAGNOSIS — R0600 Dyspnea, unspecified: Secondary | ICD-10-CM

## 2018-07-02 DIAGNOSIS — R0609 Other forms of dyspnea: Secondary | ICD-10-CM

## 2018-07-02 NOTE — Telephone Encounter (Signed)
Returned call to patient she stated she has been sob for the past week.She complains of heart beating hard.No chest pain.Appointment scheduled with Almyra Deforest PA this afternoon at 2:00 pm.Advised to bring a list of all medications.

## 2018-07-02 NOTE — Progress Notes (Signed)
Cardiology Office Note    Date:  07/04/2018   ID:  [name], [name] 1950-11-21, MRN [id number]  PCP:  Debbrah Alar, NP  Cardiologist:  Dr. Stanford Breed  Chief Complaint  Patient presents with  . Follow-up    seen for Dr. Stanford Breed    History of Present Illness:  [name] [name] is a 68 y.o. female with PMH of CAD, hyperlipidemia, history of fatty liver disease, hypertension, obstructive sleep apnea and DM2.  Patient had prior history of PCI to the first diagonal in May 2003 in the setting of NSTEMI.  Her most recent cardiac catheterization in April 2004 showed no other significant obstructive disease, normal EF.  Myoview in December 2012 was normal.  ETT in December 2015 showed borderline changes in the inferior lead.  Dr. Stanford Breed last saw the patient in March 2019, she was having dyspnea with more extreme activities but not with routine activity.  She was seen by her PCP in December 2019 for fatigue.  It was suspected patient may had a virus.  Her blood pressure was elevated, Toprol-XL was increased to 100 mg daily.  Amlodipine was increased to 10 mg daily.  TSH was normal.  His CBC did not show any sign of anemia.   Patient contacted the cardiology service today on 07/02/2018 complaining of increasing shortness of breath for the past week.  She says she could barely pick up clothing from the hanger before she gets very short of breath.  Her dyspnea only occurs with exertion.  In recollection, her previous anginal symptom in 2003 was jaw pain chest pain, she denies any recent pain recently.  On physical exam, she has a aortic murmur, I will obtain an echocardiogram.  I will also obtain Lexiscan stress test to make sure her recent symptom is not coming from coronary artery disease.  Overall on physical exam, she appears to be euvolemic without significant lower extremity edema, orthopnea or PND.    Past Medical History:  Diagnosis Date  . Acquired deformity of right foot   . CAD (coronary  artery disease) primary cardioloigst-  dr Stanford Breed   hx NSTEMI 10-24-2001  s/p  cardiac cath w/ PCI and DES x1 to first diagonal  . Chronic constipation   . Depression   . Fatty liver 04/26/2015  . GERD (gastroesophageal reflux disease)   . History of abnormal cervical Pap smear    ACUS 2012  . History of exercise stress test 05-25-2014  dr Stanford Breed   borderline (indeterminate) with non-specific ST changes (mild ST depression in leads II,III, aVF, & V6, did not meet criteria for ischemia) ;  pt had low intolerence, no cp, normal heartrate and bp response, no arrhythmias  . History of goiter    as child s/p  removal ,  per pt benign , neck area  (not thyroid)  . History of non-ST elevation myocardial infarction (NSTEMI) 10/24/2001   s/p  PCI and DES to first diagonal  . Hyperlipidemia   . Hypertension   . OSA on CPAP pulmoloigst-  dr young   per study 11-07-2004  severe osa  . Right foot pain   . S/P drug eluting coronary stent placement 10/27/2001   x1 to first diagonal   . Seasonal and perennial allergic rhinitis   . Type 2 diabetes mellitus ([place])   . Vitamin D deficiency   . Wears dentures    upper and lower partial denture  . Wears glasses     Past Surgical History:  Procedure Laterality Date  . BUNIONECTOMY Left 2012  . BUNIONECTOMY Right 04/19/2017   Procedure: RIGHT BUNIONECTOMY;  Surgeon: Rosemary Holms, DPM;  Location: [facility];  Service: Podiatry;  Laterality: Right;  . CARDIAC CATHETERIZATION  09-11-2002   dr Lia Foyer   well-preserved LVF; continued patency previouly placed stent in diagonal branch;  mild luminal irregularities   . CARDIOVASCULAR STRESS TEST  05-16-2011   dr Stanford Breed   normal nuclear study w/ no evidence ischemia/  normal LV function and wall motion , ef 77%  . CORONARY ANGIOPLASTY WITH STENT PLACEMENT  10-27-2001   dr Lia Foyer   high-grade stenosis first diagonal post PCI and DES (TAXUS);  mild luminal irregularity throughout LAD,  RCA, and very mild CFx;  preserved LVF  . goiter removal  1968   per pt benign tumor removed from neck beside throat  . HALLUX VALGUS LAPIDUS Right 04/19/2017   Procedure: HALLUX VALGUS LAPIDUS FUSION;  Surgeon: Rosemary Holms, DPM;  Location: [place];  Service: Podiatry;  Laterality: Right;  . HAMMER TOE SURGERY Right 04/19/2017   Procedure: HAMMER TOE CORRECTION RIGHT 2ND;  Surgeon: Rosemary Holms, DPM;  Location[facility];  Service: Podiatry;  Laterality: Right;  . METATARSAL OSTEOTOMY Right 04/19/2017   Procedure: 2ND METATARSAL OSTEOTOMY;  Surgeon: Rosemary Holms, DPM;  Location: [place];  Service: Podiatry;  Laterality: Right;  . TONSILLECTOMY AND ADENOIDECTOMY  age 81  . TUBAL LIGATION Bilateral 1985    Current Medications: Outpatient Medications Prior to Visit  Medication Sig Dispense Refill  . amLODipine (NORVASC) 10 MG tablet Take 1 tablet (10 mg total) by mouth daily. 90 tablet 1  . aspirin 81 MG tablet Take 81 mg by mouth daily.      Marland Kitchen buPROPion (WELLBUTRIN XL) 150 MG 24 hr tablet Take 450 mg every morning by mouth.     . Calcium Carbonate-Vitamin D (CALTRATE 600+D) 600-400 MG-UNIT per tablet Take 2 tablets 2 (two) times daily by mouth.     . Cholecalciferol (VITAMIN D3) 3000 units TABS Take 1 tablet by mouth daily. 30 tablet   . glucose blood (ONE TOUCH ULTRA TEST) test strip 1 each by Other route 3 (three) times daily. for testing 100 each 12  . Insulin Pen Needle (BD PEN NEEDLE NANO U/F) 32G X 4 MM MISC USE ONCE DAILY WITH INSULIN INJECTIONS 100 each 1  . Lancets (ONETOUCH ULTRASOFT) lancets TEST BLOOD SUGAR ONCE DAILY 100 each 0  . liraglutide (VICTOZA) 18 MG/3ML SOPN ADMINISTER 1.8 MG UNDER THE SKIN DAILY 27 mL 1  . meloxicam (MOBIC) 7.5 MG tablet Take by mouth.    . metFORMIN (GLUCOPHAGE-XR) 750 MG 24 hr tablet Take 1 tablet (750 mg total) by mouth daily with breakfast. 180 tablet 1  . metoprolol succinate (TOPROL-XL)  100 MG 24 hr tablet Take 1 tablet (100 mg total) by mouth daily. Take with or immediately following a meal. 90 tablet 1  . Multiple Vitamins-Minerals (MULTIVITAMIN ADULTS 50+ PO) Take 1 tablet every morning by mouth.     . olmesartan-hydrochlorothiazide (BENICAR HCT) 40-12.5 MG tablet Take 1 tablet by mouth daily. 90 tablet 3  . pantoprazole (PROTONIX) 40 MG tablet Take 1 tablet (40 mg total) by mouth every morning. 30 tablet 5  . Probiotic Product (PROBIOTIC DAILY PO) Take 1 capsule every morning by mouth.     . rosuvastatin (CRESTOR) 20 MG tablet TAKE 1 TABLET BY MOUTH ONCE DAILY 90 tablet 0  . diclofenac  sodium (VOLTAREN) 1 % GEL Apply 2 g topically 4 (four) times daily. 3 Tube 1  . metoprolol tartrate (LOPRESSOR) 50 MG tablet Take 1 tablet (50 mg total) by mouth 2 (two) times daily. 60 tablet 5  . naltrexone (DEPADE) 50 MG tablet Take 25 mg 2 (two) times daily by mouth.   0  . VRAYLAR capsule   0   No facility-administered medications prior to visit.      Allergies:   Patient has no known allergies.   Social History   Socioeconomic History  . Marital status: Married    Spouse name: Not on file  . Number of children: 3  . Years of education: Not on file  . Highest education level: Not on file  Occupational History  . Occupation: Air cabin crew    Comment: credit Engineer, drilling: [employer]  Social Needs  . Financial resource strain: Not on file  . Food insecurity:    Worry: Not on file    Inability: Not on file  . Transportation needs:    Medical: Not on file    Non-medical: Not on file  Tobacco Use  . Smoking status: Former Smoker    Years: 46.00    Types: Cigarettes    Last attempt to quit: 09/23/2012    Years since quitting: 5.7  . Smokeless tobacco: Never Used  Substance and Sexual Activity  . Alcohol use: Yes    Alcohol/week: 0.0 standard drinks    Comment: occasional  . Drug use: No  . Sexual activity: Not on file  Lifestyle  .  Physical activity:    Days per week: Not on file    Minutes per session: Not on file  . Stress: Not on file  Relationships  . Social connections:    Talks on phone: Not on file    Gets together: Not on file    Attends religious service: Not on file    Active member of club or organization: Not on file    Attends meetings of clubs or organizations: Not on file    Relationship status: Not on file  Other Topics Concern  . Not on file  Social History Narrative   Caffeine Use:  2 cups coffee daily   Regular exercise:  No        Family History:  The patient's family history includes Alcoholism in her mother; Cancer in her father and mother; Diabetes in her mother and sister; Heart attack in her mother and sister; Heart disease in her mother; Hyperlipidemia in her sister; Hypertension in her mother and sister; Stroke in her mother; Thyroid disease in her mother.   ROS:   Please see the history of present illness.    ROS All other systems reviewed and are negative.   PHYSICAL EXAM:   VS:  BP 130/74   Pulse 89   Ht 5\' 3"  (1.6 m)   Wt 190 lb (86.2 kg)   SpO2 96%   BMI 33.66 kg/m    GEN: Well nourished, well developed, in no acute distress  HEENT: normal  Neck: no JVD, carotid bruits, or masses Cardiac: RRR; no rubs, or gallops,no edema  3/6 systolic murmur at RUSB Respiratory:  clear to auscultation bilaterally, normal work of breathing GI: soft, nontender, nondistended, + BS MS: no deformity or atrophy  Skin: warm and dry, no rash Neuro:  Alert and Oriented x 3, Strength and sensation are intact Psych: euthymic mood, full affect  Wt Readings from Last 3 Encounters:  07/02/18 190 lb (86.2 kg)  06/09/18 187 lb (84.8 kg)  04/09/18 186 lb (84.4 kg)      Studies/Labs Reviewed:   EKG:  EKG is ordered today.  The ekg ordered today demonstrates normal sinus rhythm without significant ST-T wave changes  Recent Labs: 06/09/2018: ALT 37; BUN 17; Creatinine, Ser 0.84; Hemoglobin  13.1; Platelets 192.0; Potassium 3.9; Sodium 143; TSH 0.93   Lipid Panel    Component Value Date/Time   CHOL 115 08/29/2017 0850   TRIG 162 (H) 08/29/2017 0850   HDL 50 08/29/2017 0850   CHOLHDL 2.3 08/29/2017 0850   CHOLHDL 2 05/23/2016 0957   VLDL 24.8 05/23/2016 0957   LDLCALC 33 08/29/2017 0850   LDLDIRECT 77.7 12/27/2009 0942    Additional studies/ records that were reviewed today include:   ETT 05/25/2014 Comments: The patient had an low exercise tolerance. There was no chest  pain. There was an increased level of dyspnea. There were no  arrhythmias, a normal heart rate response and normal BP response. There were borderline ST T wave changes and a normal heart rate  recovery. The patient did not achieve her target heart rate.  She did have some mild ST depression in leads II, III, aVF and V6 which did not meet criteria for ischemia. However, this was a  submaximal test.   Recommendations: Suggest further study with pharmacologic stress testing if  clinically indicated.    ASSESSMENT:    1. Dyspnea on exertion   2. Aortic heart murmur   3. Coronary artery disease involving native coronary artery of native heart without angina pectoris   4. Hyperlipidemia LDL goal <70   5. Essential hypertension   6. Controlled type 2 diabetes mellitus without complication, without long-term current use of insulin ([place])   7. OSA (obstructive sleep apnea)      PLAN:  In order of problems listed above:  1. Dyspnea on exertion: She appears to be euvolemic on physical exam.  I will obtain an echocardiogram and Lexiscan stress test.  Although the previous anginal symptom was jaw pain, she did not have any recent jaw pain.  Her dyspnea has significantly increased in the past several weeks.    2. Aortic heart murmur: Heard on physical exam, however patient was not aware of this before.  I will obtain an echocardiogram  3. CAD: Denies any recent chest pain or jaw pain.  However  the dyspnea on exertion is quite concerning.  I recommend a Lexiscan Myoview  4. Hypertension: Blood pressure stable on current therapy  5. Hyperlipidemia: On Crestor 20 mg daily  6. DM2: Managed by primary care provider    Medication Adjustments/Labs and Tests Ordered: Current medicines are reviewed at length with the patient today.  Concerns regarding medicines are outlined above.  Medication changes, Labs and Tests ordered today are listed in the Patient Instructions below. Patient Instructions  Medication Instructions:  The current medical regimen is effective;  continue present plan and medications.  If you need a refill on your cardiac medications before your next appointment, please call your pharmacy.   Testing/Procedures: Echocardiogram (within 3 weeks before follow up) - Your physician has requested that you have an echocardiogram. Echocardiography is a painless test that uses sound waves to create images of your heart. It provides your doctor with information about the size and shape of your heart and how well your heart's chambers and valves are working. This procedure takes approximately one  hour. There are no restrictions for this procedure. This will be performed at our [facility] location - [address].  Your physician has requested that you have a lexiscan myoview. (within 3 weeks before follow up) A cardiac stress test is a cardiological test that measures the heart's ability to respond to external stress in a controlled clinical environment. The stress response is induced by intravenous pharmacological stimulation.   Follow-Up: At [facility], you and your health needs are our priority.  As part of our continuing mission to provide you with exceptional heart care, we have created designated Provider Care Teams.  These Care Teams include your primary Cardiologist (physician) and Advanced Practice Providers (APPs -  Physician Assistants and Nurse  Practitioners) who all work together to provide you with the care you need, when you need it. You will need a follow up appointment in 3 weeks once tests are completed  Please call our office 2 months in advance to schedule this appointment.  You may see Dr.Crenshaw or one of the following Advanced Practice Providers on your designated Care Team:   Kerin Ransom, PA-C Roby Lofts, [place] . Sande Rives, PA-C       Signed, [place], [place]  07/04/2018 11:55 PM    [facility] [place], [place], [place]  [address] Phone: [phone]; Fax: [phone]

## 2018-07-02 NOTE — Telephone Encounter (Signed)
New message     Pt c/o Shortness Of Breath: STAT if SOB developed within the last 24 hours or pt is noticeably SOB on the phone  1. Are you currently SOB (can you hear that pt is SOB on the phone)? no  2. How long have you been experiencing SOB? A week   3. Are you SOB when sitting or when up moving around? Moving   4. Are you currently experiencing any other symptoms? No energy. Left ankle swelling, heart pounding in chest hard , cant really lift arms . Pt declined me making an appt and said she will just see her pcp but would like to speak with nurse

## 2018-07-02 NOTE — Patient Instructions (Signed)
Medication Instructions:  The current medical regimen is effective;  continue present plan and medications.  If you need a refill on your cardiac medications before your next appointment, please call your pharmacy.   Testing/Procedures: Echocardiogram (within 3 weeks before follow up) - Your physician has requested that you have an echocardiogram. Echocardiography is a painless test that uses sound waves to create images of your heart. It provides your doctor with information about the size and shape of your heart and how well your heart's chambers and valves are working. This procedure takes approximately one hour. There are no restrictions for this procedure. This will be performed at our [facility] location - [address].  Your physician has requested that you have a lexiscan myoview. (within 3 weeks before follow up) A cardiac stress test is a cardiological test that measures the heart's ability to respond to external stress in a controlled clinical environment. The stress response is induced by intravenous pharmacological stimulation.   Follow-Up: At [facility], you and your health needs are our priority.  As part of our continuing mission to provide you with exceptional heart care, we have created designated Provider Care Teams.  These Care Teams include your primary Cardiologist (physician) and Advanced Practice Providers (APPs -  Physician Assistants and Nurse Practitioners) who all work together to provide you with the care you need, when you need it. You will need a follow up appointment in 3 weeks once tests are completed  Please call our office 2 months in advance to schedule this appointment.  You may see Dr.Crenshaw or one of the following Advanced Practice Providers on your designated Care Team:   Kerin Ransom, PA-C Roby Lofts, [place] . Sande Rives, PA-C

## 2018-07-04 ENCOUNTER — Encounter: Payer: Self-pay | Admitting: Physician Assistant

## 2018-07-08 ENCOUNTER — Telehealth (HOSPITAL_COMMUNITY): Payer: Self-pay

## 2018-07-08 NOTE — Telephone Encounter (Signed)
Encounter complete. 

## 2018-07-09 ENCOUNTER — Ambulatory Visit (HOSPITAL_COMMUNITY): Payer: PPO | Attending: Cardiology

## 2018-07-09 DIAGNOSIS — I358 Other nonrheumatic aortic valve disorders: Secondary | ICD-10-CM

## 2018-07-09 DIAGNOSIS — R0609 Other forms of dyspnea: Secondary | ICD-10-CM | POA: Diagnosis not present

## 2018-07-09 DIAGNOSIS — R0600 Dyspnea, unspecified: Secondary | ICD-10-CM

## 2018-07-10 ENCOUNTER — Ambulatory Visit (HOSPITAL_COMMUNITY)
Admission: RE | Admit: 2018-07-10 | Discharge: 2018-07-10 | Disposition: A | Discharge status: Home / Self Care | Payer: PPO | Source: Ambulatory Visit | Attending: Cardiology | Admitting: Cardiology

## 2018-07-10 DIAGNOSIS — I358 Other nonrheumatic aortic valve disorders: Secondary | ICD-10-CM | POA: Diagnosis not present

## 2018-07-10 DIAGNOSIS — R0600 Dyspnea, unspecified: Secondary | ICD-10-CM

## 2018-07-10 DIAGNOSIS — R0609 Other forms of dyspnea: Secondary | ICD-10-CM | POA: Diagnosis not present

## 2018-07-10 LAB — MYOCARDIAL PERFUSION IMAGING
LV dias vol: 87 mL (ref 46–106)
LV sys vol: 28 mL
Peak HR: 85 {beats}/min
Rest HR: 68 {beats}/min
SDS: 2
SRS: 0
SSS: 2
TID: 1.04

## 2018-07-10 MED ORDER — TECHNETIUM TC 99M TETROFOSMIN IV KIT
9.6000 | PACK | Freq: Once | INTRAVENOUS | Status: AC | PRN
  Administered 2018-07-10: 9.6 via INTRAVENOUS
  Filled 2018-07-10: qty 10

## 2018-07-10 MED ORDER — REGADENOSON 0.4 MG/5ML IV SOLN
0.4000 mg | Freq: Once | INTRAVENOUS | Status: AC
  Administered 2018-07-10: 0.4 mg via INTRAVENOUS

## 2018-07-10 MED ORDER — TECHNETIUM TC 99M TETROFOSMIN IV KIT
30.0000 | PACK | Freq: Once | INTRAVENOUS | Status: AC | PRN
  Administered 2018-07-10: 30 via INTRAVENOUS
  Filled 2018-07-10: qty 30

## 2018-07-11 ENCOUNTER — Ambulatory Visit: Payer: Medicare Other | Admitting: Family

## 2018-07-14 ENCOUNTER — Ambulatory Visit (INDEPENDENT_AMBULATORY_CARE_PROVIDER_SITE_OTHER): Payer: PPO | Admitting: Family Medicine

## 2018-07-14 ENCOUNTER — Encounter: Payer: Self-pay | Admitting: Family Medicine

## 2018-07-14 VITALS — BP 134/62 | HR 69 | Temp 97.5°F | Ht 63.0 in | Wt 190.1 lb

## 2018-07-14 DIAGNOSIS — Z1239 Encounter for other screening for malignant neoplasm of breast: Secondary | ICD-10-CM | POA: Diagnosis not present

## 2018-07-14 DIAGNOSIS — Z7689 Persons encountering health services in other specified circumstances: Secondary | ICD-10-CM

## 2018-07-14 DIAGNOSIS — R0600 Dyspnea, unspecified: Secondary | ICD-10-CM

## 2018-07-14 DIAGNOSIS — R0609 Other forms of dyspnea: Secondary | ICD-10-CM | POA: Diagnosis not present

## 2018-07-14 DIAGNOSIS — E2839 Other primary ovarian failure: Secondary | ICD-10-CM

## 2018-07-14 NOTE — Patient Instructions (Signed)
Please stop at the desk and schedule your bone density and mammogram tests  Follow up for an Annual Wellness Visit and complete physical with me in approximately 8 weeks  Join the gym- start with twice a week, walk or clean house on the other days!

## 2018-07-14 NOTE — Progress Notes (Signed)
Subjective:    Patient ID: [name] [name], female    DOB: 18-Jan-1951, 68 y.o.   MRN: [id number]  HPI This is a 68 yo female who presents today to transfer care from [facility] She is married. 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 Some adjustment struggles. 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    Last CPE- unknown Mammo- 03/20/17 Pap- gyn,  Colonoscopy- 05/30/11, 10 year recall  Tdap- 05/09/11 Flu-annual Eye- annual Dental- full dentures Exercise- not regular, can join gym and has been considering Diet- does shopping and cooking, rarely eats out, no soda/juice/sweet tea, likes carbs  DOE- recent work up by cardiology. Negative Myoview and Echo. Patient thinks related to deconditioning. She denies any concerns today, just establishing care closer to home since she is no longer working.   Past Medical History:  Diagnosis Date  . Acquired deformity of right foot   . CAD (coronary artery disease) primary cardioloigst-  dr Stanford Breed   hx NSTEMI 10-24-2001  s/p  cardiac cath w/ PCI and DES x1 to first diagonal  . Chronic constipation   . Depression   . Fatty liver 04/26/2015  . GERD (gastroesophageal reflux disease)   . History of abnormal cervical Pap smear    ACUS 2012  . History of exercise stress test 05-25-2014  dr Stanford Breed   borderline (indeterminate) with non-specific ST changes (mild ST depression in leads II,III, aVF, & V6, did not meet criteria for ischemia) ;  pt had low intolerence, no cp, normal heartrate and bp response, no arrhythmias  . History of goiter    as child s/p  removal ,  per pt benign , neck area  (not thyroid)  . History of non-ST elevation myocardial infarction (NSTEMI) 10/24/2001   s/p  PCI and DES to first diagonal  . Hyperlipidemia   . Hypertension   . OSA on CPAP pulmoloigst-  dr young   per study 11-07-2004  severe osa  . Right foot pain   . S/P drug eluting coronary stent placement 10/27/2001   x1 to  first diagonal   . Seasonal and perennial allergic rhinitis   . Type 2 diabetes mellitus ([place])   . Vitamin D deficiency   . Wears dentures    upper and lower partial denture  . Wears glasses    Past Surgical History:  Procedure Laterality Date  . BUNIONECTOMY Left 2012  . BUNIONECTOMY Right 04/19/2017   Procedure: RIGHT BUNIONECTOMY;  Surgeon: Rosemary Holms, DPM;  Location: [facility];  Service: Podiatry;  Laterality: Right;  . CARDIAC CATHETERIZATION  09-11-2002   dr Lia Foyer   well-preserved LVF; continued patency previouly placed stent in diagonal branch;  mild luminal irregularities   . CARDIOVASCULAR STRESS TEST  05-16-2011   dr Stanford Breed   normal nuclear study w/ no evidence ischemia/  normal LV function and wall motion , ef 77%  . CORONARY ANGIOPLASTY WITH STENT PLACEMENT  10-27-2001   dr Lia Foyer   high-grade stenosis first diagonal post PCI and DES (TAXUS);  mild luminal irregularity throughout LAD, RCA, and very mild CFx;  preserved LVF  . goiter removal  1968   per pt benign tumor removed from neck beside throat  . HALLUX VALGUS LAPIDUS Right 04/19/2017   Procedure: HALLUX VALGUS LAPIDUS FUSION;  Surgeon: Rosemary Holms, DPM;  Location: [place];  Service: Podiatry;  Laterality: Right;  . HAMMER TOE SURGERY Right 04/19/2017   Procedure: HAMMER TOE CORRECTION RIGHT  2ND;  Surgeon: Rosemary Holms, DPM;  Location: [facility];  Service: Podiatry;  Laterality: Right;  . METATARSAL OSTEOTOMY Right 04/19/2017   Procedure: 2ND METATARSAL OSTEOTOMY;  Surgeon: Rosemary Holms, DPM;  Location: [place];  Service: Podiatry;  Laterality: Right;  . TONSILLECTOMY AND ADENOIDECTOMY  age 52  . TUBAL LIGATION Bilateral 1985   Family History  Problem Relation Age of Onset  . Cancer Mother        throat, cervical  . Diabetes Mother   . Stroke Mother   . Heart attack Mother   . Heart disease Mother   . Hypertension Mother   .  Thyroid disease Mother   . Alcoholism Mother   . Cancer Father        prostate  . Diabetes Sister   . Heart attack Sister   . Hypertension Sister   . Hyperlipidemia Sister   . Colon cancer Neg Hx   . Breast cancer Neg Hx    Social History   Tobacco Use  . Smoking status: Former Smoker    Years: 46.00    Types: Cigarettes    Last attempt to quit: 09/23/2012    Years since quitting: 5.8  . Smokeless tobacco: Never Used  Substance Use Topics  . Alcohol use: Yes    Alcohol/week: 0.0 standard drinks    Comment: occasional  . Drug use: No      Review of Systems Per HPI    Objective:   Physical Exam Vitals signs reviewed.  Constitutional:      General: She is not in acute distress.    Appearance: Normal appearance. She is obese. She is not ill-appearing or toxic-appearing.  Eyes:     Conjunctiva/sclera: Conjunctivae normal.  Cardiovascular:     Rate and Rhythm: Normal rate and regular rhythm.     Heart sounds: Murmur present.  Pulmonary:     Effort: Pulmonary effort is normal.     Breath sounds: Normal breath sounds.  Musculoskeletal:        General: No swelling (LE).  Skin:    General: Skin is warm and dry.  Neurological:     Mental Status: She is alert and oriented to person, place, and time.  Psychiatric:        Mood and Affect: Mood normal.        Behavior: Behavior normal.        Thought Content: Thought content normal.        Judgment: Judgment normal.       BP 134/62   Pulse 69   Temp (!) 97.5 F (36.4 C) (Oral)   Ht 5\' 3"  (1.6 m)   Wt 190 lb 1.9 oz (86.2 kg)   SpO2 97%   BMI 33.68 kg/m  Wt Readings from Last 3 Encounters:  07/14/18 190 lb 1.9 oz (86.2 kg)  07/10/18 190 lb (86.2 kg)  07/02/18 190 lb (86.2 kg)       Assessment & Plan:  1. Encounter to establish care -- Discussed and encouraged healthy lifestyle choices- adequate sleep, regular exercise, stress management and healthy food choices.  - follow up for labs/cpe/AWV in 2  months  2. Screening for breast cancer - MM Digital Screening; Future  3. Estrogen deficiency - DG Bone Density; Future  4. DOE (dyspnea on exertion) - reassuring cardiac work up, patient with decreased activity since retiring 4 months ago - encouraged her to participate in [place] at least 2x/ week, try to walk  for 10-15 minutes other days   Clarene Reamer, FNP-BC  [facility] [place]  07/14/2018 5:33 PM

## 2018-07-17 ENCOUNTER — Encounter: Payer: Self-pay | Admitting: Family Medicine

## 2018-07-18 MED ORDER — PANTOPRAZOLE SODIUM 40 MG PO TBEC: 40.0000 mg | DELAYED_RELEASE_TABLET | Freq: Every morning | ORAL | 5 refills | Status: DC

## 2018-07-21 ENCOUNTER — Ambulatory Visit (INDEPENDENT_AMBULATORY_CARE_PROVIDER_SITE_OTHER)
Admission: RE | Admit: 2018-07-21 | Discharge: 2018-07-21 | Disposition: A | Discharge status: Home / Self Care | Payer: PPO | Source: Ambulatory Visit | Attending: Family Medicine | Admitting: Family Medicine

## 2018-07-21 DIAGNOSIS — E2839 Other primary ovarian failure: Secondary | ICD-10-CM | POA: Diagnosis not present

## 2018-07-29 ENCOUNTER — Encounter: Payer: Self-pay | Admitting: Physician Assistant

## 2018-07-29 ENCOUNTER — Ambulatory Visit: Payer: PPO | Admitting: Physician Assistant

## 2018-07-29 ENCOUNTER — Ambulatory Visit (INDEPENDENT_AMBULATORY_CARE_PROVIDER_SITE_OTHER): Payer: PPO | Admitting: Physician Assistant

## 2018-07-29 VITALS — BP 147/71 | HR 62 | Ht 63.0 in | Wt 192.4 lb

## 2018-07-29 DIAGNOSIS — G4733 Obstructive sleep apnea (adult) (pediatric): Secondary | ICD-10-CM | POA: Diagnosis not present

## 2018-07-29 DIAGNOSIS — I10 Essential (primary) hypertension: Secondary | ICD-10-CM

## 2018-07-29 DIAGNOSIS — E785 Hyperlipidemia, unspecified: Secondary | ICD-10-CM

## 2018-07-29 DIAGNOSIS — E119 Type 2 diabetes mellitus without complications: Secondary | ICD-10-CM | POA: Diagnosis not present

## 2018-07-29 DIAGNOSIS — I2510 Atherosclerotic heart disease of native coronary artery without angina pectoris: Secondary | ICD-10-CM | POA: Diagnosis not present

## 2018-07-29 NOTE — Progress Notes (Signed)
Cardiology Office Note    Date:  07/31/2018   ID:  [name], [name] Feb 09, 1951, MRN [id number]  PCP:  Elby Beck, FNP  Cardiologist:  Dr. Stanford Breed   Chief Complaint  Patient presents with  . Follow-up    seen for Dr. Stanford Breed.     History of Present Illness:  [name] [name] is a 68 y.o. female with PMH of CAD, hyperlipidemia, history of fatty liver disease, hypertension, obstructive sleep apnea and DM2.  Patient had prior history of PCI to the first diagonal in May 2003 in the setting of NSTEMI.  Her most recent cardiac catheterization in April 2004 showed no other significant obstructive disease, normal EF.  Myoview in December 2012 was normal.  ETT in December 2015 showed borderline changes in the inferior lead.  Dr. Stanford Breed last saw the patient in March 2019, she was having dyspnea with more extreme activities but not with routine activity.  She was seen by her PCP in December 2019 for fatigue.  It was suspected patient may had a virus.  Her blood pressure was elevated, Toprol-XL was increased to 100 mg daily.  Amlodipine was increased to 10 mg daily.  TSH was normal.  His CBC did not show any sign of anemia.  I last saw the patient on 07/02/2018 for evaluation of increasing shortness of breath.  Her dyspnea mainly occurred with exertion and denies any chest pain.  Echocardiogram obtained on 07/09/2018 showed EF 55 to 60%, small pericardial effusion otherwise no significant valvular issue.  Myoview obtained on the following day on 07/10/2018 was low risk with normal perfusion.  Patient presents today for cardiology office visit.  She still has some dyspnea on exertion however improved compared to before.  I recommend she increase activity level.  On physical exam she appears to be euvolemic.  She has no lower extremity edema, orthopnea or PND.  Her blood pressure is mildly elevated today, however normally her blood pressures in the 130s range.  I will continue her on the current blood  pressure medication.  She is scheduled to be set up with a new primary care provider that is closer to where she lives.   Past Medical History:  Diagnosis Date  . Acquired deformity of right foot   . CAD (coronary artery disease) primary cardioloigst-  dr Stanford Breed   hx NSTEMI 10-24-2001  s/p  cardiac cath w/ PCI and DES x1 to first diagonal  . Chronic constipation   . Depression   . Fatty liver 04/26/2015  . GERD (gastroesophageal reflux disease)   . History of abnormal cervical Pap smear    ACUS 2012  . History of exercise stress test 05-25-2014  dr Stanford Breed   borderline (indeterminate) with non-specific ST changes (mild ST depression in leads II,III, aVF, & V6, did not meet criteria for ischemia) ;  pt had low intolerence, no cp, normal heartrate and bp response, no arrhythmias  . History of goiter    as child s/p  removal ,  per pt benign , neck area  (not thyroid)  . History of non-ST elevation myocardial infarction (NSTEMI) 10/24/2001   s/p  PCI and DES to first diagonal  . Hyperlipidemia   . Hypertension   . OSA on CPAP pulmoloigst-  dr young   per study 11-07-2004  severe osa  . Right foot pain   . S/P drug eluting coronary stent placement 10/27/2001   x1 to first diagonal   . Seasonal and perennial allergic  rhinitis   . Type 2 diabetes mellitus ([place])   . Vitamin D deficiency   . Wears dentures    upper and lower partial denture  . Wears glasses     Past Surgical History:  Procedure Laterality Date  . BUNIONECTOMY Left 2012  . BUNIONECTOMY Right 04/19/2017   Procedure: RIGHT BUNIONECTOMY;  Surgeon: Rosemary Holms, DPM;  Location: [facility];  Service: Podiatry;  Laterality: Right;  . CARDIAC CATHETERIZATION  09-11-2002   dr Lia Foyer   well-preserved LVF; continued patency previouly placed stent in diagonal branch;  mild luminal irregularities   . CARDIOVASCULAR STRESS TEST  05-16-2011   dr Stanford Breed   normal nuclear study w/ no evidence ischemia/   normal LV function and wall motion , ef 77%  . CORONARY ANGIOPLASTY WITH STENT PLACEMENT  10-27-2001   dr Lia Foyer   high-grade stenosis first diagonal post PCI and DES (TAXUS);  mild luminal irregularity throughout LAD, RCA, and very mild CFx;  preserved LVF  . goiter removal  1968   per pt benign tumor removed from neck beside throat  . HALLUX VALGUS LAPIDUS Right 04/19/2017   Procedure: HALLUX VALGUS LAPIDUS FUSION;  Surgeon: Rosemary Holms, DPM;  Location: [place];  Service: Podiatry;  Laterality: Right;  . HAMMER TOE SURGERY Right 04/19/2017   Procedure: HAMMER TOE CORRECTION RIGHT 2ND;  Surgeon: Rosemary Holms, DPM;  Location: [facility];  Service: Podiatry;  Laterality: Right;  . METATARSAL OSTEOTOMY Right 04/19/2017   Procedure: 2ND METATARSAL OSTEOTOMY;  Surgeon: Rosemary Holms, DPM;  Location: [place];  Service: Podiatry;  Laterality: Right;  . TONSILLECTOMY AND ADENOIDECTOMY  age 65  . TUBAL LIGATION Bilateral 1985    Current Medications: Outpatient Medications Prior to Visit  Medication Sig Dispense Refill  . amLODipine (NORVASC) 10 MG tablet Take 1 tablet (10 mg total) by mouth daily. 90 tablet 1  . aspirin 81 MG tablet Take 81 mg by mouth daily.      Marland Kitchen buPROPion (WELLBUTRIN XL) 150 MG 24 hr tablet Take 450 mg every morning by mouth.     . Calcium Carbonate-Vitamin D (CALTRATE 600+D) 600-400 MG-UNIT per tablet Take 2 tablets 2 (two) times daily by mouth.     . Cholecalciferol (VITAMIN D3) 3000 units TABS Take 1 tablet by mouth daily. 30 tablet   . glucose blood (ONE TOUCH ULTRA TEST) test strip 1 each by Other route 3 (three) times daily. for testing 100 each 12  . Insulin Pen Needle (BD PEN NEEDLE NANO U/F) 32G X 4 MM MISC USE ONCE DAILY WITH INSULIN INJECTIONS 100 each 1  . Lancets (ONETOUCH ULTRASOFT) lancets TEST BLOOD SUGAR ONCE DAILY 100 each 0  . liraglutide (VICTOZA) 18 MG/3ML SOPN ADMINISTER 1.8 MG UNDER THE SKIN  DAILY 27 mL 1  . meloxicam (MOBIC) 7.5 MG tablet Take by mouth.    . metFORMIN (GLUCOPHAGE-XR) 750 MG 24 hr tablet Take 1 tablet (750 mg total) by mouth daily with breakfast. 180 tablet 1  . metoprolol succinate (TOPROL-XL) 100 MG 24 hr tablet Take 1 tablet (100 mg total) by mouth daily. Take with or immediately following a meal. 90 tablet 1  . Multiple Vitamins-Minerals (MULTIVITAMIN ADULTS 50+ PO) Take 1 tablet every morning by mouth.     . olmesartan-hydrochlorothiazide (BENICAR HCT) 40-12.5 MG tablet Take 1 tablet by mouth daily. 90 tablet 3  . pantoprazole (PROTONIX) 40 MG tablet Take 1 tablet (40 mg total) by mouth every morning.  30 tablet 5  . Probiotic Product (PROBIOTIC DAILY PO) Take 1 capsule every morning by mouth.     . rosuvastatin (CRESTOR) 20 MG tablet TAKE 1 TABLET BY MOUTH ONCE DAILY 90 tablet 0   No facility-administered medications prior to visit.      Allergies:   Patient has no known allergies.   Social History   Socioeconomic History  . Marital status: Married    Spouse name: Not on file  . Number of children: 3  . Years of education: Not on file  . Highest education level: Not on file  Occupational History  . Occupation: Air cabin crew    Comment: credit Engineer, drilling: [employer]  Social Needs  . Financial resource strain: Not on file  . Food insecurity:    Worry: Not on file    Inability: Not on file  . Transportation needs:    Medical: Not on file    Non-medical: Not on file  Tobacco Use  . Smoking status: Former Smoker    Years: 46.00    Types: Cigarettes    Last attempt to quit: 09/23/2012    Years since quitting: 5.8  . Smokeless tobacco: Never Used  Substance and Sexual Activity  . Alcohol use: Yes    Alcohol/week: 0.0 standard drinks    Comment: occasional  . Drug use: No  . Sexual activity: Not on file  Lifestyle  . Physical activity:    Days per week: Not on file    Minutes per session: Not on file  .  Stress: Not on file  Relationships  . Social connections:    Talks on phone: Not on file    Gets together: Not on file    Attends religious service: Not on file    Active member of club or organization: Not on file    Attends meetings of clubs or organizations: Not on file    Relationship status: Not on file  Other Topics Concern  . Not on file  Social History Narrative   Caffeine Use:  2 cups coffee daily   Regular exercise:  No        Family History:  The patient's family history includes Alcoholism in her mother; Cancer in her father and mother; Diabetes in her mother and sister; Heart attack in her mother and sister; Heart disease in her mother; Hyperlipidemia in her sister; Hypertension in her mother and sister; Stroke in her mother; Thyroid disease in her mother.   ROS:   Please see the history of present illness.    ROS All other systems reviewed and are negative.   PHYSICAL EXAM:   VS:  BP (!) 147/71   Pulse 62   Ht 5\' 3"  (1.6 m)   Wt 192 lb 6.4 oz (87.3 kg)   BMI 34.08 kg/m    GEN: Well nourished, well developed, in no acute distress  HEENT: normal  Neck: no JVD, carotid bruits, or masses Cardiac: RRR; no murmurs, rubs, or gallops,no edema  Respiratory:  clear to auscultation bilaterally, normal work of breathing GI: soft, nontender, nondistended, + BS MS: no deformity or atrophy  Skin: warm and dry, no rash Neuro:  Alert and Oriented x 3, Strength and sensation are intact Psych: euthymic mood, full affect  Wt Readings from Last 3 Encounters:  07/29/18 192 lb 6.4 oz (87.3 kg)  07/14/18 190 lb 1.9 oz (86.2 kg)  07/10/18 190 lb (86.2 kg)  Studies/Labs Reviewed:   EKG:  EKG is not ordered today.    Recent Labs: 06/09/2018: ALT 37; BUN 17; Creatinine, Ser 0.84; Hemoglobin 13.1; Platelets 192.0; Potassium 3.9; Sodium 143; TSH 0.93   Lipid Panel    Component Value Date/Time   CHOL 115 08/29/2017 0850   TRIG 162 (H) 08/29/2017 0850   HDL 50  08/29/2017 0850   CHOLHDL 2.3 08/29/2017 0850   CHOLHDL 2 05/23/2016 0957   VLDL 24.8 05/23/2016 0957   LDLCALC 33 08/29/2017 0850   LDLDIRECT 77.7 12/27/2009 0942    Additional studies/ records that were reviewed today include:    Echo 07/09/2018 IMPRESSIONS    1. The left ventricle appears to be normal in size, have normal wall thickness, with normal systolic function of 16-10%. Echo evidence of impaired relaxation in diastolic filling patterns.  2. Right ventricular systolic pressure is is mildly elevated.  3. The right ventricle is normal in size, has normal wall thickness and normal systolic function.  4. Normal left atrial size.  5. Normal right atrial size.  6. Small pericardial effusion, as described above.  7. The pericardial effusion is globally pericardial effusion.  8. Mitral valve regurgitation is trivial by color flow Doppler.  9. The mitral valve normal in structure and function. 10. Normal tricuspid valve. 11. Aortic valve normal. 12. The inferior vena cava was normal in size <50% respiratory variablity. 13. No atrial level shunt detected by color flow Doppler.   Myoview 07/10/2018 Study Highlights     Nuclear stress EF: 67%.  The left ventricular ejection fraction is hyperdynamic (>65%).  There was no ST segment deviation noted during stress.  No T wave inversion was noted during stress.  Defect 1: There is a small defect of mild severity present in the mid anterior location.  The study is normal.  This is a low risk study.   Low risk stress nuclear study with normal perfusion and normal left ventricular regional and global systolic function.       ASSESSMENT:    1. Coronary artery disease involving native coronary artery of native heart without angina pectoris   2. Hyperlipidemia LDL goal <70   3. Essential hypertension   4. OSA (obstructive sleep apnea)   5. Controlled type 2 diabetes mellitus without complication, without long-term  current use of insulin (HCC)      PLAN:  In order of problems listed above:  1. Dyspnea: Echocardiogram and Myoview were reassuring.  I recommend he increase activity level.  No further work-up is necessary  2. CAD: On aspirin and statin.  Denies any recent chest pain  3. Hypertension: Blood pressure stable  4. DM2: Managed by primary care provider    Medication Adjustments/Labs and Tests Ordered: Current medicines are reviewed at length with the patient today.  Concerns regarding medicines are outlined above.  Medication changes, Labs and Tests ordered today are listed in the Patient Instructions below. Patient Instructions  Medication Instructions:   Your physician recommends that you continue on your current medications as directed. Please refer to the Current Medication list given to you today.  If you need a refill on your cardiac medications before your next appointment, please call your pharmacy.   Lab work: NONE  If you have labs (blood work) drawn today and your tests are completely normal, you will receive your results only by: Marland Kitchen MyChart Message (if you have MyChart) OR . A paper copy in the mail If you have any lab test that is  abnormal or we need to change your treatment, we will call you to review the results.  Testing/Procedures:  NONE  Follow-Up: At [facility], you and your health needs are our priority.  As part of our continuing mission to provide you with exceptional heart care, we have created designated Provider Care Teams.  These Care Teams include your primary Cardiologist (physician) and Advanced Practice Providers (APPs -  Physician Assistants and Nurse Practitioners) who all work together to provide you with the care you need, when you need it. . You will need a follow up appointment in 6 months (AUGUST 2020).  Please call our office 2 months in June 2020 to schedule this appointment with Kirk Ruths, MD   Any Other Special Instructions Will  Be Listed Below (If Applicable).       Hilbert Corrigan, [place]  07/31/2018 11:51 PM    [facility] [address], [place], [place]  [address] Phone: [phone]; Fax: [phone]

## 2018-07-29 NOTE — Patient Instructions (Signed)
Medication Instructions:   Your physician recommends that you continue on your current medications as directed. Please refer to the Current Medication list given to you today.  If you need a refill on your cardiac medications before your next appointment, please call your pharmacy.   Lab work: NONE  If you have labs (blood work) drawn today and your tests are completely normal, you will receive your results only by: Marland Kitchen MyChart Message (if you have MyChart) OR . A paper copy in the mail If you have any lab test that is abnormal or we need to change your treatment, we will call you to review the results.  Testing/Procedures:  NONE  Follow-Up: At [facility], you and your health needs are our priority.  As part of our continuing mission to provide you with exceptional heart care, we have created designated Provider Care Teams.  These Care Teams include your primary Cardiologist (physician) and Advanced Practice Providers (APPs -  Physician Assistants and Nurse Practitioners) who all work together to provide you with the care you need, when you need it. . You will need a follow up appointment in 6 months (AUGUST 2020).  Please call our office 2 months in June 2020 to schedule this appointment with Kirk Ruths, MD   Any Other Special Instructions Will Be Listed Below (If Applicable).

## 2018-07-31 ENCOUNTER — Encounter: Payer: Self-pay | Admitting: Physician Assistant

## 2018-07-31 DIAGNOSIS — E119 Type 2 diabetes mellitus without complications: Secondary | ICD-10-CM | POA: Diagnosis not present

## 2018-07-31 LAB — HM DIABETES EYE EXAM

## 2018-07-31 NOTE — Addendum Note (Signed)
Addended byEulas Post, Jaser Fullen on: 07/31/2018 11:54 PM   Modules accepted: Level of Service

## 2018-08-06 ENCOUNTER — Encounter: Payer: Self-pay | Admitting: Family Medicine

## 2018-08-12 ENCOUNTER — Ambulatory Visit
Admission: RE | Admit: 2018-08-12 | Discharge: 2018-08-12 | Disposition: A | Discharge status: Home / Self Care | Payer: PPO | Source: Ambulatory Visit | Attending: Family Medicine | Admitting: Family Medicine

## 2018-08-12 DIAGNOSIS — Z1231 Encounter for screening mammogram for malignant neoplasm of breast: Secondary | ICD-10-CM | POA: Diagnosis not present

## 2018-08-12 DIAGNOSIS — Z1239 Encounter for other screening for malignant neoplasm of breast: Secondary | ICD-10-CM

## 2018-08-21 DIAGNOSIS — G4733 Obstructive sleep apnea (adult) (pediatric): Secondary | ICD-10-CM | POA: Diagnosis not present

## 2018-08-28 ENCOUNTER — Other Ambulatory Visit: Payer: Self-pay | Admitting: Cardiology

## 2018-08-28 DIAGNOSIS — E7800 Pure hypercholesterolemia, unspecified: Secondary | ICD-10-CM

## 2018-09-08 ENCOUNTER — Ambulatory Visit: Payer: PPO

## 2018-09-10 ENCOUNTER — Encounter: Payer: PPO | Admitting: Family Medicine

## 2018-09-28 ENCOUNTER — Other Ambulatory Visit: Payer: Self-pay | Admitting: Family

## 2018-09-28 DIAGNOSIS — E119 Type 2 diabetes mellitus without complications: Secondary | ICD-10-CM

## 2018-09-29 ENCOUNTER — Other Ambulatory Visit: Payer: Self-pay

## 2018-09-29 MED ORDER — BUPROPION HCL ER (XL) 150 MG PO TB24: 450.0000 mg | ORAL_TABLET | Freq: Every morning | ORAL | 2 refills | Status: DC

## 2018-09-30 ENCOUNTER — Other Ambulatory Visit: Payer: Self-pay | Admitting: Family

## 2018-10-01 MED ORDER — ROSUVASTATIN CALCIUM 20 MG PO TABS: 20.0000 mg | ORAL_TABLET | Freq: Every day | ORAL | 3 refills | Status: DC

## 2018-10-03 ENCOUNTER — Other Ambulatory Visit: Payer: Self-pay | Admitting: Family Medicine

## 2018-10-03 DIAGNOSIS — E669 Obesity, unspecified: Secondary | ICD-10-CM

## 2018-10-03 DIAGNOSIS — E785 Hyperlipidemia, unspecified: Secondary | ICD-10-CM

## 2018-10-03 DIAGNOSIS — E1169 Type 2 diabetes mellitus with other specified complication: Secondary | ICD-10-CM

## 2018-10-03 DIAGNOSIS — K760 Fatty (change of) liver, not elsewhere classified: Secondary | ICD-10-CM

## 2018-10-06 ENCOUNTER — Other Ambulatory Visit (INDEPENDENT_AMBULATORY_CARE_PROVIDER_SITE_OTHER): Payer: PPO

## 2018-10-06 ENCOUNTER — Other Ambulatory Visit: Payer: Self-pay

## 2018-10-06 DIAGNOSIS — E785 Hyperlipidemia, unspecified: Secondary | ICD-10-CM

## 2018-10-06 DIAGNOSIS — K760 Fatty (change of) liver, not elsewhere classified: Secondary | ICD-10-CM | POA: Diagnosis not present

## 2018-10-06 DIAGNOSIS — E669 Obesity, unspecified: Secondary | ICD-10-CM

## 2018-10-06 DIAGNOSIS — E1169 Type 2 diabetes mellitus with other specified complication: Secondary | ICD-10-CM

## 2018-10-06 LAB — COMPREHENSIVE METABOLIC PANEL
ALT: 41 U/L — ABNORMAL HIGH (ref 0–35)
AST: 25 U/L (ref 0–37)
Albumin: 4.1 g/dL (ref 3.5–5.2)
Alkaline Phosphatase: 53 U/L (ref 39–117)
BUN: 17 mg/dL (ref 6–23)
CO2: 30 mEq/L (ref 19–32)
Calcium: 9.3 mg/dL (ref 8.4–10.5)
Chloride: 103 mEq/L (ref 96–112)
Creatinine, Ser: 0.79 mg/dL (ref 0.40–1.20)
GFR: 72.22 mL/min (ref >60.00)
Glucose, Bld: 105 mg/dL — ABNORMAL HIGH (ref 70–99)
Potassium: 3.8 mEq/L (ref 3.5–5.1)
Sodium: 140 mEq/L (ref 135–145)
Total Bilirubin: 0.6 mg/dL (ref 0.2–1.2)
Total Protein: 6.5 g/dL (ref 6.0–8.3)

## 2018-10-06 LAB — LIPID PANEL
Cholesterol: 100 mg/dL (ref 0–200)
HDL: 46.1 mg/dL (ref >39.00)
LDL Cholesterol: 23 mg/dL (ref 0–99)
NonHDL: 53.56
Total CHOL/HDL Ratio: 2
Triglycerides: 154 mg/dL — ABNORMAL HIGH (ref 0.0–149.0)
VLDL: 30.8 mg/dL (ref 0.0–40.0)

## 2018-10-06 LAB — HEMOGLOBIN A1C: Hgb A1c MFr Bld: 6.2 % (ref 4.6–6.5)

## 2018-10-08 ENCOUNTER — Encounter: Payer: Self-pay | Admitting: Family Medicine

## 2018-10-08 ENCOUNTER — Ambulatory Visit (INDEPENDENT_AMBULATORY_CARE_PROVIDER_SITE_OTHER): Payer: PPO | Admitting: Family Medicine

## 2018-10-08 VITALS — BP 130/63 | HR 68 | Ht 63.0 in | Wt 193.6 lb

## 2018-10-08 DIAGNOSIS — K219 Gastro-esophageal reflux disease without esophagitis: Secondary | ICD-10-CM | POA: Diagnosis not present

## 2018-10-08 DIAGNOSIS — E119 Type 2 diabetes mellitus without complications: Secondary | ICD-10-CM

## 2018-10-08 DIAGNOSIS — Z794 Long term (current) use of insulin: Secondary | ICD-10-CM | POA: Diagnosis not present

## 2018-10-08 DIAGNOSIS — E785 Hyperlipidemia, unspecified: Secondary | ICD-10-CM

## 2018-10-08 DIAGNOSIS — M25472 Effusion, left ankle: Secondary | ICD-10-CM | POA: Diagnosis not present

## 2018-10-08 NOTE — Progress Notes (Signed)
Virtual Visit via Video Note  I connected with [name] [name] on 10/08/18 at 10:40 AM EDT by a video enabled telemedicine application and verified that I am speaking with the correct person using two identifiers. She was in her home and I was in my office at [facility].    I discussed the limitations of evaluation and management by telemedicine and the availability of in person appointments. The patient expressed understanding and agreed to proceed.  History of Present Illness: This is a 68 yo female who agrees to a virtual visit for follow up of chronic medical conditions. She reports that she has been doing ok. Spending time helping her daughter with her grandchildren. She had labs drawn prior to visit today.   Diabetes mellitus type 2- hemoglobin A1C 2 days ago was 6.2. Eating more during Corona virus pandemic, checking sugars at home, running 150-160. She has not been exercising as much since her gym closed.   GERD- has been on pantoprazole for long time. Over last couple of months has "gnawing," feeling in stomach and occasional indigestion. No nausea. No weight loss. Good relief with Tums. Some worsening with certain foods. Seems to get better as the day goes on and with eating more.   Hypercholesterolemia- LDL 23, total cholesterol 100, HDL 46 on rosuvastatin 20 mg.   Left ankle swelling- chronic, no pain, worse as day progresses and with increased sitting. Thinks she had surgery on ankle in the past.   ROS- she denies chest pain, shortness of breath, fever, cough Past Medical History:  Diagnosis Date  . Acquired deformity of right foot   . CAD (coronary artery disease) primary cardioloigst-  dr Stanford Breed   hx NSTEMI 10-24-2001  s/p  cardiac cath w/ PCI and DES x1 to first diagonal  . Chronic constipation   . Depression   . Fatty liver 04/26/2015  . GERD (gastroesophageal reflux disease)   . History of abnormal cervical Pap smear    ACUS 2012  . History of exercise stress test  05-25-2014  dr Stanford Breed   borderline (indeterminate) with non-specific ST changes (mild ST depression in leads II,III, aVF, & V6, did not meet criteria for ischemia) ;  pt had low intolerence, no cp, normal heartrate and bp response, no arrhythmias  . History of goiter    as child s/p  removal ,  per pt benign , neck area  (not thyroid)  . History of non-ST elevation myocardial infarction (NSTEMI) 10/24/2001   s/p  PCI and DES to first diagonal  . Hyperlipidemia   . Hypertension   . OSA on CPAP pulmoloigst-  dr young   per study 11-07-2004  severe osa  . Right foot pain   . S/P drug eluting coronary stent placement 10/27/2001   x1 to first diagonal   . Seasonal and perennial allergic rhinitis   . Type 2 diabetes mellitus ([place])   . Vitamin D deficiency   . Wears dentures    upper and lower partial denture  . Wears glasses    Past Surgical History:  Procedure Laterality Date  . BUNIONECTOMY Left 2012  . BUNIONECTOMY Right 04/19/2017   Procedure: RIGHT BUNIONECTOMY;  Surgeon: Rosemary Holms, DPM;  Location: [facility];  Service: Podiatry;  Laterality: Right;  . CARDIAC CATHETERIZATION  09-11-2002   dr Lia Foyer   well-preserved LVF; continued patency previouly placed stent in diagonal branch;  mild luminal irregularities   . CARDIOVASCULAR STRESS TEST  05-16-2011   dr Stanford Breed  normal nuclear study w/ no evidence ischemia/  normal LV function and wall motion , ef 77%  . CORONARY ANGIOPLASTY WITH STENT PLACEMENT  10-27-2001   dr Lia Foyer   high-grade stenosis first diagonal post PCI and DES (TAXUS);  mild luminal irregularity throughout LAD, RCA, and very mild CFx;  preserved LVF  . goiter removal  1968   per pt benign tumor removed from neck beside throat  . HALLUX VALGUS LAPIDUS Right 04/19/2017   Procedure: HALLUX VALGUS LAPIDUS FUSION;  Surgeon: Rosemary Holms, DPM;  Location: [place];  Service: Podiatry;  Laterality: Right;  . HAMMER TOE SURGERY  Right 04/19/2017   Procedure: HAMMER TOE CORRECTION RIGHT 2ND;  Surgeon: Rosemary Holms, DPM;  Location: [facility];  Service: Podiatry;  Laterality: Right;  . METATARSAL OSTEOTOMY Right 04/19/2017   Procedure: 2ND METATARSAL OSTEOTOMY;  Surgeon: Rosemary Holms, DPM;  Location: [place];  Service: Podiatry;  Laterality: Right;  . TONSILLECTOMY AND ADENOIDECTOMY  age 10  . TUBAL LIGATION Bilateral 1985   Family History  Problem Relation Age of Onset  . Cancer Mother        throat, cervical  . Diabetes Mother   . Stroke Mother   . Heart attack Mother   . Heart disease Mother   . Hypertension Mother   . Thyroid disease Mother   . Alcoholism Mother   . Cancer Father        prostate  . Diabetes Sister   . Heart attack Sister   . Hypertension Sister   . Hyperlipidemia Sister   . Colon cancer Neg Hx   . Breast cancer Neg Hx    Social History   Tobacco Use  . Smoking status: Former Smoker    Years: 46.00    Types: Cigarettes    Last attempt to quit: 09/23/2012    Years since quitting: 6.0  . Smokeless tobacco: Never Used  Substance Use Topics  . Alcohol use: Yes    Alcohol/week: 0.0 standard drinks    Comment: occasional  . Drug use: No      Observations/Objective: Patient is alert and answers questions appropriately.  Visible skin is unremarkable.  She is normally conversive with no obvious shortness of breath.  Mood and affect are appropriate. BP 130/63 Comment: patient reported, home reading  Pulse 68   Ht 5\' 3"  (1.6 m)   Wt 193 lb 9 oz (87.8 kg) Comment: per patient  BMI 34.29 kg/m  Wt Readings from Last 3 Encounters:  10/08/18 193 lb 9 oz (87.8 kg)  07/29/18 192 lb 6.4 oz (87.3 kg)  07/14/18 190 lb 1.9 oz (86.2 kg)   BP Readings from Last 3 Encounters:  10/08/18 130/63  07/29/18 (!) 147/71  07/14/18 134/62    Assessment and Plan: 1. Type 2 diabetes mellitus without complication, with long-term current use of insulin  (HCC) -Well-controlled on current regimen with hemoglobin A1c 6.2 -Recheck in 6 months  2. Hyperlipidemia, unspecified hyperlipidemia type -LDL at goal on rosuvastatin 20 mg  3. Gastroesophageal reflux disease, esophagitis presence not specified -Continue pantoprazole, avoid triggers and can use antacids as needed -If no improvement in 2 weeks she was instructed to notify me  4. Left ankle swelling -Pain with swelling without discoloration, encouraged her to elevate foot when sitting and can wear compression sock or brace if needed  -Follow-up in 6 months Clarene Reamer, FNP-BC  [facility]  10/08/2018 1:25  PM   Follow Up Instructions:    I discussed the assessment and treatment plan with the patient. The patient was provided an opportunity to ask questions and all were answered. The patient agreed with the plan and demonstrated an understanding of the instructions.   The patient was advised to call back or seek an in-person evaluation if the symptoms worsen or if the condition fails to improve as anticipated.   Elby Beck, FNP

## 2018-11-03 ENCOUNTER — Encounter: Payer: Self-pay | Admitting: Family Medicine

## 2018-11-04 ENCOUNTER — Encounter: Payer: Self-pay | Admitting: Family Medicine

## 2018-11-04 ENCOUNTER — Ambulatory Visit (INDEPENDENT_AMBULATORY_CARE_PROVIDER_SITE_OTHER): Payer: PPO | Admitting: Family Medicine

## 2018-11-04 DIAGNOSIS — L237 Allergic contact dermatitis due to plants, except food: Secondary | ICD-10-CM

## 2018-11-04 MED ORDER — TRIAMCINOLONE ACETONIDE 0.1 % EX CREA: 1.0000 "application " | TOPICAL_CREAM | Freq: Two times a day (BID) | CUTANEOUS | 1 refills | Status: DC

## 2018-11-04 NOTE — Patient Instructions (Signed)
Stay cool (hot conditions make itching worse) Try zyrtec 10 mg daily over the counter for itch (watch for sedation also)  Keep rash clean and dry with soap and water  Try the triamcinolone cream twice daily as needed  If worse or no improvement please alert me and I will send in oral prednisone (only if necessary) avoiding it for now because it will raise your blood sugar significantly   Look out for the offending plant in yard/close to house and have someone get rid of it if able  Also try to keep pets out of it Launder clothing/shoes/linens (anything that could come in contact with the plant oil)   Update if not starting to improve in a week or if worsening

## 2018-11-04 NOTE — Progress Notes (Signed)
Virtual Visit via Video Note  I connected with [name] [name] on 11/04/18 at 12:00 PM EDT by a video enabled telemedicine application and verified that I am speaking with the correct person using two identifiers.  Location: Patient: home Provider: office    I discussed the limitations of evaluation and management by telemedicine and the availability of in person appointments. The patient expressed understanding and agreed to proceed.  History of Present Illness: Pt presents with suspected poison ivy dermatitis   Was exposed fri/sat she thinks  First spot on back of R hand   Inside of elbows Both hips and abdomen  Patches of blisters    ? If got from a pet who is outdoors  Using a clear product otc Baking soda  clorox  Keeping it clean   She has had it before   Review of Systems  Constitutional: Negative for chills, fever, malaise/fatigue and weight loss.  HENT: Negative for sore throat.   Eyes: Negative for discharge and redness.  Respiratory: Negative for cough, shortness of breath and wheezing.   Cardiovascular: Negative for chest pain and palpitations.  Gastrointestinal: Negative for nausea.  Skin: Positive for itching and rash.  Neurological: Negative for dizziness and headaches.  Endo/Heme/Allergies: Positive for environmental allergies.    Patient Active Problem List   Diagnosis Date Noted  . Plant allergic contact dermatitis 11/04/2018  . Primary osteoarthritis of both first carpometacarpal joints 05/01/2018  . Class 2 obesity with serious comorbidity and body mass index (BMI) of 35.0 to 35.9 in adult 01/17/2017  . Other constipation 01/17/2017  . Essential hypertension 01/17/2017  . Type 2 diabetes mellitus without complication, with long-term current use of insulin ([place]) 12/20/2016  . Non-alcoholic fatty liver disease 12/20/2016  . Type 2 diabetes mellitus without complication, without long-term current use of insulin ([place]) 09/20/2016  . NAFLD  (nonalcoholic fatty liver disease) 09/20/2016  . Vitamin D deficiency 09/20/2016  . Class 1 obesity without serious comorbidity with body mass index (BMI) of 34.0 to 34.9 in adult 09/05/2016  . Left carpal tunnel syndrome 08/18/2015  . Fatty liver 04/26/2015  . Onychomycosis 02/03/2014  . Urinary incontinence 10/29/2013  . Tinea pedis 07/08/2013  . Weight gain 09/28/2012  . Allergic rhinitis 09/28/2012  . Intertrigo 05/28/2012  . Microalbuminuria 11/29/2011  . Osteopenia 07/13/2011  . Internal and external hemorrhoids without complication 69/06/7492  . Benign neoplasm of colon 05/30/2011  . Preventative health care 05/09/2011  . Essential hypertension, benign 01/12/2009  . TOBACCO ABUSE 08/05/2008  . Diabetes mellitus type 2 in obese ([place]) 01/11/2007  . Hyperlipidemia 01/11/2007  . DEPRESSION 01/11/2007  . OBSTRUCTIVE SLEEP APNEA 01/11/2007  . Coronary atherosclerosis 01/11/2007  . Seasonal and perennial allergic rhinitis 01/11/2007  . GERD 01/11/2007   Past Medical History:  Diagnosis Date  . Acquired deformity of right foot   . CAD (coronary artery disease) primary cardioloigst-  dr Stanford Breed   hx NSTEMI 10-24-2001  s/p  cardiac cath w/ PCI and DES x1 to first diagonal  . Chronic constipation   . Depression   . Fatty liver 04/26/2015  . GERD (gastroesophageal reflux disease)   . History of abnormal cervical Pap smear    ACUS 2012  . History of exercise stress test 05-25-2014  dr Stanford Breed   borderline (indeterminate) with non-specific ST changes (mild ST depression in leads II,III, aVF, & V6, did not meet criteria for ischemia) ;  pt had low intolerence, no cp, normal heartrate and bp response, no arrhythmias  .  History of goiter    as child s/p  removal ,  per pt benign , neck area  (not thyroid)  . History of non-ST elevation myocardial infarction (NSTEMI) 10/24/2001   s/p  PCI and DES to first diagonal  . Hyperlipidemia   . Hypertension   . OSA on CPAP pulmoloigst-  dr  young   per study 11-07-2004  severe osa  . Right foot pain   . S/P drug eluting coronary stent placement 10/27/2001   x1 to first diagonal   . Seasonal and perennial allergic rhinitis   . Type 2 diabetes mellitus ([place])   . Vitamin D deficiency   . Wears dentures    upper and lower partial denture  . Wears glasses    Past Surgical History:  Procedure Laterality Date  . BUNIONECTOMY Left 2012  . BUNIONECTOMY Right 04/19/2017   Procedure: RIGHT BUNIONECTOMY;  Surgeon: Rosemary Holms, DPM;  Location: [facility];  Service: Podiatry;  Laterality: Right;  . CARDIAC CATHETERIZATION  09-11-2002   dr Lia Foyer   well-preserved LVF; continued patency previouly placed stent in diagonal branch;  mild luminal irregularities   . CARDIOVASCULAR STRESS TEST  05-16-2011   dr Stanford Breed   normal nuclear study w/ no evidence ischemia/  normal LV function and wall motion , ef 77%  . CORONARY ANGIOPLASTY WITH STENT PLACEMENT  10-27-2001   dr Lia Foyer   high-grade stenosis first diagonal post PCI and DES (TAXUS);  mild luminal irregularity throughout LAD, RCA, and very mild CFx;  preserved LVF  . goiter removal  1968   per pt benign tumor removed from neck beside throat  . HALLUX VALGUS LAPIDUS Right 04/19/2017   Procedure: HALLUX VALGUS LAPIDUS FUSION;  Surgeon: Rosemary Holms, DPM;  Location: [place];  Service: Podiatry;  Laterality: Right;  . HAMMER TOE SURGERY Right 04/19/2017   Procedure: HAMMER TOE CORRECTION RIGHT 2ND;  Surgeon: Rosemary Holms, DPM;  Location: [facility];  Service: Podiatry;  Laterality: Right;  . METATARSAL OSTEOTOMY Right 04/19/2017   Procedure: 2ND METATARSAL OSTEOTOMY;  Surgeon: Rosemary Holms, DPM;  Location: [place];  Service: Podiatry;  Laterality: Right;  . TONSILLECTOMY AND ADENOIDECTOMY  age 28  . TUBAL LIGATION Bilateral 1985   Social History   Tobacco Use  . Smoking status: Former Smoker    Years:  46.00    Types: Cigarettes    Last attempt to quit: 09/23/2012    Years since quitting: 6.1  . Smokeless tobacco: Never Used  Substance Use Topics  . Alcohol use: Yes    Alcohol/week: 0.0 standard drinks    Comment: occasional  . Drug use: No   Family History  Problem Relation Age of Onset  . Cancer Mother        throat, cervical  . Diabetes Mother   . Stroke Mother   . Heart attack Mother   . Heart disease Mother   . Hypertension Mother   . Thyroid disease Mother   . Alcoholism Mother   . Cancer Father        prostate  . Diabetes Sister   . Heart attack Sister   . Hypertension Sister   . Hyperlipidemia Sister   . Colon cancer Neg Hx   . Breast cancer Neg Hx    No Known Allergies Current Outpatient Medications on File Prior to Visit  Medication Sig Dispense Refill  . amLODipine (NORVASC) 10 MG tablet Take 1 tablet (10 mg total) by  mouth daily. 90 tablet 1  . aspirin 81 MG tablet Take 81 mg by mouth daily.      Marland Kitchen buPROPion (WELLBUTRIN XL) 150 MG 24 hr tablet Take 3 tablets (450 mg total) by mouth every morning. 90 tablet 2  . Calcium Carbonate-Vitamin D (CALTRATE 600+D) 600-400 MG-UNIT per tablet Take 2 tablets 2 (two) times daily by mouth.     . Cholecalciferol (VITAMIN D3) 3000 units TABS Take 1 tablet by mouth daily. 30 tablet   . glucose blood (ONE TOUCH ULTRA TEST) test strip 1 each by Other route 3 (three) times daily. for testing 100 each 12  . Insulin Pen Needle (BD PEN NEEDLE NANO U/F) 32G X 4 MM MISC USE ONCE DAILY WITH INSULIN INJECTIONS 100 each 1  . Lancets (ONETOUCH ULTRASOFT) lancets TEST BLOOD SUGAR ONCE DAILY 100 each 0  . liraglutide (VICTOZA) 18 MG/3ML SOPN ADMINISTER 1.8 MG UNDER THE SKIN DAILY 27 mL 1  . meloxicam (MOBIC) 7.5 MG tablet Take by mouth.    . metFORMIN (GLUCOPHAGE-XR) 750 MG 24 hr tablet Take 1 tablet (750 mg total) by mouth daily with breakfast. 180 tablet 1  . metoprolol succinate (TOPROL-XL) 100 MG 24 hr tablet Take 1 tablet (100 mg  total) by mouth daily. Take with or immediately following a meal. 90 tablet 1  . Multiple Vitamins-Minerals (MULTIVITAMIN ADULTS 50+ PO) Take 1 tablet every morning by mouth.     . olmesartan-hydrochlorothiazide (BENICAR HCT) 40-12.5 MG tablet TAKE 1 TABLET BY MOUTH DAILY 90 tablet 3  . pantoprazole (PROTONIX) 40 MG tablet Take 1 tablet (40 mg total) by mouth every morning. 30 tablet 5  . Probiotic Product (PROBIOTIC DAILY PO) Take 1 capsule every morning by mouth.     . rosuvastatin (CRESTOR) 20 MG tablet Take 1 tablet (20 mg total) by mouth daily. 90 tablet 3   No current facility-administered medications on file prior to visit.     Observations/Objective: Patient appears well, in no distress Weight is baseline (obese) No facial swelling or asymmetry Normal voice-not hoarse and no slurred speech No obvious tremor or mobility impairment Moving neck and UEs normally Able to hear the call well  No cough or shortness of breath during interview  Talkative and mentally sharp with no cognitive changes Rash noted- patches of drying vesicles with erythema - R posterior hand , less dried in L antecubital area  (seen on camera) No excoriations or pus, no obvious drainage  Some dried cream surrounding that appears white  Affect is normal     Assessment and Plan: Problem List Items Addressed This Visit      Musculoskeletal and Integument   Plant allergic contact dermatitis    From poison ivy/oak or sumac  Unsure if it was brought in by a pet  Want to avoid prednisone if possible due to diabetes Sent in triamcinolone cream to use bid  Also recommend zyrtec 10 mg daily for itch Keep cool  Clean with soap and water  Launder clothing or linens that may have contacted oil of the plant (as well as pets)  She will try to identify plant and have someone get rid of it as well  Update if not starting to improve in a week or if worsening  (would consider prednisone if worse or no improvement)             Follow Up Instructions:   Stay cool (hot conditions make itching worse) Try zyrtec 10 mg daily over the counter  for itch (watch for sedation also)  Keep rash clean and dry with soap and water  Try the triamcinolone cream twice daily as needed  If worse or no improvement please alert me and I will send in oral prednisone (only if necessary) avoiding it for now because it will raise your blood sugar significantly   Look out for the offending plant in yard/close to house and have someone get rid of it if able  Also try to keep pets out of it Launder clothing/shoes/linens (anything that could come in contact with the plant oil)   Update if not starting to improve in a week or if worsening   I discussed the assessment and treatment plan with the patient. The patient was provided an opportunity to ask questions and all were answered. The patient agreed with the plan and demonstrated an understanding of the instructions.   The patient was advised to call back or seek an in-person evaluation if the symptoms worsen or if the condition fails to improve as anticipated.     Loura Pardon, MD

## 2018-11-04 NOTE — Assessment & Plan Note (Signed)
From poison ivy/oak or sumac  Unsure if it was brought in by a pet  Want to avoid prednisone if possible due to diabetes Sent in triamcinolone cream to use bid  Also recommend zyrtec 10 mg daily for itch Keep cool  Clean with soap and water  Launder clothing or linens that may have contacted oil of the plant (as well as pets)  She will try to identify plant and have someone get rid of it as well  Update if not starting to improve in a week or if worsening  (would consider prednisone if worse or no improvement)

## 2018-11-06 ENCOUNTER — Telehealth: Payer: Self-pay

## 2018-11-06 MED ORDER — PREDNISONE 10 MG PO TABS: ORAL_TABLET | ORAL | 0 refills | Status: DC

## 2018-11-06 NOTE — Telephone Encounter (Signed)
Pt notified Rx sent and advised of Dr. Tower's comments  

## 2018-11-06 NOTE — Telephone Encounter (Signed)
Pt left v/m; pt seen for virtual visit on 11/04/18.med for poison ivy is not helping and pt request prednisone sent to [facility].

## 2018-11-06 NOTE — Telephone Encounter (Signed)
I sent it  Let her know to expect increase in blood sugar when she is on it  Update [place] if this doesn't help

## 2018-11-11 ENCOUNTER — Other Ambulatory Visit: Payer: PPO

## 2018-11-13 ENCOUNTER — Ambulatory Visit (INDEPENDENT_AMBULATORY_CARE_PROVIDER_SITE_OTHER): Payer: PPO

## 2018-11-13 DIAGNOSIS — Z0000 Encounter for general adult medical examination without abnormal findings: Secondary | ICD-10-CM | POA: Diagnosis not present

## 2018-11-13 NOTE — Progress Notes (Signed)
Subjective:   [name] [name] is a 68 y.o. female who presents for an Initial Medicare Annual Wellness Visit.  Review of Systems    N/A  Cardiac Risk Factors include: advanced age (>12men, >41 women);diabetes mellitus;dyslipidemia;hypertension;microalbuminuria;obesity (BMI >30kg/m2)     Objective:    Today's Vitals   11/13/18 0940 11/13/18 0941  PainSc: 0-No pain 0-No pain   There is no height or weight on file to calculate BMI.  Advanced Directives 11/13/2018 04/19/2017  Does Patient Have a Medical Advance Directive? No No  Would patient like information on creating a medical advance directive? No - Patient declined No - Patient declined    Current Medications (verified) Outpatient Encounter Medications as of 11/13/2018  Medication Sig  . amLODipine (NORVASC) 10 MG tablet Take 1 tablet (10 mg total) by mouth daily.  Marland Kitchen aspirin 81 MG tablet Take 81 mg by mouth daily.    Marland Kitchen buPROPion (WELLBUTRIN XL) 150 MG 24 hr tablet Take 3 tablets (450 mg total) by mouth every morning.  . Calcium Carbonate-Vitamin D (CALTRATE 600+D) 600-400 MG-UNIT per tablet Take 2 tablets 2 (two) times daily by mouth.   . Cholecalciferol (VITAMIN D3) 3000 units TABS Take 1 tablet by mouth daily.  Marland Kitchen glucose blood (ONE TOUCH ULTRA TEST) test strip 1 each by Other route 3 (three) times daily. for testing  . Insulin Pen Needle (BD PEN NEEDLE NANO U/F) 32G X 4 MM MISC USE ONCE DAILY WITH INSULIN INJECTIONS  . Lancets (ONETOUCH ULTRASOFT) lancets TEST BLOOD SUGAR ONCE DAILY  . liraglutide (VICTOZA) 18 MG/3ML SOPN ADMINISTER 1.8 MG UNDER THE SKIN DAILY  . meloxicam (MOBIC) 7.5 MG tablet Take by mouth.  . metFORMIN (GLUCOPHAGE-XR) 750 MG 24 hr tablet Take 1 tablet (750 mg total) by mouth daily with breakfast.  . metoprolol succinate (TOPROL-XL) 100 MG 24 hr tablet Take 1 tablet (100 mg total) by mouth daily. Take with or immediately following a meal.  . Multiple Vitamins-Minerals (MULTIVITAMIN ADULTS 50+ PO) Take 1  tablet every morning by mouth.   . olmesartan-hydrochlorothiazide (BENICAR HCT) 40-12.5 MG tablet TAKE 1 TABLET BY MOUTH DAILY  . pantoprazole (PROTONIX) 40 MG tablet Take 1 tablet (40 mg total) by mouth every morning.  . predniSONE (DELTASONE) 10 MG tablet Take 3 pills once daily by mouth for 3 days, then 2 pills once daily for 3 days, then 1 pill once daily for 3 days and then stop  . Probiotic Product (PROBIOTIC DAILY PO) Take 1 capsule every morning by mouth.   . rosuvastatin (CRESTOR) 20 MG tablet Take 1 tablet (20 mg total) by mouth daily.  Marland Kitchen triamcinolone cream (KENALOG) 0.1 % Apply 1 application topically 2 (two) times daily. To affected area/rash   No facility-administered encounter medications on file as of 11/13/2018.     Allergies (verified) Patient has no known allergies.   History: Past Medical History:  Diagnosis Date  . Acquired deformity of right foot   . CAD (coronary artery disease) primary cardioloigst-  dr Stanford Breed   hx NSTEMI 10-24-2001  s/p  cardiac cath w/ PCI and DES x1 to first diagonal  . Chronic constipation   . Depression   . Fatty liver 04/26/2015  . GERD (gastroesophageal reflux disease)   . History of abnormal cervical Pap smear    ACUS 2012  . History of exercise stress test 05-25-2014  dr Stanford Breed   borderline (indeterminate) with non-specific ST changes (mild ST depression in leads II,III, aVF, & V6, did not meet  criteria for ischemia) ;  pt had low intolerence, no cp, normal heartrate and bp response, no arrhythmias  . History of goiter    as child s/p  removal ,  per pt benign , neck area  (not thyroid)  . History of non-ST elevation myocardial infarction (NSTEMI) 10/24/2001   s/p  PCI and DES to first diagonal  . Hyperlipidemia   . Hypertension   . OSA on CPAP pulmoloigst-  dr young   per study 11-07-2004  severe osa  . Right foot pain   . S/P drug eluting coronary stent placement 10/27/2001   x1 to first diagonal   . Seasonal and perennial  allergic rhinitis   . Type 2 diabetes mellitus ([place])   . Vitamin D deficiency   . Wears dentures    upper and lower partial denture  . Wears glasses    Past Surgical History:  Procedure Laterality Date  . BUNIONECTOMY Left 2012  . BUNIONECTOMY Right 04/19/2017   Procedure: RIGHT BUNIONECTOMY;  Surgeon: Rosemary Holms, DPM;  Location: [facility];  Service: Podiatry;  Laterality: Right;  . CARDIAC CATHETERIZATION  09-11-2002   dr Lia Foyer   well-preserved LVF; continued patency previouly placed stent in diagonal branch;  mild luminal irregularities   . CARDIOVASCULAR STRESS TEST  05-16-2011   dr Stanford Breed   normal nuclear study w/ no evidence ischemia/  normal LV function and wall motion , ef 77%  . CORONARY ANGIOPLASTY WITH STENT PLACEMENT  10-27-2001   dr Lia Foyer   high-grade stenosis first diagonal post PCI and DES (TAXUS);  mild luminal irregularity throughout LAD, RCA, and very mild CFx;  preserved LVF  . goiter removal  1968   per pt benign tumor removed from neck beside throat  . HALLUX VALGUS LAPIDUS Right 04/19/2017   Procedure: HALLUX VALGUS LAPIDUS FUSION;  Surgeon: Rosemary Holms, DPM;  Location: [facility];  Service: Podiatry;  Laterality: Right;  . HAMMER TOE SURGERY Right 04/19/2017   Procedure: HAMMER TOE CORRECTION RIGHT 2ND;  Surgeon: Rosemary Holms, DPM;  Location: [facility];  Service: Podiatry;  Laterality: Right;  . METATARSAL OSTEOTOMY Right 04/19/2017   Procedure: 2ND METATARSAL OSTEOTOMY;  Surgeon: Rosemary Holms, DPM;  Location: [facility];  Service: Podiatry;  Laterality: Right;  . TONSILLECTOMY AND ADENOIDECTOMY  age 51  . TUBAL LIGATION Bilateral 1985   Family History  Problem Relation Age of Onset  . Cancer Mother        throat, cervical  . Diabetes Mother   . Stroke Mother   . Heart attack Mother   . Heart disease Mother   . Hypertension Mother   . Thyroid disease Mother   . Alcoholism  Mother   . Cancer Father        prostate  . Diabetes Sister   . Heart attack Sister   . Hypertension Sister   . Hyperlipidemia Sister   . Colon cancer Neg Hx   . Breast cancer Neg Hx    Social History   Socioeconomic History  . Marital status: Married    Spouse name: Not on file  . Number of children: 3  . Years of education: Not on file  . Highest education level: Not on file  Occupational History  . Occupation: Air cabin crew    Comment: credit Engineer, drilling: [employer]  Social Needs  . Financial resource strain: Not on file  . Food insecurity:  Worry: Not on file    Inability: Not on file  . Transportation needs:    Medical: Not on file    Non-medical: Not on file  Tobacco Use  . Smoking status: Former Smoker    Years: 46.00    Types: Cigarettes    Last attempt to quit: 09/23/2012    Years since quitting: 6.1  . Smokeless tobacco: Never Used  Substance and Sexual Activity  . Alcohol use: Yes    Alcohol/week: 0.0 standard drinks    Comment: occasional wine  . Drug use: No  . Sexual activity: Not Currently  Lifestyle  . Physical activity:    Days per week: Not on file    Minutes per session: Not on file  . Stress: Not on file  Relationships  . Social connections:    Talks on phone: Not on file    Gets together: Not on file    Attends religious service: Not on file    Active member of club or organization: Not on file    Attends meetings of clubs or organizations: Not on file    Relationship status: Not on file  Other Topics Concern  . Not on file  Social History Narrative   Caffeine Use:  2 cups coffee daily   Regular exercise:  No       Tobacco Counseling Counseling given: No   Clinical Intake:  Pre-visit preparation completed: Yes  Pain : No/denies pain Pain Score: 0-No pain     Nutritional Status: BMI 25 -29 Overweight Nutritional Risks: None Diabetes: Yes CBG done?: No Did pt. bring in CBG monitor  from home?: No  How often do you need to have someone help you when you read instructions, pamphlets, or other written materials from your doctor or pharmacy?: 1 - Never What is the last grade level you completed in school?: 12th grade  Interpreter Needed?: No  Comments: pt lives with spouse Information entered by :: LPinson, LPN   Activities of Daily Living In your present state of health, do you have any difficulty performing the following activities: 11/13/2018  Hearing? Y  Vision? N  Difficulty concentrating or making decisions? N  Walking or climbing stairs? N  Dressing or bathing? N  Doing errands, shopping? N  Preparing Food and eating ? N  Using the Toilet? N  In the past six months, have you accidently leaked urine? Y  Do you have problems with loss of bowel control? N  Managing your Medications? N  Managing your Finances? N  Housekeeping or managing your Housekeeping? N  Some recent data might be hidden     Immunizations and Health Maintenance Immunization History  Administered Date(s) Administered  . Influenza Split 03/05/2012  . Influenza, High Dose Seasonal PF 04/25/2015, 02/15/2016, 03/27/2017, 04/09/2018  . Influenza,inj,Quad PF,6+ Mos 03/31/2013, 02/03/2014  . Pneumococcal Conjugate-13 04/25/2015  . Pneumococcal Polysaccharide-23 05/09/2011, 08/22/2016  . Tdap 05/09/2011   There are no preventive care reminders to display for this patient.  Patient Care Team: Elby Beck, FNP as PCP - General (Nurse Practitioner) Lelon Perla, MD as PCP - Cardiology (Cardiology) Stanford Breed Denice Bors, MD as Consulting Physician (Cardiology) Sable Feil, MD as Consulting Physician (Gastroenterology) Azucena Fallen, MD as Consulting Physician (Obstetrics and Gynecology)  Indicate any recent Medical Services you may have received from other than [facility] providers in the past year (date may be approximate).     Assessment:   This is a routine wellness  examination for Patte.  Hearing/Vision screen Vision Screening Comments: Vision exam in 2019 @ [facility]  Dietary issues and exercise activities discussed: Current Exercise Habits: The patient does not participate in regular exercise at present, Exercise limited by: None identified  Goals    . Patient Stated     Starting 11/13/18, I will continue to take medications as prescribed.       Depression Screen PHQ 2/9 Scores 11/13/2018 07/14/2018 09/30/2017 09/05/2016 08/22/2016 04/25/2015 12/15/2014  PHQ - 2 Score 0 0 0 2 0 0 0  PHQ- 9 Score 0 - 2 15 2  - -    Fall Risk Fall Risk  11/13/2018 07/14/2018 09/30/2017 03/27/2017 04/25/2015  Falls in the past year? 0 1 No Yes No  Number falls in past yr: - 1 - 1 -  Injury with Fall? - 0 - Yes -  Risk for fall due to : - History of fall(s) - - -   Cognitive Function: MMSE - Mini Mental State Exam 11/13/2018  Orientation to time 5  Orientation to Place 5  Registration 3  Attention/ Calculation 0  Recall 3  Language- name 2 objects 0  Language- repeat 1  Language- follow 3 step command 0  Language- read & follow direction 0  Write a sentence 0  Copy design 0  Total score 17     PLEASE NOTE: A Mini-Cog screen was completed. Maximum score is 17. A value of 0 denotes this part of Folstein MMSE was not completed or the patient failed this part of the Mini-Cog screening.   Mini-Cog Screening Orientation to Time - Max 5 pts Orientation to Place - Max 5 pts Registration - Max 3 pts Recall - Max 3 pts Language Repeat - Max 1 pts      Screening Tests Health Maintenance  Topic Date Due  . FOOT EXAM  11/19/2018 (Originally 03/27/2018)  . INFLUENZA VACCINE  01/10/2019  . HEMOGLOBIN A1C  04/07/2019  . OPHTHALMOLOGY EXAM  08/01/2019  . MAMMOGRAM  08/11/2020  . TETANUS/TDAP  05/08/2021  . COLONOSCOPY  05/29/2021  . DEXA SCAN  Completed  . Hepatitis C Screening  Completed  . PNA vac Low Risk Adult  Completed     Plan:     I have  personally reviewed, addressed, and noted the following in the patient's chart:  A. Medical and social history B. Use of alcohol, tobacco or illicit drugs  C. Current medications and supplements D. Functional ability and status E.  Nutritional status F.  Physical activity G. Advance directives H. List of other physicians I.  Hospitalizations, surgeries, and ER visits in previous 12 months J.  Vitals (unless it is a telemedicine encounter) K. Screenings to include cognitive, depression, hearing, vision (NOTE: hearing and vision screenings not completed in telemedicine encounter) L. Referrals and appointments   In addition, I have reviewed and discussed with patient certain preventive protocols, quality metrics, and best practice recommendations. A written personalized care plan for preventive services and recommendations were provided to patient.  With patient's permission, we connected on 11/13/18 at  9:30 AM EDT. Interactive audio and video telecommunications were attempted with patient. This attempt was unsuccessful due to patient having technical difficulties OR patient did not have access to video capability.  Encounter was completed with audio only.  Two patient identifiers were used to ensure the encounter occurred with the correct person. Patient was in home and writer was in office.   Signed,   Lindell Noe, MHA, BS, LPN Health Coach

## 2018-11-13 NOTE — Progress Notes (Signed)
PCP notes:   Health maintenance:  Foot exam - PCP follow-up requested  Abnormal screenings:   None  Patient concerns:   None  Nurse concerns:  None  Next PCP appt:   11/19/18 @ 0830  I reviewed health advisor's note, was available for consultation on the day of service listed in this note, and agree with documentation and plan. Elsie Stain, MD.

## 2018-11-13 NOTE — Patient Instructions (Signed)
[name] [name] , Thank you for taking time to come for your Medicare Wellness Visit. I appreciate your ongoing commitment to your health goals. Please review the following plan we discussed and let me know if I can assist you in the future.   These are the goals we discussed: Goals    . Patient Stated     Starting 11/13/18, I will continue to take medications as prescribed.        This is a list of the screening recommended for you and due dates:  Health Maintenance  Topic Date Due  . Complete foot exam   11/19/2018*  . Flu Shot  01/10/2019  . Hemoglobin A1C  04/07/2019  . Eye exam for diabetics  08/01/2019  . Mammogram  08/11/2020  . Tetanus Vaccine  05/08/2021  . Colon Cancer Screening  05/29/2021  . DEXA scan (bone density measurement)  Completed  .  Hepatitis C: One time screening is recommended by Center for Disease Control  (CDC) for  adults born from [birth year] through 1966.   Completed  . Pneumonia vaccines  Completed  *Topic was postponed. The date shown is not the original due date.   Preventive Care for Adults  A healthy lifestyle and preventive care can promote health and wellness. Preventive health guidelines for adults include the following key practices.  . A routine yearly physical is a good way to check with your health care provider about your health and preventive screening. It is a chance to share any concerns and updates on your health and to receive a thorough exam.  . Visit your dentist for a routine exam and preventive care every 6 months. Brush your teeth twice a day and floss once a day. Good oral hygiene prevents tooth decay and gum disease.  . The frequency of eye exams is based on your age, health, family medical history, use  of contact lenses, and other factors. Follow your health care provider's recommendations for frequency of eye exams.  . Eat a healthy diet. Foods like vegetables, fruits, whole grains, low-fat dairy products, and lean protein foods contain  the nutrients you need without too many calories. Decrease your intake of foods high in solid fats, added sugars, and salt. Eat the right amount of calories for you. Get information about a proper diet from your health care provider, if necessary.  . Regular physical exercise is one of the most important things you can do for your health. Most adults should get at least 150 minutes of moderate-intensity exercise (any activity that increases your heart rate and causes you to sweat) each week. In addition, most adults need muscle-strengthening exercises on 2 or more days a week.  Silver Sneakers may be a benefit available to you. To determine eligibility, you may visit the website: www.silversneakers.com or contact program at (671)772-4172 Mon-Fri between 8AM-8PM.   . Maintain a healthy weight. The body mass index (BMI) is a screening tool to identify possible weight problems. It provides an estimate of body fat based on height and weight. Your health care provider can find your BMI and can help you achieve or maintain a healthy weight.   For adults 20 years and older: ? A BMI below 18.5 is considered underweight. ? A BMI of 18.5 to 24.9 is normal. ? A BMI of 25 to 29.9 is considered overweight. ? A BMI of 30 and above is considered obese.   . Maintain normal blood lipids and cholesterol levels by exercising and minimizing your intake  of saturated fat. Eat a balanced diet with plenty of fruit and vegetables. Blood tests for lipids and cholesterol should begin at age 91 and be repeated every 5 years. If your lipid or cholesterol levels are high, you are over 50, or you are at high risk for heart disease, you may need your cholesterol levels checked more frequently. Ongoing high lipid and cholesterol levels should be treated with medicines if diet and exercise are not working.  . If you smoke, find out from your health care provider how to quit. If you do not use tobacco, please do not start.  . If  you choose to drink alcohol, please do not consume more than 2 drinks per day. One drink is considered to be 12 ounces (355 mL) of beer, 5 ounces (148 mL) of wine, or 1.5 ounces (44 mL) of liquor.  . If you are 33-27 years old, ask your health care provider if you should take aspirin to prevent strokes.  . Use sunscreen. Apply sunscreen liberally and repeatedly throughout the day. You should seek shade when your shadow is shorter than you. Protect yourself by wearing long sleeves, pants, a wide-brimmed hat, and sunglasses year round, whenever you are outdoors.  . Once a month, do a whole body skin exam, using a mirror to look at the skin on your back. Tell your health care provider of new moles, moles that have irregular borders, moles that are larger than a pencil eraser, or moles that have changed in shape or color.

## 2018-11-14 ENCOUNTER — Encounter: Payer: Self-pay | Admitting: Psychiatry

## 2018-11-14 ENCOUNTER — Ambulatory Visit (INDEPENDENT_AMBULATORY_CARE_PROVIDER_SITE_OTHER): Payer: PPO | Admitting: Psychiatry

## 2018-11-14 ENCOUNTER — Other Ambulatory Visit: Payer: Self-pay

## 2018-11-14 DIAGNOSIS — F331 Major depressive disorder, recurrent, moderate: Secondary | ICD-10-CM

## 2018-11-14 DIAGNOSIS — F423 Hoarding disorder: Secondary | ICD-10-CM

## 2018-11-14 NOTE — Progress Notes (Signed)
Gift Rueckert [id number] 06-Mar-1951 68 y.o.  Subjective:   Patient ID:  [name] [name] is a 68 y.o. (DOB 22-Feb-1951) female.  Chief Complaint:  Chief Complaint  Patient presents with  . Follow-up    Medication Management  . Depression    Medication Management    Depression         Associated symptoms include no decreased concentration and no suicidal ideas.  [name] [name] presents to the office today for follow-up of depression and med change.   Last seen December. Recommended NAC  And stop naltrexone to see if it was causing GI problems.  Rec counseling for hoarding but she didn't go.  Can't remember the effect of stopping naltrexone.  NAC caused GI distress.   Still not a lot of progress with hoarding and adjusting to not working.  Overall ok mood with good and bad days.  Harder to adjust to age.  She manages to go out almost daily.  She needs to get out of the house.  [place]  H works all day and then goes to his shop.  Had started gym then Covid.  Patient reports stable mood and denies depressed or irritable moods.  Patient denies any recent difficulty with anxiety.  Patient denies difficulty with sleep initiation or maintenance. Denies appetite disturbance.  Patient reports that energy has been good.  Motivation to clean is not good. Patient denies any difficulty with concentration.  Patient denies any suicidal ideation.  Retirement date Oct 5 and has been a bit depressed.  All these years she's neglected her house but doesn't do anything about it.  Some hoarding.  Doesn't like to stay at home.  No where to put anything in the house bc the closets are full.  Can't get motivated to do anything about it.  Thinks it's motivation and uncertainty over what to do with things.  Won't let people visit bc she's embarrassed.  Doesn't like the house.  H won't move and won't help get rid of things.  Past Psychiatric Medication Trials:  Wellbutrin XL 4 5050+ Vraylar 1.5, Abilify 10, Xanax She has been on Wellbutrin from this practice since 2004  Review of Systems:  Review of Systems  Musculoskeletal: Positive for arthralgias and back pain.  Neurological: Negative for tremors and weakness.  Psychiatric/Behavioral: Positive for depression. Negative for agitation, behavioral problems, confusion, decreased concentration, dysphoric mood, hallucinations, self-injury, sleep disturbance and suicidal ideas. The patient is not nervous/anxious and is not hyperactive.     Medications: I have reviewed the patient's current medications.  Current Outpatient Medications  Medication Sig Dispense Refill  . amLODipine (NORVASC) 10 MG tablet Take 1 tablet (10 mg total) by mouth daily. 90 tablet 1  . aspirin 81 MG tablet Take 81 mg by mouth daily.      Marland Kitchen buPROPion (WELLBUTRIN XL) 150 MG 24 hr tablet Take 3 tablets (450 mg total) by mouth every morning. 90 tablet 2  . Calcium Carbonate-Vitamin D (CALTRATE 600+D) 600-400 MG-UNIT per tablet Take 2 tablets 2 (two) times daily by mouth.     . Cholecalciferol (VITAMIN D3) 3000 units TABS Take 1 tablet by mouth daily. 30 tablet   . glucose blood (ONE TOUCH ULTRA TEST) test strip 1 each by Other route 3 (three) times daily. for testing 100 each 12  . Insulin Pen Needle (BD PEN NEEDLE NANO U/F) 32G X 4 MM MISC USE ONCE DAILY WITH INSULIN INJECTIONS  100 each 1  . Lancets (ONETOUCH ULTRASOFT) lancets TEST BLOOD SUGAR ONCE DAILY 100 each 0  . liraglutide (VICTOZA) 18 MG/3ML SOPN ADMINISTER 1.8 MG UNDER THE SKIN DAILY 27 mL 1  . meloxicam (MOBIC) 7.5 MG tablet Take by mouth.    . metFORMIN (GLUCOPHAGE-XR) 750 MG 24 hr tablet Take 1 tablet (750 mg total) by mouth daily with breakfast. 180 tablet 1  . metoprolol succinate (TOPROL-XL) 100 MG 24 hr tablet Take 1 tablet (100 mg total) by mouth daily. Take with or immediately following a meal. 90 tablet 1  . Multiple Vitamins-Minerals (MULTIVITAMIN ADULTS 50+  PO) Take 1 tablet every morning by mouth.     . olmesartan-hydrochlorothiazide (BENICAR HCT) 40-12.5 MG tablet TAKE 1 TABLET BY MOUTH DAILY 90 tablet 3  . pantoprazole (PROTONIX) 40 MG tablet Take 1 tablet (40 mg total) by mouth every morning. 30 tablet 5  . predniSONE (DELTASONE) 10 MG tablet Take 3 pills once daily by mouth for 3 days, then 2 pills once daily for 3 days, then 1 pill once daily for 3 days and then stop (Patient taking differently: No sig reported) 18 tablet 0  . Probiotic Product (PROBIOTIC DAILY PO) Take 1 capsule every morning by mouth.     . rosuvastatin (CRESTOR) 20 MG tablet Take 1 tablet (20 mg total) by mouth daily. 90 tablet 3  . triamcinolone cream (KENALOG) 0.1 % Apply 1 application topically 2 (two) times daily. To affected area/rash 45 g 1   No current facility-administered medications for this visit.     Medication Side Effects: None  Allergies: No Known Allergies  Past Medical History:  Diagnosis Date  . Acquired deformity of right foot   . CAD (coronary artery disease) primary cardioloigst-  dr Stanford Breed   hx NSTEMI 10-24-2001  s/p  cardiac cath w/ PCI and DES x1 to first diagonal  . Chronic constipation   . Depression   . Fatty liver 04/26/2015  . GERD (gastroesophageal reflux disease)   . History of abnormal cervical Pap smear    ACUS 2012  . History of exercise stress test 05-25-2014  dr Stanford Breed   borderline (indeterminate) with non-specific ST changes (mild ST depression in leads II,III, aVF, & V6, did not meet criteria for ischemia) ;  pt had low intolerence, no cp, normal heartrate and bp response, no arrhythmias  . History of goiter    as child s/p  removal ,  per pt benign , neck area  (not thyroid)  . History of non-ST elevation myocardial infarction (NSTEMI) 10/24/2001   s/p  PCI and DES to first diagonal  . Hyperlipidemia   . Hypertension   . OSA on CPAP pulmoloigst-  dr young   per study 11-07-2004  severe osa  . Right foot pain   .  S/P drug eluting coronary stent placement 10/27/2001   x1 to first diagonal   . Seasonal and perennial allergic rhinitis   . Type 2 diabetes mellitus ([place])   . Vitamin D deficiency   . Wears dentures    upper and lower partial denture  . Wears glasses     Family History  Problem Relation Age of Onset  . Cancer Mother        throat, cervical  . Diabetes Mother   . Stroke Mother   . Heart attack Mother   . Heart disease Mother   . Hypertension Mother   . Thyroid disease Mother   . Alcoholism Mother   .  Cancer Father        prostate  . Diabetes Sister   . Heart attack Sister   . Hypertension Sister   . Hyperlipidemia Sister   . Colon cancer Neg Hx   . Breast cancer Neg Hx     Social History   Socioeconomic History  . Marital status: Married    Spouse name: Not on file  . Number of children: 3  . Years of education: Not on file  . Highest education level: Not on file  Occupational History  . Occupation: Air cabin crew    Comment: credit Engineer, drilling: [employer]  Social Needs  . Financial resource strain: Not on file  . Food insecurity:    Worry: Not on file    Inability: Not on file  . Transportation needs:    Medical: Not on file    Non-medical: Not on file  Tobacco Use  . Smoking status: Former Smoker    Years: 46.00    Types: Cigarettes    Last attempt to quit: 09/23/2012    Years since quitting: 6.1  . Smokeless tobacco: Never Used  Substance and Sexual Activity  . Alcohol use: Yes    Alcohol/week: 0.0 standard drinks    Comment: occasional wine  . Drug use: No  . Sexual activity: Not Currently  Lifestyle  . Physical activity:    Days per week: Not on file    Minutes per session: Not on file  . Stress: Not on file  Relationships  . Social connections:    Talks on phone: Not on file    Gets together: Not on file    Attends religious service: Not on file    Active member of club or organization: Not on file     Attends meetings of clubs or organizations: Not on file    Relationship status: Not on file  . Intimate partner violence:    Fear of current or ex partner: Not on file    Emotionally abused: Not on file    Physically abused: Not on file    Forced sexual activity: Not on file  Other Topics Concern  . Not on file  Social History Narrative   Caffeine Use:  2 cups coffee daily   Regular exercise:  No       Past Medical History, Surgical history, Social history, and Family history were reviewed and updated as appropriate.   Please see review of systems for further details on the patient's review from today.   Objective:   Physical Exam:  There were no vitals taken for this visit.  Physical Exam Constitutional:      General: She is not in acute distress.    Appearance: She is well-developed.  Musculoskeletal:        General: No deformity.  Neurological:     Mental Status: She is alert and oriented to person, place, and time.     Motor: No tremor.     Coordination: Coordination normal.     Gait: Gait normal.  Psychiatric:        Attention and Perception: She is attentive.        Mood and Affect: Mood is depressed. Mood is not anxious. Affect is not labile, blunt, angry or inappropriate.        Speech: Speech normal.        Behavior: Behavior normal.        Thought Content: Thought content normal. Thought content  does not include homicidal or suicidal ideation. Thought content does not include homicidal or suicidal plan.        Judgment: Judgment normal.     Comments: Insight intact. No auditory or visual hallucinations. No delusions.  Depression is very mild generally. Some hoarding causes distress.     Lab Review:     Component Value Date/Time   NA 140 10/06/2018 1058   NA 143 08/29/2017 0850   K 3.8 10/06/2018 1058   CL 103 10/06/2018 1058   CO2 30 10/06/2018 1058   GLUCOSE 105 (H) 10/06/2018 1058   BUN 17 10/06/2018 1058   BUN 13 08/29/2017 0850   CREATININE  0.79 10/06/2018 1058   CREATININE 0.70 12/15/2014 1949   CALCIUM 9.3 10/06/2018 1058   PROT 6.5 10/06/2018 1058   PROT 6.5 08/29/2017 0850   ALBUMIN 4.1 10/06/2018 1058   ALBUMIN 4.3 08/29/2017 0850   AST 25 10/06/2018 1058   ALT 41 (H) 10/06/2018 1058   ALKPHOS 53 10/06/2018 1058   BILITOT 0.6 10/06/2018 1058   BILITOT 0.5 08/29/2017 0850   GFRNONAA 87 08/29/2017 0850   GFRNONAA 80 10/27/2013 0910   GFRAA 100 08/29/2017 0850   GFRAA >89 10/27/2013 0910       Component Value Date/Time   WBC 6.3 06/09/2018 1545   RBC 4.60 06/09/2018 1545   HGB 13.1 06/09/2018 1545   HGB 13.3 09/05/2016 1202   HCT 38.9 06/09/2018 1545   HCT 42.1 09/05/2016 1202   PLT 192.0 06/09/2018 1545   MCV 84.6 06/09/2018 1545   MCV 89 09/05/2016 1202   MCH 28.0 09/05/2016 1202   MCH 29.2 05/09/2011 0931   MCHC 33.7 06/09/2018 1545   RDW 16.1 (H) 06/09/2018 1545   RDW 15.7 (H) 09/05/2016 1202   LYMPHSABS 2.3 06/09/2018 1545   LYMPHSABS 1.7 09/05/2016 1202   MONOABS 0.4 06/09/2018 1545   EOSABS 0.1 06/09/2018 1545   EOSABS 0.1 09/05/2016 1202   BASOSABS 0.1 06/09/2018 1545   BASOSABS 0.0 09/05/2016 1202    No results found for: POCLITH, LITHIUM   No results found for: PHENYTOIN, PHENOBARB, VALPROATE, CBMZ   .res Assessment: Plan:    Major depressive disorder, recurrent episode, moderate (HCC)  Hoarding disorder with excessive acquisition   [name]'s depression is very mild and she is satisfied with the medications.  Problems solving around hoarding.  Needs activity to help structure her time.  Then needs strategies to deal with the hoarding, problem solved around that issue.    She thinks she tried the N-acetylcysteine but could not tolerate it for reasons that she cannot remember.  Option counseling for hoarding and depresssion with retirement. [facility].  15-minute appointment  FU 6 mos.  Lynder Parents, MD, DFAPA  Please see After Visit Summary for patient specific  instructions.  Future Appointments  Date Time Provider [place]  11/19/2018  8:30 AM Elby Beck, FNP [facility]  01/05/2019 10:00 AM Deneise Lever, MD [facility] None  05/19/2019 11:30 AM Cottle, Billey Co., MD [facility] None    No orders of the defined types were placed in this encounter.     -------------------------------

## 2018-11-18 MED ORDER — PREDNISONE 10 MG PO TABS: ORAL_TABLET | ORAL | 0 refills | Status: DC

## 2018-11-18 NOTE — Telephone Encounter (Signed)
I sent it -thanks for the update Do follow up with NP Carlean Purl as planned

## 2018-11-18 NOTE — Telephone Encounter (Signed)
Called and informed patient that her refill was sent to pharmacy and she needed to keep her appt with  Carlean Purl for tomorrow. Pt understood.   Shloime Keilman,cma

## 2018-11-18 NOTE — Addendum Note (Signed)
Addended by: Loura Pardon A on: 11/18/2018 11:06 AM   Modules accepted: Orders

## 2018-11-18 NOTE — Telephone Encounter (Signed)
Pt called b/c she finished the prednisone on 6/5, rash was getting better but now it is flaring up. The rash is all over her legs, chest, back and sides and it's very itchy. She is requesting a refill sent to [facility] in [place]. Pt has cpe in office tomorrow 6/10 with Tor Netters.

## 2018-11-19 ENCOUNTER — Other Ambulatory Visit: Payer: Self-pay

## 2018-11-19 ENCOUNTER — Ambulatory Visit (INDEPENDENT_AMBULATORY_CARE_PROVIDER_SITE_OTHER): Payer: PPO | Admitting: Family Medicine

## 2018-11-19 ENCOUNTER — Encounter: Payer: Self-pay | Admitting: Family Medicine

## 2018-11-19 VITALS — BP 136/72 | HR 78 | Temp 98.3°F | Resp 22 | Ht 62.75 in | Wt 192.5 lb

## 2018-11-19 DIAGNOSIS — K5909 Other constipation: Secondary | ICD-10-CM | POA: Diagnosis not present

## 2018-11-19 DIAGNOSIS — L237 Allergic contact dermatitis due to plants, except food: Secondary | ICD-10-CM

## 2018-11-19 DIAGNOSIS — E1169 Type 2 diabetes mellitus with other specified complication: Secondary | ICD-10-CM | POA: Diagnosis not present

## 2018-11-19 DIAGNOSIS — E119 Type 2 diabetes mellitus without complications: Secondary | ICD-10-CM

## 2018-11-19 DIAGNOSIS — Z0000 Encounter for general adult medical examination without abnormal findings: Secondary | ICD-10-CM | POA: Diagnosis not present

## 2018-11-19 DIAGNOSIS — E669 Obesity, unspecified: Secondary | ICD-10-CM | POA: Diagnosis not present

## 2018-11-19 LAB — MICROALBUMIN / CREATININE URINE RATIO
Creatinine,U: 101.9 mg/dL
Microalb Creat Ratio: 3.5 mg/g (ref 0.0–30.0)
Microalb, Ur: 3.6 mg/dL — ABNORMAL HIGH (ref 0.0–1.9)

## 2018-11-19 MED ORDER — METFORMIN HCL ER 750 MG PO TB24: 750.0000 mg | ORAL_TABLET | Freq: Two times a day (BID) | ORAL | 1 refills | Status: DC

## 2018-11-19 NOTE — Patient Instructions (Addendum)
Decrease your Victoza dose to 1.2 mg for 1 week then decrease to 0.6 mg. Until you are out. When you go to 0.6 mg, increase your metformin to twice a day.   Increase your water to 80 ounces a day. Take Benefiber 1/2 dose for 1 week then increase to 1 dose daily.   Send me your readings in about 4-6 weeks- check at different times a day.   Follow up in 3 months   Health Maintenance After Age 69 After age 11, you are at a higher risk for certain long-term diseases and infections as well as injuries from falls. Falls are a major cause of broken bones and head injuries in people who are older than age 64. Getting regular preventive care can help to keep you healthy and well. Preventive care includes getting regular testing and making lifestyle changes as recommended by your health care provider. Talk with your health care provider about:  Which screenings and tests you should have. A screening is a test that checks for a disease when you have no symptoms.  A diet and exercise plan that is right for you. What should I know about screenings and tests to prevent falls? Screening and testing are the best ways to find a health problem early. Early diagnosis and treatment give you the best chance of managing medical conditions that are common after age 90. Certain conditions and lifestyle choices may make you more likely to have a fall. Your health care provider may recommend:  Regular vision checks. Poor vision and conditions such as cataracts can make you more likely to have a fall. If you wear glasses, make sure to get your prescription updated if your vision changes.  Medicine review. Work with your health care provider to regularly review all of the medicines you are taking, including over-the-counter medicines. Ask your health care provider about any side effects that may make you more likely to have a fall. Tell your health care provider if any medicines that you take make you feel dizzy or sleepy.   Osteoporosis screening. Osteoporosis is a condition that causes the bones to get weaker. This can make the bones weak and cause them to break more easily.  Blood pressure screening. Blood pressure changes and medicines to control blood pressure can make you feel dizzy.  Strength and balance checks. Your health care provider may recommend certain tests to check your strength and balance while standing, walking, or changing positions.  Foot health exam. Foot pain and numbness, as well as not wearing proper footwear, can make you more likely to have a fall.  Depression screening. You may be more likely to have a fall if you have a fear of falling, feel emotionally low, or feel unable to do activities that you used to do.  Alcohol use screening. Using too much alcohol can affect your balance and may make you more likely to have a fall. What actions can I take to lower my risk of falls? General instructions  Talk with your health care provider about your risks for falling. Tell your health care provider if: ? You fall. Be sure to tell your health care provider about all falls, even ones that seem minor. ? You feel dizzy, sleepy, or off-balance.  Take over-the-counter and prescription medicines only as told by your health care provider. These include any supplements.  Eat a healthy diet and maintain a healthy weight. A healthy diet includes low-fat dairy products, low-fat (lean) meats, and fiber from whole  grains, beans, and lots of fruits and vegetables. Home safety  Remove any tripping hazards, such as rugs, cords, and clutter.  Install safety equipment such as grab bars in bathrooms and safety rails on stairs.  Keep rooms and walkways well-lit. Activity   Follow a regular exercise program to stay fit. This will help you maintain your balance. Ask your health care provider what types of exercise are appropriate for you.  If you need a cane or walker, use it as recommended by your  health care provider.  Wear supportive shoes that have nonskid soles. Lifestyle  Do not drink alcohol if your health care provider tells you not to drink.  If you drink alcohol, limit how much you have: ? 0-1 drink a day for women. ? 0-2 drinks a day for men.  Be aware of how much alcohol is in your drink. In the [place] one drink equals one typical bottle of beer (12 oz), one-half glass of wine (5 oz), or one shot of hard liquor (1 oz).  Do not use any products that contain nicotine or tobacco, such as cigarettes and e-cigarettes. If you need help quitting, ask your health care provider. Summary  Having a healthy lifestyle and getting preventive care can help to protect your health and wellness after age 45.  Screening and testing are the best way to find a health problem early and help you avoid having a fall. Early diagnosis and treatment give you the best chance for managing medical conditions that are more common for people who are older than age 5.  Falls are a major cause of broken bones and head injuries in people who are older than age 57. Take precautions to prevent a fall at home.  Work with your health care provider to learn what changes you can make to improve your health and wellness and to prevent falls. This information is not intended to replace advice given to you by your health care provider. Make sure you discuss any questions you have with your health care provider. Document Released: 04/10/2017 Document Revised: 04/10/2017 Document Reviewed: 04/10/2017 Elsevier Interactive Patient Education  2019 [employer].

## 2018-11-19 NOTE — Progress Notes (Signed)
Subjective:    Patient ID: [name] [name], female    DOB: 1950-06-17, 68 y.o.   MRN: [id number]  HPI This is a 68 yo female who presents today for CPE. She has been doing well.   Last CPE- has had regular care and gyn follow up, Dr. Minus Liberty- 08/12/2018 Pap- per gyn Colonoscopy- 05/30/2011 Tdap- 05/09/2011 Flu- annual Eye- annual Dental- regular Exercise- occasional walking and house work  DM- her liraglutide is not well covered by her insurance and is unaffordable for her. She is currently on 1.8 mg daily along with metformin 750 mg daily. Her hemoglobin A1c has been well controlled with last reading at 6.2.   Contact dermatitis- has been treated recently for poison ivy. Came back after she stopped prednisone. Dr. Glori Bickers sent her in a refill yesterday.     Past Medical History:  Diagnosis Date  . Acquired deformity of right foot   . CAD (coronary artery disease) primary cardioloigst-  dr Stanford Breed   hx NSTEMI 10-24-2001  s/p  cardiac cath w/ PCI and DES x1 to first diagonal  . Chronic constipation   . Depression   . Fatty liver 04/26/2015  . GERD (gastroesophageal reflux disease)   . History of abnormal cervical Pap smear    ACUS 2012  . History of exercise stress test 05-25-2014  dr Stanford Breed   borderline (indeterminate) with non-specific ST changes (mild ST depression in leads II,III, aVF, & V6, did not meet criteria for ischemia) ;  pt had low intolerence, no cp, normal heartrate and bp response, no arrhythmias  . History of goiter    as child s/p  removal ,  per pt benign , neck area  (not thyroid)  . History of non-ST elevation myocardial infarction (NSTEMI) 10/24/2001   s/p  PCI and DES to first diagonal  . Hyperlipidemia   . Hypertension   . OSA on CPAP pulmoloigst-  dr young   per study 11-07-2004  severe osa  . Right foot pain   . S/P drug eluting coronary stent placement 10/27/2001   x1 to first diagonal   . Seasonal and perennial allergic rhinitis   . Type  2 diabetes mellitus ([place])   . Vitamin D deficiency   . Wears dentures    upper and lower partial denture  . Wears glasses    Past Surgical History:  Procedure Laterality Date  . BUNIONECTOMY Left 2012  . BUNIONECTOMY Right 04/19/2017   Procedure: RIGHT BUNIONECTOMY;  Surgeon: Rosemary Holms, DPM;  Location: [facility];  Service: Podiatry;  Laterality: Right;  . CARDIAC CATHETERIZATION  09-11-2002   dr Lia Foyer   well-preserved LVF; continued patency previouly placed stent in diagonal branch;  mild luminal irregularities   . CARDIOVASCULAR STRESS TEST  05-16-2011   dr Stanford Breed   normal nuclear study w/ no evidence ischemia/  normal LV function and wall motion , ef 77%  . CORONARY ANGIOPLASTY WITH STENT PLACEMENT  10-27-2001   dr Lia Foyer   high-grade stenosis first diagonal post PCI and DES (TAXUS);  mild luminal irregularity throughout LAD, RCA, and very mild CFx;  preserved LVF  . goiter removal  1968   per pt benign tumor removed from neck beside throat  . HALLUX VALGUS LAPIDUS Right 04/19/2017   Procedure: HALLUX VALGUS LAPIDUS FUSION;  Surgeon: Rosemary Holms, DPM;  Location: [place];  Service: Podiatry;  Laterality: Right;  . HAMMER TOE SURGERY Right 04/19/2017   Procedure: HAMMER TOE CORRECTION RIGHT  2ND;  Surgeon: Rosemary Holms, DPM;  Location[facility];  Service: Podiatry;  Laterality: Right;  . METATARSAL OSTEOTOMY Right 04/19/2017   Procedure: 2ND METATARSAL OSTEOTOMY;  Surgeon: Rosemary Holms, DPM;  Location: [place];  Service: Podiatry;  Laterality: Right;  . TONSILLECTOMY AND ADENOIDECTOMY  age 3  . TUBAL LIGATION Bilateral 1985   Family History  Problem Relation Age of Onset  . Cancer Mother        throat, cervical  . Diabetes Mother   . Stroke Mother   . Heart attack Mother   . Heart disease Mother   . Hypertension Mother   . Thyroid disease Mother   . Alcoholism Mother   . Cancer Father         prostate  . Diabetes Sister   . Heart attack Sister   . Hypertension Sister   . Hyperlipidemia Sister   . Colon cancer Neg Hx   . Breast cancer Neg Hx    Social History   Tobacco Use  . Smoking status: Former Smoker    Years: 46.00    Types: Cigarettes    Last attempt to quit: 09/23/2012    Years since quitting: 6.1  . Smokeless tobacco: Never Used  Substance Use Topics  . Alcohol use: Yes    Alcohol/week: 0.0 standard drinks    Comment: occasional wine  . Drug use: No     Review of Systems  Constitutional: Negative.   HENT: Negative.   Eyes: Negative.   Respiratory: Negative.   Cardiovascular: Negative.   Gastrointestinal: Positive for constipation ("blow outs" with miralax, poor water intake, working on increasing vegetables. ).  Endocrine: Negative.   Genitourinary: Negative.   Musculoskeletal: Negative.   Skin: Positive for rash (recent poision ivy, just got refill of prednisone).  Allergic/Immunologic: Negative.   Neurological: Negative.   Hematological: Negative.   Psychiatric/Behavioral: Negative.        Objective:   Physical Exam Physical Exam  Constitutional: She is oriented to person, place, and time. She appears well-developed and well-nourished. No distress.  HENT:  Head: Normocephalic and atraumatic.  Right Ear: External ear normal.  Left Ear: External ear normal.  Nose: Nose normal.  Mouth/Throat: Oropharynx is clear and moist. No oropharyngeal exudate.  Eyes: Conjunctivae are normal. Pupils are equal, round, and reactive to light.  Neck: Normal range of motion. Neck supple. No JVD present. No thyromegaly present.  Cardiovascular: Normal rate, regular rhythm, normal heart sounds and intact distal pulses.   Pulmonary/Chest: Effort normal and breath sounds normal. Right breast exhibits no inverted nipple, no mass, no nipple discharge, no skin change and no tenderness. Left breast exhibits no inverted nipple, no mass, no nipple discharge, no skin  change and no tenderness. Breasts are symmetrical.  Abdominal: Soft. Bowel sounds are normal. She exhibits no distension and no mass. There is no tenderness. There is no rebound and no guarding.  Lymphadenopathy:    She has no cervical adenopathy.  Neurological: She is alert and oriented to person, place, and time. She has normal reflexes.  Skin: Skin is warm and dry. She is not diaphoretic. Several areas of linear erythema- neck, left lateral abdomen. Left forearm with singular bulla.  Psychiatric: She has a normal mood and affect. Her behavior is normal. Judgment and thought content normal.  Vitals reviewed.    BP 136/72   Pulse 78   Temp 98.3 F (36.8 C)   Resp (!) 22   Ht 5' 2.75" (  1.594 m)   Wt 192 lb 8 oz (87.3 kg)   SpO2 98%   BMI 34.37 kg/m  Wt Readings from Last 3 Encounters:  11/19/18 192 lb 8 oz (87.3 kg)  10/08/18 193 lb 9 oz (87.8 kg)  07/29/18 192 lb 6.4 oz (87.3 kg)   Depression screen [facility] 2/9 11/13/2018 07/14/2018 09/30/2017 09/05/2016 08/22/2016  Decreased Interest 0 0 0 1 0  Down, Depressed, Hopeless 0 0 0 1 0  PHQ - 2 Score 0 0 0 2 0  Altered sleeping 0 - 1 1 0  Tired, decreased energy 0 - 1 3 0  Change in appetite 0 - 0 2 2  Feeling bad or failure about yourself  0 - 0 3 0  Trouble concentrating 0 - 0 3 0  Moving slowly or fidgety/restless 0 - 0 1 0  Suicidal thoughts 0 - 0 0 0  PHQ-9 Score 0 - 2 15 2   Difficult doing work/chores Not difficult at all - - - -  Some recent data might be hidden       Assessment & Plan:  1. Annual physical exam - Discussed and encouraged healthy lifestyle choices- adequate sleep, regular exercise, stress management and healthy food choices.   2. Diabetes mellitus type 2 in obese ([place]) - will wean liraglutide and increase metformin ER 750 mg po BID from QD - she will keep log of blood sugars and send to me via mychart in a couple of weeks - follow up in 3 months - Microalbumin / creatinine urine ratio  3. Plant allergic  contact dermatitis - just started another round of prednisone, will continue to monitor for secondary infection  4. Other constipation - did not tolerate Gwendolyn Lima, discussed using daily fiber and importance of increasing her water intake and eating more fiber   Clarene Reamer, FNP-BC  [facility], [place]  11/19/2018 3:33 PM

## 2018-11-26 ENCOUNTER — Encounter: Payer: Self-pay | Admitting: Family Medicine

## 2018-11-26 DIAGNOSIS — E669 Obesity, unspecified: Secondary | ICD-10-CM

## 2018-11-26 DIAGNOSIS — E1169 Type 2 diabetes mellitus with other specified complication: Secondary | ICD-10-CM

## 2018-11-26 DIAGNOSIS — E119 Type 2 diabetes mellitus without complications: Secondary | ICD-10-CM

## 2018-11-26 MED ORDER — ONETOUCH ULTRA 2 W/DEVICE KIT: PACK | 0 refills | Status: DC

## 2018-11-26 MED ORDER — ONETOUCH ULTRASOFT LANCETS MISC: 11 refills | Status: DC

## 2018-11-26 MED ORDER — GLUCOSE BLOOD VI STRP: 1.0000 | ORAL_STRIP | Freq: Three times a day (TID) | 12 refills | Status: DC

## 2018-11-27 ENCOUNTER — Telehealth: Payer: Self-pay | Admitting: Family Medicine

## 2018-11-27 NOTE — Telephone Encounter (Signed)
Left message asking pt to call office regarding my chart message   Appointment Request From: Tristan Schroeder  With Provider: Elby Beck, [place] [facility] [place]  Preferred Date Range: 02/19/2019 - 02/25/2019  Preferred Times: Any Time  Reason for visit: Office Visit  Comments: Follow up appointment for diabetes, forgot to make appointment before leaving yesterday

## 2018-12-05 DIAGNOSIS — G4733 Obstructive sleep apnea (adult) (pediatric): Secondary | ICD-10-CM | POA: Diagnosis not present

## 2018-12-11 ENCOUNTER — Other Ambulatory Visit: Payer: Self-pay | Admitting: Family Medicine

## 2018-12-12 DIAGNOSIS — F423 Hoarding disorder: Secondary | ICD-10-CM | POA: Diagnosis not present

## 2018-12-15 ENCOUNTER — Other Ambulatory Visit: Payer: Self-pay | Admitting: Family Medicine

## 2018-12-15 ENCOUNTER — Encounter: Payer: Self-pay | Admitting: Family Medicine

## 2018-12-15 DIAGNOSIS — M25551 Pain in right hip: Secondary | ICD-10-CM

## 2018-12-15 NOTE — Telephone Encounter (Signed)
Spoke with patient. She is currently on 0.6 mg Victoza. We had discussed stopping since hgba1c was 6.2 and we increased her metformin. She is checking blood sugars and they are running 140-160. She will use up her current supply and stop Victoza and she will let me know if blood sugars run over 200  Has been having right hip pain x 1 month, pain with putting weight on leg. Xray ordered.

## 2018-12-17 ENCOUNTER — Ambulatory Visit (INDEPENDENT_AMBULATORY_CARE_PROVIDER_SITE_OTHER)
Admission: RE | Admit: 2018-12-17 | Discharge: 2018-12-17 | Disposition: A | Discharge status: Home / Self Care | Payer: PPO | Source: Ambulatory Visit | Attending: Family Medicine | Admitting: Family Medicine

## 2018-12-17 ENCOUNTER — Telehealth: Payer: Self-pay | Admitting: *Deleted

## 2018-12-17 DIAGNOSIS — M25551 Pain in right hip: Secondary | ICD-10-CM | POA: Diagnosis not present

## 2018-12-17 NOTE — Telephone Encounter (Signed)
A message was left, re: follow up visit. 

## 2018-12-31 ENCOUNTER — Encounter: Payer: Self-pay | Admitting: Family Medicine

## 2019-01-03 DIAGNOSIS — F423 Hoarding disorder: Secondary | ICD-10-CM | POA: Diagnosis not present

## 2019-01-05 ENCOUNTER — Ambulatory Visit: Payer: Medicare Other | Admitting: Internal Medicine

## 2019-01-12 DIAGNOSIS — Z01419 Encounter for gynecological examination (general) (routine) without abnormal findings: Secondary | ICD-10-CM | POA: Diagnosis not present

## 2019-01-12 DIAGNOSIS — Z124 Encounter for screening for malignant neoplasm of cervix: Secondary | ICD-10-CM | POA: Diagnosis not present

## 2019-01-12 DIAGNOSIS — N841 Polyp of cervix uteri: Secondary | ICD-10-CM | POA: Diagnosis not present

## 2019-01-12 DIAGNOSIS — N840 Polyp of corpus uteri: Secondary | ICD-10-CM | POA: Diagnosis not present

## 2019-01-12 NOTE — Telephone Encounter (Signed)
Tried to call pt cell phone goes directly to VM left message to call back

## 2019-01-13 ENCOUNTER — Telehealth: Payer: Self-pay

## 2019-01-13 ENCOUNTER — Other Ambulatory Visit: Payer: Self-pay | Admitting: Family Medicine

## 2019-01-13 NOTE — Telephone Encounter (Signed)
Called patient no answer.LMTC. 

## 2019-01-13 NOTE — Telephone Encounter (Signed)
Received call from patient about recent email.Patient stated she has swelling in left lower leg and foot.Stated she saw PCP last week and she was not concerned.Stated swelling has increased.No sob.Stated B/P was elevated at visit with GYN yesterday 162/80. Stated she has been eating salt.Advised to decrease salt intake.Monitor B/P daily.Appointment scheduled with Jory Sims DNP 8/10 at 2:15 pm.Advised to bring B/P readings to appointment.

## 2019-01-14 DIAGNOSIS — M542 Cervicalgia: Secondary | ICD-10-CM | POA: Diagnosis not present

## 2019-01-14 DIAGNOSIS — M9903 Segmental and somatic dysfunction of lumbar region: Secondary | ICD-10-CM | POA: Diagnosis not present

## 2019-01-14 DIAGNOSIS — M9901 Segmental and somatic dysfunction of cervical region: Secondary | ICD-10-CM | POA: Diagnosis not present

## 2019-01-14 DIAGNOSIS — M5136 Other intervertebral disc degeneration, lumbar region: Secondary | ICD-10-CM | POA: Diagnosis not present

## 2019-01-14 NOTE — Telephone Encounter (Signed)
Last time was filled in December 2019 by previous PCP not Jackelyn Poling yet

## 2019-01-15 ENCOUNTER — Other Ambulatory Visit: Payer: Self-pay | Admitting: Family Medicine

## 2019-01-15 ENCOUNTER — Encounter: Payer: Self-pay | Admitting: Family Medicine

## 2019-01-15 DIAGNOSIS — M9901 Segmental and somatic dysfunction of cervical region: Secondary | ICD-10-CM | POA: Diagnosis not present

## 2019-01-15 DIAGNOSIS — M542 Cervicalgia: Secondary | ICD-10-CM | POA: Diagnosis not present

## 2019-01-15 DIAGNOSIS — M9903 Segmental and somatic dysfunction of lumbar region: Secondary | ICD-10-CM | POA: Diagnosis not present

## 2019-01-15 DIAGNOSIS — M5136 Other intervertebral disc degeneration, lumbar region: Secondary | ICD-10-CM | POA: Diagnosis not present

## 2019-01-16 ENCOUNTER — Telehealth: Payer: Self-pay

## 2019-01-16 NOTE — Telephone Encounter (Signed)
Pharmacist with [facility] called to confirm refill authorization on Metoprolol.   Pharmacy did not receive the refill that was sent in on 01/14/19 so I gave verbal authorization of refill that was sent in for #90, 3 refills under Dr. Damita Dunnings.    Order taken and filled for patient.

## 2019-01-18 ENCOUNTER — Other Ambulatory Visit: Payer: Self-pay | Admitting: Psychiatry

## 2019-01-18 NOTE — Progress Notes (Signed)
Cardiology Office Note   Date:  01/19/2019   ID:  [name] [name], [name] [name] 12/10/1950, MRN [id number]  PCP:  Elby Beck, FNP  Cardiologist:  Lubertha South  CC: Lower Extremity Edema    History of Present Illness: [name] [name] is a 68 y.o. female who presents for ongoing assessment of CAD with PCI to the first diagonal in 2003 in the setting of NSTEMI, hyperlipidemia, hypertension,with other hx of OSA and Type II Diabetes. Cardiac cath in 2004 revealing no significant CAD.  She has a history of longstanding dyspnea on exertion.  Her most recent echo in January 2020 revealed an EF of 55 to 60% with a small pericardial effusion, otherwise no significant valvular disease.  On my review obtained at the same time revealed a low risk study with normal perfusion.  She was last seen in the office on 07/31/2018 by Almyra Deforest, PA at which time she continued to have some mild dyspnea on exertion.  Blood pressure was essentially stable and there was no evidence of volume overload.  She was continued on her current medication regimen without changes or additional testing.  She has complaints of bilateral LEE with weight gain. She has chronic back pain and is being followed by a Chiropractor.  X-ray of her spine also revealed extensive plaquing in her abdominal and thoracic aorta. Due to these findings she requested appointment.   She has continued complaints of DOE, which she attributes to weight gain and deconditioning due to back pain. She does not exercise at all now.   Past Medical History:  Diagnosis Date  . Acquired deformity of right foot   . CAD (coronary artery disease) primary cardioloigst-  dr Stanford Breed   hx NSTEMI 10-24-2001  s/p  cardiac cath w/ PCI and DES x1 to first diagonal  . Chronic constipation   . Depression   . Fatty liver 04/26/2015  . GERD (gastroesophageal reflux disease)   . History of abnormal cervical Pap smear    ACUS 2012  . History of exercise stress test 05-25-2014  dr  Stanford Breed   borderline (indeterminate) with non-specific ST changes (mild ST depression in leads II,III, aVF, & V6, did not meet criteria for ischemia) ;  pt had low intolerence, no cp, normal heartrate and bp response, no arrhythmias  . History of goiter    as child s/p  removal ,  per pt benign , neck area  (not thyroid)  . History of non-ST elevation myocardial infarction (NSTEMI) 10/24/2001   s/p  PCI and DES to first diagonal  . Hyperlipidemia   . Hypertension   . OSA on CPAP pulmoloigst-  dr young   per study 11-07-2004  severe osa  . Right foot pain   . S/P drug eluting coronary stent placement 10/27/2001   x1 to first diagonal   . Seasonal and perennial allergic rhinitis   . Type 2 diabetes mellitus ([place])   . Vitamin D deficiency   . Wears dentures    upper and lower partial denture  . Wears glasses     Past Surgical History:  Procedure Laterality Date  . BUNIONECTOMY Left 2012  . BUNIONECTOMY Right 04/19/2017   Procedure: RIGHT BUNIONECTOMY;  Surgeon: Rosemary Holms, DPM;  Location: [facility];  Service: Podiatry;  Laterality: Right;  . CARDIAC CATHETERIZATION  09-11-2002   dr Lia Foyer   well-preserved LVF; continued patency previouly placed stent in diagonal branch;  mild luminal irregularities   . CARDIOVASCULAR STRESS TEST  05-16-2011  dr Stanford Breed   normal nuclear study w/ no evidence ischemia/  normal LV function and wall motion , ef 77%  . CORONARY ANGIOPLASTY WITH STENT PLACEMENT  10-27-2001   dr Lia Foyer   high-grade stenosis first diagonal post PCI and DES (TAXUS);  mild luminal irregularity throughout LAD, RCA, and very mild CFx;  preserved LVF  . goiter removal  1968   per pt benign tumor removed from neck beside throat  . HALLUX VALGUS LAPIDUS Right 04/19/2017   Procedure: HALLUX VALGUS LAPIDUS FUSION;  Surgeon: Rosemary Holms, DPM;  Location: [place];  Service: Podiatry;  Laterality: Right;  . HAMMER TOE SURGERY Right 04/19/2017    Procedure: HAMMER TOE CORRECTION RIGHT 2ND;  Surgeon: Rosemary Holms, DPM;  Location: [facility];  Service: Podiatry;  Laterality: Right;  . METATARSAL OSTEOTOMY Right 04/19/2017   Procedure: 2ND METATARSAL OSTEOTOMY;  Surgeon: Rosemary Holms, DPM;  Location: [place];  Service: Podiatry;  Laterality: Right;  . TONSILLECTOMY AND ADENOIDECTOMY  age 22  . TUBAL LIGATION Bilateral 1985     Current Outpatient Medications  Medication Sig Dispense Refill  . amLODipine (NORVASC) 10 MG tablet TAKE 1 TABLET(10 MG) BY MOUTH DAILY 90 tablet 2  . aspirin 81 MG tablet Take 81 mg by mouth daily.      . Blood Glucose Monitoring Suppl (ONE TOUCH ULTRA 2) w/Device KIT Use to check blood sugar 3 times a day. 1 each 0  . buPROPion (WELLBUTRIN XL) 150 MG 24 hr tablet TAKE 3 TABLETS(450 MG) BY MOUTH EVERY MORNING 90 tablet 5  . Calcium Carbonate-Vitamin D (CALTRATE 600+D) 600-400 MG-UNIT per tablet Take 2 tablets 2 (two) times daily by mouth.     . Cholecalciferol (VITAMIN D3) 3000 units TABS Take 1 tablet by mouth daily. 30 tablet   . glucose blood (ONE TOUCH ULTRA TEST) test strip 1 each by Other route 3 (three) times daily. for testing 100 each 12  . Insulin Pen Needle (BD PEN NEEDLE NANO U/F) 32G X 4 MM MISC USE ONCE DAILY WITH INSULIN INJECTIONS 100 each 1  . Lancets (ONETOUCH ULTRASOFT) lancets Use to check blood sugar 3 times a day. 100 each 11  . meloxicam (MOBIC) 7.5 MG tablet Take by mouth.    . metFORMIN (GLUCOPHAGE-XR) 750 MG 24 hr tablet Take 1 tablet (750 mg total) by mouth 2 (two) times daily with a meal. 180 tablet 1  . metoprolol succinate (TOPROL-XL) 100 MG 24 hr tablet TAKE 1 TABLET BY MOUTH EVERY DAY. TAKE WITH OR IMMEDIATELY FOLLOWING A MEAL 90 tablet 3  . Multiple Vitamins-Minerals (MULTIVITAMIN ADULTS 50+ PO) Take 1 tablet every morning by mouth.     . olmesartan-hydrochlorothiazide (BENICAR HCT) 40-12.5 MG tablet TAKE 1 TABLET BY MOUTH DAILY 90 tablet 3   . pantoprazole (PROTONIX) 40 MG tablet TAKE 1 TABLET(40 MG) BY MOUTH EVERY MORNING 90 tablet 2  . Probiotic Product (PROBIOTIC DAILY PO) Take 1 capsule every morning by mouth.     . rosuvastatin (CRESTOR) 20 MG tablet Take 1 tablet (20 mg total) by mouth daily. 90 tablet 3  . hydrochlorothiazide (MICROZIDE) 12.5 MG capsule Take 1 capsule (12.5 mg total) by mouth as needed. For edema 30 capsule 3   No current facility-administered medications for this visit.     Allergies:   Patient has no known allergies.    Social History:  The patient  reports that she quit smoking about 6 years ago. Her smoking use included  cigarettes. She quit after 46.00 years of use. She has never used smokeless tobacco. She reports current alcohol use. She reports that she does not use drugs.   Family History:  The patient's family history includes Alcoholism in her mother; Cancer in her father and mother; Diabetes in her mother and sister; Heart attack in her mother and sister; Heart disease in her mother; Hyperlipidemia in her sister; Hypertension in her mother and sister; Stroke in her mother; Thyroid disease in her mother.    ROS: All other systems are reviewed and negative. Unless otherwise mentioned in H&P    PHYSICAL EXAM: VS:  BP 138/88   Pulse 85   Temp 98.5 F (36.9 C) (Temporal)   Ht '5\' 3"'  (1.6 m)   Wt 198 lb 9.6 oz (90.1 kg)   SpO2 94%   BMI 35.18 kg/m  , BMI Body mass index is 35.18 kg/m.   GEN: Well nourished, well developed, in no acute distress, obese.  HEENT: normal Neck: no JVD, carotid bruits, or masses Cardiac: RRR; 1/6 systolic murmur, rubs, or gallops,no edema  Respiratory:  Clear to auscultation bilaterally, normal work of breathing GI: soft, nontender, nondistended, + BS MS: no deformity or atrophy Skin: warm and dry, no rash Neuro:  Strength and sensation are intact Psych: euthymic mood, full affect   EKG: Not completed during this office visit.  Recent Labs: 06/09/2018:  Hemoglobin 13.1; Platelets 192.0; TSH 0.93 10/06/2018: ALT 41; BUN 17; Creatinine, Ser 0.79; Potassium 3.8; Sodium 140    Lipid Panel    Component Value Date/Time   CHOL 100 10/06/2018 1058   CHOL 115 08/29/2017 0850   TRIG 154.0 (H) 10/06/2018 1058   HDL 46.10 10/06/2018 1058   HDL 50 08/29/2017 0850   CHOLHDL 2 10/06/2018 1058   VLDL 30.8 10/06/2018 1058   LDLCALC 23 10/06/2018 1058   LDLCALC 33 08/29/2017 0850   LDLDIRECT 77.7 12/27/2009 0942      Wt Readings from Last 3 Encounters:  01/19/19 198 lb 9.6 oz (90.1 kg)  11/19/18 192 lb 8 oz (87.3 kg)  10/08/18 193 lb 9 oz (87.8 kg)      Other studies Reviewed: Echo 07/09/2018 IMPRESSIONS   1. The left ventricle appears to be normal in size, have normal wall thickness, with normal systolic function of 00-17%. Echo evidence of impaired relaxation in diastolic filling patterns. 2. Right ventricular systolic pressure is is mildly elevated. 3. The right ventricle is normal in size, has normal wall thickness and normal systolic function. 4. Normal left atrial size. 5. Normal right atrial size. 6. Small pericardial effusion, as described above. 7. The pericardial effusion is globally pericardial effusion. 8. Mitral valve regurgitation is trivial by color flow Doppler. 9. The mitral valve normal in structure and function. 10. Normal tricuspid valve. 11. Aortic valve normal. 12. The inferior vena cava was normal in size <50% respiratory variablity. 13. No atrial level shunt detected by color flow Doppler.   Myoview 07/10/2018 Study Highlights     Nuclear stress EF: 67%.  The left ventricular ejection fraction is hyperdynamic (>65%).  There was no ST segment deviation noted during stress.  No T wave inversion was noted during stress.  Defect 1: There is a small defect of mild severity present in the mid anterior location.  The study is normal.  This is a low risk study.  Low risk stress nuclear  study with normal perfusion and normal left ventricular regional and global systolic function.  ASSESSMENT AND PLAN:  1.  Chronic lower extremity edema: The patient states that the edema has worsened over the last few months especially since she is beginning to gain weight due to inactivity from chronic back pain and inability to go to the gym due to [place] restrictions.  The patient is on Benicar with 12.5 mg of HCTZ.  Blood pressure is slightly elevated today.  I am getting give her an additional separate dose of HCTZ 12.5 mg to take as needed.  I think some of the lower extremity edema is dependent edema associated with amlodipine and lack of activity.  I have told her that she should only take the HCTZ a maximum of twice a week.  2.  Abnormal spine film: This was completed by her chiropractor, which also revealed extensive atherosclerotic plaque in her thoracic and abdominal aorta.  I do not hear a bruit on exam but she is very concerned about this and therefore I will check an abdominal ultrasound to evaluate for burden of plaquing in the aorta.  The report did not demonstrate any evidence of an aneurysm, most recent echo did not show any aortic dilatation.  3.  Coronary artery disease: History of PCI to the first diagonal, with repeat catheterization revealing no significant CAD.  Repeat Lexiscan Myoview revealed low risk in January 2020 with normal LVEF.  No plan changes in her medication regimen or further ischemic testing at this time.  She will continue aspirin, metoprolol, and statin therapy.  4.  Hyperlipidemia: Labs were completed by PCP within the last 2 months.  Goal of LDL less than 70.  We will try to gain access to her most recent labs.   Current medicines are reviewed at length with the patient today.    Labs/ tests ordered today include: Abdominal ultrasound of the aorta.   Phill Myron. West Pugh, ANP, AACC   01/19/2019 4:43 PM    [facility]  [address] Office [phone] Fax [phone]

## 2019-01-19 ENCOUNTER — Ambulatory Visit (INDEPENDENT_AMBULATORY_CARE_PROVIDER_SITE_OTHER): Payer: PPO | Admitting: Adult Health

## 2019-01-19 ENCOUNTER — Other Ambulatory Visit: Payer: Self-pay

## 2019-01-19 ENCOUNTER — Encounter: Payer: Self-pay | Admitting: Adult Health

## 2019-01-19 VITALS — BP 138/88 | HR 85 | Temp 98.5°F | Ht 63.0 in | Wt 198.6 lb

## 2019-01-19 DIAGNOSIS — I10 Essential (primary) hypertension: Secondary | ICD-10-CM | POA: Diagnosis not present

## 2019-01-19 DIAGNOSIS — I700 Atherosclerosis of aorta: Secondary | ICD-10-CM

## 2019-01-19 DIAGNOSIS — I2510 Atherosclerotic heart disease of native coronary artery without angina pectoris: Secondary | ICD-10-CM | POA: Diagnosis not present

## 2019-01-19 DIAGNOSIS — R609 Edema, unspecified: Secondary | ICD-10-CM | POA: Diagnosis not present

## 2019-01-19 DIAGNOSIS — F423 Hoarding disorder: Secondary | ICD-10-CM | POA: Diagnosis not present

## 2019-01-19 MED ORDER — HYDROCHLOROTHIAZIDE 12.5 MG PO CAPS: 12.5000 mg | ORAL_CAPSULE | ORAL | 3 refills | Status: DC | PRN

## 2019-01-19 NOTE — Patient Instructions (Signed)
Medication Instructions: START- HCTZ 12.5 mg by mouth as needed for Edema  If you need a refill on your cardiac medications before your next appointment, please call your pharmacy.  Labwork: None Ordered   Testing/Procedures: Your physician has requested that you have an abdominal aorta duplex. During this test, an ultrasound is used to evaluate the aorta. Allow 30 minutes for this exam. Do not eat after midnight the day before and avoid carbonated beverages   Special Instructions:  Low-Sodium Eating Plan Sodium, which is an element that makes up salt, helps you maintain a healthy balance of fluids in your body. Too much sodium can increase your blood pressure and cause fluid and waste to be held in your body. Your health care provider or dietitian may recommend following this plan if you have high blood pressure (hypertension), kidney disease, liver disease, or heart failure. Eating less sodium can help lower your blood pressure, reduce swelling, and protect your heart, liver, and kidneys. What are tips for following this plan? General guidelines  Most people on this plan should limit their sodium intake to 1,500-2,000 mg (milligrams) of sodium each day. Reading food labels   The Nutrition Facts label lists the amount of sodium in one serving of the food. If you eat more than one serving, you must multiply the listed amount of sodium by the number of servings.  Choose foods with less than 140 mg of sodium per serving.  Avoid foods with 300 mg of sodium or more per serving. Shopping  Look for lower-sodium products, often labeled as "low-sodium" or "no salt added."  Always check the sodium content even if foods are labeled as "unsalted" or "no salt added".  Buy fresh foods. ? Avoid canned foods and premade or frozen meals. ? Avoid canned, cured, or processed meats  Buy breads that have less than 80 mg of sodium per slice. Cooking  Eat more home-cooked food and less  restaurant, buffet, and fast food.  Avoid adding salt when cooking. Use salt-free seasonings or herbs instead of table salt or sea salt. Check with your health care provider or pharmacist before using salt substitutes.  Cook with plant-based oils, such as canola, sunflower, or olive oil. Meal planning  When eating at a restaurant, ask that your food be prepared with less salt or no salt, if possible.  Avoid foods that contain MSG (monosodium glutamate). MSG is sometimes added to [place] food, bouillon, and some canned foods. What foods are recommended? The items listed may not be a complete list. Talk with your dietitian about what dietary choices are best for you. Grains Low-sodium cereals, including oats, puffed wheat and rice, and shredded wheat. Low-sodium crackers. Unsalted rice. Unsalted pasta. Low-sodium bread. Whole-grain breads and whole-grain pasta. Vegetables Fresh or frozen vegetables. "No salt added" canned vegetables. "No salt added" tomato sauce and paste. Low-sodium or reduced-sodium tomato and vegetable juice. Fruits Fresh, frozen, or canned fruit. Fruit juice. Meats and other protein foods Fresh or frozen (no salt added) meat, poultry, seafood, and fish. Low-sodium canned tuna and salmon. Unsalted nuts. Dried peas, beans, and lentils without added salt. Unsalted canned beans. Eggs. Unsalted nut butters. Dairy Milk. Soy milk. Cheese that is naturally low in sodium, such as ricotta cheese, fresh mozzarella, or Swiss cheese Low-sodium or reduced-sodium cheese. Cream cheese. Yogurt. Fats and oils Unsalted butter. Unsalted margarine with no trans fat. Vegetable oils such as canola or olive oils. Seasonings and other foods Fresh and dried herbs and spices. Salt-free seasonings. Low-sodium  mustard and ketchup. Sodium-free salad dressing. Sodium-free light mayonnaise. Fresh or refrigerated horseradish. Lemon juice. Vinegar. Homemade, reduced-sodium, or low-sodium soups. Unsalted  popcorn and pretzels. Low-salt or salt-free chips. What foods are not recommended? The items listed may not be a complete list. Talk with your dietitian about what dietary choices are best for you. Grains Instant hot cereals. Bread stuffing, pancake, and biscuit mixes. Croutons. Seasoned rice or pasta mixes. Noodle soup cups. Boxed or frozen macaroni and cheese. Regular salted crackers. Self-rising flour. Vegetables Sauerkraut, pickled vegetables, and relishes. Olives. [place] fries. Onion rings. Regular canned vegetables (not low-sodium or reduced-sodium). Regular canned tomato sauce and paste (not low-sodium or reduced-sodium). Regular tomato and vegetable juice (not low-sodium or reduced-sodium). Frozen vegetables in sauces. Meats and other protein foods Meat or fish that is salted, canned, smoked, spiced, or pickled. Bacon, ham, sausage, hotdogs, corned beef, chipped beef, packaged lunch meats, salt pork, jerky, pickled herring, anchovies, regular canned tuna, sardines, salted nuts. Dairy Processed cheese and cheese spreads. Cheese curds. Blue cheese. Feta cheese. String cheese. Regular cottage cheese. Buttermilk. Canned milk. Fats and oils Salted butter. Regular margarine. Ghee. Bacon fat. Seasonings and other foods Onion salt, garlic salt, seasoned salt, table salt, and sea salt. Canned and packaged gravies. Worcestershire sauce. Tartar sauce. Barbecue sauce. Teriyaki sauce. Soy sauce, including reduced-sodium. Steak sauce. Fish sauce. Oyster sauce. Cocktail sauce. Horseradish that you find on the shelf. Regular ketchup and mustard. Meat flavorings and tenderizers. Bouillon cubes. Hot sauce and Tabasco sauce. Premade or packaged marinades. Premade or packaged taco seasonings. Relishes. Regular salad dressings. Salsa. Potato and tortilla chips. Corn chips and puffs. Salted popcorn and pretzels. Canned or dried soups. Pizza. Frozen entrees and pot pies. Summary  Eating less sodium can help lower  your blood pressure, reduce swelling, and protect your heart, liver, and kidneys.  Most people on this plan should limit their sodium intake to 1,500-2,000 mg (milligrams) of sodium each day.  Canned, boxed, and frozen foods are high in sodium. Restaurant foods, fast foods, and pizza are also very high in sodium. You also get sodium by adding salt to food.  Try to cook at home, eat more fresh fruits and vegetables, and eat less fast food, canned, processed, or prepared foods. This information is not intended to replace advice given to you by your health care provider. Make sure you discuss any questions you have with your health care provider. Document Released: 11/17/2001 Document Revised: 05/10/2017 Document Reviewed: 05/21/2016 Elsevier Patient Education  [place]:  Keep appointment with Dr Stanford Breed in September   At [facility], you and your health needs are our priority.  As part of our continuing mission to provide you with exceptional heart care, we have created designated Provider Care Teams.  These Care Teams include your primary Cardiologist (physician) and Advanced Practice Providers (APPs -  Physician Assistants and Nurse Practitioners) who all work together to provide you with the care you need, when you need it.  Thank you for choosing [facility] at [facility]!!

## 2019-01-20 DIAGNOSIS — M5136 Other intervertebral disc degeneration, lumbar region: Secondary | ICD-10-CM | POA: Diagnosis not present

## 2019-01-20 DIAGNOSIS — M9903 Segmental and somatic dysfunction of lumbar region: Secondary | ICD-10-CM | POA: Diagnosis not present

## 2019-01-20 DIAGNOSIS — M9901 Segmental and somatic dysfunction of cervical region: Secondary | ICD-10-CM | POA: Diagnosis not present

## 2019-01-20 DIAGNOSIS — M542 Cervicalgia: Secondary | ICD-10-CM | POA: Diagnosis not present

## 2019-01-21 ENCOUNTER — Encounter: Payer: Self-pay | Admitting: *Deleted

## 2019-01-21 DIAGNOSIS — M9903 Segmental and somatic dysfunction of lumbar region: Secondary | ICD-10-CM | POA: Diagnosis not present

## 2019-01-21 DIAGNOSIS — M542 Cervicalgia: Secondary | ICD-10-CM | POA: Diagnosis not present

## 2019-01-21 DIAGNOSIS — M9901 Segmental and somatic dysfunction of cervical region: Secondary | ICD-10-CM | POA: Diagnosis not present

## 2019-01-21 DIAGNOSIS — M5136 Other intervertebral disc degeneration, lumbar region: Secondary | ICD-10-CM | POA: Diagnosis not present

## 2019-01-22 ENCOUNTER — Ambulatory Visit (HOSPITAL_COMMUNITY)
Admission: RE | Admit: 2019-01-22 | Discharge: 2019-01-22 | Disposition: A | Discharge status: Home / Self Care | Payer: PPO | Source: Ambulatory Visit | Attending: Cardiovascular Disease | Admitting: Cardiovascular Disease

## 2019-01-22 ENCOUNTER — Other Ambulatory Visit: Payer: Self-pay

## 2019-01-22 DIAGNOSIS — I700 Atherosclerosis of aorta: Secondary | ICD-10-CM | POA: Insufficient documentation

## 2019-01-22 DIAGNOSIS — M5136 Other intervertebral disc degeneration, lumbar region: Secondary | ICD-10-CM | POA: Diagnosis not present

## 2019-01-22 DIAGNOSIS — M9901 Segmental and somatic dysfunction of cervical region: Secondary | ICD-10-CM | POA: Diagnosis not present

## 2019-01-22 DIAGNOSIS — M542 Cervicalgia: Secondary | ICD-10-CM | POA: Diagnosis not present

## 2019-01-22 DIAGNOSIS — M9903 Segmental and somatic dysfunction of lumbar region: Secondary | ICD-10-CM | POA: Diagnosis not present

## 2019-01-26 DIAGNOSIS — F423 Hoarding disorder: Secondary | ICD-10-CM | POA: Diagnosis not present

## 2019-01-27 ENCOUNTER — Encounter: Payer: Self-pay | Admitting: Family Medicine

## 2019-01-27 DIAGNOSIS — M9901 Segmental and somatic dysfunction of cervical region: Secondary | ICD-10-CM | POA: Diagnosis not present

## 2019-01-27 DIAGNOSIS — M542 Cervicalgia: Secondary | ICD-10-CM | POA: Diagnosis not present

## 2019-01-27 DIAGNOSIS — M5136 Other intervertebral disc degeneration, lumbar region: Secondary | ICD-10-CM | POA: Diagnosis not present

## 2019-01-27 DIAGNOSIS — M9903 Segmental and somatic dysfunction of lumbar region: Secondary | ICD-10-CM | POA: Diagnosis not present

## 2019-01-28 ENCOUNTER — Ambulatory Visit (INDEPENDENT_AMBULATORY_CARE_PROVIDER_SITE_OTHER): Payer: PPO | Admitting: Family Medicine

## 2019-01-28 ENCOUNTER — Encounter: Payer: Self-pay | Admitting: Family Medicine

## 2019-01-28 ENCOUNTER — Ambulatory Visit
Admission: RE | Admit: 2019-01-28 | Discharge: 2019-01-28 | Disposition: A | Discharge status: Home / Self Care | Payer: PPO | Source: Ambulatory Visit | Attending: Family Medicine | Admitting: Family Medicine

## 2019-01-28 ENCOUNTER — Other Ambulatory Visit: Payer: Self-pay

## 2019-01-28 VITALS — BP 118/70 | HR 72 | Temp 97.9°F | Ht 62.75 in | Wt 198.4 lb

## 2019-01-28 DIAGNOSIS — R0600 Dyspnea, unspecified: Secondary | ICD-10-CM

## 2019-01-28 DIAGNOSIS — M9903 Segmental and somatic dysfunction of lumbar region: Secondary | ICD-10-CM | POA: Diagnosis not present

## 2019-01-28 DIAGNOSIS — M7989 Other specified soft tissue disorders: Secondary | ICD-10-CM | POA: Diagnosis not present

## 2019-01-28 DIAGNOSIS — R600 Localized edema: Secondary | ICD-10-CM | POA: Diagnosis not present

## 2019-01-28 DIAGNOSIS — M5136 Other intervertebral disc degeneration, lumbar region: Secondary | ICD-10-CM | POA: Diagnosis not present

## 2019-01-28 DIAGNOSIS — M9901 Segmental and somatic dysfunction of cervical region: Secondary | ICD-10-CM | POA: Diagnosis not present

## 2019-01-28 DIAGNOSIS — E1169 Type 2 diabetes mellitus with other specified complication: Secondary | ICD-10-CM

## 2019-01-28 DIAGNOSIS — E669 Obesity, unspecified: Secondary | ICD-10-CM

## 2019-01-28 DIAGNOSIS — R0609 Other forms of dyspnea: Secondary | ICD-10-CM | POA: Diagnosis not present

## 2019-01-28 DIAGNOSIS — M542 Cervicalgia: Secondary | ICD-10-CM | POA: Diagnosis not present

## 2019-01-28 DIAGNOSIS — R350 Frequency of micturition: Secondary | ICD-10-CM | POA: Diagnosis not present

## 2019-01-28 DIAGNOSIS — N3090 Cystitis, unspecified without hematuria: Secondary | ICD-10-CM | POA: Diagnosis not present

## 2019-01-28 LAB — POCT GLYCOSYLATED HEMOGLOBIN (HGB A1C): Hemoglobin A1C: 6.4 % — AB (ref 4.0–5.6)

## 2019-01-28 LAB — POC URINALSYSI DIPSTICK (AUTOMATED)
Bilirubin, UA: NEGATIVE
Blood, UA: NEGATIVE
Glucose, UA: NEGATIVE
Ketones, UA: POSITIVE
Nitrite, UA: NEGATIVE
Protein, UA: POSITIVE — AB
Spec Grav, UA: 1.025 (ref 1.010–1.025)
Urobilinogen, UA: 0.2 E.U./dL
pH, UA: 5 (ref 5.0–8.0)

## 2019-01-28 MED ORDER — NITROFURANTOIN MONOHYD MACRO 100 MG PO CAPS: 100.0000 mg | ORAL_CAPSULE | Freq: Two times a day (BID) | ORAL | 0 refills | Status: DC

## 2019-01-28 NOTE — Progress Notes (Signed)
Subjective:    Patient ID: [name] [name], female    DOB: [birth year], 68 y.o.   MRN: [id number]  HPI This is a 68 yo female who presents today with worsening swelling in her left ankle. This is not painful but feels tight. Some calf discomfort.  She has had a long history of swelling of her left foot and ankle that was never painful.  In the last 3 to 5 weeks the swelling has increased and is now extending almost to her knee.  She does not have any redness or feel any masses.  She was seen by cardiology 9 days ago and was started on hydrochlorothiazide 12.5 mg without any improvement of leg swelling.  Some SOB with exertion and tightness in her arms with activity.  Looking back, she was seen by cardiology in January of this year for dyspnea on exertion and a Myoview was done that was normal.  She reports that her arms feel fatigued when she is doing activities with her arms over her head.  She is not sure if the symptoms are related to deconditioning and being sedentary as well as recent weight gain.  She has had an MI in the past and with that she had symptoms of chest pressure and neck pain.  She denies having any similar symptoms now.  She is not having any chest pain.  She has upcoming appointment with cardiology next month.  DM- weight up some. She continues to struggle some in retirement with not being on a regular schedule. She has been having 1-2 individual servings of ice cream daily. She is eating Special K protein cereal with toast for breakfast, today had [employer] sub for lunch. Is hungry after dinner. She is having a daily iced Chai tea from [employer]. She is checking blood sugars and they are ranging from 74-1 90, with most values being less than 150.  She reports urinary frequency and feeling like she has small amounts of urine.  Nocturia ranges from 1-3.  She admits to not having very good water intake.  No nausea, vomiting, new back pain.   Past Medical History:  Diagnosis Date   . Acquired deformity of right foot   . CAD (coronary artery disease) primary cardioloigst-  dr Stanford Breed   hx NSTEMI 10-24-2001  s/p  cardiac cath w/ PCI and DES x1 to first diagonal  . Chronic constipation   . Depression   . Fatty liver 04/26/2015  . GERD (gastroesophageal reflux disease)   . History of abnormal cervical Pap smear    ACUS 2012  . History of exercise stress test 05-25-2014  dr Stanford Breed   borderline (indeterminate) with non-specific ST changes (mild ST depression in leads II,III, aVF, & V6, did not meet criteria for ischemia) ;  pt had low intolerence, no cp, normal heartrate and bp response, no arrhythmias  . History of goiter    as child s/p  removal ,  per pt benign , neck area  (not thyroid)  . History of non-ST elevation myocardial infarction (NSTEMI) 10/24/2001   s/p  PCI and DES to first diagonal  . Hyperlipidemia   . Hypertension   . OSA on CPAP pulmoloigst-  dr young   per study 11-07-2004  severe osa  . Right foot pain   . S/P drug eluting coronary stent placement 10/27/2001   x1 to first diagonal   . Seasonal and perennial allergic rhinitis   . Type 2 diabetes mellitus ([place])   .  Vitamin D deficiency   . Wears dentures    upper and lower partial denture  . Wears glasses    Past Surgical History:  Procedure Laterality Date  . BUNIONECTOMY Left 2012  . BUNIONECTOMY Right 04/19/2017   Procedure: RIGHT BUNIONECTOMY;  Surgeon: Rosemary Holms, DPM;  Location: [facility];  Service: Podiatry;  Laterality: Right;  . CARDIAC CATHETERIZATION  09-11-2002   dr Lia Foyer   well-preserved LVF; continued patency previouly placed stent in diagonal branch;  mild luminal irregularities   . CARDIOVASCULAR STRESS TEST  05-16-2011   dr Stanford Breed   normal nuclear study w/ no evidence ischemia/  normal LV function and wall motion , ef 77%  . CORONARY ANGIOPLASTY WITH STENT PLACEMENT  10-27-2001   dr Lia Foyer   high-grade stenosis first diagonal post PCI and DES  (TAXUS);  mild luminal irregularity throughout LAD, RCA, and very mild CFx;  preserved LVF  . goiter removal  1968   per pt benign tumor removed from neck beside throat  . HALLUX VALGUS LAPIDUS Right 04/19/2017   Procedure: HALLUX VALGUS LAPIDUS FUSION;  Surgeon: Rosemary Holms, DPM;  Location: [place];  Service: Podiatry;  Laterality: Right;  . HAMMER TOE SURGERY Right 04/19/2017   Procedure: HAMMER TOE CORRECTION RIGHT 2ND;  Surgeon: Rosemary Holms, DPM;  Location: [facility];  Service: Podiatry;  Laterality: Right;  . METATARSAL OSTEOTOMY Right 04/19/2017   Procedure: 2ND METATARSAL OSTEOTOMY;  Surgeon: Rosemary Holms, DPM;  Location: [place];  Service: Podiatry;  Laterality: Right;  . TONSILLECTOMY AND ADENOIDECTOMY  age 38  . TUBAL LIGATION Bilateral 1985   Family History  Problem Relation Age of Onset  . Cancer Mother        throat, cervical  . Diabetes Mother   . Stroke Mother   . Heart attack Mother   . Heart disease Mother   . Hypertension Mother   . Thyroid disease Mother   . Alcoholism Mother   . Cancer Father        prostate  . Diabetes Sister   . Heart attack Sister   . Hypertension Sister   . Hyperlipidemia Sister   . Colon cancer Neg Hx   . Breast cancer Neg Hx    Social History   Tobacco Use  . Smoking status: Former Smoker    Years: 46.00    Types: Cigarettes    Quit date: 09/23/2012    Years since quitting: 6.3  . Smokeless tobacco: Never Used  Substance Use Topics  . Alcohol use: Yes    Alcohol/week: 0.0 standard drinks    Comment: occasional wine  . Drug use: No      Review of Systems Per HPI    Objective:   Physical Exam Vitals signs reviewed.  Constitutional:      General: She is not in acute distress.    Appearance: Normal appearance. She is obese. She is not ill-appearing or toxic-appearing.  HENT:     Head: Normocephalic.  Eyes:     Conjunctiva/sclera: Conjunctivae normal.   Cardiovascular:     Rate and Rhythm: Normal rate and regular rhythm.     Heart sounds: Normal heart sounds.  Pulmonary:     Effort: Pulmonary effort is normal.     Breath sounds: Normal breath sounds.  Neurological:     Mental Status: She is alert.       BP 118/70 (BP Location: Left Arm, Patient Position: Sitting, Cuff Size: Normal)  Pulse 72   Temp 97.9 F (36.6 C) (Temporal)   Ht 5' 2.75" (1.594 m)   Wt 198 lb 6.4 oz (90 kg)   SpO2 95%   BMI 35.43 kg/m  Wt Readings from Last 3 Encounters:  01/28/19 198 lb 6.4 oz (90 kg)  01/19/19 198 lb 9.6 oz (90.1 kg)  11/19/18 192 lb 8 oz (87.3 kg)   Results for orders placed or performed in visit on 01/28/19  POCT Urinalysis Dipstick (Automated)  Result Value Ref Range   Color, UA yellow    Clarity, UA clear    Glucose, UA Negative Negative   Bilirubin, UA negativen    Ketones, UA positive    Spec Grav, UA 1.025 1.010 - 1.025   Blood, UA negative    pH, UA 5.0 5.0 - 8.0   Protein, UA Positive (A) Negative   Urobilinogen, UA 0.2 0.2 or 1.0 E.U./dL   Nitrite, UA negative    Leukocytes, UA Moderate (2+) (A) Negative   EKG- sinus rhythm, rate 60, PR 178, QT 386, compared to previous tracings, no change    Assessment & Plan:  1. Left leg swelling -Progressive swelling and calf pain concerning for possible DVT, will send her for Doppler ultrasound -She was instructed to elevate leg while sitting - US Venous Img Lower Unilateral Left; Future  2. Dyspnea on exertion -Symptoms seem consistent over the last many months, possibly worse with weight gain and deconditioning -I will send the cardiology PA who she has an appoint with next month a note to see if he thinks earlier follow-up is warranted - EKG 12-Lead  3. Urinary frequency - POCT Urinalysis Dipstick (Automated)  4. Diabetes mellitus type 2 in obese [facility]) -Encouraged her to make better food choices, decrease carbs and increase protein at breakfast, eat less bread,  reduce nightly dessert to no more than 1 serving 3 nights a week. - POCT glycosylated hemoglobin (Hb A1C)  5. Cystitis - nitrofurantoin, macrocrystal-monohydrate, (MACROBID) 100 MG capsule; Take 1 capsule (100 mg total) by mouth 2 (two) times daily.  Dispense: 14 capsule; Refill: 0 - Urine Culture   Clarene Reamer, FNP-BC  [facility]  01/28/2019 5:13 PM

## 2019-01-28 NOTE — Patient Instructions (Addendum)
Good to see you today  I will notify you of ultrasound results  Keep your leg elevated when sitting  After we get results back, increase your walking- start with 10 minutes twice a day and work up to 30 minutes at a time  Increase your water intake- urine should be light yellow  Limit ice cream to 1, no more than 3 times a week.

## 2019-01-29 DIAGNOSIS — M5136 Other intervertebral disc degeneration, lumbar region: Secondary | ICD-10-CM | POA: Diagnosis not present

## 2019-01-29 DIAGNOSIS — M9903 Segmental and somatic dysfunction of lumbar region: Secondary | ICD-10-CM | POA: Diagnosis not present

## 2019-01-29 DIAGNOSIS — M9901 Segmental and somatic dysfunction of cervical region: Secondary | ICD-10-CM | POA: Diagnosis not present

## 2019-01-29 DIAGNOSIS — M542 Cervicalgia: Secondary | ICD-10-CM | POA: Diagnosis not present

## 2019-01-29 LAB — URINE CULTURE
MICRO NUMBER:: 788714
SPECIMEN QUALITY:: ADEQUATE

## 2019-01-30 ENCOUNTER — Encounter: Payer: Self-pay | Admitting: Family Medicine

## 2019-02-02 DIAGNOSIS — F423 Hoarding disorder: Secondary | ICD-10-CM | POA: Diagnosis not present

## 2019-02-03 DIAGNOSIS — M542 Cervicalgia: Secondary | ICD-10-CM | POA: Diagnosis not present

## 2019-02-03 DIAGNOSIS — M5136 Other intervertebral disc degeneration, lumbar region: Secondary | ICD-10-CM | POA: Diagnosis not present

## 2019-02-03 DIAGNOSIS — M9901 Segmental and somatic dysfunction of cervical region: Secondary | ICD-10-CM | POA: Diagnosis not present

## 2019-02-03 DIAGNOSIS — M9903 Segmental and somatic dysfunction of lumbar region: Secondary | ICD-10-CM | POA: Diagnosis not present

## 2019-02-04 DIAGNOSIS — M9901 Segmental and somatic dysfunction of cervical region: Secondary | ICD-10-CM | POA: Diagnosis not present

## 2019-02-04 DIAGNOSIS — M9903 Segmental and somatic dysfunction of lumbar region: Secondary | ICD-10-CM | POA: Diagnosis not present

## 2019-02-04 DIAGNOSIS — M5136 Other intervertebral disc degeneration, lumbar region: Secondary | ICD-10-CM | POA: Diagnosis not present

## 2019-02-04 DIAGNOSIS — M542 Cervicalgia: Secondary | ICD-10-CM | POA: Diagnosis not present

## 2019-02-05 DIAGNOSIS — M5136 Other intervertebral disc degeneration, lumbar region: Secondary | ICD-10-CM | POA: Diagnosis not present

## 2019-02-05 DIAGNOSIS — M542 Cervicalgia: Secondary | ICD-10-CM | POA: Diagnosis not present

## 2019-02-05 DIAGNOSIS — M9903 Segmental and somatic dysfunction of lumbar region: Secondary | ICD-10-CM | POA: Diagnosis not present

## 2019-02-05 DIAGNOSIS — M9901 Segmental and somatic dysfunction of cervical region: Secondary | ICD-10-CM | POA: Diagnosis not present

## 2019-02-10 DIAGNOSIS — M9903 Segmental and somatic dysfunction of lumbar region: Secondary | ICD-10-CM | POA: Diagnosis not present

## 2019-02-10 DIAGNOSIS — M9901 Segmental and somatic dysfunction of cervical region: Secondary | ICD-10-CM | POA: Diagnosis not present

## 2019-02-10 DIAGNOSIS — M5136 Other intervertebral disc degeneration, lumbar region: Secondary | ICD-10-CM | POA: Diagnosis not present

## 2019-02-10 DIAGNOSIS — M542 Cervicalgia: Secondary | ICD-10-CM | POA: Diagnosis not present

## 2019-02-11 ENCOUNTER — Encounter: Payer: Self-pay | Admitting: Family Medicine

## 2019-02-11 ENCOUNTER — Other Ambulatory Visit: Payer: Self-pay | Admitting: Family Medicine

## 2019-02-11 DIAGNOSIS — L03116 Cellulitis of left lower limb: Secondary | ICD-10-CM

## 2019-02-11 DIAGNOSIS — M9903 Segmental and somatic dysfunction of lumbar region: Secondary | ICD-10-CM | POA: Diagnosis not present

## 2019-02-11 DIAGNOSIS — M542 Cervicalgia: Secondary | ICD-10-CM | POA: Diagnosis not present

## 2019-02-11 DIAGNOSIS — M9901 Segmental and somatic dysfunction of cervical region: Secondary | ICD-10-CM | POA: Diagnosis not present

## 2019-02-11 DIAGNOSIS — M5136 Other intervertebral disc degeneration, lumbar region: Secondary | ICD-10-CM | POA: Diagnosis not present

## 2019-02-11 MED ORDER — SULFAMETHOXAZOLE-TRIMETHOPRIM 800-160 MG PO TABS: 1.0000 | ORAL_TABLET | Freq: Two times a day (BID) | ORAL | 0 refills | Status: AC

## 2019-02-12 DIAGNOSIS — M5136 Other intervertebral disc degeneration, lumbar region: Secondary | ICD-10-CM | POA: Diagnosis not present

## 2019-02-12 DIAGNOSIS — M9901 Segmental and somatic dysfunction of cervical region: Secondary | ICD-10-CM | POA: Diagnosis not present

## 2019-02-12 DIAGNOSIS — M542 Cervicalgia: Secondary | ICD-10-CM | POA: Diagnosis not present

## 2019-02-12 DIAGNOSIS — M9903 Segmental and somatic dysfunction of lumbar region: Secondary | ICD-10-CM | POA: Diagnosis not present

## 2019-02-13 NOTE — Telephone Encounter (Signed)
Called and spoke with patient. Swelling about the same. She just picked up antibiotic and will let me know at the beginning of next week if no improvement, will get xray.

## 2019-02-17 ENCOUNTER — Other Ambulatory Visit: Payer: Self-pay | Admitting: Family Medicine

## 2019-02-17 DIAGNOSIS — M9901 Segmental and somatic dysfunction of cervical region: Secondary | ICD-10-CM | POA: Diagnosis not present

## 2019-02-17 DIAGNOSIS — M5136 Other intervertebral disc degeneration, lumbar region: Secondary | ICD-10-CM | POA: Diagnosis not present

## 2019-02-17 DIAGNOSIS — M542 Cervicalgia: Secondary | ICD-10-CM | POA: Diagnosis not present

## 2019-02-17 DIAGNOSIS — M7989 Other specified soft tissue disorders: Secondary | ICD-10-CM

## 2019-02-17 DIAGNOSIS — M9903 Segmental and somatic dysfunction of lumbar region: Secondary | ICD-10-CM | POA: Diagnosis not present

## 2019-02-18 ENCOUNTER — Ambulatory Visit (INDEPENDENT_AMBULATORY_CARE_PROVIDER_SITE_OTHER)
Admission: RE | Admit: 2019-02-18 | Discharge: 2019-02-18 | Disposition: A | Discharge status: Home / Self Care | Payer: PPO | Source: Ambulatory Visit | Attending: Family Medicine | Admitting: Family Medicine

## 2019-02-18 DIAGNOSIS — M7989 Other specified soft tissue disorders: Secondary | ICD-10-CM

## 2019-02-18 DIAGNOSIS — R600 Localized edema: Secondary | ICD-10-CM | POA: Diagnosis not present

## 2019-02-19 ENCOUNTER — Encounter: Payer: Self-pay | Admitting: Family Medicine

## 2019-02-19 DIAGNOSIS — M9903 Segmental and somatic dysfunction of lumbar region: Secondary | ICD-10-CM | POA: Diagnosis not present

## 2019-02-19 DIAGNOSIS — M542 Cervicalgia: Secondary | ICD-10-CM | POA: Diagnosis not present

## 2019-02-19 DIAGNOSIS — M5136 Other intervertebral disc degeneration, lumbar region: Secondary | ICD-10-CM | POA: Diagnosis not present

## 2019-02-19 DIAGNOSIS — M9901 Segmental and somatic dysfunction of cervical region: Secondary | ICD-10-CM | POA: Diagnosis not present

## 2019-02-24 ENCOUNTER — Encounter: Payer: Self-pay | Admitting: Family Medicine

## 2019-02-24 DIAGNOSIS — M5136 Other intervertebral disc degeneration, lumbar region: Secondary | ICD-10-CM | POA: Diagnosis not present

## 2019-02-24 DIAGNOSIS — M542 Cervicalgia: Secondary | ICD-10-CM | POA: Diagnosis not present

## 2019-02-24 DIAGNOSIS — M9901 Segmental and somatic dysfunction of cervical region: Secondary | ICD-10-CM | POA: Diagnosis not present

## 2019-02-24 DIAGNOSIS — M9903 Segmental and somatic dysfunction of lumbar region: Secondary | ICD-10-CM | POA: Diagnosis not present

## 2019-02-25 ENCOUNTER — Other Ambulatory Visit: Payer: Self-pay | Admitting: Family Medicine

## 2019-02-25 MED ORDER — MELOXICAM 7.5 MG PO TABS: 7.5000 mg | ORAL_TABLET | Freq: Every day | ORAL | 1 refills | Status: DC | PRN

## 2019-02-26 DIAGNOSIS — M9901 Segmental and somatic dysfunction of cervical region: Secondary | ICD-10-CM | POA: Diagnosis not present

## 2019-02-26 DIAGNOSIS — M9903 Segmental and somatic dysfunction of lumbar region: Secondary | ICD-10-CM | POA: Diagnosis not present

## 2019-02-26 DIAGNOSIS — M5136 Other intervertebral disc degeneration, lumbar region: Secondary | ICD-10-CM | POA: Diagnosis not present

## 2019-02-26 DIAGNOSIS — M542 Cervicalgia: Secondary | ICD-10-CM | POA: Diagnosis not present

## 2019-03-02 ENCOUNTER — Encounter: Payer: Self-pay | Admitting: Family Medicine

## 2019-03-03 DIAGNOSIS — M9901 Segmental and somatic dysfunction of cervical region: Secondary | ICD-10-CM | POA: Diagnosis not present

## 2019-03-03 DIAGNOSIS — M542 Cervicalgia: Secondary | ICD-10-CM | POA: Diagnosis not present

## 2019-03-03 DIAGNOSIS — M5136 Other intervertebral disc degeneration, lumbar region: Secondary | ICD-10-CM | POA: Diagnosis not present

## 2019-03-03 DIAGNOSIS — M9903 Segmental and somatic dysfunction of lumbar region: Secondary | ICD-10-CM | POA: Diagnosis not present

## 2019-03-05 DIAGNOSIS — M5136 Other intervertebral disc degeneration, lumbar region: Secondary | ICD-10-CM | POA: Diagnosis not present

## 2019-03-05 DIAGNOSIS — M9903 Segmental and somatic dysfunction of lumbar region: Secondary | ICD-10-CM | POA: Diagnosis not present

## 2019-03-05 DIAGNOSIS — M542 Cervicalgia: Secondary | ICD-10-CM | POA: Diagnosis not present

## 2019-03-05 DIAGNOSIS — M9901 Segmental and somatic dysfunction of cervical region: Secondary | ICD-10-CM | POA: Diagnosis not present

## 2019-03-10 ENCOUNTER — Other Ambulatory Visit: Payer: Self-pay

## 2019-03-10 ENCOUNTER — Ambulatory Visit: Payer: PPO | Admitting: Cardiology

## 2019-03-10 ENCOUNTER — Encounter: Payer: Self-pay | Admitting: Physician Assistant

## 2019-03-10 ENCOUNTER — Ambulatory Visit: Payer: PPO | Admitting: General Practice

## 2019-03-10 VITALS — BP 136/72 | HR 61 | Temp 96.8°F | Ht 63.0 in | Wt 198.4 lb

## 2019-03-10 DIAGNOSIS — I2510 Atherosclerotic heart disease of native coronary artery without angina pectoris: Secondary | ICD-10-CM | POA: Diagnosis not present

## 2019-03-10 DIAGNOSIS — M542 Cervicalgia: Secondary | ICD-10-CM | POA: Diagnosis not present

## 2019-03-10 DIAGNOSIS — M5136 Other intervertebral disc degeneration, lumbar region: Secondary | ICD-10-CM | POA: Diagnosis not present

## 2019-03-10 DIAGNOSIS — E785 Hyperlipidemia, unspecified: Secondary | ICD-10-CM | POA: Diagnosis not present

## 2019-03-10 DIAGNOSIS — I10 Essential (primary) hypertension: Secondary | ICD-10-CM | POA: Diagnosis not present

## 2019-03-10 DIAGNOSIS — M9903 Segmental and somatic dysfunction of lumbar region: Secondary | ICD-10-CM | POA: Diagnosis not present

## 2019-03-10 DIAGNOSIS — R600 Localized edema: Secondary | ICD-10-CM | POA: Diagnosis not present

## 2019-03-10 DIAGNOSIS — M9901 Segmental and somatic dysfunction of cervical region: Secondary | ICD-10-CM | POA: Diagnosis not present

## 2019-03-10 NOTE — Progress Notes (Signed)
Cardiology Clinic Note   Patient Name: [name] [name] Date of Encounter: 03/10/2019  Primary Care Provider:  Elby Beck, FNP Primary Cardiologist:  Kirk Ruths, MD  Patient Profile    [name] [name] 68 y.o. female presents for follow up for her HTN, CAD,and HLD.   Past Medical History    Past Medical History:  Diagnosis Date  . Acquired deformity of right foot   . CAD (coronary artery disease) primary cardioloigst-  dr Stanford Breed   hx NSTEMI 10-24-2001  s/p  cardiac cath w/ PCI and DES x1 to first diagonal  . Chronic constipation   . Depression   . Fatty liver 04/26/2015  . GERD (gastroesophageal reflux disease)   . History of abnormal cervical Pap smear    ACUS 2012  . History of exercise stress test 05-25-2014  dr Stanford Breed   borderline (indeterminate) with non-specific ST changes (mild ST depression in leads II,III, aVF, & V6, did not meet criteria for ischemia) ;  pt had low intolerence, no cp, normal heartrate and bp response, no arrhythmias  . History of goiter    as child s/p  removal ,  per pt benign , neck area  (not thyroid)  . History of non-ST elevation myocardial infarction (NSTEMI) 10/24/2001   s/p  PCI and DES to first diagonal  . Hyperlipidemia   . Hypertension   . OSA on CPAP pulmoloigst-  dr young   per study 11-07-2004  severe osa  . Right foot pain   . S/P drug eluting coronary stent placement 10/27/2001   x1 to first diagonal   . Seasonal and perennial allergic rhinitis   . Type 2 diabetes mellitus ([place])   . Vitamin D deficiency   . Wears dentures    upper and lower partial denture  . Wears glasses    Past Surgical History:  Procedure Laterality Date  . BUNIONECTOMY Left 2012  . BUNIONECTOMY Right 04/19/2017   Procedure: RIGHT BUNIONECTOMY;  Surgeon: Rosemary Holms, DPM;  Location: [facility];  Service: Podiatry;  Laterality: Right;  . CARDIAC CATHETERIZATION  09-11-2002   dr Lia Foyer   well-preserved LVF; continued  patency previouly placed stent in diagonal branch;  mild luminal irregularities   . CARDIOVASCULAR STRESS TEST  05-16-2011   dr Stanford Breed   normal nuclear study w/ no evidence ischemia/  normal LV function and wall motion , ef 77%  . CORONARY ANGIOPLASTY WITH STENT PLACEMENT  10-27-2001   dr Lia Foyer   high-grade stenosis first diagonal post PCI and DES (TAXUS);  mild luminal irregularity throughout LAD, RCA, and very mild CFx;  preserved LVF  . goiter removal  1968   per pt benign tumor removed from neck beside throat  . HALLUX VALGUS LAPIDUS Right 04/19/2017   Procedure: HALLUX VALGUS LAPIDUS FUSION;  Surgeon: Rosemary Holms, DPM;  Location: [place];  Service: Podiatry;  Laterality: Right;  . HAMMER TOE SURGERY Right 04/19/2017   Procedure: HAMMER TOE CORRECTION RIGHT 2ND;  Surgeon: Rosemary Holms, DPM;  Location: [facility];  Service: Podiatry;  Laterality: Right;  . METATARSAL OSTEOTOMY Right 04/19/2017   Procedure: 2ND METATARSAL OSTEOTOMY;  Surgeon: Rosemary Holms, DPM;  Location: [place];  Service: Podiatry;  Laterality: Right;  . TONSILLECTOMY AND ADENOIDECTOMY  age 41  . TUBAL LIGATION Bilateral 1985    Allergies  No Known Allergies  History of Present Illness    Ms. Karam was last seen by Jory Sims DNP  on 01/19/2019. During that time she was experiencing LEE and weight gain. She also complained of DOE in the setting of weight gain and deconditioning due to back pain and being sedentary.   Her PMH includes CAD with PCI to her first diagonal in 2003, a cardiac cath in 2004 showing no significant CAD, Hyperlipidemia, hypertension, OSA, and Type II diabetes.  Echocardiogram on January 2020 showed an LVEF of 55 to 60% with small pericardial effusion, no significant valvular disease.  Myocardial perfusion study obtained at the same time showed low risk study with normal perfusion.  She presents the clinic today and states she has  been seeing a chiropractor that has helped her back pain.  She is more active now and she is also starting a new diet that is high in fruits vegetables and low-fat proteins.  She states that her ankle edema is better but she still notices it from time to time and she has continued to wear her support stockings.  She states she continues to have dyspnea with moderate to high intensity activities however she does not have shortness of breath at rest.  She presents today with a poison ivy rash on all of her extremities trunk and face.  She has been treating this with calamine lotion.  She denies chest pain, increased shortness of breath, lower extremity edema, fatigue, palpitations, melena, hematuria, hemoptysis, diaphoresis, weakness, presyncope, syncope, orthopnea, and PND.  Home Medications    Prior to Admission medications   Medication Sig Start Date End Date Taking? Authorizing Provider  amLODipine (NORVASC) 10 MG tablet TAKE 1 TABLET(10 MG) BY MOUTH DAILY 12/11/18   Elby Beck, FNP  aspirin 81 MG tablet Take 81 mg by mouth daily.      [provider]  Blood Glucose Monitoring Suppl (ONE TOUCH ULTRA 2) w/Device KIT Use to check blood sugar 3 times a day. 11/26/18   Elby Beck, FNP  buPROPion (WELLBUTRIN XL) 150 MG 24 hr tablet TAKE 3 TABLETS(450 MG) BY MOUTH EVERY MORNING 01/19/19   Cottle, Billey Co., MD  Calcium Carbonate-Vitamin D (CALTRATE 600+D) 600-400 MG-UNIT per tablet Take 2 tablets 2 (two) times daily by mouth.     [provider]  Cholecalciferol (VITAMIN D3) 3000 units TABS Take 1 tablet by mouth daily. 06/26/17   Debbrah Alar, NP  glucose blood (ONE TOUCH ULTRA TEST) test strip 1 each by Other route 3 (three) times daily. for testing 11/26/18   Elby Beck, FNP  hydrochlorothiazide (MICROZIDE) 12.5 MG capsule Take 1 capsule (12.5 mg total) by mouth as needed. For edema 01/19/19 04/19/19  Lendon Colonel, NP  Insulin Pen Needle (BD PEN NEEDLE  NANO U/F) 32G X 4 MM MISC USE ONCE DAILY WITH INSULIN INJECTIONS 09/29/18   Debbrah Alar, NP  Lancets [facility] ULTRASOFT) lancets Use to check blood sugar 3 times a day. 11/26/18   Elby Beck, FNP  meloxicam (MOBIC) 7.5 MG tablet Take 1 tablet (7.5 mg total) by mouth daily as needed for pain. 02/25/19   Elby Beck, FNP  metFORMIN (GLUCOPHAGE-XR) 750 MG 24 hr tablet Take 1 tablet (750 mg total) by mouth 2 (two) times daily with a meal. 11/19/18   Elby Beck, FNP  metoprolol succinate (TOPROL-XL) 100 MG 24 hr tablet TAKE 1 TABLET BY MOUTH EVERY DAY. TAKE WITH OR IMMEDIATELY FOLLOWING A MEAL 01/14/19   Elby Beck, FNP  Multiple Vitamins-Minerals (MULTIVITAMIN ADULTS 50+ PO) Take 1 tablet every morning by mouth.  [provider]  nitrofurantoin, macrocrystal-monohydrate, (MACROBID) 100 MG capsule Take 1 capsule (100 mg total) by mouth 2 (two) times daily. 01/28/19   Elby Beck, FNP  olmesartan-hydrochlorothiazide (BENICAR HCT) 40-12.5 MG tablet TAKE 1 TABLET BY MOUTH DAILY 08/28/18   Lelon Perla, MD  pantoprazole (PROTONIX) 40 MG tablet TAKE 1 TABLET(40 MG) BY MOUTH EVERY MORNING 01/15/19   Elby Beck, FNP  Probiotic Product (PROBIOTIC DAILY PO) Take 1 capsule every morning by mouth.     [provider]  rosuvastatin (CRESTOR) 20 MG tablet Take 1 tablet (20 mg total) by mouth daily. 10/01/18   Elby Beck, FNP    Family History    Family History  Problem Relation Age of Onset  . Cancer Mother        throat, cervical  . Diabetes Mother   . Stroke Mother   . Heart attack Mother   . Heart disease Mother   . Hypertension Mother   . Thyroid disease Mother   . Alcoholism Mother   . Cancer Father        prostate  . Diabetes Sister   . Heart attack Sister   . Hypertension Sister   . Hyperlipidemia Sister   . Colon cancer Neg Hx   . Breast cancer Neg Hx    She indicated that her mother is deceased. She indicated that  her father is deceased. She indicated that her sister is deceased. She indicated that her maternal grandmother is deceased. She indicated that her maternal grandfather is deceased. She indicated that her paternal grandmother is deceased. She indicated that her paternal grandfather is deceased. She indicated that the status of her neg hx is unknown.  Social History    Social History   Socioeconomic History  . Marital status: Married    Spouse name: Not on file  . Number of children: 3  . Years of education: Not on file  . Highest education level: Not on file  Occupational History  . Occupation: Air cabin crew    Comment: credit Engineer, drilling: [employer]  Social Needs  . Financial resource strain: Not on file  . Food insecurity    Worry: Not on file    Inability: Not on file  . Transportation needs    Medical: Not on file    Non-medical: Not on file  Tobacco Use  . Smoking status: Former Smoker    Years: 46.00    Types: Cigarettes    Quit date: 09/23/2012    Years since quitting: 6.4  . Smokeless tobacco: Never Used  Substance and Sexual Activity  . Alcohol use: Yes    Alcohol/week: 0.0 standard drinks    Comment: occasional wine  . Drug use: No  . Sexual activity: Not Currently  Lifestyle  . Physical activity    Days per week: Not on file    Minutes per session: Not on file  . Stress: Not on file  Relationships  . Social Herbalist on phone: Not on file    Gets together: Not on file    Attends religious service: Not on file    Active member of club or organization: Not on file    Attends meetings of clubs or organizations: Not on file    Relationship status: Not on file  . Intimate partner violence    Fear of current or ex partner: Not on file    Emotionally abused: Not on  file    Physically abused: Not on file    Forced sexual activity: Not on file  Other Topics Concern  . Not on file  Social History Narrative    Caffeine Use:  2 cups coffee daily   Regular exercise:  No        Review of Systems    General:  No chills, fever, night sweats or weight changes.  Cardiovascular:  No chest pain, dyspnea on exertion, edema, orthopnea, palpitations, paroxysmal nocturnal dyspnea. Dermatological: No rash, lesions/masses Respiratory: No cough, dyspnea Urologic: No hematuria, dysuria Abdominal:   No nausea, vomiting, diarrhea, bright red blood per rectum, melena, or hematemesis Neurologic:  No visual changes, wkns, changes in mental status. All other systems reviewed and are otherwise negative except as noted above.  Physical Exam    VS:  BP 136/72   Pulse 61   Temp (!) 96.8 F (36 C)   Ht _0  (1.6 m)   Wt 198 lb 6.4 oz (90 kg)   SpO2 98%   BMI 35.14 kg/m  , BMI Body mass index is 35.14 kg/m. GEN: Well nourished, well developed, in no acute distress. HEENT: normal. Neck: Supple, no JVD, carotid bruits, or masses. Cardiac: RRR, no murmurs, rubs, or gallops. No clubbing, cyanosis, edema.  Radials/DP/PT 2+ and equal bilaterally.  Respiratory:  Respirations regular and unlabored, clear to auscultation bilaterally. GI: Soft, nontender, nondistended, BS + x 4. MS: no deformity or atrophy. Skin: warm and dry, no rash. Neuro:  Strength and sensation are intact. Psych: Normal affect.  Accessory Clinical Findings    ECG personally reviewed by me today-none today.  EKG 01/28/2019 Normal sinus rhythm 60 bpm  AAA duplex 01/22/2019 Summary: Abdominal Aorta: No evidence of an abdominal aortic aneurysm was visualized. The largest aortic measurement is 2.3 cm. Patent bilateral common and external iliac arteries without focal stenosis.  Echocardiogram 07/09/2018 IMPRESSIONS    1. The left ventricle appears to be normal in size, have normal wall thickness, with normal systolic function of 11-03%. Echo evidence of impaired relaxation in diastolic filling patterns.  2. Right ventricular systolic  pressure is is mildly elevated.  3. The right ventricle is normal in size, has normal wall thickness and normal systolic function.  4. Normal left atrial size.  5. Normal right atrial size.  6. Small pericardial effusion, as described above.  7. The pericardial effusion is globally pericardial effusion.  8. Mitral valve regurgitation is trivial by color flow Doppler.  9. The mitral valve normal in structure and function. 10. Normal tricuspid valve. 11. Aortic valve normal. 12. The inferior vena cava was normal in size <50% respiratory variablity. 13. No atrial level shunt detected by color flow Doppler.   Nuclear stress test  Nuclear stress EF: 67%.  The left ventricular ejection fraction is hyperdynamic (>65%).  There was no ST segment deviation noted during stress.  No T wave inversion was noted during stress.  Defect 1: There is a small defect of mild severity present in the mid anterior location.  The study is normal.  This is a low risk study.  Assessment & Plan   Essential hypertension-BP today 136/72.  Well-controlled at home Continue Benicar 40-12.5 mg tablet daily Continue metoprolol succinate 100 mg tablet daily Continue amlodipine 1 mg tablet daily Heart healthy low-sodium high-fiber diet Increase physical activity as tolerated  Coronary artery disease- no chest pain today. Continue metoprolol succinate 100 mg tablet daily Aspirin 81 mg tablet daily Continue rosuvastatin 20 mg tablet  daily Heart healthy low-sodium high-fiber diet Increase physical activity as tolerated  Hyperlipidemia-10/06/2018: Cholesterol 100; HDL 46.10; LDL Cholesterol 23; Triglycerides 154.0; VLDL 30.8 Continue rosuvastatin 20 mg tablet daily Heart healthy low-sodium high-fiber diet Increase physical activity as tolerated   Chronic lower extremity edema- no lower extremity edema today Continue hydrochlorothiazide 12.5 mg tablet as needed for edema  Disposition: Follow-up with Dr.  Stanford Breed in 3 months.  Deberah Pelton, NP-C 03/10/2019, 8:43 AM

## 2019-03-10 NOTE — Patient Instructions (Addendum)
Medication Instructions:   Your physician recommends that you continue on your current medications as directed. Please refer to the Current Medication list given to you today.  If you need a refill on your cardiac medications before your next appointment, please call your pharmacy.   Lab work: NONE ordered at this time of appointment   If you have labs (blood work) drawn today and your tests are completely normal, you will receive your results only by: Marland Kitchen MyChart Message (if you have MyChart) OR . A paper copy in the mail If you have any lab test that is abnormal or we need to change your treatment, we will call you to review the results.  Testing/Procedures: NONE ordered at this time of appointment   Follow-Up: At [facility], you and your health needs are our priority.  As part of our continuing mission to provide you with exceptional heart care, we have created designated Provider Care Teams.  These Care Teams include your primary Cardiologist (physician) and Advanced Practice Providers (APPs -  Physician Assistants and Nurse Practitioners) who all work together to provide you with the care you need, when you need it. You will need a follow up appointment in 3-4 months with Kirk Ruths, MD   Any Other Special Instructions Will Be Listed Below (If Applicable).  Please read the information given on [facility] [place]

## 2019-03-12 ENCOUNTER — Encounter: Payer: Self-pay | Admitting: Family Medicine

## 2019-03-12 DIAGNOSIS — M9903 Segmental and somatic dysfunction of lumbar region: Secondary | ICD-10-CM | POA: Diagnosis not present

## 2019-03-12 DIAGNOSIS — M9901 Segmental and somatic dysfunction of cervical region: Secondary | ICD-10-CM | POA: Diagnosis not present

## 2019-03-12 DIAGNOSIS — M542 Cervicalgia: Secondary | ICD-10-CM | POA: Diagnosis not present

## 2019-03-12 DIAGNOSIS — M5136 Other intervertebral disc degeneration, lumbar region: Secondary | ICD-10-CM | POA: Diagnosis not present

## 2019-03-13 ENCOUNTER — Other Ambulatory Visit: Payer: Self-pay | Admitting: Family Medicine

## 2019-03-13 DIAGNOSIS — L237 Allergic contact dermatitis due to plants, except food: Secondary | ICD-10-CM

## 2019-03-13 MED ORDER — PREDNISONE 10 MG PO TABS: ORAL_TABLET | ORAL | 0 refills | Status: DC

## 2019-03-17 DIAGNOSIS — M5136 Other intervertebral disc degeneration, lumbar region: Secondary | ICD-10-CM | POA: Diagnosis not present

## 2019-03-17 DIAGNOSIS — M542 Cervicalgia: Secondary | ICD-10-CM | POA: Diagnosis not present

## 2019-03-17 DIAGNOSIS — M9901 Segmental and somatic dysfunction of cervical region: Secondary | ICD-10-CM | POA: Diagnosis not present

## 2019-03-17 DIAGNOSIS — M9903 Segmental and somatic dysfunction of lumbar region: Secondary | ICD-10-CM | POA: Diagnosis not present

## 2019-03-19 DIAGNOSIS — M9901 Segmental and somatic dysfunction of cervical region: Secondary | ICD-10-CM | POA: Diagnosis not present

## 2019-03-19 DIAGNOSIS — M9903 Segmental and somatic dysfunction of lumbar region: Secondary | ICD-10-CM | POA: Diagnosis not present

## 2019-03-19 DIAGNOSIS — M542 Cervicalgia: Secondary | ICD-10-CM | POA: Diagnosis not present

## 2019-03-19 DIAGNOSIS — M5136 Other intervertebral disc degeneration, lumbar region: Secondary | ICD-10-CM | POA: Diagnosis not present

## 2019-04-22 ENCOUNTER — Encounter: Payer: Self-pay | Admitting: Family Medicine

## 2019-04-22 NOTE — Telephone Encounter (Signed)
Please advise [name] [name], thanks. Last OV was 01/28/19 for leg swelling - acute visit Last CPE was 11/2018 -- pt was supposed to return in September for a check up.   Pt is requesting sugar testing - im guessing A1C or regular labs?

## 2019-05-05 ENCOUNTER — Encounter: Payer: Self-pay | Admitting: Family Medicine

## 2019-05-06 ENCOUNTER — Other Ambulatory Visit: Payer: Self-pay

## 2019-05-06 ENCOUNTER — Ambulatory Visit (INDEPENDENT_AMBULATORY_CARE_PROVIDER_SITE_OTHER): Payer: PPO | Admitting: Family Medicine

## 2019-05-06 ENCOUNTER — Encounter: Payer: Self-pay | Admitting: Family Medicine

## 2019-05-06 VITALS — BP 148/82 | HR 58 | Temp 98.2°F | Ht 63.0 in | Wt 196.4 lb

## 2019-05-06 DIAGNOSIS — E6609 Other obesity due to excess calories: Secondary | ICD-10-CM

## 2019-05-06 DIAGNOSIS — Z6834 Body mass index (BMI) 34.0-34.9, adult: Secondary | ICD-10-CM | POA: Diagnosis not present

## 2019-05-06 DIAGNOSIS — R300 Dysuria: Secondary | ICD-10-CM | POA: Diagnosis not present

## 2019-05-06 DIAGNOSIS — M7989 Other specified soft tissue disorders: Secondary | ICD-10-CM | POA: Diagnosis not present

## 2019-05-06 LAB — POCT URINALYSIS DIPSTICK
Bilirubin, UA: NEGATIVE
Blood, UA: NEGATIVE
Glucose, UA: NEGATIVE
Ketones, UA: NEGATIVE
Leukocytes, UA: NEGATIVE
Nitrite, UA: NEGATIVE
Protein, UA: POSITIVE — AB
Spec Grav, UA: >=1.03 — AB (ref 1.010–1.025)
Urobilinogen, UA: 0.2 E.U./dL
pH, UA: 5.5 (ref 5.0–8.0)

## 2019-05-06 NOTE — Progress Notes (Signed)
Subjective:    Patient ID: [name] [name], female    DOB: [birth year], 68 y.o.   MRN: [id number]  HPI Chief Complaint  Patient presents with  . Urinary Tract Infection    Urinary discomfort, pressure and decreased output. No fever.   This is a 68 yo female who presents with above cc. Unsure when symptoms started. Not like typical UTI.  This is discomfort with urination which is intermittent.  Some lower abdominal pressure.  No flank pain, nausea/vomiting, fever or chills.  Wears a pad for incontinence. Not sure when she leaks, just notices some urine in her underwear.  Uses unscented soap for bathing.  Denies vaginal discharge, itching, burning.  Was seen a couple months ago for similar symptoms with negative culture.  Obesity-has been maintaining her weight but has been under more stress lately because her husband has been very ill.  She would like to lose more weight.  Past Medical History:  Diagnosis Date  . Acquired deformity of right foot   . CAD (coronary artery disease) primary cardioloigst-  dr Stanford Breed   hx NSTEMI 10-24-2001  s/p  cardiac cath w/ PCI and DES x1 to first diagonal  . Chronic constipation   . Depression   . Fatty liver 04/26/2015  . GERD (gastroesophageal reflux disease)   . History of abnormal cervical Pap smear    ACUS 2012  . History of exercise stress test 05-25-2014  dr Stanford Breed   borderline (indeterminate) with non-specific ST changes (mild ST depression in leads II,III, aVF, & V6, did not meet criteria for ischemia) ;  pt had low intolerence, no cp, normal heartrate and bp response, no arrhythmias  . History of goiter    as child s/p  removal ,  per pt benign , neck area  (not thyroid)  . History of non-ST elevation myocardial infarction (NSTEMI) 10/24/2001   s/p  PCI and DES to first diagonal  . Hyperlipidemia   . Hypertension   . OSA on CPAP pulmoloigst-  dr young   per study 11-07-2004  severe osa  . Right foot pain   . S/P drug eluting coronary  stent placement 10/27/2001   x1 to first diagonal   . Seasonal and perennial allergic rhinitis   . Type 2 diabetes mellitus ([place])   . Vitamin D deficiency   . Wears dentures    upper and lower partial denture  . Wears glasses    Past Surgical History:  Procedure Laterality Date  . BUNIONECTOMY Left 2012  . BUNIONECTOMY Right 04/19/2017   Procedure: RIGHT BUNIONECTOMY;  Surgeon: Rosemary Holms, DPM;  Location: [facility];  Service: Podiatry;  Laterality: Right;  . CARDIAC CATHETERIZATION  09-11-2002   dr Lia Foyer   well-preserved LVF; continued patency previouly placed stent in diagonal branch;  mild luminal irregularities   . CARDIOVASCULAR STRESS TEST  05-16-2011   dr Stanford Breed   normal nuclear study w/ no evidence ischemia/  normal LV function and wall motion , ef 77%  . CORONARY ANGIOPLASTY WITH STENT PLACEMENT  10-27-2001   dr Lia Foyer   high-grade stenosis first diagonal post PCI and DES (TAXUS);  mild luminal irregularity throughout LAD, RCA, and very mild CFx;  preserved LVF  . goiter removal  1968   per pt benign tumor removed from neck beside throat  . HALLUX VALGUS LAPIDUS Right 04/19/2017   Procedure: HALLUX VALGUS LAPIDUS FUSION;  Surgeon: Rosemary Holms, DPM;  Location: [place];  Service: Podiatry;  Laterality: Right;  . HAMMER TOE SURGERY Right 04/19/2017   Procedure: HAMMER TOE CORRECTION RIGHT 2ND;  Surgeon: Rosemary Holms, DPM;  Location: [facility];  Service: Podiatry;  Laterality: Right;  . METATARSAL OSTEOTOMY Right 04/19/2017   Procedure: 2ND METATARSAL OSTEOTOMY;  Surgeon: Rosemary Holms, DPM;  Location: [place];  Service: Podiatry;  Laterality: Right;  . TONSILLECTOMY AND ADENOIDECTOMY  age 77  . TUBAL LIGATION Bilateral 1985   Family History  Problem Relation Age of Onset  . Cancer Mother        throat, cervical  . Diabetes Mother   . Stroke Mother   . Heart attack Mother   . Heart disease  Mother   . Hypertension Mother   . Thyroid disease Mother   . Alcoholism Mother   . Cancer Father        prostate  . Diabetes Sister   . Heart attack Sister   . Hypertension Sister   . Hyperlipidemia Sister   . Colon cancer Neg Hx   . Breast cancer Neg Hx    Social History   Tobacco Use  . Smoking status: Former Smoker    Years: 46.00    Types: Cigarettes    Quit date: 09/23/2012    Years since quitting: 6.6  . Smokeless tobacco: Never Used  Substance Use Topics  . Alcohol use: Yes    Alcohol/week: 0.0 standard drinks    Comment: occasional wine  . Drug use: No      Review of Systems Per HPI    Objective:   Physical Exam Vitals signs reviewed.  Constitutional:      Appearance: Normal appearance. She is obese.  HENT:     Head: Normocephalic and atraumatic.  Eyes:     Conjunctiva/sclera: Conjunctivae normal.  Cardiovascular:     Rate and Rhythm: Normal rate.  Pulmonary:     Effort: Pulmonary effort is normal.  Musculoskeletal:     Right lower leg: No edema.     Left lower leg: No edema.  Skin:    General: Skin is warm and dry.  Neurological:     Mental Status: She is alert and oriented to person, place, and time.  Psychiatric:        Mood and Affect: Mood normal.        Behavior: Behavior normal.        Thought Content: Thought content normal.        Judgment: Judgment normal.      BP (!) 148/82 (BP Location: Left Arm, Patient Position: Sitting, Cuff Size: Normal)   Pulse (!) 58   Temp 98.2 F (36.8 C) (Temporal)   Ht 5\' 3"  (1.6 m)   Wt 196 lb 6.4 oz (89.1 kg)   SpO2 96%   BMI 34.79 kg/m  Wt Readings from Last 3 Encounters:  05/06/19 196 lb 6.4 oz (89.1 kg)  03/10/19 198 lb 6.4 oz (90 kg)  01/28/19 198 lb 6.4 oz (90 kg)   Results for orders placed or performed in visit on 05/06/19  Urinalysis Dipstick  Result Value Ref Range   Color, UA yellow    Clarity, UA clear    Glucose, UA Negative Negative   Bilirubin, UA neg    Ketones, UA neg     Spec Grav, UA >=1.030 (A) 1.010 - 1.025   Blood, UA neg    pH, UA 5.5 5.0 - 8.0   Protein, UA Positive (A) Negative   Urobilinogen, UA 0.2  0.2 or 1.0 E.U./dL   Nitrite, UA neg    Leukocytes, UA Negative Negative   Appearance     Odor          Assessment & Plan:  1. Dysuria -Urine very concentrated.  Encourage patient to significantly increase her daily water intake -If no improvement in symptoms in the next 24 hours, she can start antibiotic which she has at home.  I suspect culture will be negative.  Symptoms likely related to increased concentration of urine. - Urinalysis Dipstick - Urine Culture  2. Class 1 obesity due to excess calories without serious comorbidity with body mass index (BMI) of 34.0 to 34.9 in adult -Discussed balanced diet and provided patient with resources  3. Left leg swelling -Improved  This visit occurred during the SARS-CoV-2 public health emergency.  Safety protocols were in place, including screening questions prior to the visit, additional usage of staff PPE, and extensive cleaning of exam room while observing appropriate contact time as indicated for disinfecting solutions.    Clarene Reamer, FNP-BC  [facility]  05/06/2019 3:14 PM

## 2019-05-06 NOTE — Patient Instructions (Signed)
Good to see you today.   Significantly increase your water intake. Urine should be light yellow.   If no improvement in 24 hours, go ahead and start your leftover antibiotic until you hear about culture.   A resource that I like is www.dietdoctor.com/diabetes/diet  Here are some guidelines to help you with meal planning -  Avoid all processed and packaged foods (bread, pasta, crackers, chips, etc) and beverages containing calories.  Avoid added sugars and excessive natural sugars.  Attention to how you feel if you consume artificial sweeteners.  Do they make you more hungry or raise your blood sugar?  With every meal and snack, aim to get 20 g of protein (3 ounces of meat, 4 ounces of fish, 3 eggs, protein powder, 1 cup [place] yogurt, 1 cup cottage cheese, etc.)  Increase fiber in the form of non-starchy vegetables.  These help you feel full with very little carbohydrates and are good for gut health.  Eat 1 serving healthy carb per meal- 1/2 cup brown rice, beans, potato, corn- pay attention to whether or not this significantly raises your blood sugar. If it does, reduce the frequency you consume these.   Eat 2-3 servings of lower sugar fruits daily.  This includes berries, apples, oranges, peaches, pears, one half banana.  Have small amounts of good fats such as avocado, nuts, olive oil, nut butters, olives.  Add a little cheese to your salads to make them tasty.

## 2019-05-08 LAB — URINE CULTURE
MICRO NUMBER:: 1139436
SPECIMEN QUALITY:: ADEQUATE

## 2019-05-19 ENCOUNTER — Encounter: Payer: Self-pay | Admitting: Psychiatry

## 2019-05-19 ENCOUNTER — Other Ambulatory Visit: Payer: Self-pay

## 2019-05-19 ENCOUNTER — Ambulatory Visit (INDEPENDENT_AMBULATORY_CARE_PROVIDER_SITE_OTHER): Payer: PPO | Admitting: Psychiatry

## 2019-05-19 DIAGNOSIS — F423 Hoarding disorder: Secondary | ICD-10-CM

## 2019-05-19 DIAGNOSIS — F3342 Major depressive disorder, recurrent, in full remission: Secondary | ICD-10-CM | POA: Diagnosis not present

## 2019-05-19 NOTE — Progress Notes (Signed)
[name] [name] [id number] [birth year] 68 y.o.  Subjective:   Patient ID:  [name] [name] is a 68 y.o. (DOB 05-Oct-1950) female.  Chief Complaint:  Chief Complaint  Patient presents with  . Follow-up    depression and anxiety  . hoarding    Depression        Associated symptoms include no decreased concentration and no suicidal ideas.  [name] [name] presents to the office today for follow-up of depression and med change.   When seen December. Recommended NAC  And stop naltrexone to see if it was causing GI problems.  Rec counseling for hoarding but she didn't go.  Last seen June 2020.  No meds were changed.  Only taking Wellbutrin XL 450 mg daily from here.  Pretty good except H sick suddenly and then needed care for awhile.  Got back to work last week.  Some depressing times but bettter now.   Otherwise not depressed.  Some therapy with  [facility] about hoarding but it never got followed up on it.  He doesn't respond to phone calls or emails.  Did accomplish some with hoarding bc no anxiety with it but he helped her get some of it done.  Patient reports stable mood and denies depressed or irritable moods.  Patient denies any recent difficulty with anxiety.  Patient denies difficulty with sleep initiation or maintenance. Denies appetite disturbance.  Patient reports that energy has been good.  Motivation to clean is not good. Patient denies any difficulty with concentration.  Patient denies any suicidal ideation.  Retirement date Mar 15, 2018 and used to it now.  All these years she's neglected her house but doesn't do anything about it.  Some hoarding.  Doesn't like to stay at home.  No where to put anything in the house bc the closets are full.  Can't get motivated to do anything about it.  Thinks it's motivation and uncertainty over what to do with things.  Won't let people visit bc she's embarrassed.  Doesn't like the house.  H won't move and won't help get rid of things.  Past  Psychiatric Medication Trials: Wellbutrin XL 4 5050+ Vraylar 1.5, Abilify 10, Xanax She has been on Wellbutrin from this practice since 2004 Naltrexone NAC GI  Review of Systems:  Review of Systems  Musculoskeletal: Positive for arthralgias and back pain.  Neurological: Negative for tremors and weakness.  Psychiatric/Behavioral: Positive for depression. Negative for agitation, behavioral problems, confusion, decreased concentration, dysphoric mood, hallucinations, self-injury, sleep disturbance and suicidal ideas. The patient is not nervous/anxious and is not hyperactive.     Medications: I have reviewed the patient's current medications.  Current Outpatient Medications  Medication Sig Dispense Refill  . amLODipine (NORVASC) 10 MG tablet TAKE 1 TABLET(10 MG) BY MOUTH DAILY 90 tablet 2  . aspirin 81 MG tablet Take 81 mg by mouth daily.      . Blood Glucose Monitoring Suppl (ONE TOUCH ULTRA 2) w/Device KIT Use to check blood sugar 3 times a day. 1 each 0  . buPROPion (WELLBUTRIN XL) 150 MG 24 hr tablet TAKE 3 TABLETS(450 MG) BY MOUTH EVERY MORNING 90 tablet 5  . Calcium Carbonate-Vitamin D (CALTRATE 600+D) 600-400 MG-UNIT per tablet Take 2 tablets 2 (two) times daily by mouth.     . Cholecalciferol (VITAMIN D3) 3000 units TABS Take 1 tablet by mouth daily. 30 tablet   . glucose blood (ONE TOUCH ULTRA TEST) test strip 1 each by Other route 3 (three) times  daily. for testing 100 each 12  . Lancets (ONETOUCH ULTRASOFT) lancets Use to check blood sugar 3 times a day. 100 each 11  . meloxicam (MOBIC) 7.5 MG tablet Take 1 tablet (7.5 mg total) by mouth daily as needed for pain. 90 tablet 1  . metFORMIN (GLUCOPHAGE-XR) 750 MG 24 hr tablet Take 1 tablet (750 mg total) by mouth 2 (two) times daily with a meal. 180 tablet 1  . metoprolol succinate (TOPROL-XL) 100 MG 24 hr tablet TAKE 1 TABLET BY MOUTH EVERY DAY. TAKE WITH OR IMMEDIATELY FOLLOWING A MEAL 90 tablet 3  . Multiple Vitamins-Minerals  (MULTIVITAMIN ADULTS 50+ PO) Take 1 tablet every morning by mouth.     . olmesartan-hydrochlorothiazide (BENICAR HCT) 40-12.5 MG tablet TAKE 1 TABLET BY MOUTH DAILY 90 tablet 3  . predniSONE (DELTASONE) 10 MG tablet Take 3 tabs x 3 days, 2 x 3 days, 1 x 3 days. 18 tablet 0  . Probiotic Product (PROBIOTIC DAILY PO) Take 1 capsule every morning by mouth.     . rosuvastatin (CRESTOR) 20 MG tablet Take 1 tablet (20 mg total) by mouth daily. 90 tablet 3  . hydrochlorothiazide (MICROZIDE) 12.5 MG capsule Take 1 capsule (12.5 mg total) by mouth as needed. For edema 30 capsule 3   No current facility-administered medications for this visit.     Medication Side Effects: None  Allergies: No Known Allergies  Past Medical History:  Diagnosis Date  . Acquired deformity of right foot   . CAD (coronary artery disease) primary cardioloigst-  dr Stanford Breed   hx NSTEMI 10-24-2001  s/p  cardiac cath w/ PCI and DES x1 to first diagonal  . Chronic constipation   . Depression   . Fatty liver 04/26/2015  . GERD (gastroesophageal reflux disease)   . History of abnormal cervical Pap smear    ACUS 2012  . History of exercise stress test 05-25-2014  dr Stanford Breed   borderline (indeterminate) with non-specific ST changes (mild ST depression in leads II,III, aVF, & V6, did not meet criteria for ischemia) ;  pt had low intolerence, no cp, normal heartrate and bp response, no arrhythmias  . History of goiter    as child s/p  removal ,  per pt benign , neck area  (not thyroid)  . History of non-ST elevation myocardial infarction (NSTEMI) 10/24/2001   s/p  PCI and DES to first diagonal  . Hyperlipidemia   . Hypertension   . OSA on CPAP pulmoloigst-  dr young   per study 11-07-2004  severe osa  . Right foot pain   . S/P drug eluting coronary stent placement 10/27/2001   x1 to first diagonal   . Seasonal and perennial allergic rhinitis   . Type 2 diabetes mellitus ([place])   . Vitamin D deficiency   . Wears dentures     upper and lower partial denture  . Wears glasses     Family History  Problem Relation Age of Onset  . Cancer Mother        throat, cervical  . Diabetes Mother   . Stroke Mother   . Heart attack Mother   . Heart disease Mother   . Hypertension Mother   . Thyroid disease Mother   . Alcoholism Mother   . Cancer Father        prostate  . Diabetes Sister   . Heart attack Sister   . Hypertension Sister   . Hyperlipidemia Sister   . Colon cancer Neg Hx   .  Breast cancer Neg Hx     Social History   Socioeconomic History  . Marital status: Married    Spouse name: Not on file  . Number of children: 3  . Years of education: Not on file  . Highest education level: Not on file  Occupational History  . Occupation: Air cabin crew    Comment: credit Engineer, drilling: [employer]  Social Needs  . Financial resource strain: Not on file  . Food insecurity    Worry: Not on file    Inability: Not on file  . Transportation needs    Medical: Not on file    Non-medical: Not on file  Tobacco Use  . Smoking status: Former Smoker    Years: 46.00    Types: Cigarettes    Quit date: 09/23/2012    Years since quitting: 6.6  . Smokeless tobacco: Never Used  Substance and Sexual Activity  . Alcohol use: Yes    Alcohol/week: 0.0 standard drinks    Comment: occasional wine  . Drug use: No  . Sexual activity: Not Currently  Lifestyle  . Physical activity    Days per week: Not on file    Minutes per session: Not on file  . Stress: Not on file  Relationships  . Social Herbalist on phone: Not on file    Gets together: Not on file    Attends religious service: Not on file    Active member of club or organization: Not on file    Attends meetings of clubs or organizations: Not on file    Relationship status: Not on file  . Intimate partner violence    Fear of current or ex partner: Not on file    Emotionally abused: Not on file    Physically  abused: Not on file    Forced sexual activity: Not on file  Other Topics Concern  . Not on file  Social History Narrative   Caffeine Use:  2 cups coffee daily   Regular exercise:  No       Past Medical History, Surgical history, Social history, and Family history were reviewed and updated as appropriate.   Please see review of systems for further details on the patient's review from today.   Objective:   Physical Exam:  There were no vitals taken for this visit.  Physical Exam Constitutional:      General: She is not in acute distress.    Appearance: She is well-developed. She is obese.  Musculoskeletal:        General: No deformity.  Neurological:     Mental Status: She is alert and oriented to person, place, and time.     Motor: No tremor.     Coordination: Coordination normal.     Gait: Gait normal.  Psychiatric:        Attention and Perception: She is attentive.        Mood and Affect: Mood is depressed. Mood is not anxious. Affect is not labile, blunt, angry or inappropriate.        Speech: Speech normal.        Behavior: Behavior normal.        Thought Content: Thought content normal. Thought content does not include homicidal or suicidal ideation. Thought content does not include homicidal or suicidal plan.        Judgment: Judgment normal.     Comments: Insight intact. No auditory or visual hallucinations. No  delusions.  Depression is very mild generally. Some hoarding causes distress.     Lab Review:     Component Value Date/Time   NA 140 10/06/2018 1058   NA 143 08/29/2017 0850   K 3.8 10/06/2018 1058   CL 103 10/06/2018 1058   CO2 30 10/06/2018 1058   GLUCOSE 105 (H) 10/06/2018 1058   BUN 17 10/06/2018 1058   BUN 13 08/29/2017 0850   CREATININE 0.79 10/06/2018 1058   CREATININE 0.70 12/15/2014 1949   CALCIUM 9.3 10/06/2018 1058   PROT 6.5 10/06/2018 1058   PROT 6.5 08/29/2017 0850   ALBUMIN 4.1 10/06/2018 1058   ALBUMIN 4.3 08/29/2017 0850    AST 25 10/06/2018 1058   ALT 41 (H) 10/06/2018 1058   ALKPHOS 53 10/06/2018 1058   BILITOT 0.6 10/06/2018 1058   BILITOT 0.5 08/29/2017 0850   GFRNONAA 87 08/29/2017 0850   GFRNONAA 80 10/27/2013 0910   GFRAA 100 08/29/2017 0850   GFRAA >89 10/27/2013 0910       Component Value Date/Time   WBC 6.3 06/09/2018 1545   RBC 4.60 06/09/2018 1545   HGB 13.1 06/09/2018 1545   HGB 13.3 09/05/2016 1202   HCT 38.9 06/09/2018 1545   HCT 42.1 09/05/2016 1202   PLT 192.0 06/09/2018 1545   MCV 84.6 06/09/2018 1545   MCV 89 09/05/2016 1202   MCH 28.0 09/05/2016 1202   MCH 29.2 05/09/2011 0931   MCHC 33.7 06/09/2018 1545   RDW 16.1 (H) 06/09/2018 1545   RDW 15.7 (H) 09/05/2016 1202   LYMPHSABS 2.3 06/09/2018 1545   LYMPHSABS 1.7 09/05/2016 1202   MONOABS 0.4 06/09/2018 1545   EOSABS 0.1 06/09/2018 1545   EOSABS 0.1 09/05/2016 1202   BASOSABS 0.1 06/09/2018 1545   BASOSABS 0.0 09/05/2016 1202    No results found for: POCLITH, LITHIUM   No results found for: PHENYTOIN, PHENOBARB, VALPROATE, CBMZ   .res Assessment: Plan:    Recurrent major depression in full remission ([place])  Hoarding disorder with excessive acquisition   [name] [name]'s depression is very mild and she is satisfied with the medications.  Problems solving around hoarding.  Needs activity to help structure her time.  Then needs strategies to deal with the hoarding, problem solved around that issue.    Option counseling for hoarding and depresssion with retirement. [employer].  15-minute appointment  FU 12 mos.  Lynder Parents, MD, DFAPA  Please see After Visit Summary for patient specific instructions.  Future Appointments  Date Time Provider [place]  06/04/2019 10:00 AM Lelon Perla, MD [facility]    No orders of the defined types were placed in this encounter.     -------------------------------

## 2019-05-21 ENCOUNTER — Other Ambulatory Visit: Payer: Self-pay | Admitting: Family Medicine

## 2019-05-21 ENCOUNTER — Encounter: Payer: Self-pay | Admitting: Family Medicine

## 2019-05-21 MED ORDER — POLYMYXIN B-TRIMETHOPRIM 10000-0.1 UNIT/ML-% OP SOLN: 1.0000 [drp] | Freq: Four times a day (QID) | OPHTHALMIC | 0 refills | Status: DC

## 2019-05-21 NOTE — Telephone Encounter (Signed)
Please update patient.  I sent polytrim eye drops.  Would start that if not better with warm compresses.  If fevers, vision loss, other changes, then needs to call back to triage/seek care.  Thanks .

## 2019-05-21 NOTE — Telephone Encounter (Signed)
Pt notified via [employer].   Nothing further needed.

## 2019-05-21 NOTE — Telephone Encounter (Signed)
Will send to Dr Damita Dunnings to see if any recommendations in [name]'s absence.   Please advise, thanks.  See mychart message/image from patient.

## 2019-05-24 IMAGING — MG DIGITAL SCREENING BILATERAL MAMMOGRAM WITH CAD
4 series · 4 of 4 positions shown · non-contrast
Comparison: Previous exam(s).

CLINICAL DATA: Screening.

EXAM:
DIGITAL SCREENING BILATERAL MAMMOGRAM WITH CAD

[R CC]
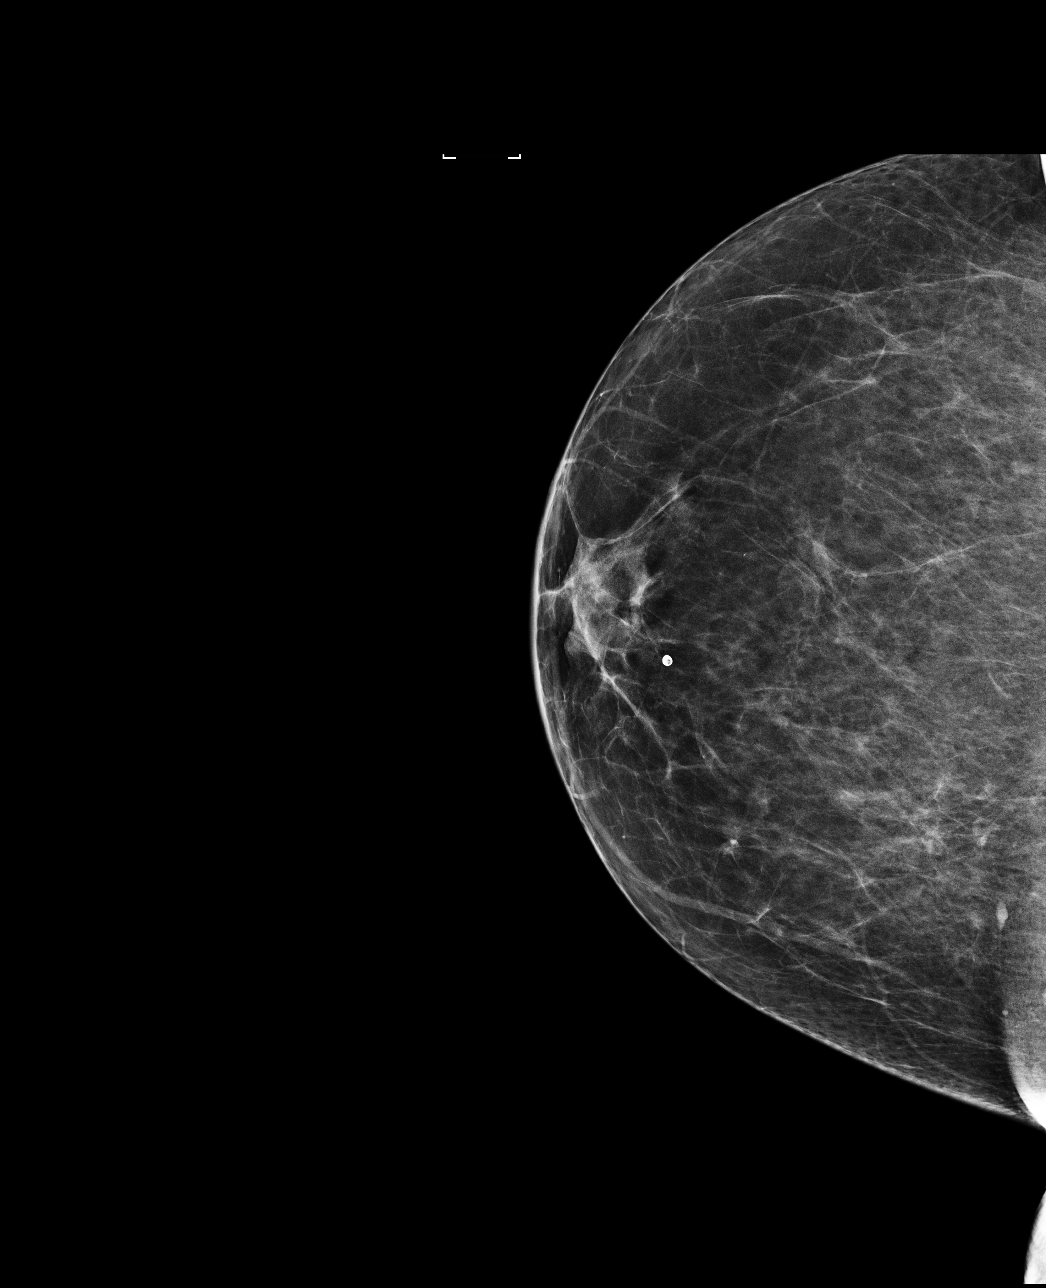

[R MLO]
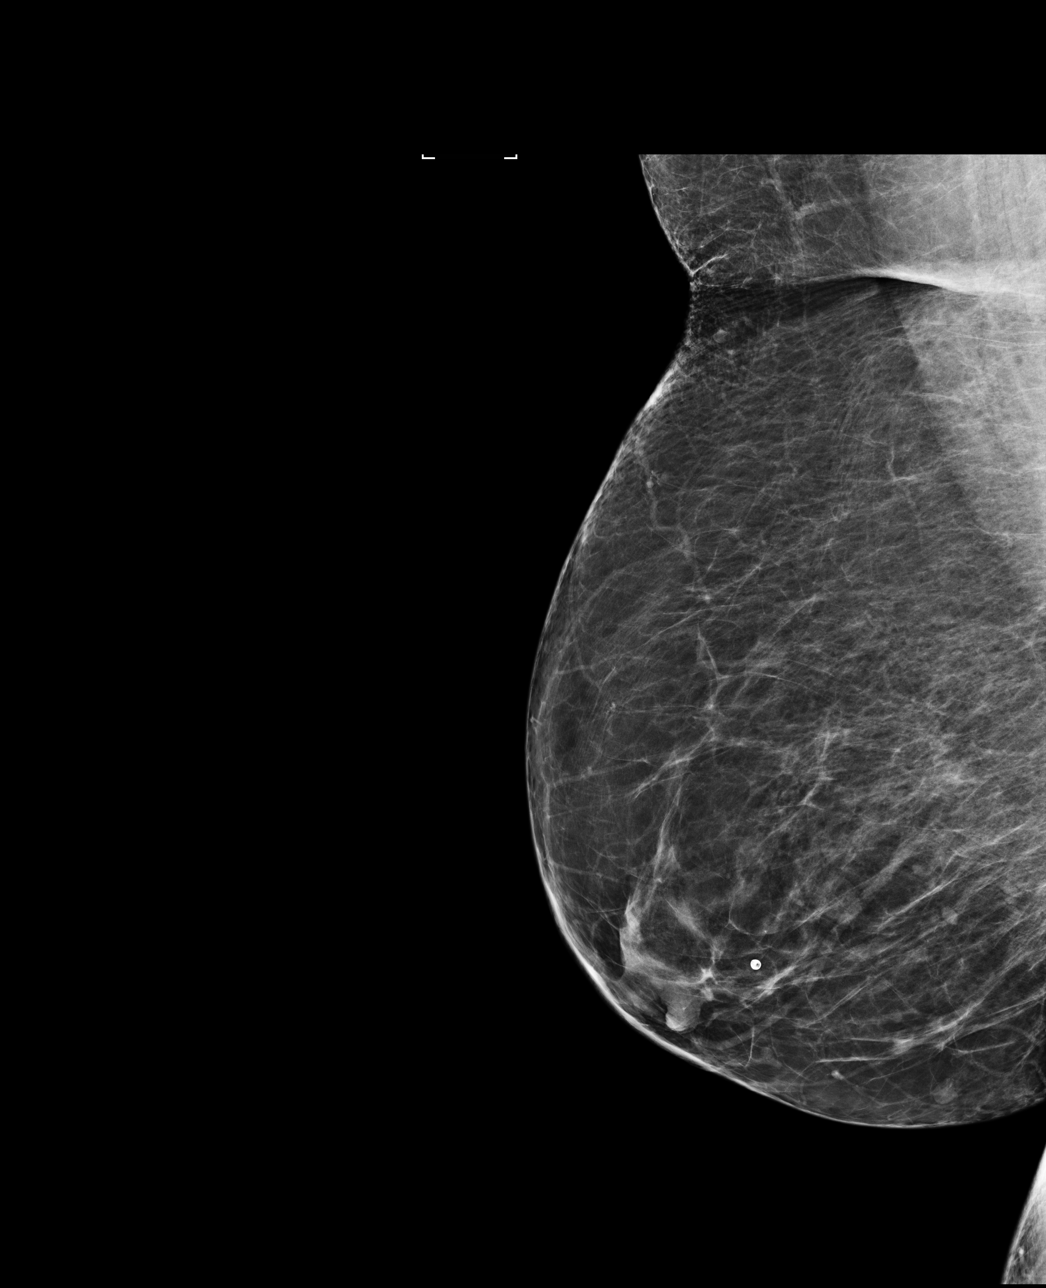

[L MLO]
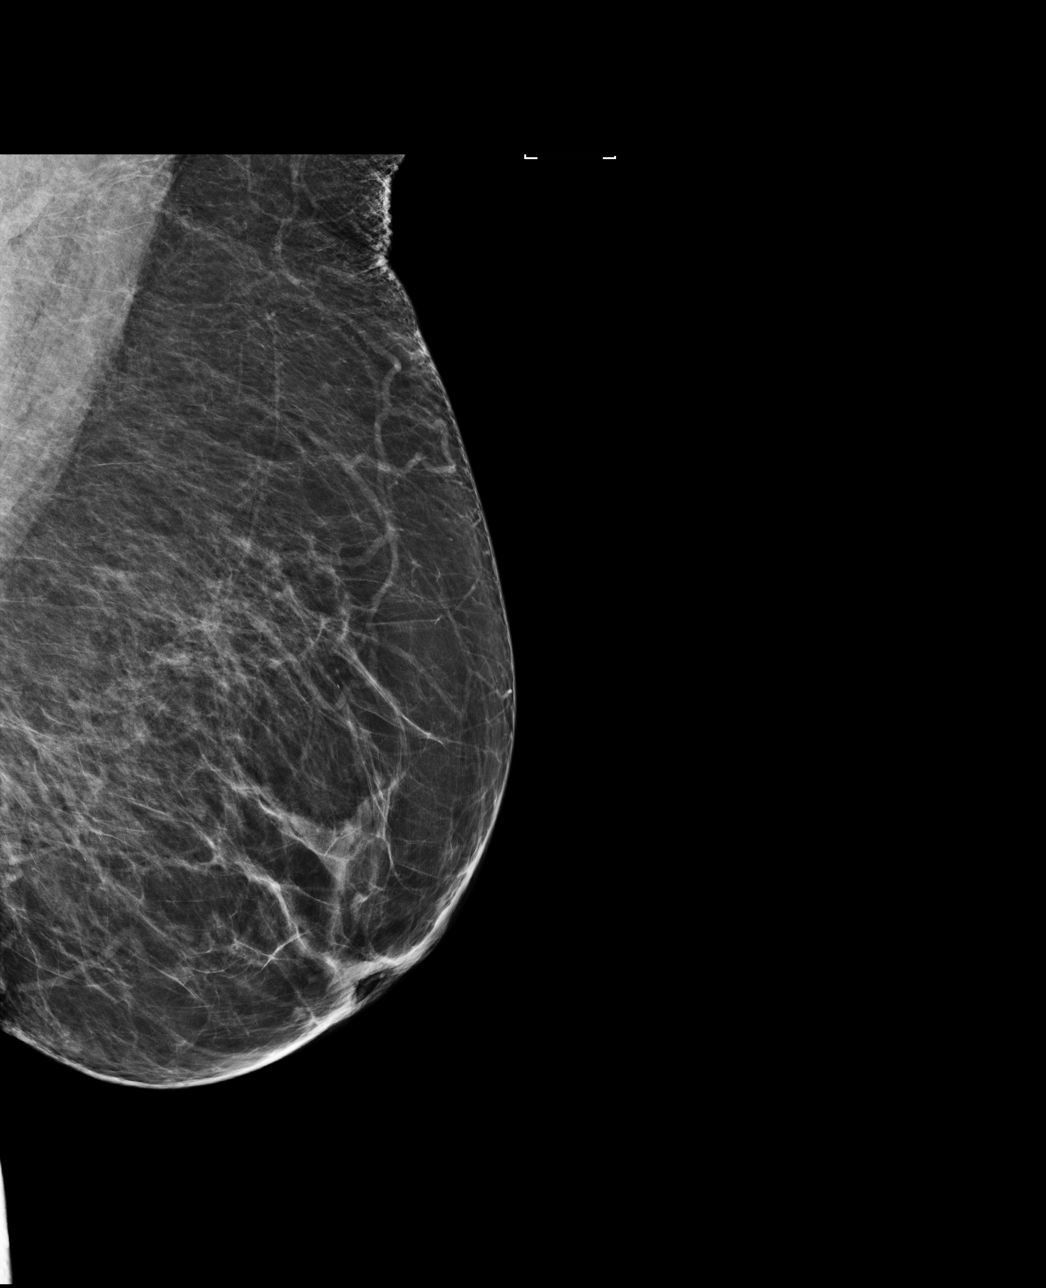

[L CC]
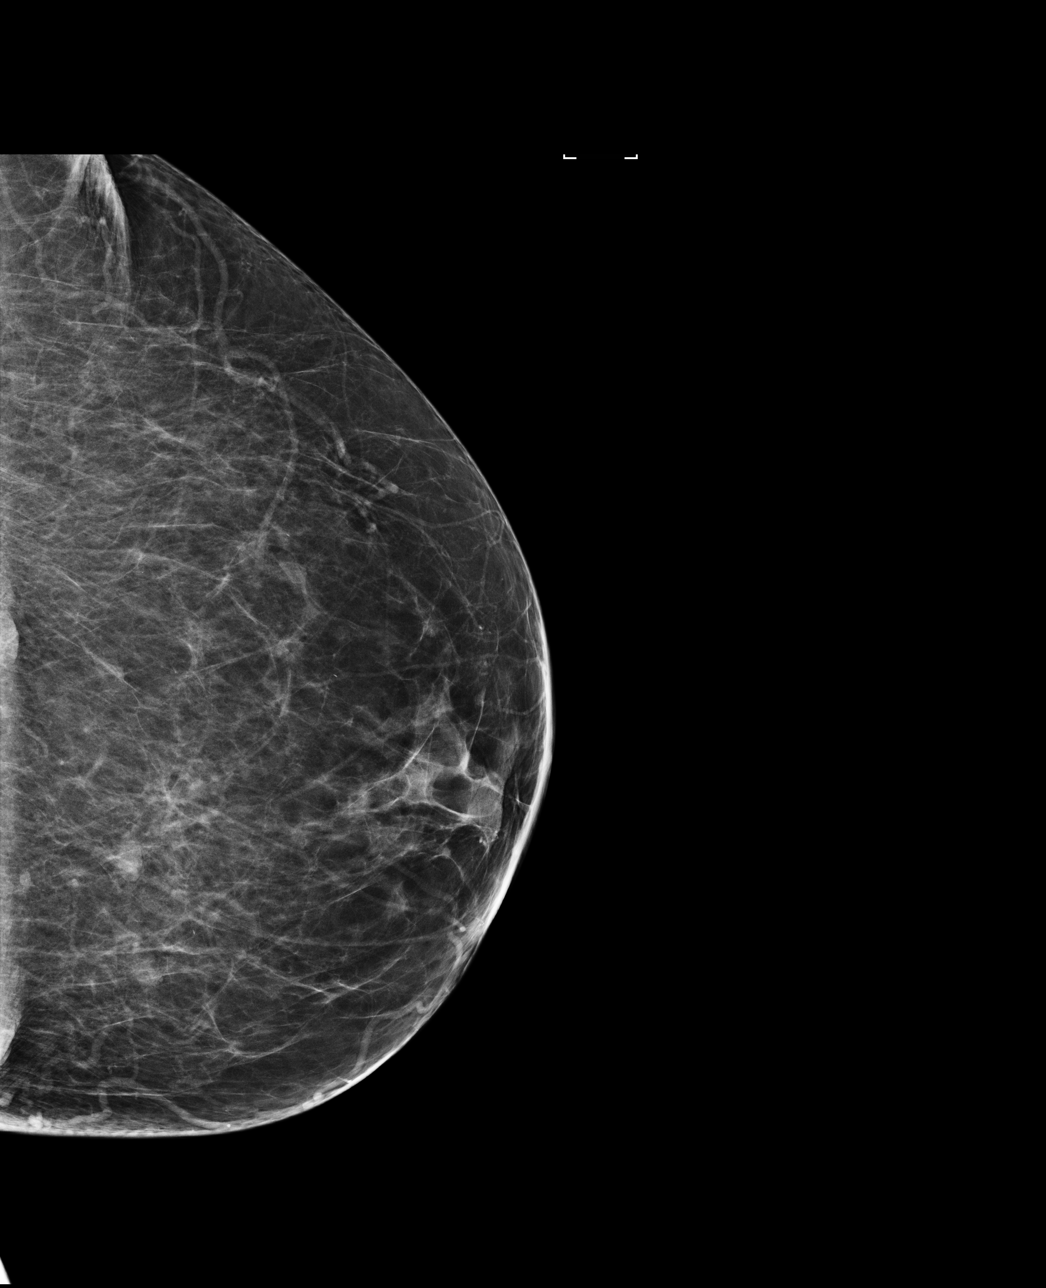

[4 of 4 positions shown; findings below may reference images not displayed]

ACR Breast Density Category b: There are scattered areas of
fibroglandular density.
FINDINGS: There are no findings suspicious for malignancy. Images were
processed with CAD.
IMPRESSION: No mammographic evidence of malignancy. A result letter of this
screening mammogram will be mailed directly to the patient.

RECOMMENDATION:
Screening mammogram in one year. (Code:AS-G-LCT)

BI-RADS CATEGORY  1: Negative.

## 2019-05-26 DIAGNOSIS — M21612 Bunion of left foot: Secondary | ICD-10-CM | POA: Diagnosis not present

## 2019-05-26 DIAGNOSIS — M205X2 Other deformities of toe(s) (acquired), left foot: Secondary | ICD-10-CM | POA: Diagnosis not present

## 2019-05-26 DIAGNOSIS — E139 Other specified diabetes mellitus without complications: Secondary | ICD-10-CM | POA: Diagnosis not present

## 2019-05-26 DIAGNOSIS — M205X1 Other deformities of toe(s) (acquired), right foot: Secondary | ICD-10-CM | POA: Diagnosis not present

## 2019-06-01 NOTE — Progress Notes (Signed)
HPI:  FU coronary disease. She has had a prior PCI of her diagonal. Her most recent catheterization in April of 2004 showed no obstructive disease, and her ejection fraction was normal. Echocardiogram January 2020 showed normal LV function, small pericardial effusion.  Nuclear study January 2020 showed normal perfusion and ejection fraction 67%.  Abdominal ultrasound August 2020 showed no aneurysm.  Since I last saw her,she has dyspnea with more vigorous activities.  No orthopnea or PND.  No chest pain.  She has developed lower extremity edema left greater than right.  Current Outpatient Medications  Medication Sig Dispense Refill  . amLODipine (NORVASC) 10 MG tablet TAKE 1 TABLET(10 MG) BY MOUTH DAILY 90 tablet 2  . aspirin 81 MG tablet Take 81 mg by mouth daily.      . Blood Glucose Monitoring Suppl (ONE TOUCH ULTRA 2) w/Device KIT Use to check blood sugar 3 times a day. 1 each 0  . buPROPion (WELLBUTRIN XL) 150 MG 24 hr tablet TAKE 3 TABLETS(450 MG) BY MOUTH EVERY MORNING 90 tablet 5  . Calcium Carbonate-Vitamin D (CALTRATE 600+D) 600-400 MG-UNIT per tablet Take 2 tablets 2 (two) times daily by mouth.     . Cholecalciferol (VITAMIN D3) 3000 units TABS Take 1 tablet by mouth daily. 30 tablet   . glucose blood (ONE TOUCH ULTRA TEST) test strip 1 each by Other route 3 (three) times daily. for testing 100 each 12  . hydrochlorothiazide (MICROZIDE) 12.5 MG capsule Take 1 capsule (12.5 mg total) by mouth as needed. For edema 30 capsule 3  . Lancets (ONETOUCH ULTRASOFT) lancets Use to check blood sugar 3 times a day. 100 each 11  . meloxicam (MOBIC) 7.5 MG tablet Take 1 tablet (7.5 mg total) by mouth daily as needed for pain. 90 tablet 1  . metFORMIN (GLUCOPHAGE-XR) 750 MG 24 hr tablet Take 1 tablet (750 mg total) by mouth 2 (two) times daily with a meal. 180 tablet 1  . metoprolol succinate (TOPROL-XL) 100 MG 24 hr tablet TAKE 1 TABLET BY MOUTH EVERY DAY. TAKE WITH OR IMMEDIATELY FOLLOWING A  MEAL 90 tablet 3  . Multiple Vitamins-Minerals (MULTIVITAMIN ADULTS 50+ PO) Take 1 tablet every morning by mouth.     . olmesartan-hydrochlorothiazide (BENICAR HCT) 40-12.5 MG tablet TAKE 1 TABLET BY MOUTH DAILY 90 tablet 3  . predniSONE (DELTASONE) 10 MG tablet Take 3 tabs x 3 days, 2 x 3 days, 1 x 3 days. 18 tablet 0  . Probiotic Product (PROBIOTIC DAILY PO) Take 1 capsule every morning by mouth.     . rosuvastatin (CRESTOR) 20 MG tablet Take 1 tablet (20 mg total) by mouth daily. 90 tablet 3  . trimethoprim-polymyxin b (POLYTRIM) ophthalmic solution Place 1 drop into the left eye 4 (four) times daily. 10 mL 0   No current facility-administered medications for this visit.     Past Medical History:  Diagnosis Date  . Acquired deformity of right foot   . CAD (coronary artery disease) primary cardioloigst-  dr Stanford Breed   hx NSTEMI 10-24-2001  s/p  cardiac cath w/ PCI and DES x1 to first diagonal  . Chronic constipation   . Depression   . Fatty liver 04/26/2015  . GERD (gastroesophageal reflux disease)   . History of abnormal cervical Pap smear    ACUS 2012  . History of exercise stress test 05-25-2014  dr Stanford Breed   borderline (indeterminate) with non-specific ST changes (mild ST depression in leads II,III, aVF, &  V6, did not meet criteria for ischemia) ;  pt had low intolerence, no cp, normal heartrate and bp response, no arrhythmias  . History of goiter    as child s/p  removal ,  per pt benign , neck area  (not thyroid)  . History of non-ST elevation myocardial infarction (NSTEMI) 10/24/2001   s/p  PCI and DES to first diagonal  . Hyperlipidemia   . Hypertension   . OSA on CPAP pulmoloigst-  dr young   per study 11-07-2004  severe osa  . Right foot pain   . S/P drug eluting coronary stent placement 10/27/2001   x1 to first diagonal   . Seasonal and perennial allergic rhinitis   . Type 2 diabetes mellitus ([place])   . Vitamin D deficiency   . Wears dentures    upper and lower  partial denture  . Wears glasses     Past Surgical History:  Procedure Laterality Date  . BUNIONECTOMY Left 2012  . BUNIONECTOMY Right 04/19/2017   Procedure: RIGHT BUNIONECTOMY;  Surgeon: Rosemary Holms, DPM;  Location[facility];  Service: Podiatry;  Laterality: Right;  . CARDIAC CATHETERIZATION  09-11-2002   dr Lia Foyer   well-preserved LVF; continued patency previouly placed stent in diagonal branch;  mild luminal irregularities   . CARDIOVASCULAR STRESS TEST  05-16-2011   dr Stanford Breed   normal nuclear study w/ no evidence ischemia/  normal LV function and wall motion , ef 77%  . CORONARY ANGIOPLASTY WITH STENT PLACEMENT  10-27-2001   dr Lia Foyer   high-grade stenosis first diagonal post PCI and DES (TAXUS);  mild luminal irregularity throughout LAD, RCA, and very mild CFx;  preserved LVF  . goiter removal  1968   per pt benign tumor removed from neck beside throat  . HALLUX VALGUS LAPIDUS Right 04/19/2017   Procedure: HALLUX VALGUS LAPIDUS FUSION;  Surgeon: Rosemary Holms, DPM;  Location: [facility];  Service: Podiatry;  Laterality: Right;  . HAMMER TOE SURGERY Right 04/19/2017   Procedure: HAMMER TOE CORRECTION RIGHT 2ND;  Surgeon: Rosemary Holms, DPM;  Location: [facility];  Service: Podiatry;  Laterality: Right;  . METATARSAL OSTEOTOMY Right 04/19/2017   Procedure: 2ND METATARSAL OSTEOTOMY;  Surgeon: Rosemary Holms, DPM;  Location: [facility];  Service: Podiatry;  Laterality: Right;  . TONSILLECTOMY AND ADENOIDECTOMY  age 89  . TUBAL LIGATION Bilateral 1985    Social History   Socioeconomic History  . Marital status: Married    Spouse name: Not on file  . Number of children: 3  . Years of education: Not on file  . Highest education level: Not on file  Occupational History  . 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 Smoking status:  Former Smoker    Years: 46.00    Types: Cigarettes    Quit date: 09/23/2012    Years since quitting: 6.6  . Smokeless tobacco: Never Used  Substance and Sexual Activity  . Alcohol use: Yes    Alcohol/week: 0.0 standard drinks    Comment: occasional wine  . Drug use: No  . Sexual activity: Not Currently  Other Topics Concern  . Not on file  Social History Narrative   Caffeine Use:  2 cups coffee daily   Regular exercise:  No      Social Determinants of Health   Financial Resource Strain:   . Difficulty of  Paying Living Expenses: Not on file  Food Insecurity:   . Worried About Charity fundraiser in the Last Year: Not on file  . Ran Out of Food in the Last Year: Not on file  Transportation Needs:   . Lack of Transportation (Medical): Not on file  . Lack of Transportation (Non-Medical): Not on file  Physical Activity:   . Days of Exercise per Week: Not on file  . Minutes of Exercise per Session: Not on file  Stress:   . Feeling of Stress : Not on file  Social Connections:   . Frequency of Communication with Friends and Family: Not on file  . Frequency of Social Gatherings with Friends and Family: Not on file  . Attends Religious Services: Not on file  . Active Member of Clubs or Organizations: Not on file  . Attends Archivist Meetings: Not on file  . Marital Status: Not on file  Intimate Partner Violence:   . Fear of Current or Ex-Partner: Not on file  . Emotionally Abused: Not on file  . Physically Abused: Not on file  . Sexually Abused: Not on file    Family History  Problem Relation Age of Onset  . Cancer Mother        throat, cervical  . Diabetes Mother   . Stroke Mother   . Heart attack Mother   . Heart disease Mother   . Hypertension Mother   . Thyroid disease Mother   . Alcoholism Mother   . Cancer Father        prostate  . Diabetes Sister   . Heart attack Sister   . Hypertension Sister   . Hyperlipidemia Sister   . Colon cancer Neg Hx     . Breast cancer Neg Hx     ROS: no fevers or chills, productive cough, hemoptysis, dysphasia, odynophagia, melena, hematochezia, dysuria, hematuria, rash, seizure activity, orthopnea, PND, pedal edema, claudication. Remaining systems are negative.  Physical Exam: Well-developed well-nourished in no acute distress.  Skin is warm and dry.  HEENT is normal.  Neck is supple.  Chest is clear to auscultation with normal expansion.  Cardiovascular exam is regular rate and rhythm.  Abdominal exam nontender or distended. No masses palpated. Extremities show 1+ edema. neuro grossly intact  A/P  1 coronary artery disease-patient doing well from a symptomatic standpoint with no chest pain.  Continue aspirin and statin.  2 hyperlipidemia-continue statin.  Check lipids and liver.  3 hypertension-blood pressure controlled.  However she is describing pedal edema.  Amlodipine may be contributing.  I will discontinue.  Add hydralazine 25 mg p.o. 3 times daily and increase if needed.  Check potassium and renal function.  4 lower extremity edema-we are discontinuing amlodipine as outlined above.  Could increase diuretic in the future if needed.  Check BNP.  Kirk Ruths, MD

## 2019-06-04 ENCOUNTER — Encounter: Payer: Self-pay | Admitting: Cardiology

## 2019-06-04 ENCOUNTER — Ambulatory Visit: Payer: PPO | Admitting: Cardiology

## 2019-06-04 ENCOUNTER — Other Ambulatory Visit: Payer: Self-pay

## 2019-06-04 VITALS — BP 136/62 | HR 63 | Temp 97.7°F | Ht 63.0 in | Wt 195.8 lb

## 2019-06-04 DIAGNOSIS — I10 Essential (primary) hypertension: Secondary | ICD-10-CM

## 2019-06-04 DIAGNOSIS — R600 Localized edema: Secondary | ICD-10-CM

## 2019-06-04 DIAGNOSIS — E785 Hyperlipidemia, unspecified: Secondary | ICD-10-CM

## 2019-06-04 MED ORDER — HYDRALAZINE HCL 25 MG PO TABS: 25.0000 mg | ORAL_TABLET | Freq: Three times a day (TID) | ORAL | 3 refills | Status: DC

## 2019-06-04 NOTE — Patient Instructions (Signed)
Medication Instructions:  STOP AMLODIPINE  START HYDRALAZINE 25 MG ONE TABLET THREE TIMES DAILY  *If you need a refill on your cardiac medications before your next appointment, please call your pharmacy*  Lab Work: Your physician recommends that you HAVE LAB WORK TODAY  If you have labs (blood work) drawn today and your tests are completely normal, you will receive your results only by: Marland Kitchen MyChart Message (if you have MyChart) OR . A paper copy in the mail If you have any lab test that is abnormal or we need to change your treatment, we will call you to review the results.  Follow-Up: At [facility], you and your health needs are our priority.  As part of our continuing mission to provide you with exceptional heart care, we have created designated Provider Care Teams.  These Care Teams include your primary Cardiologist (physician) and Advanced Practice Providers (APPs -  Physician Assistants and Nurse Practitioners) who all work together to provide you with the care you need, when you need it.  Your next appointment:   12 month(s)  The format for your next appointment:   Either In Person or Virtual  Provider:   You may see Kirk Ruths, MD or one of the following Advanced Practice Providers on your designated Care Team:    Kerin Ransom, PA-C  [place], [place]  Coletta Memos, [place]

## 2019-06-05 LAB — LIPID PANEL
Chol/HDL Ratio: 2.3 ratio (ref 0.0–4.4)
Cholesterol, Total: 112 mg/dL (ref 100–199)
HDL: 48 mg/dL (ref >39)
LDL Chol Calc (NIH): 41 mg/dL (ref 0–99)
Triglycerides: 133 mg/dL (ref 0–149)
VLDL Cholesterol Cal: 23 mg/dL (ref 5–40)

## 2019-06-05 LAB — COMPREHENSIVE METABOLIC PANEL
ALT: 52 IU/L — ABNORMAL HIGH (ref 0–32)
AST: 32 IU/L (ref 0–40)
Albumin/Globulin Ratio: 1.9 (ref 1.2–2.2)
Albumin: 4.4 g/dL (ref 3.8–4.8)
Alkaline Phosphatase: 69 IU/L (ref 39–117)
BUN/Creatinine Ratio: 21 (ref 12–28)
BUN: 16 mg/dL (ref 8–27)
Bilirubin Total: 0.6 mg/dL (ref 0.0–1.2)
CO2: 23 mmol/L (ref 20–29)
Calcium: 9.9 mg/dL (ref 8.7–10.3)
Chloride: 102 mmol/L (ref 96–106)
Creatinine, Ser: 0.76 mg/dL (ref 0.57–1.00)
GFR calc Af Amer: 93 mL/min/{1.73_m2} (ref >59)
GFR calc non Af Amer: 80 mL/min/{1.73_m2} (ref >59)
Globulin, Total: 2.3 g/dL (ref 1.5–4.5)
Glucose: 133 mg/dL — ABNORMAL HIGH (ref 65–99)
Potassium: 4.2 mmol/L (ref 3.5–5.2)
Sodium: 142 mmol/L (ref 134–144)
Total Protein: 6.7 g/dL (ref 6.0–8.5)

## 2019-06-05 LAB — PRO B NATRIURETIC PEPTIDE: NT-Pro BNP: 47 pg/mL (ref 0–301)

## 2019-06-16 ENCOUNTER — Encounter: Payer: Self-pay | Admitting: Family Medicine

## 2019-06-16 DIAGNOSIS — E669 Obesity, unspecified: Secondary | ICD-10-CM

## 2019-06-16 DIAGNOSIS — E1169 Type 2 diabetes mellitus with other specified complication: Secondary | ICD-10-CM

## 2019-06-16 MED ORDER — METFORMIN HCL ER 750 MG PO TB24: 750.0000 mg | ORAL_TABLET | Freq: Two times a day (BID) | ORAL | 1 refills | Status: DC

## 2019-06-18 ENCOUNTER — Ambulatory Visit: Payer: PPO | Attending: Internal Medicine

## 2019-06-18 ENCOUNTER — Ambulatory Visit (INDEPENDENT_AMBULATORY_CARE_PROVIDER_SITE_OTHER): Payer: PPO | Admitting: Family Medicine

## 2019-06-18 ENCOUNTER — Encounter: Payer: Self-pay | Admitting: Family Medicine

## 2019-06-18 VITALS — BP 194/94 | HR 72 | Ht 63.0 in

## 2019-06-18 DIAGNOSIS — Z20822 Contact with and (suspected) exposure to covid-19: Secondary | ICD-10-CM

## 2019-06-18 NOTE — Assessment & Plan Note (Signed)
Liekly COVID19 vs other viral infection. No clear sign of bacterial infection at this time.  Recommend testing .. info provided through [place].  No red flags or need for ER visit or in person exam.  No SOB.  Pt moderate risk for COVID complications given CAD, DM, obesity and age. If SOB begins.. have low threshold for in person exa, if severe ER visit recommended.  Can monitor Oxygen saturation at home.   Reviewed home care and provided through [place]. Recommended isolation until test returns. If returns positive 10 days quarantine recommended. Provided info about prevention of spread of COVID 19.

## 2019-06-18 NOTE — Progress Notes (Signed)
VIRTUAL VISIT Due to national recommendations of social distancing due to [place] 19, a virtual visit is felt to be most appropriate for this patient at this time.   I connected with the patient on 06/18/19 at 11:40 AM EST by virtual telehealth platform and verified that I am speaking with the correct person using two identifiers.   I discussed the limitations, risks, security and privacy concerns of performing an evaluation and management service by  virtual telehealth platform and the availability of in person appointments. I also discussed with the patient that there may be a patient responsible charge related to this service. The patient expressed understanding and agreed to proceed.  Patient location: Home Provider Location: [facility] Participants: Eliezer Lofts and Tristan Schroeder   Chief Complaint  Patient presents with  . Sore Throat  . Sinus Drainage  . Cough  . Watery Eyes  . Fever    low grade    History of Present Illness: 69 year old female pt of [name] Gessner's with diabetes, HTN, presents with new onset  48 hours of  Post nasal drip, ST, dry cough. This AM ST is better but eye watering, nasal congestion and cough, productive. Temperature 100.38F. Body aches. Ears itching , no pain, facial soreness. Fatigue.  No SOB, no wheeze, no chest pain.   She is risk level 5 for complications from coronavirus COVID 19 screen No recent travel or known exposure to [place].  The importance of social distancing was discussed today.   Review of Systems  Constitutional: Negative for chills and fever.  HENT: Negative for congestion and ear pain.   Eyes: Negative for pain and redness.  Respiratory: Negative for cough and shortness of breath.   Cardiovascular: Negative for chest pain, palpitations and leg swelling.  Gastrointestinal: Negative for abdominal pain, blood in stool, constipation, diarrhea, nausea and vomiting.  Genitourinary: Negative for dysuria.   Musculoskeletal: Negative for falls and myalgias.  Skin: Negative for rash.  Neurological: Negative for dizziness.  Psychiatric/Behavioral: Negative for depression. The patient is not nervous/anxious.       Past Medical History:  Diagnosis Date  . Acquired deformity of right foot   . CAD (coronary artery disease) primary cardioloigst-  dr Stanford Breed   hx NSTEMI 10-24-2001  s/p  cardiac cath w/ PCI and DES x1 to first diagonal  . Chronic constipation   . Depression   . Fatty liver 04/26/2015  . GERD (gastroesophageal reflux disease)   . History of abnormal cervical Pap smear    ACUS 2012  . History of exercise stress test 05-25-2014  dr Stanford Breed   borderline (indeterminate) with non-specific ST changes (mild ST depression in leads II,III, aVF, & V6, did not meet criteria for ischemia) ;  pt had low intolerence, no cp, normal heartrate and bp response, no arrhythmias  . History of goiter    as child s/p  removal ,  per pt benign , neck area  (not thyroid)  . History of non-ST elevation myocardial infarction (NSTEMI) 10/24/2001   s/p  PCI and DES to first diagonal  . Hyperlipidemia   . Hypertension   . OSA on CPAP pulmoloigst-  dr young   per study 11-07-2004  severe osa  . Right foot pain   . S/P drug eluting coronary stent placement 10/27/2001   x1 to first diagonal   . Seasonal and perennial allergic rhinitis   . Type 2 diabetes mellitus ([place])   . Vitamin D deficiency   .  Wears dentures    upper and lower partial denture  . Wears glasses     reports that she quit smoking about 6 years ago. Her smoking use included cigarettes. She quit after 46.00 years of use. She has never used smokeless tobacco. She reports current alcohol use. She reports that she does not use drugs.   Current Outpatient Medications:  .  aspirin 81 MG tablet, Take 81 mg by mouth daily.  , Disp: , Rfl:  .  Blood Glucose Monitoring Suppl (ONE TOUCH ULTRA 2) w/Device KIT, Use to check blood sugar 3 times a  day., Disp: 1 each, Rfl: 0 .  buPROPion (WELLBUTRIN XL) 150 MG 24 hr tablet, TAKE 3 TABLETS(450 MG) BY MOUTH EVERY MORNING, Disp: 90 tablet, Rfl: 5 .  Calcium Carbonate-Vitamin D (CALTRATE 600+D) 600-400 MG-UNIT per tablet, Take 2 tablets 2 (two) times daily by mouth. , Disp: , Rfl:  .  Cholecalciferol (VITAMIN D3) 3000 units TABS, Take 1 tablet by mouth daily., Disp: 30 tablet, Rfl:  .  glucose blood (ONE TOUCH ULTRA TEST) test strip, 1 each by Other route 3 (three) times daily. for testing, Disp: 100 each, Rfl: 12 .  hydrALAZINE (APRESOLINE) 25 MG tablet, Take 1 tablet (25 mg total) by mouth 3 (three) times daily., Disp: 270 tablet, Rfl: 3 .  hydrochlorothiazide (MICROZIDE) 12.5 MG capsule, Take 1 capsule (12.5 mg total) by mouth as needed. For edema, Disp: 30 capsule, Rfl: 3 .  Lancets (ONETOUCH ULTRASOFT) lancets, Use to check blood sugar 3 times a day., Disp: 100 each, Rfl: 11 .  meloxicam (MOBIC) 7.5 MG tablet, Take 1 tablet (7.5 mg total) by mouth daily as needed for pain., Disp: 90 tablet, Rfl: 1 .  metFORMIN (GLUCOPHAGE-XR) 750 MG 24 hr tablet, Take 1 tablet (750 mg total) by mouth 2 (two) times daily with a meal., Disp: 180 tablet, Rfl: 1 .  metoprolol succinate (TOPROL-XL) 100 MG 24 hr tablet, TAKE 1 TABLET BY MOUTH EVERY DAY. TAKE WITH OR IMMEDIATELY FOLLOWING A MEAL, Disp: 90 tablet, Rfl: 3 .  Multiple Vitamins-Minerals (MULTIVITAMIN ADULTS 50+ PO), Take 1 tablet every morning by mouth. , Disp: , Rfl:  .  olmesartan-hydrochlorothiazide (BENICAR HCT) 40-12.5 MG tablet, TAKE 1 TABLET BY MOUTH DAILY, Disp: 90 tablet, Rfl: 3 .  Probiotic Product (PROBIOTIC DAILY PO), Take 1 capsule every morning by mouth. , Disp: , Rfl:  .  rosuvastatin (CRESTOR) 20 MG tablet, Take 1 tablet (20 mg total) by mouth daily., Disp: 90 tablet, Rfl: 3   Observations/Objective: Blood pressure (!) 194/94, pulse 72, height '5\' 3"'  (1.6 m).  Physical Exam  Physical Exam Constitutional:      General: The patient is  not in acute distress. Speaking in complete sentences, relaxed manner. Pulmonary:     Effort: Pulmonary effort is normal. No respiratory distress.  Neurological:     Mental Status: The patient is alert and oriented to person, place, and time.  Psychiatric:        Mood and Affect: Mood normal.        Behavior: Behavior normal.   Assessment and Plan Suspected COVID-19 virus infection   Liekly COVID19 vs other viral infection. No clear sign of bacterial infection at this time.  Recommend testing .. info provided through [facility].  No red flags or need for ER visit or in person exam.  No SOB.  Pt moderate risk for COVID complications given CAD, DM, obesity and age. If SOB begins.. have low threshold for in  person exa, if severe ER visit recommended.  Can monitor Oxygen saturation at home.   Reviewed home care and provided through [facility]. Recommended isolation until test returns. If returns positive 10 days quarantine recommended. Provided info about prevention of spread of COVID 19.     I discussed the assessment and treatment plan with the patient. The patient was provided an opportunity to ask questions and all were answered. The patient agreed with the plan and demonstrated an understanding of the instructions.   The patient was advised to call back or seek an in-person evaluation if the symptoms worsen or if the condition fails to improve as anticipated.     Eliezer Lofts, MD

## 2019-06-18 NOTE — Patient Instructions (Signed)
Start mucinex plain or DM.  Push fluids, rest.   How to make an appointment for COVID testing  TEXT: COVID to [phone]  or CALL: [phone] or  ONLINE: [email or web address]  Locations:  [place]: [facility], hospital [place]: [place] at [employer]  M-F  8 AM to 3:30 PM  How to care for yourself at home:   1) Drink plenty of fluids 2) Get lots of rest 3) Wash your hands regularly - for 20 seconds 4) Cover your mouth when you cough -- ideally cough into your elbow 5) Take tylenol - can do up to 1000 mg every 8 hours (or do smaller doses more frequently) - Avoid Ibuprofen if able 6) Stay home - avoid contact with other people. Ideally have someone bring your groceries, etc.  7) Avoid touching your eyes, nose, mouth 8) Over the counter cold medication may be helpful  Positive Coronovirus - When isolation ends 1) 10 days have passed from when symptoms started  2) Other symptoms have improved - no longer with cough or shortness of breath 3) At least 24 hours since your last fever without needing any tylenol or ibuprofen  Call the clinic immediately or consider going to the emergency room if:  1) Trouble Breathing 2) Persistent pain or pressure in the chest 3) New confusion or inability to wake 4) Bluish lips or face 5) Notify them that you may have COVID-19  Medications:  You may have heard about medications currently being used to treat COVID-19 - including  Dexamethasone, Remdesivir and Hydroxychloroquine/Chloroquine. These are only being used in hospitalized patients because they come with significant risks.   The only proven treatment is symptomatic care - rest, fluids, tylenol.       Person Under Monitoring Name: [name] [name]  Location: [place] [address]   Infection Prevention Recommendations for Individuals Confirmed to have, or Being Evaluated for, 2019 Novel Coronavirus  (COVID-19) Infection Who Receive Care at Home  Individuals who are confirmed to have, or are being evaluated for, COVID-19 should follow the prevention steps below until a healthcare provider or local or state health department says they can return to normal activities.  Stay home except to get medical care You should restrict activities outside your home, except for getting medical care. Do not go to work, school, or public areas, and do not use public transportation or taxis.  Call ahead before visiting your doctor Before your medical appointment, call the healthcare provider and tell them that you have, or are being evaluated for, COVID-19 infection. This will help the healthcare provider's office take steps to keep other people from getting infected. Ask your healthcare provider to call the local or state health department.  Monitor your symptoms Seek prompt medical attention if your illness is worsening (e.g., difficulty breathing). Before going to your medical appointment, call the healthcare provider and tell them that you have, or are being evaluated for, COVID-19 infection. Ask your healthcare provider to call the local or state health department.  Wear a facemask You should wear a facemask that covers your nose and mouth when you are in the same room with other people and when you visit a healthcare provider. People who live with or visit you should also wear a facemask while they are in the same room with you.  Separate yourself from other people in your home As much as possible, you should stay in  a different room from other people in your home. Also, you should use a separate bathroom, if available.  Avoid sharing household items You should not share dishes, drinking glasses, cups, eating utensils, towels, bedding, or other items with other people in your home. After using these items, you should wash them thoroughly with soap and water.  Cover your coughs and  sneezes Cover your mouth and nose with a tissue when you cough or sneeze, or you can cough or sneeze into your sleeve. Throw used tissues in a lined trash can, and immediately wash your hands with soap and water for at least 20 seconds or use an alcohol-based hand rub.  Wash your [facility] your hands often and thoroughly with soap and water for at least 20 seconds. You can use an alcohol-based hand sanitizer if soap and water are not available and if your hands are not visibly dirty. Avoid touching your eyes, nose, and mouth with unwashed hands.   Prevention Steps for Caregivers and Household Members of Individuals Confirmed to have, or Being Evaluated for, COVID-19 Infection Being Cared for in the Home  If you live with, or provide care at home for, a person confirmed to have, or being evaluated for, COVID-19 infection please follow these guidelines to prevent infection:  Follow healthcare provider's instructions Make sure that you understand and can help the patient follow any healthcare provider instructions for all care.  Provide for the patient's basic needs You should help the patient with basic needs in the home and provide support for getting groceries, prescriptions, and other personal needs.  Monitor the patient's symptoms If they are getting sicker, call his or her medical provider and tell them that the patient has, or is being evaluated for, COVID-19 infection. This will help the healthcare provider's office take steps to keep other people from getting infected. Ask the healthcare provider to call the local or state health department.  Limit the number of people who have contact with the patient  If possible, have only one caregiver for the patient.  Other household members should stay in another home or place of residence. If this is not possible, they should stay  in another room, or be separated from the patient as much as possible. Use a separate bathroom, if  available.  Restrict visitors who do not have an essential need to be in the home.  Keep older adults, very young children, and other sick people away from the patient Keep older adults, very young children, and those who have compromised immune systems or chronic health conditions away from the patient. This includes people with chronic heart, lung, or kidney conditions, diabetes, and cancer.  Ensure good ventilation Make sure that shared spaces in the home have good air flow, such as from an air conditioner or an opened window, weather permitting.  Wash your hands often  Wash your hands often and thoroughly with soap and water for at least 20 seconds. You can use an alcohol based hand sanitizer if soap and water are not available and if your hands are not visibly dirty.  Avoid touching your eyes, nose, and mouth with unwashed hands.  Use disposable paper towels to dry your hands. If not available, use dedicated cloth towels and replace them when they become wet.  Wear a facemask and gloves  Wear a disposable facemask at all times in the room and gloves when you touch or have contact with the patient's blood, body fluids, and/or secretions or excretions, such  as sweat, saliva, sputum, nasal mucus, vomit, urine, or feces.  Ensure the mask fits over your nose and mouth tightly, and do not touch it during use.  Throw out disposable facemasks and gloves after using them. Do not reuse.  Wash your hands immediately after removing your facemask and gloves.  If your personal clothing becomes contaminated, carefully remove clothing and launder. Wash your hands after handling contaminated clothing.  Place all used disposable facemasks, gloves, and other waste in a lined container before disposing them with other household waste.  Remove gloves and wash your hands immediately after handling these items.  Do not share dishes, glasses, or other household items with the patient  Avoid sharing  household items. You should not share dishes, drinking glasses, cups, eating utensils, towels, bedding, or other items with a patient who is confirmed to have, or being evaluated for, COVID-19 infection.  After the person uses these items, you should wash them thoroughly with soap and water.  Wash laundry thoroughly  Immediately remove and wash clothes or bedding that have blood, body fluids, and/or secretions or excretions, such as sweat, saliva, sputum, nasal mucus, vomit, urine, or feces, on them.  Wear gloves when handling laundry from the patient.  Read and follow directions on labels of laundry or clothing items and detergent. In general, wash and dry with the warmest temperatures recommended on the label.  Clean all areas the individual has used often  Clean all touchable surfaces, such as counters, tabletops, doorknobs, bathroom fixtures, toilets, phones, keyboards, tablets, and bedside tables, every day. Also, clean any surfaces that may have blood, body fluids, and/or secretions or excretions on them.  Wear gloves when cleaning surfaces the patient has come in contact with.  Use a diluted bleach solution (e.g., dilute bleach with 1 part bleach and 10 parts water) or a household disinfectant with a label that says EPA-registered for coronaviruses. To make a bleach solution at home, add 1 tablespoon of bleach to 1 quart (4 cups) of water. For a larger supply, add  cup of bleach to 1 gallon (16 cups) of water.  Read labels of cleaning products and follow recommendations provided on product labels. Labels contain instructions for safe and effective use of the cleaning product including precautions you should take when applying the product, such as wearing gloves or eye protection and making sure you have good ventilation during use of the product.  Remove gloves and wash hands immediately after cleaning.  Monitor yourself for signs and symptoms of illness Caregivers and household  members are considered close contacts, should monitor their health, and will be asked to limit movement outside of the home to the extent possible. Follow the monitoring steps for close contacts listed on the symptom monitoring form.   ? If you have additional questions, contact your local health department or call the epidemiologist on call at [phone] (available 24/7). ? This guidance is subject to change. For the most up-to-date guidance from [facility], please refer to their website: [email or web address].pl

## 2019-06-20 LAB — NOVEL CORONAVIRUS, NAA: SARS-CoV-2, NAA: NOT DETECTED

## 2019-06-22 ENCOUNTER — Other Ambulatory Visit: Payer: Self-pay | Admitting: Family Medicine

## 2019-06-22 ENCOUNTER — Encounter: Payer: Self-pay | Admitting: Family Medicine

## 2019-06-22 MED ORDER — ALBUTEROL SULFATE HFA 108 (90 BASE) MCG/ACT IN AERS: 2.0000 | INHALATION_SPRAY | RESPIRATORY_TRACT | 0 refills | Status: DC | PRN

## 2019-07-20 MED ORDER — CLONIDINE HCL 0.1 MG PO TABS: 0.1000 mg | ORAL_TABLET | Freq: Two times a day (BID) | ORAL | 11 refills | Status: DC

## 2019-07-20 NOTE — Telephone Encounter (Signed)
DC hydralazine; clonidine 0.1 mg BID  Kirk Ruths

## 2019-08-07 ENCOUNTER — Other Ambulatory Visit: Payer: Self-pay | Admitting: Psychiatry

## 2019-08-13 ENCOUNTER — Encounter: Payer: Self-pay | Admitting: Family Medicine

## 2019-09-09 ENCOUNTER — Other Ambulatory Visit: Payer: Self-pay | Admitting: Adult Health

## 2019-09-17 DIAGNOSIS — G4733 Obstructive sleep apnea (adult) (pediatric): Secondary | ICD-10-CM | POA: Diagnosis not present

## 2019-09-30 MED ORDER — CLONIDINE HCL 0.2 MG PO TABS: 0.2000 mg | ORAL_TABLET | Freq: Two times a day (BID) | ORAL | 3 refills | Status: DC

## 2019-09-30 NOTE — Addendum Note (Signed)
Addended by: Cristopher Estimable on: 09/30/2019 04:31 PM   Modules accepted: Orders

## 2019-09-30 NOTE — Telephone Encounter (Signed)
Increase hydralazine to 50 mg TID; follow BP  Kirk Ruths

## 2019-10-06 MED ORDER — CLONIDINE HCL 0.1 MG PO TABS: 0.1000 mg | ORAL_TABLET | Freq: Two times a day (BID) | ORAL | Status: DC

## 2019-10-06 MED ORDER — CARVEDILOL 12.5 MG PO TABS: 12.5000 mg | ORAL_TABLET | Freq: Two times a day (BID) | ORAL | 3 refills | Status: DC

## 2019-10-06 NOTE — Telephone Encounter (Signed)
Decrease clonidine back to 0.1 mg twice daily. Discontinue Toprol. Begin carvedilol 12.5 mg twice daily. Follow blood pressure.  Kirk Ruths

## 2019-10-11 ENCOUNTER — Other Ambulatory Visit: Payer: Self-pay | Admitting: Family Medicine

## 2019-10-12 ENCOUNTER — Telehealth: Payer: PPO | Admitting: Physician Assistant

## 2019-10-12 DIAGNOSIS — J329 Chronic sinusitis, unspecified: Secondary | ICD-10-CM

## 2019-10-12 MED ORDER — AMOXICILLIN-POT CLAVULANATE 875-125 MG PO TABS: 1.0000 | ORAL_TABLET | Freq: Two times a day (BID) | ORAL | 0 refills | Status: DC

## 2019-10-12 MED ORDER — FLUTICASONE PROPIONATE 50 MCG/ACT NA SUSP: 2.0000 | Freq: Every day | NASAL | 0 refills | Status: DC

## 2019-10-12 NOTE — Progress Notes (Signed)
We are sorry that you are not feeling well.  Here is how we plan to help!  Based on what you have shared with me it looks like you have sinusitis.  Sinusitis is inflammation and infection in the sinus cavities of the head.  Based on your presentation I believe you most likely have Acute Bacterial Sinusitis.  This is an infection caused by bacteria and is treated with antibiotics.   I have prescribed Augmentin 875mg /125mg  one tablet twice daily with food, for 7 days.  I have also prescribed Flonase nasal spray - use this as directed.   You may use an oral decongestant such as Mucinex D or if you have glaucoma or high blood pressure use plain Mucinex. Saline nasal spray help and can safely be used as often as needed for congestion. Stay well hydrated.    If you develop worsening sinus pain, fever or notice severe headache and vision changes, or if symptoms are not better after completion of antibiotic, please schedule an appointment with a health care provider.    Sinus infections are not as easily transmitted as other respiratory infection, however we still recommend that you avoid close contact with loved ones, especially the very young and elderly.  Remember to wash your hands thoroughly throughout the day as this is the number one way to prevent the spread of infection!  Home Care:  Only take medications as instructed by your medical team.  Complete the entire course of an antibiotic.  Do not take these medications with alcohol.  A steam or ultrasonic humidifier can help congestion.  You can place a towel over your head and breathe in the steam from hot water coming from a faucet.  Avoid close contacts especially the very young and the elderly.  Cover your mouth when you cough or sneeze.  Always remember to wash your hands.  Get Help Right Away If:  You develop worsening fever or sinus pain.  You develop a severe head ache or visual changes.  Your symptoms persist after you have  completed your treatment plan.  Make sure you  Understand these instructions.  Will watch your condition.  Will get help right away if you are not doing well or get worse.  Your e-visit answers were reviewed by a board certified advanced clinical practitioner to complete your personal care plan.  Depending on the condition, your plan could have included both over the counter or prescription medications.  If there is a problem please reply  once you have received a response from your provider.  Your safety is important to [place].  If you have drug allergies check your prescription carefully.    You can use MyChart to ask questions about today's visit, request a non-urgent call back, or ask for a work or school excuse for 24 hours related to this e-Visit. If it has been greater than 24 hours you will need to follow up with your provider, or enter a new e-Visit to address those concerns.  You will get an e-mail in the next two days asking about your experience.  I hope that your e-visit has been valuable and will speed your recovery. Thank you for using e-visits.   Greater than 5 minutes, yet less than 10 minutes of time have been spent researching, coordinating and implementing care for this patient today.

## 2019-10-13 NOTE — Telephone Encounter (Signed)
Medication refilled per patient request.   Nothing further needed.

## 2019-10-25 ENCOUNTER — Other Ambulatory Visit: Payer: Self-pay | Admitting: Family Medicine

## 2019-10-26 ENCOUNTER — Other Ambulatory Visit: Payer: Self-pay | Admitting: Cardiology

## 2019-10-26 ENCOUNTER — Telehealth: Payer: Self-pay

## 2019-10-26 DIAGNOSIS — E7800 Pure hypercholesterolemia, unspecified: Secondary | ICD-10-CM

## 2019-10-26 NOTE — Telephone Encounter (Signed)
Follow Up    *STAT* If patient is at the pharmacy, call can be transferred to refill team.   1. Which medications need to be refilled? (please list name of each medication and dose if known)  cloNIDine (CATAPRES) 0.1 MG tablet olmesartan-hydrochlorothiazide (BENICAR HCT) 40-12.5 MG tablet   2. Which pharmacy/location (including street and city if local pharmacy) is medication to be sent to? [facility] [place], [address] AT [facility] OF [address]  3. Do they need a 30 day or 90 day supply? 90 day  Patient is out of both medications. Please refill today.

## 2019-10-26 NOTE — Telephone Encounter (Signed)
*  STAT* If patient is at the pharmacy, call can be transferred to refill team.   1. Which medications need to be refilled? (please list name of each medication and dose if known) olmesartan-hydrochlorothiazide (BENICAR HCT) 40-12.5 MG tablet  2. Which pharmacy/location (including street and city if local pharmacy) is medication to be sent to? [facility] [place], [address] AT [facility] OF [address] [place]  3. Do they need a 30 day or 90 day supply? 90 day  Patient is out of medication

## 2019-10-26 NOTE — Telephone Encounter (Signed)
Pt said has non prod cough with a lot of upper chest congestion and wheezing. And runny nose. Pt traveled to [place] by plane from 10/14/19 - 10/24/19 pt said has had both vaccinations. Pt said that she does feel a lot of congestion on both sides of upper chest. Pt will go to [facility] today for eval and possible xray if needed. FYI to Glenda Chroman FNP.

## 2019-10-26 NOTE — Telephone Encounter (Signed)
Noted  

## 2019-10-27 ENCOUNTER — Other Ambulatory Visit: Payer: Self-pay | Admitting: *Deleted

## 2019-10-27 DIAGNOSIS — J40 Bronchitis, not specified as acute or chronic: Secondary | ICD-10-CM | POA: Diagnosis not present

## 2019-10-27 DIAGNOSIS — Z20822 Contact with and (suspected) exposure to covid-19: Secondary | ICD-10-CM | POA: Diagnosis not present

## 2019-10-27 MED ORDER — ROSUVASTATIN CALCIUM 20 MG PO TABS: 20.0000 mg | ORAL_TABLET | Freq: Every day | ORAL | 3 refills | Status: DC

## 2019-10-27 MED ORDER — CLONIDINE HCL 0.1 MG PO TABS: 0.1000 mg | ORAL_TABLET | Freq: Two times a day (BID) | ORAL | 1 refills | Status: DC

## 2019-10-27 MED ORDER — OLMESARTAN MEDOXOMIL-HCTZ 40-12.5 MG PO TABS: 1.0000 | ORAL_TABLET | Freq: Every day | ORAL | 1 refills | Status: DC

## 2019-10-27 NOTE — Telephone Encounter (Signed)
Message replied on previous encounter.

## 2019-11-27 MED ORDER — HYDRALAZINE HCL 25 MG PO TABS
25.0000 mg | ORAL_TABLET | Freq: Three times a day (TID) | ORAL | 3 refills | Status: DC
Start: 2019-11-27 — End: 2019-12-01

## 2019-11-27 NOTE — Telephone Encounter (Signed)
Add hydralazine 25 mg po tid and follow BP  Kirk Ruths

## 2019-11-30 IMAGING — DX DG ANKLE COMPLETE 3+V*L*
3 series · 3 of 3 positions shown · non-contrast
Comparison: None.

CLINICAL DATA: Swelling

EXAM:
LEFT ANKLE COMPLETE - 3+ VIEW

[ankle ap]
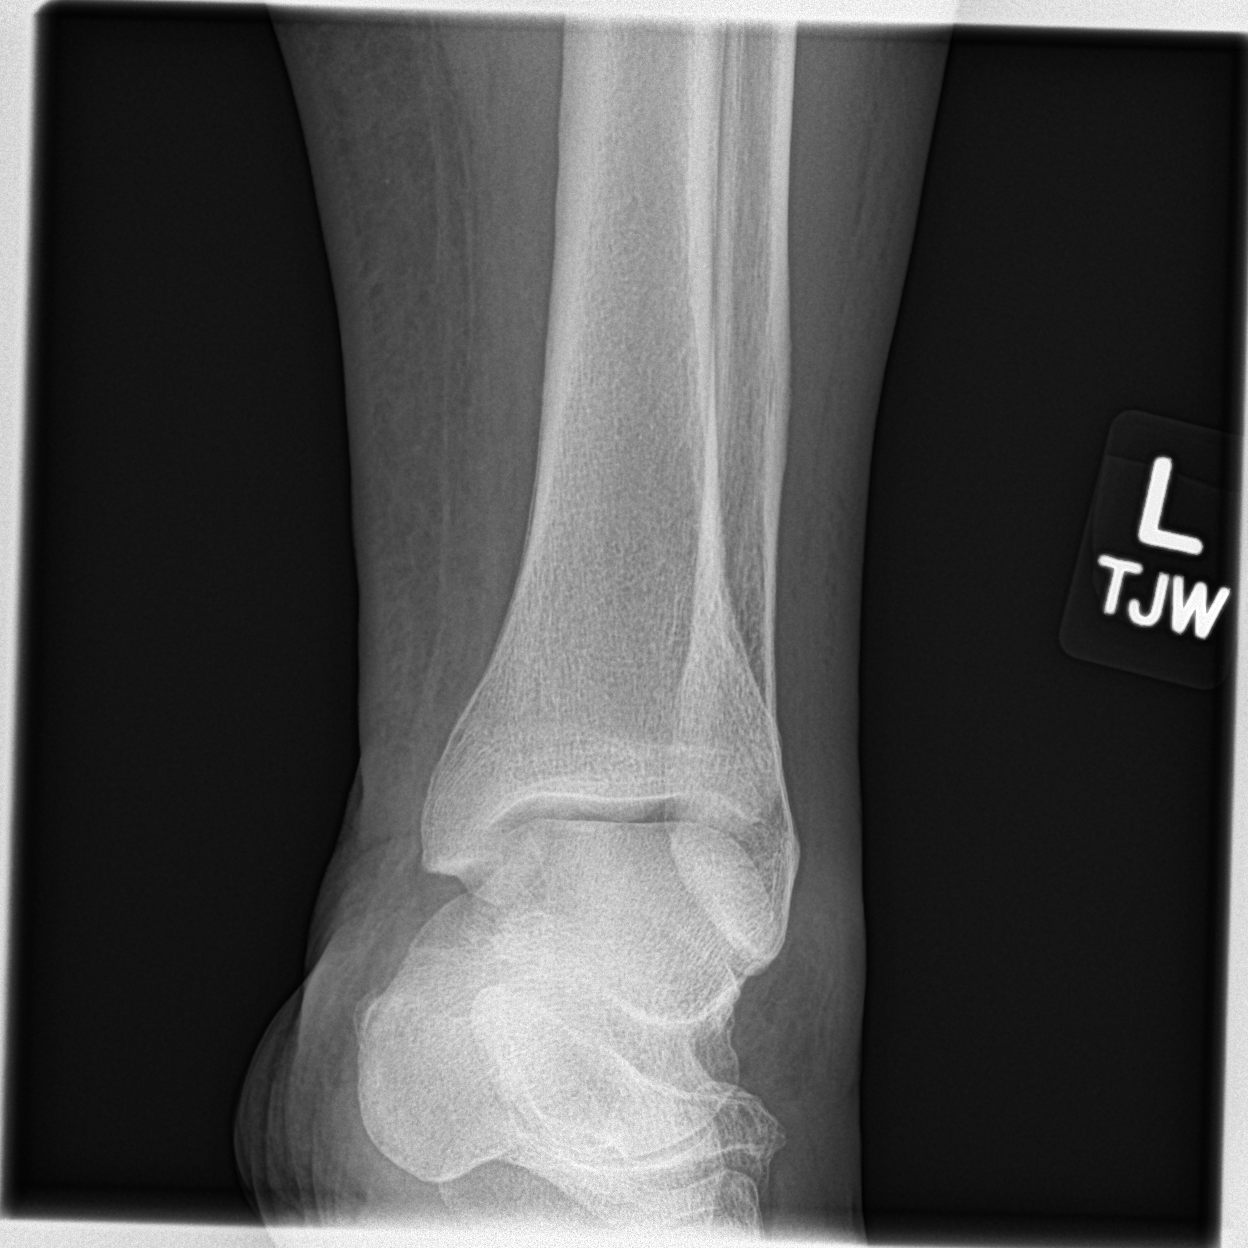

[ankle mortise]
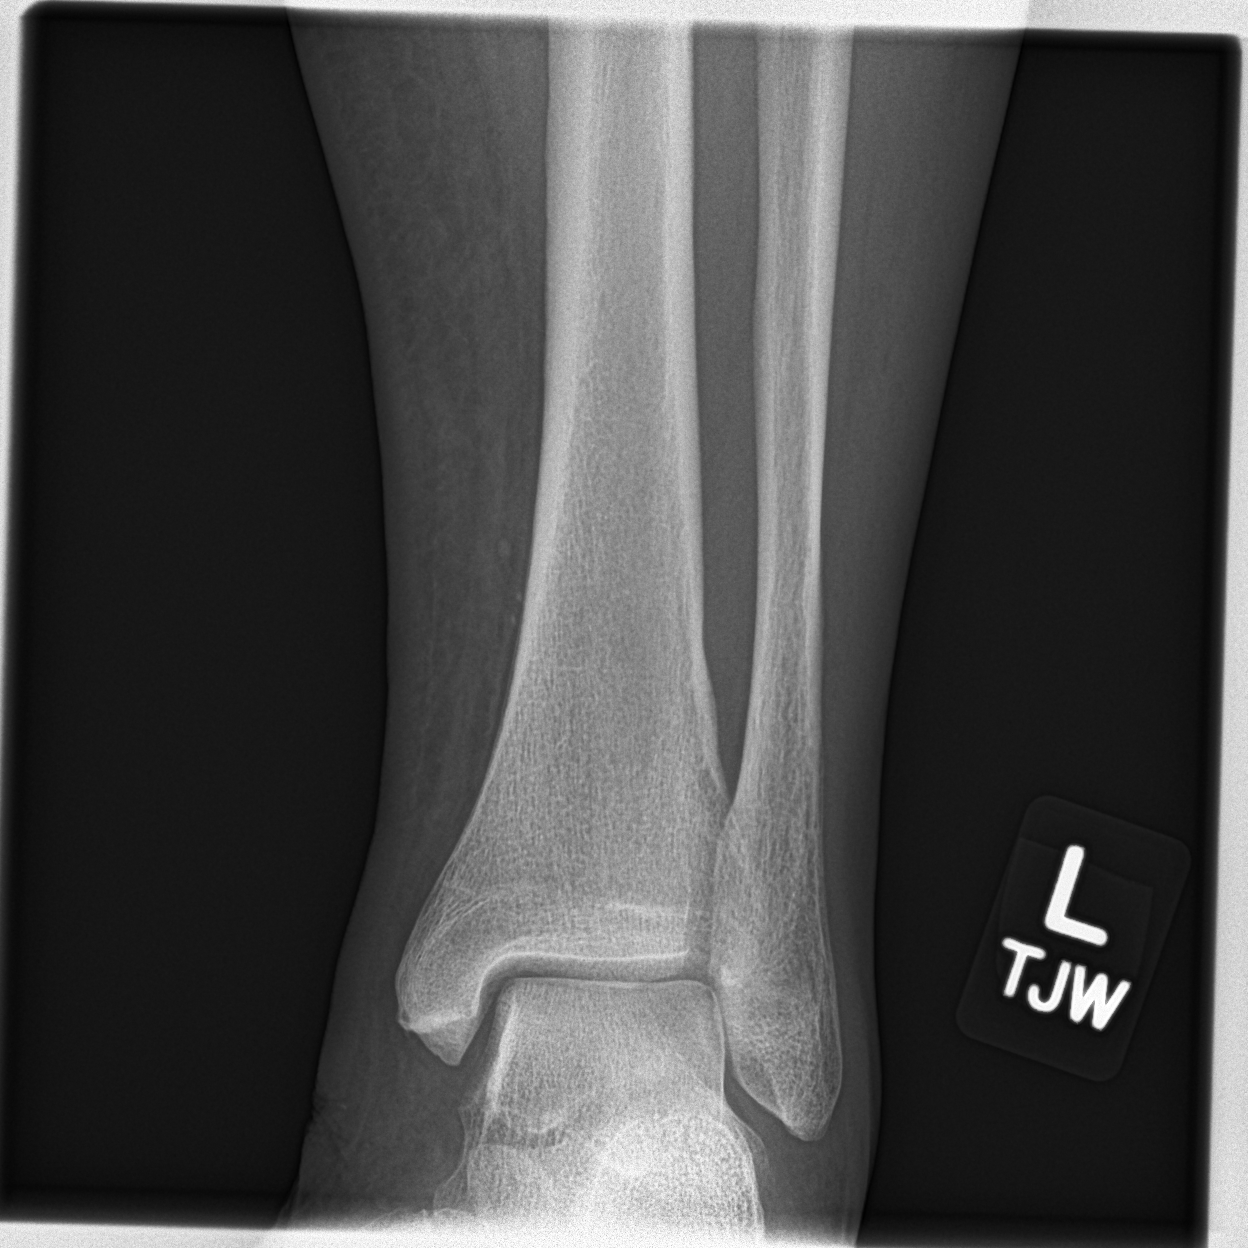

[ankle lat]
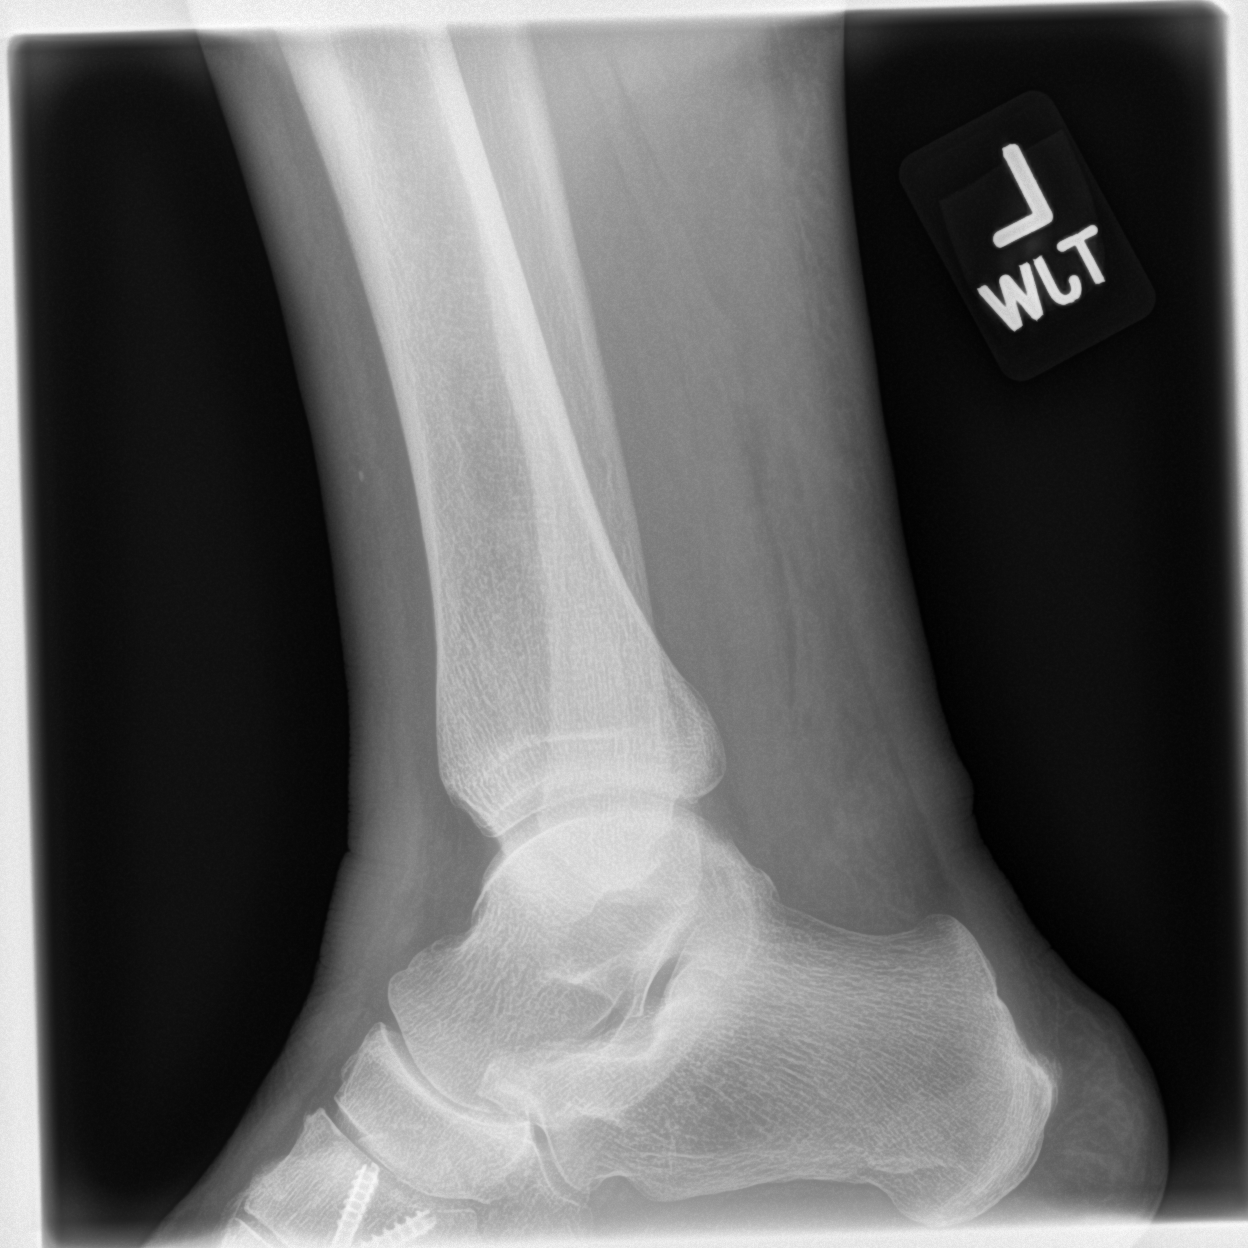

[3 of 3 positions shown; findings below may reference images not displayed]

FINDINGS: Diffuse soft tissue swelling. No fracture or malalignment. Partially
visualized screws within the tarsal bones.
IMPRESSION: Soft tissue swelling.  No acute osseous abnormality

## 2019-12-01 ENCOUNTER — Encounter: Payer: Self-pay | Admitting: Physician Assistant

## 2019-12-01 ENCOUNTER — Other Ambulatory Visit: Payer: Self-pay

## 2019-12-01 ENCOUNTER — Ambulatory Visit: Payer: PPO | Admitting: Physician Assistant

## 2019-12-01 VITALS — BP 154/80 | HR 60 | Ht 63.0 in | Wt 188.8 lb

## 2019-12-01 DIAGNOSIS — E785 Hyperlipidemia, unspecified: Secondary | ICD-10-CM | POA: Diagnosis not present

## 2019-12-01 DIAGNOSIS — I10 Essential (primary) hypertension: Secondary | ICD-10-CM

## 2019-12-01 DIAGNOSIS — E119 Type 2 diabetes mellitus without complications: Secondary | ICD-10-CM

## 2019-12-01 DIAGNOSIS — I2510 Atherosclerotic heart disease of native coronary artery without angina pectoris: Secondary | ICD-10-CM

## 2019-12-01 MED ORDER — CARVEDILOL 12.5 MG PO TABS
18.7500 mg | ORAL_TABLET | Freq: Two times a day (BID) | ORAL | 3 refills | Status: DC
Start: 2019-12-01 — End: 2021-01-16

## 2019-12-01 MED ORDER — HYDRALAZINE HCL 50 MG PO TABS: 50.0000 mg | ORAL_TABLET | Freq: Two times a day (BID) | ORAL | 3 refills | Status: DC

## 2019-12-01 NOTE — Progress Notes (Signed)
Cardiology Office Note:    Date:  12/03/2019   ID:  Emeline, Simpson [birth year], MRN [id number]  PCP:  Elby Beck, FNP  [facility] Cardiologist:  Kirk Ruths, MD  [place] Electrophysiologist:  None   Referring MD: Elby Beck, FNP   No chief complaint on file.   History of Present Illness:    [name] [name] is a 69 y.o. female with a hx of CAD, history of fatty liver disease, HTN, HLD, OSA and DM2. Patient had prior history of PCI to the first diagonal in May 2003 in the setting of NSTEMI.Her most recent cardiac catheterization in April 2004 showed no other significant obstructive disease, normal EF. Myoview in December 2012 was normal. ETT in December 2015 showed borderline changes in the inferior lead. She was seen by her PCP in December 2019 for fatigue. It was suspected patient may had a virus. Her blood pressure was elevated, Toprol-XL was increased to 100 mg daily. Amlodipine was increased to 10 mg daily. TSH was normal. His CBC did not show any sign of anemia.  I last saw the patient on 07/02/2018 for evaluation of increasing shortness of breath.  Her dyspnea mainly occurred with exertion and denies any chest pain.  Echocardiogram obtained on 07/09/2018 showed EF 55 to 60%, small pericardial effusion otherwise no significant valvular issue.  Myoview obtained on the following day on 07/10/2018 was low risk with normal perfusion.  Abdominal ultrasound obtained in August 2020 showed no aneurysm.  Patient was last seen by Dr. Stanford Breed in December 2020, at which time she was doing well.  Blood pressure was normal however she was describing some pedal edema which was felt to be attributed by amlodipine, her amlodipine was discontinued and hydralazine 25 mg 3 times daily was added.  Unfortunately hydralazine was not controlling her blood pressure very well and had difficulty remembering to take another dose during lunch, therefore she was switched to  clonidine.  She also had headache which was felt to be related to hydralazine as well.  To further help her with her blood pressure, previous Toprol-XL was discontinued and she was started on carvedilol 12.5 mg twice daily.  Patient presents today for cardiology office visit.  Her blood pressure still elevated.  Based on her home BP diary, systolic blood pressure has been ranging in the 140s to 160s range.  Heart rate in the high 50s to low 60s range.  She says it is difficult to remember to take the middle dose of the hydralazine.  I recommended increasing carvedilol to 18.75 mg twice a day and increase hydralazine to 50 mg twice daily dosing.  I plan to bring the patient back in a few weeks for reassessment.   Past Medical History:  Diagnosis Date  . Acquired deformity of right foot   . CAD (coronary artery disease) primary cardioloigst-  dr Stanford Breed   hx NSTEMI 10-24-2001  s/p  cardiac cath w/ PCI and DES x1 to first diagonal  . Chronic constipation   . Depression   . Fatty liver 04/26/2015  . GERD (gastroesophageal reflux disease)   . History of abnormal cervical Pap smear    ACUS 2012  . History of exercise stress test 05-25-2014  dr Stanford Breed   borderline (indeterminate) with non-specific ST changes (mild ST depression in leads II,III, aVF, & V6, did not meet criteria for ischemia) ;  pt had low intolerence, no cp, normal heartrate and bp response, no arrhythmias  . History  of goiter    as child s/p  removal ,  per pt benign , neck area  (not thyroid)  . History of non-ST elevation myocardial infarction (NSTEMI) 10/24/2001   s/p  PCI and DES to first diagonal  . Hyperlipidemia   . Hypertension   . OSA on CPAP pulmoloigst-  dr young   per study 11-07-2004  severe osa  . Right foot pain   . S/P drug eluting coronary stent placement 10/27/2001   x1 to first diagonal   . Seasonal and perennial allergic rhinitis   . Type 2 diabetes mellitus ([place])   . Vitamin D deficiency   . Wears  dentures    upper and lower partial denture  . Wears glasses     Past Surgical History:  Procedure Laterality Date  . BUNIONECTOMY Left 2012  . BUNIONECTOMY Right 04/19/2017   Procedure: RIGHT BUNIONECTOMY;  Surgeon: Rosemary Holms, DPM;  Location: [facility];  Service: Podiatry;  Laterality: Right;  . CARDIAC CATHETERIZATION  09-11-2002   dr Lia Foyer   well-preserved LVF; continued patency previouly placed stent in diagonal branch;  mild luminal irregularities   . CARDIOVASCULAR STRESS TEST  05-16-2011   dr Stanford Breed   normal nuclear study w/ no evidence ischemia/  normal LV function and wall motion , ef 77%  . CORONARY ANGIOPLASTY WITH STENT PLACEMENT  10-27-2001   dr Lia Foyer   high-grade stenosis first diagonal post PCI and DES (TAXUS);  mild luminal irregularity throughout LAD, RCA, and very mild CFx;  preserved LVF  . goiter removal  1968   per pt benign tumor removed from neck beside throat  . HALLUX VALGUS LAPIDUS Right 04/19/2017   Procedure: HALLUX VALGUS LAPIDUS FUSION;  Surgeon: Rosemary Holms, DPM;  Location: [place];  Service: Podiatry;  Laterality: Right;  . HAMMER TOE SURGERY Right 04/19/2017   Procedure: HAMMER TOE CORRECTION RIGHT 2ND;  Surgeon: Rosemary Holms, DPM;  Location: [facility];  Service: Podiatry;  Laterality: Right;  . METATARSAL OSTEOTOMY Right 04/19/2017   Procedure: 2ND METATARSAL OSTEOTOMY;  Surgeon: Rosemary Holms, DPM;  Location: [place];  Service: Podiatry;  Laterality: Right;  . TONSILLECTOMY AND ADENOIDECTOMY  age 65  . TUBAL LIGATION Bilateral 1985    Current Medications: Current Meds  Medication Sig  . albuterol (VENTOLIN HFA) 108 (90 Base) MCG/ACT inhaler Inhale 2 puffs into the lungs every 4 (four) hours as needed for wheezing or shortness of breath (cough, shortness of breath or wheezing.).  Marland Kitchen aspirin 81 MG tablet Take 81 mg by mouth daily.    . Blood Glucose Monitoring Suppl  (ONE TOUCH ULTRA 2) w/Device KIT Use to check blood sugar 3 times a day.  Marland Kitchen buPROPion (WELLBUTRIN XL) 150 MG 24 hr tablet TAKE 3 TABLETS(450 MG) BY MOUTH EVERY MORNING  . Calcium Carbonate-Vitamin D (CALTRATE 600+D) 600-400 MG-UNIT per tablet Take 2 tablets 2 (two) times daily by mouth.   . carvedilol (COREG) 12.5 MG tablet Take 1.5 tablets (18.75 mg total) by mouth 2 (two) times daily.  . Cholecalciferol (VITAMIN D3) 3000 units TABS Take 1 tablet by mouth daily.  . cloNIDine (CATAPRES) 0.1 MG tablet Take 1 tablet (0.1 mg total) by mouth 2 (two) times daily.  . fluticasone (FLONASE) 50 MCG/ACT nasal spray Place 2 sprays into both nostrils daily.  Marland Kitchen glucose blood (ONE TOUCH ULTRA TEST) test strip 1 each by Other route 3 (three) times daily. for testing  . hydrALAZINE (APRESOLINE) 50  MG tablet Take 1 tablet (50 mg total) by mouth in the morning and at bedtime.  . hydrochlorothiazide (MICROZIDE) 12.5 MG capsule TAKE 1 CAPSULE BY MOUTH EVERY DAY AS NEEDED FOR EDEMA  . Lancets (ONETOUCH ULTRASOFT) lancets Use to check blood sugar 3 times a day.  . meloxicam (MOBIC) 7.5 MG tablet Take 1 tablet (7.5 mg total) by mouth daily as needed for pain.  . metFORMIN (GLUCOPHAGE-XR) 750 MG 24 hr tablet Take 1 tablet (750 mg total) by mouth 2 (two) times daily with a meal.  . Multiple Vitamins-Minerals (MULTIVITAMIN ADULTS 50+ PO) Take 1 tablet every morning by mouth.   . olmesartan-hydrochlorothiazide (BENICAR HCT) 40-12.5 MG tablet Take 1 tablet by mouth daily.  . pantoprazole (PROTONIX) 40 MG tablet TAKE 1 TABLET(40 MG) BY MOUTH EVERY MORNING  . Probiotic Product (PROBIOTIC DAILY PO) Take 1 capsule every morning by mouth.   . rosuvastatin (CRESTOR) 20 MG tablet Take 1 tablet (20 mg total) by mouth daily.  . [DISCONTINUED] carvedilol (COREG) 12.5 MG tablet Take 1 tablet (12.5 mg total) by mouth 2 (two) times daily.  . [DISCONTINUED] hydrALAZINE (APRESOLINE) 25 MG tablet Take 1 tablet (25 mg total) by mouth 3  (three) times daily.     Allergies:   Patient has no known allergies.   Social History   Socioeconomic History  . Marital status: Married    Spouse name: Not on file  . Number of children: 3  . Years of education: Not on file  . Highest education level: Not on file  Occupational History  . 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 Smoking status: Former Smoker    Years: 46.00    Types: Cigarettes    Quit date: 09/23/2012    Years since quitting: 7.1  . Smokeless tobacco: Never Used  Vaping Use  . Vaping Use: Never used  Substance and Sexual Activity  . Alcohol use: Yes    Alcohol/week: 0.0 standard drinks    Comment: occasional wine  . Drug use: No  . Sexual activity: Not Currently  Other Topics Concern  . Not on file  Social History Narrative   Caffeine Use:  2 cups coffee daily   Regular exercise:  No      Social Determinants of Health   Financial Resource Strain:   . Difficulty of Paying Living Expenses:   Food Insecurity:   . Worried About Charity fundraiser in the Last Year:   . Arboriculturist in the Last Year:   Transportation Needs:   . Film/video editor (Medical):   Marland Kitchen Lack of Transportation (Non-Medical):   Physical Activity:   . Days of Exercise per Week:   . Minutes of Exercise per Session:   Stress:   . Feeling of Stress :   Social Connections:   . Frequency of Communication with Friends and Family:   . Frequency of Social Gatherings with Friends and Family:   . Attends Religious Services:   . Active Member of Clubs or Organizations:   . Attends Archivist Meetings:   Marland Kitchen Marital Status:      Family History: The patient's family history includes Alcoholism in her mother; Cancer in her father and mother; Diabetes in her mother and sister; Heart attack in her mother and sister; Heart disease in her mother; Hyperlipidemia in her sister; Hypertension in  her mother and sister; Stroke  in her mother; Thyroid disease in her mother. There is no history of Colon cancer or Breast cancer.  ROS:   Please see the history of present illness.     All other systems reviewed and are negative.  EKGs/Labs/Other Studies Reviewed:    The following studies were reviewed today:  Echo 07/09/2018 1. The left ventricle appears to be normal in size, have normal wall  thickness, with normal systolic function of 11-94%. Echo evidence of  impaired relaxation in diastolic filling patterns.  2. Right ventricular systolic pressure is is mildly elevated.  3. The right ventricle is normal in size, has normal wall thickness and  normal systolic function.  4. Normal left atrial size.  5. Normal right atrial size.  6. Small pericardial effusion, as described above.  7. The pericardial effusion is globally pericardial effusion.  8. Mitral valve regurgitation is trivial by color flow Doppler.  9. The mitral valve normal in structure and function.  10. Normal tricuspid valve.  11. Aortic valve normal.  12. The inferior vena cava was normal in size <50% respiratory variablity.  13. No atrial level shunt detected by color flow Doppler.    Myoview 07/10/2018  Nuclear stress EF: 67%.  The left ventricular ejection fraction is hyperdynamic (>65%).  There was no ST segment deviation noted during stress.  No T wave inversion was noted during stress.  Defect 1: There is a small defect of mild severity present in the mid anterior location.  The study is normal.  This is a low risk study.   Low risk stress nuclear study with normal perfusion and normal left ventricular regional and global systolic function.   EKG:  EKG is not ordered today.   Recent Labs: 06/04/2019: ALT 52; BUN 16; Creatinine, Ser 0.76; NT-Pro BNP 47; Potassium 4.2; Sodium 142  Recent Lipid Panel    Component Value Date/Time   CHOL 112 06/04/2019 1038   TRIG 133 06/04/2019 1038    HDL 48 06/04/2019 1038   CHOLHDL 2.3 06/04/2019 1038   CHOLHDL 2 10/06/2018 1058   VLDL 30.8 10/06/2018 1058   LDLCALC 41 06/04/2019 1038   LDLDIRECT 77.7 12/27/2009 0942    Physical Exam:    VS:  BP (!) 154/80   Pulse 60   Ht '5\' 3"'  (1.6 m)   Wt 188 lb 12.8 oz (85.6 kg)   SpO2 98%   BMI 33.44 kg/m     Wt Readings from Last 3 Encounters:  12/01/19 188 lb 12.8 oz (85.6 kg)  06/04/19 195 lb 12.8 oz (88.8 kg)  05/06/19 196 lb 6.4 oz (89.1 kg)     GEN:  Well nourished, well developed in no acute distress HEENT: Normal NECK: No JVD; No carotid bruits LYMPHATICS: No lymphadenopathy CARDIAC: RRR, no murmurs, rubs, gallops RESPIRATORY:  Clear to auscultation without rales, wheezing or rhonchi  ABDOMEN: Soft, non-tender, non-distended MUSCULOSKELETAL:  No edema; No deformity  SKIN: Warm and dry NEUROLOGIC:  Alert and oriented x 3 PSYCHIATRIC:  Normal affect   ASSESSMENT:    1. Essential hypertension   2. Coronary artery disease involving native coronary artery of native heart without angina pectoris   3. Hyperlipidemia LDL goal <70   4. Controlled type 2 diabetes mellitus without complication, without long-term current use of insulin (HCC)    PLAN:    In order of problems listed above:  1. Hypertension: Blood pressure has been elevated at home.  Will increase carvedilol to 18.75 mg twice daily and increase hydralazine  to 50 mg twice daily.  Previously she was on 25 mg 3 times daily of hydralazine however has trouble remembering to take the dose in the middle of the day.  2. CAD: Denies any recent chest pain.  Myoview in January 2020 was low risk  3. Hyperlipidemia: Continue statin therapy  4. DM2: Managed by primary care provider.   Medication Adjustments/Labs and Tests Ordered: Current medicines are reviewed at length with the patient today.  Concerns regarding medicines are outlined above.  No orders of the defined types were placed in this encounter.  Meds ordered  this encounter  Medications  . hydrALAZINE (APRESOLINE) 50 MG tablet    Sig: Take 1 tablet (50 mg total) by mouth in the morning and at bedtime.    Dispense:  180 tablet    Refill:  3  . carvedilol (COREG) 12.5 MG tablet    Sig: Take 1.5 tablets (18.75 mg total) by mouth 2 (two) times daily.    Dispense:  270 tablet    Refill:  3    Patient Instructions  Medication Instructions:   INCREASE Carvedilol (Coreg) to 18.75 mg (1.5 tablets) 2 times a day  INCREASE Hydralazine to 50 mg 2 times a day  TAKE HCTZ as needed for swelling   *If you need a refill on your cardiac medications before your next appointment, please call your pharmacy*  Lab Work: NONE ordered at this time of appointment   If you have labs (blood work) drawn today and your tests are completely normal, you will receive your results only by: Marland Kitchen MyChart Message (if you have MyChart) OR . A paper copy in the mail If you have any lab test that is abnormal or we need to change your treatment, we will call you to review the results.  Testing/Procedures: NONE ordered at this time of appointment   Follow-Up: At [facility], you and your health needs are our priority.  As part of our continuing mission to provide you with exceptional heart care, we have created designated Provider Care Teams.  These Care Teams include your primary Cardiologist (physician) and Advanced Practice Providers (APPs -  Physician Assistants and Nurse Practitioners) who all work together to provide you with the care you need, when you need it.   Your next appointment:   3 week(s)  The format for your next appointment:   In Person  Provider:   Almyra Deforest, PA-C  Other Instructions      Signed, Almyra Deforest, [place]  12/03/2019 11:49 PM    [place]

## 2019-12-01 NOTE — Patient Instructions (Addendum)
Medication Instructions:   INCREASE Carvedilol (Coreg) to 18.75 mg (1.5 tablets) 2 times a day  INCREASE Hydralazine to 50 mg 2 times a day  TAKE HCTZ as needed for swelling   *If you need a refill on your cardiac medications before your next appointment, please call your pharmacy*  Lab Work: NONE ordered at this time of appointment   If you have labs (blood work) drawn today and your tests are completely normal, you will receive your results only by: Marland Kitchen MyChart Message (if you have MyChart) OR . A paper copy in the mail If you have any lab test that is abnormal or we need to change your treatment, we will call you to review the results.  Testing/Procedures: NONE ordered at this time of appointment   Follow-Up: At [facility], you and your health needs are our priority.  As part of our continuing mission to provide you with exceptional heart care, we have created designated Provider Care Teams.  These Care Teams include your primary Cardiologist (physician) and Advanced Practice Providers (APPs -  Physician Assistants and Nurse Practitioners) who all work together to provide you with the care you need, when you need it.   Your next appointment:   3 week(s)  The format for your next appointment:   In Person  Provider:   Almyra Deforest, PA-C  Other Instructions

## 2019-12-17 ENCOUNTER — Other Ambulatory Visit: Payer: Self-pay | Admitting: Family Medicine

## 2019-12-17 DIAGNOSIS — E1169 Type 2 diabetes mellitus with other specified complication: Secondary | ICD-10-CM

## 2019-12-17 DIAGNOSIS — E669 Obesity, unspecified: Secondary | ICD-10-CM

## 2019-12-18 ENCOUNTER — Encounter: Payer: Self-pay | Admitting: Family Medicine

## 2019-12-22 ENCOUNTER — Ambulatory Visit: Payer: PPO | Admitting: Physician Assistant

## 2019-12-22 ENCOUNTER — Encounter: Payer: Self-pay | Admitting: Physician Assistant

## 2019-12-22 ENCOUNTER — Other Ambulatory Visit: Payer: Self-pay

## 2019-12-22 VITALS — BP 128/76 | HR 62 | Temp 97.2°F | Ht 63.0 in | Wt 189.3 lb

## 2019-12-22 DIAGNOSIS — E119 Type 2 diabetes mellitus without complications: Secondary | ICD-10-CM | POA: Diagnosis not present

## 2019-12-22 DIAGNOSIS — I2510 Atherosclerotic heart disease of native coronary artery without angina pectoris: Secondary | ICD-10-CM

## 2019-12-22 DIAGNOSIS — I10 Essential (primary) hypertension: Secondary | ICD-10-CM | POA: Diagnosis not present

## 2019-12-22 DIAGNOSIS — E785 Hyperlipidemia, unspecified: Secondary | ICD-10-CM

## 2019-12-22 NOTE — Patient Instructions (Signed)
Medication Instructions:  Your physician recommends that you continue on your current medications as directed. Please refer to the Current Medication list given to you today.  *If you need a refill on your cardiac medications before your next appointment, please call your pharmacy*  Lab Work: NONE ordered at this time of appointment   If you have labs (blood work) drawn today and your tests are completely normal, you will receive your results only by: Marland Kitchen MyChart Message (if you have MyChart) OR . A paper copy in the mail If you have any lab test that is abnormal or we need to change your treatment, we will call you to review the results.  Testing/Procedures: NONE ordered at this time of appointment   Follow-Up: At [facility], you and your health needs are our priority.  As part of our continuing mission to provide you with exceptional heart care, we have created designated Provider Care Teams.  These Care Teams include your primary Cardiologist (physician) and Advanced Practice Providers (APPs -  Physician Assistants and Nurse Practitioners) who all work together to provide you with the care you need, when you need it.  Your next appointment:   6-8 month(s)  The format for your next appointment:   In Person  Provider:   Sanda Klein, MD  Other Instructions

## 2019-12-22 NOTE — Progress Notes (Signed)
Cardiology Office Note:    Date:  12/24/2019   ID:  Aybree, [name] [birth year], MRN [id number]  PCP:  Elby Beck, FNP  [facility] Cardiologist:  Kirk Ruths, MD  [facility] Electrophysiologist:  None   Referring MD: Elby Beck, FNP   Chief Complaint  Patient presents with  . Follow-up    seen for Dr. Stanford Breed    History of Present Illness:    [name] [name] is a 69 y.o. female with a hx of CAD, history of fatty liver disease, HTN, HLD, OSA and DM2. Patient had prior history of PCI to the first diagonal in May 2003 in the setting of NSTEMI.Her most recent cardiac catheterization in April 2004 showed no other significant obstructive disease, normal EF. Myoview in December 2012 was normal. ETT in December 2015 showed borderline changes in the inferior lead. She was seen by her PCP in December 2019 for fatigue. It was suspected patient may had a virus. Her blood pressure was elevated, Toprol-XL was increased to 100 mg daily. Amlodipine was increased to 10 mg daily. TSH was normal. His CBC did not show any sign of anemia.I last saw the patient on 07/02/2018 for evaluation of increasing shortness of breath. Her dyspnea mainly occurred with exertion and denies any chest pain. Echocardiogram obtained on 07/09/2018 showed EF 55 to 60%, small pericardial effusion otherwise no significant valvular issue. Myoview obtained on the following day on 07/10/2018 was low risk with normal perfusion.  Abdominal ultrasound obtained in August 2020 showed no aneurysm.  Patient was last seen by Dr. Stanford Breed in December 2020, at which time she was doing well.  Blood pressure was normal however she was describing some pedal edema which was felt to be attributed by amlodipine, her amlodipine was discontinued and hydralazine 25 mg 3 times daily was added.  Unfortunately hydralazine was not controlling her blood pressure very well and had difficulty remembering to take another  dose during lunch, therefore she was switched to clonidine.  She also had headache which was felt to be related to hydralazine as well.  To further help her with her blood pressure, previous Toprol-XL was discontinued and she was started on carvedilol 12.5 mg twice daily.  I last saw the patient on 12/01/2019, blood pressure was still elevated.  Heart rate was in the 50s to low 60s range.  I recommended increase carvedilol to 18.75 mg twice a day and increase hydralazine to 50 mg twice a day.  Patient presents today for follow-up.  Blood pressure is better controlled on the current therapy.  She denies any dizziness, headache, chest pain or shortness of breath.  We will continue on the current therapy and follow-up in 6 to 8 months.  Past Medical History:  Diagnosis Date  . Acquired deformity of right foot   . CAD (coronary artery disease) primary cardioloigst-  dr Stanford Breed   hx NSTEMI 10-24-2001  s/p  cardiac cath w/ PCI and DES x1 to first diagonal  . Chronic constipation   . Depression   . Fatty liver 04/26/2015  . GERD (gastroesophageal reflux disease)   . History of abnormal cervical Pap smear    ACUS 2012  . History of exercise stress test 05-25-2014  dr Stanford Breed   borderline (indeterminate) with non-specific ST changes (mild ST depression in leads II,III, aVF, & V6, did not meet criteria for ischemia) ;  pt had low intolerence, no cp, normal heartrate and bp response, no arrhythmias  . History of  goiter    as child s/p  removal ,  per pt benign , neck area  (not thyroid)  . History of non-ST elevation myocardial infarction (NSTEMI) 10/24/2001   s/p  PCI and DES to first diagonal  . Hyperlipidemia   . Hypertension   . OSA on CPAP pulmoloigst-  dr young   per study 11-07-2004  severe osa  . Right foot pain   . S/P drug eluting coronary stent placement 10/27/2001   x1 to first diagonal   . Seasonal and perennial allergic rhinitis   . Type 2 diabetes mellitus ([place])   . Vitamin D  deficiency   . Wears dentures    upper and lower partial denture  . Wears glasses     Past Surgical History:  Procedure Laterality Date  . BUNIONECTOMY Left 2012  . BUNIONECTOMY Right 04/19/2017   Procedure: RIGHT BUNIONECTOMY;  Surgeon: Rosemary Holms, DPM;  Location: [facility];  Service: Podiatry;  Laterality: Right;  . CARDIAC CATHETERIZATION  09-11-2002   dr Lia Foyer   well-preserved LVF; continued patency previouly placed stent in diagonal branch;  mild luminal irregularities   . CARDIOVASCULAR STRESS TEST  05-16-2011   dr Stanford Breed   normal nuclear study w/ no evidence ischemia/  normal LV function and wall motion , ef 77%  . CORONARY ANGIOPLASTY WITH STENT PLACEMENT  10-27-2001   dr Lia Foyer   high-grade stenosis first diagonal post PCI and DES (TAXUS);  mild luminal irregularity throughout LAD, RCA, and very mild CFx;  preserved LVF  . goiter removal  1968   per pt benign tumor removed from neck beside throat  . HALLUX VALGUS LAPIDUS Right 04/19/2017   Procedure: HALLUX VALGUS LAPIDUS FUSION;  Surgeon: Rosemary Holms, DPM;  Location: [place];  Service: Podiatry;  Laterality: Right;  . HAMMER TOE SURGERY Right 04/19/2017   Procedure: HAMMER TOE CORRECTION RIGHT 2ND;  Surgeon: Rosemary Holms, DPM;  Location: [facility];  Service: Podiatry;  Laterality: Right;  . METATARSAL OSTEOTOMY Right 04/19/2017   Procedure: 2ND METATARSAL OSTEOTOMY;  Surgeon: Rosemary Holms, DPM;  Location: [place];  Service: Podiatry;  Laterality: Right;  . TONSILLECTOMY AND ADENOIDECTOMY  age 66  . TUBAL LIGATION Bilateral 1985    Current Medications: Current Meds  Medication Sig  . albuterol (VENTOLIN HFA) 108 (90 Base) MCG/ACT inhaler Inhale 2 puffs into the lungs every 4 (four) hours as needed for wheezing or shortness of breath (cough, shortness of breath or wheezing.).  Marland Kitchen aspirin 81 MG tablet Take 81 mg by mouth daily.    . Blood  Glucose Monitoring Suppl (ONE TOUCH ULTRA 2) w/Device KIT Use to check blood sugar 3 times a day.  Marland Kitchen buPROPion (WELLBUTRIN XL) 150 MG 24 hr tablet TAKE 3 TABLETS(450 MG) BY MOUTH EVERY MORNING  . Calcium Carbonate-Vitamin D (CALTRATE 600+D) 600-400 MG-UNIT per tablet Take 2 tablets 2 (two) times daily by mouth.   . carvedilol (COREG) 12.5 MG tablet Take 1.5 tablets (18.75 mg total) by mouth 2 (two) times daily.  . Cholecalciferol (VITAMIN D3) 3000 units TABS Take 1 tablet by mouth daily.  . cloNIDine (CATAPRES) 0.1 MG tablet Take 1 tablet (0.1 mg total) by mouth 2 (two) times daily.  . fluticasone (FLONASE) 50 MCG/ACT nasal spray Place 2 sprays into both nostrils daily.  Marland Kitchen glucose blood (ONE TOUCH ULTRA TEST) test strip 1 each by Other route 3 (three) times daily. for testing  . hydrALAZINE (APRESOLINE) 50 MG  tablet Take 1 tablet (50 mg total) by mouth in the morning and at bedtime.  . hydrochlorothiazide (MICROZIDE) 12.5 MG capsule TAKE 1 CAPSULE BY MOUTH EVERY DAY AS NEEDED FOR EDEMA  . Lancets (ONETOUCH ULTRASOFT) lancets Use to check blood sugar 3 times a day.  . meloxicam (MOBIC) 7.5 MG tablet Take 1 tablet (7.5 mg total) by mouth daily as needed for pain.  . metFORMIN (GLUCOPHAGE-XR) 750 MG 24 hr tablet TAKE 1 TABLET(750 MG) BY MOUTH TWICE DAILY WITH A MEAL  . Multiple Vitamins-Minerals (MULTIVITAMIN ADULTS 50+ PO) Take 1 tablet every morning by mouth.   . olmesartan-hydrochlorothiazide (BENICAR HCT) 40-12.5 MG tablet Take 1 tablet by mouth daily.  . pantoprazole (PROTONIX) 40 MG tablet TAKE 1 TABLET(40 MG) BY MOUTH EVERY MORNING  . Probiotic Product (PROBIOTIC DAILY PO) Take 1 capsule every morning by mouth.   . rosuvastatin (CRESTOR) 20 MG tablet Take 1 tablet (20 mg total) by mouth daily.     Allergies:   Patient has no known allergies.   Social History   Socioeconomic History  . Marital status: Married    Spouse name: Not on file  . Number of children: 3  . Years of education:  Not on file  . Highest education level: Not on file  Occupational History  . 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 Smoking status: Former Smoker    Years: 46.00    Types: Cigarettes    Quit date: 09/23/2012    Years since quitting: 7.2  . Smokeless tobacco: Never Used  Vaping Use  . Vaping Use: Never used  Substance and Sexual Activity  . Alcohol use: Yes    Alcohol/week: 0.0 standard drinks    Comment: occasional wine  . Drug use: No  . Sexual activity: Not Currently  Other Topics Concern  . Not on file  Social History Narrative   Caffeine Use:  2 cups coffee daily   Regular exercise:  No      Social Determinants of Health   Financial Resource Strain:   . Difficulty of Paying Living Expenses:   Food Insecurity:   . Worried About Charity fundraiser in the Last Year:   . Arboriculturist in the Last Year:   Transportation Needs:   . Film/video editor (Medical):   Marland Kitchen Lack of Transportation (Non-Medical):   Physical Activity:   . Days of Exercise per Week:   . Minutes of Exercise per Session:   Stress:   . Feeling of Stress :   Social Connections:   . Frequency of Communication with Friends and Family:   . Frequency of Social Gatherings with Friends and Family:   . Attends Religious Services:   . Active Member of Clubs or Organizations:   . Attends Archivist Meetings:   Marland Kitchen Marital Status:      Family History: The patient's family history includes Alcoholism in her mother; Cancer in her father and mother; Diabetes in her mother and sister; Heart attack in her mother and sister; Heart disease in her mother; Hyperlipidemia in her sister; Hypertension in her mother and sister; Stroke in her mother; Thyroid disease in her mother. There is no history of Colon cancer or Breast cancer.  ROS:   Please see the history of present illness.     All other systems reviewed and are  negative.  EKGs/Labs/Other Studies Reviewed:  The following studies were reviewed today:  Echo 07/09/2018 1. The left ventricle appears to be normal in size, have normal wall  thickness, with normal systolic function of 27-06%. Echo evidence of  impaired relaxation in diastolic filling patterns.  2. Right ventricular systolic pressure is is mildly elevated.  3. The right ventricle is normal in size, has normal wall thickness and  normal systolic function.  4. Normal left atrial size.  5. Normal right atrial size.  6. Small pericardial effusion, as described above.  7. The pericardial effusion is globally pericardial effusion.  8. Mitral valve regurgitation is trivial by color flow Doppler.  9. The mitral valve normal in structure and function.  10. Normal tricuspid valve.  11. Aortic valve normal.  12. The inferior vena cava was normal in size <50% respiratory variablity.  13. No atrial level shunt detected by color flow Doppler.    Myoview 07/10/2018  Nuclear stress EF: 67%.  The left ventricular ejection fraction is hyperdynamic (>65%).  There was no ST segment deviation noted during stress.  No T wave inversion was noted during stress.  Defect 1: There is a small defect of mild severity present in the mid anterior location.  The study is normal.  This is a low risk study.   Low risk stress nuclear study with normal perfusion and normal left ventricular regional and global systolic function   EKG:  EKG is not ordered today.    Recent Labs: 06/04/2019: ALT 52; BUN 16; Creatinine, Ser 0.76; NT-Pro BNP 47; Potassium 4.2; Sodium 142  Recent Lipid Panel    Component Value Date/Time   CHOL 112 06/04/2019 1038   TRIG 133 06/04/2019 1038   HDL 48 06/04/2019 1038   CHOLHDL 2.3 06/04/2019 1038   CHOLHDL 2 10/06/2018 1058   VLDL 30.8 10/06/2018 1058   LDLCALC 41 06/04/2019 1038   LDLDIRECT 77.7 12/27/2009 0942    Physical Exam:    VS:  BP 128/76   Pulse  62   Temp (!) 97.2 F (36.2 C) (Other (Comment)) Comment (Src): Forehead  Ht 5' 3" (1.6 m)   Wt 189 lb 4.8 oz (85.9 kg)   SpO2 95%   BMI 33.53 kg/m     Wt Readings from Last 3 Encounters:  12/22/19 189 lb 4.8 oz (85.9 kg)  12/01/19 188 lb 12.8 oz (85.6 kg)  06/04/19 195 lb 12.8 oz (88.8 kg)     GEN:  Well nourished, well developed in no acute distress HEENT: Normal NECK: No JVD; No carotid bruits LYMPHATICS: No lymphadenopathy CARDIAC: RRR, no murmurs, rubs, gallops RESPIRATORY:  Clear to auscultation without rales, wheezing or rhonchi  ABDOMEN: Soft, non-tender, non-distended MUSCULOSKELETAL:  No edema; No deformity  SKIN: Warm and dry NEUROLOGIC:  Alert and oriented x 3 PSYCHIATRIC:  Normal affect   ASSESSMENT:    1. Coronary artery disease involving native coronary artery of native heart without angina pectoris   2. Essential hypertension   3. Hyperlipidemia LDL goal <70   4. Controlled type 2 diabetes mellitus without complication, without long-term current use of insulin (HCC)    PLAN:    In order of problems listed above:  1. CAD: Denies any chest pain.  Continue aspirin, carvedilol and statin  2. Hypertension: I increased her carvedilol and hydralazine during the last office visit.  Her blood pressure is better controlled  3. Hyperlipidemia: Continue statin therapy  4. DM2: Managed by primary care provider.   Medication Adjustments/Labs and Tests Ordered: Current medicines  are reviewed at length with the patient today.  Concerns regarding medicines are outlined above.  No orders of the defined types were placed in this encounter.  No orders of the defined types were placed in this encounter.   Patient Instructions  Medication Instructions:  Your physician recommends that you continue on your current medications as directed. Please refer to the Current Medication list given to you today.  *If you need a refill on your cardiac medications before your  next appointment, please call your pharmacy*  Lab Work: NONE ordered at this time of appointment   If you have labs (blood work) drawn today and your tests are completely normal, you will receive your results only by: Marland Kitchen MyChart Message (if you have MyChart) OR . A paper copy in the mail If you have any lab test that is abnormal or we need to change your treatment, we will call you to review the results.  Testing/Procedures: NONE ordered at this time of appointment   Follow-Up: At [facility], you and your health needs are our priority.  As part of our continuing mission to provide you with exceptional heart care, we have created designated Provider Care Teams.  These Care Teams include your primary Cardiologist (physician) and Advanced Practice Providers (APPs -  Physician Assistants and Nurse Practitioners) who all work together to provide you with the care you need, when you need it.  Your next appointment:   6-8 month(s)  The format for your next appointment:   In Person  Provider:   Sanda Klein, MD  Other Instructions      Signed, Almyra Deforest, [place]  12/24/2019 11:37 PM    [place]

## 2019-12-24 ENCOUNTER — Encounter: Payer: Self-pay | Admitting: Physician Assistant

## 2020-01-04 ENCOUNTER — Encounter: Payer: Self-pay | Admitting: Family Medicine

## 2020-01-05 ENCOUNTER — Other Ambulatory Visit: Payer: Self-pay

## 2020-01-10 ENCOUNTER — Other Ambulatory Visit: Payer: Self-pay | Admitting: Family Medicine

## 2020-01-10 DIAGNOSIS — E669 Obesity, unspecified: Secondary | ICD-10-CM

## 2020-01-10 DIAGNOSIS — E1169 Type 2 diabetes mellitus with other specified complication: Secondary | ICD-10-CM

## 2020-01-12 ENCOUNTER — Encounter: Payer: Self-pay | Admitting: Family Medicine

## 2020-01-12 DIAGNOSIS — E669 Obesity, unspecified: Secondary | ICD-10-CM

## 2020-01-12 DIAGNOSIS — E1169 Type 2 diabetes mellitus with other specified complication: Secondary | ICD-10-CM

## 2020-01-13 MED ORDER — ONETOUCH ULTRASOFT LANCETS MISC: 1 refills | Status: DC

## 2020-01-13 MED ORDER — GLUCOSE BLOOD VI STRP: 1.0000 | ORAL_STRIP | Freq: Three times a day (TID) | 1 refills | Status: DC

## 2020-01-13 NOTE — Telephone Encounter (Signed)
Pt has CPE scheduled for 02/17/20. Since she is overdue for her CPE refills were sent in for 2 months.

## 2020-01-14 DIAGNOSIS — H2513 Age-related nuclear cataract, bilateral: Secondary | ICD-10-CM | POA: Diagnosis not present

## 2020-01-14 LAB — HM DIABETES EYE EXAM

## 2020-01-18 ENCOUNTER — Other Ambulatory Visit: Payer: Self-pay | Admitting: Family Medicine

## 2020-01-18 DIAGNOSIS — E669 Obesity, unspecified: Secondary | ICD-10-CM

## 2020-01-18 DIAGNOSIS — E1169 Type 2 diabetes mellitus with other specified complication: Secondary | ICD-10-CM

## 2020-01-18 NOTE — Telephone Encounter (Signed)
Last refilled 01/07/20 for #60 with 0 refills.  Last OV was 06/18/19, and has an upcoming appt 02/17/20. OK to refill?

## 2020-01-19 ENCOUNTER — Other Ambulatory Visit: Payer: Self-pay | Admitting: Family Medicine

## 2020-01-19 DIAGNOSIS — E1169 Type 2 diabetes mellitus with other specified complication: Secondary | ICD-10-CM

## 2020-01-19 DIAGNOSIS — E669 Obesity, unspecified: Secondary | ICD-10-CM

## 2020-01-19 MED ORDER — ONETOUCH VERIO VI STRP: ORAL_STRIP | 0 refills | Status: DC

## 2020-01-19 NOTE — Telephone Encounter (Signed)
One Touch Verio test strips sent to [facility] as requested.  I spoke with patient about lancets to advise there is not an One Touch Verio lancet.  She should be able to use any One Touch lancet to stick her fingers with.

## 2020-01-19 NOTE — Telephone Encounter (Signed)
Pt called stating she received wrong test strips and lancets what she received was for one touch ultra.  Pt needs rx for one touch verio test strips and lancets  Pt is out of her test strips and lancets  [facility] [place],

## 2020-01-25 ENCOUNTER — Encounter: Payer: Self-pay | Admitting: Family Medicine

## 2020-01-26 ENCOUNTER — Encounter: Payer: Self-pay | Admitting: Family Medicine

## 2020-02-10 ENCOUNTER — Other Ambulatory Visit (INDEPENDENT_AMBULATORY_CARE_PROVIDER_SITE_OTHER): Payer: PPO

## 2020-02-10 ENCOUNTER — Other Ambulatory Visit: Payer: Self-pay

## 2020-02-10 ENCOUNTER — Other Ambulatory Visit: Payer: Self-pay | Admitting: Family Medicine

## 2020-02-10 ENCOUNTER — Ambulatory Visit (INDEPENDENT_AMBULATORY_CARE_PROVIDER_SITE_OTHER): Payer: PPO

## 2020-02-10 DIAGNOSIS — E785 Hyperlipidemia, unspecified: Secondary | ICD-10-CM | POA: Diagnosis not present

## 2020-02-10 DIAGNOSIS — K760 Fatty (change of) liver, not elsewhere classified: Secondary | ICD-10-CM

## 2020-02-10 DIAGNOSIS — E559 Vitamin D deficiency, unspecified: Secondary | ICD-10-CM | POA: Diagnosis not present

## 2020-02-10 DIAGNOSIS — Z0000 Encounter for general adult medical examination without abnormal findings: Secondary | ICD-10-CM

## 2020-02-10 DIAGNOSIS — E669 Obesity, unspecified: Secondary | ICD-10-CM

## 2020-02-10 DIAGNOSIS — E1169 Type 2 diabetes mellitus with other specified complication: Secondary | ICD-10-CM

## 2020-02-10 DIAGNOSIS — I10 Essential (primary) hypertension: Secondary | ICD-10-CM

## 2020-02-10 LAB — CBC WITH DIFFERENTIAL/PLATELET
Basophils Absolute: 0 10*3/uL (ref 0.0–0.1)
Basophils Relative: 0.7 % (ref 0.0–3.0)
Eosinophils Absolute: 0.1 10*3/uL (ref 0.0–0.7)
Eosinophils Relative: 3.1 % (ref 0.0–5.0)
HCT: 37 % (ref 36.0–46.0)
Hemoglobin: 12.4 g/dL (ref 12.0–15.0)
Lymphocytes Relative: 36.7 % (ref 12.0–46.0)
Lymphs Abs: 1.6 10*3/uL (ref 0.7–4.0)
MCHC: 33.4 g/dL (ref 30.0–36.0)
MCV: 84.9 fl (ref 78.0–100.0)
Monocytes Absolute: 0.3 10*3/uL (ref 0.1–1.0)
Monocytes Relative: 7 % (ref 3.0–12.0)
Neutro Abs: 2.3 10*3/uL (ref 1.4–7.7)
Neutrophils Relative %: 52.5 % (ref 43.0–77.0)
Platelets: 157 10*3/uL (ref 150.0–400.0)
RBC: 4.36 Mil/uL (ref 3.87–5.11)
RDW: 15.6 % — ABNORMAL HIGH (ref 11.5–15.5)
WBC: 4.4 10*3/uL (ref 4.0–10.5)

## 2020-02-10 LAB — COMPREHENSIVE METABOLIC PANEL
ALT: 24 U/L (ref 0–35)
AST: 19 U/L (ref 0–37)
Albumin: 4.4 g/dL (ref 3.5–5.2)
Alkaline Phosphatase: 51 U/L (ref 39–117)
BUN: 15 mg/dL (ref 6–23)
CO2: 32 mEq/L (ref 19–32)
Calcium: 9.5 mg/dL (ref 8.4–10.5)
Chloride: 103 mEq/L (ref 96–112)
Creatinine, Ser: 0.74 mg/dL (ref 0.40–1.20)
GFR: 77.58 mL/min (ref >60.00)
Glucose, Bld: 122 mg/dL — ABNORMAL HIGH (ref 70–99)
Potassium: 3.7 mEq/L (ref 3.5–5.1)
Sodium: 141 mEq/L (ref 135–145)
Total Bilirubin: 0.5 mg/dL (ref 0.2–1.2)
Total Protein: 6.9 g/dL (ref 6.0–8.3)

## 2020-02-10 LAB — LIPID PANEL
Cholesterol: 115 mg/dL (ref 0–200)
HDL: 52.6 mg/dL (ref >39.00)
LDL Cholesterol: 32 mg/dL (ref 0–99)
NonHDL: 62.65
Total CHOL/HDL Ratio: 2
Triglycerides: 155 mg/dL — ABNORMAL HIGH (ref 0.0–149.0)
VLDL: 31 mg/dL (ref 0.0–40.0)

## 2020-02-10 LAB — MICROALBUMIN / CREATININE URINE RATIO
Creatinine,U: 18.4 mg/dL
Microalb Creat Ratio: 6.8 mg/g (ref 0.0–30.0)
Microalb, Ur: 1.3 mg/dL (ref 0.0–1.9)

## 2020-02-10 LAB — VITAMIN D 25 HYDROXY (VIT D DEFICIENCY, FRACTURES): VITD: 80.69 ng/mL (ref 30.00–100.00)

## 2020-02-10 LAB — HEMOGLOBIN A1C: Hgb A1c MFr Bld: 6.6 % — ABNORMAL HIGH (ref 4.6–6.5)

## 2020-02-10 NOTE — Progress Notes (Signed)
Subjective:   [name] [name] is a 69 y.o. female who presents for Medicare Annual (Subsequent) preventive examination.  Review of Systems: N/A      I connected with the patient today by telephone and verified that I am speaking with the correct person using two identifiers. Location patient: home Location nurse: work Persons participating in the telephone visit: patient, nurse.   I discussed the limitations, risks, security and privacy concerns of performing an evaluation and management service by telephone and the availability of in person appointments. I also discussed with the patient that there may be a patient responsible charge related to this service. The patient expressed understanding and verbally consented to this telephonic visit.        Cardiac Risk Factors include: advanced age (>59men, >26 women);diabetes mellitus;hypertension     Objective:    Today's Vitals   02/10/20 0858  PainSc: 6    There is no height or weight on file to calculate BMI.  Advanced Directives 02/10/2020 11/13/2018 04/19/2017  Does Patient Have a Medical Advance Directive? No No No  Would patient like information on creating a medical advance directive? No - Patient declined No - Patient declined No - Patient declined    Current Medications (verified) Outpatient Encounter Medications as of 02/10/2020  Medication Sig  . albuterol (VENTOLIN HFA) 108 (90 Base) MCG/ACT inhaler Inhale 2 puffs into the lungs every 4 (four) hours as needed for wheezing or shortness of breath (cough, shortness of breath or wheezing.).  Marland Kitchen aspirin 81 MG tablet Take 81 mg by mouth daily.    Marland Kitchen buPROPion (WELLBUTRIN XL) 150 MG 24 hr tablet TAKE 3 TABLETS(450 MG) BY MOUTH EVERY MORNING  . Calcium Carbonate-Vitamin D (CALTRATE 600+D) 600-400 MG-UNIT per tablet Take 2 tablets 2 (two) times daily by mouth.   . carvedilol (COREG) 12.5 MG tablet Take 1.5 tablets (18.75 mg total) by mouth 2 (two) times daily.  . Cholecalciferol  (VITAMIN D3) 3000 units TABS Take 1 tablet by mouth daily.  . cloNIDine (CATAPRES) 0.1 MG tablet Take 1 tablet (0.1 mg total) by mouth 2 (two) times daily.  . fluticasone (FLONASE) 50 MCG/ACT nasal spray Place 2 sprays into both nostrils daily.  Marland Kitchen glucose blood (ONETOUCH VERIO) test strip Use to check blood sugar 3 times a day  . hydrALAZINE (APRESOLINE) 50 MG tablet Take 1 tablet (50 mg total) by mouth in the morning and at bedtime.  . hydrochlorothiazide (MICROZIDE) 12.5 MG capsule TAKE 1 CAPSULE BY MOUTH EVERY DAY AS NEEDED FOR EDEMA  . Lancets (ONETOUCH ULTRASOFT) lancets Use to check blood sugar 3 times a day.  . meloxicam (MOBIC) 7.5 MG tablet Take 1 tablet (7.5 mg total) by mouth daily as needed for pain.  . metFORMIN (GLUCOPHAGE-XR) 750 MG 24 hr tablet TAKE 1 TABLET(750 MG) BY MOUTH TWICE DAILY WITH A MEAL.Marland Kitchen NEED DOCTOR OFFICE VISIT/PHYSICAL  . Multiple Vitamins-Minerals (MULTIVITAMIN ADULTS 50+ PO) Take 1 tablet every morning by mouth.   . olmesartan-hydrochlorothiazide (BENICAR HCT) 40-12.5 MG tablet Take 1 tablet by mouth daily.  . pantoprazole (PROTONIX) 40 MG tablet TAKE 1 TABLET(40 MG) BY MOUTH EVERY MORNING  . Probiotic Product (PROBIOTIC DAILY PO) Take 1 capsule every morning by mouth.   . rosuvastatin (CRESTOR) 20 MG tablet Take 1 tablet (20 mg total) by mouth daily.   No facility-administered encounter medications on file as of 02/10/2020.    Allergies (verified) Patient has no known allergies.   History: Past Medical History:  Diagnosis Date  .  Acquired deformity of right foot   . CAD (coronary artery disease) primary cardioloigst-  dr Stanford Breed   hx NSTEMI 10-24-2001  s/p  cardiac cath w/ PCI and DES x1 to first diagonal  . Chronic constipation   . Depression   . Fatty liver 04/26/2015  . GERD (gastroesophageal reflux disease)   . History of abnormal cervical Pap smear    ACUS 2012  . History of exercise stress test 05-25-2014  dr Stanford Breed   borderline  (indeterminate) with non-specific ST changes (mild ST depression in leads II,III, aVF, & V6, did not meet criteria for ischemia) ;  pt had low intolerence, no cp, normal heartrate and bp response, no arrhythmias  . History of goiter    as child s/p  removal ,  per pt benign , neck area  (not thyroid)  . History of non-ST elevation myocardial infarction (NSTEMI) 10/24/2001   s/p  PCI and DES to first diagonal  . Hyperlipidemia   . Hypertension   . OSA on CPAP pulmoloigst-  dr young   per study 11-07-2004  severe osa  . Right foot pain   . S/P drug eluting coronary stent placement 10/27/2001   x1 to first diagonal   . Seasonal and perennial allergic rhinitis   . Type 2 diabetes mellitus ([place])   . Vitamin D deficiency   . Wears dentures    upper and lower partial denture  . Wears glasses    Past Surgical History:  Procedure Laterality Date  . BUNIONECTOMY Left 2012  . BUNIONECTOMY Right 04/19/2017   Procedure: RIGHT BUNIONECTOMY;  Surgeon: Rosemary Holms, DPM;  Location: [facility];  Service: Podiatry;  Laterality: Right;  . CARDIAC CATHETERIZATION  09-11-2002   dr Lia Foyer   well-preserved LVF; continued patency previouly placed stent in diagonal branch;  mild luminal irregularities   . CARDIOVASCULAR STRESS TEST  05-16-2011   dr Stanford Breed   normal nuclear study w/ no evidence ischemia/  normal LV function and wall motion , ef 77%  . CORONARY ANGIOPLASTY WITH STENT PLACEMENT  10-27-2001   dr Lia Foyer   high-grade stenosis first diagonal post PCI and DES (TAXUS);  mild luminal irregularity throughout LAD, RCA, and very mild CFx;  preserved LVF  . goiter removal  1968   per pt benign tumor removed from neck beside throat  . HALLUX VALGUS LAPIDUS Right 04/19/2017   Procedure: HALLUX VALGUS LAPIDUS FUSION;  Surgeon: Rosemary Holms, DPM;  Location: [place];  Service: Podiatry;  Laterality: Right;  . HAMMER TOE SURGERY Right 04/19/2017   Procedure: HAMMER TOE  CORRECTION RIGHT 2ND;  Surgeon: Rosemary Holms, DPM;  Location: [facility];  Service: Podiatry;  Laterality: Right;  . METATARSAL OSTEOTOMY Right 04/19/2017   Procedure: 2ND METATARSAL OSTEOTOMY;  Surgeon: Rosemary Holms, DPM;  Location: [place];  Service: Podiatry;  Laterality: Right;  . TONSILLECTOMY AND ADENOIDECTOMY  age 69  . TUBAL LIGATION Bilateral 1985   Family History  Problem Relation Age of Onset  . Cancer Mother        throat, cervical  . Diabetes Mother   . Stroke Mother   . Heart attack Mother   . Heart disease Mother   . Hypertension Mother   . Thyroid disease Mother   . Alcoholism Mother   . Cancer Father        prostate  . Diabetes Sister   . Heart attack Sister   . Hypertension Sister   .  Hyperlipidemia Sister   . Colon cancer Neg Hx   . Breast cancer Neg Hx    Social History   Socioeconomic History  . Marital status: Married    Spouse name: Not on file  . Number of children: 3  . Years of education: Not on file  . Highest education level: Not on file  Occupational History  . 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 Smoking status: Former Smoker    Years: 46.00    Types: Cigarettes    Quit date: 09/23/2012    Years since quitting: 7.3  . Smokeless tobacco: Never Used  Vaping Use  . Vaping Use: Never used  Substance and Sexual Activity  . Alcohol use: Yes    Alcohol/week: 0.0 standard drinks    Comment: occasional wine  . Drug use: No  . Sexual activity: Not Currently  Other Topics Concern  . Not on file  Social History Narrative   Caffeine Use:  2 cups coffee daily   Regular exercise:  No      Social Determinants of Health   Financial Resource Strain: Low Risk   . Difficulty of Paying Living Expenses: Not hard at all  Food Insecurity: No Food Insecurity  . Worried About Charity fundraiser in the Last Year: Never true  .  Ran Out of Food in the Last Year: Never true  Transportation Needs: No Transportation Needs  . Lack of Transportation (Medical): No  . Lack of Transportation (Non-Medical): No  Physical Activity: Inactive  . Days of Exercise per Week: 0 days  . Minutes of Exercise per Session: 0 min  Stress: No Stress Concern Present  . Feeling of Stress : Not at all  Social Connections:   . Frequency of Communication with Friends and Family: Not on file  . Frequency of Social Gatherings with Friends and Family: Not on file  . Attends Religious Services: Not on file  . Active Member of Clubs or Organizations: Not on file  . Attends Archivist Meetings: Not on file  . Marital Status: Not on file    Tobacco Counseling Counseling given: Not Answered   Clinical Intake:  Pre-visit preparation completed: Yes  Pain : 0-10 Pain Score: 6  Pain Type: Chronic pain Pain Location: Hand Pain Orientation: Left, Right Pain Descriptors / Indicators: Aching Pain Onset: More than a month ago Pain Frequency: Intermittent     Nutritional Risks: Nausea/ vomitting/ diarrhea Diabetes: Yes CBG done?: No Did pt. bring in CBG monitor from home?: No  How often do you need to have someone help you when you read instructions, pamphlets, or other written materials from your doctor or pharmacy?: 1 - Never What is the last grade level you completed in school?: 12th  Diabetic: Yes Nutrition Risk Assessment:  Has the patient had any N/V/D within the last 2 months?  Yes , diarrhea sometimes Does the patient have any non-healing wounds?  No  Has the patient had any unintentional weight loss or weight gain?  No   Diabetes:  Is the patient diabetic?  Yes  If diabetic, was a CBG obtained today?  No  Did the patient bring in their glucometer from home?  No  How often do you monitor your CBG's? When needed.   Financial Strains and Diabetes Management:  Are you having any financial strains with the  device, your supplies or your medication?  No .  Does the patient want to be seen by Chronic Care Management for management of their diabetes?  No  Would the patient like to be referred to a Nutritionist or for Diabetic Management?  No   Diabetic Exams:  Diabetic Eye Exam: Completed 01/14/2020 Diabetic Foot Exam: Overdue, Pt has been advised about the importance in completing this exam. Pt is scheduled for diabetic foot exam on 02/17/2020.   Interpreter Needed?: No  Information entered by :: CJohnson, LPN   Activities of Daily Living In your present state of health, do you have any difficulty performing the following activities: 02/10/2020  Hearing? Y  Vision? N  Difficulty concentrating or making decisions? N  Walking or climbing stairs? N  Dressing or bathing? N  Doing errands, shopping? N  Preparing Food and eating ? N  Using the Toilet? N  In the past six months, have you accidently leaked urine? Y  Do you have problems with loss of bowel control? Y  Managing your Medications? N  Managing your Finances? N  Housekeeping or managing your Housekeeping? N  Some recent data might be hidden    Patient Care Team: Elby Beck, FNP as PCP - General (Nurse Practitioner) Lelon Perla, MD as PCP - Cardiology (Cardiology) Lelon Perla, MD as Consulting Physician (Cardiology) Sable Feil, MD as Consulting Physician (Gastroenterology) Azucena Fallen, MD as Consulting Physician (Obstetrics and Gynecology)  Indicate any recent Medical Services you may have received from other than [facility] providers in the past year (date may be approximate).     Assessment:   This is a routine wellness examination for [place].  Hearing/Vision screen  Hearing Screening   125Hz  250Hz  500Hz  1000Hz  2000Hz  3000Hz  4000Hz  6000Hz  8000Hz   Right ear:           Left ear:           Vision Screening Comments: Patient gets annual eye exams   Dietary issues and exercise activities  discussed: Current Exercise Habits: The patient does not participate in regular exercise at present, Exercise limited by: None identified  Goals    . Patient Stated     Starting 11/13/18, I will continue to take medications as prescribed.     . Patient Stated     02/10/2020, I will maintain and continue medications as prescribed.       Depression Screen PHQ 2/9 Scores 02/10/2020 11/13/2018 07/14/2018 09/30/2017 09/05/2016 08/22/2016 04/25/2015  PHQ - 2 Score 0 0 0 0 2 0 0  PHQ- 9 Score 0 0 - 2 15 2  -    Fall Risk Fall Risk  02/10/2020 11/13/2018 07/14/2018 09/30/2017 03/27/2017  Falls in the past year? 0 0 1 No Yes  Number falls in past yr: 0 - 1 - 1  Injury with Fall? 0 - 0 - Yes  Risk for fall due to : Medication side effect - History of fall(s) - -  Follow up Falls evaluation completed;Falls prevention discussed - - - -    Any stairs in or around the home? Yes  If so, are there any without handrails? No  Home free of loose throw rugs in walkways, pet beds, electrical cords, etc? Yes  Adequate lighting in your home to reduce risk of falls? Yes   ASSISTIVE DEVICES UTILIZED TO PREVENT FALLS:  Life alert? No  Use of a cane, walker or w/c? No  Grab bars in the bathroom? No  Shower chair or bench in shower? No  Elevated toilet  seat or a handicapped toilet? No   TIMED UP AND GO:  Was the test performed? N/A, telephonic visit.    Cognitive Function: MMSE - Mini Mental State Exam 02/10/2020 11/13/2018  Orientation to time 5 5  Orientation to Place 5 5  Registration 3 3  Attention/ Calculation 5 0  Recall 3 3  Language- name 2 objects - 0  Language- repeat 1 1  Language- follow 3 step command - 0  Language- read & follow direction - 0  Write a sentence - 0  Copy design - 0  Total score - 17  Mini Cog  Mini-Cog screen was completed. Maximum score is 22. A value of 0 denotes this part of the MMSE was not completed or the patient failed this part of the Mini-Cog screening.        Immunizations Immunization History  Administered Date(s) Administered  . Influenza Split 03/05/2012  . Influenza, High Dose Seasonal PF 04/25/2015, 02/15/2016, 03/27/2017, 04/09/2018  . Influenza,inj,Quad PF,6+ Mos 03/31/2013, 02/03/2014  . PFIZER SARS-COV-2 Vaccination 09/01/2019, 09/22/2019  . Pneumococcal Conjugate-13 04/25/2015  . Pneumococcal Polysaccharide-23 05/09/2011, 08/22/2016  . Tdap 05/09/2011    TDAP status: Up to date Flu Vaccine status: due, will get at a later date  Pneumococcal vaccine status: Up to date Covid-19 vaccine status: Completed vaccines  Qualifies for Shingles Vaccine? Yes   Zostavax completed No   Shingrix Completed?: No.    Education has been provided regarding the importance of this vaccine. Patient has been advised to call insurance company to determine out of pocket expense if they have not yet received this vaccine. Advised may also receive vaccine at local pharmacy or Health Dept. Verbalized acceptance and understanding.  Screening Tests Health Maintenance  Topic Date Due  . FOOT EXAM  03/27/2018  . HEMOGLOBIN A1C  07/31/2019  . INFLUENZA VACCINE  01/10/2020  . MAMMOGRAM  08/11/2020  . OPHTHALMOLOGY EXAM  01/13/2021  . TETANUS/TDAP  05/08/2021  . COLONOSCOPY  05/29/2021  . DEXA SCAN  Completed  . COVID-19 Vaccine  Completed  . Hepatitis C Screening  Completed  . PNA vac Low Risk Adult  Completed    Health Maintenance  Health Maintenance Due  Topic Date Due  . FOOT EXAM  03/27/2018  . HEMOGLOBIN A1C  07/31/2019  . INFLUENZA VACCINE  01/10/2020    Colorectal cancer screening: Completed 05/30/2011. Repeat every 10 years Mammogram status: due, patient will call and schedule appointment Bone Density status: Completed 07/21/2018. Results reflect: Bone density results: NORMAL. Repeat every 2-5 years.  Lung Cancer Screening: (Low Dose CT Chest recommended if Age 71-80 years, 30 pack-year currently smoking OR have quit w/in 15 years.)  does not qualify.    Additional Screening:  Hepatitis C Screening: does qualify; Completed 12/29/2014  Vision Screening: Recommended annual ophthalmology exams for early detection of glaucoma and other disorders of the eye. Is the patient up to date with their annual eye exam?  Yes  Who is the provider or what is the name of the office in which the patient attends annual eye exams? [facility] If pt is not established with a provider, would they like to be referred to a provider to establish care? No .   Dental Screening: Recommended annual dental exams for proper oral hygiene  Community Resource Referral / Chronic Care Management: CRR required this visit?  No   CCM required this visit?  No      Plan:     I have personally reviewed  and noted the following in the patient's chart:   . Medical and social history . Use of alcohol, tobacco or illicit drugs  . Current medications and supplements . Functional ability and status . Nutritional status . Physical activity . Advanced directives . List of other physicians . Hospitalizations, surgeries, and ER visits in previous 12 months . Vitals . Screenings to include cognitive, depression, and falls . Referrals and appointments  In addition, I have reviewed and discussed with patient certain preventive protocols, quality metrics, and best practice recommendations. A written personalized care plan for preventive services as well as general preventive health recommendations were provided to patient.   Due to this being a telephonic visit, the after visit summary with patients personalized plan was offered to patient via mail or my-chart. Patient preferred to pick up at office at next visit.   Andrez Grime, LPN   10/16/[id number]

## 2020-02-10 NOTE — Patient Instructions (Signed)
[name] [name] , Thank you for taking time to come for your Medicare Wellness Visit. I appreciate your ongoing commitment to your health goals. Please review the following plan we discussed and let me know if I can assist you in the future.   Screening recommendations/referrals: Colonoscopy: Up to date, completed 05/30/2011, due 05/2021 Mammogram: due, Patient will schedule appointment Bone Density: Up to date, completed 07/21/2018, due in 2-5 years  Recommended yearly ophthalmology/optometry visit for glaucoma screening and checkup Recommended yearly dental visit for hygiene and checkup  Vaccinations: Influenza vaccine: due, will get at later date  Pneumococcal vaccine: Completed series Tdap vaccine: Up to date, completed 05/09/2011, due 04/2021 Shingles vaccine: due, check with your insurance company regarding coverage    Covid-19:Completed series  Advanced directives: Advance directive discussed with you today. Even though you declined this today please call our office should you change your mind and we can give you the proper paperwork for you to fill out.   Conditions/risks identified: Diabetes, hypertension  Next appointment: Follow up in one year for your annual wellness visit    Preventive Care 50 Years and Older, Female Preventive care refers to lifestyle choices and visits with your health care provider that can promote health and wellness. What does preventive care include?  A yearly physical exam. This is also called an annual well check.  Dental exams once or twice a year.  Routine eye exams. Ask your health care provider how often you should have your eyes checked.  Personal lifestyle choices, including:  Daily care of your teeth and gums.  Regular physical activity.  Eating a healthy diet.  Avoiding tobacco and drug use.  Limiting alcohol use.  Practicing safe sex.  Taking low-dose aspirin every day.  Taking vitamin and mineral supplements as recommended by  your health care provider. What happens during an annual well check? The services and screenings done by your health care provider during your annual well check will depend on your age, overall health, lifestyle risk factors, and family history of disease. Counseling  Your health care provider may ask you questions about your:  Alcohol use.  Tobacco use.  Drug use.  Emotional well-being.  Home and relationship well-being.  Sexual activity.  Eating habits.  History of falls.  Memory and ability to understand (cognition).  Work and work Statistician.  Reproductive health. Screening  You may have the following tests or measurements:  Height, weight, and BMI.  Blood pressure.  Lipid and cholesterol levels. These may be checked every 5 years, or more frequently if you are over 88 years old.  Skin check.  Lung cancer screening. You may have this screening every year starting at age 73 if you have a 30-pack-year history of smoking and currently smoke or have quit within the past 15 years.  Fecal occult blood test (FOBT) of the stool. You may have this test every year starting at age 32.  Flexible sigmoidoscopy or colonoscopy. You may have a sigmoidoscopy every 5 years or a colonoscopy every 10 years starting at age 34.  Hepatitis C blood test.  Hepatitis B blood test.  Sexually transmitted disease (STD) testing.  Diabetes screening. This is done by checking your blood sugar (glucose) after you have not eaten for a while (fasting). You may have this done every 1-3 years.  Bone density scan. This is done to screen for osteoporosis. You may have this done starting at age 67.  Mammogram. This may be done every 1-2 years. Talk to  your health care provider about how often you should have regular mammograms. Talk with your health care provider about your test results, treatment options, and if necessary, the need for more tests. Vaccines  Your health care provider may  recommend certain vaccines, such as:  Influenza vaccine. This is recommended every year.  Tetanus, diphtheria, and acellular pertussis (Tdap, Td) vaccine. You may need a Td booster every 10 years.  Zoster vaccine. You may need this after age 13.  Pneumococcal 13-valent conjugate (PCV13) vaccine. One dose is recommended after age 92.  Pneumococcal polysaccharide (PPSV23) vaccine. One dose is recommended after age 73. Talk to your health care provider about which screenings and vaccines you need and how often you need them. This information is not intended to replace advice given to you by your health care provider. Make sure you discuss any questions you have with your health care provider. Document Released: 06/24/2015 Document Revised: 02/15/2016 Document Reviewed: 03/29/2015 Elsevier Interactive Patient Education  2017 [place] Prevention in the Home Falls can cause injuries. They can happen to people of all ages. There are many things you can do to make your home safe and to help prevent falls. What can I do on the outside of my home?  Regularly fix the edges of walkways and driveways and fix any cracks.  Remove anything that might make you trip as you walk through a door, such as a raised step or threshold.  Trim any bushes or trees on the path to your home.  Use bright outdoor lighting.  Clear any walking paths of anything that might make someone trip, such as rocks or tools.  Regularly check to see if handrails are loose or broken. Make sure that both sides of any steps have handrails.  Any raised decks and porches should have guardrails on the edges.  Have any leaves, snow, or ice cleared regularly.  Use sand or salt on walking paths during winter.  Clean up any spills in your garage right away. This includes oil or grease spills. What can I do in the bathroom?  Use night lights.  Install grab bars by the toilet and in the tub and shower. Do not use towel  bars as grab bars.  Use non-skid mats or decals in the tub or shower.  If you need to sit down in the shower, use a plastic, non-slip stool.  Keep the floor dry. Clean up any water that spills on the floor as soon as it happens.  Remove soap buildup in the tub or shower regularly.  Attach bath mats securely with double-sided non-slip rug tape.  Do not have throw rugs and other things on the floor that can make you trip. What can I do in the bedroom?  Use night lights.  Make sure that you have a light by your bed that is easy to reach.  Do not use any sheets or blankets that are too big for your bed. They should not hang down onto the floor.  Have a firm chair that has side arms. You can use this for support while you get dressed.  Do not have throw rugs and other things on the floor that can make you trip. What can I do in the kitchen?  Clean up any spills right away.  Avoid walking on wet floors.  Keep items that you use a lot in easy-to-reach places.  If you need to reach something above you, use a strong step stool that has  a grab bar.  Keep electrical cords out of the way.  Do not use floor polish or wax that makes floors slippery. If you must use wax, use non-skid floor wax.  Do not have throw rugs and other things on the floor that can make you trip. What can I do with my stairs?  Do not leave any items on the stairs.  Make sure that there are handrails on both sides of the stairs and use them. Fix handrails that are broken or loose. Make sure that handrails are as long as the stairways.  Check any carpeting to make sure that it is firmly attached to the stairs. Fix any carpet that is loose or worn.  Avoid having throw rugs at the top or bottom of the stairs. If you do have throw rugs, attach them to the floor with carpet tape.  Make sure that you have a light switch at the top of the stairs and the bottom of the stairs. If you do not have them, ask someone to  add them for you. What else can I do to help prevent falls?  Wear shoes that:  Do not have high heels.  Have rubber bottoms.  Are comfortable and fit you well.  Are closed at the toe. Do not wear sandals.  If you use a stepladder:  Make sure that it is fully opened. Do not climb a closed stepladder.  Make sure that both sides of the stepladder are locked into place.  Ask someone to hold it for you, if possible.  Clearly mark and make sure that you can see:  Any grab bars or handrails.  First and last steps.  Where the edge of each step is.  Use tools that help you move around (mobility aids) if they are needed. These include:  Canes.  Walkers.  Scooters.  Crutches.  Turn on the lights when you go into a dark area. Replace any light bulbs as soon as they burn out.  Set up your furniture so you have a clear path. Avoid moving your furniture around.  If any of your floors are uneven, fix them.  If there are any pets around you, be aware of where they are.  Review your medicines with your doctor. Some medicines can make you feel dizzy. This can increase your chance of falling. Ask your doctor what other things that you can do to help prevent falls. This information is not intended to replace advice given to you by your health care provider. Make sure you discuss any questions you have with your health care provider. Document Released: 03/24/2009 Document Revised: 11/03/2015 Document Reviewed: 07/02/2014 Elsevier Interactive Patient Education  2017 [employer].

## 2020-02-10 NOTE — Progress Notes (Signed)
PCP notes:  Health Maintenance: Mammogram- due Foot exam- due Flu- due   Abnormal Screenings: none   Patient concerns: Elevated glucose levels Low abdominal pains sometimes   Nurse concerns: none   Next PCP appt.: 02/17/2020 @ 9:30 am

## 2020-02-13 ENCOUNTER — Other Ambulatory Visit: Payer: Self-pay | Admitting: Psychiatry

## 2020-02-16 NOTE — Telephone Encounter (Signed)
Please review

## 2020-02-17 ENCOUNTER — Encounter: Payer: Self-pay | Admitting: Family Medicine

## 2020-02-17 ENCOUNTER — Other Ambulatory Visit: Payer: Self-pay

## 2020-02-17 ENCOUNTER — Encounter: Payer: PPO | Admitting: Family Medicine

## 2020-02-17 ENCOUNTER — Ambulatory Visit (INDEPENDENT_AMBULATORY_CARE_PROVIDER_SITE_OTHER): Payer: PPO | Admitting: Family Medicine

## 2020-02-17 VITALS — BP 138/62 | HR 69 | Temp 97.7°F | Ht 63.0 in | Wt 187.5 lb

## 2020-02-17 DIAGNOSIS — Z23 Encounter for immunization: Secondary | ICD-10-CM | POA: Diagnosis not present

## 2020-02-17 DIAGNOSIS — E1169 Type 2 diabetes mellitus with other specified complication: Secondary | ICD-10-CM

## 2020-02-17 DIAGNOSIS — M5440 Lumbago with sciatica, unspecified side: Secondary | ICD-10-CM

## 2020-02-17 DIAGNOSIS — E669 Obesity, unspecified: Secondary | ICD-10-CM | POA: Diagnosis not present

## 2020-02-17 DIAGNOSIS — E6609 Other obesity due to excess calories: Secondary | ICD-10-CM | POA: Diagnosis not present

## 2020-02-17 DIAGNOSIS — Z6832 Body mass index (BMI) 32.0-32.9, adult: Secondary | ICD-10-CM

## 2020-02-17 DIAGNOSIS — Z0000 Encounter for general adult medical examination without abnormal findings: Secondary | ICD-10-CM

## 2020-02-17 DIAGNOSIS — G8929 Other chronic pain: Secondary | ICD-10-CM | POA: Diagnosis not present

## 2020-02-17 MED ORDER — BLOOD GLUCOSE MONITOR KIT: PACK | 0 refills | Status: AC

## 2020-02-17 NOTE — Patient Instructions (Signed)
Please follow up with your gynecologist for your mammogram  There is not one right eating plan for everyone.  It may take trial and error to find what will work for you.  It is important to get adequate protein and fiber with your meals.  It is okay to not eat breakfast or to skip meals if you are not hungry.  Avoid snacking between meals.  Unless you are on a fluid restriction, drink 80 to 90 ounces of water a day.  Suggested resources- [employer] carb www.adaptyourlifeacademy.com-there is a quiz to help you determine how many carbohydrates you should eat a day  www.thefastingmethod.com  If you have diabetes or access to a blood sugar machine, I recommend you check your blood sugar daily and keep a log.  Vary the time you check your blood sugar such as fasting, before meal, 2 hours after a meal and at bedtime.  Look for trends with the foods you are eating and be a scientist of your body.  Here are some guidelines to help you with meal planning -  Avoid all processed and packaged foods (bread, pasta, crackers, chips, etc) and beverages containing calories.  Avoid added sugars and excessive natural sugars.  Pay attention to how you feel if you consume artificial sweeteners.  Do they make you more hungry or raise your blood sugar?  With every meal and snack, aim to get 20 g of protein (3 ounces of meat, 4 ounces of fish, 3 eggs, protein powder, 1 cup [place] yogurt, 1 cup cottage cheese, etc.)  Increase fiber in the form of non-starchy vegetables.  These help you feel full with very little carbohydrates and are good for gut health.  Nonstarchy vegetables include summer squash, onions, peppers, tomatoes, eggplant, broccoli, cauliflower, cabbage, lettuce, spinach.  Have small amounts of good fats such as avocado, nuts, olive oil, nut butters, olives.  Add a little cheese to your meals to make them tasty.   Try to plan your meals for the week and do some meal preparation when able.  If  possible, make lunches for the week ahead of time.  Plan a couple of dinners and make enough so you can have leftovers.  Build in a treat once a week.

## 2020-02-17 NOTE — Progress Notes (Signed)
Subjective:    Patient ID: [name] [name], female    DOB: 10-19-1950, 69 y.o.   MRN: [id number]  HPI Chief Complaint  Patient presents with  . Medicare Wellness    part 2    This is a 69 yo who presents today for annual exam.   Last CPE- Mammo- 08/12/2018, GYN office Pap- mot applicable ULAGTXMIWOE-32/05/2481 Tdap-05/09/2011 Flu-annual Covid 19 vaccine-fully vaccinated Eye-within the last year Dental-regular Exercise-some walking Diet- interested in going to see dietician. Breakfast- high protein cereal, berries, whole wheat english muffin with peanut butter, Lunch- out, subs, burgers, occasional shrimp, Dinner- meat/ casserole, hot dogs, mac and cheese  Back- more problems, has seen chiropractor. Middle and down her back. Using ice, keeping moving. Pain with walking and sitting for prolonged periods. Has not been doing exercises.   Has pain in her left hand.  Seems to come and go.  Does not limit activities.  It is mellitus type II-has had problems recently with her glucometer.  She gets high readings on her machine and different readings on her husband's machine.  Review of Systems  Constitutional: Negative.   HENT: Negative.   Eyes: Negative.   Respiratory: Negative.   Cardiovascular: Negative.   Gastrointestinal: Negative.   Endocrine: Negative.   Genitourinary: Negative.   Musculoskeletal: Positive for back pain.  Hematological: Negative.        Objective:   Physical Exam Physical Exam  Constitutional: She is oriented to person, place, and time. She appears well-developed and well-nourished. No distress.  HENT:  Head: Normocephalic and atraumatic.  Right Ear: External ear normal. TM normal.  Left Ear: External ear normal. TM normal.  Nose: Nose normal.  Mouth/Throat: Oropharynx is clear and moist. No oropharyngeal exudate.  Eyes: Conjunctivae are normal.   Neck: Normal range of motion. Neck supple. No JVD present. No thyromegaly present.  Cardiovascular:  Normal rate, regular rhythm, normal heart sounds and intact distal pulses.   Pulmonary/Chest: Effort normal and breath sounds normal. Right breast exhibits no inverted nipple, no mass, no nipple discharge, no skin change and no tenderness. Left breast exhibits no inverted nipple, no mass, no nipple discharge, no skin change and no tenderness. Breasts are symmetrical.  Abdominal: Soft. Bowel sounds are normal. She exhibits no distension and no mass. There is no tenderness. There is no rebound and no guarding.   Musculoskeletal: Normal range of motion. She exhibits no edema or tenderness.  Lymphadenopathy:    She has no cervical adenopathy.  Neurological: She is alert and oriented to person, place, and time.   Skin: Skin is warm and dry. She is not diaphoretic.  Psychiatric: She has a normal mood and affect. Her behavior is normal. Judgment and thought content normal.  Vitals reviewed.    BP 138/62   Pulse 69   Temp 97.7 F (36.5 C) (Temporal)   Ht _0  (1.6 m)   Wt 187 lb 8 oz (85 kg)   SpO2 97%   BMI 33.21 kg/m  Wt Readings from Last 3 Encounters:  02/17/20 187 lb 8 oz (85 kg)  12/22/19 189 lb 4.8 oz (85.9 kg)  12/01/19 188 lb 12.8 oz (85.6 kg)   Diabetic Foot Exam - Simple   Simple Foot Form Diabetic Foot exam was performed with the following findings: Yes 02/17/2020 10:00 AM  Visual Inspection No deformities, no ulcerations, no other skin breakdown bilaterally: Yes See comments: Yes Sensation Testing Intact to touch and monofilament testing bilaterally: Yes Pulse Check Posterior Tibialis  and Dorsalis pulse intact bilaterally: Yes Comments Bilateral heel callus.         Assessment & Plan:  1. Annual physical exam - Discussed and encouraged healthy lifestyle choices- adequate sleep, regular exercise, stress management and healthy food choices.    2. Need for influenza vaccination - Flu Vaccine QUAD High Dose(Fluad)  3. Diabetes mellitus type 2 in obese  (HCC) -Hemoglobin A1c at goal at 6.6%, normal urine microalbumin -Patient interested in referral for diabetes education, referral placed - Ambulatory referral to diabetic education - blood glucose meter kit and supplies KIT; Dispense based on patient and insurance preference. Use up to four times daily as directed. (FOR ICD-9 250.00, 250.01).  Dispense: 1 each; Refill: 0  4. Class 1 obesity due to excess calories with serious comorbidity and body mass index (BMI) of 32.0 to 32.9 in adult -Discussed her current eating patterns and recommended that she decrease her simple carbohydrate intake, increase nonstarchy vegetables, lean proteins - Ambulatory referral to diabetic education  5. Chronic bilateral low back pain with sciatica, sciatica laterality unspecified -Discussed role of exercises and weight loss.  She will work on incorporating exercises and let me know if no improvement can consider referral to physical therapy  -Follow-up in 6 months  This visit occurred during the SARS-CoV-2 public health emergency.  Safety protocols were in place, including screening questions prior to the visit, additional usage of staff PPE, and extensive cleaning of exam room while observing appropriate contact time as indicated for disinfecting solutions.      Clarene Reamer, FNP-BC  [facility]  02/18/2020 9:35 PM

## 2020-02-18 ENCOUNTER — Encounter: Payer: Self-pay | Admitting: Family Medicine

## 2020-03-11 ENCOUNTER — Other Ambulatory Visit: Payer: Self-pay | Admitting: Family Medicine

## 2020-03-11 DIAGNOSIS — Z1231 Encounter for screening mammogram for malignant neoplasm of breast: Secondary | ICD-10-CM

## 2020-03-16 DIAGNOSIS — L820 Inflamed seborrheic keratosis: Secondary | ICD-10-CM | POA: Diagnosis not present

## 2020-03-17 ENCOUNTER — Encounter: Payer: PPO | Attending: Family Medicine | Admitting: Dietician

## 2020-03-17 ENCOUNTER — Encounter: Payer: Self-pay | Admitting: Dietician

## 2020-03-17 ENCOUNTER — Other Ambulatory Visit: Payer: Self-pay

## 2020-03-17 VITALS — Ht 63.0 in | Wt 184.7 lb

## 2020-03-17 DIAGNOSIS — E1169 Type 2 diabetes mellitus with other specified complication: Secondary | ICD-10-CM | POA: Insufficient documentation

## 2020-03-17 DIAGNOSIS — Z6832 Body mass index (BMI) 32.0-32.9, adult: Secondary | ICD-10-CM | POA: Diagnosis not present

## 2020-03-17 DIAGNOSIS — E669 Obesity, unspecified: Secondary | ICD-10-CM

## 2020-03-17 DIAGNOSIS — E6609 Other obesity due to excess calories: Secondary | ICD-10-CM | POA: Insufficient documentation

## 2020-03-17 DIAGNOSIS — E119 Type 2 diabetes mellitus without complications: Secondary | ICD-10-CM

## 2020-03-17 NOTE — Progress Notes (Signed)
Medical Nutrition Therapy: Visit start time: 0900  end time: 1030  Assessment:  Diagnosis: type 2 diabetes Past medical history: HTN, GERD, sleep apnea Psychosocial issues/ stress concerns: pt rates stress level as "moderate: and feels "ok" about stress meanagement   Preferred learning method:  . Auditory . Visual . Hands-on  Current weight: 184 lbs  Height: 5'3" Medications, supplements: reconciled in medical record  Labs- A1c of 6.6 on 02/10/2020  Progress and evaluation:   Pt states she has had diabetes for 'a while' and had diabetes education when she was first diagnosed but found the information hard to understand and implement   Pt states she is not familiar with what portion sizes she should be eating, which foods belong to which food groups, and   Pt reports checking her BG 1x/day; fasting BGs usually 150-180s, pt would like to get fasting BGs back to 130s   Pt states her husband also has diabetes and feels that 'he has a better mindset about it'  Pt reports she used to see a weight loss doctor, and struggled with following the guidelines provided   Pt states she currently dines out frequently and would like to learn how to make better choices at restaurants and also how to prepare quick, healthy meals and snacks at home   Physical activity: ADLs; pt states she has purchased a stationary bike that she would like to use but has not assembled yet   Dietary Intake:  Usual eating pattern includes 2-3 meals and 1-2 snacks per day. Dining out frequency: 10 meals per week.  Breakfast: [employer]- pancakes; Special K Protein with 2% milk and blackberries  Lunch: hoagies from [employer] with chips Snack: starbuck's grande iced chai latte, chips, cookies  Supper: tuna noodle casserole/meatloaf/spaghetti with meatballs or take out- hamburgers, fried shrimp or scallops with baked potato Snack: 3 schwan's icecream snacks/night (90 calories, total carbs 15g, 6g  added sugar each)  Beverages: water, unsweet tea with splenda, coffee with cream   Nutrition Care Education: Basic nutrition: basic food groups, appropriate nutrient balance, appropriate meal and snack schedule, general nutrition guidelines    Weight control: importance of low sugar and low fat choices, portion control strategies  Advanced nutrition: cooking techniques, dining out, food label reading Diabetes:  appropriate meal and snack schedule, appropriate carb intake and balance, healthy carb choices, role of fiber, protein, fat Hypertension: identifying high sodium foods, identifying food sources of potassium, magnesium Hyperlipidemia: healthy and unhealthy fats, role of fiber, plant sterols, role of exercise Other lifestyle changes:  benefits of making changes, increasing motivation, readiness for change  Nutritional Diagnosis:  NB-1.1 Food and nutrition-related knowledge deficit As related to type 2 diabetes.  As evidenced by pt report of lack of knowledge about how to read a nutrition label, carb counting, or how to categorize foods into the food groups.  Intervention:   Discussion and instruction as noted above.  Although pt A1c is considered well controlled for diabetes, pt was not confident in self management skills.  Pt stated her approach to managing carb intake was "all or nothing".  Pt seemed interested in making positive changes- expect positive outcomes. Established goals for additional change.    Education Materials given:  . General diet guidelines for Diabetes . Quick and balanced meals  . Food record . Plate Planner with food lists  . Snacking handout . Goals/ instructions  Learner/ who was taught:  . Patient   Level of understanding: Marland Kitchen Verbalizes/ demonstrates competency  Demonstrated degree of understanding via:   Teach back Learning barriers: . None  Willingness to learn/ readiness for change: . Eager, change in progress  Monitoring and Evaluation:   Dietary intake, exercise, BGs and A1c, and body weight      follow up: November 18 at 1:15p

## 2020-03-17 NOTE — Patient Instructions (Signed)
   Portion control carbs   2-3 servings/meal  1-2 servings/snack  Try to include more veggies at lunch and dinner   Drink more 2-3 bottles of water a day

## 2020-03-24 DIAGNOSIS — Z779 Other contact with and (suspected) exposures hazardous to health: Secondary | ICD-10-CM | POA: Diagnosis not present

## 2020-03-29 DIAGNOSIS — G4733 Obstructive sleep apnea (adult) (pediatric): Secondary | ICD-10-CM | POA: Diagnosis not present

## 2020-03-31 ENCOUNTER — Other Ambulatory Visit: Payer: Self-pay

## 2020-03-31 ENCOUNTER — Other Ambulatory Visit: Payer: Self-pay | Admitting: Family Medicine

## 2020-03-31 ENCOUNTER — Ambulatory Visit
Admission: RE | Admit: 2020-03-31 | Discharge: 2020-03-31 | Disposition: A | Discharge status: Home / Self Care | Payer: PPO | Source: Ambulatory Visit | Attending: Family Medicine | Admitting: Family Medicine

## 2020-03-31 ENCOUNTER — Encounter: Payer: Self-pay | Admitting: Family Medicine

## 2020-03-31 DIAGNOSIS — Z1231 Encounter for screening mammogram for malignant neoplasm of breast: Secondary | ICD-10-CM

## 2020-03-31 DIAGNOSIS — L247 Irritant contact dermatitis due to plants, except food: Secondary | ICD-10-CM

## 2020-03-31 MED ORDER — PREDNISONE 10 MG PO TABS: ORAL_TABLET | ORAL | 0 refills | Status: DC

## 2020-04-18 ENCOUNTER — Other Ambulatory Visit: Payer: Self-pay | Admitting: Cardiology

## 2020-04-18 ENCOUNTER — Other Ambulatory Visit: Payer: Self-pay | Admitting: Family Medicine

## 2020-04-18 DIAGNOSIS — E7800 Pure hypercholesterolemia, unspecified: Secondary | ICD-10-CM

## 2020-04-18 DIAGNOSIS — E1169 Type 2 diabetes mellitus with other specified complication: Secondary | ICD-10-CM

## 2020-04-18 DIAGNOSIS — E669 Obesity, unspecified: Secondary | ICD-10-CM

## 2020-04-19 NOTE — Telephone Encounter (Signed)
Pharmacy requests refill on: Metformin ER 750 mg  LAST REFILL: 01/18/2020 LAST OV: 02/17/2020 NEXT OV: 08/17/2020 PHARMACY: [id number] [place], [place]

## 2020-04-21 ENCOUNTER — Encounter: Payer: Self-pay | Admitting: Family Medicine

## 2020-04-22 ENCOUNTER — Other Ambulatory Visit: Payer: Self-pay | Admitting: Family Medicine

## 2020-04-22 DIAGNOSIS — J329 Chronic sinusitis, unspecified: Secondary | ICD-10-CM

## 2020-04-22 MED ORDER — FLUTICASONE PROPIONATE 50 MCG/ACT NA SUSP: 2.0000 | Freq: Every day | NASAL | 3 refills | Status: DC

## 2020-04-28 ENCOUNTER — Other Ambulatory Visit: Payer: Self-pay

## 2020-04-28 ENCOUNTER — Encounter: Payer: PPO | Attending: Family Medicine | Admitting: Dietician

## 2020-04-28 VITALS — Wt 182.6 lb

## 2020-04-28 DIAGNOSIS — E669 Obesity, unspecified: Secondary | ICD-10-CM | POA: Diagnosis not present

## 2020-04-28 DIAGNOSIS — Z6832 Body mass index (BMI) 32.0-32.9, adult: Secondary | ICD-10-CM | POA: Diagnosis not present

## 2020-04-28 DIAGNOSIS — E119 Type 2 diabetes mellitus without complications: Secondary | ICD-10-CM | POA: Diagnosis not present

## 2020-04-28 NOTE — Patient Instructions (Signed)
   Continue to work on water intake, 2-3 bottles a day   Start to meal prep one day a week   Lettuce, cucumbers, apples   Utilize freezer bags for extra portions of soups and casseroles

## 2020-04-28 NOTE — Progress Notes (Signed)
Medical Nutrition Therapy: Visit start time: 4097  end time: 1400  Assessment:  Diagnosis: type 2 diabetes  Medical history changes: none  Psychosocial issues/ stress concerns: none  Current weight: 182.6 lbs  Height: 5'3" Medications, supplement changes: none   Progress and evaluation:  . Pt reports prep and planning is challenging, and she would like to improve in this skill  . Pt has subscribed to shakeology protein powder mix; pt notes it is expensive and tastes 'ok' . Pt states she feels like she is more comfortable with portion control of carb-rich foods    Physical activity: ADLs  Dietary Intake:  Usual eating pattern includes 2-3 meals and 1-2 snacks per day. Dining out frequency: about the same   Breakfast: [employer]- pancakes; Special K Protein with 2% milk and blackberries  Lunch: Chik [employer] Grilled Chicken (apples, blueberries, strawberries, lettuce, blue cheese, granola and nuts) with apple cider vinaigrette; sandwiches   Snack: less cookies and chips Supper : [employer] (eggplant parm with less than half pasta) Snack: same as above  Beverages: water, unsweet tea with splenda, coffee with cream   Nutrition Care Education:    Weight control: reviewed progress since previous visit, determining reasonable weight loss rate, portion control strategies Advanced nutrition:  recipe modification, cooking techniques, dining out, food label reading Diabetes:  goals for BGs, appropriate meal and snack schedule, appropriate carb intake and balance Other lifestyle changes:  Meal planning and prepping   Nutritional Diagnosis:  NB-1.1 Food and nutrition-related knowledge deficit As related to type 2 diabetes.  As evidenced by pt discussion.  Intervention:  Discussion and instruction as noted above.  Discussed principles of meal planning and prepping and how to incorporate these techniques into pt's routine to help her meet her goals.  Pt has made progress with better  understanding of moderate carb intake throughtout the day is ideal for self management of BGs.  Reviewed goals for additional positive changes.   Education Materials given:  Marland Kitchen Carb-mindful smoothie handout  . Meal planning and prepping handout  . Quick and balanced meals handout  . Recipes- [email or web address], [email or web address], oldwayspt.org . Goals/ instructions  Learner/ who was taught:  . Patient   Level of understanding: Marland Kitchen Verbalizes/ demonstrates competency  Demonstrated degree of understanding via:   Teach back Learning barriers: . None  Willingness to learn/ readiness for change: . Eager, change in progress  Monitoring and Evaluation:  Dietary intake, exercise, BGs and A1c, and body weight      follow up: January 21 at 11am

## 2020-04-29 DIAGNOSIS — Z03818 Encounter for observation for suspected exposure to other biological agents ruled out: Secondary | ICD-10-CM | POA: Diagnosis not present

## 2020-04-29 DIAGNOSIS — Z1152 Encounter for screening for COVID-19: Secondary | ICD-10-CM | POA: Diagnosis not present

## 2020-05-03 ENCOUNTER — Encounter: Payer: Self-pay | Admitting: Family Medicine

## 2020-05-03 ENCOUNTER — Telehealth (INDEPENDENT_AMBULATORY_CARE_PROVIDER_SITE_OTHER): Payer: PPO | Admitting: Family Medicine

## 2020-05-03 DIAGNOSIS — J019 Acute sinusitis, unspecified: Secondary | ICD-10-CM | POA: Diagnosis not present

## 2020-05-03 MED ORDER — AMOXICILLIN-POT CLAVULANATE 875-125 MG PO TABS: 1.0000 | ORAL_TABLET | Freq: Two times a day (BID) | ORAL | 0 refills | Status: AC

## 2020-05-03 NOTE — Progress Notes (Signed)
Patient ID: [name] [name], female    DOB: November 09, 1950, 69 y.o.   MRN: [id number]  Virtual visit completed through [place], a video enabled telemedicine application. Due to national recommendations of social distancing due to COVID-19, a virtual visit is felt to be most appropriate for this patient at this time. Reviewed limitations, risks, security and privacy concerns of performing a virtual visit and the availability of in person appointments. I also reviewed that there may be a patient responsible charge related to this service. The patient agreed to proceed.   Patient location: home Provider location: [place] [facility], office Persons participating in this virtual visit: patient, provider   If any vitals were documented, they were collected by patient at home unless specified below.    BP (!) 155/89 (BP Location: Left Arm, Cuff Size: Large)   Pulse 69   Temp 99.5 F (37.5 C)   Ht '5\' 3"'  (1.6 m)   Wt 180 lb (81.6 kg)   BMI 31.89 kg/m   BP Readings from Last 3 Encounters:  05/03/20 (!) 155/89  02/17/20 138/62  12/22/19 128/76    CC: sinus congestion Subjective:   HPI: [name] [name] is a 69 y.o. female presenting on 05/03/2020 for Sinus Problem (C/o sore throat, cough, nasal congestion and drainage.  Concerned about getting bronchitis.  Recently treated for sinus sxs with Flonase, barely helpful.  Nasal congestion seems to have worsened and developed cough on 04/29/20.  Had PCR COVID test on 04/29/20, neg results. )   BP elevated today - not normally high - attributes to acute illness.  12 d h/o sinus congestion and sinus pressure headache, watery eyes, nasal congestion, PNdrainage, raw sore throat, loose cough that started last night. Some chills. H/o bronchitis in the past. Planning to be around family for the holidays.   No ear or tooth pain, fevers, dyspnea.   PCR COVID test negative 04/29/2020  She has been using her CPAP machine.  Has been treating symptoms  with flonase.  Ex smoker - quit 2014.  No h/o COPD.   [place] 08/2019, 09/2019 Received flu shot 02/2020.      Relevant past medical, surgical, family and social history reviewed and updated as indicated. Interim medical history since our last visit reviewed. Allergies and medications reviewed and updated. Outpatient Medications Prior to Visit  Medication Sig Dispense Refill  . albuterol (VENTOLIN HFA) 108 (90 Base) MCG/ACT inhaler Inhale 2 puffs into the lungs every 4 (four) hours as needed for wheezing or shortness of breath (cough, shortness of breath or wheezing.). 18 g 0  . aspirin 81 MG tablet Take 81 mg by mouth daily.      . blood glucose meter kit and supplies KIT Dispense based on patient and insurance preference. Use up to four times daily as directed. (FOR ICD-9 250.00, 250.01). 1 each 0  . buPROPion (WELLBUTRIN XL) 150 MG 24 hr tablet TAKE 3 TABLETS(450 MG) BY MOUTH EVERY MORNING 90 tablet 5  . Calcium Carbonate-Vitamin D (CALTRATE 600+D) 600-400 MG-UNIT per tablet Take 2 tablets 2 (two) times daily by mouth.     . carvedilol (COREG) 12.5 MG tablet Take 1.5 tablets (18.75 mg total) by mouth 2 (two) times daily. 270 tablet 3  . Cholecalciferol (VITAMIN D3) 3000 units TABS Take 1 tablet by mouth daily. 30 tablet   . cloNIDine (CATAPRES) 0.1 MG tablet Take 1 tablet (0.1 mg total) by mouth 2 (two) times daily. 180 tablet 1  .  fluticasone (FLONASE) 50 MCG/ACT nasal spray Place 2 sprays into both nostrils daily. 16 g 3  . glucose blood (ONETOUCH VERIO) test strip Use to check blood sugar 3 times a day 100 each 0  . hydrALAZINE (APRESOLINE) 50 MG tablet Take 1 tablet (50 mg total) by mouth in the morning and at bedtime. 180 tablet 3  . Lancets (ONETOUCH ULTRASOFT) lancets Use to check blood sugar 3 times a day. 100 each 1  . metFORMIN (GLUCOPHAGE-XR) 750 MG 24 hr tablet TAKE 1 TABLET(750 MG) BY MOUTH TWICE DAILY WITH A MEAL 180 tablet 1  . Multiple Vitamins-Minerals  (MULTIVITAMIN ADULTS 50+ PO) Take 1 tablet every morning by mouth.     . olmesartan-hydrochlorothiazide (BENICAR HCT) 40-12.5 MG tablet TAKE 1 TABLET BY MOUTH DAILY 90 tablet 3  . pantoprazole (PROTONIX) 40 MG tablet TAKE 1 TABLET(40 MG) BY MOUTH EVERY MORNING 90 tablet 1  . Probiotic Product (PROBIOTIC DAILY PO) Take 1 capsule every morning by mouth.     . rosuvastatin (CRESTOR) 20 MG tablet Take 1 tablet (20 mg total) by mouth daily. 90 tablet 3  . predniSONE (DELTASONE) 10 MG tablet Take 3 tablets x 3 days, 2 x 3 days, 1 x 3 days 18 tablet 0   No facility-administered medications prior to visit.     Per HPI unless specifically indicated in ROS section below Review of Systems Objective:  BP (!) 155/89 (BP Location: Left Arm, Cuff Size: Large)   Pulse 69   Temp 99.5 F (37.5 C)   Ht '5\' 3"'  (1.6 m)   Wt 180 lb (81.6 kg)   BMI 31.89 kg/m   Wt Readings from Last 3 Encounters:  05/03/20 180 lb (81.6 kg)  04/28/20 182 lb 9.6 oz (82.8 kg)  03/17/20 184 lb 11.2 oz (83.8 kg)       Physical exam: Gen: alert, NAD, hoarse and tired appearing Pulm: speaks in complete sentences without increased work of breathing Psych: normal mood, normal thought content      Assessment & Plan:   Problem List Items Addressed This Visit    Acute sinusitis    Anticipate bacterial given duration and progression of symptoms. Will cover with augmentin antibiotic.  Continue flonase, fluids and rest. Update if not improving as expected with treatment.  Not consistent with COVID although she has not yet had booster. Discussed reasonable to get tested again if desired given upcoming Thanksgiving plans.       Relevant Medications   amoxicillin-clavulanate (AUGMENTIN) 875-125 MG tablet       Meds ordered this encounter  Medications  . amoxicillin-clavulanate (AUGMENTIN) 875-125 MG tablet    Sig: Take 1 tablet by mouth 2 (two) times daily for 10 days.    Dispense:  20 tablet    Refill:  0   No orders  of the defined types were placed in this encounter.   I discussed the assessment and treatment plan with the patient. The patient was provided an opportunity to ask questions and all were answered. The patient agreed with the plan and demonstrated an understanding of the instructions. The patient was advised to call back or seek an in-person evaluation if the symptoms worsen or if the condition fails to improve as anticipated.  Follow up plan: Return if symptoms worsen or fail to improve.  Ria Bush, MD  Sinus congestion

## 2020-05-03 NOTE — Assessment & Plan Note (Signed)
Anticipate bacterial given duration and progression of symptoms. Will cover with augmentin antibiotic.  Continue flonase, fluids and rest. Update if not improving as expected with treatment.  Not consistent with COVID although she has not yet had booster. Discussed reasonable to get tested again if desired given upcoming Thanksgiving plans.

## 2020-05-17 ENCOUNTER — Other Ambulatory Visit: Payer: Self-pay

## 2020-05-17 ENCOUNTER — Ambulatory Visit (INDEPENDENT_AMBULATORY_CARE_PROVIDER_SITE_OTHER): Payer: PPO | Admitting: Psychiatry

## 2020-05-17 ENCOUNTER — Encounter: Payer: Self-pay | Admitting: Psychiatry

## 2020-05-17 DIAGNOSIS — F3342 Major depressive disorder, recurrent, in full remission: Secondary | ICD-10-CM

## 2020-05-17 DIAGNOSIS — F423 Hoarding disorder: Secondary | ICD-10-CM | POA: Diagnosis not present

## 2020-05-17 MED ORDER — BUPROPION HCL ER (XL) 300 MG PO TB24: 300.0000 mg | ORAL_TABLET | Freq: Every day | ORAL | 3 refills | Status: DC

## 2020-05-17 NOTE — Progress Notes (Signed)
[name] [name] [id number] 08-Dec-1950 69 y.o.  Subjective:   Patient ID:  [name] [name] is a 69 y.o. (DOB 03/05/1951) female.  Chief Complaint:  Chief Complaint  Patient presents with  . Follow-up    mood    Depression        Associated symptoms include no decreased concentration and no suicidal ideas.  [name] [name] presents to the office today for follow-up of depression and med change.   When seen December. Recommended NAC  And stop naltrexone to see if it was causing GI problems.  Rec counseling for hoarding but she didn't go.  seen June 2020 & 05/2019.  No meds were changed.  Only taking Wellbutrin XL 450 mg daily from here.  Following noted: Some therapy with  Windle Guard about hoarding but it never got followed up on it.  He doesn't respond to phone calls or emails.  Did accomplish some with hoarding bc no anxiety with it but he helped her get some of it done. Retirement date Mar 15, 2018 and used to it now.  All these years she's neglected her house but doesn't do anything about it.  Some hoarding.  Doesn't like to stay at home.  No where to put anything in the house bc the closets are full.  Can't get motivated to do anything about it.  Thinks it's motivation and uncertainty over what to do with things.  Won't let people visit bc she's embarrassed.  Doesn't like the house.  H won't move and won't help get rid of things.  05/17/20 appt with following noted: Still only on Wellbutrin 450 mg daily. No SE. Retired and [employer] keeping her busy, sitting 2-3 times per week.   Hard adjustment with retirement initially resolved.  Not depressed.  Patient reports stable mood and denies depressed or irritable moods.  Patient denies any recent difficulty with anxiety.  Patient denies difficulty with sleep initiation or maintenance. Denies appetite disturbance.  Patient reports that energy has been good.  Motivation to clean is not good. Patient denies any difficulty with concentration.  Patient  denies any suicidal ideation.   Past Psychiatric Medication Trials: Wellbutrin XL 4 5050+ Vraylar 1.5, Abilify 10, Xanax She has been on Wellbutrin from this practice since 2004 Naltrexone NAC GI  Review of Systems:  Review of Systems  Musculoskeletal: Positive for arthralgias and back pain.  Neurological: Negative for tremors and weakness.  Psychiatric/Behavioral: Positive for depression. Negative for agitation, behavioral problems, confusion, decreased concentration, dysphoric mood, hallucinations, self-injury, sleep disturbance and suicidal ideas. The patient is not nervous/anxious and is not hyperactive.     Medications: I have reviewed the patient's current medications.  Current Outpatient Medications  Medication Sig Dispense Refill  . albuterol (VENTOLIN HFA) 108 (90 Base) MCG/ACT inhaler Inhale 2 puffs into the lungs every 4 (four) hours as needed for wheezing or shortness of breath (cough, shortness of breath or wheezing.). 18 g 0  . aspirin 81 MG tablet Take 81 mg by mouth daily.      . blood glucose meter kit and supplies KIT Dispense based on patient and insurance preference. Use up to four times daily as directed. (FOR ICD-9 250.00, 250.01). 1 each 0  . buPROPion (WELLBUTRIN XL) 300 MG 24 hr tablet Take 1 tablet (300 mg total) by mouth daily. 90 tablet 3  . Calcium Carbonate-Vitamin D (CALTRATE 600+D) 600-400 MG-UNIT per tablet Take 2 tablets 2 (two) times daily by mouth.     . carvedilol (COREG)  12.5 MG tablet Take 1.5 tablets (18.75 mg total) by mouth 2 (two) times daily. 270 tablet 3  . Cholecalciferol (VITAMIN D3) 3000 units TABS Take 1 tablet by mouth daily. 30 tablet   . cloNIDine (CATAPRES) 0.1 MG tablet Take 1 tablet (0.1 mg total) by mouth 2 (two) times daily. 180 tablet 1  . fluticasone (FLONASE) 50 MCG/ACT nasal spray Place 2 sprays into both nostrils daily. 16 g 3  . glucose blood (ONETOUCH VERIO) test strip Use to check blood sugar 3 times a day 100 each 0  .  hydrALAZINE (APRESOLINE) 50 MG tablet Take 1 tablet (50 mg total) by mouth in the morning and at bedtime. 180 tablet 3  . Lancets (ONETOUCH ULTRASOFT) lancets Use to check blood sugar 3 times a day. 100 each 1  . metFORMIN (GLUCOPHAGE-XR) 750 MG 24 hr tablet TAKE 1 TABLET(750 MG) BY MOUTH TWICE DAILY WITH A MEAL 180 tablet 1  . Multiple Vitamins-Minerals (MULTIVITAMIN ADULTS 50+ PO) Take 1 tablet every morning by mouth.     . olmesartan-hydrochlorothiazide (BENICAR HCT) 40-12.5 MG tablet TAKE 1 TABLET BY MOUTH DAILY 90 tablet 3  . pantoprazole (PROTONIX) 40 MG tablet TAKE 1 TABLET(40 MG) BY MOUTH EVERY MORNING 90 tablet 1  . Probiotic Product (PROBIOTIC DAILY PO) Take 1 capsule every morning by mouth.     . rosuvastatin (CRESTOR) 20 MG tablet Take 1 tablet (20 mg total) by mouth daily. 90 tablet 3   No current facility-administered medications for this visit.    Medication Side Effects: None  Allergies: No Known Allergies  Past Medical History:  Diagnosis Date  . Acquired deformity of right foot   . CAD (coronary artery disease) primary cardioloigst-  dr Stanford Breed   hx NSTEMI 10-24-2001  s/p  cardiac cath w/ PCI and DES x1 to first diagonal  . Chronic constipation   . Depression   . Fatty liver 04/26/2015  . GERD (gastroesophageal reflux disease)   . History of abnormal cervical Pap smear    ACUS 2012  . History of exercise stress test 05-25-2014  dr Stanford Breed   borderline (indeterminate) with non-specific ST changes (mild ST depression in leads II,III, aVF, & V6, did not meet criteria for ischemia) ;  pt had low intolerence, no cp, normal heartrate and bp response, no arrhythmias  . History of goiter    as child s/p  removal ,  per pt benign , neck area  (not thyroid)  . History of non-ST elevation myocardial infarction (NSTEMI) 10/24/2001   s/p  PCI and DES to first diagonal  . Hyperlipidemia   . Hypertension   . OSA on CPAP pulmoloigst-  dr young   per study 11-07-2004  severe  osa  . Right foot pain   . S/P drug eluting coronary stent placement 10/27/2001   x1 to first diagonal   . Seasonal and perennial allergic rhinitis   . Type 2 diabetes mellitus ([place])   . Vitamin D deficiency   . Wears dentures    upper and lower partial denture  . Wears glasses     Family History  Problem Relation Age of Onset  . Cancer Mother        throat, cervical  . Diabetes Mother   . Stroke Mother   . Heart attack Mother   . Heart disease Mother   . Hypertension Mother   . Thyroid disease Mother   . Alcoholism Mother   . Cancer Father  prostate  . Diabetes Sister   . Heart attack Sister   . Hypertension Sister   . Hyperlipidemia Sister   . Colon cancer Neg Hx   . Breast cancer Neg Hx     Social History   Socioeconomic History  . Marital status: Married    Spouse name: Not on file  . Number of children: 3  . Years of education: Not on file  . Highest education level: Not on file  Occupational History  . 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 Smoking status: Former Smoker    Years: 46.00    Types: Cigarettes    Quit date: 09/23/2012    Years since quitting: 7.6  . Smokeless tobacco: Never Used  Vaping Use  . Vaping Use: Never used  Substance and Sexual Activity  . Alcohol use: Yes    Alcohol/week: 0.0 standard drinks    Comment: occasional wine  . Drug use: No  . Sexual activity: Not Currently  Other Topics Concern  . Not on file  Social History Narrative   Caffeine Use:  2 cups coffee daily   Regular exercise:  No      Social Determinants of Health   Financial Resource Strain: Low Risk   . Difficulty of Paying Living Expenses: Not hard at all  Food Insecurity: No Food Insecurity  . Worried About Charity fundraiser in the Last Year: Never true  . Ran Out of Food in the Last Year: Never true  Transportation Needs: No Transportation Needs  . Lack of  Transportation (Medical): No  . Lack of Transportation (Non-Medical): No  Physical Activity: Inactive  . Days of Exercise per Week: 0 days  . Minutes of Exercise per Session: 0 min  Stress: No Stress Concern Present  . Feeling of Stress : Not at all  Social Connections:   . Frequency of Communication with Friends and Family: Not on file  . Frequency of Social Gatherings with Friends and Family: Not on file  . Attends Religious Services: Not on file  . Active Member of Clubs or Organizations: Not on file  . Attends Archivist Meetings: Not on file  . Marital Status: Not on file  Intimate Partner Violence: Not At Risk  . Fear of Current or Ex-Partner: No  . Emotionally Abused: No  . Physically Abused: No  . Sexually Abused: No    Past Medical History, Surgical history, Social history, and Family history were reviewed and updated as appropriate.   Please see review of systems for further details on the patient's review from today.   Objective:   Physical Exam:  There were no vitals taken for this visit.  Physical Exam Constitutional:      General: She is not in acute distress.    Appearance: She is well-developed. She is obese.  Musculoskeletal:        General: No deformity.  Neurological:     Mental Status: She is alert and oriented to person, place, and time.     Motor: No tremor.     Coordination: Coordination normal.     Gait: Gait normal.  Psychiatric:        Attention and Perception: She is attentive.        Mood and Affect: Mood is not anxious or depressed. Affect is not labile, blunt, angry or inappropriate.        Speech:  Speech normal.        Behavior: Behavior normal.        Thought Content: Thought content normal. Thought content does not include homicidal or suicidal ideation. Thought content does not include homicidal or suicidal plan.        Judgment: Judgment normal.     Comments: Insight intact. No auditory or visual hallucinations. No  delusions.  Some hoarding causes distress.     Lab Review:     Component Value Date/Time   NA 141 02/10/2020 1034   NA 142 06/04/2019 1038   K 3.7 02/10/2020 1034   CL 103 02/10/2020 1034   CO2 32 02/10/2020 1034   GLUCOSE 122 (H) 02/10/2020 1034   BUN 15 02/10/2020 1034   BUN 16 06/04/2019 1038   CREATININE 0.74 02/10/2020 1034   CREATININE 0.70 12/15/2014 1949   CALCIUM 9.5 02/10/2020 1034   PROT 6.9 02/10/2020 1034   PROT 6.7 06/04/2019 1038   ALBUMIN 4.4 02/10/2020 1034   ALBUMIN 4.4 06/04/2019 1038   AST 19 02/10/2020 1034   ALT 24 02/10/2020 1034   ALKPHOS 51 02/10/2020 1034   BILITOT 0.5 02/10/2020 1034   BILITOT 0.6 06/04/2019 1038   GFRNONAA 80 06/04/2019 1038   GFRNONAA 80 10/27/2013 0910   GFRAA 93 06/04/2019 1038   GFRAA >89 10/27/2013 0910       Component Value Date/Time   WBC 4.4 02/10/2020 1034   RBC 4.36 02/10/2020 1034   HGB 12.4 02/10/2020 1034   HGB 13.3 09/05/2016 1202   HCT 37.0 02/10/2020 1034   HCT 42.1 09/05/2016 1202   PLT 157.0 02/10/2020 1034   MCV 84.9 02/10/2020 1034   MCV 89 09/05/2016 1202   MCH 28.0 09/05/2016 1202   MCH 29.2 05/09/2011 0931   MCHC 33.4 02/10/2020 1034   RDW 15.6 (H) 02/10/2020 1034   RDW 15.7 (H) 09/05/2016 1202   LYMPHSABS 1.6 02/10/2020 1034   LYMPHSABS 1.7 09/05/2016 1202   MONOABS 0.3 02/10/2020 1034   EOSABS 0.1 02/10/2020 1034   EOSABS 0.1 09/05/2016 1202   BASOSABS 0.0 02/10/2020 1034   BASOSABS 0.0 09/05/2016 1202    No results found for: POCLITH, LITHIUM   No results found for: PHENYTOIN, PHENOBARB, VALPROATE, CBMZ   .res Assessment: Plan:    Recurrent major depression in full remission ([place]) - Plan: buPROPion (WELLBUTRIN XL) 300 MG 24 hr tablet  Hoarding disorder with excessive acquisition   [name] [name]'s depression is controlled and she is satisfied with the medications, but wonders about decreasing it.  OK to reduce Welllbutrin to 300 mg daily. Disc risk relapse.  Problems solving around  hoarding.  Needs activity to help structure her time.  Then needs strategies to deal with the hoarding, problem solved around that issue.    Option counseling for hoarding and depresssion with retirement. [employer].  15-minute appointment  FU 12 mos.  Lynder Parents, MD, DFAPA  Please see After Visit Summary for patient specific instructions.  Future Appointments  Date Time Provider [place]  06/14/2020  9:00 AM Lelon Perla, MD [facility]  07/01/2020 11:00 AM Georgina Peer, RD [facility] None  08/17/2020 12:00 PM Elby Beck, FNP [facility]    No orders of the defined types were placed in this encounter.     -------------------------------

## 2020-05-18 ENCOUNTER — Ambulatory Visit: Payer: PPO | Admitting: Psychiatry

## 2020-05-28 ENCOUNTER — Other Ambulatory Visit: Payer: Self-pay | Admitting: Family Medicine

## 2020-05-29 ENCOUNTER — Encounter: Payer: Self-pay | Admitting: Family Medicine

## 2020-05-30 NOTE — Telephone Encounter (Signed)
Pharmacy requests refill on: Pantoprazole 40 mg    LAST REFILL: 10/13/2019 (Q-90, R-1) LAST OV: 05/03/2020 NEXT OV: 08/17/2020 PHARMACY: [id number] [place], [place]

## 2020-06-02 ENCOUNTER — Ambulatory Visit
Admission: RE | Admit: 2020-06-02 | Discharge: 2020-06-02 | Disposition: A | Discharge status: Home / Self Care | Payer: PPO | Source: Ambulatory Visit | Attending: Family Medicine | Admitting: Family Medicine

## 2020-06-02 ENCOUNTER — Other Ambulatory Visit: Payer: Self-pay

## 2020-06-02 VITALS — BP 127/76 | HR 75 | Temp 99.2°F | Resp 19 | Ht 63.0 in | Wt 179.0 lb

## 2020-06-02 DIAGNOSIS — R0981 Nasal congestion: Secondary | ICD-10-CM

## 2020-06-02 DIAGNOSIS — Z7689 Persons encountering health services in other specified circumstances: Secondary | ICD-10-CM | POA: Diagnosis not present

## 2020-06-02 DIAGNOSIS — J329 Chronic sinusitis, unspecified: Secondary | ICD-10-CM

## 2020-06-02 DIAGNOSIS — U071 COVID-19: Secondary | ICD-10-CM

## 2020-06-02 DIAGNOSIS — J3489 Other specified disorders of nose and nasal sinuses: Secondary | ICD-10-CM

## 2020-06-02 HISTORY — DX: COVID-19: U07.1

## 2020-06-02 MED ORDER — CETIRIZINE HCL 10 MG PO TABS: 10.0000 mg | ORAL_TABLET | Freq: Every day | ORAL | 0 refills | Status: DC

## 2020-06-02 MED ORDER — ALBUTEROL SULFATE HFA 108 (90 BASE) MCG/ACT IN AERS: 1.0000 | INHALATION_SPRAY | Freq: Four times a day (QID) | RESPIRATORY_TRACT | 0 refills | Status: DC | PRN

## 2020-06-02 MED ORDER — FLUTICASONE PROPIONATE 50 MCG/ACT NA SUSP: 2.0000 | Freq: Every day | NASAL | 3 refills | Status: DC

## 2020-06-02 NOTE — Discharge Instructions (Addendum)
Medicines as prescribed Follow up as needed for continued or worsening symptoms  

## 2020-06-02 NOTE — ED Provider Notes (Signed)
[name]    CSN: [id number] Arrival date & time: 06/02/20  1256      History   Chief Complaint Chief Complaint  Patient presents with  . Cough    HPI [name] [name] is a 69 y.o. female.   Patient is a 69 year old female presents today with approximately 2 days of nasal congestion, rhinorrhea, cough, itchy watery eyes.  Symptoms have been constant.  Denies any fever at home.  Does have a history of allergies and has been prescribed Flonase, albuterol as needed.  No chest pain, shortness of breath.     Past Medical History:  Diagnosis Date  . Acquired deformity of right foot   . CAD (coronary artery disease) primary cardioloigst-  dr Stanford Breed   hx NSTEMI 10-24-2001  s/p  cardiac cath w/ PCI and DES x1 to first diagonal  . Chronic constipation   . Depression   . Fatty liver 04/26/2015  . GERD (gastroesophageal reflux disease)   . History of abnormal cervical Pap smear    ACUS 2012  . History of exercise stress test 05-25-2014  dr Stanford Breed   borderline (indeterminate) with non-specific ST changes (mild ST depression in leads II,III, aVF, & V6, did not meet criteria for ischemia) ;  pt had low intolerence, no cp, normal heartrate and bp response, no arrhythmias  . History of goiter    as child s/p  removal ,  per pt benign , neck area  (not thyroid)  . History of non-ST elevation myocardial infarction (NSTEMI) 10/24/2001   s/p  PCI and DES to first diagonal  . Hyperlipidemia   . Hypertension   . OSA on CPAP pulmoloigst-  dr young   per study 11-07-2004  severe osa  . Right foot pain   . S/P drug eluting coronary stent placement 10/27/2001   x1 to first diagonal   . Seasonal and perennial allergic rhinitis   . Type 2 diabetes mellitus ([place])   . Vitamin D deficiency   . Wears dentures    upper and lower partial denture  . Wears glasses     Patient Active Problem List   Diagnosis Date Noted  . Acute sinusitis 05/03/2020  . Suspected COVID-19 virus  infection 06/18/2019  . Plant allergic contact dermatitis 11/04/2018  . Primary osteoarthritis of both first carpometacarpal joints 05/01/2018  . Class 2 obesity with serious comorbidity and body mass index (BMI) of 35.0 to 35.9 in adult 01/17/2017  . Other constipation 01/17/2017  . Essential hypertension 01/17/2017  . Type 2 diabetes mellitus without complication, with long-term current use of insulin ([place]) 12/20/2016  . Non-alcoholic fatty liver disease 12/20/2016  . Type 2 diabetes mellitus without complication, without long-term current use of insulin ([place]) 09/20/2016  . NAFLD (nonalcoholic fatty liver disease) 09/20/2016  . Vitamin D deficiency 09/20/2016  . Class 1 obesity without serious comorbidity with body mass index (BMI) of 34.0 to 34.9 in adult 09/05/2016  . Left carpal tunnel syndrome 08/18/2015  . Fatty liver 04/26/2015  . Onychomycosis 02/03/2014  . Urinary incontinence 10/29/2013  . Tinea pedis 07/08/2013  . Weight gain 09/28/2012  . Allergic rhinitis 09/28/2012  . Intertrigo 05/28/2012  . Microalbuminuria 11/29/2011  . Osteopenia 07/13/2011  . Internal and external hemorrhoids without complication 40/98/1191  . Benign neoplasm of colon 05/30/2011  . Preventative health care 05/09/2011  . Essential hypertension, benign 01/12/2009  . Diabetes mellitus type 2 in obese ([place]) 01/11/2007  . Hyperlipidemia 01/11/2007  . DEPRESSION 01/11/2007  .  OBSTRUCTIVE SLEEP APNEA 01/11/2007  . Coronary atherosclerosis 01/11/2007  . Seasonal and perennial allergic rhinitis 01/11/2007  . GERD 01/11/2007    Past Surgical History:  Procedure Laterality Date  . BUNIONECTOMY Left 2012  . BUNIONECTOMY Right 04/19/2017   Procedure: RIGHT BUNIONECTOMY;  Surgeon: Rosemary Holms, DPM;  Location: [facility];  Service: Podiatry;  Laterality: Right;  . CARDIAC CATHETERIZATION  09-11-2002   dr Lia Foyer   well-preserved LVF; continued patency previouly placed stent in diagonal  branch;  mild luminal irregularities   . CARDIOVASCULAR STRESS TEST  05-16-2011   dr Stanford Breed   normal nuclear study w/ no evidence ischemia/  normal LV function and wall motion , ef 77%  . CORONARY ANGIOPLASTY WITH STENT PLACEMENT  10-27-2001   dr Lia Foyer   high-grade stenosis first diagonal post PCI and DES (TAXUS);  mild luminal irregularity throughout LAD, RCA, and very mild CFx;  preserved LVF  . goiter removal  1968   per pt benign tumor removed from neck beside throat  . HALLUX VALGUS LAPIDUS Right 04/19/2017   Procedure: HALLUX VALGUS LAPIDUS FUSION;  Surgeon: Rosemary Holms, DPM;  Location: [place];  Service: Podiatry;  Laterality: Right;  . HAMMER TOE SURGERY Right 04/19/2017   Procedure: HAMMER TOE CORRECTION RIGHT 2ND;  Surgeon: Rosemary Holms, DPM;  Location: [facility];  Service: Podiatry;  Laterality: Right;  . METATARSAL OSTEOTOMY Right 04/19/2017   Procedure: 2ND METATARSAL OSTEOTOMY;  Surgeon: Rosemary Holms, DPM;  Location: [place];  Service: Podiatry;  Laterality: Right;  . TONSILLECTOMY AND ADENOIDECTOMY  age 59  . TUBAL LIGATION Bilateral 1985    OB History    Gravida  3   Para  3   Term  3   Preterm      AB      Living  3     SAB      IAB      Ectopic      Multiple      Live Births               Home Medications    Prior to Admission medications   Medication Sig Start Date End Date Taking? Authorizing Provider  albuterol (VENTOLIN HFA) 108 (90 Base) MCG/ACT inhaler Inhale 2 puffs into the lungs every 4 (four) hours as needed for wheezing or shortness of breath (cough, shortness of breath or wheezing.). 06/22/19   Elby Beck, FNP  albuterol (VENTOLIN HFA) 108 (90 Base) MCG/ACT inhaler Inhale 1-2 puffs into the lungs every 6 (six) hours as needed for wheezing or shortness of breath. 06/02/20   Loura Halt A, NP  aspirin 81 MG tablet Take 81 mg by mouth daily.    [provider]  blood glucose meter kit and supplies KIT Dispense based on patient and insurance preference. Use up to four times daily as directed. (FOR ICD-9 250.00, 250.01). 02/17/20   Elby Beck, FNP  buPROPion (WELLBUTRIN XL) 300 MG 24 hr tablet Take 1 tablet (300 mg total) by mouth daily. 05/17/20   Cottle, Billey [employer]., MD  Calcium Carbonate-Vitamin D 600-400 MG-UNIT tablet Take 2 tablets 2 (two) times daily by mouth.     [provider]  carvedilol (COREG) 12.5 MG tablet Take 1.5 tablets (18.75 mg total) by mouth 2 (two) times daily. 12/01/19   Almyra Deforest, PA  cetirizine (ZYRTEC) 10 MG tablet Take 1 tablet (10 mg total) by mouth daily. 06/02/20  Buster Schueller A, NP  Cholecalciferol (VITAMIN D3) 3000 units TABS Take 1 tablet by mouth daily. 06/26/17   Debbrah Alar, NP  cloNIDine (CATAPRES) 0.1 MG tablet Take 1 tablet (0.1 mg total) by mouth 2 (two) times daily. 10/27/19   Lelon Perla, MD  fluticasone (FLONASE) 50 MCG/ACT nasal spray Place 2 sprays into both nostrils daily. 06/02/20   Loura Halt A, NP  glucose blood (ONETOUCH VERIO) test strip Use to check blood sugar 3 times a day 01/19/20   Elby Beck, FNP  hydrALAZINE (APRESOLINE) 50 MG tablet Take 1 tablet (50 mg total) by mouth in the morning and at bedtime. 12/01/19   Almyra Deforest, PA  Lancets [facility] ULTRASOFT) lancets Use to check blood sugar 3 times a day. 01/13/20   Elby Beck, FNP  metFORMIN (GLUCOPHAGE-XR) 750 MG 24 hr tablet TAKE 1 TABLET(750 MG) BY MOUTH TWICE DAILY WITH A MEAL 04/19/20   Elby Beck, FNP  Multiple Vitamins-Minerals (MULTIVITAMIN ADULTS 50+ PO) Take 1 tablet every morning by mouth.     [provider]  olmesartan-hydrochlorothiazide (BENICAR HCT) 40-12.5 MG tablet TAKE 1 TABLET BY MOUTH DAILY 04/20/20   Lelon Perla, MD  pantoprazole (PROTONIX) 40 MG tablet TAKE 1 TABLET(40 MG) BY MOUTH EVERY MORNING 05/30/20   Elby Beck, FNP  Probiotic Product (PROBIOTIC DAILY PO)  Take 1 capsule every morning by mouth.     [provider]  rosuvastatin (CRESTOR) 20 MG tablet Take 1 tablet (20 mg total) by mouth daily. 10/27/19   Lelon Perla, MD    Family History Family History  Problem Relation Age of Onset  . Cancer Mother        throat, cervical  . Diabetes Mother   . Stroke Mother   . Heart attack Mother   . Heart disease Mother   . Hypertension Mother   . Thyroid disease Mother   . Alcoholism Mother   . Cancer Father        prostate  . Diabetes Sister   . Heart attack Sister   . Hypertension Sister   . Hyperlipidemia Sister   . Colon cancer Neg Hx   . Breast cancer Neg Hx     Social History Social History   Tobacco Use  . Smoking status: Former Smoker    Years: 46.00    Types: Cigarettes    Quit date: 09/23/2012    Years since quitting: 7.6  . Smokeless tobacco: Never Used  Vaping Use  . Vaping Use: Never used  Substance Use Topics  . Alcohol use: Yes    Alcohol/week: 0.0 standard drinks    Comment: occasional wine  . Drug use: No     Allergies   Patient has no known allergies.   Review of Systems Review of Systems   Physical Exam Triage Vital Signs ED Triage Vitals  Enc Vitals Group     BP 06/02/20 1305 127/76     Pulse Rate 06/02/20 1305 75     Resp 06/02/20 1305 19     Temp 06/02/20 1305 99.2 F (37.3 C)     Temp Source 06/02/20 1305 Oral     SpO2 06/02/20 1305 94 %     Weight 06/02/20 1303 179 lb (81.2 kg)     Height 06/02/20 1303 _0  (1.6 m)     Head Circumference --      Peak Flow --      Pain Score 06/02/20 1303 0  Pain Loc --      Pain Edu? --      Excl. in [place]? --    No data found.  Updated Vital Signs BP 127/76 (BP Location: Right Arm)   Pulse 75   Temp 99.2 F (37.3 C) (Oral)   Resp 19   Ht _0  (1.6 m)   Wt 179 lb (81.2 kg)   SpO2 94%   BMI 31.71 kg/m   Visual Acuity Right Eye Distance:   Left Eye Distance:   Bilateral Distance:    Right Eye Near:   Left Eye Near:     Bilateral Near:     Physical Exam Vitals and nursing note reviewed.  Constitutional:      General: She is not in acute distress.    Appearance: Normal appearance. She is not ill-appearing, toxic-appearing or diaphoretic.  HENT:     Head: Normocephalic.     Right Ear: Tympanic membrane and ear canal normal.     Left Ear: Tympanic membrane and ear canal normal.     Nose: Congestion and rhinorrhea present.     Mouth/Throat:     Pharynx: Oropharynx is clear.  Eyes:     Conjunctiva/sclera: Conjunctivae normal.     Comments: Watery eyes, clear   Cardiovascular:     Rate and Rhythm: Normal rate and regular rhythm.  Pulmonary:     Effort: Pulmonary effort is normal.     Breath sounds: Normal breath sounds.  Musculoskeletal:        General: Normal range of motion.     Cervical back: Normal range of motion.  Skin:    General: Skin is warm and dry.     Findings: No rash.  Neurological:     Mental Status: She is alert.  Psychiatric:        Mood and Affect: Mood normal.      UC Treatments / Results  Labs (all labs ordered are listed, but only abnormal results are displayed) Labs Reviewed  NOVEL CORONAVIRUS, NAA    EKG   Radiology No results found.  Procedures Procedures (including critical care time)  Medications Ordered in UC Medications - No data to display  Initial Impression / Assessment and Plan / UC Course  I have reviewed the triage vital signs and the nursing notes.  Pertinent labs & imaging results that were available during my care of the patient were reviewed by me and considered in my medical decision making (see chart for details).     Nasal congestion with rhinorrhea Symptoms are consistent with allergies versus something viral. No concerns on exam.  Will prescribe Flonase, Zyrtec and albuterol as needed. Covid swab pending Follow up as needed for continued or worsening symptoms  Final Clinical Impressions(s) / UC Diagnoses   Final diagnoses:   Nasal congestion with rhinorrhea     Discharge Instructions     Medicines as prescribed Follow up as needed for continued or worsening symptoms      ED Prescriptions    Medication Sig Dispense Auth. Provider   fluticasone (FLONASE) 50 MCG/ACT nasal spray Place 2 sprays into both nostrils daily. 16 g Kenzlee Fishburn A, NP   cetirizine (ZYRTEC) 10 MG tablet Take 1 tablet (10 mg total) by mouth daily. 30 tablet Zakiya Sporrer A, NP   albuterol (VENTOLIN HFA) 108 (90 Base) MCG/ACT inhaler Inhale 1-2 puffs into the lungs every 6 (six) hours as needed for wheezing or shortness of breath. 1 each Orvan July, NP  PDMP not reviewed this encounter.   Orvan July, NP 06/02/20 1345

## 2020-06-02 NOTE — ED Triage Notes (Signed)
Patient c/o nasal congestion and productive cough x 2 days.   Patient denies fever at home.  Patient is unaware of color of sputum production.    Patient hasn't used any medication's or alternative methods for symptoms.

## 2020-06-05 ENCOUNTER — Encounter: Payer: Self-pay | Admitting: Family Medicine

## 2020-06-05 LAB — SARS-COV-2, NAA 2 DAY TAT

## 2020-06-05 LAB — NOVEL CORONAVIRUS, NAA: SARS-CoV-2, NAA: DETECTED — AB

## 2020-06-07 NOTE — Progress Notes (Deleted)
HPI: FU coronary disease. She has had a prior PCI of her diagonal. Her most recent catheterization in April of 2004 showed no obstructive disease, and her ejection fraction was normal. Echocardiogram January 2020 showed normal LV function, small pericardial effusion.  Nuclear study January 2020 showed normal perfusion and ejection fraction 67%.  Abdominal ultrasound August 2020 showed no aneurysm.  Since I last saw her,  Current Outpatient Medications  Medication Sig Dispense Refill  . albuterol (VENTOLIN HFA) 108 (90 Base) MCG/ACT inhaler Inhale 2 puffs into the lungs every 4 (four) hours as needed for wheezing or shortness of breath (cough, shortness of breath or wheezing.). 18 g 0  . albuterol (VENTOLIN HFA) 108 (90 Base) MCG/ACT inhaler Inhale 1-2 puffs into the lungs every 6 (six) hours as needed for wheezing or shortness of breath. 1 each 0  . aspirin 81 MG tablet Take 81 mg by mouth daily.    . blood glucose meter kit and supplies KIT Dispense based on patient and insurance preference. Use up to four times daily as directed. (FOR ICD-9 250.00, 250.01). 1 each 0  . buPROPion (WELLBUTRIN XL) 300 MG 24 hr tablet Take 1 tablet (300 mg total) by mouth daily. 90 tablet 3  . Calcium Carbonate-Vitamin D 600-400 MG-UNIT tablet Take 2 tablets 2 (two) times daily by mouth.     . carvedilol (COREG) 12.5 MG tablet Take 1.5 tablets (18.75 mg total) by mouth 2 (two) times daily. 270 tablet 3  . cetirizine (ZYRTEC) 10 MG tablet Take 1 tablet (10 mg total) by mouth daily. 30 tablet 0  . Cholecalciferol (VITAMIN D3) 3000 units TABS Take 1 tablet by mouth daily. 30 tablet   . cloNIDine (CATAPRES) 0.1 MG tablet Take 1 tablet (0.1 mg total) by mouth 2 (two) times daily. 180 tablet 1  . fluticasone (FLONASE) 50 MCG/ACT nasal spray Place 2 sprays into both nostrils daily. 16 g 3  . glucose blood (ONETOUCH VERIO) test strip Use to check blood sugar 3 times a day 100 each 0  . hydrALAZINE (APRESOLINE) 50 MG  tablet Take 1 tablet (50 mg total) by mouth in the morning and at bedtime. 180 tablet 3  . Lancets (ONETOUCH ULTRASOFT) lancets Use to check blood sugar 3 times a day. 100 each 1  . metFORMIN (GLUCOPHAGE-XR) 750 MG 24 hr tablet TAKE 1 TABLET(750 MG) BY MOUTH TWICE DAILY WITH A MEAL 180 tablet 1  . Multiple Vitamins-Minerals (MULTIVITAMIN ADULTS 50+ PO) Take 1 tablet every morning by mouth.     . olmesartan-hydrochlorothiazide (BENICAR HCT) 40-12.5 MG tablet TAKE 1 TABLET BY MOUTH DAILY 90 tablet 3  . pantoprazole (PROTONIX) 40 MG tablet TAKE 1 TABLET(40 MG) BY MOUTH EVERY MORNING 90 tablet 1  . Probiotic Product (PROBIOTIC DAILY PO) Take 1 capsule every morning by mouth.     . rosuvastatin (CRESTOR) 20 MG tablet Take 1 tablet (20 mg total) by mouth daily. 90 tablet 3   No current facility-administered medications for this visit.     Past Medical History:  Diagnosis Date  . Acquired deformity of right foot   . CAD (coronary artery disease) primary cardioloigst-  dr Stanford Breed   hx NSTEMI 10-24-2001  s/p  cardiac cath w/ PCI and DES x1 to first diagonal  . Chronic constipation   . Depression   . Fatty liver 04/26/2015  . GERD (gastroesophageal reflux disease)   . History of abnormal cervical Pap smear    ACUS 2012  .  History of exercise stress test 05-25-2014  dr Stanford Breed   borderline (indeterminate) with non-specific ST changes (mild ST depression in leads II,III, aVF, & V6, did not meet criteria for ischemia) ;  pt had low intolerence, no cp, normal heartrate and bp response, no arrhythmias  . History of goiter    as child s/p  removal ,  per pt benign , neck area  (not thyroid)  . History of non-ST elevation myocardial infarction (NSTEMI) 10/24/2001   s/p  PCI and DES to first diagonal  . Hyperlipidemia   . Hypertension   . OSA on CPAP pulmoloigst-  dr young   per study 11-07-2004  severe osa  . Right foot pain   . S/P drug eluting coronary stent placement 10/27/2001   x1 to first  diagonal   . Seasonal and perennial allergic rhinitis   . Type 2 diabetes mellitus ([place])   . Vitamin D deficiency   . Wears dentures    upper and lower partial denture  . Wears glasses     Past Surgical History:  Procedure Laterality Date  . BUNIONECTOMY Left 2012  . BUNIONECTOMY Right 04/19/2017   Procedure: RIGHT BUNIONECTOMY;  Surgeon: Rosemary Holms, DPM;  Location: [facility];  Service: Podiatry;  Laterality: Right;  . CARDIAC CATHETERIZATION  09-11-2002   dr Lia Foyer   well-preserved LVF; continued patency previouly placed stent in diagonal branch;  mild luminal irregularities   . CARDIOVASCULAR STRESS TEST  05-16-2011   dr Stanford Breed   normal nuclear study w/ no evidence ischemia/  normal LV function and wall motion , ef 77%  . CORONARY ANGIOPLASTY WITH STENT PLACEMENT  10-27-2001   dr Lia Foyer   high-grade stenosis first diagonal post PCI and DES (TAXUS);  mild luminal irregularity throughout LAD, RCA, and very mild CFx;  preserved LVF  . goiter removal  1968   per pt benign tumor removed from neck beside throat  . HALLUX VALGUS LAPIDUS Right 04/19/2017   Procedure: HALLUX VALGUS LAPIDUS FUSION;  Surgeon: Rosemary Holms, DPM;  Location: [place];  Service: Podiatry;  Laterality: Right;  . HAMMER TOE SURGERY Right 04/19/2017   Procedure: HAMMER TOE CORRECTION RIGHT 2ND;  Surgeon: Rosemary Holms, DPM;  Location[facility];  Service: Podiatry;  Laterality: Right;  . METATARSAL OSTEOTOMY Right 04/19/2017   Procedure: 2ND METATARSAL OSTEOTOMY;  Surgeon: Rosemary Holms, DPM;  Location: [place];  Service: Podiatry;  Laterality: Right;  . TONSILLECTOMY AND ADENOIDECTOMY  age 1  . TUBAL LIGATION Bilateral 1985    Social History   Socioeconomic History  . Marital status: Married    Spouse name: Not on file  . Number of children: 3  . Years of education: Not on file  . Highest education level: Not on file   Occupational History  . 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 Smoking status: Former Smoker    Years: 46.00    Types: Cigarettes    Quit date: 09/23/2012    Years since quitting: 7.7  . Smokeless tobacco: Never Used  Vaping Use  . Vaping Use: Never used  Substance and Sexual Activity  . Alcohol use: Yes    Alcohol/week: 0.0 standard drinks    Comment: occasional wine  . Drug use: No  . Sexual activity: Not Currently  Other Topics Concern  . Not on file  Social History Narrative  Caffeine Use:  2 cups coffee daily   Regular exercise:  No      Social Determinants of Health   Financial Resource Strain: Low Risk   . Difficulty of Paying Living Expenses: Not hard at all  Food Insecurity: No Food Insecurity  . Worried About Charity fundraiser in the Last Year: Never true  . Ran Out of Food in the Last Year: Never true  Transportation Needs: No Transportation Needs  . Lack of Transportation (Medical): No  . Lack of Transportation (Non-Medical): No  Physical Activity: Inactive  . Days of Exercise per Week: 0 days  . Minutes of Exercise per Session: 0 min  Stress: No Stress Concern Present  . Feeling of Stress : Not at all  Social Connections: Not on file  Intimate Partner Violence: Not At Risk  . Fear of Current or Ex-Partner: No  . Emotionally Abused: No  . Physically Abused: No  . Sexually Abused: No    Family History  Problem Relation Age of Onset  . Cancer Mother        throat, cervical  . Diabetes Mother   . Stroke Mother   . Heart attack Mother   . Heart disease Mother   . Hypertension Mother   . Thyroid disease Mother   . Alcoholism Mother   . Cancer Father        prostate  . Diabetes Sister   . Heart attack Sister   . Hypertension Sister   . Hyperlipidemia Sister   . Colon cancer Neg Hx   . Breast cancer Neg Hx     ROS: no fevers or chills, productive cough,  hemoptysis, dysphasia, odynophagia, melena, hematochezia, dysuria, hematuria, rash, seizure activity, orthopnea, PND, pedal edema, claudication. Remaining systems are negative.  Physical Exam: Well-developed well-nourished in no acute distress.  Skin is warm and dry.  HEENT is normal.  Neck is supple.  Chest is clear to auscultation with normal expansion.  Cardiovascular exam is regular rate and rhythm.  Abdominal exam nontender or distended. No masses palpated. Extremities show no edema. neuro grossly intact  ECG- personally reviewed  A/P  1 coronary artery disease-patient denies chest pain.  Continue medical therapy with aspirin and statin.  2 hypertension-patient's blood pressure is controlled.  Continue present medications and follow.  3 hyperlipidemia-continue statin.  4 history of lower extremity edema-improved off of amlodipine.  We will also continue diuretic at present dose.  Kirk Ruths, MD

## 2020-06-14 ENCOUNTER — Ambulatory Visit: Payer: PPO | Admitting: Cardiology

## 2020-06-17 ENCOUNTER — Ambulatory Visit
Admission: EM | Admit: 2020-06-17 | Discharge: 2020-06-17 | Disposition: A | Discharge status: Home / Self Care | Payer: PPO | Attending: Family Medicine | Admitting: Family Medicine

## 2020-06-17 ENCOUNTER — Other Ambulatory Visit: Payer: Self-pay

## 2020-06-17 DIAGNOSIS — J0110 Acute frontal sinusitis, unspecified: Secondary | ICD-10-CM

## 2020-06-17 MED ORDER — AMOXICILLIN-POT CLAVULANATE 875-125 MG PO TABS: 1.0000 | ORAL_TABLET | Freq: Two times a day (BID) | ORAL | 0 refills | Status: DC

## 2020-06-17 NOTE — ED Provider Notes (Signed)
[name] [name]    CSN: [id number] Arrival date & time: 06/17/20  1024      History   Chief Complaint Chief Complaint  Patient presents with  . Sinus Problem  . Nasal Congestion  . Wheezing    HPI [name] [name] is a 70 y.o. female.   Patient is a 42-year-old female presents today for possible sinus infection. She has had nasal congestion, headache, drainage for the past couple weeks. Was seen here couple weeks ago and tested positive for Covid. Treating symptoms with Tylenol, cough medication, Flonase. No fevers, chills. No chest pain or shortness of breath     Past Medical History:  Diagnosis Date  . Acquired deformity of right foot   . CAD (coronary artery disease) primary cardioloigst-  dr Stanford Breed   hx NSTEMI 10-24-2001  s/p  cardiac cath w/ PCI and DES x1 to first diagonal  . Chronic constipation   . Depression   . Fatty liver 04/26/2015  . GERD (gastroesophageal reflux disease)   . History of abnormal cervical Pap smear    ACUS 2012  . History of exercise stress test 05-25-2014  dr Stanford Breed   borderline (indeterminate) with non-specific ST changes (mild ST depression in leads II,III, aVF, & V6, did not meet criteria for ischemia) ;  pt had low intolerence, no cp, normal heartrate and bp response, no arrhythmias  . History of goiter    as child s/p  removal ,  per pt benign , neck area  (not thyroid)  . History of non-ST elevation myocardial infarction (NSTEMI) 10/24/2001   s/p  PCI and DES to first diagonal  . Hyperlipidemia   . Hypertension   . OSA on CPAP pulmoloigst-  dr young   per study 11-07-2004  severe osa  . Right foot pain   . S/P drug eluting coronary stent placement 10/27/2001   x1 to first diagonal   . Seasonal and perennial allergic rhinitis   . Type 2 diabetes mellitus ([place])   . Vitamin D deficiency   . Wears dentures    upper and lower partial denture  . Wears glasses     Patient Active Problem List   Diagnosis Date Noted  .  Acute sinusitis 05/03/2020  . Suspected COVID-19 virus infection 06/18/2019  . Plant allergic contact dermatitis 11/04/2018  . Primary osteoarthritis of both first carpometacarpal joints 05/01/2018  . Class 2 obesity with serious comorbidity and body mass index (BMI) of 35.0 to 35.9 in adult 01/17/2017  . Other constipation 01/17/2017  . Essential hypertension 01/17/2017  . Type 2 diabetes mellitus without complication, with long-term current use of insulin ([place]) 12/20/2016  . Non-alcoholic fatty liver disease 12/20/2016  . Type 2 diabetes mellitus without complication, without long-term current use of insulin ([place]) 09/20/2016  . NAFLD (nonalcoholic fatty liver disease) 09/20/2016  . Vitamin D deficiency 09/20/2016  . Class 1 obesity without serious comorbidity with body mass index (BMI) of 34.0 to 34.9 in adult 09/05/2016  . Left carpal tunnel syndrome 08/18/2015  . Fatty liver 04/26/2015  . Onychomycosis 02/03/2014  . Urinary incontinence 10/29/2013  . Tinea pedis 07/08/2013  . Weight gain 09/28/2012  . Allergic rhinitis 09/28/2012  . Intertrigo 05/28/2012  . Microalbuminuria 11/29/2011  . Osteopenia 07/13/2011  . Internal and external hemorrhoids without complication 23/55/7322  . Benign neoplasm of colon 05/30/2011  . Preventative health care 05/09/2011  . Essential hypertension, benign 01/12/2009  . Diabetes mellitus type 2 in obese ([place]) 01/11/2007  .  Hyperlipidemia 01/11/2007  . DEPRESSION 01/11/2007  . OBSTRUCTIVE SLEEP APNEA 01/11/2007  . Coronary atherosclerosis 01/11/2007  . Seasonal and perennial allergic rhinitis 01/11/2007  . GERD 01/11/2007    Past Surgical History:  Procedure Laterality Date  . BUNIONECTOMY Left 2012  . BUNIONECTOMY Right 04/19/2017   Procedure: RIGHT BUNIONECTOMY;  Surgeon: Rosemary Holms, DPM;  Location: [facility];  Service: Podiatry;  Laterality: Right;  . CARDIAC CATHETERIZATION  09-11-2002   dr Lia Foyer   well-preserved  LVF; continued patency previouly placed stent in diagonal branch;  mild luminal irregularities   . CARDIOVASCULAR STRESS TEST  05-16-2011   dr Stanford Breed   normal nuclear study w/ no evidence ischemia/  normal LV function and wall motion , ef 77%  . CORONARY ANGIOPLASTY WITH STENT PLACEMENT  10-27-2001   dr Lia Foyer   high-grade stenosis first diagonal post PCI and DES (TAXUS);  mild luminal irregularity throughout LAD, RCA, and very mild CFx;  preserved LVF  . goiter removal  1968   per pt benign tumor removed from neck beside throat  . HALLUX VALGUS LAPIDUS Right 04/19/2017   Procedure: HALLUX VALGUS LAPIDUS FUSION;  Surgeon: Rosemary Holms, DPM;  Location: [place];  Service: Podiatry;  Laterality: Right;  . HAMMER TOE SURGERY Right 04/19/2017   Procedure: HAMMER TOE CORRECTION RIGHT 2ND;  Surgeon: Rosemary Holms, DPM;  Location[facility];  Service: Podiatry;  Laterality: Right;  . METATARSAL OSTEOTOMY Right 04/19/2017   Procedure: 2ND METATARSAL OSTEOTOMY;  Surgeon: Rosemary Holms, DPM;  Location: [place];  Service: Podiatry;  Laterality: Right;  . TONSILLECTOMY AND ADENOIDECTOMY  age 25  . TUBAL LIGATION Bilateral 1985    OB History    Gravida  3   Para  3   Term  3   Preterm      AB      Living  3     SAB      IAB      Ectopic      Multiple      Live Births               Home Medications    Prior to Admission medications   Medication Sig Start Date End Date Taking? Authorizing Provider  amoxicillin-clavulanate (AUGMENTIN) 875-125 MG tablet Take 1 tablet by mouth every 12 (twelve) hours. 06/17/20  Yes Glanda Spanbauer A, NP  albuterol (VENTOLIN HFA) 108 (90 Base) MCG/ACT inhaler Inhale 2 puffs into the lungs every 4 (four) hours as needed for wheezing or shortness of breath (cough, shortness of breath or wheezing.). 06/22/19   Elby Beck, FNP  albuterol (VENTOLIN HFA) 108 (90 Base) MCG/ACT inhaler Inhale 1-2  puffs into the lungs every 6 (six) hours as needed for wheezing or shortness of breath. 06/02/20   Loura Halt A, NP  aspirin 81 MG tablet Take 81 mg by mouth daily.    [provider]  blood glucose meter kit and supplies KIT Dispense based on patient and insurance preference. Use up to four times daily as directed. (FOR ICD-9 250.00, 250.01). 02/17/20   Elby Beck, FNP  buPROPion (WELLBUTRIN XL) 300 MG 24 hr tablet Take 1 tablet (300 mg total) by mouth daily. 05/17/20   Cottle, Billey Co., MD  Calcium Carbonate-Vitamin D 600-400 MG-UNIT tablet Take 2 tablets 2 (two) times daily by mouth.     [provider]  carvedilol (COREG) 12.5 MG tablet Take 1.5 tablets (18.75 mg  total) by mouth 2 (two) times daily. 12/01/19   Almyra Deforest, PA  cetirizine (ZYRTEC) 10 MG tablet Take 1 tablet (10 mg total) by mouth daily. 06/02/20   Loura Halt A, NP  Cholecalciferol (VITAMIN D3) 3000 units TABS Take 1 tablet by mouth daily. 06/26/17   Debbrah Alar, NP  cloNIDine (CATAPRES) 0.1 MG tablet Take 1 tablet (0.1 mg total) by mouth 2 (two) times daily. 10/27/19   Lelon Perla, MD  fluticasone (FLONASE) 50 MCG/ACT nasal spray Place 2 sprays into both nostrils daily. 06/02/20   Loura Halt A, NP  glucose blood (ONETOUCH VERIO) test strip Use to check blood sugar 3 times a day 01/19/20   Elby Beck, FNP  hydrALAZINE (APRESOLINE) 50 MG tablet Take 1 tablet (50 mg total) by mouth in the morning and at bedtime. 12/01/19   Almyra Deforest, PA  Lancets [facility] ULTRASOFT) lancets Use to check blood sugar 3 times a day. 01/13/20   Elby Beck, FNP  metFORMIN (GLUCOPHAGE-XR) 750 MG 24 hr tablet TAKE 1 TABLET(750 MG) BY MOUTH TWICE DAILY WITH A MEAL 04/19/20   Elby Beck, FNP  Multiple Vitamins-Minerals (MULTIVITAMIN ADULTS 50+ PO) Take 1 tablet every morning by mouth.     [provider]  olmesartan-hydrochlorothiazide (BENICAR HCT) 40-12.5 MG tablet TAKE 1 TABLET BY MOUTH DAILY  04/20/20   Lelon Perla, MD  pantoprazole (PROTONIX) 40 MG tablet TAKE 1 TABLET(40 MG) BY MOUTH EVERY MORNING 05/30/20   Elby Beck, FNP  Probiotic Product (PROBIOTIC DAILY PO) Take 1 capsule every morning by mouth.     [provider]  rosuvastatin (CRESTOR) 20 MG tablet Take 1 tablet (20 mg total) by mouth daily. 10/27/19   Lelon Perla, MD    Family History Family History  Problem Relation Age of Onset  . Cancer Mother        throat, cervical  . Diabetes Mother   . Stroke Mother   . Heart attack Mother   . Heart disease Mother   . Hypertension Mother   . Thyroid disease Mother   . Alcoholism Mother   . Cancer Father        prostate  . Diabetes Sister   . Heart attack Sister   . Hypertension Sister   . Hyperlipidemia Sister   . Colon cancer Neg Hx   . Breast cancer Neg Hx     Social History Social History   Tobacco Use  . Smoking status: Former Smoker    Years: 46.00    Types: Cigarettes    Quit date: 09/23/2012    Years since quitting: 7.7  . Smokeless tobacco: Never Used  Vaping Use  . Vaping Use: Never used  Substance Use Topics  . Alcohol use: Yes    Alcohol/week: 0.0 standard drinks    Comment: occasional wine  . Drug use: No     Allergies   Patient has no known allergies.   Review of Systems Review of Systems   Physical Exam Triage Vital Signs ED Triage Vitals  Enc Vitals Group     BP 06/17/20 1042 (S) (!) 180/76     Pulse Rate 06/17/20 1042 64     Resp 06/17/20 1042 16     Temp 06/17/20 1042 (!) 97.5 F (36.4 C)     Temp src --      SpO2 06/17/20 1042 97 %     Weight 06/17/20 1038 177 lb (80.3 kg)     Height --  Head Circumference --      Peak Flow --      Pain Score 06/17/20 1038 0     Pain Loc --      Pain Edu? --      Excl. in [place]? --    No data found.  Updated Vital Signs BP (S) (!) 180/76 (BP Location: Left Arm) Comment: Provider notifed.  Pulse 64   Temp (!) 97.5 F (36.4 C)   Resp 16   Wt  177 lb (80.3 kg)   SpO2 97%   BMI 31.35 kg/m   Visual Acuity Right Eye Distance:   Left Eye Distance:   Bilateral Distance:    Right Eye Near:   Left Eye Near:    Bilateral Near:     Physical Exam Vitals and nursing note reviewed.  Constitutional:      General: She is not in acute distress.    Appearance: Normal appearance. She is not ill-appearing, toxic-appearing or diaphoretic.  HENT:     Head: Normocephalic.     Nose: Congestion present.     Mouth/Throat:     Pharynx: Oropharynx is clear.  Eyes:     Conjunctiva/sclera: Conjunctivae normal.  Pulmonary:     Effort: Pulmonary effort is normal.  Musculoskeletal:        General: Normal range of motion.     Cervical back: Normal range of motion.  Skin:    General: Skin is warm and dry.     Findings: No rash.  Neurological:     Mental Status: She is alert.  Psychiatric:        Mood and Affect: Mood normal.      UC Treatments / Results  Labs (all labs ordered are listed, but only abnormal results are displayed) Labs Reviewed - No data to display  EKG   Radiology No results found.  Procedures Procedures (including critical care time)  Medications Ordered in UC Medications - No data to display  Initial Impression / Assessment and Plan / UC Course  I have reviewed the triage vital signs and the nursing notes.  Pertinent labs & imaging results that were available during my care of the patient were reviewed by me and considered in my medical decision making (see chart for details).     Sinusitis Patient with approximate 3 weeks or more of symptoms not improving. We'll go ahead and treat with antibiotics at this time. Continue medications as needed for symptoms Follow up as needed for continued or worsening symptoms  Final Clinical Impressions(s) / UC Diagnoses   Final diagnoses:  Acute non-recurrent frontal sinusitis     Discharge Instructions     Take the antibiotics as prescribed You can  continue the medicines for symptoms  Follow up as needed for continued or worsening symptoms     ED Prescriptions    Medication Sig Dispense Auth. Provider   amoxicillin-clavulanate (AUGMENTIN) 875-125 MG tablet Take 1 tablet by mouth every 12 (twelve) hours. 14 tablet Aldine Chakraborty A, NP     PDMP not reviewed this encounter.   Loura Halt A, NP 06/17/20 1056

## 2020-06-17 NOTE — ED Triage Notes (Signed)
Patient c/o of possible sinus infection. She has a HA, nasal congestion x 3 weeks. Treating headache with Tylenol and cough med. Tested positive for covid 12/23.   Denies fever, n/v, diarrhea.

## 2020-06-17 NOTE — Discharge Instructions (Addendum)
Take the antibiotics as prescribed You can continue the medicines for symptoms  Follow up as needed for continued or worsening symptoms

## 2020-07-01 ENCOUNTER — Ambulatory Visit: Payer: PPO | Admitting: Dietician

## 2020-07-01 MED ORDER — HYDRALAZINE HCL 100 MG PO TABS: 100.0000 mg | ORAL_TABLET | Freq: Two times a day (BID) | ORAL | 1 refills | Status: DC

## 2020-07-06 DIAGNOSIS — G4733 Obstructive sleep apnea (adult) (pediatric): Secondary | ICD-10-CM | POA: Diagnosis not present

## 2020-07-21 ENCOUNTER — Other Ambulatory Visit: Payer: Self-pay

## 2020-07-22 ENCOUNTER — Other Ambulatory Visit: Payer: Self-pay

## 2020-07-22 ENCOUNTER — Encounter: Payer: Self-pay | Admitting: Family Medicine

## 2020-07-22 ENCOUNTER — Ambulatory Visit (INDEPENDENT_AMBULATORY_CARE_PROVIDER_SITE_OTHER): Payer: PPO | Admitting: Family Medicine

## 2020-07-22 VITALS — BP 142/62 | HR 71 | Temp 98.5°F | Ht 63.0 in | Wt 179.1 lb

## 2020-07-22 DIAGNOSIS — E118 Type 2 diabetes mellitus with unspecified complications: Secondary | ICD-10-CM | POA: Diagnosis not present

## 2020-07-22 DIAGNOSIS — R194 Change in bowel habit: Secondary | ICD-10-CM | POA: Diagnosis not present

## 2020-07-22 DIAGNOSIS — E785 Hyperlipidemia, unspecified: Secondary | ICD-10-CM

## 2020-07-22 DIAGNOSIS — E1169 Type 2 diabetes mellitus with other specified complication: Secondary | ICD-10-CM | POA: Diagnosis not present

## 2020-07-22 DIAGNOSIS — K219 Gastro-esophageal reflux disease without esophagitis: Secondary | ICD-10-CM

## 2020-07-22 DIAGNOSIS — I10 Essential (primary) hypertension: Secondary | ICD-10-CM

## 2020-07-22 DIAGNOSIS — E669 Obesity, unspecified: Secondary | ICD-10-CM

## 2020-07-22 LAB — POCT GLYCOSYLATED HEMOGLOBIN (HGB A1C): Hemoglobin A1C: 5.9 % — AB (ref 4.0–5.6)

## 2020-07-22 MED ORDER — METFORMIN HCL ER 750 MG PO TB24: ORAL_TABLET | ORAL | 1 refills | Status: DC

## 2020-07-22 NOTE — Patient Instructions (Addendum)
Drop metformin XR to just 1 tablet in the morning. This may help GI issues.  Continue daily probiotics. Pass by lab to pick up stool kit.  Return in 3-4 months for diabetes follow up visit to see how you're doing on lower metformin dose.  

## 2020-07-22 NOTE — Progress Notes (Signed)
Patient ID: [name] [name], female    DOB: 1950-08-10, 70 y.o.   MRN: [id number]  This visit was conducted in person.  BP (!) 142/62   Pulse 71   Temp 98.5 F (36.9 C) (Temporal)   Ht _0  (1.6 m)   Wt 179 lb 1 oz (81.2 kg)   SpO2 96%   BMI 31.72 kg/m   144/64 on repeat testing  CC: transfer of care  Subjective:   HPI: [name] [name] is a 70 y.o. female presenting on 07/22/2020 for Transitions Of Care, incontinence (Of the bowels- has happened twice in last 2 months, while sleeping), and Sinus Problem (Since having covid in December, blood in nasal dicharge)   Last CPE 02/2020.  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   Notes lower abd pain associated with gassiness and alternating diarrhea or constipation over the past few years. Usually more trouble in evenings/night. No known dx IBS. Bowel incontinence x2 while sleeping over the past 2 months. No blood in stool. No pencil-thin stools. She has recently taken abx for sinusitis infections. She is taking probiotic daily. Currently taking culturelle. Was told by GYN to take metamucil or miralax daily. Known fatty liver - has seen nutritionist.   GERD - on pantoprazole daily, breakthrough symptoms if misses a dose.   Colonoscopy 2012 - [facility], rpt 10 yrs Sharlett Iles).   DM - on metformin XR 759m BID. Previously on victoza, but this was stopped presumably due to good control. Fasting cbgs in the morning run high - 140-160s. Bedtime 12am, wakes up at 7:30am. No paresthesias. No low sugars or hypoglycemic symptoms. Pneumovax 2018, prevnar 2016. Eye exam 01/2020.  Lab Results  Component Value Date   HGBA1C 5.9 (A) 07/22/2020        Relevant past medical, surgical, family and social history reviewed and updated as indicated. Interim medical history since our last visit reviewed. Allergies and medications reviewed and updated. Outpatient Medications Prior to Visit  Medication Sig Dispense Refill  . albuterol (VENTOLIN HFA)  108 (90 Base) MCG/ACT inhaler Inhale 1-2 puffs into the lungs every 6 (six) hours as needed for wheezing or shortness of breath. 1 each 0  . amoxicillin-clavulanate (AUGMENTIN) 875-125 MG tablet Take 1 tablet by mouth every 12 (twelve) hours. 14 tablet 0  . aspirin 81 MG tablet Take 81 mg by mouth daily.    . blood glucose meter kit and supplies KIT Dispense based on patient and insurance preference. Use up to four times daily as directed. (FOR ICD-9 250.00, 250.01). 1 each 0  . buPROPion (WELLBUTRIN XL) 300 MG 24 hr tablet Take 1 tablet (300 mg total) by mouth daily. 90 tablet 3  . Calcium Carbonate-Vitamin D 600-400 MG-UNIT tablet Take 2 tablets 2 (two) times daily by mouth.     . carvedilol (COREG) 12.5 MG tablet Take 1.5 tablets (18.75 mg total) by mouth 2 (two) times daily. 270 tablet 3  . cetirizine (ZYRTEC) 10 MG tablet Take 1 tablet (10 mg total) by mouth daily. 30 tablet 0  . Cholecalciferol (VITAMIN D3) 3000 units TABS Take 1 tablet by mouth daily. 30 tablet   . cloNIDine (CATAPRES) 0.1 MG tablet Take 1 tablet (0.1 mg total) by mouth 2 (two) times daily. 180 tablet 1  . fluticasone (FLONASE) 50 MCG/ACT nasal spray Place 2 sprays into both nostrils daily. 16 g 3  . glucose blood (ONETOUCH VERIO) test strip Use to check blood sugar 3 times  a day 100 each 0  . hydrALAZINE (APRESOLINE) 100 MG tablet Take 1 tablet (100 mg total) by mouth in the morning and at bedtime. 180 tablet 1  . Lancets (ONETOUCH ULTRASOFT) lancets Use to check blood sugar 3 times a day. 100 each 1  . Multiple Vitamins-Minerals (MULTIVITAMIN ADULTS 50+ PO) Take 1 tablet every morning by mouth.     . olmesartan-hydrochlorothiazide (BENICAR HCT) 40-12.5 MG tablet TAKE 1 TABLET BY MOUTH DAILY 90 tablet 3  . pantoprazole (PROTONIX) 40 MG tablet TAKE 1 TABLET(40 MG) BY MOUTH EVERY MORNING 90 tablet 1  . Probiotic Product (PROBIOTIC DAILY PO) Take 1 capsule every morning by mouth.     . rosuvastatin (CRESTOR) 20 MG tablet Take  1 tablet (20 mg total) by mouth daily. 90 tablet 3  . albuterol (VENTOLIN HFA) 108 (90 Base) MCG/ACT inhaler Inhale 2 puffs into the lungs every 4 (four) hours as needed for wheezing or shortness of breath (cough, shortness of breath or wheezing.). 18 g 0  . metFORMIN (GLUCOPHAGE-XR) 750 MG 24 hr tablet TAKE 1 TABLET(750 MG) BY MOUTH TWICE DAILY WITH A MEAL 180 tablet 1   No facility-administered medications prior to visit.     Per HPI unless specifically indicated in ROS section below Review of Systems Objective:  BP (!) 142/62   Pulse 71   Temp 98.5 F (36.9 C) (Temporal)   Ht _0  (1.6 m)   Wt 179 lb 1 oz (81.2 kg)   SpO2 96%   BMI 31.72 kg/m   Wt Readings from Last 3 Encounters:  07/22/20 179 lb 1 oz (81.2 kg)  06/17/20 177 lb (80.3 kg)  06/02/20 179 lb (81.2 kg)      Physical Exam Vitals and nursing note reviewed.  Constitutional:      Appearance: Normal appearance. She is not ill-appearing.  Cardiovascular:     Rate and Rhythm: Normal rate and regular rhythm.     Pulses: Normal pulses.     Heart sounds: Normal heart sounds. No murmur heard.   Pulmonary:     Effort: Pulmonary effort is normal. No respiratory distress.     Breath sounds: Normal breath sounds. No wheezing, rhonchi or rales.  Abdominal:     General: Bowel sounds are normal. There is no distension.     Palpations: Abdomen is soft. There is no mass.     Tenderness: There is no abdominal tenderness. There is no right CVA tenderness, left CVA tenderness, guarding or rebound.     Hernia: No hernia is present.  Musculoskeletal:     Right lower leg: No edema.     Left lower leg: No edema.  Skin:    General: Skin is warm and dry.     Findings: No rash.  Neurological:     Mental Status: She is alert.  Psychiatric:        Mood and Affect: Mood normal.        Behavior: Behavior normal.       Results for orders placed or performed in visit on 07/22/20  POCT glycosylated hemoglobin (Hb A1C)  Result  Value Ref Range   Hemoglobin A1C 5.9 (A) 4.0 - 5.6 %   HbA1c POC (<> result, manual entry)     HbA1c, POC (prediabetic range)     HbA1c, POC (controlled diabetic range)     Lab Results  Component Value Date   CHOL 115 02/10/2020   HDL 52.60 02/10/2020   LDLCALC 32 02/10/2020  LDLDIRECT 77.7 12/27/2009   TRIG 155.0 (H) 02/10/2020   CHOLHDL 2 02/10/2020    Assessment & Plan:  This visit occurred during the SARS-CoV-2 public health emergency.  Safety protocols were in place, including screening questions prior to the visit, additional usage of staff PPE, and extensive cleaning of exam room while observing appropriate contact time as indicated for disinfecting solutions.   Problem List Items Addressed This Visit    Hyperlipidemia associated with type 2 diabetes mellitus ([place])    Continue crestor, LDL at goal (32).       Relevant Medications   metFORMIN (GLUCOPHAGE-XR) 750 MG 24 hr tablet   GERD    Chronic, stable on daily PPI with breakthrough symptoms if dose missed.       Essential hypertension, benign    Chronic, stable on current regimen of carvedilol 18.57m bid, hydralazine 1076mbid, and clonidine 0.48m64mid. Also on benicar HCT 40/12.5mg69mily.       Controlled diabetes mellitus type 2 with complications ([facility]) - Primary    Chronic, stable. Previously on victoza this was stopped with better control now only on metformin XR 750mg72m.  Given great control and endorsed GI symptoms, suggested trial metformin XR 750mg 57m daily in am and update with effect on GI symptoms.       Relevant Medications   metFORMIN (GLUCOPHAGE-XR) 750 MG 24 hr tablet   Other Relevant Orders   POCT glycosylated hemoglobin (Hb A1C) (Completed)   Bowel habit changes    Longstanding symptoms suspicious for IBS. She has recently completed a few antibiotic courses which could have altered normal gut flora. She regularly takes probiotic - discussed this. UTD colonoscopy.  Will check iFOB r/o  bleed. Trial lower metformin dose.  Consider trial levsin vs IBGard.  Latest reassuring labs from 02/2020 reviewed.      Relevant Orders   Fecal occult blood, imunochemical       Meds ordered this encounter  Medications  . metFORMIN (GLUCOPHAGE-XR) 750 MG 24 hr tablet    Sig: TAKE 1 TABLET(750 MG) BY MOUTH TWICE DAILY WITH A MEAL    Dispense:  180 tablet    Refill:  1   Orders Placed This Encounter  Procedures  . Fecal occult blood, imunochemical    Standing Status:   Future    Standing Expiration Date:   07/22/2021  . POCT glycosylated hemoglobin (Hb A1C)    Patient Instructions  Drop metformin XR to just 1 tablet in the morning. This may help GI issues.  Continue daily probiotics. Pass by lab to pick up stool kit.  Return in 3-4 months for diabetes follow up visit to see how you're doing on lower metformin dose.    Follow up plan: Return in about 3 months (around 10/19/2020) for follow up visit.  JavierRia Bush

## 2020-07-23 DIAGNOSIS — R194 Change in bowel habit: Secondary | ICD-10-CM | POA: Insufficient documentation

## 2020-07-23 NOTE — Assessment & Plan Note (Signed)
Chronic, stable on daily PPI with breakthrough symptoms if dose missed.

## 2020-07-23 NOTE — Assessment & Plan Note (Addendum)
Continue crestor, LDL at goal (32).

## 2020-07-23 NOTE — Assessment & Plan Note (Addendum)
Longstanding symptoms suspicious for IBS. She has recently completed a few antibiotic courses which could have altered normal gut flora. She regularly takes probiotic - discussed this. UTD colonoscopy.  Will check iFOB r/o bleed. Trial lower metformin dose.  Consider trial levsin vs IBGard.  Latest reassuring labs from 02/2020 reviewed.

## 2020-07-23 NOTE — Assessment & Plan Note (Addendum)
Chronic, stable. Previously on victoza this was stopped with better control now only on metformin XR 750mg  BID.  Given great control and endorsed GI symptoms, suggested trial metformin XR 750mg  once daily in am and update with effect on GI symptoms.

## 2020-07-23 NOTE — Assessment & Plan Note (Signed)
Chronic, stable on current regimen of carvedilol 18.75mg  bid, hydralazine 100mg  bid, and clonidine 0.1mg  bid. Also on benicar HCT 40/12.5mg  daily.

## 2020-08-02 MED ORDER — CLONIDINE HCL 0.1 MG PO TABS: 0.1000 mg | ORAL_TABLET | Freq: Two times a day (BID) | ORAL | 1 refills | Status: DC

## 2020-08-04 ENCOUNTER — Ambulatory Visit: Payer: PPO | Admitting: Dietician

## 2020-08-08 ENCOUNTER — Encounter: Payer: Self-pay | Admitting: Family Medicine

## 2020-08-08 DIAGNOSIS — E118 Type 2 diabetes mellitus with unspecified complications: Secondary | ICD-10-CM

## 2020-08-08 MED ORDER — ONETOUCH ULTRA VI STRP: ORAL_STRIP | 3 refills | Status: DC

## 2020-08-08 NOTE — Telephone Encounter (Signed)
E-scribed refill for OneTouch Ultra test strips.

## 2020-08-17 ENCOUNTER — Ambulatory Visit: Payer: PPO | Admitting: Family Medicine

## 2020-08-25 ENCOUNTER — Telehealth: Payer: Self-pay

## 2020-08-25 NOTE — Telephone Encounter (Signed)
[facility] - Client TELEPHONE ADVICE RECORD [facility] Patient Name: [name] [name] Gender: Female DOB: Feb 27, 1951 Age: 70 Y 54 M 3 D Return Phone Number: [phone] (Primary) Address: City/State/Zip: [place] [address] Client [facility] - Client Client Site [place] Physician Ria Bush - MD Contact Type Call Who Is Calling Patient / Member / Family / Caregiver Call Type Triage / Clinical Relationship To Patient Self Return Phone Number [phone] (Primary) Chief Complaint Back Pain - General Reason for Call Symptomatic / Request for [name] states she has been running a fever of 100.1 and having diarrhea. Back pain on both side, possible kidney pain. Translation No Disp. Time [name] Time) Disposition Final User 08/25/2020 3:27:54 PM Attempt made - message left Darleen Crocker 08/25/2020 3:40:45 PM Attempt made - message left Darleen Crocker 08/25/2020 4:12:17 PM FINAL ATTEMPT MADE - message left Yes Donna Christen RN, Legrand Como

## 2020-08-25 NOTE — Telephone Encounter (Signed)
Per appt notes pt already has video visit with DR Diona Browner on 03/18/22at 11:20. Pt has not been having any appetite. No vomiting. Pt is not having abd pain but feels like upper stomach is irritated. UC & ED precautions given and pt voiced understanding. Sending note to [facility].

## 2020-08-26 ENCOUNTER — Telehealth: Payer: PPO | Admitting: Family Medicine

## 2020-08-26 ENCOUNTER — Other Ambulatory Visit: Payer: Self-pay

## 2020-08-26 NOTE — Telephone Encounter (Signed)
[facility] - Client TELEPHONE ADVICE RECORD [facility] Patient Name: [name] [name] Gender: Female DOB: [birth year] Age: 70 Y 9 M 3 D Return Phone Number: [phone] (Primary) Address: City/State/Zip: [place] [address] Client [facility] - Client Client Site [place] Physician Ria Bush - MD Contact Type Call Who Is Calling Patient / Member / Family / Caregiver Call Type Triage / Clinical Relationship To Patient Self Return Phone Number [phone] (Primary) Chief Complaint Abdominal Pain Reason for Call Symptomatic / Request for [place] from Dr .Danise Mina transferring a patient for triage. Caller states she has a fever and abdominal pain. She had diarrhea earlier and has pain on both sides of her lower back. She has a Engineer, building services. She had called earlier and missed the nurse's call. Translation No Nurse Assessment Nurse: Rolin Barry, RN, Levada Dy Date/Time [name] Time): 08/25/2020 4:45:37 PM Confirm and document reason for call. If symptomatic, describe symptoms. ---Donata Clay from Dr .Danise Mina transferring a patient for triage. Caller states she has a fever and abdominal pain. She had diarrhea earlier and has pain on both sides of her lower back. She has a Engineer, building services. She had called earlier and missed the nurse's call. Caller advised that she had diarrhea yesterday x 2. Highest temp 101.5, oral. Temp 99.5, current with tylenol. Current having abdominal irritation. No vomiting. No nausea. Does the patient have any new or worsening symptoms? ---Yes Will a triage be completed? ---Yes Related visit to physician within the last 2 weeks? ---No Does the PT have any chronic conditions? (i.e. diabetes, asthma, this includes High risk factors for pregnancy, etc.) ---Yes List chronic conditions. ---HTN Diabetes Is this a behavioral  health or substance abuse call? ---No Guidelines Guideline Title Affirmed Question Affirmed Notes Nurse Date/Time (Eastern Time) Abdominal Pain - Female [1] MILD-MODERATE pain AND [2] constant AND [3] present > 2 hours Deaton, RN, Levada Dy 08/25/2020 4:48:23 PM Disp. Time [name] Time) Disposition Final User PLEASE NOTE: All timestamps contained within this report are represented as [place] Standard Time. CONFIDENTIALTY NOTICE: This fax transmission is intended only for the addressee. It contains information that is legally privileged, confidential or otherwise protected from use or disclosure. If you are not the intended recipient, you are strictly prohibited from reviewing, disclosing, copying using or disseminating any of this information or taking any action in reliance on or regarding this information. If you have received this fax in error, please notify us immediately by telephone so that we can arrange for its return to [place]. Phone: [phone], Toll-Free: [phone], Fax: [phone] Page: 2 of 2 Call Id: [id number] 08/25/2020 4:51:41 PM See HCP within 4 Hours (or PCP triage) Yes Deaton, RN, Cindee Lame Disagree/Comply Disagree Caller Understands Yes PreDisposition Did not know what to do Care Advice Given Per Guideline SEE HCP (OR PCP TRIAGE) WITHIN 4 HOURS: CARE ADVICE given per Abdominal Pain, Female (Adult) guideline. * You become worse CALL BACK IF: * Do not eat or drink anything for now. NOTHING BY MOUTH: * ED: Patients who may need surgery or hospital admission need to be sent to an ED. So do most patients with serious symptoms or complex medical problems. Comments User: Saverio Danker, RN Date/Time [name] Time): 08/25/2020 4:55:00 PM Raquel Sarna at the backline made aware of the triage and the refusal at this time. Caller verbalized an understanding of the recommendation. Advised that she is going to wait until tomorrow to the virtual  appt. Referrals GO TO FACILITY REFUSED

## 2020-08-26 NOTE — Telephone Encounter (Signed)
See note below this access nurse note; pt has video visit today at 11:20. UC & ED precautions given to pt when I spoke with her on 08/25/20.

## 2020-08-29 ENCOUNTER — Encounter: Payer: Self-pay | Admitting: Dietician

## 2020-08-29 NOTE — Progress Notes (Signed)
Have not heard back from patient to reschedule her appointment from 08/04/20 (cancelled due to staff illness). Notification not sent; referring provider has moved out of the area.

## 2020-09-05 NOTE — Progress Notes (Signed)
HPI: FU coronary disease. She has had a prior PCI of her diagonal. Her most recent catheterization in April of 2004 showed no obstructive disease, and her ejection fraction was normal. Echocardiogram January 2020 showed normal LV function, small pericardial effusion.  Nuclear study January 2020 showed normal perfusion and ejection fraction 67%.  Abdominal ultrasound August 2020 showed no aneurysm.  Since I last saw her,patient denies dyspnea, chest pain, palpitations or syncope.  Current Outpatient Medications  Medication Sig Dispense Refill  . albuterol (VENTOLIN HFA) 108 (90 Base) MCG/ACT inhaler Inhale 1-2 puffs into the lungs every 6 (six) hours as needed for wheezing or shortness of breath. 1 each 0  . aspirin 81 MG tablet Take 81 mg by mouth daily.    . blood glucose meter kit and supplies KIT Dispense based on patient and insurance preference. Use up to four times daily as directed. (FOR ICD-9 250.00, 250.01). 1 each 0  . buPROPion (WELLBUTRIN XL) 300 MG 24 hr tablet Take 1 tablet (300 mg total) by mouth daily. 90 tablet 3  . Calcium Carbonate-Vitamin D 600-400 MG-UNIT tablet Take 2 tablets 2 (two) times daily by mouth.     . carvedilol (COREG) 12.5 MG tablet Take 1.5 tablets (18.75 mg total) by mouth 2 (two) times daily. 270 tablet 3  . Cholecalciferol (VITAMIN D3) 3000 units TABS Take 1 tablet by mouth daily. 30 tablet   . cloNIDine (CATAPRES) 0.1 MG tablet Take 1 tablet (0.1 mg total) by mouth 2 (two) times daily. 180 tablet 1  . fluticasone (FLONASE) 50 MCG/ACT nasal spray Place 2 sprays into both nostrils daily. 16 g 3  . glucose blood (ONETOUCH ULTRA) test strip Use as instructed to check blood sugar once daily. 100 each 3  . hydrALAZINE (APRESOLINE) 100 MG tablet Take 1 tablet (100 mg total) by mouth in the morning and at bedtime. 180 tablet 1  . Lancets (ONETOUCH ULTRASOFT) lancets Use to check blood sugar 3 times a day. 100 each 1  . metFORMIN (GLUCOPHAGE-XR) 750 MG 24 hr  tablet TAKE 1 TABLET(750 MG) BY MOUTH TWICE DAILY WITH A MEAL 180 tablet 1  . Multiple Vitamins-Minerals (MULTIVITAMIN ADULTS 50+ PO) Take 1 tablet every morning by mouth.     . olmesartan-hydrochlorothiazide (BENICAR HCT) 40-12.5 MG tablet TAKE 1 TABLET BY MOUTH DAILY 90 tablet 3  . pantoprazole (PROTONIX) 40 MG tablet TAKE 1 TABLET(40 MG) BY MOUTH EVERY MORNING 90 tablet 1  . Probiotic Product (PROBIOTIC DAILY PO) Take 1 capsule every morning by mouth.     . rosuvastatin (CRESTOR) 20 MG tablet Take 1 tablet (20 mg total) by mouth daily. 90 tablet 3   No current facility-administered medications for this visit.     Past Medical History:  Diagnosis Date  . Acquired deformity of right foot   . CAD (coronary artery disease) primary cardioloigst-  dr Stanford Breed   hx NSTEMI 10-24-2001  s/p  cardiac cath w/ PCI and DES x1 to first diagonal  . Chronic constipation   . COVID-19 06/02/2020  . Depression   . Fatty liver 04/26/2015  . GERD (gastroesophageal reflux disease)   . History of abnormal cervical Pap smear    ACUS 2012  . History of exercise stress test 05-25-2014  dr Stanford Breed   borderline (indeterminate) with non-specific ST changes (mild ST depression in leads II,III, aVF, & V6, did not meet criteria for ischemia) ;  pt had low intolerence, no cp, normal heartrate and bp response,  no arrhythmias  . History of goiter    as child s/p  removal ,  per pt benign , neck area  (not thyroid)  . History of non-ST elevation myocardial infarction (NSTEMI) 10/24/2001   s/p  PCI and DES to first diagonal  . Hyperlipidemia   . Hypertension   . OSA on CPAP pulmoloigst-  dr young   per study 11-07-2004  severe osa  . Right foot pain   . S/P drug eluting coronary stent placement 10/27/2001   x1 to first diagonal   . Seasonal and perennial allergic rhinitis   . Type 2 diabetes mellitus ([place])   . Vitamin D deficiency   . Wears dentures    upper and lower partial denture  . Wears glasses      Past Surgical History:  Procedure Laterality Date  . BUNIONECTOMY Left 2012  . BUNIONECTOMY Right 04/19/2017   Procedure: RIGHT BUNIONECTOMY;  Surgeon: Rosemary Holms, DPM;  Location[facility];  Service: Podiatry;  Laterality: Right;  . CARDIAC CATHETERIZATION  09-11-2002   dr Lia Foyer   well-preserved LVF; continued patency previouly placed stent in diagonal branch;  mild luminal irregularities   . CARDIOVASCULAR STRESS TEST  05-16-2011   dr Stanford Breed   normal nuclear study w/ no evidence ischemia/  normal LV function and wall motion , ef 77%  . CORONARY ANGIOPLASTY WITH STENT PLACEMENT  10-27-2001   dr Lia Foyer   high-grade stenosis first diagonal post PCI and DES (TAXUS);  mild luminal irregularity throughout LAD, RCA, and very mild CFx;  preserved LVF  . goiter removal  1968   per pt benign tumor removed from neck beside throat  . HALLUX VALGUS LAPIDUS Right 04/19/2017   Procedure: HALLUX VALGUS LAPIDUS FUSION;  Surgeon: Rosemary Holms, DPM;  Location: [place];  Service: Podiatry;  Laterality: Right;  . HAMMER TOE SURGERY Right 04/19/2017   Procedure: HAMMER TOE CORRECTION RIGHT 2ND;  Surgeon: Rosemary Holms, DPM;  Location: [facility];  Service: Podiatry;  Laterality: Right;  . METATARSAL OSTEOTOMY Right 04/19/2017   Procedure: 2ND METATARSAL OSTEOTOMY;  Surgeon: Rosemary Holms, DPM;  Location: [facility];  Service: Podiatry;  Laterality: Right;  . TONSILLECTOMY AND ADENOIDECTOMY  age 70  . TUBAL LIGATION Bilateral 1985    Social History   Socioeconomic History  . Marital status: Married    Spouse name: Not on file  . Number of children: 3  . Years of education: Not on file  . Highest education level: Not on file  Occupational History  . 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 Smoking status: Former Smoker    Years: 46.00     Types: Cigarettes    Quit date: 09/23/2012    Years since quitting: 7.9  . Smokeless tobacco: Never Used  Vaping Use  . Vaping Use: Never used  Substance and Sexual Activity  . Alcohol use: Yes    Alcohol/week: 0.0 standard drinks    Comment: occasional wine  . Drug use: No  . Sexual activity: Not Currently  Other Topics Concern  . Not on file  Social History Narrative   Caffeine Use:  2 cups coffee daily   Regular exercise:  No      Social Determinants of Health   Financial Resource Strain: Low Risk   . Difficulty of Paying Living Expenses: Not hard at all  Food  Insecurity: No Food Insecurity  . Worried About Charity fundraiser in the Last Year: Never true  . Ran Out of Food in the Last Year: Never true  Transportation Needs: No Transportation Needs  . Lack of Transportation (Medical): No  . Lack of Transportation (Non-Medical): No  Physical Activity: Inactive  . Days of Exercise per Week: 0 days  . Minutes of Exercise per Session: 0 min  Stress: No Stress Concern Present  . Feeling of Stress : Not at all  Social Connections: Not on file  Intimate Partner Violence: Not At Risk  . Fear of Current or Ex-Partner: No  . Emotionally Abused: No  . Physically Abused: No  . Sexually Abused: No    Family History  Problem Relation Age of Onset  . Cancer Mother        throat, cervical  . Diabetes Mother   . Stroke Mother   . Heart attack Mother   . Heart disease Mother   . Hypertension Mother   . Thyroid disease Mother   . Alcoholism Mother   . Cancer Father        prostate  . Diabetes Sister   . Heart attack Sister   . Hypertension Sister   . Hyperlipidemia Sister   . Colon cancer Neg Hx   . Breast cancer Neg Hx     ROS: no fevers or chills, productive cough, hemoptysis, dysphasia, odynophagia, melena, hematochezia, dysuria, hematuria, rash, seizure activity, orthopnea, PND, pedal edema, claudication. Remaining systems are negative.  Physical  Exam: Well-developed well-nourished in no acute distress.  Skin is warm and dry.  HEENT is normal.  Neck is supple.  Chest is clear to auscultation with normal expansion.  Cardiovascular exam is regular rate and rhythm.  Abdominal exam nontender or distended. No masses palpated. Extremities show no edema. neuro grossly intact  ECG-normal sinus rhythm at a rate of 62, no ST changes.  Personally reviewed  A/P  1 coronary artery disease-patient denies chest pain.  Continue medical therapy with aspirin and statin.  2 hypertension-blood pressure controlled.  Continue present medical regimen.  3 hyperlipidemia-continue statin.  4 lower extremity edema-continue diuretic at present dose.   Kirk Ruths, MD

## 2020-09-09 ENCOUNTER — Other Ambulatory Visit: Payer: Self-pay

## 2020-09-09 ENCOUNTER — Ambulatory Visit: Payer: PPO | Admitting: Cardiology

## 2020-09-09 ENCOUNTER — Encounter: Payer: Self-pay | Admitting: Cardiology

## 2020-09-09 VITALS — BP 136/72 | HR 62 | Ht 63.0 in | Wt 178.6 lb

## 2020-09-09 DIAGNOSIS — E785 Hyperlipidemia, unspecified: Secondary | ICD-10-CM

## 2020-09-09 DIAGNOSIS — I10 Essential (primary) hypertension: Secondary | ICD-10-CM

## 2020-09-09 DIAGNOSIS — I2510 Atherosclerotic heart disease of native coronary artery without angina pectoris: Secondary | ICD-10-CM | POA: Diagnosis not present

## 2020-09-09 NOTE — Patient Instructions (Signed)

## 2020-10-06 ENCOUNTER — Ambulatory Visit
Admission: RE | Admit: 2020-10-06 | Discharge: 2020-10-06 | Disposition: A | Discharge status: Home / Self Care | Payer: PPO | Source: Ambulatory Visit | Attending: Emergency Medicine | Admitting: Emergency Medicine

## 2020-10-06 ENCOUNTER — Other Ambulatory Visit: Payer: Self-pay

## 2020-10-06 VITALS — BP 143/84 | HR 67 | Temp 98.7°F | Resp 19

## 2020-10-06 DIAGNOSIS — Z20822 Contact with and (suspected) exposure to covid-19: Secondary | ICD-10-CM | POA: Diagnosis not present

## 2020-10-06 DIAGNOSIS — J302 Other seasonal allergic rhinitis: Secondary | ICD-10-CM | POA: Diagnosis not present

## 2020-10-06 DIAGNOSIS — J069 Acute upper respiratory infection, unspecified: Secondary | ICD-10-CM

## 2020-10-06 NOTE — ED Provider Notes (Signed)
[facility]    CSN: [id number] Arrival date & time: 10/06/20  1145      History   Chief Complaint Chief Complaint  Patient presents with  . Nasal Congestion    HPI [name] [name] is a 70 y.o. female.   Patient presents with 1 day history of nasal congestion, runny nose, postnasal drip, nonproductive cough, low-grade fever.  T-max 99.1.  She denies rash, sore throat, shortness of breath, vomiting, diarrhea, or other symptoms.  Treatment attempted at home with Flonase nasal spray.  Patient was seen here on 06/17/2020; diagnosed with acute sinusitis; treated with Augmentin.  Her medical history includes seasonal allergies, COVID in December 2021, hypertension, CAD, NSTEMI, diabetes, GERD, chronic constipation, former smoker.  The history is provided by the patient and medical records.    Past Medical History:  Diagnosis Date  . Acquired deformity of right foot   . CAD (coronary artery disease) primary cardioloigst-  dr crenshaw   hx NSTEMI 10-24-2001  s/p  cardiac cath w/ PCI and DES x1 to first diagonal  . Chronic constipation   . COVID-19 06/02/2020  . Depression   . Fatty liver 04/26/2015  . GERD (gastroesophageal reflux disease)   . History of abnormal cervical Pap smear    ACUS 2012  . History of exercise stress test 05-25-2014  dr crenshaw   borderline (indeterminate) with non-specific ST changes (mild ST depression in leads II,III, aVF, & V6, did not meet criteria for ischemia) ;  pt had low intolerence, no cp, normal heartrate and bp response, no arrhythmias  . History of goiter    as child s/p  removal ,  per pt benign , neck area  (not thyroid)  . History of non-ST elevation myocardial infarction (NSTEMI) 10/24/2001   s/p  PCI and DES to first diagonal  . Hyperlipidemia   . Hypertension   . OSA on CPAP pulmoloigst-  dr young   per study 11-07-2004  severe osa  . Right foot pain   . S/P drug eluting coronary stent placement 10/27/2001   x1 to first  diagonal   . Seasonal and perennial allergic rhinitis   . Type 2 diabetes mellitus (HCC)   . Vitamin D deficiency   . Wears dentures    upper and lower partial denture  . Wears glasses     Patient Active Problem List   Diagnosis Date Noted  . Bowel habit changes 07/23/2020  . Suspected COVID-19 virus infection 06/18/2019  . Primary osteoarthritis of both first carpometacarpal joints 05/01/2018  . NAFLD (nonalcoholic fatty liver disease) 09/20/2016  . Vitamin D deficiency 09/20/2016  . Obesity, Class I, BMI 30-34.9 09/05/2016  . Left carpal tunnel syndrome 08/18/2015  . Onychomycosis 02/03/2014  . Urinary incontinence 10/29/2013  . Tinea pedis 07/08/2013  . Weight gain 09/28/2012  . Allergic rhinitis 09/28/2012  . Intertrigo 05/28/2012  . Microalbuminuria 11/29/2011  . Osteopenia 07/13/2011  . Internal and external hemorrhoids without complication 05/30/2011  . Benign neoplasm of colon 05/30/2011  . Preventative health care 05/09/2011  . Essential hypertension, benign 01/12/2009  . Controlled diabetes mellitus type 2 with complications (HCC) 01/11/2007  . Hyperlipidemia associated with type 2 diabetes mellitus (HCC) 01/11/2007  . DEPRESSION 01/11/2007  . OBSTRUCTIVE SLEEP APNEA 01/11/2007  . Coronary atherosclerosis 01/11/2007  . Seasonal and perennial allergic rhinitis 01/11/2007  . GERD 01/11/2007    Past Surgical History:  Procedure Laterality Date  . BUNIONECTOMY Left 2012  . BUNIONECTOMY Right 04/19/2017     Procedure: RIGHT BUNIONECTOMY;  Surgeon: Rosemary Holms, DPM;  Location: [facility];  Service: Podiatry;  Laterality: Right;  . CARDIAC CATHETERIZATION  09-11-2002   dr Lia Foyer   well-preserved LVF; continued patency previouly placed stent in diagonal branch;  mild luminal irregularities   . CARDIOVASCULAR STRESS TEST  05-16-2011   dr Stanford Breed   normal nuclear study w/ no evidence ischemia/  normal LV function and wall motion , ef 77%  . CORONARY  ANGIOPLASTY WITH STENT PLACEMENT  10-27-2001   dr Lia Foyer   high-grade stenosis first diagonal post PCI and DES (TAXUS);  mild luminal irregularity throughout LAD, RCA, and very mild CFx;  preserved LVF  . goiter removal  1968   per pt benign tumor removed from neck beside throat  . HALLUX VALGUS LAPIDUS Right 04/19/2017   Procedure: HALLUX VALGUS LAPIDUS FUSION;  Surgeon: Rosemary Holms, DPM;  Location: [facility];  Service: Podiatry;  Laterality: Right;  . HAMMER TOE SURGERY Right 04/19/2017   Procedure: HAMMER TOE CORRECTION RIGHT 2ND;  Surgeon: Rosemary Holms, DPM;  Location: [facility];  Service: Podiatry;  Laterality: Right;  . METATARSAL OSTEOTOMY Right 04/19/2017   Procedure: 2ND METATARSAL OSTEOTOMY;  Surgeon: Rosemary Holms, DPM;  Location: [facility];  Service: Podiatry;  Laterality: Right;  . TONSILLECTOMY AND ADENOIDECTOMY  age 71  . TUBAL LIGATION Bilateral 1985    OB History    Gravida  3   Para  3   Term  3   Preterm      AB      Living  3     SAB      IAB      Ectopic      Multiple      Live Births               Home Medications    Prior to Admission medications   Medication Sig Start Date End Date Taking? Authorizing Provider  albuterol (VENTOLIN HFA) 108 (90 Base) MCG/ACT inhaler Inhale 1-2 puffs into the lungs every 6 (six) hours as needed for wheezing or shortness of breath. 06/02/20   Loura Halt A, NP  aspirin 81 MG tablet Take 81 mg by mouth daily.    [provider]  blood glucose meter kit and supplies KIT Dispense based on patient and insurance preference. Use up to four times daily as directed. (FOR ICD-9 250.00, 250.01). 02/17/20   Elby Beck, FNP  buPROPion (WELLBUTRIN XL) 300 MG 24 hr tablet Take 1 tablet (300 mg total) by mouth daily. 05/17/20   Cottle, Billey Co., MD  Calcium Carbonate-Vitamin D 600-400 MG-UNIT tablet Take 2 tablets 2 (two) times daily by mouth.      [provider]  carvedilol (COREG) 12.5 MG tablet Take 1.5 tablets (18.75 mg total) by mouth 2 (two) times daily. 12/01/19   Almyra Deforest, PA  Cholecalciferol (VITAMIN D3) 3000 units TABS Take 1 tablet by mouth daily. 06/26/17   Debbrah Alar, NP  cloNIDine (CATAPRES) 0.1 MG tablet Take 1 tablet (0.1 mg total) by mouth 2 (two) times daily. 08/02/20   Lelon Perla, MD  fluticasone (FLONASE) 50 MCG/ACT nasal spray Place 2 sprays into both nostrils daily. 06/02/20   Bast, Tressia Miners A, NP  glucose blood (ONETOUCH ULTRA) test strip Use as instructed to check blood sugar once daily. 08/08/20   Ria Bush, MD  hydrALAZINE (APRESOLINE) 100 MG tablet Take 1 tablet (100 mg total) by  mouth in the morning and at bedtime. 07/01/20   Crenshaw, Brian S, MD  Lancets (ONETOUCH ULTRASOFT) lancets Use to check blood sugar 3 times a day. 01/13/20   Gessner, Deborah B, FNP  metFORMIN (GLUCOPHAGE-XR) 750 MG 24 hr tablet TAKE 1 TABLET(750 MG) BY MOUTH TWICE DAILY WITH A MEAL 07/22/20   Gutierrez, Javier, MD  Multiple Vitamins-Minerals (MULTIVITAMIN ADULTS 50+ PO) Take 1 tablet every morning by mouth.     [provider]  olmesartan-hydrochlorothiazide (BENICAR HCT) 40-12.5 MG tablet TAKE 1 TABLET BY MOUTH DAILY 04/20/20   Crenshaw, Brian S, MD  pantoprazole (PROTONIX) 40 MG tablet TAKE 1 TABLET(40 MG) BY MOUTH EVERY MORNING 05/30/20   Gessner, Deborah B, FNP  Probiotic Product (PROBIOTIC DAILY PO) Take 1 capsule every morning by mouth.     [provider]  rosuvastatin (CRESTOR) 20 MG tablet Take 1 tablet (20 mg total) by mouth daily. 10/27/19   Crenshaw, Brian S, MD    Family History Family History  Problem Relation Age of Onset  . Cancer Mother        throat, cervical  . Diabetes Mother   . Stroke Mother   . Heart attack Mother   . Heart disease Mother   . Hypertension Mother   . Thyroid disease Mother   . Alcoholism Mother   . Cancer Father        prostate  . Diabetes Sister    . Heart attack Sister   . Hypertension Sister   . Hyperlipidemia Sister   . Colon cancer Neg Hx   . Breast cancer Neg Hx     Social History Social History   Tobacco Use  . Smoking status: Former Smoker    Years: 46.00    Types: Cigarettes    Quit date: 09/23/2012    Years since quitting: 8.0  . Smokeless tobacco: Never Used  Vaping Use  . Vaping Use: Never used  Substance Use Topics  . Alcohol use: Yes    Alcohol/week: 0.0 standard drinks    Comment: occasional wine  . Drug use: No     Allergies   Patient has no known allergies.   Review of Systems Review of Systems  Constitutional: Positive for fever. Negative for chills.  HENT: Positive for congestion, postnasal drip and rhinorrhea. Negative for ear pain and sore throat.   Eyes: Negative for pain and visual disturbance.  Respiratory: Positive for cough. Negative for shortness of breath.   Cardiovascular: Negative for chest pain and palpitations.  Gastrointestinal: Negative for abdominal pain, diarrhea and vomiting.  Genitourinary: Negative for dysuria and hematuria.  Musculoskeletal: Negative for arthralgias and back pain.  Skin: Negative for color change and rash.  Neurological: Negative for seizures and syncope.  All other systems reviewed and are negative.    Physical Exam Triage Vital Signs ED Triage Vitals  Enc Vitals Group     BP      Pulse      Resp      Temp      Temp src      SpO2      Weight      Height      Head Circumference      Peak Flow      Pain Score      Pain Loc      Pain Edu?      Excl. in GC?    No data found.  Updated Vital Signs BP (!) 143/84   Pulse 67     Temp 98.7 F (37.1 C)   Resp 19   SpO2 94%   Visual Acuity Right Eye Distance:   Left Eye Distance:   Bilateral Distance:    Right Eye Near:   Left Eye Near:    Bilateral Near:     Physical Exam Vitals and nursing note reviewed.  Constitutional:      General: She is not in acute distress.     Appearance: She is well-developed. She is not ill-appearing.  HENT:     Head: Normocephalic and atraumatic.     Right Ear: Tympanic membrane normal.     Left Ear: Tympanic membrane normal.     Nose: Rhinorrhea present.     Mouth/Throat:     Mouth: Mucous membranes are moist.     Pharynx: Oropharynx is clear.  Eyes:     Conjunctiva/sclera: Conjunctivae normal.  Cardiovascular:     Rate and Rhythm: Normal rate and regular rhythm.     Heart sounds: Normal heart sounds.  Pulmonary:     Effort: Pulmonary effort is normal. No respiratory distress.     Breath sounds: Normal breath sounds.  Abdominal:     Palpations: Abdomen is soft.     Tenderness: There is no abdominal tenderness.  Musculoskeletal:     Cervical back: Neck supple.  Skin:    General: Skin is warm and dry.  Neurological:     General: No focal deficit present.     Mental Status: She is alert and oriented to person, place, and time.     Gait: Gait normal.  Psychiatric:        Mood and Affect: Mood normal.        Behavior: Behavior normal.      UC Treatments / Results  Labs (all labs ordered are listed, but only abnormal results are displayed) Labs Reviewed - No data to display  EKG   Radiology No results found.  Procedures Procedures (including critical care time)  Medications Ordered in UC Medications - No data to display  Initial Impression / Assessment and Plan / UC Course  I have reviewed the triage vital signs and the nursing notes.  Pertinent labs & imaging results that were available during my care of the patient were reviewed by me and considered in my medical decision making (see chart for details).   Seasonal allergies, URI.  Patient is well-appearing and her exam is reassuring.  She is afebrile.  Instructed patient to continue Flonase nasal spray and start taking OTC Allegra for her allergy symptoms.  PCR COVID pending.  Instructed patient to self quarantine until the test result is back.   Discussed that she should follow-up with her PCP if her symptoms are not improving.  She agrees to plan of care.   Final Clinical Impressions(s) / UC Diagnoses   Final diagnoses:  Seasonal allergies  Upper respiratory tract infection, unspecified type     Discharge Instructions     Continue using Flonase nasal spray.  Start taking over-the-counter Allegra for your allergy symptoms.    Your COVID test is pending.  You should self quarantine until the test result is back.    Follow up with your primary care provider if your symptoms are not improving.        ED Prescriptions    None     PDMP not reviewed this encounter.   Tate, Kelly H, NP 10/06/20 1210  

## 2020-10-06 NOTE — ED Triage Notes (Signed)
Pt presents with complaints of nasal congestion and low grade fever(99.1). reports she is feeling congestion in her head and sleepy. Patient is beginning to have a dry cough. Symptoms started one day ago.

## 2020-10-06 NOTE — Discharge Instructions (Signed)
Continue using Flonase nasal spray.  Start taking over-the-counter Allegra for your allergy symptoms.    Your COVID test is pending.  You should self quarantine until the test result is back.    Follow up with your primary care provider if your symptoms are not improving.

## 2020-10-07 LAB — SARS-COV-2, NAA 2 DAY TAT

## 2020-10-07 LAB — NOVEL CORONAVIRUS, NAA: SARS-CoV-2, NAA: NOT DETECTED

## 2020-10-12 ENCOUNTER — Telehealth (INDEPENDENT_AMBULATORY_CARE_PROVIDER_SITE_OTHER): Payer: PPO | Admitting: Family Medicine

## 2020-10-12 ENCOUNTER — Other Ambulatory Visit: Payer: Self-pay

## 2020-10-12 ENCOUNTER — Encounter: Payer: Self-pay | Admitting: Family Medicine

## 2020-10-12 VITALS — BP 137/76 | HR 58 | Temp 98.2°F | Ht 63.0 in | Wt 178.0 lb

## 2020-10-12 DIAGNOSIS — R059 Cough, unspecified: Secondary | ICD-10-CM | POA: Diagnosis not present

## 2020-10-12 DIAGNOSIS — H1031 Unspecified acute conjunctivitis, right eye: Secondary | ICD-10-CM

## 2020-10-12 MED ORDER — AZITHROMYCIN 250 MG PO TABS: ORAL_TABLET | ORAL | 0 refills | Status: DC

## 2020-10-12 MED ORDER — POLYMYXIN B-TRIMETHOPRIM 10000-0.1 UNIT/ML-% OP SOLN: 1.0000 [drp] | Freq: Four times a day (QID) | OPHTHALMIC | 0 refills | Status: DC

## 2020-10-12 NOTE — Assessment & Plan Note (Signed)
Anticipate acute viral bronchitis. Supportive care reviewed - rec fluids, rest, may use robitussin or delsym OTC PRN cough.  WASP for zpack with instructions when to fill provided - given this in diabetic, with worsening symptoms over the past week.

## 2020-10-12 NOTE — Progress Notes (Signed)
  Patient ID: [name] [name], female    DOB: 03/18/1951, 70 y.o.   MRN: [id number]  Virtual visit completed through MyChart, a video enabled telemedicine application. Due to national recommendations of social distancing due to COVID-19, a virtual visit is felt to be most appropriate for this patient at this time. Reviewed limitations, risks, security and privacy concerns of performing a virtual visit and the availability of in person appointments. I also reviewed that there may be a patient responsible charge related to this service. The patient agreed to proceed.   Patient location: home Provider location: [facility] [place], office Persons participating in this virtual visit: patient, provider   If any vitals were documented, they were collected by patient at home unless specified below.    BP 137/76   Pulse (!) 58   Temp 98.2 F (36.8 C)   Ht 5' 3" (1.6 m)   Wt 178 lb (80.7 kg)   BMI 31.53 kg/m    CC: cough, congestion, conjunctivitis Subjective:   HPI: [name] [name] is a 70 y.o. female presenting on 10/12/2020 for Cough (C/o productive cough with yellowish mucous.  Also, nasal congestion, watery eyes and chills.  Sxs started 10/05/20.  Seen on 10/06/20 at [facility], dx seasonal allergies.  States sxs are not improving. ) and Conjunctivitis (C/o redness in right eye.  Woke this morning with eye matted together. )   1 wk h/o nasal congestion, rhinorrhea, PNDrainage, ST, productive cough of colored sputum. No fever - Tmax 99.1.   Seen at [facility] last week, tested negative for COVID. Dx allergic rhinitis with URI, supportive care recommended with flonase, allegra (too drying).   Cough has since progressively worsened. Some coughing fits. Woke up with red eye, eye discomfort and watery drainage from the eye. Crusty eye discharge this morning. Dull R frontal headache. Some hoarseness. Overall sleeping well - uses CPAP machine.   No abd pain, nausea, diarrhea. No dyspnea or wheezing.  No  h/o asthma Non smoker.   She did have COVID illness 05/2020, symptoms fully recovered.  Feels like staying well hydrated. Well controlled diabetic. Doing overall ok on lower metformin dose - this has helped diarrhea.  Lab Results  Component Value Date   HGBA1C 5.9 (A) 07/22/2020        Relevant past medical, surgical, family and social history reviewed and updated as indicated. Interim medical history since our last visit reviewed. Allergies and medications reviewed and updated. Outpatient Medications Prior to Visit  Medication Sig Dispense Refill  . albuterol (VENTOLIN HFA) 108 (90 Base) MCG/ACT inhaler Inhale 1-2 puffs into the lungs every 6 (six) hours as needed for wheezing or shortness of breath. 1 each 0  . aspirin 81 MG tablet Take 81 mg by mouth daily.    . blood glucose meter kit and supplies KIT Dispense based on patient and insurance preference. Use up to four times daily as directed. (FOR ICD-9 250.00, 250.01). 1 each 0  . buPROPion (WELLBUTRIN XL) 150 MG 24 hr tablet Take 3 tablets by mouth daily.    . Calcium Carbonate-Vitamin D 600-400 MG-UNIT tablet Take 2 tablets 2 (two) times daily by mouth.     . carvedilol (COREG) 12.5 MG tablet Take 1.5 tablets (18.75 mg total) by mouth 2 (two) times daily. 270 tablet 3  . Cholecalciferol (VITAMIN D3) 3000 units TABS Take 1 tablet by mouth daily. 30 tablet   . cloNIDine (CATAPRES) 0.1 MG tablet Take 1 tablet (0.1 mg total)   by mouth 2 (two) times daily. 180 tablet 1  . fluticasone (FLONASE) 50 MCG/ACT nasal spray Place 2 sprays into both nostrils daily. 16 g 3  . glucose blood (ONETOUCH ULTRA) test strip Use as instructed to check blood sugar once daily. 100 each 3  . hydrALAZINE (APRESOLINE) 100 MG tablet Take 1 tablet (100 mg total) by mouth in the morning and at bedtime. 180 tablet 1  . Lancets (ONETOUCH ULTRASOFT) lancets Use to check blood sugar 3 times a day. 100 each 1  . metFORMIN (GLUCOPHAGE-XR) 750 MG 24 hr tablet TAKE 1  TABLET(750 MG) BY MOUTH TWICE DAILY WITH A MEAL 180 tablet 1  . Multiple Vitamins-Minerals (MULTIVITAMIN ADULTS 50+ PO) Take 1 tablet every morning by mouth.     . olmesartan-hydrochlorothiazide (BENICAR HCT) 40-12.5 MG tablet TAKE 1 TABLET BY MOUTH DAILY 90 tablet 3  . pantoprazole (PROTONIX) 40 MG tablet TAKE 1 TABLET(40 MG) BY MOUTH EVERY MORNING 90 tablet 1  . Probiotic Product (PROBIOTIC DAILY PO) Take 1 capsule every morning by mouth.     . rosuvastatin (CRESTOR) 20 MG tablet Take 1 tablet (20 mg total) by mouth daily. 90 tablet 3  . buPROPion (WELLBUTRIN XL) 300 MG 24 hr tablet Take 1 tablet (300 mg total) by mouth daily. 90 tablet 3   No facility-administered medications prior to visit.     Per HPI unless specifically indicated in ROS section below Review of Systems Objective:  BP 137/76   Pulse (!) 58   Temp 98.2 F (36.8 C)   Ht 5' 3" (1.6 m)   Wt 178 lb (80.7 kg)   BMI 31.53 kg/m   Wt Readings from Last 3 Encounters:  10/12/20 178 lb (80.7 kg)  09/09/20 178 lb 9.6 oz (81 kg)  07/22/20 179 lb 1 oz (81.2 kg)       Physical exam: Gen: alert, NAD, not ill appearing Pulm: speaks in complete sentences without increased work of breathing Psych: normal mood, normal thought content      Results for orders placed or performed during the hospital encounter of 10/06/20  Novel Coronavirus, NAA ([facility])   Specimen: Nasopharyngeal(NP) swabs in vial transport medium   Nasopharynge  Result Value Ref Range   SARS-CoV-2, NAA Not Detected Not Detected  SARS-COV-2, NAA 2 DAY TAT   Nasopharynge  Result Value Ref Range   SARS-CoV-2, NAA 2 DAY TAT Performed    Assessment & Plan:   Problem List Items Addressed This Visit    Conjunctivitis, acute    rec cool compresses for symptomatic relief. Will cover for bacterial conjunctivitis with polytrim eye drops.       Cough - Primary    Anticipate acute viral bronchitis. Supportive care reviewed - rec fluids, rest, may use  robitussin or delsym OTC PRN cough.  WASP for zpack with instructions when to fill provided - given this in diabetic, with worsening symptoms over the past week.           Meds ordered this encounter  Medications  . trimethoprim-polymyxin b (POLYTRIM) ophthalmic solution    Sig: Place 1 drop into both eyes every 6 (six) hours. For 5 days    Dispense:  10 mL    Refill:  0  . azithromycin (ZITHROMAX) 250 MG tablet    Sig: Take two tablets on day one followed by one tablet on days 2-5    Dispense:  6 each    Refill:  0   No orders of the defined   types were placed in this encounter.   I discussed the assessment and treatment plan with the patient. The patient was provided an opportunity to ask questions and all were answered. The patient agreed with the plan and demonstrated an understanding of the instructions. The patient was advised to call back or seek an in-person evaluation if the symptoms worsen or if the condition fails to improve as anticipated.  Follow up plan: Return if symptoms worsen or fail to improve.  Javier Gutierrez, MD   

## 2020-10-12 NOTE — Assessment & Plan Note (Signed)
rec cool compresses for symptomatic relief. Will cover for bacterial conjunctivitis with polytrim eye drops.

## 2020-10-12 NOTE — Telephone Encounter (Signed)
Agree with virtual visit for further evaluation  Do I have 12:30 opening?

## 2020-10-12 NOTE — Telephone Encounter (Signed)
Spoke with pt adding her on at 11:00 today for video visit due to cancellation.

## 2020-10-12 NOTE — Telephone Encounter (Signed)
Ok to add pt at 12:30 today for video visit?

## 2020-10-21 ENCOUNTER — Other Ambulatory Visit: Payer: Self-pay

## 2020-10-21 ENCOUNTER — Encounter: Payer: Self-pay | Admitting: Family Medicine

## 2020-10-21 ENCOUNTER — Ambulatory Visit (INDEPENDENT_AMBULATORY_CARE_PROVIDER_SITE_OTHER): Payer: PPO | Admitting: Family Medicine

## 2020-10-21 VITALS — BP 162/70 | HR 65 | Temp 97.7°F | Ht 63.0 in | Wt 179.1 lb

## 2020-10-21 DIAGNOSIS — I10 Essential (primary) hypertension: Secondary | ICD-10-CM

## 2020-10-21 DIAGNOSIS — E118 Type 2 diabetes mellitus with unspecified complications: Secondary | ICD-10-CM | POA: Diagnosis not present

## 2020-10-21 DIAGNOSIS — R194 Change in bowel habit: Secondary | ICD-10-CM | POA: Diagnosis not present

## 2020-10-21 DIAGNOSIS — K143 Hypertrophy of tongue papillae: Secondary | ICD-10-CM

## 2020-10-21 LAB — POCT GLYCOSYLATED HEMOGLOBIN (HGB A1C): Hemoglobin A1C: 6.2 % — AB (ref 4.0–5.6)

## 2020-10-21 MED ORDER — NYSTATIN 100000 UNIT/ML MT SUSP: 5.0000 mL | Freq: Three times a day (TID) | OROMUCOSAL | 0 refills | Status: DC

## 2020-10-21 MED ORDER — IBGARD 90 MG PO CPCR: 1.0000 | ORAL_CAPSULE | Freq: Three times a day (TID) | ORAL | Status: DC | PRN

## 2020-10-21 NOTE — Assessment & Plan Note (Signed)
Overall improved diarrhea,now with some constipation. Still suspect IBS - will trial OTC IBguard.

## 2020-10-21 NOTE — Assessment & Plan Note (Addendum)
Chronic, overall stable based on A1c today although notes impaired fasting sugar control at home - will change metformin XR to 750mg  nightly, continue diabetic diet.  Check B12 levels next labwork for endorsed mental fogginess.

## 2020-10-21 NOTE — Assessment & Plan Note (Signed)
BP elevated today - has not yet taken morning medications - she will take right when she gets home and monitor blood pressures closely throughout the day and let me know if persistenly elevated. I have asked her to bring in BP cuff to next visit to compare readings. She will let me know if BP consistently staying >140/90

## 2020-10-21 NOTE — Progress Notes (Signed)
Patient ID: [name] [name], female    DOB: 25-Feb-1951, 70 y.o.   MRN: [id number]  This visit was conducted in person.  BP (!) 162/70   Pulse 65   Temp 97.7 F (36.5 C) (Temporal)   Ht '5\' 3"'  (1.6 m)   Wt 179 lb 2 oz (81.3 kg)   SpO2 96%   BMI 31.73 kg/m   ~180/80 on retesting. Home reading this morning 130/70  CC: DM f/u visit  Subjective:   HPI: [name] [name] is a 70 y.o. female presenting on 10/21/2020 for Diabetes (Here for 3 mo f/u.)   Recent presumed viral bronchitis with conjunctivitis - improving.   Notes dry eyes and very dry mouth. Notes film out of mouth. She wears partial dentures. Ongoing issue for the past year.  Mental fog present for a few months.   GI symptoms - ongoing lower abd pain that improve with BM. She continues probiotic. GERD symptoms largely well controlled on daily PPI.   HTN - BP elevated this morning - she didn't take meds yet - continues carvedilol 12.60m bid, hydralazine 1032mtwice daily, benicar hctz 40/12.8m34maily. BP this morning 130/70 - largely well controlled at home. No HA, vision changes, CP/tightness, SOB, leg swelling.   DM - does regularly check sugars fasting - 160-170 fasting. Compliant with antihyperglycemic regimen which includes: metformin XR 750m38m (decrease from previous dose given GI symptoms - iFOB pending). Denies low sugars or hypoglycemic symptoms. Denies paresthesias. Last diabetic eye exam 01/2020 - notes ongoing trouble with dry eyes. Pneumovax: 2018. Prevnar: 2016. Glucometer brand: one touch. DSME: remote. Lab Results  Component Value Date   HGBA1C 6.2 (A) 10/21/2020   Diabetic Foot Exam - Simple   Simple Foot Form Diabetic Foot exam was performed with the following findings: Yes 10/21/2020 10:23 AM  Visual Inspection No deformities, no ulcerations, no other skin breakdown bilaterally: Yes Sensation Testing Intact to touch and monofilament testing bilaterally: Yes Pulse Check Posterior Tibialis and Dorsalis  pulse intact bilaterally: Yes Comments    Lab Results  Component Value Date   MICROALBUR 1.3 02/10/2020         Relevant past medical, surgical, family and social history reviewed and updated as indicated. Interim medical history since our last visit reviewed. Allergies and medications reviewed and updated. Outpatient Medications Prior to Visit  Medication Sig Dispense Refill  . albuterol (VENTOLIN HFA) 108 (90 Base) MCG/ACT inhaler Inhale 1-2 puffs into the lungs every 6 (six) hours as needed for wheezing or shortness of breath. 1 each 0  . aspirin 81 MG tablet Take 81 mg by mouth daily.    . blood glucose meter kit and supplies KIT Dispense based on patient and insurance preference. Use up to four times daily as directed. (FOR ICD-9 250.00, 250.01). 1 each 0  . buPROPion (WELLBUTRIN XL) 150 MG 24 hr tablet Take 3 tablets by mouth daily.    . Calcium Carbonate-Vitamin D 600-400 MG-UNIT tablet Take 2 tablets 2 (two) times daily by mouth.     . carvedilol (COREG) 12.5 MG tablet Take 1.5 tablets (18.75 mg total) by mouth 2 (two) times daily. 270 tablet 3  . Cholecalciferol (VITAMIN D3) 3000 units TABS Take 1 tablet by mouth daily. 30 tablet   . cloNIDine (CATAPRES) 0.1 MG tablet Take 1 tablet (0.1 mg total) by mouth 2 (two) times daily. 180 tablet 1  . fluticasone (FLONASE) 50 MCG/ACT nasal spray Place 2 sprays into both nostrils daily. [place]  g 3  . glucose blood (ONETOUCH ULTRA) test strip Use as instructed to check blood sugar once daily. 100 each 3  . hydrALAZINE (APRESOLINE) 100 MG tablet Take 1 tablet (100 mg total) by mouth in the morning and at bedtime. 180 tablet 1  . Lancets (ONETOUCH ULTRASOFT) lancets Use to check blood sugar 3 times a day. 100 each 1  . metFORMIN (GLUCOPHAGE-XR) 750 MG 24 hr tablet TAKE 1 TABLET(750 MG) BY MOUTH TWICE DAILY WITH A MEAL 180 tablet 1  . Multiple Vitamins-Minerals (MULTIVITAMIN ADULTS 50+ PO) Take 1 tablet every morning by mouth.     .  olmesartan-hydrochlorothiazide (BENICAR HCT) 40-12.5 MG tablet TAKE 1 TABLET BY MOUTH DAILY 90 tablet 3  . pantoprazole (PROTONIX) 40 MG tablet TAKE 1 TABLET(40 MG) BY MOUTH EVERY MORNING 90 tablet 1  . Probiotic Product (PROBIOTIC DAILY PO) Take 1 capsule every morning by mouth.     . rosuvastatin (CRESTOR) 20 MG tablet Take 1 tablet (20 mg total) by mouth daily. 90 tablet 3  . azithromycin (ZITHROMAX) 250 MG tablet Take two tablets on day one followed by one tablet on days 2-5 6 each 0  . trimethoprim-polymyxin b (POLYTRIM) ophthalmic solution Place 1 drop into both eyes every 6 (six) hours. For 5 days 10 mL 0   No facility-administered medications prior to visit.     Per HPI unless specifically indicated in ROS section below Review of Systems Objective:  BP (!) 162/70   Pulse 65   Temp 97.7 F (36.5 C) (Temporal)   Ht '5\' 3"'  (1.6 m)   Wt 179 lb 2 oz (81.3 kg)   SpO2 96%   BMI 31.73 kg/m   Wt Readings from Last 3 Encounters:  10/21/20 179 lb 2 oz (81.3 kg)  10/12/20 178 lb (80.7 kg)  09/09/20 178 lb 9.6 oz (81 kg)      Physical Exam Vitals and nursing note reviewed.  Constitutional:      General: She is not in acute distress.    Appearance: Normal appearance. She is well-developed. She is obese. She is not ill-appearing.  HENT:     Head: Normocephalic and atraumatic.     Mouth/Throat:     Mouth: Mucous membranes are moist.     Pharynx: Oropharynx is clear. No oropharyngeal exudate or posterior oropharyngeal erythema.     Comments: Mild yellow residue midline tongue Eyes:     General: No scleral icterus.    Conjunctiva/sclera: Conjunctivae normal.     Pupils: Pupils are equal, round, and reactive to light.  Cardiovascular:     Rate and Rhythm: Normal rate and regular rhythm.     Pulses: Normal pulses.     Heart sounds: Normal heart sounds. No murmur heard.   Pulmonary:     Effort: Pulmonary effort is normal. No respiratory distress.     Breath sounds: Normal breath  sounds. No wheezing, rhonchi or rales.  Musculoskeletal:     Cervical back: Normal range of motion and neck supple.     Comments: See HPI for foot exam if done  Lymphadenopathy:     Cervical: No cervical adenopathy.  Skin:    General: Skin is warm and dry.     Findings: No rash.  Neurological:     Mental Status: She is alert.  Psychiatric:        Mood and Affect: Mood normal.        Behavior: Behavior normal.       Results for orders  placed or performed in visit on 10/21/20  POCT glycosylated hemoglobin (Hb A1C)  Result Value Ref Range   Hemoglobin A1C 6.2 (A) 4.0 - 5.6 %   HbA1c POC (<> result, manual entry)     HbA1c, POC (prediabetic range)     HbA1c, POC (controlled diabetic range)     Lab Results  Component Value Date   VITAMINB12 293 06/09/2018    Assessment & Plan:  This visit occurred during the SARS-CoV-2 public health emergency.  Safety protocols were in place, including screening questions prior to the visit, additional usage of staff PPE, and extensive cleaning of exam room while observing appropriate contact time as indicated for disinfecting solutions.   Problem List Items Addressed This Visit    Controlled diabetes mellitus type 2 with complications ([place]) - Primary    Chronic, overall stable based on A1c today although notes impaired fasting sugar control at home - will change metformin XR to 777m nightly, continue diabetic diet.  Check B12 levels next labwork for endorsed mental fogginess.       Relevant Orders   POCT glycosylated hemoglobin (Hb A1C) (Completed)   Essential hypertension, benign    BP elevated today - has not yet taken morning medications - she will take right when she gets home and monitor blood pressures closely throughout the day and let me know if persistenly elevated. I have asked her to bring in BP cuff to next visit to compare readings. She will let me know if BP consistently staying >140/90       Bowel habit changes    Overall  improved diarrhea,now with some constipation. Still suspect IBS - will trial OTC IBguard.       Tongue coating    Mild yellow residue to tongue - will Rx nystatin for possible thrush, update with effect.          Meds ordered this encounter  Medications  . nystatin (MYCOSTATIN) 100000 UNIT/ML suspension    Sig: Take 5 mLs (500,000 Units total) by mouth 3 (three) times daily.    Dispense:  120 mL    Refill:  0  . Peppermint Oil (IBGARD) 90 MG CPCR    Sig: Take 1 capsule by mouth 3 (three) times daily as needed.   Orders Placed This Encounter  Procedures  . POCT glycosylated hemoglobin (Hb A1C)    Patient Instructions  Blood pressures were too high today! Take BP meds when you get home, and check at home to ensure returning to normal. Bring in BP cuff to next visit. Let me know sooner if BP staying consistently >140/90.  Complete stool kit at home.  Try IBGuard 1 capsule daily up to 1 capsule with meals which may help possible IBS symptoms.  Change metformin XR 7530mto nightly.  Check sugars at different times - either fasting before a meal or 2 hours after a meal. Bring log to next visit.  Return in 4 months for physical.  Ok to try nystatin swish and swallow antifungal solution for next several days. We should check b12 levels next visit.   Follow up plan: Return in about 4 months (around 02/21/2021), or if symptoms worsen or fail to improve, for annual exam, prior fasting for blood work, medicare wellness visit.  JaRia BushMD

## 2020-10-21 NOTE — Patient Instructions (Addendum)
Blood pressures were too high today! Take BP meds when you get home, and check at home to ensure returning to normal. Bring in BP cuff to next visit. Let me know sooner if BP staying consistently >140/90.  Complete stool kit at home.  Try IBGuard 1 capsule daily up to 1 capsule with meals which may help possible IBS symptoms.  Change metformin XR 752m to nightly.  Check sugars at different times - either fasting before a meal or 2 hours after a meal. Bring log to next visit.  Return in 4 months for physical.  Ok to try nystatin swish and swallow antifungal solution for next several days. We should check b12 levels next visit.

## 2020-10-21 NOTE — Assessment & Plan Note (Signed)
Mild yellow residue to tongue - will Rx nystatin for possible thrush, update with effect.

## 2020-11-08 ENCOUNTER — Encounter: Payer: Self-pay | Admitting: Family Medicine

## 2020-11-08 MED ORDER — ALBUTEROL SULFATE HFA 108 (90 BASE) MCG/ACT IN AERS: 1.0000 | INHALATION_SPRAY | Freq: Four times a day (QID) | RESPIRATORY_TRACT | 0 refills | Status: DC | PRN

## 2020-11-08 NOTE — Telephone Encounter (Signed)
I'm seeing if pt can do home COVID swab and if negative then bring her in for OV this week.

## 2020-11-09 ENCOUNTER — Other Ambulatory Visit: Payer: Self-pay | Admitting: Psychiatry

## 2020-11-09 ENCOUNTER — Encounter: Payer: Self-pay | Admitting: Family Medicine

## 2020-11-09 ENCOUNTER — Ambulatory Visit (INDEPENDENT_AMBULATORY_CARE_PROVIDER_SITE_OTHER): Payer: PPO | Admitting: Family Medicine

## 2020-11-09 ENCOUNTER — Ambulatory Visit (INDEPENDENT_AMBULATORY_CARE_PROVIDER_SITE_OTHER)
Admission: RE | Admit: 2020-11-09 | Discharge: 2020-11-09 | Disposition: A | Discharge status: Home / Self Care | Payer: PPO | Source: Ambulatory Visit | Attending: Family Medicine | Admitting: Family Medicine

## 2020-11-09 VITALS — BP 120/76 | HR 69 | Temp 97.6°F | Ht 63.0 in | Wt 180.1 lb

## 2020-11-09 DIAGNOSIS — R059 Cough, unspecified: Secondary | ICD-10-CM

## 2020-11-09 DIAGNOSIS — Z87891 Personal history of nicotine dependence: Secondary | ICD-10-CM

## 2020-11-09 MED ORDER — PREDNISONE 20 MG PO TABS
ORAL_TABLET | ORAL | 0 refills | Status: DC
Start: 2020-11-09 — End: 2020-12-16

## 2020-11-09 MED ORDER — DOXYCYCLINE HYCLATE 100 MG PO TABS: 100.0000 mg | ORAL_TABLET | Freq: Two times a day (BID) | ORAL | 0 refills | Status: DC

## 2020-11-09 NOTE — Telephone Encounter (Signed)
Ok to add OV at 4:30 today?  Or schedule for another day?

## 2020-11-09 NOTE — Patient Instructions (Signed)
I think you may have COPD flare. Xray today  Take prednisone and antibiotic sent to pharmacy Let us know if not improving with this.  We may consider daily breathing medicine to help prevent future bronchitis flares.

## 2020-11-09 NOTE — Telephone Encounter (Signed)
Ok to add at 4:30pm. Thanks.

## 2020-11-09 NOTE — Progress Notes (Signed)
Patient ID: [name] [name], female    DOB: 10-31-1950, 70 y.o.   MRN: [id number]  This visit was conducted in person.  BP 120/76   Pulse 69   Temp 97.6 F (36.4 C) (Temporal)   Ht _0  (1.6 m)   Wt 180 lb 1 oz (81.7 kg)   SpO2 95%   BMI 31.90 kg/m    CC: cough  Subjective:   HPI: [name] [name] is a 70 y.o. female presenting on 11/09/2020 for Cough (Previously seen and treated for cough.  Sxs had improved but is now back. )   See prior notes for details.  Seen at the beginning of the month with congestion, rhinorrhea, PNdrainage, productive cough of colored sputum. No fevers. Treated for presumed bronchitis with azithromycin course with improvement but cough never fully went away. She also since has taken corcedin cough/cold with some benefit.   3d ago cough returned and worse - nagging tickle in the back of the throat that quickly recurs. Cough is productive of yellow sputum. ST this morning. Treating with cough drop.  Around sick grandsons recently.   No fevers, wheezing, dyspnea, or abd pain, nausea, ear or tooth pain, hoarseness.   Negative COVID test at home today.  She did have COVID illness 05/2020, symptoms fully recovered  No h/o allergic rhinitis.  GERD - well managed on pantoprazole daily.  Well controlled diabetic.  No known asthma or COPD history. Ex smoker - quit at least 5 yrs ago "prone to bronchitis" ~50PY hx Albuterol previously prescribed by [facility] has been helpful      Relevant past medical, surgical, family and social history reviewed and updated as indicated. Interim medical history since our last visit reviewed. Allergies and medications reviewed and updated. Outpatient Medications Prior to Visit  Medication Sig Dispense Refill  . albuterol (VENTOLIN HFA) 108 (90 Base) MCG/ACT inhaler Inhale 1-2 puffs into the lungs every 6 (six) hours as needed for wheezing or shortness of breath. 1 each 0  . aspirin 81 MG tablet Take 81 mg by mouth daily.    .  blood glucose meter kit and supplies KIT Dispense based on patient and insurance preference. Use up to four times daily as directed. (FOR ICD-9 250.00, 250.01). 1 each 0  . buPROPion (WELLBUTRIN XL) 150 MG 24 hr tablet Take 3 tablets by mouth daily.    . Calcium Carbonate-Vitamin D 600-400 MG-UNIT tablet Take 2 tablets 2 (two) times daily by mouth.     . carvedilol (COREG) 12.5 MG tablet Take 1.5 tablets (18.75 mg total) by mouth 2 (two) times daily. 270 tablet 3  . Cholecalciferol (VITAMIN D3) 3000 units TABS Take 1 tablet by mouth daily. 30 tablet   . cloNIDine (CATAPRES) 0.1 MG tablet Take 1 tablet (0.1 mg total) by mouth 2 (two) times daily. 180 tablet 1  . fluticasone (FLONASE) 50 MCG/ACT nasal spray Place 2 sprays into both nostrils daily. 16 g 3  . glucose blood (ONETOUCH ULTRA) test strip Use as instructed to check blood sugar once daily. 100 each 3  . hydrALAZINE (APRESOLINE) 100 MG tablet Take 1 tablet (100 mg total) by mouth in the morning and at bedtime. 180 tablet 1  . Lancets (ONETOUCH ULTRASOFT) lancets Use to check blood sugar 3 times a day. 100 each 1  . metFORMIN (GLUCOPHAGE-XR) 750 MG 24 hr tablet TAKE 1 TABLET(750 MG) BY MOUTH TWICE DAILY WITH A MEAL 180 tablet 1  . Multiple Vitamins-Minerals (MULTIVITAMIN ADULTS 50+  PO) Take 1 tablet every morning by mouth.     . nystatin (MYCOSTATIN) 100000 UNIT/ML suspension Take 5 mLs (500,000 Units total) by mouth 3 (three) times daily. 120 mL 0  . olmesartan-hydrochlorothiazide (BENICAR HCT) 40-12.5 MG tablet TAKE 1 TABLET BY MOUTH DAILY 90 tablet 3  . pantoprazole (PROTONIX) 40 MG tablet TAKE 1 TABLET(40 MG) BY MOUTH EVERY MORNING 90 tablet 1  . Peppermint Oil (IBGARD) 90 MG CPCR Take 1 capsule by mouth 3 (three) times daily as needed.    . Probiotic Product (PROBIOTIC DAILY PO) Take 1 capsule every morning by mouth.     . rosuvastatin (CRESTOR) 20 MG tablet Take 1 tablet (20 mg total) by mouth daily. 90 tablet 3   No  facility-administered medications prior to visit.     Per HPI unless specifically indicated in ROS section below Review of Systems Objective:  BP 120/76   Pulse 69   Temp 97.6 F (36.4 C) (Temporal)   Ht _0  (1.6 m)   Wt 180 lb 1 oz (81.7 kg)   SpO2 95%   BMI 31.90 kg/m   Wt Readings from Last 3 Encounters:  11/09/20 180 lb 1 oz (81.7 kg)  10/21/20 179 lb 2 oz (81.3 kg)  10/12/20 178 lb (80.7 kg)      Physical Exam Vitals and nursing note reviewed.  Constitutional:      Appearance: Normal appearance.  Eyes:     Extraocular Movements: Extraocular movements intact.     Pupils: Pupils are equal, round, and reactive to light.  Cardiovascular:     Rate and Rhythm: Normal rate and regular rhythm.     Pulses: Normal pulses.     Heart sounds: Normal heart sounds. No murmur heard.   Pulmonary:     Effort: Pulmonary effort is normal. No respiratory distress.     Breath sounds: Normal breath sounds. No wheezing, rhonchi or rales.     Comments: Lungs largely clear Skin:    General: Skin is warm and dry.     Findings: No rash.  Neurological:     Mental Status: She is alert.  Psychiatric:        Mood and Affect: Mood normal.        Behavior: Behavior normal.       Results for orders placed or performed in visit on 10/21/20  POCT glycosylated hemoglobin (Hb A1C)  Result Value Ref Range   Hemoglobin A1C 6.2 (A) 4.0 - 5.6 %   HbA1c POC (<> result, manual entry)     HbA1c, POC (prediabetic range)     HbA1c, POC (controlled diabetic range)     Assessment & Plan:  This visit occurred during the SARS-CoV-2 public health emergency.  Safety protocols were in place, including screening questions prior to the visit, additional usage of staff PPE, and extensive cleaning of exam room while observing appropriate contact time as indicated for disinfecting solutions.   Problem List Items Addressed This Visit    Cough - Primary    Possible recurrent bronchitis. In smoking history,  possible chronic bronchitis/COPD exacerbation. Will treat with short prednisone taper and doxycycline antibiotic course. Given duration of cough, check CXR today. Consider controller respiratory med for possible COPD. Discussed abx precautions.       Relevant Orders   DG Chest 2 View   Ex-smoker    Quit about 5 yrs ago, endorses ~50PY hx.  She would be interested in lung cancer screening - referral placed.  Relevant Orders   Ambulatory Referral for Lung Cancer Scre       Meds ordered this encounter  Medications  . predniSONE (DELTASONE) 20 MG tablet    Sig: Take two tablets daily for 3 days followed by one tablet daily for 4 days    Dispense:  10 tablet    Refill:  0  . doxycycline (VIBRA-TABS) 100 MG tablet    Sig: Take 1 tablet (100 mg total) by mouth 2 (two) times daily.    Dispense:  14 tablet    Refill:  0   Orders Placed This Encounter  Procedures  . DG Chest 2 View    Standing Status:   Future    Number of Occurrences:   1    Standing Expiration Date:   11/09/2021    Order Specific Question:   Reason for Exam (SYMPTOM  OR DIAGNOSIS REQUIRED)    Answer:   cough, smoker    Order Specific Question:   Preferred imaging location?    Answer:   [facility]  . Ambulatory Referral for Lung Cancer Scre    Referral Priority:   Routine    Referral Type:   Consultation    Referral Reason:   Specialty Services Required    Number of Visits Requested:   1    Patient Instructions  I think you may have COPD flare. Xray today  Take prednisone and antibiotic sent to pharmacy Let us know if not improving with this.  We may consider daily breathing medicine to help prevent future bronchitis flares.    Follow up plan: Return if symptoms worsen or fail to improve.  Ria Bush, MD

## 2020-11-09 NOTE — Assessment & Plan Note (Signed)
Quit about 5 yrs ago, endorses ~50PY hx.  She would be interested in lung cancer screening - referral placed.

## 2020-11-09 NOTE — Assessment & Plan Note (Addendum)
Possible recurrent bronchitis. In smoking history, possible chronic bronchitis/COPD exacerbation. Will treat with short prednisone taper and doxycycline antibiotic course. Given duration of cough, check CXR today. Consider controller respiratory med for possible COPD. Discussed abx precautions.

## 2020-11-10 ENCOUNTER — Other Ambulatory Visit: Payer: Self-pay | Admitting: *Deleted

## 2020-11-10 MED ORDER — ROSUVASTATIN CALCIUM 20 MG PO TABS: 20.0000 mg | ORAL_TABLET | Freq: Every day | ORAL | 0 refills | Status: DC

## 2020-11-10 NOTE — Telephone Encounter (Signed)
Pt left a message for a refill on the wellbutrin xl 150 mg . Next appt in december

## 2020-11-28 ENCOUNTER — Other Ambulatory Visit: Payer: Self-pay

## 2020-11-28 NOTE — Telephone Encounter (Signed)
LAST APPOINTMENT DATE: 11/09/2020 cough Dr. Darnell Level      11/08/2020 DM follow up  Dr. Darnell Level   NEXT APPOINTMENT DATE: 02/28/2021 Dr. Darnell Level     LAST REFILL: Carlean Purl 05/30/2020    QTY: #90 1 rf

## 2020-11-29 MED ORDER — PANTOPRAZOLE SODIUM 40 MG PO TBEC: DELAYED_RELEASE_TABLET | ORAL | 1 refills | Status: DC

## 2020-11-29 NOTE — Telephone Encounter (Signed)
  LAST APPOINTMENT DATE: 11/09/2020   NEXT APPOINTMENT DATE:@9 /13/2022  MEDICATION: pantoprazole (PROTONIX) 40 MG tablet  PHARMACY: [facility] [address]   Let patient know to contact pharmacy at the end of the day to make sure medication is ready.  Please notify patient to allow 48-72 hours to process  Encourage patient to contact the pharmacy for refills or they can request refills through [facility]:   LAST REFILL:  QTY:  REFILL DATE:    OTHER COMMENTS:    Okay for refill?  Please advise

## 2020-12-06 ENCOUNTER — Telehealth: Payer: Self-pay | Admitting: *Deleted

## 2020-12-06 DIAGNOSIS — Z87891 Personal history of nicotine dependence: Secondary | ICD-10-CM

## 2020-12-06 DIAGNOSIS — Z122 Encounter for screening for malignant neoplasm of respiratory organs: Secondary | ICD-10-CM

## 2020-12-06 NOTE — Telephone Encounter (Signed)
Received referral for low dose lung cancer screening CT scan. Message left at phone number listed in EMR for patient to call me back to facilitate scheduling scan.  

## 2020-12-13 ENCOUNTER — Encounter: Payer: Self-pay | Admitting: *Deleted

## 2020-12-13 NOTE — Addendum Note (Signed)
Addended by: Lieutenant Diego on: 12/13/2020 12:13 PM   Modules accepted: Orders

## 2020-12-13 NOTE — Telephone Encounter (Signed)
Received referral for initial lung cancer screening scan. Contacted patient and obtained smoking history,(former, quit 09/23/12, 72 pack year) as well as answering questions related to screening process. Patient denies signs of lung cancer such as weight loss or hemoptysis. Patient denies comorbidity that would prevent curative treatment if lung cancer were found. Patient is scheduled for shared decision making visit and CT scan on 12/27/20 at 130pm.

## 2020-12-16 ENCOUNTER — Encounter: Payer: Self-pay | Admitting: Family Medicine

## 2020-12-16 ENCOUNTER — Telehealth: Payer: Self-pay

## 2020-12-16 ENCOUNTER — Ambulatory Visit (INDEPENDENT_AMBULATORY_CARE_PROVIDER_SITE_OTHER): Payer: PPO | Admitting: Family Medicine

## 2020-12-16 ENCOUNTER — Other Ambulatory Visit: Payer: Self-pay

## 2020-12-16 VITALS — BP 124/66 | HR 71 | Temp 98.1°F | Ht 63.0 in | Wt 179.1 lb

## 2020-12-16 DIAGNOSIS — K219 Gastro-esophageal reflux disease without esophagitis: Secondary | ICD-10-CM

## 2020-12-16 DIAGNOSIS — R070 Pain in throat: Secondary | ICD-10-CM | POA: Diagnosis not present

## 2020-12-16 MED ORDER — ALUM & MAG HYDROXIDE-SIMETH 200-200-20 MG/5ML PO SUSP
10.0000 mL | Freq: Once | ORAL | Status: DC
Start: 2020-12-16 — End: 2021-06-14

## 2020-12-16 NOTE — Telephone Encounter (Signed)
[facility] - Client TELEPHONE ADVICE RECORD [facility] Patient Name: [name] [name] Gender: Female DOB: 09/22/1950 Age: 70 Y 2 M 24 D Return Phone Number: [phone] (Primary) Address: City/ State/ Zip: [place] [address] Client [facility] - Client Client Site [place] Physician Ria Bush - MD Contact Type Call Who Is Calling Patient / Member / Family / Caregiver Call Type Triage / Clinical Relationship To Patient Self Return Phone Number [phone] (Primary) Chief Complaint Vomiting Reason for Call Symptomatic / Request for [place] states, pt was triaged and missed phone call. Pt has indigestion and vomited in mouth. Translation No Nurse Assessment Nurse: Rolin Barry, RN, Levada Dy Date/Time (Eastern Time): 12/16/2020 10:11:11 AM Confirm and document reason for call. If symptomatic, describe symptoms. ---Caller advised that she has had diarrhea for a couple of days. States that she has indigestion. States that she also has a cough. No temp. Never vomited. Woke up with throat burning , up to ears. Throat sore. Does the patient have any new or worsening symptoms? ---Yes Will a triage be completed? ---Yes Related visit to physician within the last 2 weeks? ---No Does the PT have any chronic conditions? (i.e. diabetes, asthma, this includes High risk factors for pregnancy, etc.) ---Yes List chronic conditions. ---Diabetes Aid reflux, pantoprazole HTN Advised that she is scheduled for a lung scan screening. Has had bronchitis. Is this a behavioral health or substance abuse call? ---No Guidelines Guideline Title Affirmed Question Affirmed Notes Nurse Date/Time (Eastern Time) Cough - Acute Productive [1] MILD difficulty breathing (e.g., minimal/no SOB at rest, SOB with walking, pulse <100) AND [2] still present when not coughing Deaton, RN,  Levada Dy 12/16/2020 10:14:42 AM PLEASE NOTE: All timestamps contained within this report are represented as [place] Standard Time. CONFIDENTIALTY NOTICE: This fax transmission is intended only for the addressee. It contains information that is legally privileged, confidential or otherwise protected from use or disclosure. If you are not the intended recipient, you are strictly prohibited from reviewing, disclosing, copying using or disseminating any of this information or taking any action in reliance on or regarding this information. If you have received this fax in error, please notify us immediately by telephone so that we can arrange for its return to [place]. Phone: [phone], Toll-Free: [phone], Fax: [phone] Page: 2 of 2 Call Id: [id number] [place]. Time [name] Time) Disposition Final User 12/16/2020 10:16:43 AM See HCP within 4 Hours (or PCP triage) Yes Deaton, RN, Cindee Lame Disagree/Comply Comply Caller Understands Yes PreDisposition Did not know what to do Care Advice Given Per Guideline SEE HCP (OR PCP TRIAGE) WITHIN 4 HOURS: * IF OFFICE WILL BE OPEN: You need to be seen within the next 3 or 4 hours. Call your doctor (or NP/PA) now or as soon as the office opens. CALL BACK IF: * You become worse CARE ADVICE given per Cough - Acute Productive (Adult) guideline. Comments User: Saverio Danker, RN Date/Time [name] Time): 12/16/2020 10:21:05 AM [facility] was contacted. Spoke with Raquel Sarna. No appts available. caller made aware, advised that she will go to an UC. Raquel Sarna made aware. Referrals Warm transfer to [facility]

## 2020-12-16 NOTE — Telephone Encounter (Signed)
[facility] - Client TELEPHONE ADVICE RECORD [facility] Patient Name: [name] [name] Gender: Female DOB: June 03, 1951 Age: 70 Y 10 M 24 D Return Phone Number: [phone] (Primary) Address: City/ State/ Zip: [place] [address] Client  [facility] - Client Client Site [facility] Physician Ria Bush - MD Contact Type Call Who Is Calling Patient / Member / Family / Caregiver Call Type Triage / Clinical Relationship To Patient Self Return Phone Number [phone] (Primary) Chief Complaint Sore Throat Reason for Call Symptomatic / Request for Health Information Initial Comment Caller has sore throat, burning that may be acid reflux. Now she has a cough Translation No Disp. Time [name] Time) Disposition Final User 12/16/2020 9:47:24 AM Attempt made - message left Hammonds, RN, Lissa 12/16/2020 10:00:11 AM Attempt made - no message left Hammonds, RN, Lissa 12/16/2020 10:00:57 AM FINAL ATTEMPT MADE - message left Yes Hammonds, RN, Epifanio Lesches

## 2020-12-16 NOTE — Telephone Encounter (Signed)
Patient called stating that she had a very intense episode of indigestion this morning. It woke her up, she states it was like she "regurgitated" in her mouth. She has indigestion issues and takes Protonix, has been on this for years, but she has never had such a severe episode before. This morning she has pain in the back of her throat radiating to her ears. No vomiting or chest pain present but this was a new and severe episode. I sent patient to access nurse to triage to make sure this is not something more.  We do not have any openings today here or other [place] offices.

## 2020-12-16 NOTE — Telephone Encounter (Signed)
Doesn't sound like COVID. Could see her at 12:45pm if pt able to come in.  Would also have her start pepcid 20mg  nightly.

## 2020-12-16 NOTE — Assessment & Plan Note (Signed)
Recent GI upset anticipate either viral gastroenteritis or possibly dietary related indigestion. Treated with mintox antacid solution in office - with signifciant benefit. Reassurance provided. Anticipate self limited illness. Update if recurrent symptoms develop.

## 2020-12-16 NOTE — Telephone Encounter (Addendum)
I spoke with pt and she is appreciative for appt with Dr Darnell Level today at 12:45. Per pt no covid symptoms or known exposure. Barbee Shropshire will add pt to Dr Synthia Innocent schedule. Sending FYI to Dr Darnell Level and Lattie Haw CMA.

## 2020-12-16 NOTE — Telephone Encounter (Signed)
Noted  

## 2020-12-16 NOTE — Telephone Encounter (Signed)
I left v/m (DPR signed) just wanting to verify that pt was going or had gone to an UC for eval. Requesting pt to cb with update. Per access nurse note was noted that pt did agree to comply with AN advice and pt was going to UC.sending note to Dr Baldwin Crown CMA.

## 2020-12-16 NOTE — Assessment & Plan Note (Signed)
Continue protonix 40mg  daily, add pepcid nightly x1wk

## 2020-12-16 NOTE — Progress Notes (Signed)
Patient ID: [name] [name], female    DOB: Feb 25, 1951, 70 y.o.   MRN: [id number]  This visit was conducted in person.  BP 124/66   Pulse 71   Temp 98.1 F (36.7 C) (Temporal)   Ht '5\' 3"'  (1.6 m)   Wt 179 lb 2 oz (81.3 kg)   SpO2 96%   BMI 31.73 kg/m    CC: burning in throat Subjective:   HPI: [name] [name] is a 70 y.o. female presenting on 12/16/2020 for Sore Throat (C/o burning in throat and cough.  Sxs started about 3:00 this morning.  Woke pt out of her sleep.)   2-3d h/o watery diarrhea a few times a day associated with upper abdominal discomfort (without boring pain to the back). Woke up this morning at 3am with burning throat pain due to acid regurgitation. Very uncomfortable.  Tums helped. Slept rest of the night in recliner.  New cough thought due to throat irritation.  Decreased appetite recently, light eating recently - had eaten chicken noodle soup for dinner.  No spicy foods recently.  No recent increased NSAID use, caffeine, alcohol. No recent diet changes.  No fevers, nausea/vomiting, dysphagia, or early satiety. No blood or coffee ground emesis.  Drinks bottled water. No new restaurants or foods.  Diarrhea seems to have resolved as of today.   Known GERD previously well managed with pantoprazole 32m daily. Also takes IBGard 976mTID PRN IBS and probiotic daily.  Recent prednisone and doxycycline course last month.  Regularly takes aspirin 8159maily.   Asks about [employer] diet      Relevant past medical, surgical, family and social history reviewed and updated as indicated. Interim medical history since our last visit reviewed. Allergies and medications reviewed and updated. Outpatient Medications Prior to Visit  Medication Sig Dispense Refill   albuterol (VENTOLIN HFA) 108 (90 Base) MCG/ACT inhaler Inhale 1-2 puffs into the lungs every 6 (six) hours as needed for wheezing or shortness of breath. 1 each 0   aspirin 81 MG tablet Take 81 mg by mouth daily.      blood glucose meter kit and supplies KIT Dispense based on patient and insurance preference. Use up to four times daily as directed. (FOR ICD-9 250.00, 250.01). 1 each 0   buPROPion (WELLBUTRIN XL) 150 MG 24 hr tablet TAKE 3 TABLETS(450 MG) BY MOUTH EVERY MORNING 90 tablet 1   Calcium Carbonate-Vitamin D 600-400 MG-UNIT tablet Take 2 tablets 2 (two) times daily by mouth.      carvedilol (COREG) 12.5 MG tablet Take 1.5 tablets (18.75 mg total) by mouth 2 (two) times daily. 270 tablet 3   Cholecalciferol (VITAMIN D3) 3000 units TABS Take 1 tablet by mouth daily. 30 tablet    cloNIDine (CATAPRES) 0.1 MG tablet Take 1 tablet (0.1 mg total) by mouth 2 (two) times daily. 180 tablet 1   fluticasone (FLONASE) 50 MCG/ACT nasal spray Place 2 sprays into both nostrils daily. 16 g 3   glucose blood (ONETOUCH ULTRA) test strip Use as instructed to check blood sugar once daily. 100 each 3   hydrALAZINE (APRESOLINE) 100 MG tablet Take 1 tablet (100 mg total) by mouth in the morning and at bedtime. 180 tablet 1   Lancets (ONETOUCH ULTRASOFT) lancets Use to check blood sugar 3 times a day. 100 each 1   metFORMIN (GLUCOPHAGE-XR) 750 MG 24 hr tablet TAKE 1 TABLET(750 MG) BY MOUTH TWICE DAILY WITH A MEAL 180 tablet 1   Multiple Vitamins-Minerals (  MULTIVITAMIN ADULTS 50+ PO) Take 1 tablet every morning by mouth.      nystatin (MYCOSTATIN) 100000 UNIT/ML suspension Take 5 mLs (500,000 Units total) by mouth 3 (three) times daily. 120 mL 0   olmesartan-hydrochlorothiazide (BENICAR HCT) 40-12.5 MG tablet TAKE 1 TABLET BY MOUTH DAILY 90 tablet 3   pantoprazole (PROTONIX) 40 MG tablet TAKE 1 TABLET(40 MG) BY MOUTH EVERY MORNING 90 tablet 1   Peppermint Oil (IBGARD) 90 MG CPCR Take 1 capsule by mouth 3 (three) times daily as needed.     Probiotic Product (PROBIOTIC DAILY PO) Take 1 capsule every morning by mouth.      rosuvastatin (CRESTOR) 20 MG tablet Take 1 tablet (20 mg total) by mouth daily. 90 tablet 0   doxycycline  (VIBRA-TABS) 100 MG tablet Take 1 tablet (100 mg total) by mouth 2 (two) times daily. 14 tablet 0   predniSONE (DELTASONE) 20 MG tablet Take two tablets daily for 3 days followed by one tablet daily for 4 days 10 tablet 0   No facility-administered medications prior to visit.     Per HPI unless specifically indicated in ROS section below Review of Systems  Objective:  BP 124/66   Pulse 71   Temp 98.1 F (36.7 C) (Temporal)   Ht '5\' 3"'  (1.6 m)   Wt 179 lb 2 oz (81.3 kg)   SpO2 96%   BMI 31.73 kg/m   Wt Readings from Last 3 Encounters:  12/16/20 179 lb 2 oz (81.3 kg)  11/09/20 180 lb 1 oz (81.7 kg)  10/21/20 179 lb 2 oz (81.3 kg)      Physical Exam Vitals and nursing note reviewed.  Constitutional:      Appearance: Normal appearance. She is not ill-appearing.  HENT:     Head: Normocephalic and atraumatic.     Mouth/Throat:     Mouth: Mucous membranes are moist.     Pharynx: Oropharynx is clear. No oropharyngeal exudate or posterior oropharyngeal erythema.  Cardiovascular:     Rate and Rhythm: Normal rate and regular rhythm.     Pulses: Normal pulses.     Heart sounds: Normal heart sounds. No murmur heard. Pulmonary:     Effort: Pulmonary effort is normal. No respiratory distress.     Breath sounds: Normal breath sounds. No wheezing, rhonchi or rales.  Abdominal:     General: Bowel sounds are normal. There is no distension.     Palpations: Abdomen is soft. There is no mass.     Tenderness: There is no abdominal tenderness. There is no right CVA tenderness, left CVA tenderness, guarding or rebound.     Hernia: No hernia is present.  Musculoskeletal:     Right lower leg: No edema.     Left lower leg: No edema.  Skin:    Findings: No rash.  Neurological:     Mental Status: She is alert.  Psychiatric:        Mood and Affect: Mood normal.        Behavior: Behavior normal.      Results for orders placed or performed in visit on 10/21/20  POCT glycosylated hemoglobin  (Hb A1C)  Result Value Ref Range   Hemoglobin A1C 6.2 (A) 4.0 - 5.6 %   HbA1c POC (<> result, manual entry)     HbA1c, POC (prediabetic range)     HbA1c, POC (controlled diabetic range)      Assessment & Plan:  This visit occurred during the SARS-CoV-2 public health emergency.  Safety protocols were in place, including screening questions prior to the visit, additional usage of staff PPE, and extensive cleaning of exam room while observing appropriate contact time as indicated for disinfecting solutions.   Problem List Items Addressed This Visit     GERD    Continue protonix 88m daily, add pepcid nightly x1wk       Relevant Medications   alum & mag hydroxide-simeth (MAALOX/MYLANTA) 200-200-20 MG/5ML suspension 10 mL (Start on 12/16/2020  2:00 PM)   Throat burning - Primary    Recent GI upset anticipate either viral gastroenteritis or possibly dietary related indigestion. Treated with mintox antacid solution in office - with signifciant benefit. Reassurance provided. Anticipate self limited illness. Update if recurrent symptoms develop.        Relevant Medications   alum & mag hydroxide-simeth (MAALOX/MYLANTA) 200-200-20 MG/5ML suspension 10 mL (Start on 12/16/2020  2:00 PM)     Meds ordered this encounter  Medications   alum & mag hydroxide-simeth (MAALOX/MYLANTA) 200-200-20 MG/5ML suspension 10 mL    No orders of the defined types were placed in this encounter.   Patient Instructions  Antacid provided today.  Continue protonix 477mdaily. May add pepcid (famotidine) 2058mt night time for next 1 week.  Let us [place] if recurrent.   Follow up plan: Return if symptoms worsen or fail to improve.  JavRia BushD

## 2020-12-16 NOTE — Patient Instructions (Signed)
Antacid provided today.  Continue protonix 40mg  daily. May add pepcid (famotidine) 20mg  at night time for next 1 week.  Let us know if recurrent.

## 2020-12-27 ENCOUNTER — Inpatient Hospital Stay: Payer: PPO | Attending: Oncology | Admitting: Hospice and Palliative Medicine

## 2020-12-27 ENCOUNTER — Other Ambulatory Visit: Payer: Self-pay

## 2020-12-27 ENCOUNTER — Ambulatory Visit
Admission: RE | Admit: 2020-12-27 | Discharge: 2020-12-27 | Disposition: A | Discharge status: Home / Self Care | Payer: PPO | Source: Ambulatory Visit | Attending: Nurse Practitioner | Admitting: Nurse Practitioner

## 2020-12-27 DIAGNOSIS — Z122 Encounter for screening for malignant neoplasm of respiratory organs: Secondary | ICD-10-CM | POA: Diagnosis not present

## 2020-12-27 DIAGNOSIS — Z87891 Personal history of nicotine dependence: Secondary | ICD-10-CM | POA: Diagnosis not present

## 2020-12-27 NOTE — Progress Notes (Signed)
Virtual Visit via Telephone Note  I connected withNAME@ on 12/27/20 by telephone and verified that I am speaking with the correct person using two identifiers.   I discussed the limitations of evaluation and management by telemedicine and the availability of in person appointments. The patient expressed understanding and agreed to proceed.  Location: Patient: [facility] Provider: Clinic   In accordance with CMS guidelines, patient has met eligibility criteria including age, absence of signs or symptoms of lung cancer.  Social History   Tobacco Use   Smoking status: Former    Packs/day: 1.50    Years: 48.00    Pack years: 72.00    Types: Cigarettes    Quit date: 09/23/2012    Years since quitting: 8.2   Smokeless tobacco: Never  Vaping Use   Vaping Use: Never used  Substance Use Topics   Alcohol use: Yes    Alcohol/week: 0.0 standard drinks    Comment: occasional wine   Drug use: No      A shared decision-making session was conducted prior to the performance of CT scan. This includes one or more decision aids, includes benefits and harms of screening, follow-up diagnostic testing, over-diagnosis, false positive rate, and total radiation exposure.   Counseling on the importance of adherence to annual lung cancer LDCT screening, impact of co-morbidities, and ability or willingness to undergo diagnosis and treatment is imperative for compliance of the program.   Counseling on the importance of continued smoking cessation for former smokers; the importance of smoking cessation for current smokers, and information about tobacco cessation interventions have been given to patient including [place] and [facility] quit [place] programs.   Written order for lung cancer screening with LDCT has been given to the patient and any and all questions have been answered to the best of my abilities.    Yearly follow up will be coordinated by Burgess Estelle, Thoracic Navigator.  Time Total: 5  minutes  Visit consisted of counseling and education dealing with complex health screening. Greater than 50%  of this time was spent counseling and coordinating care related to the above assessment and plan.  Signed by: Altha Harm, PhD, NP-C

## 2020-12-30 ENCOUNTER — Encounter: Payer: Self-pay | Admitting: *Deleted

## 2021-01-04 DIAGNOSIS — G4733 Obstructive sleep apnea (adult) (pediatric): Secondary | ICD-10-CM | POA: Diagnosis not present

## 2021-01-08 ENCOUNTER — Other Ambulatory Visit: Payer: Self-pay | Admitting: Psychiatry

## 2021-01-16 ENCOUNTER — Other Ambulatory Visit: Payer: Self-pay | Admitting: Physician Assistant

## 2021-02-02 ENCOUNTER — Other Ambulatory Visit: Payer: Self-pay

## 2021-02-02 ENCOUNTER — Ambulatory Visit (INDEPENDENT_AMBULATORY_CARE_PROVIDER_SITE_OTHER): Payer: PPO | Admitting: Bariatrics

## 2021-02-02 ENCOUNTER — Encounter (INDEPENDENT_AMBULATORY_CARE_PROVIDER_SITE_OTHER): Payer: Self-pay | Admitting: Bariatrics

## 2021-02-02 VITALS — BP 146/86 | Temp 97.8°F | Ht 63.0 in | Wt 177.0 lb

## 2021-02-02 DIAGNOSIS — Z6831 Body mass index (BMI) 31.0-31.9, adult: Secondary | ICD-10-CM

## 2021-02-02 DIAGNOSIS — R809 Proteinuria, unspecified: Secondary | ICD-10-CM | POA: Diagnosis not present

## 2021-02-02 DIAGNOSIS — R0602 Shortness of breath: Secondary | ICD-10-CM | POA: Diagnosis not present

## 2021-02-02 DIAGNOSIS — R5383 Other fatigue: Secondary | ICD-10-CM | POA: Diagnosis not present

## 2021-02-02 DIAGNOSIS — E1169 Type 2 diabetes mellitus with other specified complication: Secondary | ICD-10-CM

## 2021-02-02 DIAGNOSIS — G4733 Obstructive sleep apnea (adult) (pediatric): Secondary | ICD-10-CM | POA: Diagnosis not present

## 2021-02-02 DIAGNOSIS — E7849 Other hyperlipidemia: Secondary | ICD-10-CM | POA: Diagnosis not present

## 2021-02-02 DIAGNOSIS — K760 Fatty (change of) liver, not elsewhere classified: Secondary | ICD-10-CM

## 2021-02-02 DIAGNOSIS — I10 Essential (primary) hypertension: Secondary | ICD-10-CM

## 2021-02-02 DIAGNOSIS — Z0289 Encounter for other administrative examinations: Secondary | ICD-10-CM

## 2021-02-02 DIAGNOSIS — Z1331 Encounter for screening for depression: Secondary | ICD-10-CM

## 2021-02-02 DIAGNOSIS — E669 Obesity, unspecified: Secondary | ICD-10-CM | POA: Diagnosis not present

## 2021-02-02 DIAGNOSIS — E559 Vitamin D deficiency, unspecified: Secondary | ICD-10-CM | POA: Diagnosis not present

## 2021-02-02 DIAGNOSIS — Z9989 Dependence on other enabling machines and devices: Secondary | ICD-10-CM | POA: Diagnosis not present

## 2021-02-02 DIAGNOSIS — E785 Hyperlipidemia, unspecified: Secondary | ICD-10-CM | POA: Diagnosis not present

## 2021-02-03 ENCOUNTER — Other Ambulatory Visit: Payer: Self-pay | Admitting: Cardiology

## 2021-02-03 DIAGNOSIS — E7800 Pure hypercholesterolemia, unspecified: Secondary | ICD-10-CM

## 2021-02-03 LAB — COMPREHENSIVE METABOLIC PANEL
ALT: 18 IU/L (ref 0–32)
AST: 18 IU/L (ref 0–40)
Albumin/Globulin Ratio: 2 (ref 1.2–2.2)
Albumin: 4.3 g/dL (ref 3.7–4.7)
Alkaline Phosphatase: 64 IU/L (ref 44–121)
BUN/Creatinine Ratio: 17 (ref 12–28)
BUN: 14 mg/dL (ref 8–27)
Bilirubin Total: 0.5 mg/dL (ref 0.0–1.2)
CO2: 27 mmol/L (ref 20–29)
Calcium: 9.6 mg/dL (ref 8.7–10.3)
Chloride: 104 mmol/L (ref 96–106)
Creatinine, Ser: 0.82 mg/dL (ref 0.57–1.00)
Globulin, Total: 2.2 g/dL (ref 1.5–4.5)
Glucose: 141 mg/dL — ABNORMAL HIGH (ref 65–99)
Potassium: 3.9 mmol/L (ref 3.5–5.2)
Sodium: 145 mmol/L — ABNORMAL HIGH (ref 134–144)
Total Protein: 6.5 g/dL (ref 6.0–8.5)
eGFR: 76 mL/min/{1.73_m2} (ref >59)

## 2021-02-03 LAB — LIPID PANEL WITH LDL/HDL RATIO
Cholesterol, Total: 101 mg/dL (ref 100–199)
HDL: 49 mg/dL (ref >39)
LDL Chol Calc (NIH): 31 mg/dL (ref 0–99)
LDL/HDL Ratio: 0.6 ratio (ref 0.0–3.2)
Triglycerides: 117 mg/dL (ref 0–149)
VLDL Cholesterol Cal: 21 mg/dL (ref 5–40)

## 2021-02-03 LAB — T4, FREE: Free T4: 1.23 ng/dL (ref 0.82–1.77)

## 2021-02-03 LAB — INSULIN, RANDOM: INSULIN: 26.1 u[IU]/mL — ABNORMAL HIGH (ref 2.6–24.9)

## 2021-02-03 LAB — VITAMIN D 25 HYDROXY (VIT D DEFICIENCY, FRACTURES): Vit D, 25-Hydroxy: 62.4 ng/mL (ref 30.0–100.0)

## 2021-02-03 LAB — TSH: TSH: 1.05 u[IU]/mL (ref 0.450–4.500)

## 2021-02-03 LAB — T3: T3, Total: 122 ng/dL (ref 71–180)

## 2021-02-03 LAB — HEMOGLOBIN A1C
Est. average glucose Bld gHb Est-mCnc: 131 mg/dL
Hgb A1c MFr Bld: 6.2 % — ABNORMAL HIGH (ref 4.8–5.6)

## 2021-02-06 ENCOUNTER — Encounter (INDEPENDENT_AMBULATORY_CARE_PROVIDER_SITE_OTHER): Payer: Self-pay | Admitting: Bariatrics

## 2021-02-06 NOTE — Progress Notes (Signed)
Chief Complaint:   OBESITY [name] [name] (MR# [id number]) is a 70 y.o. female who presents for evaluation and treatment of obesity and related comorbidities. Current BMI is Body mass index is 31.35 kg/m. [name] [name] has been struggling with her weight for many years and has been unsuccessful in either losing weight, maintaining weight loss, or reaching her healthy weight goal.  [name] [name] is currently in the action stage of change and ready to dedicate time achieving and maintaining a healthier weight. [name] [name] is interested in becoming our patient and working on intensive lifestyle modifications including (but not limited to) diet and exercise for weight loss.  [name] [name] likes to cook but notes time as an obstacle.  She craves starchy foods.  She sometimes skips breakfast.  [name] [name]'s habits were reviewed today and are as follows: Her family eats meals together, she thinks her family will eat healthier with her, her desired weight loss is 45 pounds, she started gaining weight after menopause, late 73s, her heaviest weight ever was 200 pounds, she pasta, potatoes, ice cream, bread, salty snacks, she snacks frequently in the evenings, she skips breakfast or lunch sometimes frequently, she frequently makes poor food choices, she has problems with excessive hunger, and she struggles with emotional eating.  Depression Screen [name] [name]'s Food and Mood (modified PHQ-9) score was 13.  Depression screen [name] [name] 2/9 02/02/2021  Decreased Interest 1  Down, Depressed, Hopeless 2  PHQ - 2 Score 3  Altered sleeping 0  Tired, decreased energy 3  Change in appetite 3  Feeling bad or failure about yourself  0  Trouble concentrating 3  Moving slowly or fidgety/restless 1  Suicidal thoughts 0  PHQ-9 Score 13  Difficult doing work/chores Not difficult at all  Some recent data might be hidden   Subjective:   1. Other fatigue [name] [name] denies daytime somnolence and reports waking up still tired. Patent has a history of symptoms  of morning fatigue. [name] [name] generally gets 7 or 8 hours of sleep per night, and states that she has generally restful sleep. Snoring is not present. Apneic episodes are not present. Epworth Sleepiness Score is 7.  Occurs with certain activities.   2. SOB (shortness of breath) on exertion [name] [name] notes increasing shortness of breath with exercising and seems to be worsening over time with weight gain. She notes getting out of breath sooner with activity than she used to. This has gotten worse recently. [name] [name] denies shortness of breath at rest or orthopnea.  Occurs with certain activities.  3. Diabetes mellitus type 2 in obese [name] [name], [name] [name]) [name] [name] is taking metformin.   Lab Results  Component Value Date   HGBA1C 6.2 (H) 02/02/2021   HGBA1C 6.2 (A) 10/21/2020   HGBA1C 5.9 (A) 07/22/2020   Lab Results  Component Value Date   MICROALBUR 1.3 02/10/2020   LDLCALC 31 02/02/2021   CREATININE 0.82 02/02/2021   Lab Results  Component Value Date   INSULIN 26.1 (H) 02/02/2021   INSULIN 62.0 (H) 12/20/2016   INSULIN 66.8 (H) 09/05/2016   4. Essential hypertension Review: taking medications as instructed, no medication side effects noted, no chest pain on exertion, no dyspnea on exertion, no swelling of ankles.  She is taking carvedilol, clonidine, hydralazine, and olmesartan-HCTZ.  BP Readings from Last 3 Encounters:  02/02/21 (!) 146/86  12/16/20 124/66  11/09/20 120/76   5. OSA on CPAP [name] [name] uses a CPAP.  She says she wakes up "still tired".   6. NAFLD (nonalcoholic fatty liver disease) Lab results  reviewed with patient. Counseling: Intensive lifestyle modifications are the first line treatment for this issue. We discussed several lifestyle modifications today and she will continue to work on diet, exercise and weight loss efforts. She denies abdominal pain.  Counseling:  NAFLD is an umbrella term that encompasses a disease spectrum that includes steatosis (fat) without inflammation, steatohepatitis  (NASH; fat + inflammation in a characteristic pattern), and cirrhosis. Bland steatosis is felt to be a benign condition, with extremely low to no risk of progression to cirrhosis, whereas NASH can progress to cirrhosis. The mainstay of treatment of NAFLD includes lifestyle modification to achieve weight loss, at least 7% of current body weight. Low carbohydrate diets can be beneficial in improving NAFLD liver histology. Additionally, exercise, even the absence of weight loss can have beneficial effects on the patient's metabolic profile and liver health. We recommend that their metabolic comorbidities be aggressively managed, as patients with NAFLD are at increased risk of coronary artery disease. Vitamin E has been demonstrated to improve hepatic steatosis and inflammation in non-diabetic patients with NASH.  7. Other hyperlipidemia [name] [name] has hyperlipidemia and has been trying to improve her cholesterol levels with intensive lifestyle modification including a low saturated fat diet, exercise and weight loss. She denies any chest pain, claudication or myalgias.  She is taking rosuvastatin.  Lab Results  Component Value Date   ALT 18 02/02/2021   AST 18 02/02/2021   ALKPHOS 64 02/02/2021   BILITOT 0.5 02/02/2021   Lab Results  Component Value Date   CHOL 101 02/02/2021   HDL 49 02/02/2021   LDLCALC 31 02/02/2021   LDLDIRECT 77.7 12/27/2009   TRIG 117 02/02/2021   CHOLHDL 2 02/10/2020   8. Microalbuminuria On olmesartan-HCTZ.  She has type 2 diabetes.  9. Vitamin D deficiency She is currently taking OTC vitamin D 3,000 IU each day. She denies nausea, vomiting or muscle weakness.  Lab Results  Component Value Date   VD25OH 62.4 02/02/2021   VD25OH 80.69 02/10/2020   VD25OH 49.4 12/20/2016   10. Depression screen [name] [name] was screened for depression as part of her new patient workup today.  PHQ-9 is 13.  Assessment/Plan:   1. Other fatigue [name] [name] does feel that her weight is  causing her energy to be lower than it should be. Fatigue may be related to obesity, depression or many other causes. Labs will be ordered, and in the meanwhile, Dorcus will focus on self care including making healthy food choices, increasing physical activity and focusing on stress reduction.  Gradually increase activities.  - EKG 12-Lead - T3 - T4, free - TSH  2. SOB (shortness of breath) on exertion Dezirae does feel that she gets out of breath more easily that she used to when she exercises. Mc's shortness of breath appears to be obesity related and exercise induced. She has agreed to work on weight loss and gradually increase exercise to treat her exercise induced shortness of breath. Will continue to monitor closely.  Gradually increase activities.  3. Diabetes mellitus type 2 in obese (HCC) Good blood sugar control is important to decrease the likelihood of diabetic complications such as nephropathy, neuropathy, limb loss, blindness, coronary artery disease, and death. Intensive lifestyle modification including diet, exercise and weight loss are the first line of treatment for diabetes. Continue medications, decrease carbohydrates.  Will check CMP, A1c, and insulin level today.  - Comprehensive metabolic panel - Hemoglobin A1c - Insulin, random  4. Essential hypertension [name] [name] is working on healthy weight loss  and exercise to improve blood pressure control. We will watch for signs of hypotension as she continues her lifestyle modifications.  Continue medications.   5. OSA on CPAP Intensive lifestyle modifications are the first line treatment for this issue. We discussed several lifestyle modifications today and she will continue to work on diet, exercise and weight loss efforts. We will continue to monitor. Orders and follow up as documented in patient record. Continue to use CPAP nightly.   6. NAFLD (nonalcoholic fatty liver disease) Will work on plan and activities.  -  Comprehensive metabolic panel  7. Other hyperlipidemia Cardiovascular risk and specific lipid/LDL goals reviewed.  We discussed several lifestyle modifications today and [name] [name] will continue to work on diet, exercise and weight loss efforts. Continue medications.  Counseling Intensive lifestyle modifications are the first line treatment for this issue. Dietary changes: Increase soluble fiber. Decrease simple carbohydrates. Exercise changes: Moderate to vigorous-intensity aerobic activity 150 minutes per week if tolerated. Lipid-lowering medications: see documented in medical record.  - Lipid Panel With LDL/HDL Ratio  8. Microalbuminuria Continue medications.  9. Vitamin D deficiency Low Vitamin D level contributes to fatigue and are associated with obesity, breast, and colon cancer. She agrees to continue to take prescription Vitamin D 3,000 IU daily and we will check her vitamin D level today.  - VITAMIN D 25 Hydroxy (Vit-D Deficiency, Fractures)  10. Depression screen [name] [name] had a positive depression screening. Depression is commonly associated with obesity and often results in emotional eating behaviors. We will monitor this closely and work on CBT to help improve the non-hunger eating patterns. Referral to Psychology may be required if no improvement is seen as she continues in our clinic.  11. Class 1 obesity with serious comorbidity and body mass index (BMI) of 31.0 to 31.9 in adult, unspecified obesity type  [name] [name] is currently in the action stage of change and her goal is to continue with weight loss efforts. I recommend [name] [name] begin the structured treatment plan as follows:  She has agreed to the Category 2 Plan.  She will work on meal planning, intentional eating, and increasing protein (she is not a "big meat eater").  Exercise goals: No exercise has been prescribed at this time.   Behavioral modification strategies: increasing lean protein intake, decreasing simple  carbohydrates, increasing vegetables, increasing water intake, decreasing eating out, no skipping meals, meal planning and cooking strategies, keeping healthy foods in the home, and planning for success.  She was informed of the importance of frequent follow-up visits to maximize her success with intensive lifestyle modifications for her multiple health conditions. She was informed we would discuss her lab results at her next visit unless there is a critical issue that needs to be addressed sooner. [name] [name] agreed to keep her next visit at the agreed upon time to discuss these results.  Objective:   Blood pressure (!) 146/86, temperature 97.8 F (36.6 C), height '5\' 3"'$  (1.6 m), weight 177 lb (80.3 kg), SpO2 96 %. Body mass index is 31.35 kg/m.  EKG: Normal sinus rhythm, rate 63 bpm.  Low voltage in precordial leads which could be related to  body habitus.  Otherwise normal.    Indirect Calorimeter completed today shows a VO2 of 246 and a REE of 1699.  Her calculated basal metabolic rate is [id number] thus her basal metabolic rate is better than expected.  General: Cooperative, alert, well developed, in no acute distress. HEENT: Conjunctivae and lids unremarkable. Cardiovascular: Regular rhythm.  Lungs: Normal work of  breathing. Neurologic: No focal deficits.   Lab Results  Component Value Date   CREATININE 0.82 02/02/2021   BUN 14 02/02/2021   NA 145 (H) 02/02/2021   K 3.9 02/02/2021   CL 104 02/02/2021   CO2 27 02/02/2021   Lab Results  Component Value Date   ALT 18 02/02/2021   AST 18 02/02/2021   ALKPHOS 64 02/02/2021   BILITOT 0.5 02/02/2021   Lab Results  Component Value Date   HGBA1C 6.2 (H) 02/02/2021   HGBA1C 6.2 (A) 10/21/2020   HGBA1C 5.9 (A) 07/22/2020   HGBA1C 6.6 (H) 02/10/2020   HGBA1C 6.4 (A) 01/28/2019   Lab Results  Component Value Date   INSULIN 26.1 (H) 02/02/2021   INSULIN 62.0 (H) 12/20/2016   INSULIN 66.8 (H) 09/05/2016   Lab Results  Component Value  Date   TSH 1.050 02/02/2021   Lab Results  Component Value Date   CHOL 101 02/02/2021   HDL 49 02/02/2021   LDLCALC 31 02/02/2021   LDLDIRECT 77.7 12/27/2009   TRIG 117 02/02/2021   CHOLHDL 2 02/10/2020   Lab Results  Component Value Date   WBC 4.4 02/10/2020   HGB 12.4 02/10/2020   HCT 37.0 02/10/2020   MCV 84.9 02/10/2020   PLT 157.0 02/10/2020   Lab Results  Component Value Date   FERRITIN 37.3 12/29/2014   Obesity Behavioral Intervention:   Approximately 15 minutes were spent on the discussion below.  ASK: We discussed the diagnosis of obesity with Sharnice today and Emberlee agreed to give [place] permission to discuss obesity behavioral modification therapy today.  ASSESS: Doniece has the diagnosis of obesity and her BMI today is 31.4. Shonette is in the action stage of change.   ADVISE: Ronnika was educated on the multiple health risks of obesity as well as the benefit of weight loss to improve her health. She was advised of the need for long term treatment and the importance of lifestyle modifications to improve her current health and to decrease her risk of future health problems.  AGREE: Multiple dietary modification options and treatment options were discussed and Rivky agreed to follow the recommendations documented in the above note.  ARRANGE: Maxeen was educated on the importance of frequent visits to treat obesity as outlined per CMS and USPSTF guidelines and agreed to schedule her next follow up appointment today.  Attestation Statements:   Reviewed by clinician on day of visit: allergies, medications, problem list, medical history, surgical history, family history, social history, and previous encounter notes.  I, Water quality scientist, CMA, am acting as Location manager for [employer], DO  I have reviewed the above documentation for accuracy and completeness, and I agree with the above. Jearld Lesch, DO

## 2021-02-07 DIAGNOSIS — E119 Type 2 diabetes mellitus without complications: Secondary | ICD-10-CM | POA: Diagnosis not present

## 2021-02-07 LAB — HM DIABETES EYE EXAM

## 2021-02-08 ENCOUNTER — Encounter: Payer: Self-pay | Admitting: Family Medicine

## 2021-02-15 ENCOUNTER — Other Ambulatory Visit: Payer: Self-pay | Admitting: Cardiology

## 2021-02-15 ENCOUNTER — Encounter: Payer: Self-pay | Admitting: Family Medicine

## 2021-02-16 ENCOUNTER — Other Ambulatory Visit: Payer: Self-pay | Admitting: Family Medicine

## 2021-02-16 ENCOUNTER — Other Ambulatory Visit: Payer: Self-pay

## 2021-02-16 ENCOUNTER — Other Ambulatory Visit: Payer: Self-pay | Admitting: *Deleted

## 2021-02-16 ENCOUNTER — Ambulatory Visit (INDEPENDENT_AMBULATORY_CARE_PROVIDER_SITE_OTHER): Payer: PPO | Admitting: Bariatrics

## 2021-02-16 ENCOUNTER — Encounter (INDEPENDENT_AMBULATORY_CARE_PROVIDER_SITE_OTHER): Payer: Self-pay | Admitting: Bariatrics

## 2021-02-16 VITALS — BP 124/80 | HR 63 | Temp 97.9°F | Ht 63.0 in | Wt 173.0 lb

## 2021-02-16 DIAGNOSIS — I10 Essential (primary) hypertension: Secondary | ICD-10-CM | POA: Diagnosis not present

## 2021-02-16 DIAGNOSIS — E1169 Type 2 diabetes mellitus with other specified complication: Secondary | ICD-10-CM

## 2021-02-16 DIAGNOSIS — Z1231 Encounter for screening mammogram for malignant neoplasm of breast: Secondary | ICD-10-CM

## 2021-02-16 DIAGNOSIS — E669 Obesity, unspecified: Secondary | ICD-10-CM

## 2021-02-16 DIAGNOSIS — Z6831 Body mass index (BMI) 31.0-31.9, adult: Secondary | ICD-10-CM

## 2021-02-16 MED ORDER — HYDRALAZINE HCL 100 MG PO TABS: 100.0000 mg | ORAL_TABLET | Freq: Two times a day (BID) | ORAL | 1 refills | Status: DC

## 2021-02-17 ENCOUNTER — Other Ambulatory Visit: Payer: Self-pay | Admitting: *Deleted

## 2021-02-17 DIAGNOSIS — Z87891 Personal history of nicotine dependence: Secondary | ICD-10-CM

## 2021-02-19 ENCOUNTER — Other Ambulatory Visit: Payer: Self-pay | Admitting: Family Medicine

## 2021-02-19 DIAGNOSIS — E785 Hyperlipidemia, unspecified: Secondary | ICD-10-CM

## 2021-02-19 DIAGNOSIS — E1169 Type 2 diabetes mellitus with other specified complication: Secondary | ICD-10-CM

## 2021-02-19 DIAGNOSIS — K760 Fatty (change of) liver, not elsewhere classified: Secondary | ICD-10-CM

## 2021-02-19 DIAGNOSIS — E118 Type 2 diabetes mellitus with unspecified complications: Secondary | ICD-10-CM

## 2021-02-19 DIAGNOSIS — E559 Vitamin D deficiency, unspecified: Secondary | ICD-10-CM

## 2021-02-20 ENCOUNTER — Encounter (INDEPENDENT_AMBULATORY_CARE_PROVIDER_SITE_OTHER): Payer: Self-pay | Admitting: Bariatrics

## 2021-02-20 NOTE — Progress Notes (Signed)
Chief Complaint:   OBESITY [name] [name] is here to discuss her progress with her obesity treatment plan along with follow-up of her obesity related diagnoses. [name] [name] is on the Category 2 Plan and states she is following her eating plan approximately 70% of the time. [name] [name] states she is doing 0 minutes 0 times per week.  Today's visit was #: 3 Starting weight: 177 lbs Starting date: 02/02/2021 Today's weight: 173 lbs Today's date: 02/16/2021 Total lbs lost to date: 4 lbs Total lbs lost since last in-office visit: 4 lbs  Interim History: [name] [name] is down 4 lbs since her first visit. She states that the plan was not that hard.  Subjective:   1. Diabetes mellitus type 2 in obese [name] [name] is currently taking Metformin.  2. Essential hypertension [name] [name] hypertension is controlled. She is currently taking her medications as directed.  Assessment/Plan:   1. Diabetes mellitus type 2 in obese [name] will continue medications. Good blood sugar control is important to decrease the likelihood of diabetic complications such as nephropathy, neuropathy, limb loss, blindness, coronary artery disease, and death. Intensive lifestyle modification including diet, exercise and weight loss are the first line of treatment for diabetes.    2. Essential hypertension [name] [name] will continue medications. She is working on healthy weight loss and exercise to improve blood pressure control. We will watch for signs of hypotension as she continues her lifestyle modifications.   3. Obesity, current BMI 30.8 [name] [name] is currently in the action stage of change. As such, her goal is to continue with weight loss efforts. She has agreed to the Category 2 Plan.   [name] [name] will continue meal planning. She will continue intentional eating. We will review labs from 02/02/2021. A handout on Tips for meal plan was given today. She will increase protein.  Exercise goals: No exercise has been prescribed at this time.  Behavioral  modification strategies: increasing lean protein intake, decreasing simple carbohydrates, increasing vegetables, increasing water intake, decreasing eating out, no skipping meals, meal planning and cooking strategies, keeping healthy foods in the home, and planning for success.  [name] [name] has agreed to follow-up with [name] clinic in 2 weeks. She was informed of the importance of frequent follow-up visits to maximize her success with intensive lifestyle modifications for her multiple health conditions.   Objective:   Blood pressure 124/80, pulse 63, temperature 97.9 F (36.6 C), height '5\' 3"'$  (1.6 m), weight 173 lb (78.5 kg), SpO2 97 %. Body mass index is 30.65 kg/m.  General: Cooperative, alert, well developed, in no acute distress. HEENT: Conjunctivae and lids unremarkable. Cardiovascular: Regular rhythm.  Lungs: Normal work of breathing. Neurologic: No focal deficits.   Lab Results  Component Value Date   CREATININE 0.82 02/02/2021   BUN 14 02/02/2021   NA 145 (H) 02/02/2021   K 3.9 02/02/2021   CL 104 02/02/2021   CO2 27 02/02/2021   Lab Results  Component Value Date   ALT 18 02/02/2021   AST 18 02/02/2021   ALKPHOS 64 02/02/2021   BILITOT 0.5 02/02/2021   Lab Results  Component Value Date   HGBA1C 6.2 (H) 02/02/2021   HGBA1C 6.2 (A) 10/21/2020   HGBA1C 5.9 (A) 07/22/2020   HGBA1C 6.6 (H) 02/10/2020   HGBA1C 6.4 (A) 01/28/2019   Lab Results  Component Value Date   INSULIN 26.1 (H) 02/02/2021   INSULIN 62.0 (H) 12/20/2016   INSULIN 66.8 (H) 09/05/2016   Lab Results  Component Value Date   TSH 1.050  02/02/2021   Lab Results  Component Value Date   CHOL 101 02/02/2021   HDL 49 02/02/2021   LDLCALC 31 02/02/2021   LDLDIRECT 77.7 12/27/2009   TRIG 117 02/02/2021   CHOLHDL 2 02/10/2020   Lab Results  Component Value Date   VD25OH 62.4 02/02/2021   VD25OH 80.69 02/10/2020   VD25OH 49.4 12/20/2016   Lab Results  Component Value Date   WBC 4.4 02/10/2020   HGB  12.4 02/10/2020   HCT 37.0 02/10/2020   MCV 84.9 02/10/2020   PLT 157.0 02/10/2020   Lab Results  Component Value Date   FERRITIN 37.3 12/29/2014    Obesity Behavioral Intervention:   Approximately 15 minutes were spent on the discussion below.  ASK: We discussed the diagnosis of obesity with [name] [name] today and [name] [name] agreed to give [place] permission to discuss obesity behavioral modification therapy today.  ASSESS: [name] [name] has the diagnosis of obesity and her BMI today is 30.8. [name] is in the action stage of change.   ADVISE: [name] was educated on the multiple health risks of obesity as well as the benefit of weight loss to improve her health. She was advised of the need for long term treatment and the importance of lifestyle modifications to improve her current health and to decrease her risk of future health problems.  AGREE: Multiple dietary modification options and treatment options were discussed and [name] agreed to follow the recommendations documented in the above note.  ARRANGE: [name] [name] was educated on the importance of frequent visits to treat obesity as outlined per CMS and USPSTF guidelines and agreed to schedule her next follow up appointment today.  Attestation Statements:   Reviewed by clinician on day of visit: allergies, medications, problem list, medical history, surgical history, family history, social history, and previous encounter notes.  I, Lizbeth Bark, RMA, am acting as Location manager for [employer], DO.   I have reviewed the above documentation for accuracy and completeness, and I agree with the above. Jearld Lesch, DO

## 2021-02-21 ENCOUNTER — Encounter: Payer: Self-pay | Admitting: Family Medicine

## 2021-02-21 ENCOUNTER — Ambulatory Visit: Payer: PPO

## 2021-02-22 ENCOUNTER — Other Ambulatory Visit: Payer: PPO

## 2021-02-22 ENCOUNTER — Telehealth: Payer: Self-pay | Admitting: *Deleted

## 2021-02-22 NOTE — Telephone Encounter (Signed)
Please place future orders for lab appt.at [facility] office on 9/15

## 2021-02-22 NOTE — Telephone Encounter (Signed)
See recent mychart messages.  Pt does not need labs this year. Thank you.

## 2021-02-23 ENCOUNTER — Other Ambulatory Visit: Payer: Self-pay

## 2021-02-28 ENCOUNTER — Ambulatory Visit (INDEPENDENT_AMBULATORY_CARE_PROVIDER_SITE_OTHER): Payer: PPO | Admitting: Family Medicine

## 2021-02-28 ENCOUNTER — Other Ambulatory Visit: Payer: Self-pay

## 2021-02-28 ENCOUNTER — Encounter: Payer: Self-pay | Admitting: Family Medicine

## 2021-02-28 VITALS — BP 130/70 | HR 60 | Temp 98.0°F | Ht 62.5 in | Wt 175.6 lb

## 2021-02-28 DIAGNOSIS — K219 Gastro-esophageal reflux disease without esophagitis: Secondary | ICD-10-CM

## 2021-02-28 DIAGNOSIS — M8580 Other specified disorders of bone density and structure, unspecified site: Secondary | ICD-10-CM

## 2021-02-28 DIAGNOSIS — E66811 Obesity, class 1: Secondary | ICD-10-CM

## 2021-02-28 DIAGNOSIS — Z1211 Encounter for screening for malignant neoplasm of colon: Secondary | ICD-10-CM

## 2021-02-28 DIAGNOSIS — M7989 Other specified soft tissue disorders: Secondary | ICD-10-CM | POA: Diagnosis not present

## 2021-02-28 DIAGNOSIS — Z0000 Encounter for general adult medical examination without abnormal findings: Secondary | ICD-10-CM

## 2021-02-28 DIAGNOSIS — Z23 Encounter for immunization: Secondary | ICD-10-CM | POA: Diagnosis not present

## 2021-02-28 DIAGNOSIS — I10 Essential (primary) hypertension: Secondary | ICD-10-CM

## 2021-02-28 DIAGNOSIS — K760 Fatty (change of) liver, not elsewhere classified: Secondary | ICD-10-CM

## 2021-02-28 DIAGNOSIS — E669 Obesity, unspecified: Secondary | ICD-10-CM

## 2021-02-28 DIAGNOSIS — E559 Vitamin D deficiency, unspecified: Secondary | ICD-10-CM

## 2021-02-28 DIAGNOSIS — Z87891 Personal history of nicotine dependence: Secondary | ICD-10-CM

## 2021-02-28 DIAGNOSIS — Z7189 Other specified counseling: Secondary | ICD-10-CM | POA: Insufficient documentation

## 2021-02-28 DIAGNOSIS — E1169 Type 2 diabetes mellitus with other specified complication: Secondary | ICD-10-CM

## 2021-02-28 DIAGNOSIS — F331 Major depressive disorder, recurrent, moderate: Secondary | ICD-10-CM

## 2021-02-28 DIAGNOSIS — E118 Type 2 diabetes mellitus with unspecified complications: Secondary | ICD-10-CM

## 2021-02-28 MED ORDER — METFORMIN HCL ER 750 MG PO TB24: ORAL_TABLET | ORAL | 3 refills | Status: DC

## 2021-02-28 MED ORDER — PANTOPRAZOLE SODIUM 40 MG PO TBEC: DELAYED_RELEASE_TABLET | ORAL | 3 refills | Status: DC

## 2021-02-28 NOTE — Assessment & Plan Note (Signed)
Chronic, overall stable. Elevated reading in office today however home readings largely well controlled - will continue antihypertensives through cardiology (hydralazine, clonidine, carvedilol, benicar hct).

## 2021-02-28 NOTE — Assessment & Plan Note (Signed)
History of this per chart. Consider GLP1RA vs actos.

## 2021-02-28 NOTE — Progress Notes (Signed)
Patient ID: [name] [name], female    DOB: 02/19/1951, 70 y.o.   MRN: [id number]  This visit was conducted in person.  BP 130/70 Comment: home reading  Pulse 60   Temp 98 F (36.7 C) (Temporal)   Ht 5' 2.5" (1.588 m)   Wt 175 lb 9 oz (79.6 kg)   SpO2 95%   BMI 31.60 kg/m   Home BP readings well controlled.  CC: CPE/AMW Subjective:   HPI: [name] [name] is a 70 y.o. female presenting on 02/28/2021 for Medicare Wellness   Did not see health advisor this year.  Has [place] Medicare.   [facility]. Recent labs through them were reviewed.   Hearing Screening   '500Hz'  '1000Hz'  '2000Hz'  '4000Hz'   Right ear 40 0 40 0  Left ear 40 0 40 40  Vision Screening - Comments:: Last eye exam, 02/2021.  [place] Office Visit from 02/28/2021 in [facility]  PHQ-2 Total Score 0       Fall Risk  03/17/2020 02/10/2020 01/05/2020 11/13/2018 07/14/2018  Falls in the past year? 0 0 0 0 1  Comment - - Emmi Telephone Survey: data to providers prior to load - -  Number falls in past yr: - 0 - - 1  Injury with Fall? - 0 - - 0  Risk for fall due to : - Medication side effect - - History of fall(s)  Follow up - Falls evaluation completed;Falls prevention discussed - - -    Brings home BP log: 120-140/70s, HR 60s  Notes L calf swelling over last several months, overall asxs. L leg US WNL 01/2019.   Preventative: Colon cancer screening - [facility] rpt 10 yrs Sharlett Iles) 05/2011  Breast cancer screening - [facility]o [facility]2020 Well woman exam - through OBGYN Dr Lisbeth Renshaw at [facility], normal paps in the past DEXA scan - WNL 07/2018 Lung cancer screening - started 12/2020  Flu shot - yearly [facility] 08/2019, 09/2019, planning to get booster  Tdap 2012 Prevnar-13 2016, Pneumovax-23 2012, 2018 Shingrix - discussed, declines. Has not had chicken pox  Advanced directive discussion - does not have at home - packet provided. Daughter  Margarette Canada would be HCPOA.  Seat belt use discussed Sunscreen use discussed. No changing moles on skin. Dentist - q6 mo Eye exam - yearly  Ex smoker - quit 2015. 40+ PY hx.  Alcohol  - rare Bowel - constipation managed with miralax daily.  Bladder -  no incontinence      Relevant past medical, surgical, family and social history reviewed and updated as indicated. Interim medical history since our last visit reviewed. Allergies and medications reviewed and updated. Outpatient Medications Prior to Visit  Medication Sig Dispense Refill   albuterol (VENTOLIN HFA) 108 (90 Base) MCG/ACT inhaler Inhale 1-2 puffs into the lungs every 6 (six) hours as needed for wheezing or shortness of breath. 1 each 0   aspirin 81 MG tablet Take 81 mg by mouth daily.     blood glucose meter kit and supplies KIT Dispense based on patient and insurance preference. Use up to four times daily as directed. (FOR ICD-9 250.00, 250.01). 1 each 0   buPROPion (WELLBUTRIN XL) 150 MG 24 hr tablet TAKE 3 TABLETS(450 MG) BY MOUTH EVERY MORNING 90 tablet 1   Calcium Carbonate-Vitamin D 600-400 MG-UNIT tablet Take 2 tablets 2 (two) times daily by mouth.  carvedilol (COREG) 12.5 MG tablet TAKE 1 AND 1/2 TABLETS(18.75 MG) BY MOUTH TWICE DAILY 270 tablet 3   Cholecalciferol (VITAMIN D3) 3000 units TABS Take 1 tablet by mouth daily. 30 tablet    cloNIDine (CATAPRES) 0.1 MG tablet Take 1 tablet (0.1 mg total) by mouth 2 (two) times daily. 180 tablet 1   fluticasone (FLONASE) 50 MCG/ACT nasal spray Place 2 sprays into both nostrils daily. 16 g 3   glucose blood (ONETOUCH ULTRA) test strip Use as instructed to check blood sugar once daily. 100 each 3   hydrALAZINE (APRESOLINE) 100 MG tablet Take 1 tablet (100 mg total) by mouth in the morning and at bedtime. 180 tablet 1   Lancets (ONETOUCH ULTRASOFT) lancets Use to check blood sugar 3 times a day. 100 each 1   Multiple Vitamins-Minerals (MULTIVITAMIN ADULTS 50+ PO) Take 1  tablet every morning by mouth.      olmesartan-hydrochlorothiazide (BENICAR HCT) 40-12.5 MG tablet TAKE 1 TABLET BY MOUTH DAILY 90 tablet 3   Peppermint Oil (IBGARD) 90 MG CPCR Take 1 capsule by mouth 3 (three) times daily as needed.     polyethylene glycol (MIRALAX / GLYCOLAX) 17 g packet Take 17 g by mouth daily.     Probiotic Product (PROBIOTIC DAILY PO) Take 1 capsule every morning by mouth.      rosuvastatin (CRESTOR) 20 MG tablet TAKE 1 TABLET(20 MG) BY MOUTH DAILY 90 tablet 3   metFORMIN (GLUCOPHAGE-XR) 750 MG 24 hr tablet TAKE 1 TABLET(750 MG) BY MOUTH TWICE DAILY WITH A MEAL 180 tablet 1   pantoprazole (PROTONIX) 40 MG tablet TAKE 1 TABLET(40 MG) BY MOUTH EVERY MORNING 90 tablet 1   Facility-Administered Medications Prior to Visit  Medication Dose Route Frequency Provider Last Rate Last Admin   alum & mag hydroxide-simeth (MAALOX/MYLANTA) 200-200-20 MG/5ML suspension 10 mL  10 mL Oral Once Ria Bush, MD         Per HPI unless specifically indicated in ROS section below Review of Systems  Constitutional:  Positive for chills. Negative for activity change, appetite change, fatigue, fever and unexpected weight change.  HENT:  Negative for hearing loss.   Eyes:  Negative for visual disturbance.  Respiratory:  Negative for cough, chest tightness, shortness of breath and wheezing.   Cardiovascular:  Positive for palpitations and leg swelling. Negative for chest pain.  Gastrointestinal:  Positive for constipation. Negative for abdominal distention, abdominal pain, blood in stool, diarrhea, nausea and vomiting.  Genitourinary:  Negative for difficulty urinating and hematuria.  Musculoskeletal:  Negative for arthralgias, myalgias and neck pain.  Skin:  Negative for rash.  Neurological:  Positive for light-headedness. Negative for dizziness, seizures, syncope and headaches.  Hematological:  Negative for adenopathy. Does not bruise/bleed easily.  Psychiatric/Behavioral:  Negative for  dysphoric mood. The patient is not nervous/anxious.    Objective:  BP 130/70 Comment: home reading  Pulse 60   Temp 98 F (36.7 C) (Temporal)   Ht 5' 2.5" (1.588 m)   Wt 175 lb 9 oz (79.6 kg)   SpO2 95%   BMI 31.60 kg/m   Wt Readings from Last 3 Encounters:  02/28/21 175 lb 9 oz (79.6 kg)  02/16/21 173 lb (78.5 kg)  02/02/21 177 lb (80.3 kg)      Physical Exam Vitals and nursing note reviewed.  Constitutional:      Appearance: Normal appearance. She is not ill-appearing.  HENT:     Head: Normocephalic and atraumatic.     Right Ear:  Tympanic membrane, ear canal and external ear normal. There is no impacted cerumen.     Left Ear: Tympanic membrane, ear canal and external ear normal. There is no impacted cerumen.  Eyes:     General:        Right eye: No discharge.        Left eye: No discharge.     Extraocular Movements: Extraocular movements intact.     Conjunctiva/sclera: Conjunctivae normal.     Pupils: Pupils are equal, round, and reactive to light.  Neck:     Thyroid: No thyroid mass or thyromegaly.     Vascular: No carotid bruit.  Cardiovascular:     Rate and Rhythm: Normal rate and regular rhythm.     Pulses: Normal pulses.     Heart sounds: Normal heart sounds. No murmur heard. Pulmonary:     Effort: Pulmonary effort is normal. No respiratory distress.     Breath sounds: Normal breath sounds. No wheezing, rhonchi or rales.  Abdominal:     General: Bowel sounds are normal. There is no distension.     Palpations: Abdomen is soft. There is no mass.     Tenderness: There is no abdominal tenderness. There is no guarding or rebound.     Hernia: No hernia is present.  Musculoskeletal:     Cervical back: Normal range of motion and neck supple. No rigidity.     Right lower leg: No edema.     Left lower leg: No edema.     Comments:  R calf circ 35cm L calf circ 36.5cm More prominent varicose veins left lower leg 2+ DP bilaterally  Lymphadenopathy:     Cervical: No  cervical adenopathy.  Skin:    General: Skin is warm and dry.     Findings: No rash.  Neurological:     General: No focal deficit present.     Mental Status: She is alert. Mental status is at baseline.     Comments:  Recall 3/3 Calculation 5/5 DLROW  Psychiatric:        Mood and Affect: Mood normal.        Behavior: Behavior normal.      Results for orders placed or performed in visit on 02/08/21  HM DIABETES EYE EXAM  Result Value Ref Range   HM Diabetic Eye Exam No Retinopathy No Retinopathy    Assessment & Plan:  This visit occurred during the SARS-CoV-2 public health emergency.  Safety protocols were in place, including screening questions prior to the visit, additional usage of staff PPE, and extensive cleaning of exam room while observing appropriate contact time as indicated for disinfecting solutions.   Problem List Items Addressed This Visit     Health maintenance examination (Chronic)    Preventative protocols reviewed and updated unless pt declined. Discussed healthy diet and lifestyle.       Medicare annual wellness visit, subsequent - Primary (Chronic)    I have personally reviewed the Medicare Annual Wellness questionnaire and have noted 1. The patient's medical and social history 2. Their use of alcohol, tobacco or illicit drugs 3. Their current medications and supplements 4. The patient's functional ability including ADL's, fall risks, home safety risks and hearing or visual impairment. Cognitive function has been assessed and addressed as indicated.  5. Diet and physical activity 6. Evidence for depression or mood disorders The patients weight, height, BMI have been recorded in the chart. I have made referrals, counseling and provided education to the patient based  on review of the above and I have provided the pt with a written personalized care plan for preventive services. Provider list updated.. See scanned questionairre as needed for further  documentation. Reviewed preventative protocols and updated unless pt declined.       Advanced directives, counseling/discussion (Chronic)    Advanced directive discussion - does not have at home - packet provided. Daughter Margarette Canada would be HCPOA.       Controlled diabetes mellitus type 2 with complications (HCC)    Chronic, well controlled on metformin XR - continue.  RTC 6 mo DM f/u visit.       Relevant Medications   metFORMIN (GLUCOPHAGE-XR) 750 MG 24 hr tablet   Hyperlipidemia associated with type 2 diabetes mellitus (HCC)    Chronic, stable on crestor 38m daily - continue. The ASCVD Risk score (Arnett DK, et al., 2019) failed to calculate for the following reasons:   The valid total cholesterol range is 130 to 320 mg/dL       Relevant Medications   metFORMIN (GLUCOPHAGE-XR) 750 MG 24 hr tablet   MDD (major depressive disorder), recurrent episode, moderate ([place]    Followed by psychiatry.       Essential hypertension, benign    Chronic, overall stable. Elevated reading in office today however home readings largely well controlled - will continue antihypertensives through cardiology (hydralazine, clonidine, carvedilol, benicar hct).       GERD    Continues daily pantoprazole.       Relevant Medications   pantoprazole (PROTONIX) 40 MG tablet   Obesity, Class I, BMI 30-34.9    Continue to encourage healthy diet and lifestyle choices to affect sustainable weight loss.       NAFLD (nonalcoholic fatty liver disease)    History of this per chart. Consider GLP1RA vs actos.       Vitamin D deficiency    Maintaining levels on 3000 IU daily replacement.      Ex-smoker    Quit ~2015 - congratulated on this.  Has established with pulm program for lung cancer screening [place].       Left leg swelling    Noted over last few months, without inciting etiology found.  fmhx blood clots.  Check D dimer, if positive discussed need for venous [place]       Relevant  Orders   D-dimer, quantitative   RESOLVED: Osteopenia    Unclear diagnosis as latest DEXA 2020 returned normal - will resolve this problem.       Other Visit Diagnoses     Need for influenza vaccination       Relevant Orders   Flu Vaccine QUAD High Dose(Fluad) (Completed)   Special screening for malignant neoplasms, colon       Relevant Orders   Ambulatory referral to Gastroenterology        Meds ordered this encounter  Medications   metFORMIN (GLUCOPHAGE-XR) 750 MG 24 hr tablet    Sig: TAKE 1 TABLET(750 MG) BY MOUTH TWICE DAILY WITH A MEAL    Dispense:  180 tablet    Refill:  3   pantoprazole (PROTONIX) 40 MG tablet    Sig: TAKE 1 TABLET(40 MG) BY MOUTH EVERY MORNING    Dispense:  90 tablet    Refill:  3    Orders Placed This Encounter  Procedures   Flu Vaccine QUAD High Dose(Fluad)   D-dimer, quantitative   Ambulatory referral to Gastroenterology    Referral Priority:   Routine  Referral Type:   Consultation    Referral Reason:   Specialty Services Required    Number of Visits Requested:   1    Patient instructions: Flu shot today.  Labs today (D dimer).  We will refer you for colonoscopy.  Advanced directive packet provided today.  You are doing well today Return as needed or in 6 months for diabetes follow up visit  Follow up plan: Return in about 6 months (around 08/28/2021) for follow up visit.  Ria Bush, MD

## 2021-02-28 NOTE — Assessment & Plan Note (Signed)
Noted over last few months, without inciting etiology found.  fmhx blood clots.  Check D dimer, if positive discussed need for venous US.

## 2021-02-28 NOTE — Assessment & Plan Note (Signed)
Continues daily pantoprazole.  

## 2021-02-28 NOTE — Assessment & Plan Note (Signed)
Followed by psychiatry 

## 2021-02-28 NOTE — Patient Instructions (Addendum)
Flu shot today.  Labs today (D dimer).  We will refer you for colonoscopy.  Advanced directive packet provided today.  You are doing well today Return as needed or in 6 months for diabetes follow up visit  Health Maintenance After Age 71 After age 33, you are at a higher risk for certain long-term diseases and infections as well as injuries from falls. Falls are a major cause of broken bones and head injuries in people who are older than age 87. Getting regular preventive care can help to keep you healthy and well. Preventive care includes getting regular testing and making lifestyle changes as recommended by your health care provider. Talk with your health care provider about: Which screenings and tests you should have. A screening is a test that checks for a disease when you have no symptoms. A diet and exercise plan that is right for you. What should I know about screenings and tests to prevent falls? Screening and testing are the best ways to find a health problem early. Early diagnosis and treatment give you the best chance of managing medical conditions that are common after age 90. Certain conditions and lifestyle choices may make you more likely to have a fall. Your health care provider may recommend: Regular vision checks. Poor vision and conditions such as cataracts can make you more likely to have a fall. If you wear glasses, make sure to get your prescription updated if your vision changes. Medicine review. Work with your health care provider to regularly review all of the medicines you are taking, including over-the-counter medicines. Ask your health care provider about any side effects that may make you more likely to have a fall. Tell your health care provider if any medicines that you take make you feel dizzy or sleepy. Osteoporosis screening. Osteoporosis is a condition that causes the bones to get weaker. This can make the bones weak and cause them to break more easily. Blood  pressure screening. Blood pressure changes and medicines to control blood pressure can make you feel dizzy. Strength and balance checks. Your health care provider may recommend certain tests to check your strength and balance while standing, walking, or changing positions. Foot health exam. Foot pain and numbness, as well as not wearing proper footwear, can make you more likely to have a fall. Depression screening. You may be more likely to have a fall if you have a fear of falling, feel emotionally low, or feel unable to do activities that you used to do. Alcohol use screening. Using too much alcohol can affect your balance and may make you more likely to have a fall. What actions can I take to lower my risk of falls? General instructions Talk with your health care provider about your risks for falling. Tell your health care provider if: You fall. Be sure to tell your health care provider about all falls, even ones that seem minor. You feel dizzy, sleepy, or off-balance. Take over-the-counter and prescription medicines only as told by your health care provider. These include any supplements. Eat a healthy diet and maintain a healthy weight. A healthy diet includes low-fat dairy products, low-fat (lean) meats, and fiber from whole grains, beans, and lots of fruits and vegetables. Home safety Remove any tripping hazards, such as rugs, cords, and clutter. Install safety equipment such as grab bars in bathrooms and safety rails on stairs. Keep rooms and walkways well-lit. Activity  Follow a regular exercise program to stay fit. This will help you maintain your  balance. Ask your health care provider what types of exercise are appropriate for you. If you need a cane or walker, use it as recommended by your health care provider. Wear supportive shoes that have nonskid soles. Lifestyle Do not drink alcohol if your health care provider tells you not to drink. If you drink alcohol, limit how much you  have: 0-1 drink a day for women. 0-2 drinks a day for men. Be aware of how much alcohol is in your drink. In the [place] one drink equals one typical bottle of beer (12 oz), one-half glass of wine (5 oz), or one shot of hard liquor (1 oz). Do not use any products that contain nicotine or tobacco, such as cigarettes and e-cigarettes. If you need help quitting, ask your health care provider. Summary Having a healthy lifestyle and getting preventive care can help to protect your health and wellness after age 36. Screening and testing are the best way to find a health problem early and help you avoid having a fall. Early diagnosis and treatment give you the best chance for managing medical conditions that are more common for people who are older than age 30. Falls are a major cause of broken bones and head injuries in people who are older than age 37. Take precautions to prevent a fall at home. Work with your health care provider to learn what changes you can make to improve your health and wellness and to prevent falls. This information is not intended to replace advice given to you by your health care provider. Make sure you discuss any questions you have with your health care provider. Document Revised: 08/05/2020 Document Reviewed: 05/13/2020 Elsevier Patient Education  2022 [employer].

## 2021-02-28 NOTE — Assessment & Plan Note (Signed)
Chronic, well controlled on metformin XR - continue.  RTC 6 mo DM f/u visit.

## 2021-02-28 NOTE — Assessment & Plan Note (Signed)
Preventative protocols reviewed and updated unless pt declined. Discussed healthy diet and lifestyle.  

## 2021-02-28 NOTE — Assessment & Plan Note (Signed)
Advanced directive discussion - does not have at home - packet provided. Daughter Margarette [place] would be HCPOA.

## 2021-02-28 NOTE — Assessment & Plan Note (Signed)
Quit ~2015 - congratulated on this.  Has established with pulm program for lung cancer screening CT.

## 2021-02-28 NOTE — Assessment & Plan Note (Signed)
Unclear diagnosis as latest DEXA 2020 returned normal - will resolve this problem.

## 2021-02-28 NOTE — Assessment & Plan Note (Signed)

## 2021-02-28 NOTE — Assessment & Plan Note (Signed)
Continue to encourage healthy diet and lifestyle choices to affect sustainable weight loss.  °

## 2021-02-28 NOTE — Assessment & Plan Note (Signed)
Chronic, stable on crestor 20mg  daily - continue. The ASCVD Risk score (Arnett DK, et al., 2019) failed to calculate for the following reasons:   The valid total cholesterol range is 130 to 320 mg/dL

## 2021-02-28 NOTE — Assessment & Plan Note (Addendum)
Maintaining levels on 3000 IU daily replacement.

## 2021-03-01 LAB — D-DIMER, QUANTITATIVE: D-Dimer, Quant: 0.44 mcg/mL FEU (ref <0.50)

## 2021-03-02 ENCOUNTER — Other Ambulatory Visit: Payer: Self-pay | Admitting: Cardiology

## 2021-03-03 ENCOUNTER — Other Ambulatory Visit: Payer: Self-pay

## 2021-03-03 ENCOUNTER — Ambulatory Visit (INDEPENDENT_AMBULATORY_CARE_PROVIDER_SITE_OTHER): Payer: PPO | Admitting: Physician Assistant

## 2021-03-03 ENCOUNTER — Encounter (INDEPENDENT_AMBULATORY_CARE_PROVIDER_SITE_OTHER): Payer: Self-pay | Admitting: Physician Assistant

## 2021-03-03 VITALS — BP 119/63 | HR 71 | Temp 98.1°F | Ht 63.0 in | Wt 174.0 lb

## 2021-03-03 DIAGNOSIS — E669 Obesity, unspecified: Secondary | ICD-10-CM | POA: Diagnosis not present

## 2021-03-03 DIAGNOSIS — Z6831 Body mass index (BMI) 31.0-31.9, adult: Secondary | ICD-10-CM | POA: Diagnosis not present

## 2021-03-03 DIAGNOSIS — E118 Type 2 diabetes mellitus with unspecified complications: Secondary | ICD-10-CM | POA: Diagnosis not present

## 2021-03-06 ENCOUNTER — Encounter: Payer: Self-pay | Admitting: Family Medicine

## 2021-03-06 DIAGNOSIS — E118 Type 2 diabetes mellitus with unspecified complications: Secondary | ICD-10-CM

## 2021-03-06 MED ORDER — ONETOUCH DELICA LANCETS 33G MISC
3 refills | Status: DC
Start: 2021-03-06 — End: 2021-03-08

## 2021-03-06 NOTE — Telephone Encounter (Signed)
E-scribed lancets.

## 2021-03-06 NOTE — Progress Notes (Signed)
Chief Complaint:   OBESITY [name] [name] is here to discuss her progress with her obesity treatment plan along with follow-up of her obesity related diagnoses. [name] [name] is on the Category 2 Plan and states she is following her eating plan approximately 75% of the time. [name] [name] states she is doing 0 minutes 0 times per week.  Today's visit was #: 4 Starting weight: 177 lbs Starting date: 02/02/2021 Today's weight: 174 lbs Today's date: 03/03/2021 Total lbs lost to date: 3 lbs Total lbs lost since last in-office visit: 0  Interim History: [name] [name] reports that she is making better choices. She is doing Special K protein with [employer] milk for breakfast and a sandwich for lunch. She states that she is not eating enough vegetables.   Subjective:   1. Controlled type 2 diabetes mellitus with complication, without long-term current use of insulin (HCC) [name] [name] is currently on Metformin. Her last A1C level was 6.2. Her fasting blood sugar was 150-160. Her diabetes is managed by PCP.  Assessment/Plan:   1. Controlled type 2 diabetes mellitus with complication, without long-term current use of insulin (HCC) [name] [name] will continue medications and weight loss. Good blood sugar control is important to decrease the likelihood of diabetic complications such as nephropathy, neuropathy, limb loss, blindness, coronary artery disease, and death. Intensive lifestyle modification including diet, exercise and weight loss are the first line of treatment for diabetes.   2. Obesity, current BMI 30.83 [name] [name] is currently in the action stage of change. As such, her goal is to continue with weight loss efforts. She has agreed to the Category 2 Plan.   Exercise goals: No exercise has been prescribed at this time.  Behavioral modification strategies: meal planning and cooking strategies and keeping healthy foods in the home.  [name] [name] has agreed to follow-up with our clinic in 2 weeks. She was informed of the importance of frequent  follow-up visits to maximize her success with intensive lifestyle modifications for her multiple health conditions.   Objective:   Blood pressure 119/63, pulse 71, temperature 98.1 F (36.7 C), height 5\' 3"  (1.6 m), weight 174 lb (78.9 kg), SpO2 98 %. Body mass index is 30.82 kg/m.  General: Cooperative, alert, well developed, in no acute distress. HEENT: Conjunctivae and lids unremarkable. Cardiovascular: Regular rhythm.  Lungs: Normal work of breathing. Neurologic: No focal deficits.   Lab Results  Component Value Date   CREATININE 0.82 02/02/2021   BUN 14 02/02/2021   NA 145 (H) 02/02/2021   K 3.9 02/02/2021   CL 104 02/02/2021   CO2 27 02/02/2021   Lab Results  Component Value Date   ALT 18 02/02/2021   AST 18 02/02/2021   ALKPHOS 64 02/02/2021   BILITOT 0.5 02/02/2021   Lab Results  Component Value Date   HGBA1C 6.2 (H) 02/02/2021   HGBA1C 6.2 (A) 10/21/2020   HGBA1C 5.9 (A) 07/22/2020   HGBA1C 6.6 (H) 02/10/2020   HGBA1C 6.4 (A) 01/28/2019   Lab Results  Component Value Date   INSULIN 26.1 (H) 02/02/2021   INSULIN 62.0 (H) 12/20/2016   INSULIN 66.8 (H) 09/05/2016   Lab Results  Component Value Date   TSH 1.050 02/02/2021   Lab Results  Component Value Date   CHOL 101 02/02/2021   HDL 49 02/02/2021   LDLCALC 31 02/02/2021   LDLDIRECT 77.7 12/27/2009   TRIG 117 02/02/2021   CHOLHDL 2 02/10/2020   Lab Results  Component Value Date   VD25OH 62.4 02/02/2021   VD25OH  80.69 02/10/2020   VD25OH 49.4 12/20/2016   Lab Results  Component Value Date   WBC 4.4 02/10/2020   HGB 12.4 02/10/2020   HCT 37.0 02/10/2020   MCV 84.9 02/10/2020   PLT 157.0 02/10/2020   Lab Results  Component Value Date   FERRITIN 37.3 12/29/2014    Obesity Behavioral Intervention:   Approximately 15 minutes were spent on the discussion below.  ASK: We discussed the diagnosis of obesity with [name] [name] today and [name] [name] agreed to give [place] permission to discuss obesity  behavioral modification therapy today.  ASSESS: [name] [name] has the diagnosis of obesity and her BMI today is 30.9. [name] [name] is in the action stage of change.   ADVISE: [name] [name] was educated on the multiple health risks of obesity as well as the benefit of weight loss to improve her health. She was advised of the need for long term treatment and the importance of lifestyle modifications to improve her current health and to decrease her risk of future health problems.  AGREE: Multiple dietary modification options and treatment options were discussed and [name] [name] agreed to follow the recommendations documented in the above note.  ARRANGE: [name] [name] was educated on the importance of frequent visits to treat obesity as outlined per CMS and USPSTF guidelines and agreed to schedule her next follow up appointment today.  Attestation Statements:   Reviewed by clinician on day of visit: allergies, medications, problem list, medical history, surgical history, family history, social history, and previous encounter notes.  I, Tonye Pearson, am acting as Location manager for [employer], PA-C.   I have reviewed the above documentation for accuracy and completeness, and I agree with the above. Abby Potash, PA-C

## 2021-03-08 ENCOUNTER — Telehealth: Payer: Self-pay

## 2021-03-08 DIAGNOSIS — E118 Type 2 diabetes mellitus with unspecified complications: Secondary | ICD-10-CM

## 2021-03-08 MED ORDER — ONETOUCH DELICA PLUS LANCET33G MISC: 3 refills | Status: DC

## 2021-03-08 NOTE — Telephone Encounter (Signed)
Received faxed rx request for OneTouch Delica Plus 33 G lancets.  E-scribed new rx for [employer].

## 2021-03-18 ENCOUNTER — Other Ambulatory Visit: Payer: Self-pay | Admitting: Psychiatry

## 2021-03-20 ENCOUNTER — Encounter: Payer: Self-pay | Admitting: Internal Medicine

## 2021-03-22 ENCOUNTER — Encounter (INDEPENDENT_AMBULATORY_CARE_PROVIDER_SITE_OTHER): Payer: Self-pay | Admitting: Physician Assistant

## 2021-03-22 ENCOUNTER — Ambulatory Visit (INDEPENDENT_AMBULATORY_CARE_PROVIDER_SITE_OTHER): Payer: PPO | Admitting: Physician Assistant

## 2021-03-22 ENCOUNTER — Other Ambulatory Visit: Payer: Self-pay

## 2021-03-22 VITALS — BP 172/81 | HR 60 | Temp 98.1°F | Ht 63.0 in | Wt 175.0 lb

## 2021-03-22 DIAGNOSIS — I10 Essential (primary) hypertension: Secondary | ICD-10-CM

## 2021-03-22 DIAGNOSIS — E669 Obesity, unspecified: Secondary | ICD-10-CM

## 2021-03-22 DIAGNOSIS — E118 Type 2 diabetes mellitus with unspecified complications: Secondary | ICD-10-CM

## 2021-03-22 DIAGNOSIS — Z6831 Body mass index (BMI) 31.0-31.9, adult: Secondary | ICD-10-CM

## 2021-03-22 NOTE — Progress Notes (Signed)
Chief Complaint:   OBESITY [name] [name] is here to discuss her progress with her obesity treatment plan along with follow-up of her obesity related diagnoses. [name] [name] is on the Category 2 Plan and states she is following her eating plan approximately 40% of the time. [name] [name] states she is not currently exercising.  Today's visit was #: 5 Starting weight: 177 lbs Starting date: 02/02/2021 Today's weight: 175 lbs Today's date: 03/22/2021 Total lbs lost to date: 2 Total lbs lost since last in-office visit: 0  Interim History: [name] [name] reports starting off well after last visit and then getting off track. She has been eating out more often. She does not drink enough water.  Subjective:   1. Essential hypertension, benign [name] [name] has not taken her BP meds today. She is managed by cardiology. Pt reports rushing to her here. She denies headache or chest pain.  2. Controlled type 2 diabetes mellitus with complication, without long-term current use of insulin ([place]) [name] [name]'s last A1c was 6.2. She denies polyphagia. Pt is on Metformin once a day as PCP directed. Blood sugar this morning was 150.  Assessment/Plan:   1. Essential hypertension, benign [name] is working on healthy weight loss and exercise to improve blood pressure control. We will watch for signs of hypotension as she continues her lifestyle modifications. Pt advised to take medication prior to appointments. Follow up with cardiology.  2. Controlled type 2 diabetes mellitus with complication, without long-term current use of insulin (HCC) Good blood sugar control is important to decrease the likelihood of diabetic complications such as nephropathy, neuropathy, limb loss, blindness, coronary artery disease, and death. Intensive lifestyle modification including diet, exercise and weight loss are the first line of treatment for diabetes. Continue with plan, weight loss, and medications.  3. Obesity, current BMI 31.01  [name] [name] is currently in the  action stage of change. As such, her goal is to continue with weight loss efforts. She has agreed to the Category 2 Plan.   Exercise goals:  As is  Behavioral modification strategies: increasing water intake, decreasing eating out, and meal planning and cooking strategies.  [name] [name] has agreed to follow-up with our clinic in 2 weeks. She was informed of the importance of frequent follow-up visits to maximize her success with intensive lifestyle modifications for her multiple health conditions.   Objective:   Blood pressure (!) 172/81, pulse 60, temperature 98.1 F (36.7 C), height 5\' 3"  (1.6 m), weight 175 lb (79.4 kg), SpO2 98 %. Body mass index is 31 kg/m.  General: Cooperative, alert, well developed, in no acute distress. HEENT: Conjunctivae and lids unremarkable. Cardiovascular: Regular rhythm.  Lungs: Normal work of breathing. Neurologic: No focal deficits.   Lab Results  Component Value Date   CREATININE 0.82 02/02/2021   BUN 14 02/02/2021   NA 145 (H) 02/02/2021   K 3.9 02/02/2021   CL 104 02/02/2021   CO2 27 02/02/2021   Lab Results  Component Value Date   ALT 18 02/02/2021   AST 18 02/02/2021   ALKPHOS 64 02/02/2021   BILITOT 0.5 02/02/2021   Lab Results  Component Value Date   HGBA1C 6.2 (H) 02/02/2021   HGBA1C 6.2 (A) 10/21/2020   HGBA1C 5.9 (A) 07/22/2020   HGBA1C 6.6 (H) 02/10/2020   HGBA1C 6.4 (A) 01/28/2019   Lab Results  Component Value Date   INSULIN 26.1 (H) 02/02/2021   INSULIN 62.0 (H) 12/20/2016   INSULIN 66.8 (H) 09/05/2016   Lab Results  Component Value Date  TSH 1.050 02/02/2021   Lab Results  Component Value Date   CHOL 101 02/02/2021   HDL 49 02/02/2021   LDLCALC 31 02/02/2021   LDLDIRECT 77.7 12/27/2009   TRIG 117 02/02/2021   CHOLHDL 2 02/10/2020   Lab Results  Component Value Date   VD25OH 62.4 02/02/2021   VD25OH 80.69 02/10/2020   VD25OH 49.4 12/20/2016   Lab Results  Component Value Date   WBC 4.4 02/10/2020   HGB  12.4 02/10/2020   HCT 37.0 02/10/2020   MCV 84.9 02/10/2020   PLT 157.0 02/10/2020   Lab Results  Component Value Date   FERRITIN 37.3 12/29/2014    Obesity Behavioral Intervention:   Approximately 15 minutes were spent on the discussion below.  ASK: We discussed the diagnosis of obesity with [name] [name] today and [name] [name] agreed to give [place] permission to discuss obesity behavioral modification therapy today.  ASSESS: [name] [name] has the diagnosis of obesity and her BMI today is 31.1. [name] [name] is in the action stage of change.   ADVISE: [name] [name] was educated on the multiple health risks of obesity as well as the benefit of weight loss to improve her health. She was advised of the need for long term treatment and the importance of lifestyle modifications to improve her current health and to decrease her risk of future health problems.  AGREE: Multiple dietary modification options and treatment options were discussed and [name] [name] agreed to follow the recommendations documented in the above note.  ARRANGE: [name] [name] was educated on the importance of frequent visits to treat obesity as outlined per CMS and USPSTF guidelines and agreed to schedule her next follow up appointment today.  Attestation Statements:   Reviewed by clinician on day of visit: allergies, medications, problem list, medical history, surgical history, family history, social history, and previous encounter notes.  Coral Ceo, CMA, am acting as transcriptionist for [employer], PA-C.  I have reviewed the above documentation for accuracy and completeness, and I agree with the above. Abby Potash, PA-C

## 2021-03-29 DIAGNOSIS — Z01419 Encounter for gynecological examination (general) (routine) without abnormal findings: Secondary | ICD-10-CM | POA: Diagnosis not present

## 2021-03-29 DIAGNOSIS — Z124 Encounter for screening for malignant neoplasm of cervix: Secondary | ICD-10-CM | POA: Diagnosis not present

## 2021-03-29 DIAGNOSIS — Z01411 Encounter for gynecological examination (general) (routine) with abnormal findings: Secondary | ICD-10-CM | POA: Diagnosis not present

## 2021-03-29 DIAGNOSIS — Z6831 Body mass index (BMI) 31.0-31.9, adult: Secondary | ICD-10-CM | POA: Diagnosis not present

## 2021-04-03 ENCOUNTER — Other Ambulatory Visit: Payer: Self-pay

## 2021-04-03 ENCOUNTER — Ambulatory Visit
Admission: RE | Admit: 2021-04-03 | Discharge: 2021-04-03 | Disposition: A | Discharge status: Home / Self Care | Payer: PPO | Source: Ambulatory Visit | Attending: Family Medicine | Admitting: Family Medicine

## 2021-04-03 DIAGNOSIS — Z1231 Encounter for screening mammogram for malignant neoplasm of breast: Secondary | ICD-10-CM | POA: Diagnosis not present

## 2021-04-06 ENCOUNTER — Ambulatory Visit (INDEPENDENT_AMBULATORY_CARE_PROVIDER_SITE_OTHER): Payer: PPO | Admitting: Physician Assistant

## 2021-04-17 ENCOUNTER — Other Ambulatory Visit: Payer: Self-pay

## 2021-04-17 ENCOUNTER — Encounter (INDEPENDENT_AMBULATORY_CARE_PROVIDER_SITE_OTHER): Payer: Self-pay | Admitting: Physician Assistant

## 2021-04-17 ENCOUNTER — Ambulatory Visit (INDEPENDENT_AMBULATORY_CARE_PROVIDER_SITE_OTHER): Payer: PPO | Admitting: Physician Assistant

## 2021-04-17 VITALS — BP 118/72 | HR 65 | Temp 98.2°F | Ht 63.0 in | Wt 177.0 lb

## 2021-04-17 DIAGNOSIS — Z6831 Body mass index (BMI) 31.0-31.9, adult: Secondary | ICD-10-CM | POA: Diagnosis not present

## 2021-04-17 DIAGNOSIS — E669 Obesity, unspecified: Secondary | ICD-10-CM | POA: Diagnosis not present

## 2021-04-17 DIAGNOSIS — E1169 Type 2 diabetes mellitus with other specified complication: Secondary | ICD-10-CM | POA: Diagnosis not present

## 2021-04-17 DIAGNOSIS — E785 Hyperlipidemia, unspecified: Secondary | ICD-10-CM

## 2021-04-17 DIAGNOSIS — E118 Type 2 diabetes mellitus with unspecified complications: Secondary | ICD-10-CM | POA: Diagnosis not present

## 2021-04-17 NOTE — Progress Notes (Signed)
Chief Complaint:   OBESITY [name] [name] is here to discuss her progress with her obesity treatment plan along with follow-up of her obesity related diagnoses. [name] [name] is on the Category 2 Plan and states she is following her eating plan approximately 0% of the time. [name] [name] states she is doing 0 minutes 0 times per week.  Today's visit was #: 6 Starting weight: 177 lbs Starting date: 02/02/2021 Today's weight: 177 lbs Today's date: 04/17/2021 Total lbs lost to date: 0 Total lbs lost since last in-office visit: 0  Interim History: [name] [name] was taking care of her husband after he had surgery, and ate whatever he was feeling like. She did not follow the plan and she has been having cravings.  Subjective:   1. Controlled type 2 diabetes mellitus with complication, without long-term current use of insulin (HCC) [name] [name] is on metformin, and her last A1c was 6.2.  2. Hyperlipidemia associated with type 2 diabetes mellitus ([place]) [name] [name] is on Crestor, and she is tolerating it well. Last lipid panel was at goal. She is followed by Cardiology.  Assessment/Plan:   1. Controlled type 2 diabetes mellitus with complication, without long-term current use of insulin (HCC) Carling will continue her medications, and meal plan. Good blood sugar control is important to decrease the likelihood of diabetic complications such as nephropathy, neuropathy, limb loss, blindness, coronary artery disease, and death. Intensive lifestyle modification including diet, exercise and weight loss are the first line of treatment for diabetes.   2. Hyperlipidemia associated with type 2 diabetes mellitus ([place]) Cardiovascular risk and specific lipid/LDL goals reviewed. We discussed several lifestyle modifications today. [name] [name] will continue with her medications, and she will follow up with Cardiology. She will continue with diet, exercise and weight loss efforts. Orders and follow up as documented in patient record.   Counseling Intensive  lifestyle modifications are the first line treatment for this issue. Dietary changes: Increase soluble fiber. Decrease simple carbohydrates. Exercise changes: Moderate to vigorous-intensity aerobic activity 150 minutes per week if tolerated. Lipid-lowering medications: see documented in medical record.  3. Obesity, current BMI 31.36 [name] [name] is currently in the action stage of change. As such, her goal is to continue with weight loss efforts. She has agreed to the Category 2 Plan.   Exercise goals: No exercise has been prescribed at this time.  Behavioral modification strategies: meal planning and cooking strategies and keeping healthy foods in the home.  [name] [name] has agreed to follow-up with our clinic in 2 to 3 weeks. She was informed of the importance of frequent follow-up visits to maximize her success with intensive lifestyle modifications for her multiple health conditions.   Objective:   Blood pressure 118/72, pulse 65, temperature 98.2 F (36.8 C), height 5\' 3"  (1.6 m), weight 177 lb (80.3 kg), SpO2 96 %. Body mass index is 31.35 kg/m.  General: Cooperative, alert, well developed, in no acute distress. HEENT: Conjunctivae and lids unremarkable. Cardiovascular: Regular rhythm.  Lungs: Normal work of breathing. Neurologic: No focal deficits.   Lab Results  Component Value Date   CREATININE 0.82 02/02/2021   BUN 14 02/02/2021   NA 145 (H) 02/02/2021   K 3.9 02/02/2021   CL 104 02/02/2021   CO2 27 02/02/2021   Lab Results  Component Value Date   ALT 18 02/02/2021   AST 18 02/02/2021   ALKPHOS 64 02/02/2021   BILITOT 0.5 02/02/2021   Lab Results  Component Value Date   HGBA1C 6.2 (H) 02/02/2021   HGBA1C 6.2 (  A) 10/21/2020   HGBA1C 5.9 (A) 07/22/2020   HGBA1C 6.6 (H) 02/10/2020   HGBA1C 6.4 (A) 01/28/2019   Lab Results  Component Value Date   INSULIN 26.1 (H) 02/02/2021   INSULIN 62.0 (H) 12/20/2016   INSULIN 66.8 (H) 09/05/2016   Lab Results  Component Value  Date   TSH 1.050 02/02/2021   Lab Results  Component Value Date   CHOL 101 02/02/2021   HDL 49 02/02/2021   LDLCALC 31 02/02/2021   LDLDIRECT 77.7 12/27/2009   TRIG 117 02/02/2021   CHOLHDL 2 02/10/2020   Lab Results  Component Value Date   VD25OH 62.4 02/02/2021   VD25OH 80.69 02/10/2020   VD25OH 49.4 12/20/2016   Lab Results  Component Value Date   WBC 4.4 02/10/2020   HGB 12.4 02/10/2020   HCT 37.0 02/10/2020   MCV 84.9 02/10/2020   PLT 157.0 02/10/2020   Lab Results  Component Value Date   FERRITIN 37.3 12/29/2014    Obesity Behavioral Intervention:   Approximately 15 minutes were spent on the discussion below.  ASK: We discussed the diagnosis of obesity with Anaissa today and Aveyah agreed to give [place] permission to discuss obesity behavioral modification therapy today.  ASSESS: [name] [name] has the diagnosis of obesity and her BMI today is 31.4. [name] [name] is in the action stage of change.   ADVISE: [name] [name] was educated on the multiple health risks of obesity as well as the benefit of weight loss to improve her health. She was advised of the need for long term treatment and the importance of lifestyle modifications to improve her current health and to decrease her risk of future health problems.  AGREE: Multiple dietary modification options and treatment options were discussed and [name] [name] agreed to follow the recommendations documented in the above note.  ARRANGE: [name] [name] was educated on the importance of frequent visits to treat obesity as outlined per CMS and USPSTF guidelines and agreed to schedule her next follow up appointment today.  Attestation Statements:   Reviewed by clinician on day of visit: allergies, medications, problem list, medical history, surgical history, family history, social history, and previous encounter notes.   Wilhemena Durie, am acting as transcriptionist for [employer], PA-C.  I have reviewed the above documentation for accuracy and  completeness, and I agree with the above. Abby Potash, PA-C

## 2021-04-19 ENCOUNTER — Encounter (INDEPENDENT_AMBULATORY_CARE_PROVIDER_SITE_OTHER): Payer: Self-pay

## 2021-05-10 ENCOUNTER — Encounter: Payer: Self-pay | Admitting: Family Medicine

## 2021-05-10 DIAGNOSIS — J329 Chronic sinusitis, unspecified: Secondary | ICD-10-CM

## 2021-05-10 MED ORDER — FLUTICASONE PROPIONATE 50 MCG/ACT NA SUSP
2.0000 | Freq: Every day | NASAL | 3 refills | Status: DC
Start: 2021-05-10 — End: 2022-11-26

## 2021-05-10 NOTE — Telephone Encounter (Signed)
E-scribed refill 

## 2021-05-11 ENCOUNTER — Ambulatory Visit (INDEPENDENT_AMBULATORY_CARE_PROVIDER_SITE_OTHER): Payer: PPO | Admitting: Physician Assistant

## 2021-05-15 ENCOUNTER — Telehealth: Payer: Self-pay | Admitting: Family Medicine

## 2021-05-15 NOTE — Chronic Care Management (AMB) (Signed)
  Chronic Care Management   Outreach Note  05/15/2021 Name: Audrena Talaga MRN: [id number] DOB: 05/09/1951  Referred by: Ria Bush, MD Reason for referral : No chief complaint on file.   An unsuccessful telephone outreach was attempted today. The patient was referred to the pharmacist for assistance with care management and care coordination.   Follow Up Plan:   Tatjana Dellinger Upstream Scheduler

## 2021-05-17 ENCOUNTER — Ambulatory Visit: Payer: PPO | Admitting: Psychiatry

## 2021-05-17 ENCOUNTER — Other Ambulatory Visit: Payer: Self-pay

## 2021-05-17 ENCOUNTER — Encounter: Payer: Self-pay | Admitting: Psychiatry

## 2021-05-17 DIAGNOSIS — F423 Hoarding disorder: Secondary | ICD-10-CM | POA: Diagnosis not present

## 2021-05-17 DIAGNOSIS — F3342 Major depressive disorder, recurrent, in full remission: Secondary | ICD-10-CM

## 2021-05-17 MED ORDER — BUPROPION HCL ER (XL) 150 MG PO TB24: 450.0000 mg | ORAL_TABLET | Freq: Every day | ORAL | 3 refills | Status: DC

## 2021-05-17 NOTE — Progress Notes (Signed)
[name] [name] [id number] [birth year] 70 y.o.  Subjective:   Patient ID:  [name] [name] is a 70 y.o. (DOB 12-15-1950) female.  Chief Complaint:  Chief Complaint  Patient presents with   Follow-up   Depression    Depression        Associated symptoms include no decreased concentration and no suicidal ideas. [name] [name] presents to the office today for follow-up of depression and med change.   When seen December. Recommended NAC  And stop naltrexone to see if it was causing GI problems.  Rec counseling for hoarding but she didn't go.  seen June 2020 & 05/2019.  No meds were changed.  Only taking Wellbutrin XL 450 mg daily from here.  Following noted: Some therapy with  Windle Guard about hoarding but it never got followed up on it.  He doesn't respond to phone calls or emails.  Did accomplish some with hoarding bc no anxiety with it but he helped her get some of it done. Retirement date Mar 15, 2018 and used to it now.  All these years she's neglected her house but doesn't do anything about it.  Some hoarding.  Doesn't like to stay at home.  No where to put anything in the house bc the closets are full.  Can't get motivated to do anything about it.  Thinks it's motivation and uncertainty over what to do with things.  Won't let people visit bc she's embarrassed.  Doesn't like the house.  H won't move and won't help get rid of things.  05/17/20 appt with following noted: Still only on Wellbutrin 450 mg daily. No SE. Retired and [employer] keeping her busy, sitting 2-3 times per week.   Hard adjustment with retirement initially resolved.  Not depressed. Plan: [name] [name]'s depression is controlled and she is satisfied with the medications, but wonders about decreasing it.  OK to reduce Welllbutrin to 300 mg daily.  05/17/2021 appointment with the following noted: Didn't work to reduce Wellbutrin to 300 mg daily with more depression and irritability. D noticed too.  It's amazing how it works with more  Wellbutrin to 450 mg daily. Picks on H too much if not taking Wellbutrin.    Patient reports stable mood and denies depressed or irritable moods.  Patient denies any recent difficulty with anxiety.  Patient denies difficulty with sleep initiation or maintenance. Denies appetite disturbance.  Patient reports that energy has been good.  Motivation to clean is not good. Patient denies any difficulty with concentration.  Patient denies any suicidal ideation.   Past Psychiatric Medication Trials: Wellbutrin XL 450 + Vraylar 1.5, Abilify 10,  Xanax She has been on Wellbutrin from this practice since 2004 Naltrexone NAC GI  Review of Systems:  Review of Systems  Cardiovascular:  Negative for palpitations.  Musculoskeletal:  Positive for arthralgias and back pain.  Neurological:  Negative for tremors and weakness.  Psychiatric/Behavioral:  Negative for agitation, behavioral problems, confusion, decreased concentration, dysphoric mood, hallucinations, self-injury, sleep disturbance and suicidal ideas. The patient is not nervous/anxious and is not hyperactive.    Medications: I have reviewed the patient's current medications.  Current Outpatient Medications  Medication Sig Dispense Refill   albuterol (VENTOLIN HFA) 108 (90 Base) MCG/ACT inhaler Inhale 1-2 puffs into the lungs every 6 (six) hours as needed for wheezing or shortness of breath. 1 each 0   aspirin 81 MG tablet Take 81 mg by mouth daily.     Calcium Carbonate-Vitamin D 600-400 MG-UNIT tablet Take 2  tablets 2 (two) times daily by mouth.      carvedilol (COREG) 12.5 MG tablet TAKE 1 AND 1/2 TABLETS(18.75 MG) BY MOUTH TWICE DAILY 270 tablet 3   Cholecalciferol (VITAMIN D3) 3000 units TABS Take 1 tablet by mouth daily. 30 tablet    cloNIDine (CATAPRES) 0.1 MG tablet TAKE 1 TABLET(0.1 MG) BY MOUTH TWICE DAILY 180 tablet 2   fluticasone (FLONASE) 50 MCG/ACT nasal spray Place 2 sprays into both nostrils daily. 16 g 3   glucose blood  (ONETOUCH ULTRA) test strip Use as instructed to check blood sugar once daily. 100 each 3   hydrALAZINE (APRESOLINE) 100 MG tablet Take 1 tablet (100 mg total) by mouth in the morning and at bedtime. 180 tablet 1   Lancets (ONETOUCH DELICA PLUS NWGNFA21H) MISC Check blood sugar once daily 100 each 3   metFORMIN (GLUCOPHAGE-XR) 750 MG 24 hr tablet TAKE 1 TABLET(750 MG) BY MOUTH TWICE DAILY WITH A MEAL 180 tablet 3   Multiple Vitamins-Minerals (MULTIVITAMIN ADULTS 50+ PO) Take 1 tablet every morning by mouth.      olmesartan-hydrochlorothiazide (BENICAR HCT) 40-12.5 MG tablet TAKE 1 TABLET BY MOUTH DAILY 90 tablet 3   pantoprazole (PROTONIX) 40 MG tablet TAKE 1 TABLET(40 MG) BY MOUTH EVERY MORNING 90 tablet 3   polyethylene glycol (MIRALAX / GLYCOLAX) 17 g packet Take 17 g by mouth daily.     Probiotic Product (PROBIOTIC DAILY PO) Take 1 capsule every morning by mouth.      rosuvastatin (CRESTOR) 20 MG tablet TAKE 1 TABLET(20 MG) BY MOUTH DAILY 90 tablet 3   blood glucose meter kit and supplies KIT Dispense based on patient and insurance preference. Use up to four times daily as directed. (FOR ICD-9 250.00, 250.01). 1 each 0   buPROPion (WELLBUTRIN XL) 150 MG 24 hr tablet Take 3 tablets (450 mg total) by mouth daily. 270 tablet 3   Current Facility-Administered Medications  Medication Dose Route Frequency Provider Last Rate Last Admin   alum & mag hydroxide-simeth (MAALOX/MYLANTA) 200-200-20 MG/5ML suspension 10 mL  10 mL Oral Once Ria Bush, MD        Medication Side Effects: None  Allergies: No Known Allergies  Past Medical History:  Diagnosis Date   Acquired deformity of right foot    Back pain    CAD (coronary artery disease) primary cardioloigst-  dr Stanford Breed   hx NSTEMI 10-24-2001  s/p  cardiac cath w/ PCI and DES x1 to first diagonal   Chronic constipation    Constipation    COVID-19 06/02/2020   Depression    Fatty liver 04/26/2015   GERD (gastroesophageal reflux  disease)    History of abnormal cervical Pap smear    ACUS 2012   History of exercise stress test 05-25-2014  dr Stanford Breed   borderline (indeterminate) with non-specific ST changes (mild ST depression in leads II,III, aVF, & V6, did not meet criteria for ischemia) ;  pt had low intolerence, no cp, normal heartrate and bp response, no arrhythmias   History of goiter    as child s/p  removal ,  per pt benign , neck area  (not thyroid)   History of heart attack    History of non-ST elevation myocardial infarction (NSTEMI) 10/24/2001   s/p  PCI and DES to first diagonal   Hyperlipidemia    Hypertension    Joint pain    OSA on CPAP pulmoloigst-  dr young   per study 11-07-2004  severe osa  Right foot pain    S/P drug eluting coronary stent placement 10/27/2001   x1 to first diagonal    Seasonal and perennial allergic rhinitis    SOB (shortness of breath)    Type 2 diabetes mellitus ([place])    Vitamin D deficiency    Wears dentures    upper and lower partial denture   Wears glasses     Family History  Problem Relation Age of Onset   Cancer Mother        throat, cervical   Diabetes Mother    Stroke Mother    Heart attack Mother    Heart disease Mother    Hypertension Mother    Thyroid disease Mother    Alcoholism Mother    Liver disease Mother    Cancer Father        prostate   Diabetes Sister    Heart attack Sister    Hypertension Sister    Hyperlipidemia Sister    Colon cancer Neg Hx    Breast cancer Neg Hx     Social History   Socioeconomic History   Marital status: Married    Spouse name: Jeneen Rinks   Number of children: 3   Years of education: Not on file   Highest education level: Not on file  Occupational History   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    Employer: [employer]   Occupation: Retired  Tobacco Use   Smoking status: Former    Packs/day: 1.50    Years: 48.00    Pack years: 72.00    Types: Cigarettes     Quit date: 09/23/2012    Years since quitting: 8.6   Smokeless tobacco: Never  Vaping Use   Vaping Use: Never used  Substance and Sexual Activity   Alcohol use: Yes    Alcohol/week: 0.0 standard drinks    Comment: occasional wine   Drug use: No   Sexual activity: Not Currently  Other Topics Concern   Not on file  Social History Narrative   Caffeine Use:  2 cups coffee daily   Regular exercise:  No      Social Determinants of Health   Financial Resource Strain: Not on file  Food Insecurity: Not on file  Transportation Needs: Not on file  Physical Activity: Not on file  Stress: Not on file  Social Connections: Not on file  Intimate Partner Violence: Not on file    Past Medical History, Surgical history, Social history, and Family history were reviewed and updated as appropriate.   Please see review of systems for further details on the patient's review from today.   Objective:   Physical Exam:  There were no vitals taken for this visit.  Physical Exam Constitutional:      General: She is not in acute distress.    Appearance: She is well-developed. She is obese.  Musculoskeletal:        General: No deformity.  Neurological:     Mental Status: She is alert and oriented to person, place, and time.     Motor: No tremor.     Coordination: Coordination normal.     Gait: Gait normal.  Psychiatric:        Attention and Perception: She is attentive.        Mood and Affect: Mood is not anxious or depressed. Affect is not labile, blunt, angry or inappropriate.        Speech: Speech normal.  Behavior: Behavior normal.        Thought Content: Thought content normal. Thought content is not paranoid. Thought content does not include homicidal or suicidal ideation.        Judgment: Judgment normal.     Comments: Insight intact. No auditory or visual hallucinations. No delusions.      Lab Review:     Component Value Date/Time   NA 145 (H) 02/02/2021 0914   K 3.9  02/02/2021 0914   CL 104 02/02/2021 0914   CO2 27 02/02/2021 0914   GLUCOSE 141 (H) 02/02/2021 0914   GLUCOSE 122 (H) 02/10/2020 1034   BUN 14 02/02/2021 0914   CREATININE 0.82 02/02/2021 0914   CREATININE 0.70 12/15/2014 1949   CALCIUM 9.6 02/02/2021 0914   PROT 6.5 02/02/2021 0914   ALBUMIN 4.3 02/02/2021 0914   AST 18 02/02/2021 0914   ALT 18 02/02/2021 0914   ALKPHOS 64 02/02/2021 0914   BILITOT 0.5 02/02/2021 0914   GFRNONAA 80 06/04/2019 1038   GFRNONAA 80 10/27/2013 0910   GFRAA 93 06/04/2019 1038   GFRAA >89 10/27/2013 0910       Component Value Date/Time   WBC 4.4 02/10/2020 1034   RBC 4.36 02/10/2020 1034   HGB 12.4 02/10/2020 1034   HGB 13.3 09/05/2016 1202   HCT 37.0 02/10/2020 1034   HCT 42.1 09/05/2016 1202   PLT 157.0 02/10/2020 1034   MCV 84.9 02/10/2020 1034   MCV 89 09/05/2016 1202   MCH 28.0 09/05/2016 1202   MCH 29.2 05/09/2011 0931   MCHC 33.4 02/10/2020 1034   RDW 15.6 (H) 02/10/2020 1034   RDW 15.7 (H) 09/05/2016 1202   LYMPHSABS 1.6 02/10/2020 1034   LYMPHSABS 1.7 09/05/2016 1202   MONOABS 0.3 02/10/2020 1034   EOSABS 0.1 02/10/2020 1034   EOSABS 0.1 09/05/2016 1202   BASOSABS 0.0 02/10/2020 1034   BASOSABS 0.0 09/05/2016 1202    No results found for: POCLITH, LITHIUM   No results found for: PHENYTOIN, PHENOBARB, VALPROATE, CBMZ   .res Assessment: Plan:    Recurrent major depression in full remission ([place]) - Plan: buPROPion (WELLBUTRIN XL) 150 MG 24 hr tablet  Hoarding disorder with excessive acquisition   [name] [name]'s depression is controlled and she is satisfied with the medications,  Failed attempt to reduce Welllbutrin to 300 mg daily, therefore continue 450 mg AM. Disc risk relapse.  Problems solving around hoarding.  Needs activity to help structure her time.  Then needs strategies to deal with the hoarding, problem solved around that issue.    Disc proper sleep amounts.  Feels better if doesn't oversleep.  15-minute  appointment  FU 12 mos.  Lynder Parents, MD, DFAPA  Please see After Visit Summary for patient specific instructions.  Future Appointments  Date Time Provider [place]  06/13/2021 10:30 AM [facility] PREVISIT RM 52 [facility]  06/14/2021 10:30 AM Abby Potash, PA-C [facility] None  06/27/2021 11:00 AM Sharyn Creamer, MD [facility]  08/30/2021 10:00 AM Ria Bush, MD [facility]  09/01/2021 11:20 AM Lelon Perla, MD [facility]  02/21/2022 11:15 AM [facility] NURSE HEALTH ADVISOR [facility]    No orders of the defined types were placed in this encounter.     -------------------------------

## 2021-05-18 ENCOUNTER — Ambulatory Visit (INDEPENDENT_AMBULATORY_CARE_PROVIDER_SITE_OTHER): Payer: PPO | Admitting: Physician Assistant

## 2021-05-22 ENCOUNTER — Telehealth: Payer: Self-pay | Admitting: Family Medicine

## 2021-05-22 NOTE — Chronic Care Management (AMB) (Signed)
  Chronic Care Management   Outreach Note  05/22/2021 Name: [name] [name] MRN: [id number] DOB: [birth year]  Referred by: Ria Bush, MD Reason for referral : No chief complaint on file.   A second unsuccessful telephone outreach was attempted today. The patient was referred to pharmacist for assistance with care management and care coordination.  Follow Up Plan:   Tatjana Dellinger Upstream Scheduler

## 2021-05-23 ENCOUNTER — Ambulatory Visit (INDEPENDENT_AMBULATORY_CARE_PROVIDER_SITE_OTHER): Payer: PPO | Admitting: Physician Assistant

## 2021-05-29 ENCOUNTER — Telehealth: Payer: Self-pay | Admitting: Family Medicine

## 2021-05-29 NOTE — Chronic Care Management (AMB) (Signed)
°  Chronic Care Management   Note  05/29/2021 Name: [name] [name] MRN: [id number] DOB: 1950-07-19  [name] [name] is a 70 y.o. year old female who is a primary care patient of Ria Bush, MD. I reached out to Tristan Schroeder by phone today in response to a referral sent by Ms. Jamelle Rushing Mcclish's PCP, Ria Bush, MD.   Ms. Gilkey was given information about Chronic Care Management services today including:  CCM service includes personalized support from designated clinical staff supervised by her physician, including individualized plan of care and coordination with other care providers 24/7 contact phone numbers for assistance for urgent and routine care needs. Service will only be billed when office clinical staff spend 20 minutes or more in a month to coordinate care. Only one practitioner may furnish and bill the service in a calendar month. The patient may stop CCM services at any time (effective at the end of the month) by phone call to the office staff.   Patient agreed to services and verbal consent obtained.   Follow up plan:   Tatjana Secretary/administrator

## 2021-05-31 ENCOUNTER — Encounter: Payer: PPO | Admitting: Internal Medicine

## 2021-06-11 HISTORY — PX: COLONOSCOPY: SHX174

## 2021-06-13 ENCOUNTER — Other Ambulatory Visit: Payer: Self-pay

## 2021-06-13 ENCOUNTER — Ambulatory Visit (AMBULATORY_SURGERY_CENTER): Payer: PPO

## 2021-06-13 VITALS — Ht 63.0 in | Wt 179.0 lb

## 2021-06-13 DIAGNOSIS — Z1211 Encounter for screening for malignant neoplasm of colon: Secondary | ICD-10-CM

## 2021-06-13 NOTE — Progress Notes (Signed)
° ° °  Patient's pre-visit was done today over the phone with the patient   Name,DOB and address verified.   Patient denies any allergies to Eggs and Soy.  Patient denies any problems with anesthesia/sedation. Patient denies taking diet pills or blood thinners.  Denies atrial flutter or atrial fib  No home Oxygen.   Packet of Prep instructions mailed to patient including a copy of a consent form-pt is aware.  Patient understands to call us back with any questions or concerns.  Patient is aware of our care-partner policy and OVZCH-[id number] safety protocol.         Pt has chronic constipation, changed to 2 day prep.

## 2021-06-14 ENCOUNTER — Encounter (INDEPENDENT_AMBULATORY_CARE_PROVIDER_SITE_OTHER): Payer: Self-pay | Admitting: Physician Assistant

## 2021-06-14 ENCOUNTER — Ambulatory Visit (INDEPENDENT_AMBULATORY_CARE_PROVIDER_SITE_OTHER): Payer: PPO | Admitting: Physician Assistant

## 2021-06-14 VITALS — BP 108/60 | HR 66 | Temp 97.7°F | Ht 63.0 in | Wt 179.0 lb

## 2021-06-14 DIAGNOSIS — Z6831 Body mass index (BMI) 31.0-31.9, adult: Secondary | ICD-10-CM | POA: Diagnosis not present

## 2021-06-14 DIAGNOSIS — E669 Obesity, unspecified: Secondary | ICD-10-CM | POA: Diagnosis not present

## 2021-06-14 DIAGNOSIS — E118 Type 2 diabetes mellitus with unspecified complications: Secondary | ICD-10-CM | POA: Diagnosis not present

## 2021-06-14 NOTE — Progress Notes (Signed)
Chief Complaint:   OBESITY Savera is here to discuss her progress with her obesity treatment plan along with follow-up of her obesity related diagnoses. Mariposa is on the Category 2 Plan and states she is following her eating plan approximately 0% of the time. Nikie states she is doing 0 minutes 0 times per week.  Today's visit was #: 7 Starting weight: 177 lbs Starting date: 02/02/2021 Today's weight: 179 lbs Today's date: 06/14/2021 Total lbs lost to date: 0 Total lbs lost since last in-office visit: 0  Interim History: Aymar reports that she did not follow the plan over the holidays. She is feeling more tired. She is ready to get back on track.  Subjective:   1. Controlled type 2 diabetes mellitus with complication, without long-term current use of insulin ([place]) Aristea's last A1c was 6.2. Her fasting blood sugars range between 160-170's. She is on metformin once daily (per her primary care physician).  Assessment/Plan:   1. Controlled type 2 diabetes mellitus with complication, without long-term current use of insulin ([place]) Naylene will continue with her medications and meal plan, and she will follow up with her primary care physician as directed. Good blood sugar control is important to decrease the likelihood of diabetic complications such as nephropathy, neuropathy, limb loss, blindness, coronary artery disease, and death. Intensive lifestyle modification including diet, exercise and weight loss are the first line of treatment for diabetes.   2. Obesity, current BMI 31.72 Alyn is currently in the action stage of change. As such, her goal is to continue with weight loss efforts. She has agreed to the Category 2 Plan.   We will recheck fasting labs at her next visit.  Exercise goals: No exercise has been prescribed at this time.  Behavioral modification strategies: decreasing simple carbohydrates and meal planning and cooking strategies.  Nikhita has agreed to follow-up with our  clinic in 2 weeks. She was informed of the importance of frequent follow-up visits to maximize her success with intensive lifestyle modifications for her multiple health conditions.   Objective:   Blood pressure 108/60, pulse 66, temperature 97.7 F (36.5 C), height 5\' 3"  (1.6 m), weight 179 lb (81.2 kg), SpO2 96 %. Body mass index is 31.71 kg/m.  General: Cooperative, alert, well developed, in no acute distress. HEENT: Conjunctivae and lids unremarkable. Cardiovascular: Regular rhythm.  Lungs: Normal work of breathing. Neurologic: No focal deficits.   Lab Results  Component Value Date   CREATININE 0.82 02/02/2021   BUN 14 02/02/2021   NA 145 (H) 02/02/2021   K 3.9 02/02/2021   CL 104 02/02/2021   CO2 27 02/02/2021   Lab Results  Component Value Date   ALT 18 02/02/2021   AST 18 02/02/2021   ALKPHOS 64 02/02/2021   BILITOT 0.5 02/02/2021   Lab Results  Component Value Date   HGBA1C 6.2 (H) 02/02/2021   HGBA1C 6.2 (A) 10/21/2020   HGBA1C 5.9 (A) 07/22/2020   HGBA1C 6.6 (H) 02/10/2020   HGBA1C 6.4 (A) 01/28/2019   Lab Results  Component Value Date   INSULIN 26.1 (H) 02/02/2021   INSULIN 62.0 (H) 12/20/2016   INSULIN 66.8 (H) 09/05/2016   Lab Results  Component Value Date   TSH 1.050 02/02/2021   Lab Results  Component Value Date   CHOL 101 02/02/2021   HDL 49 02/02/2021   LDLCALC 31 02/02/2021   LDLDIRECT 77.7 12/27/2009   TRIG 117 02/02/2021   CHOLHDL 2 02/10/2020   Lab Results  Component Value Date   VD25OH 62.4 02/02/2021   VD25OH 80.69 02/10/2020   VD25OH 49.4 12/20/2016   Lab Results  Component Value Date   WBC 4.4 02/10/2020   HGB 12.4 02/10/2020   HCT 37.0 02/10/2020   MCV 84.9 02/10/2020   PLT 157.0 02/10/2020   Lab Results  Component Value Date   FERRITIN 37.3 12/29/2014    Obesity Behavioral Intervention:   Approximately 15 minutes were spent on the discussion below.  ASK: We discussed the diagnosis of obesity with Trinitie today  and Rajni agreed to give [place] permission to discuss obesity behavioral modification therapy today.  ASSESS: Lilas has the diagnosis of obesity and her BMI today is 31.7. Mckinleigh is in the action stage of change.   ADVISE: Suman was educated on the multiple health risks of obesity as well as the benefit of weight loss to improve her health. She was advised of the need for long term treatment and the importance of lifestyle modifications to improve her current health and to decrease her risk of future health problems.  AGREE: Multiple dietary modification options and treatment options were discussed and Meda agreed to follow the recommendations documented in the above note.  ARRANGE: Delanie was educated on the importance of frequent visits to treat obesity as outlined per CMS and USPSTF guidelines and agreed to schedule her next follow up appointment today.  Attestation Statements:   Reviewed by clinician on day of visit: allergies, medications, problem list, medical history, surgical history, family history, social history, and previous encounter notes.   Wilhemena Durie, am acting as transcriptionist for [employer], PA-C.  I have reviewed the above documentation for accuracy and completeness, and I agree with the above. Abby Potash, PA-C

## 2021-06-21 ENCOUNTER — Encounter: Payer: Self-pay | Admitting: Internal Medicine

## 2021-06-22 ENCOUNTER — Encounter: Payer: Self-pay | Admitting: Internal Medicine

## 2021-06-27 ENCOUNTER — Other Ambulatory Visit: Payer: Self-pay

## 2021-06-27 ENCOUNTER — Encounter: Payer: Self-pay | Admitting: Internal Medicine

## 2021-06-27 ENCOUNTER — Ambulatory Visit (AMBULATORY_SURGERY_CENTER): Payer: PPO | Admitting: Internal Medicine

## 2021-06-27 VITALS — BP 118/64 | HR 60 | Temp 97.5°F | Resp 20 | Ht 63.0 in | Wt 179.0 lb

## 2021-06-27 DIAGNOSIS — F32A Depression, unspecified: Secondary | ICD-10-CM | POA: Diagnosis not present

## 2021-06-27 DIAGNOSIS — D123 Benign neoplasm of transverse colon: Secondary | ICD-10-CM

## 2021-06-27 DIAGNOSIS — Z1211 Encounter for screening for malignant neoplasm of colon: Secondary | ICD-10-CM

## 2021-06-27 DIAGNOSIS — I2510 Atherosclerotic heart disease of native coronary artery without angina pectoris: Secondary | ICD-10-CM | POA: Diagnosis not present

## 2021-06-27 MED ORDER — SODIUM CHLORIDE 0.9 % IV SOLN
500.0000 mL | Freq: Once | INTRAVENOUS | Status: DC
Start: 2021-06-27 — End: 2021-06-27

## 2021-06-27 NOTE — Progress Notes (Signed)
Called to room to assist during endoscopic procedure.  Patient ID and intended procedure confirmed with present staff. Received instructions for my participation in the procedure from the performing physician.  

## 2021-06-27 NOTE — Op Note (Signed)
[place] Patient Name: [name] [name] Procedure Date: 06/27/2021 11:22 AM MRN: [id number] Endoscopist: Sonny Masters "[name] [name] ,  Age: 71 Referring MD:  Date of Birth: 03-30-1951 Gender: Female Account #: [id number] Procedure:                Colonoscopy Indications:              Screening for colorectal malignant neoplasm Medicines:                Monitored Anesthesia Care Procedure:                Pre-Anesthesia Assessment:                           - Prior to the procedure, a History and Physical                            was performed, and patient medications and                            allergies were reviewed. The patient's tolerance of                            previous anesthesia was also reviewed. The risks                            and benefits of the procedure and the sedation                            options and risks were discussed with the patient.                            All questions were answered, and informed consent                            was obtained. Prior Anticoagulants: The patient has                            taken no previous anticoagulant or antiplatelet                            agents. ASA Grade Assessment: III - A patient with                            severe systemic disease. After reviewing the risks                            and benefits, the patient was deemed in                            satisfactory condition to undergo the procedure.                           After obtaining informed consent, the colonoscope  was passed under direct vision. Throughout the                            procedure, the patient's blood pressure, pulse, and                            oxygen saturations were monitored continuously. The                            CF [id number] #[id number] was introduced through the anus                            and advanced to the the terminal ileum. The                            colonoscopy was  performed without difficulty. The                            patient tolerated the procedure well. The quality                            of the bowel preparation was good. The terminal                            ileum, ileocecal valve, appendiceal orifice, and                            rectum were photographed. Scope In: 11:27:57 AM Scope Out: 11:43:33 AM Scope Withdrawal Time: 0 hours 10 minutes 22 seconds  Total Procedure Duration: 0 hours 15 minutes 36 seconds  Findings:                 The terminal ileum appeared normal.                           Three sessile polyps were found in the transverse                            colon. The polyps were 1 to 2 mm in size. These                            polyps were removed with a cold snare. Resection                            and retrieval were complete.                           A few small-mouthed diverticula were found in the                            sigmoid colon.                           Non-bleeding internal hemorrhoids were found during  retroflexion. Complications:            No immediate complications. Estimated Blood Loss:     Estimated blood loss was minimal. Impression:               - The examined portion of the ileum was normal.                           - Three 1 to 2 mm polyps in the transverse colon,                            removed with a cold snare. Resected and retrieved.                           - Diverticulosis in the sigmoid colon.                           - Non-bleeding internal hemorrhoids. Recommendation:           - Discharge patient to home (with escort).                           - Await pathology results.                           - The findings and recommendations were discussed                            with the patient. Sonny Masters "[name] [name],  06/27/2021 11:46:36 AM

## 2021-06-27 NOTE — Patient Instructions (Signed)
Handout on Hemorrhoids and polyps provided.  Await pathology results.   YOU HAD AN ENDOSCOPIC PROCEDURE TODAY AT [facility]:   Refer to the procedure report that was given to you for any specific questions about what was found during the examination.  If the procedure report does not answer your questions, please call your gastroenterologist to clarify.  If you requested that your care partner not be given the details of your procedure findings, then the procedure report has been included in a sealed envelope for you to review at your convenience later.  YOU SHOULD EXPECT: Some feelings of bloating in the abdomen. Passage of more gas than usual.  Walking can help get rid of the air that was put into your GI tract during the procedure and reduce the bloating. If you had a lower endoscopy (such as a colonoscopy or flexible sigmoidoscopy) you may notice spotting of blood in your stool or on the toilet paper. If you underwent a bowel prep for your procedure, you may not have a normal bowel movement for a few days.  Please Note:  You might notice some irritation and congestion in your nose or some drainage.  This is from the oxygen used during your procedure.  There is no need for concern and it should clear up in a day or so.  SYMPTOMS TO REPORT IMMEDIATELY:  Following lower endoscopy (colonoscopy or flexible sigmoidoscopy):  Excessive amounts of blood in the stool  Significant tenderness or worsening of abdominal pains  Swelling of the abdomen that is new, acute  Fever of 100F or higher  For urgent or emergent issues, a gastroenterologist can be reached at any hour by calling [phone]. Do not use MyChart messaging for urgent concerns.    DIET:  We do recommend a small meal at first, but then you may proceed to your regular diet.  Drink plenty of fluids but you should avoid alcoholic beverages for 24 hours.  ACTIVITY:  You should plan to take it easy for the rest of today  and you should NOT DRIVE or use heavy machinery until tomorrow (because of the sedation medicines used during the test).    FOLLOW UP: Our staff will call the number listed on your records 48-72 hours following your procedure to check on you and address any questions or concerns that you may have regarding the information given to you following your procedure. If we do not reach you, we will leave a message.  We will attempt to reach you two times.  During this call, we will ask if you have developed any symptoms of COVID 19. If you develop any symptoms (ie: fever, flu-like symptoms, shortness of breath, cough etc.) before then, please call [phone].  If you test positive for Covid 19 in the 2 weeks post procedure, please call and report this information to [place].    If any biopsies were taken you will be contacted by phone or by letter within the next 1-3 weeks.  Please call us at [phone] if you have not heard about the biopsies in 3 weeks.    SIGNATURES/CONFIDENTIALITY: You and/or your care partner have signed paperwork which will be entered into your electronic medical record.  These signatures attest to the fact that that the information above on your After Visit Summary has been reviewed and is understood.  Full responsibility of the confidentiality of this discharge information lies with you and/or your care-partner.

## 2021-06-27 NOTE — Progress Notes (Signed)
GASTROENTEROLOGY PROCEDURE H&P NOTE   Primary Care Physician: Ria Bush, MD    Reason for Procedure:   Colon cancer screening  Plan:    Colonoscopy  Patient is appropriate for endoscopic procedure(s) in the ambulatory ([place]) setting.  The nature of the procedure, as well as the risks, benefits, and alternatives were carefully and thoroughly reviewed with the patient. Ample time for discussion and questions allowed. The patient understood, was satisfied, and agreed to proceed.     HPI: [name] [name] is a 71 y.o. female who presents for colonoscopy for colon cancer screening. Last colonoscopy was 10 years ago with one hyperplastic polyp. Denies blood in stools, changes in bowel habits, weight loss. Denies fam hx of colon cancer.  Past Medical History:  Diagnosis Date   Acquired deformity of right foot    Back pain    CAD (coronary artery disease) primary cardioloigst-  dr Stanford Breed   hx NSTEMI 10-24-2001  s/p  cardiac cath w/ PCI and DES x1 to first diagonal   Chronic constipation    Constipation    COVID-19 06/02/2020   Depression    Fatty liver 04/26/2015   GERD (gastroesophageal reflux disease)    History of abnormal cervical Pap smear    ACUS 2012   History of exercise stress test 05-25-2014  dr Stanford Breed   borderline (indeterminate) with non-specific ST changes (mild ST depression in leads II,III, aVF, & V6, did not meet criteria for ischemia) ;  pt had low intolerence, no cp, normal heartrate and bp response, no arrhythmias   History of goiter    as child s/p  removal ,  per pt benign , neck area  (not thyroid)   History of heart attack    History of non-ST elevation myocardial infarction (NSTEMI) 10/24/2001   s/p  PCI and DES to first diagonal   Hyperlipidemia    Hypertension    Joint pain    OSA on CPAP pulmoloigst-  dr young   per study 11-07-2004  severe osa   Right foot pain    S/P drug eluting coronary stent placement 10/27/2001   x1 to first  diagonal    Seasonal and perennial allergic rhinitis    Sleep apnea    SOB (shortness of breath)    Type 2 diabetes mellitus ([place])    Vitamin D deficiency    Wears dentures    upper and lower partial denture   Wears glasses     Past Surgical History:  Procedure Laterality Date   BUNIONECTOMY Left 2012   BUNIONECTOMY Right 04/19/2017   Procedure: RIGHT BUNIONECTOMY;  Surgeon: Rosemary Holms, DPM;  Location: [place];  Service: Podiatry;  Laterality: Right;   CARDIAC CATHETERIZATION  09-11-2002   dr Lia Foyer   well-preserved LVF; continued patency previouly placed stent in diagonal branch;  mild luminal irregularities    CARDIOVASCULAR STRESS TEST  05-16-2011   dr Stanford Breed   normal nuclear study w/ no evidence ischemia/  normal LV function and wall motion , ef 77%   CORONARY ANGIOPLASTY WITH STENT PLACEMENT  10-27-2001   dr Lia Foyer   high-grade stenosis first diagonal post PCI and DES (TAXUS);  mild luminal irregularity throughout LAD, RCA, and very mild CFx;  preserved LVF   goiter removal  1968   per pt benign tumor removed from neck beside throat   HALLUX VALGUS LAPIDUS Right 04/19/2017   Procedure: [place];  Surgeon: Rosemary Holms, DPM;  Location: [facility];  Service: Podiatry;  Laterality: Right;   HAMMER TOE SURGERY Right 04/19/2017   Procedure: HAMMER TOE CORRECTION RIGHT 2ND;  Surgeon: Rosemary Holms, DPM;  Location: [facility];  Service: Podiatry;  Laterality: Right;   METATARSAL OSTEOTOMY Right 04/19/2017   Procedure: 2ND METATARSAL OSTEOTOMY;  Surgeon: Rosemary Holms, DPM;  Location: [place];  Service: Podiatry;  Laterality: Right;   TONSILLECTOMY AND ADENOIDECTOMY  age 17   TUBAL LIGATION Bilateral 1985    Prior to Admission medications   Medication Sig Start Date End Date Taking? Authorizing Provider  aspirin 81 MG tablet Take 81 mg by mouth daily.   Yes [provider]   blood glucose meter kit and supplies KIT Dispense based on patient and insurance preference. Use up to four times daily as directed. (FOR ICD-9 250.00, 250.01). 02/17/20  Yes Elby Beck, FNP  buPROPion (WELLBUTRIN XL) 150 MG 24 hr tablet Take 3 tablets (450 mg total) by mouth daily. 05/17/21  Yes Cottle, Billey Co., MD  Calcium Carbonate-Vitamin D 600-400 MG-UNIT tablet Take 2 tablets 2 (two) times daily by mouth.    Yes [provider]  carvedilol (COREG) 12.5 MG tablet TAKE 1 AND 1/2 TABLETS(18.75 MG) BY MOUTH TWICE DAILY 01/16/21  Yes Lelon Perla, MD  Cholecalciferol (VITAMIN D3) 3000 units TABS Take 1 tablet by mouth daily. 06/26/17  Yes Debbrah Alar, NP  cloNIDine (CATAPRES) 0.1 MG tablet TAKE 1 TABLET(0.1 MG) BY MOUTH TWICE DAILY 03/03/21  Yes Lelon Perla, MD  fluticasone (FLONASE) 50 MCG/ACT nasal spray Place 2 sprays into both nostrils daily. 05/10/21  Yes Ria Bush, MD  glucose blood (ONETOUCH ULTRA) test strip Use as instructed to check blood sugar once daily. 08/08/20  Yes Ria Bush, MD  hydrALAZINE (APRESOLINE) 100 MG tablet Take 1 tablet (100 mg total) by mouth in the morning and at bedtime. 02/16/21  Yes Lelon Perla, MD  Lancets [facility] DELICA PLUS [id number]) [place] blood sugar once daily 03/08/21  Yes Ria Bush, MD  metFORMIN (GLUCOPHAGE-XR) 750 MG 24 hr tablet TAKE 1 TABLET(750 MG) BY MOUTH TWICE DAILY WITH A MEAL 02/28/21  Yes Ria Bush, MD  Multiple Vitamins-Minerals (MULTIVITAMIN ADULTS 50+ PO) Take 1 tablet every morning by mouth.    Yes [provider]  olmesartan-hydrochlorothiazide (BENICAR HCT) 40-12.5 MG tablet TAKE 1 TABLET BY MOUTH DAILY 02/07/21  Yes Lelon Perla, MD  pantoprazole (PROTONIX) 40 MG tablet TAKE 1 TABLET(40 MG) BY MOUTH EVERY MORNING 02/28/21  Yes Ria Bush, MD  polyethylene glycol (MIRALAX / GLYCOLAX) 17 g packet Take 17 g by mouth daily.   Yes [provider]   Probiotic Product (PROBIOTIC DAILY PO) Take 1 capsule every morning by mouth.    Yes [provider]  rosuvastatin (CRESTOR) 20 MG tablet TAKE 1 TABLET(20 MG) BY MOUTH DAILY 02/16/21  Yes Crenshaw, Denice Bors, MD  albuterol (VENTOLIN HFA) 108 (90 Base) MCG/ACT inhaler Inhale 1-2 puffs into the lungs every 6 (six) hours as needed for wheezing or shortness of breath. 11/08/20   Ria Bush, MD    Current Outpatient Medications  Medication Sig Dispense Refill   aspirin 81 MG tablet Take 81 mg by mouth daily.     blood glucose meter kit and supplies KIT Dispense based on patient and insurance preference. Use up to four times daily as directed. (FOR ICD-9 250.00, 250.01). 1 each 0   buPROPion (WELLBUTRIN XL) 150 MG 24 hr tablet Take 3  tablets (450 mg total) by mouth daily. 270 tablet 3   Calcium Carbonate-Vitamin D 600-400 MG-UNIT tablet Take 2 tablets 2 (two) times daily by mouth.      carvedilol (COREG) 12.5 MG tablet TAKE 1 AND 1/2 TABLETS(18.75 MG) BY MOUTH TWICE DAILY 270 tablet 3   Cholecalciferol (VITAMIN D3) 3000 units TABS Take 1 tablet by mouth daily. 30 tablet    cloNIDine (CATAPRES) 0.1 MG tablet TAKE 1 TABLET(0.1 MG) BY MOUTH TWICE DAILY 180 tablet 2   fluticasone (FLONASE) 50 MCG/ACT nasal spray Place 2 sprays into both nostrils daily. 16 g 3   glucose blood (ONETOUCH ULTRA) test strip Use as instructed to check blood sugar once daily. 100 each 3   hydrALAZINE (APRESOLINE) 100 MG tablet Take 1 tablet (100 mg total) by mouth in the morning and at bedtime. 180 tablet 1   Lancets (ONETOUCH DELICA PLUS [id number]) MISC Check blood sugar once daily 100 each 3   metFORMIN (GLUCOPHAGE-XR) 750 MG 24 hr tablet TAKE 1 TABLET(750 MG) BY MOUTH TWICE DAILY WITH A MEAL 180 tablet 3   Multiple Vitamins-Minerals (MULTIVITAMIN ADULTS 50+ PO) Take 1 tablet every morning by mouth.      olmesartan-hydrochlorothiazide (BENICAR HCT) 40-12.5 MG tablet TAKE 1 TABLET BY MOUTH DAILY 90 tablet 3    pantoprazole (PROTONIX) 40 MG tablet TAKE 1 TABLET(40 MG) BY MOUTH EVERY MORNING 90 tablet 3   polyethylene glycol (MIRALAX / GLYCOLAX) 17 g packet Take 17 g by mouth daily.     Probiotic Product (PROBIOTIC DAILY PO) Take 1 capsule every morning by mouth.      rosuvastatin (CRESTOR) 20 MG tablet TAKE 1 TABLET(20 MG) BY MOUTH DAILY 90 tablet 3   albuterol (VENTOLIN HFA) 108 (90 Base) MCG/ACT inhaler Inhale 1-2 puffs into the lungs every 6 (six) hours as needed for wheezing or shortness of breath. 1 each 0   Current Facility-Administered Medications  Medication Dose Route Frequency Provider Last Rate Last Admin   0.9 %  sodium chloride infusion  500 mL Intravenous Once Sharyn Creamer, MD        Allergies as of 06/27/2021   (No Known Allergies)    Family History  Problem Relation Age of Onset   Esophageal cancer Mother    Cancer Mother        throat, cervical   Diabetes Mother    Stroke Mother    Heart attack Mother    Heart disease Mother    Hypertension Mother    Thyroid disease Mother    Alcoholism Mother    Liver disease Mother    Cancer Father        prostate   Diabetes Sister    Heart attack Sister    Hypertension Sister    Hyperlipidemia Sister    Colon cancer Neg Hx    Breast cancer Neg Hx    Colon polyps Neg Hx    Stomach cancer Neg Hx    Rectal cancer Neg Hx     Social History   Socioeconomic History   Marital status: Married    Spouse name: Jeneen Rinks   Number of children: 3   Years of education: Not on file   Highest education level: Not on file  Occupational History   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    Employer: [employer]   Occupation: Retired  Tobacco Use   Smoking status: Former    Packs/day: 1.50    Years: 48.00  Pack years: 72.00    Types: Cigarettes    Quit date: 09/23/2012    Years since quitting: 8.7   Smokeless tobacco: Never  Vaping Use   Vaping Use: Never used  Substance and Sexual  Activity   Alcohol use: Yes    Alcohol/week: 0.0 standard drinks    Comment: occasional wine   Drug use: No   Sexual activity: Not Currently  Other Topics Concern   Not on file  Social History Narrative   Caffeine Use:  2 cups coffee daily   Regular exercise:  No      Social Determinants of Health   Financial Resource Strain: Not on file  Food Insecurity: Not on file  Transportation Needs: Not on file  Physical Activity: Not on file  Stress: Not on file  Social Connections: Not on file  Intimate Partner Violence: Not on file    Physical Exam: Vital signs in last 24 hours: BP (!) 126/59    Pulse 61    Temp (!) 97.5 F (36.4 C) (Skin)    Ht '5\' 3"'  (1.6 m)    Wt 179 lb (81.2 kg)    SpO2 97%    BMI 31.71 kg/m  GEN: NAD EYE: Sclerae anicteric ENT: MMM CV: Non-tachycardic Pulm: No increased work of breathing GI: Soft, NT/ND NEURO:  Alert & Oriented   Christia Reading, MD [facility]  06/27/2021 11:21 AM

## 2021-06-27 NOTE — Progress Notes (Signed)
Pt's states no medical or surgical changes since previsit or office visit. VS assessed by D.T 

## 2021-06-27 NOTE — Progress Notes (Signed)
A and O x3. Report to RN. Tolerated MAC anesthesia well. 

## 2021-06-29 ENCOUNTER — Encounter (INDEPENDENT_AMBULATORY_CARE_PROVIDER_SITE_OTHER): Payer: Self-pay | Admitting: Physician Assistant

## 2021-06-29 ENCOUNTER — Other Ambulatory Visit: Payer: Self-pay

## 2021-06-29 ENCOUNTER — Ambulatory Visit (INDEPENDENT_AMBULATORY_CARE_PROVIDER_SITE_OTHER): Payer: PPO | Admitting: Physician Assistant

## 2021-06-29 ENCOUNTER — Encounter: Payer: Self-pay | Admitting: Internal Medicine

## 2021-06-29 ENCOUNTER — Telehealth: Payer: Self-pay

## 2021-06-29 VITALS — BP 148/72 | HR 61 | Temp 97.9°F | Ht 63.0 in | Wt 178.0 lb

## 2021-06-29 DIAGNOSIS — E669 Obesity, unspecified: Secondary | ICD-10-CM

## 2021-06-29 DIAGNOSIS — Z6831 Body mass index (BMI) 31.0-31.9, adult: Secondary | ICD-10-CM

## 2021-06-29 DIAGNOSIS — E559 Vitamin D deficiency, unspecified: Secondary | ICD-10-CM | POA: Diagnosis not present

## 2021-06-29 DIAGNOSIS — E785 Hyperlipidemia, unspecified: Secondary | ICD-10-CM

## 2021-06-29 DIAGNOSIS — E1169 Type 2 diabetes mellitus with other specified complication: Secondary | ICD-10-CM | POA: Diagnosis not present

## 2021-06-29 DIAGNOSIS — E118 Type 2 diabetes mellitus with unspecified complications: Secondary | ICD-10-CM

## 2021-06-29 NOTE — Telephone Encounter (Signed)
Attempted f/u call. No answer, left VM. 

## 2021-06-29 NOTE — Progress Notes (Signed)
Chief Complaint:   OBESITY [name] [name] is here to discuss her progress with her obesity treatment plan along with follow-up of her obesity related diagnoses. [name] [name] is on the Category 2 Plan and states she is following her eating plan approximately 50% of the time. [name] [name] states she is doing 0 minutes 0 times per week.  Today's visit was #: 8 Starting weight: 177 lbs Starting date: 02/02/2021 Today's weight: 178 lbs Today's date: 06/29/2021 Total lbs lost to date: 0 Total lbs lost since last in-office visit: 1 lb  Interim History: [name] [name] had a colonoscopy this week and is currently awaiting results. She states that she is motivated today to restart the plan and feel better, as she feels tired most days.   Subjective:   1. Controlled type 2 diabetes mellitus with complication, without long-term current use of insulin ([name] [name]) [name] [name]'s blood sugar is average. Her last A1C was 6.2. She is currently taking Metformin every night.   2. Hyperlipidemia associated with type 2 diabetes mellitus ([name] [name]) [name] [name] is currently on Crestor and tolerating it well.   3. Vitamin D deficiency [name] [name]'s last Vitamin D was 62.4. She is on 3,000 units of Vitamin D daily.   Assessment/Plan:   1. Controlled type 2 diabetes mellitus with complication, without long-term current use of insulin ([name] [name]) We will check labs today. [name] [name] will continue with meals and weight loss. Good blood sugar control is important to decrease the likelihood of diabetic complications such as nephropathy, neuropathy, limb loss, blindness, coronary artery disease, and death. Intensive lifestyle modification including diet, exercise and weight loss are the first line of treatment for diabetes.   - Comprehensive metabolic panel - Hemoglobin A1c - Insulin, random  2. Hyperlipidemia associated with type 2 diabetes mellitus ([name] [name]) Cardiovascular risk and specific lipid/LDL goals reviewed.  We will check labs today. [name] [name] will continue with  medications. We discussed several lifestyle modifications today and [name] [name] will continue to work on diet, exercise and weight loss efforts. Orders and follow up as documented in patient record.   Counseling Intensive lifestyle modifications are the first line treatment for this issue. Dietary changes: Increase soluble fiber. Decrease simple carbohydrates. Exercise changes: Moderate to vigorous-intensity aerobic activity 150 minutes per week if tolerated. Lipid-lowering medications: see documented in medical record. - Lipid panel  3. Vitamin D deficiency Low Vitamin D level contributes to fatigue and are associated with obesity, breast, and colon cancer. We will check labs today. [name] [name] agrees to continue to take prescription Vitamin D 3,000 IU daily and she will follow-up for routine testing of Vitamin D, at least 2-3 times per year to avoid over-replacement.  - VITAMIN D 25 Hydroxy (Vit-D Deficiency, Fractures)  4. Obesity with current BMI 31.6 [name] [name] is currently in the action stage of change. As such, her goal is to continue with weight loss efforts. She has agreed to the Category 2 Plan.   Exercise goals: No exercise has been prescribed at this time.  Behavioral modification strategies: meal planning and cooking strategies and planning for success.  [name] [name] has agreed to follow-up with our clinic in 2 weeks. She was informed of the importance of frequent follow-up visits to maximize her success with intensive lifestyle modifications for her multiple health conditions.   [name] [name] was informed we would discuss her lab results at her next visit unless there is a critical issue that needs to be addressed sooner. [name] [name] agreed to keep her next visit at the agreed upon time to discuss these results.  Objective:   Blood pressure (!) 148/72, pulse 61, temperature 97.9 F (36.6 C), height 5\' 3"  (1.6 m), weight 178 lb (80.7 kg), SpO2 98 %. Body mass index is 31.53 kg/m.  General: Cooperative, alert,  well developed, in no acute distress. HEENT: Conjunctivae and lids unremarkable. Cardiovascular: Regular rhythm.  Lungs: Normal work of breathing. Neurologic: No focal deficits.   Lab Results  Component Value Date   CREATININE 0.82 02/02/2021   BUN 14 02/02/2021   NA 145 (H) 02/02/2021   K 3.9 02/02/2021   CL 104 02/02/2021   CO2 27 02/02/2021   Lab Results  Component Value Date   ALT 18 02/02/2021   AST 18 02/02/2021   ALKPHOS 64 02/02/2021   BILITOT 0.5 02/02/2021   Lab Results  Component Value Date   HGBA1C 6.2 (H) 02/02/2021   HGBA1C 6.2 (A) 10/21/2020   HGBA1C 5.9 (A) 07/22/2020   HGBA1C 6.6 (H) 02/10/2020   HGBA1C 6.4 (A) 01/28/2019   Lab Results  Component Value Date   INSULIN 26.1 (H) 02/02/2021   INSULIN 62.0 (H) 12/20/2016   INSULIN 66.8 (H) 09/05/2016   Lab Results  Component Value Date   TSH 1.050 02/02/2021   Lab Results  Component Value Date   CHOL 101 02/02/2021   HDL 49 02/02/2021   LDLCALC 31 02/02/2021   LDLDIRECT 77.7 12/27/2009   TRIG 117 02/02/2021   CHOLHDL 2 02/10/2020   Lab Results  Component Value Date   VD25OH 62.4 02/02/2021   VD25OH 80.69 02/10/2020   VD25OH 49.4 12/20/2016   Lab Results  Component Value Date   WBC 4.4 02/10/2020   HGB 12.4 02/10/2020   HCT 37.0 02/10/2020   MCV 84.9 02/10/2020   PLT 157.0 02/10/2020   Lab Results  Component Value Date   FERRITIN 37.3 12/29/2014    Obesity Behavioral Intervention:   Approximately 15 minutes were spent on the discussion below.  ASK: We discussed the diagnosis of obesity with [name] [name] today and [name] [name] agreed to give [place] permission to discuss obesity behavioral modification therapy today.  ASSESS: [name] [name] has the diagnosis of obesity and her BMI today is 31.6. [name] [name] is in the action stage of change.   ADVISE: [name] [name] was educated on the multiple health risks of obesity as well as the benefit of weight loss to improve her health. She was advised of the need for long term  treatment and the importance of lifestyle modifications to improve her current health and to decrease her risk of future health problems.  AGREE: Multiple dietary modification options and treatment options were discussed and [name] [name] agreed to follow the recommendations documented in the above note.  ARRANGE: [name] [name] was educated on the importance of frequent visits to treat obesity as outlined per CMS and USPSTF guidelines and agreed to schedule her next follow up appointment today.  Attestation Statements:   Reviewed by clinician on day of visit: allergies, medications, problem list, medical history, surgical history, family history, social history, and previous encounter notes.  I, Tonye Pearson, am acting as Location manager for [employer], PA-C.  I have reviewed the above documentation for accuracy and completeness, and I agree with the above. Abby Potash, PA-C

## 2021-06-29 NOTE — Telephone Encounter (Signed)
°  Follow up Call-  Call back number 06/27/2021  Post procedure Call Back phone  # [phone]  Permission to leave phone message Yes  Some recent data might be hidden     Patient questions:  Do you have a fever, pain , or abdominal swelling? No. Pain Score  0 *  Have you tolerated food without any problems? Yes.    Have you been able to return to your normal activities? Yes.    Do you have any questions about your discharge instructions: Diet   No. Medications  No. Follow up visit  No.  Do you have questions or concerns about your Care? No.  Actions: * If pain score is 4 or above: No action needed, pain <4.  Have you developed a fever since your procedure? no  2.   Have you had an respiratory symptoms (SOB or cough) since your procedure? no  3.   Have you tested positive for COVID 19 since your procedure no  4.   Have you had any family members/close contacts diagnosed with the COVID 19 since your procedure?  no   If yes to any of these questions please route to Joylene John, RN and Joella Prince, RN

## 2021-06-30 LAB — COMPREHENSIVE METABOLIC PANEL
ALT: 21 IU/L (ref 0–32)
AST: 17 IU/L (ref 0–40)
Albumin/Globulin Ratio: 1.8 (ref 1.2–2.2)
Albumin: 4.4 g/dL (ref 3.7–4.7)
Alkaline Phosphatase: 65 IU/L (ref 44–121)
BUN/Creatinine Ratio: 21 (ref 12–28)
BUN: 16 mg/dL (ref 8–27)
Bilirubin Total: 0.5 mg/dL (ref 0.0–1.2)
CO2: 25 mmol/L (ref 20–29)
Calcium: 9.4 mg/dL (ref 8.7–10.3)
Chloride: 105 mmol/L (ref 96–106)
Creatinine, Ser: 0.76 mg/dL (ref 0.57–1.00)
Globulin, Total: 2.4 g/dL (ref 1.5–4.5)
Glucose: 124 mg/dL — ABNORMAL HIGH (ref 70–99)
Potassium: 3.9 mmol/L (ref 3.5–5.2)
Sodium: 145 mmol/L — ABNORMAL HIGH (ref 134–144)
Total Protein: 6.8 g/dL (ref 6.0–8.5)
eGFR: 84 mL/min/{1.73_m2} (ref >59)

## 2021-06-30 LAB — LIPID PANEL
Chol/HDL Ratio: 2.5 ratio (ref 0.0–4.4)
Cholesterol, Total: 113 mg/dL (ref 100–199)
HDL: 46 mg/dL (ref >39)
LDL Chol Calc (NIH): 43 mg/dL (ref 0–99)
Triglycerides: 141 mg/dL (ref 0–149)
VLDL Cholesterol Cal: 24 mg/dL (ref 5–40)

## 2021-06-30 LAB — HEMOGLOBIN A1C
Est. average glucose Bld gHb Est-mCnc: 148 mg/dL
Hgb A1c MFr Bld: 6.8 % — ABNORMAL HIGH (ref 4.8–5.6)

## 2021-06-30 LAB — INSULIN, RANDOM: INSULIN: 20.5 u[IU]/mL (ref 2.6–24.9)

## 2021-06-30 LAB — VITAMIN D 25 HYDROXY (VIT D DEFICIENCY, FRACTURES): Vit D, 25-Hydroxy: 67.1 ng/mL (ref 30.0–100.0)

## 2021-07-03 ENCOUNTER — Encounter: Payer: Self-pay | Admitting: Family Medicine

## 2021-07-05 ENCOUNTER — Encounter: Payer: Self-pay | Admitting: Nurse Practitioner

## 2021-07-05 ENCOUNTER — Telehealth (INDEPENDENT_AMBULATORY_CARE_PROVIDER_SITE_OTHER): Payer: PPO | Admitting: Nurse Practitioner

## 2021-07-05 ENCOUNTER — Encounter: Payer: Self-pay | Admitting: Family Medicine

## 2021-07-05 VITALS — BP 157/74 | HR 73 | Temp 100.5°F

## 2021-07-05 DIAGNOSIS — U071 COVID-19: Secondary | ICD-10-CM | POA: Diagnosis not present

## 2021-07-05 DIAGNOSIS — G4733 Obstructive sleep apnea (adult) (pediatric): Secondary | ICD-10-CM | POA: Diagnosis not present

## 2021-07-05 MED ORDER — NIRMATRELVIR/RITONAVIR (PAXLOVID)TABLET: 3.0000 | ORAL_TABLET | Freq: Two times a day (BID) | ORAL | 0 refills | Status: AC

## 2021-07-05 NOTE — Assessment & Plan Note (Addendum)
Patient tested positive for COVID-19.  Did discuss antiviral synovator EUA only.  After joint discussion patient elects to pursue antiviral treatment.  Did discuss common side effects with medication chosen.  Did discuss signs and symptoms when to seek urgent or emergent health care.  Also discussed CDC recommendations in regards to quarantine.  Did discuss patient will hold rosuvastatin and Flonase for 10 full days.  Placed this on patient's prescription and AVS along with telling her verbally. Start Paxlovid as soon as possible

## 2021-07-05 NOTE — Progress Notes (Signed)
Patient ID: [name] [name], female    DOB: [birth year], 71 y.o.   MRN: [id number]  Virtual visit completed through [place], a video enabled telemedicine application. Due to national recommendations of social distancing due to COVID-19, a virtual visit is felt to be most appropriate for this patient at this time. Reviewed limitations, risks, security and privacy concerns of performing a virtual visit and the availability of in person appointments. I also reviewed that there may be a patient responsible charge related to this service. The patient agreed to proceed.   Patient location: home Provider location: [place] at [facility], office Persons participating in this virtual visit: patient, provider   If any vitals were documented, they were collected by patient at home unless specified below.    BP (!) 157/74    Pulse 73    Temp (!) 100.5 F (38.1 C) Comment: PER PATIENT THIS MORNING   CC: Covid 19 Subjective:   HPI: [name] [name] is a 71 y.o. female presenting on 07/05/2021 for Covid Positive (On 07/04/21, sx started on 07/04/21-congestion, runny nose, cough, fever, headache, achy, "feel awful," No sore throat. )   Symptoms started on 07/04/2021 Covid tested yesterday and was [employer] x2 vaccines No sick contacts Tylenol OTC has helped some     Relevant past medical, surgical, family and social history reviewed and updated as indicated. Interim medical history since our last visit reviewed. Allergies and medications reviewed and updated. Outpatient Medications Prior to Visit  Medication Sig Dispense Refill   albuterol (VENTOLIN HFA) 108 (90 Base) MCG/ACT inhaler Inhale 1-2 puffs into the lungs every 6 (six) hours as needed for wheezing or shortness of breath. 1 each 0   aspirin 81 MG tablet Take 81 mg by mouth daily.     blood glucose meter kit and supplies KIT Dispense based on patient and insurance preference. Use up to four times daily as directed. (FOR ICD-9  250.00, 250.01). 1 each 0   buPROPion (WELLBUTRIN XL) 150 MG 24 hr tablet Take 3 tablets (450 mg total) by mouth daily. 270 tablet 3   Calcium Carbonate-Vitamin D 600-400 MG-UNIT tablet Take 2 tablets 2 (two) times daily by mouth.      carvedilol (COREG) 12.5 MG tablet TAKE 1 AND 1/2 TABLETS(18.75 MG) BY MOUTH TWICE DAILY 270 tablet 3   Cholecalciferol (VITAMIN D3) 3000 units TABS Take 1 tablet by mouth daily. 30 tablet    cloNIDine (CATAPRES) 0.1 MG tablet TAKE 1 TABLET(0.1 MG) BY MOUTH TWICE DAILY 180 tablet 2   fluticasone (FLONASE) 50 MCG/ACT nasal spray Place 2 sprays into both nostrils daily. 16 g 3   glucose blood (ONETOUCH ULTRA) test strip Use as instructed to check blood sugar once daily. 100 each 3   hydrALAZINE (APRESOLINE) 100 MG tablet Take 1 tablet (100 mg total) by mouth in the morning and at bedtime. 180 tablet 1   Lancets (ONETOUCH DELICA PLUS [id number]) MISC Check blood sugar once daily 100 each 3   metFORMIN (GLUCOPHAGE-XR) 750 MG 24 hr tablet TAKE 1 TABLET(750 MG) BY MOUTH TWICE DAILY WITH A MEAL (Patient taking differently: TAKE 1 TABLET(750 MG) BY MOUTH ONCE DAILY AT BEDTIME) 180 tablet 3   Multiple Vitamins-Minerals (MULTIVITAMIN ADULTS 50+ PO) Take 1 tablet every morning by mouth.      olmesartan-hydrochlorothiazide (BENICAR HCT) 40-12.5 MG tablet TAKE 1 TABLET BY MOUTH DAILY 90 tablet 3   pantoprazole (PROTONIX) 40 MG tablet TAKE 1 TABLET(40 MG) BY MOUTH EVERY MORNING  90 tablet 3   polyethylene glycol (MIRALAX / GLYCOLAX) 17 g packet Take 17 g by mouth daily.     Probiotic Product (PROBIOTIC DAILY PO) Take 1 capsule every morning by mouth.      rosuvastatin (CRESTOR) 20 MG tablet TAKE 1 TABLET(20 MG) BY MOUTH DAILY 90 tablet 3   No facility-administered medications prior to visit.     Per HPI unless specifically indicated in ROS section below Review of Systems  Constitutional:  Positive for appetite change, chills, fatigue and fever.  HENT:  Positive for congestion,  ear pain (itchy or full) and postnasal drip. Negative for sinus pressure, sinus pain and sore throat.   Respiratory:  Positive for cough (productive). Negative for shortness of breath.   Cardiovascular:  Negative for chest pain.  Gastrointestinal:  Negative for abdominal pain, diarrhea, nausea and vomiting.  Musculoskeletal:  Positive for arthralgias and myalgias.  Neurological:  Positive for headaches. Negative for dizziness and light-headedness.  Objective:  BP (!) 157/74    Pulse 73    Temp (!) 100.5 F (38.1 C) Comment: PER PATIENT THIS MORNING  Wt Readings from Last 3 Encounters:  06/29/21 178 lb (80.7 kg)  06/27/21 179 lb (81.2 kg)  06/14/21 179 lb (81.2 kg)       Physical exam: Gen: alert, NAD, not ill appearing Pulm: speaks in complete sentences without increased work of breathing Psych: normal mood, normal thought content      Results for orders placed or performed in visit on 06/29/21  Comprehensive metabolic panel  Result Value Ref Range   Glucose 124 (H) 70 - 99 mg/dL   BUN 16 8 - 27 mg/dL   Creatinine, Ser 0.76 0.57 - 1.00 mg/dL   eGFR 84 >59 mL/min/1.73   BUN/Creatinine Ratio 21 12 - 28   Sodium 145 (H) 134 - 144 mmol/L   Potassium 3.9 3.5 - 5.2 mmol/L   Chloride 105 96 - 106 mmol/L   CO2 25 20 - 29 mmol/L   Calcium 9.4 8.7 - 10.3 mg/dL   Total Protein 6.8 6.0 - 8.5 g/dL   Albumin 4.4 3.7 - 4.7 g/dL   Globulin, Total 2.4 1.5 - 4.5 g/dL   Albumin/Globulin Ratio 1.8 1.2 - 2.2   Bilirubin Total 0.5 0.0 - 1.2 mg/dL   Alkaline Phosphatase 65 44 - 121 IU/L   AST 17 0 - 40 IU/L   ALT 21 0 - 32 IU/L  Hemoglobin A1c  Result Value Ref Range   Hgb A1c MFr Bld 6.8 (H) 4.8 - 5.6 %   Est. average glucose Bld gHb Est-mCnc 148 mg/dL  Insulin, random  Result Value Ref Range   INSULIN 20.5 2.6 - 24.9 uIU/mL  Lipid panel  Result Value Ref Range   Cholesterol, Total 113 100 - 199 mg/dL   Triglycerides 141 0 - 149 mg/dL   HDL 46 >39 mg/dL   VLDL Cholesterol Cal 24 5 -  40 mg/dL   LDL Chol Calc (NIH) 43 0 - 99 mg/dL   Chol/HDL Ratio 2.5 0.0 - 4.4 ratio  VITAMIN D 25 Hydroxy (Vit-D Deficiency, Fractures)  Result Value Ref Range   Vit D, 25-Hydroxy 67.1 30.0 - 100.0 ng/mL   Assessment & Plan:   Problem List Items Addressed This Visit       Other   COVID-19 - Primary    Patient tested positive for COVID-19.  Did discuss antiviral synovator EUA only.  After joint discussion patient elects to pursue antiviral treatment.  Did discuss common side effects with medication chosen.  Did discuss signs and symptoms when to seek urgent or emergent health care.  Also discussed CDC recommendations in regards to quarantine.  Did discuss patient will hold rosuvastatin and Flonase for 10 full days.  Placed this on patient's prescription and AVS along with telling her verbally. Start Paxlovid as soon as possible      Relevant Medications   nirmatrelvir/ritonavir EUA (PAXLOVID) 20 x 150 MG & 10 x 100MG TABS     Meds ordered this encounter  Medications   nirmatrelvir/ritonavir EUA (PAXLOVID) 20 x 150 MG & 10 x 100MG TABS    Sig: Take 3 tablets by mouth 2 (two) times daily for 5 days. (Take nirmatrelvir 150 mg two tablets twice daily for 5 days and ritonavir 100 mg one tablet twice daily for 5 days) Patient GFR is 64.9 HOLD your Crestor (Rosuvastatin) and Flonase (Fluticasone) for 10 full days    Dispense:  30 tablet    Refill:  0    Order Specific Question:   Supervising Provider    Answer:   Loura Pardon A [[id number]]     I discussed the assessment and treatment plan with the patient. The patient was provided an opportunity to ask questions and all were answered. The patient agreed with the plan and demonstrated an understanding of the instructions. The patient was advised to call back or seek an in-person evaluation if the symptoms worsen or if the condition fails to improve as anticipated.  Follow up plan: No follow-ups on file.  Romilda Garret, NP

## 2021-07-05 NOTE — Patient Instructions (Signed)
HOLD your Crestor (Rosuvastatin) and Flonase (fluticasone) for 10 full days

## 2021-07-07 ENCOUNTER — Encounter: Payer: Self-pay | Admitting: Family Medicine

## 2021-07-12 ENCOUNTER — Telehealth: Payer: Self-pay

## 2021-07-12 NOTE — Progress Notes (Signed)
Chronic Care Management Pharmacy Assistant   Name: [name] [name]  MRN: [id number] DOB: 1950-06-26  Reason for Encounter: CCM (Initial Questions)   Recent office visits:  02/28/2021 - Ria Bush, MD - Patient presented for Annual Wellness Visit. Orders: D-dimer. Referral to Gastroenterology. Immunizations given: [employer] (high dose 65+).   Recent consult visits:  07/05/2021 - Karl Ito, NP - Pain Medicine - Video Visit - Patient presented for Covid-19. Start: nirmatrelvir/ritonavir EUA (PAXLOVID) 20 x 150 MG & 10 x 100MG TABS. 06/29/2021 - Abby Potash, PA - Weight Management - Patient presented for diabetes. Labs: CMP, A1c, Insulin, Lipid panel and Vit. D.  06/27/2021 - [facility] -Endoscopy center - Patient presented for Colonoscopy.  06/14/2021 Abby Potash, PA - Weight Management - Patient presented for diabetes. Stop: alum & mag hydroxide-simeth (MAALOX/MYLANTA) 200-200-20 MG/5ML suspension 10 mL.Marland Kitchen 06/13/2021 - Wynonia Lawman, RN - Gastroenterology - Telephone visit for prep for Colonoscopy.  05/17/2021 - Lynder Parents, MD - Scissors - No information.  04/17/2021 - Abby Potash, PA - Weight Management - Patient presented for diabetes. Stop as completed course: Peppermint Oil (IBGARD) 90 MG CPCR.  03/29/2021 - [employer] [facility] - Patient presented for routine gynecological exam. Procedures: Breast exam and PAP smear.  03/22/2021 - Abby Potash, PA - Weight Management - Patient presented for hypertension. No medication changes.  03/03/2021 - Abby Potash, PA - Weight Management - Patient presented for diabetes. No medication changes.  02/16/2021 - Jearld Lesch, DO - Bariatrics - Patient presented for diabetes. No medication changes.  02/07/2021 - Arelia Sneddon, Optometry - Patient presented for type 2 diabetes. Procedure: eye exam and PR Refraction.  02/02/2021 - Jearld Lesch, DO - Bariatrics - Patient presented for fatigue. Labs: CMP,  A1c, Insulin, Lipid, T3, T4, TSH, Vit D and EKG. Stop: nystatin (MYCOSTATIN) 100000 UNIT/ML suspension.  Hospital visits:  None in previous 6 months  Medications: Outpatient Encounter Medications as of 07/12/2021  Medication Sig   albuterol (VENTOLIN HFA) 108 (90 Base) MCG/ACT inhaler Inhale 1-2 puffs into the lungs every 6 (six) hours as needed for wheezing or shortness of breath.   aspirin 81 MG tablet Take 81 mg by mouth daily.   blood glucose meter kit and supplies KIT Dispense based on patient and insurance preference. Use up to four times daily as directed. (FOR ICD-9 250.00, 250.01).   buPROPion (WELLBUTRIN XL) 150 MG 24 hr tablet Take 3 tablets (450 mg total) by mouth daily.   Calcium Carbonate-Vitamin D 600-400 MG-UNIT tablet Take 2 tablets 2 (two) times daily by mouth.    carvedilol (COREG) 12.5 MG tablet TAKE 1 AND 1/2 TABLETS(18.75 MG) BY MOUTH TWICE DAILY   Cholecalciferol (VITAMIN D3) 3000 units TABS Take 1 tablet by mouth daily.   cloNIDine (CATAPRES) 0.1 MG tablet TAKE 1 TABLET(0.1 MG) BY MOUTH TWICE DAILY   fluticasone (FLONASE) 50 MCG/ACT nasal spray Place 2 sprays into both nostrils daily.   glucose blood (ONETOUCH ULTRA) test strip Use as instructed to check blood sugar once daily.   hydrALAZINE (APRESOLINE) 100 MG tablet Take 1 tablet (100 mg total) by mouth in the morning and at bedtime.   Lancets (ONETOUCH DELICA PLUS [id number]) MISC Check blood sugar once daily   metFORMIN (GLUCOPHAGE-XR) 750 MG 24 hr tablet TAKE 1 TABLET(750 MG) BY MOUTH TWICE DAILY WITH A MEAL (Patient taking differently: TAKE 1 TABLET(750 MG) BY MOUTH ONCE DAILY AT BEDTIME)   Multiple Vitamins-Minerals (MULTIVITAMIN ADULTS 50+ PO)  Take 1 tablet every morning by mouth.    olmesartan-hydrochlorothiazide (BENICAR HCT) 40-12.5 MG tablet TAKE 1 TABLET BY MOUTH DAILY   pantoprazole (PROTONIX) 40 MG tablet TAKE 1 TABLET(40 MG) BY MOUTH EVERY MORNING   polyethylene glycol (MIRALAX / GLYCOLAX) 17 g packet Take  17 g by mouth daily.   Probiotic Product (PROBIOTIC DAILY PO) Take 1 capsule every morning by mouth.    rosuvastatin (CRESTOR) 20 MG tablet TAKE 1 TABLET(20 MG) BY MOUTH DAILY   No facility-administered encounter medications on file as of 07/12/2021.   Lab Results  Component Value Date/Time   HGBA1C 6.8 (H) 06/29/2021 11:03 AM   HGBA1C 6.2 (H) 02/02/2021 09:14 AM   MICROALBUR 1.3 02/10/2020 10:34 AM   MICROALBUR 3.6 (H) 11/19/2018 09:29 AM    BP Readings from Last 3 Encounters:  07/05/21 (!) 157/74  06/29/21 (!) 148/72  06/27/21 118/64   Unsuccessful attempt to reach patient. Left patient message to have all medications, supplements and any blood glucose and blood pressure readings available for review at appointment.   Star Rating Drugs:  Medication:   Last Fill: Day Supply Metformin 750 mg  12/09/2020 90   Rosuvastatin 20 mg  05/22/2021 90   Olmesartan-HCTZ 12.5 mg 05/08/2021 90  Verified fill dates with [facility]  Care Gaps: Annual wellness visit in last year? Yes 02/28/2021 Most Recent BP reading: 157/74 on 07/05/2021  If Diabetic: Most recent A1C reading: 6.8 on 06/29/2021 Last eye exam / retinopathy screening: Up to date Last diabetic foot exam: Up to date  Charlene Brooke, CPP notified  Marijean Niemann, [place] [phone]  Time Spent: 63 Minutes

## 2021-07-13 ENCOUNTER — Ambulatory Visit (INDEPENDENT_AMBULATORY_CARE_PROVIDER_SITE_OTHER): Payer: PPO | Admitting: Physician Assistant

## 2021-07-13 ENCOUNTER — Other Ambulatory Visit: Payer: Self-pay

## 2021-07-13 ENCOUNTER — Encounter (INDEPENDENT_AMBULATORY_CARE_PROVIDER_SITE_OTHER): Payer: Self-pay | Admitting: Physician Assistant

## 2021-07-13 VITALS — BP 116/55 | HR 65 | Temp 97.6°F | Ht 63.0 in | Wt 177.0 lb

## 2021-07-13 DIAGNOSIS — E669 Obesity, unspecified: Secondary | ICD-10-CM | POA: Diagnosis not present

## 2021-07-13 DIAGNOSIS — E118 Type 2 diabetes mellitus with unspecified complications: Secondary | ICD-10-CM

## 2021-07-13 DIAGNOSIS — Z6831 Body mass index (BMI) 31.0-31.9, adult: Secondary | ICD-10-CM | POA: Diagnosis not present

## 2021-07-13 DIAGNOSIS — E119 Type 2 diabetes mellitus without complications: Secondary | ICD-10-CM | POA: Diagnosis not present

## 2021-07-13 NOTE — Progress Notes (Signed)
Chief Complaint:   OBESITY Darienne is here to discuss her progress with her obesity treatment plan along with follow-up of her obesity related diagnoses. Rey is on the Category 2 Plan and states she is following her eating plan approximately 25% of the time. Lurlean states she is not currently exercising.  Today's visit was #: 9 Starting weight: 177 lbs Starting date: 02/02/2021 Today's weight: 177 lbs Today's date: 07/13/2021 Total lbs lost to date: 0 Total lbs lost since last in-office visit: 1  Interim History: Pt is recovering from Cassia and her appetite continues to be decreased. She wants to start going to the gym and working out when she feels better.  Subjective:   1. Controlled type 2 diabetes mellitus with complication, without long-term current use of insulin (HCC) Pt's last A1c was 6.8, up from 6.2. She is on Metformin once daily.  Assessment/Plan:   1. Controlled type 2 diabetes mellitus with complication, without long-term current use of insulin (HCC) Good blood sugar control is important to decrease the likelihood of diabetic complications such as nephropathy, neuropathy, limb loss, blindness, coronary artery disease, and death. Intensive lifestyle modification including diet, exercise and weight loss are the first line of treatment for diabetes. Continue with Metformin and plan.  2. Obesity with current BMI 31.36 Demica is currently in the action stage of change. As such, her goal is to continue with weight loss efforts. She has agreed to the Category 2 Plan.   Exercise goals: For substantial health benefits, adults should do at least 150 minutes (2 hours and 30 minutes) a week of moderate-intensity, or 75 minutes (1 hour and 15 minutes) a week of vigorous-intensity aerobic physical activity, or an equivalent combination of moderate- and vigorous-intensity aerobic activity. Aerobic activity should be performed in episodes of at least 10 minutes, and preferably, it should  be spread throughout the week.  Behavioral modification strategies: increasing lean protein intake and decreasing simple carbohydrates.  Oluwatoni has agreed to follow-up with our clinic in 2 weeks. She was informed of the importance of frequent follow-up visits to maximize her success with intensive lifestyle modifications for her multiple health conditions.   Objective:   Blood pressure (!) 116/55, pulse 65, temperature 97.6 F (36.4 C), height 5\' 3"  (1.6 m), weight 177 lb (80.3 kg), SpO2 96 %. Body mass index is 31.35 kg/m.  General: Cooperative, alert, well developed, in no acute distress. HEENT: Conjunctivae and lids unremarkable. Cardiovascular: Regular rhythm.  Lungs: Normal work of breathing. Neurologic: No focal deficits.   Lab Results  Component Value Date   CREATININE 0.76 06/29/2021   BUN 16 06/29/2021   NA 145 (H) 06/29/2021   K 3.9 06/29/2021   CL 105 06/29/2021   CO2 25 06/29/2021   Lab Results  Component Value Date   ALT 21 06/29/2021   AST 17 06/29/2021   ALKPHOS 65 06/29/2021   BILITOT 0.5 06/29/2021   Lab Results  Component Value Date   HGBA1C 6.8 (H) 06/29/2021   HGBA1C 6.2 (H) 02/02/2021   HGBA1C 6.2 (A) 10/21/2020   HGBA1C 5.9 (A) 07/22/2020   HGBA1C 6.6 (H) 02/10/2020   Lab Results  Component Value Date   INSULIN 20.5 06/29/2021   INSULIN 26.1 (H) 02/02/2021   INSULIN 62.0 (H) 12/20/2016   INSULIN 66.8 (H) 09/05/2016   Lab Results  Component Value Date   TSH 1.050 02/02/2021   Lab Results  Component Value Date   CHOL 113 06/29/2021   HDL 46  06/29/2021   LDLCALC 43 06/29/2021   LDLDIRECT 77.7 12/27/2009   TRIG 141 06/29/2021   CHOLHDL 2.5 06/29/2021   Lab Results  Component Value Date   VD25OH 67.1 06/29/2021   VD25OH 62.4 02/02/2021   VD25OH 80.69 02/10/2020   Lab Results  Component Value Date   WBC 4.4 02/10/2020   HGB 12.4 02/10/2020   HCT 37.0 02/10/2020   MCV 84.9 02/10/2020   PLT 157.0 02/10/2020   Lab Results   Component Value Date   FERRITIN 37.3 12/29/2014    Obesity Behavioral Intervention:   Approximately 15 minutes were spent on the discussion below.  ASK: We discussed the diagnosis of obesity with Renezmae today and Tarrah agreed to give [place] permission to discuss obesity behavioral modification therapy today.  ASSESS: Lauraine has the diagnosis of obesity and her BMI today is 31.4. Hudsyn is in the action stage of change.   ADVISE: Miray was educated on the multiple health risks of obesity as well as the benefit of weight loss to improve her health. She was advised of the need for long term treatment and the importance of lifestyle modifications to improve her current health and to decrease her risk of future health problems.  AGREE: Multiple dietary modification options and treatment options were discussed and Inell agreed to follow the recommendations documented in the above note.  ARRANGE: Idamay was educated on the importance of frequent visits to treat obesity as outlined per CMS and USPSTF guidelines and agreed to schedule her next follow up appointment today.  Attestation Statements:   Reviewed by clinician on day of visit: allergies, medications, problem list, medical history, surgical history, family history, social history, and previous encounter notes.  Coral Ceo, CMA, am acting as transcriptionist for [employer], PA-C.  I have reviewed the above documentation for accuracy and completeness, and I agree with the above. Abby Potash, PA-C

## 2021-07-17 ENCOUNTER — Telehealth: Payer: Self-pay | Admitting: Pharmacist

## 2021-07-17 ENCOUNTER — Other Ambulatory Visit: Payer: Self-pay

## 2021-07-17 ENCOUNTER — Ambulatory Visit (INDEPENDENT_AMBULATORY_CARE_PROVIDER_SITE_OTHER): Payer: PPO | Admitting: Pharmacist

## 2021-07-17 DIAGNOSIS — I10 Essential (primary) hypertension: Secondary | ICD-10-CM

## 2021-07-17 DIAGNOSIS — E785 Hyperlipidemia, unspecified: Secondary | ICD-10-CM

## 2021-07-17 DIAGNOSIS — Z6831 Body mass index (BMI) 31.0-31.9, adult: Secondary | ICD-10-CM

## 2021-07-17 DIAGNOSIS — E1169 Type 2 diabetes mellitus with other specified complication: Secondary | ICD-10-CM

## 2021-07-17 DIAGNOSIS — E669 Obesity, unspecified: Secondary | ICD-10-CM

## 2021-07-17 DIAGNOSIS — K219 Gastro-esophageal reflux disease without esophagitis: Secondary | ICD-10-CM

## 2021-07-17 DIAGNOSIS — E118 Type 2 diabetes mellitus with unspecified complications: Secondary | ICD-10-CM

## 2021-07-17 DIAGNOSIS — F331 Major depressive disorder, recurrent, moderate: Secondary | ICD-10-CM

## 2021-07-17 NOTE — Telephone Encounter (Signed)
°  Chronic Care Management   Outreach Note  07/17/2021 Name: [name] [name] MRN: [id number] DOB: [birth year]  Referred by: Ria Bush, MD  Patient had a phone appointment scheduled with clinical pharmacist today.  An unsuccessful telephone outreach was attempted today. The patient was referred to the pharmacist for assistance with medications, care management and care coordination.   Patient will NOT be penalized in any way for missing a CCM appointment. The no-show fee does not apply.  If possible, a message was left to return call to: [phone] or to [facility].  Charlene Brooke, PharmD, BCACP Clinical Pharmacist [facility] at [facility] [phone]

## 2021-07-17 NOTE — Telephone Encounter (Signed)
Patient returned call. See CCM encounter 

## 2021-07-17 NOTE — Progress Notes (Signed)
Chronic Care Management Pharmacy Note  07/18/2021 Name:  [name] [name] MRN:  [id number] DOB:  [birth year]  Summary: -CCM Initial visit: pt endorses compliance with medications -BP at home fluctuating 128/55 - 148/74, generally < 140/90 though -Fasting BG at home somewhat elevated 142-190; A1c recently increased to 6.8% (from 6.2% in Aug 2022) -Pt is enrolled in weight management since Aug 2022 but has not lost any weight. She is interested in GLP-1 weekly injection. -Pt has been taking metformin 750 mg 1 tablet daily for over a year (had GI upset with 2 tablets); she failed adherence metric for metformin last year since Rx still says take 2 tablets/day  Recommendations/Changes made from today's visit: -Recommend Trulicity 6.59 mg weekly (Ozempic still on Producer, television/film/video, Trulicity at lower doses is somewhat easier to obtain) -Updated metformin Rx to reflect once daily dosing (No-print)  Plan: -Benson will call patient 1 month for Trulicity tolerability -Pharmacist follow up televisit scheduled for 3 months -PCP 34-monthf/u 08/30/21   Subjective: [name] Kreiseris an 71y.o. year old female who is a primary patient of [name] MD.  The CCM team was consulted for assistance with disease management and care coordination needs.    Engaged with patient by telephone for initial visit in response to provider referral for pharmacy case management and/or care coordination services.   Consent to Services:  The patient was given the following information about Chronic Care Management services today, agreed to services, and gave verbal consent: 1. CCM service includes personalized support from designated clinical staff supervised by the primary care provider, including individualized plan of care and coordination with other care providers 2. 24/7 contact phone numbers for assistance for urgent and routine care needs. 3. Service will only be billed when office clinical  staff spend 20 minutes or more in a month to coordinate care. 4. Only one practitioner may furnish and bill the service in a calendar month. 5.The patient may stop CCM services at any time (effective at the end of the month) by phone call to the office staff. 6. The patient will be responsible for cost sharing (co-pay) of up to 20% of the service fee (after annual deductible is met). Patient agreed to services and consent obtained.  Patient Care Team: [name] MD as PCP - General (Family Medicine) CStanford BreedBDenice Bors MD as PCP - Cardiology (Cardiology) CStanford BreedBDenice Bors MD as Consulting Physician (Cardiology) PSable Feil MD as Consulting Physician (Gastroenterology) MAzucena Fallen MD as Consulting Physician (Obstetrics and Gynecology) FCharlton Haws RThe [facility] Surgical Careas Pharmacist (Pharmacist)  Recent office visits: 07/05/2021 - JKarl Ito NP - Video Visit - Patient presented for Covid-19. Start: Paxlovid.  02/28/2021 - JRia Bush MD - Patient presented for Annual Wellness Visit. Orders: D-dimer. Referral to Gastroenterology. Immunizations given: FQUALCOMM(high dose 65+).   Recent consult visits: 06/29/2021 - TAbby Potash PA - Weight Management - Patient presented for weight loss. Labs: CMP, A1c, Insulin, Lipid panel and Vit. D.  06/27/2021 - L[facility] -Endoscopy center - Patient presented for Colonoscopy.  06/14/2021 -Abby Potash PA - Weight Management  05/17/2021 [facility] information.  04/17/2021 -Abby Potash PA - Weight Management  03/29/2021 - [place]- Obstetrics and Gynecology - Patient presented for routine gynecological exam. Procedures: Breast exam and PAP smear.  03/22/2021 -Abby Potash PA - Weight Management  03/03/2021 - TAbby Potash PA - Weight Management  02/16/2021 - AGlenard Haring  [place], [place] - Patient presented for diabetes. No medication changes.  02/07/2021 - Arelia Sneddon, Optometry -  Patient presented for type 2 diabetes. Procedure: eye exam and PR Refraction.  02/02/2021 - Jearld Lesch, DO - Bariatrics - Patient presented for fatigue. Labs: CMP, A1c, Insulin, Lipid, T3, T4, TSH, Vit D and EKG.  Hospital visits: None in previous 6 months   Objective:  Lab Results  Component Value Date   CREATININE 0.76 06/29/2021   BUN 16 06/29/2021   GFR 77.58 02/10/2020   EGFR 84 06/29/2021   GFRNONAA 80 06/04/2019   GFRAA 93 06/04/2019   NA 145 (H) 06/29/2021   K 3.9 06/29/2021   CALCIUM 9.4 06/29/2021   CO2 25 06/29/2021   GLUCOSE 124 (H) 06/29/2021    Lab Results  Component Value Date/Time   HGBA1C 6.8 (H) 06/29/2021 11:03 AM   HGBA1C 6.2 (H) 02/02/2021 09:14 AM   GFR 77.58 02/10/2020 10:34 AM   GFR 72.22 10/06/2018 10:58 AM   MICROALBUR 1.3 02/10/2020 10:34 AM   MICROALBUR 3.6 (H) 11/19/2018 09:29 AM    Last diabetic Eye exam:  Lab Results  Component Value Date/Time   HMDIABEYEEXA No Retinopathy 02/07/2021 12:00 AM    Last diabetic Foot exam:  Lab Results  Component Value Date/Time   HMDIABFOOTEX yes 01/11/2009 12:00 AM     Lab Results  Component Value Date   CHOL 113 06/29/2021   HDL 46 06/29/2021   LDLCALC 43 06/29/2021   LDLDIRECT 77.7 12/27/2009   TRIG 141 06/29/2021   CHOLHDL 2.5 06/29/2021    Hepatic Function Latest Ref Rng & Units 06/29/2021 02/02/2021 02/10/2020  Total Protein 6.0 - 8.5 g/dL 6.8 6.5 6.9  Albumin 3.7 - 4.7 g/dL 4.4 4.3 4.4  AST 0 - 40 IU/L _0 ALT 0 - 32 IU/L _1 Alk Phosphatase 44 - 121 IU/L 65 64 51  Total Bilirubin 0.0 - 1.2 mg/dL 0.5 0.5 0.5  Bilirubin, Direct 0.0 - 0.3 mg/dL - - -    Lab Results  Component Value Date/Time   TSH 1.050 02/02/2021 09:14 AM   TSH 0.93 06/09/2018 03:45 PM   FREET4 1.23 02/02/2021 09:14 AM   FREET4 1.17 09/05/2016 12:02 PM    CBC Latest Ref Rng & Units 02/10/2020 06/09/2018 04/19/2017  WBC 4.0 - 10.5 K/uL 4.4 6.3 -  Hemoglobin 12.0 - 15.0 g/dL 12.4 13.1 21.4(HH)   Hematocrit 36.0 - 46.0 % 37.0 38.9 63.0(H)  Platelets 150.0 - 400.0 K/uL 157.0 192.0 -    Lab Results  Component Value Date/Time   VD25OH 67.1 06/29/2021 11:03 AM   VD25OH 62.4 02/02/2021 09:14 AM   VD25OH 80.69 02/10/2020 10:34 AM    Clinical ASCVD: Yes  The ASCVD Risk score (Arnett DK, et al., 2019) failed to calculate for the following reasons:   The valid total cholesterol range is 130 to 320 mg/dL    Depression screen [facility] 2/9 02/28/2021 02/02/2021 07/22/2020  Decreased Interest 0 1 0  Down, Depressed, Hopeless 0 2 0  PHQ - 2 Score 0 3 0  Altered sleeping 2 0 -  Tired, decreased energy 3 3 -  Change in appetite 2 3 -  Feeling bad or failure about yourself  0 0 -  Trouble concentrating 0 3 -  Moving slowly or fidgety/restless 0 1 -  Suicidal thoughts 0 0 -  PHQ-9 Score 7 13 -  Difficult doing work/chores - Not difficult at all -  Some  recent data might be hidden     Social History   Tobacco Use  Smoking Status Former   Packs/day: 1.50   Years: 48.00   Pack years: 72.00   Types: Cigarettes   Quit date: 09/23/2012   Years since quitting: 8.8  Smokeless Tobacco Never   BP Readings from Last 3 Encounters:  07/13/21 (!) 116/55  07/05/21 (!) 157/74  06/29/21 (!) 148/72   Pulse Readings from Last 3 Encounters:  07/13/21 65  07/05/21 73  06/29/21 61   Wt Readings from Last 3 Encounters:  07/13/21 177 lb (80.3 kg)  06/29/21 178 lb (80.7 kg)  06/27/21 179 lb (81.2 kg)   BMI Readings from Last 3 Encounters:  07/13/21 31.35 kg/m  06/29/21 31.53 kg/m  06/27/21 31.71 kg/m    Assessment/Interventions: Review of patient past medical history, allergies, medications, health status, including review of consultants reports, laboratory and other test data, was performed as part of comprehensive evaluation and provision of chronic care management services.   SDOH:  (Social Determinants of Health) assessments and interventions performed: Yes SDOH Interventions     Flowsheet Row Most Recent Value  SDOH Interventions   Food Insecurity Interventions Intervention Not Indicated  Financial Strain Interventions Intervention Not Indicated      SDOH Screenings   Alcohol Screen: Not on file  Depression (PHQ2-9): Medium Risk   PHQ-2 Score: 7  Financial Resource Strain: Low Risk    Difficulty of Paying Living Expenses: Not hard at all  Food Insecurity: No Food Insecurity   Worried About Charity fundraiser in the Last Year: Never true   Ran Out of Food in the Last Year: Never true  Housing: Not on file  Physical Activity: Not on file  Social Connections: Not on file  Stress: Not on file  Tobacco Use: Medium Risk   Smoking Tobacco Use: Former   Smokeless Tobacco Use: Never   Passive Exposure: Not on file  Transportation Needs: Not on file    [place]  No Known Allergies  Medications Reviewed Today     Reviewed by Neomia Dear, CMA (Certified Medical Assistant) on 07/13/21 at [place] List Status: <None>   Medication Order Taking? Sig Documenting Provider Last Dose Status Informant  albuterol (VENTOLIN HFA) 108 (90 Base) MCG/ACT inhaler [id number]  Inhale 1-2 puffs into the lungs every 6 (six) hours as needed for wheezing or shortness of breath. Ria Bush, MD  Active   aspirin 81 MG tablet [id number]  Take 81 mg by mouth daily. [provider]  Active   blood glucose meter kit and supplies KIT [id number]  Dispense based on patient and insurance preference. Use up to four times daily as directed. (FOR ICD-9 250.00, 250.01). Elby Beck, FNP  Active   buPROPion (WELLBUTRIN XL) 150 MG 24 hr tablet [id number]  Take 3 tablets (450 mg total) by mouth daily. Purnell Shoemaker., MD  Active   Calcium Carbonate-Vitamin D 600-400 MG-UNIT tablet [id number]  Take 2 tablets 2 (two) times daily by mouth.  [provider]  Active Self  carvedilol (COREG) 12.5 MG tablet [id number]  TAKE 1 AND 1/2 TABLETS(18.75 MG) BY MOUTH  TWICE DAILY Crenshaw, Denice Bors, MD  Active   Cholecalciferol (VITAMIN D3) 3000 units TABS [id number]  Take 1 tablet by mouth daily. Debbrah Alar, NP  Active   cloNIDine (CATAPRES) 0.1 MG tablet [id number]  TAKE 1 TABLET(0.1 MG) BY MOUTH TWICE DAILY Crenshaw, Denice Bors, MD  Active  fluticasone (FLONASE) 50 MCG/ACT nasal spray [id number]  Place 2 sprays into both nostrils daily. Ria Bush, MD  Active   glucose blood [facility] ULTRA) test strip [id number]  Use as instructed to check blood sugar once daily. Ria Bush, MD  Active   hydrALAZINE (APRESOLINE) 100 MG tablet [id number]  Take 1 tablet (100 mg total) by mouth in the morning and at bedtime. Lelon Perla, MD  Active   Lancets (ONETOUCH DELICA PLUS [id number]) [place] [id number]  Check blood sugar once daily Ria Bush, MD  Active   metFORMIN (GLUCOPHAGE-XR) 750 MG 24 hr tablet [id number]  TAKE 1 TABLET(750 MG) BY MOUTH TWICE DAILY WITH A MEAL  Patient taking differently: TAKE 1 TABLET(750 MG) BY MOUTH ONCE DAILY AT BEDTIME   Ria Bush, MD  Active   Multiple Vitamins-Minerals (MULTIVITAMIN ADULTS 50+ PO) [id number]  Take 1 tablet every morning by mouth.  [provider]  Active Self  olmesartan-hydrochlorothiazide (BENICAR HCT) 40-12.5 MG tablet [id number]  TAKE 1 TABLET BY MOUTH DAILY Lelon Perla, MD  Active   pantoprazole (PROTONIX) 40 MG tablet [id number]  TAKE 1 TABLET(40 MG) BY MOUTH EVERY MORNING Ria Bush, MD  Active   polyethylene glycol (MIRALAX / GLYCOLAX) 17 g packet [id number]  Take 17 g by mouth daily. [provider]  Active   Probiotic Product (PROBIOTIC DAILY PO) [id number]  Take 1 capsule every morning by mouth.  [provider]  Active Self  rosuvastatin (CRESTOR) 20 MG tablet [id number]  TAKE 1 TABLET(20 MG) BY MOUTH DAILY Lelon Perla, MD  Active             Patient Active Problem List   Diagnosis Date Noted   COVID-19 07/05/2021   Medicare annual  wellness visit, subsequent 02/28/2021   Advanced directives, counseling/discussion 02/28/2021   Left leg swelling 02/28/2021   Ex-smoker 11/09/2020   Tongue coating 10/21/2020   Bowel habit changes 07/23/2020   Primary osteoarthritis of both first carpometacarpal joints 05/01/2018   NAFLD (nonalcoholic fatty liver disease) 09/20/2016   Vitamin D deficiency 09/20/2016   Obesity, Class I, BMI 30-34.9 09/05/2016   Left carpal tunnel syndrome 08/18/2015   Intertrigo 05/28/2012   Microalbuminuria 11/29/2011   Internal and external hemorrhoids without complication 03/50/0938   Benign neoplasm of colon 05/30/2011   Health maintenance examination 05/09/2011   Essential hypertension, benign 01/12/2009   Controlled diabetes mellitus type 2 with complications ([place]) 18/29/9371   Hyperlipidemia associated with type 2 diabetes mellitus ([place]) 01/11/2007   MDD (major depressive disorder), recurrent episode, moderate ([place]) 01/11/2007   OBSTRUCTIVE SLEEP APNEA 01/11/2007   Coronary atherosclerosis 01/11/2007   Seasonal and perennial allergic rhinitis 01/11/2007   GERD 01/11/2007    Immunization History  Administered Date(s) Administered   Fluad Quad(high Dose 65+) 02/17/2020, 02/28/2021   Influenza Split 03/05/2012   Influenza, High Dose Seasonal PF 04/25/2015, 02/15/2016, 03/27/2017, 04/09/2018   Influenza,inj,Quad PF,6+ Mos 03/31/2013, 02/03/2014   PFIZER(Purple Top)SARS-COV-2 Vaccination 09/01/2019, 09/22/2019   Pneumococcal Conjugate-13 04/25/2015   Pneumococcal Polysaccharide-23 05/09/2011, 08/22/2016   Tdap 05/09/2011    Conditions to be addressed/monitored:  Hypertension, Hyperlipidemia, Diabetes, Coronary Artery Disease, and GERD  Care Plan : CCM Pharmacy Care Plan  Updates made by Charlton Haws, [place] since 07/18/2021 12:00 AM     Problem: Hypertension, Hyperlipidemia, Diabetes, Coronary Artery Disease, and GERD   Priority: High     Long-Range Goal: Disease mgmt   Start  Date: 07/18/2021  Expected End Date: 07/18/2022  This Visit's Progress:  On track  Priority: High  Note:   Current Barriers:  Unable to independently monitor therapeutic efficacy Suboptimal therapeutic regimen for DM/weight los  Pharmacist Clinical Goal(s):  Patient will achieve adherence to monitoring guidelines and medication adherence to achieve therapeutic efficacy adhere to plan to optimize therapeutic regimen for DM/weight loss as evidenced by report of adherence to recommended medication management changes through collaboration with PharmD and provider.   Interventions: 1:1 collaboration with Ria Bush, MD regarding development and update of comprehensive plan of care as evidenced by provider attestation and co-signature Inter-disciplinary care team collaboration (see longitudinal plan of care) Comprehensive medication review performed; medication list updated in electronic medical record  Hypertension (BP goal <130/80) -Controlled - BP is at goal in clinic, fluctuating above and below goal at home; pt is enrolled in weight management and actively modifying lifestyle to improve diet and exercise -Current home readings: 128/55 , 148/74, 139/62, 122/103, 142/69 -Denies hypotensive/hypertensive symptoms -Hx OSA on CPAP; BP meds per Dr Stanford Breed -Current treatment: Carvedilol 12.5 mg 1.5 tab BID - Appropriate, Effective, Safe, Accessible Hydralazine 100 mg BID- Appropriate, Effective, Safe, Accessible Clonidine 0.1 mg BID- Appropriate, Effective, Safe, Accessible Olmesartan-HCTZ 40-12.5 mg daily- Appropriate, Effective, Safe, Accessible -Medications previously tried: n/a  -Educated on BP goals and benefits of medications for prevention of heart attack, stroke and kidney damage; Importance of home blood pressure monitoring; Proper BP monitoring technique;  -Counseled to monitor BP at home periodically -Recommended to continue current medication  Hyperlipidemia: (LDL goal <  70) -Controlled - LDL is at goal; pt endorses compliance with statin and denies issues -Hx coronary atherosclerosis. Sees cardiology (Dr Stanford Breed) -Current treatment: Rosuvastatin 20 mg daily- Appropriate, Effective, Safe, Accessible Aspirin 81 mg daily- Appropriate, Effective, Safe, Accessible -Medications previously tried: n/a  -Educated on Cholesterol goals; Benefits of statin for ASCVD risk reduction; -Recommended to continue current medication  Diabetes (A1c goal <7%) -Controlled - A1c increased 6.2 > 6.8% recently; continues on metformin once daily due to diarrhea on 2 daily; she is enrolled in weight management and actively modifying lifestyle to lose weight, although she has not been able to lose any weight since starting the program in August 2022 -Hx NAFLD -Current home glucose readings fasting glucose: 142-190 post prandial glucose: not checking -Current medications: Metformin XR 750 mg BID (taking once daily)- Appropriate, Query Effective, Safe, Accessible Testing supplies -Medications previously tried: Victoza (pt unsure why she stopped) -Denies hypoglycemic/hyperglycemic symptoms -Current meal patterns: trying to have more high-protein meals, limit sweets and carbs -Current exercise: limited -Educated on A1c and blood sugar goals; Benefits of weight loss; plate method -Discussed benefits of GLP-1 agonist for weight loss as well as A1c lowering; pt is interested in weekly injection - discussed Ozempic is on national shortage and difficult to obtain right now; lower doses of Trulicity are more available -Update metformin Rx to reflect once daily dosing (pt failed metformin adherence metric last year) -Recommend Trulicity 5.59 mg weekly  Depression/Anxiety (Goal: manage symptoms) -Controlled - per pt report -PHQ9: 7 (02/2021) - mild depression -GAD7: 2 (02/2021) - no/minimal anxiety -Connected with psychiatry (Dr Clovis Pu) for mental health support -Current  treatment: Bupropion XL 150 mg - 3 tab daily -Medications previously tried/failed: Abilify, Vraylar -Educated on Benefits of medication for symptom control -Recommended to continue current medication  GERD (Goal: manage symptoms) -Controlled - per pt report -Current treatment  Pantoprazole 40 mg daily -Medications previously tried: n/a  -Recommended to continue current medication  Health Maintenance -Vaccine gaps: Shingrix, covid booster,  TDAP -Current therapy:  Miralax Probiotic (Align) Calcium - stopped taking Vitamin D Multivitamin Albuterol HFA Fluticasone nasal spray -Patient is satisfied with current therapy and denies issues -Recommended to continue current medication  Patient Goals/Self-Care Activities Patient will:  - take medications as prescribed as evidenced by patient report and record review focus on medication adherence by pill box check glucose daily (varying times), document, and provide at future appointments check blood pressure daily, document, and provide at future appointments engage in dietary modifications by limiting carb/sugar, maximizing protein/vegetables       Medication Assistance: None required.  Patient affirms current coverage meets needs.  Compliance/Adherence/Medication fill history: Care Gaps: NONE  Star-Rating Drugs: Metformin - LF 12/09/20 x 90 ds; PDC 31% (taking 1 tablet instead of 2 daily) Olmesartan-HCTZ - LF 05/08/21 x 90 ds; PDC 93% Rosuvastatin - LF 05/22/21 x 90 ds; [place] 93%  Patient's preferred pharmacy is:  [facility] [place], [place] - [address] [place] [address] Phone: [phone] Fax: [phone]  [facility] [place], [address] AT [address] [place] [address] Phone: [phone] Fax: [phone]  Uses pill box? Yes Pt endorses 100% compliance  We discussed: Current pharmacy is  preferred with insurance plan and patient is satisfied with pharmacy services Patient decided to: Continue current medication management strategy  Care Plan and Follow Up Patient Decision:  Patient agrees to Care Plan and Follow-up.  Plan: Telephone follow up appointment with care management team member scheduled for:  3 months  Charlene Brooke, PharmD, BCACP Clinical Pharmacist [facility] at [facility] [phone]

## 2021-07-18 ENCOUNTER — Telehealth: Payer: Self-pay | Admitting: Pharmacist

## 2021-07-18 MED ORDER — METFORMIN HCL ER 750 MG PO TB24: 750.0000 mg | ORAL_TABLET | Freq: Every day | ORAL | 0 refills | Status: DC

## 2021-07-18 NOTE — Patient Instructions (Addendum)
Visit Information  Phone number for Pharmacist: [phone]  Thank you for meeting with me to discuss your medications! I look forward to working with you to achieve your health care goals. Below is a summary of what we talked about during the visit:   Goals Addressed             This Visit's Progress    Achieve a Healthy Weight-Obesity       Timeframe:  Long-Range Goal Priority:  High Start Date: 07/18/21                            Expected End Date:     07/18/22                  Follow Up Date May 2023   - change to whole grain breads, cereal, pasta - drink 6 to 8 glasses of water each day - eat 3 to 5 servings of fruits and vegetables each day - eat 5 or 6 small meals each day - fill half the plate with nonstarchy vegetables - limit fast food meals to no more than 1 per week - read food labels for fat, fiber, carbohydrates and portion size - set a realistic goal - switch to sugar-free drinks    Why is this important?   When you are ready to manage your weight, have a plan and have set a goal, it is time to take action.  Taking small steps to change how you eat and exercise is a good place to start.    Notes:         Care Plan : CCM Pharmacy Care Plan  Updates made by [name] [name], [name] [name] since 07/18/2021 12:00 AM     Problem: Hypertension, Hyperlipidemia, Diabetes, Coronary Artery Disease, and GERD   Priority: High     Long-Range Goal: Disease mgmt   Start Date: 07/18/2021  Expected End Date: 07/18/2022  This Visit's Progress: On track  Priority: High  Note:   Current Barriers:  Unable to independently monitor therapeutic efficacy Suboptimal therapeutic regimen for DM/weight los  Pharmacist Clinical Goal(s):  Patient will achieve adherence to monitoring guidelines and medication adherence to achieve therapeutic efficacy adhere to plan to optimize therapeutic regimen for DM/weight loss as evidenced by report of adherence to recommended medication management  changes through collaboration with PharmD and provider.   Interventions: 1:1 collaboration with [name] Bush, [name] [name] regarding development and update of comprehensive plan of care as evidenced by provider attestation and co-signature Inter-disciplinary care team collaboration (see longitudinal plan of care) Comprehensive medication review performed; medication list updated in electronic medical record  Hypertension (BP goal <130/80) -Controlled - BP is at goal in clinic, fluctuating above and below goal at home; pt is enrolled in weight management and actively modifying lifestyle to improve diet and exercise -Current home readings: 128/55 , 148/74, 139/62, 122/103, 142/69 -Denies hypotensive/hypertensive symptoms -Hx OSA on CPAP; BP meds per Dr Stanford Breed -Current treatment: Carvedilol 12.5 mg 1.5 tab BID - Appropriate, Effective, Safe, Accessible Hydralazine 100 mg BID- Appropriate, Effective, Safe, Accessible Clonidine 0.1 mg BID- Appropriate, Effective, Safe, Accessible Olmesartan-HCTZ 40-12.5 mg daily- Appropriate, Effective, Safe, Accessible -Medications previously tried: n/a  -Educated on BP goals and benefits of medications for prevention of heart attack, stroke and kidney damage; Importance of home blood pressure monitoring; Proper BP monitoring technique;  -Counseled to monitor BP at home periodically -Recommended to continue current medication  Hyperlipidemia: (LDL  goal < 70) -Controlled - LDL is at goal; pt endorses compliance with statin and denies issues -Hx coronary atherosclerosis. Sees cardiology (Dr Stanford Breed) -Current treatment: Rosuvastatin 20 mg daily- Appropriate, Effective, Safe, Accessible Aspirin 81 mg daily- Appropriate, Effective, Safe, Accessible -Medications previously tried: n/a  -Educated on Cholesterol goals; Benefits of statin for ASCVD risk reduction; -Recommended to continue current medication  Diabetes (A1c goal <7%) -Controlled - A1c increased 6.2 >  6.8% recently; continues on metformin once daily due to diarrhea on 2 daily; she is enrolled in weight management and actively modifying lifestyle to lose weight, although she has not been able to lose any weight since starting the program in August 2022 -Hx NAFLD -Current home glucose readings fasting glucose: 142-190 post prandial glucose: not checking -Current medications: Metformin XR 750 mg BID (taking once daily)- Appropriate, Query Effective, Safe, Accessible Testing supplies -Medications previously tried: Victoza (pt unsure why she stopped) -Denies hypoglycemic/hyperglycemic symptoms -Current meal patterns: trying to have more high-protein meals, limit sweets and carbs -Current exercise: limited -Educated on A1c and blood sugar goals; Benefits of weight loss; plate method -Discussed benefits of GLP-1 agonist for weight loss as well as A1c lowering; pt is interested in weekly injection - discussed Ozempic is on national shortage and difficult to obtain right now; lower doses of Trulicity are more available -Update metformin Rx to reflect once daily dosing (pt failed metformin adherence metric last year) -Recommend Trulicity 8.52 mg weekly  Depression/Anxiety (Goal: manage symptoms) -Controlled - per pt report -PHQ9: 7 (02/2021) - mild depression -GAD7: 2 (02/2021) - no/minimal anxiety -Connected with psychiatry (Dr Clovis Pu) for mental health support -Current treatment: Bupropion XL 150 mg - 3 tab daily -Medications previously tried/failed: Abilify, Vraylar -Educated on Benefits of medication for symptom control -Recommended to continue current medication  GERD (Goal: manage symptoms) -Controlled - per pt report -Current treatment  Pantoprazole 40 mg daily -Medications previously tried: n/a  -Recommended to continue current medication  Health Maintenance -Vaccine gaps: Shingrix, covid booster, TDAP -Current therapy:  Miralax Probiotic (Align) Calcium - stopped  taking Vitamin D Multivitamin Albuterol HFA Fluticasone nasal spray -Patient is satisfied with current therapy and denies issues -Recommended to continue current medication  Patient Goals/Self-Care Activities Patient will:  - take medications as prescribed as evidenced by patient report and record review focus on medication adherence by pill box check glucose daily (varying times), document, and provide at future appointments check blood pressure daily, document, and provide at future appointments engage in dietary modifications by limiting carb/sugar, maximizing protein/vegetables       Ms. Merlino was given information about Chronic Care Management services today including:  CCM service includes personalized support from designated clinical staff supervised by her physician, including individualized plan of care and coordination with other care providers 24/7 contact phone numbers for assistance for urgent and routine care needs. Standard insurance, coinsurance, copays and deductibles apply for chronic care management only during months in which we provide at least 20 minutes of these services. Most insurances cover these services at 100%, however patients may be responsible for any copay, coinsurance and/or deductible if applicable. This service may help you avoid the need for more expensive face-to-face services. Only one practitioner may furnish and bill the service in a calendar month. The patient may stop CCM services at any time (effective at the end of the month) by phone call to the office staff.  Patient agreed to services and verbal consent obtained.   Patient verbalizes understanding of instructions and  care plan provided today and agrees to view in [place]. Active MyChart status confirmed with patient.   Telephone follow up appointment with pharmacy team member scheduled for: 3 months  Charlene Brooke, PharmD, [facility] Clinical Pharmacist [facility] [phone]

## 2021-07-18 NOTE — Telephone Encounter (Addendum)
Last seen 02/2021.  Recommend OV to review trulicity Rx. Will discuss at [place] 08/2021.  Thanks

## 2021-07-18 NOTE — Telephone Encounter (Signed)
Patient reports fasting BG at home somewhat elevated 142-190; A1c recently increased to 6.8% (from 6.2% in Aug 2022). Pt is enrolled in weight management since Aug 2022 but has not lost any weight. She is interested in GLP-1 weekly injection.  -Recommend Trulicity 2.70 mg weekly (Ozempic still on national shortage, Trulicity at lower doses is somewhat easier to obtain).  Forwarding recommendation to PCP - she has upcoming 30-month f/u appt 08/30/21.  Preferred pharmacy: [id number] [address], [place] AT [facility] OF [address] [place] [address] Phone: [phone] Fax: [phone]

## 2021-08-01 ENCOUNTER — Ambulatory Visit (INDEPENDENT_AMBULATORY_CARE_PROVIDER_SITE_OTHER): Payer: PPO | Admitting: Physician Assistant

## 2021-08-01 ENCOUNTER — Other Ambulatory Visit: Payer: Self-pay

## 2021-08-01 ENCOUNTER — Encounter (INDEPENDENT_AMBULATORY_CARE_PROVIDER_SITE_OTHER): Payer: Self-pay | Admitting: Physician Assistant

## 2021-08-01 VITALS — BP 133/74 | HR 65 | Temp 97.9°F | Ht 63.0 in | Wt 179.0 lb

## 2021-08-01 DIAGNOSIS — E669 Obesity, unspecified: Secondary | ICD-10-CM | POA: Diagnosis not present

## 2021-08-01 DIAGNOSIS — I10 Essential (primary) hypertension: Secondary | ICD-10-CM | POA: Diagnosis not present

## 2021-08-01 DIAGNOSIS — Z6831 Body mass index (BMI) 31.0-31.9, adult: Secondary | ICD-10-CM | POA: Diagnosis not present

## 2021-08-01 NOTE — Progress Notes (Signed)
Chief Complaint:   OBESITY [name] [name] is here to discuss her progress with her obesity treatment plan along with follow-up of her obesity related diagnoses. [name] [name] is on the Category 2 Plan and states she is following her eating plan approximately 25% of the time. [name] [name] states she is using treadmill and elliptical 45 minutes 3 times per week.  Today's visit was #: 10 Starting weight: 177 lbs Starting date: 02/02/2021 Today's weight: 179 lbs Today's date: 08/01/2021 Total lbs lost to date: 0 Total lbs lost since last in-office visit: 0  Interim History: [name] [name] reports feeling angry with herself and her inability to follow the plan recently. She ate twice at a seafood place that "soaks their food in butter." She has been eating out more than normal. She is motivated to get back on plan.   Subjective:   1. Essential hypertension, benign BP slightly higher than it has been, but in normal range and pt denies headache or chest pain. Pt has been eating out more often and has retained some fluid today.  Assessment/Plan:   1. Essential hypertension, benign [name] [name] is working on healthy weight loss and exercise to improve blood pressure control. We will watch for signs of hypotension as she continues her lifestyle modifications. Continue with plan and exercise.  2. Obesity with current BMI 31.72 [name] [name] is currently in the action stage of change. As such, her goal is to continue with weight loss efforts. She has agreed to the Category 2 Plan.   Exercise goals:  As is  Behavioral modification strategies: decreasing eating out.  [name] [name] has agreed to follow-up with our clinic in 2 weeks. She was informed of the importance of frequent follow-up visits to maximize her success with intensive lifestyle modifications for her multiple health conditions.   Objective:   Blood pressure 133/74, pulse 65, temperature 97.9 F (36.6 C), height 5\' 3"  (1.6 m), weight 179 lb (81.2 kg), SpO2 96 %. Body mass index  is 31.71 kg/m.  General: Cooperative, alert, well developed, in no acute distress. HEENT: Conjunctivae and lids unremarkable. Cardiovascular: Regular rhythm.  Lungs: Normal work of breathing. Neurologic: No focal deficits.   Lab Results  Component Value Date   CREATININE 0.76 06/29/2021   BUN 16 06/29/2021   NA 145 (H) 06/29/2021   K 3.9 06/29/2021   CL 105 06/29/2021   CO2 25 06/29/2021   Lab Results  Component Value Date   ALT 21 06/29/2021   AST 17 06/29/2021   ALKPHOS 65 06/29/2021   BILITOT 0.5 06/29/2021   Lab Results  Component Value Date   HGBA1C 6.8 (H) 06/29/2021   HGBA1C 6.2 (H) 02/02/2021   HGBA1C 6.2 (A) 10/21/2020   HGBA1C 5.9 (A) 07/22/2020   HGBA1C 6.6 (H) 02/10/2020   Lab Results  Component Value Date   INSULIN 20.5 06/29/2021   INSULIN 26.1 (H) 02/02/2021   INSULIN 62.0 (H) 12/20/2016   INSULIN 66.8 (H) 09/05/2016   Lab Results  Component Value Date   TSH 1.050 02/02/2021   Lab Results  Component Value Date   CHOL 113 06/29/2021   HDL 46 06/29/2021   LDLCALC 43 06/29/2021   LDLDIRECT 77.7 12/27/2009   TRIG 141 06/29/2021   CHOLHDL 2.5 06/29/2021   Lab Results  Component Value Date   VD25OH 67.1 06/29/2021   VD25OH 62.4 02/02/2021   VD25OH 80.69 02/10/2020   Lab Results  Component Value Date   WBC 4.4 02/10/2020   HGB 12.4 02/10/2020   HCT  37.0 02/10/2020   MCV 84.9 02/10/2020   PLT 157.0 02/10/2020   Lab Results  Component Value Date   FERRITIN 37.3 12/29/2014    Obesity Behavioral Intervention:   Approximately 15 minutes were spent on the discussion below.  ASK: We discussed the diagnosis of obesity with [name] [name] today and [name] [name] agreed to give [place] permission to discuss obesity behavioral modification therapy today.  ASSESS: [name] [name] has the diagnosis of obesity and her BMI today is 31.7. [name] [name] is in the action stage of change.   ADVISE: [name] [name] was educated on the multiple health risks of obesity as well as the benefit of  weight loss to improve her health. She was advised of the need for long term treatment and the importance of lifestyle modifications to improve her current health and to decrease her risk of future health problems.  AGREE: Multiple dietary modification options and treatment options were discussed and [name] [name] agreed to follow the recommendations documented in the above note.  ARRANGE: [name] [name] was educated on the importance of frequent visits to treat obesity as outlined per CMS and USPSTF guidelines and agreed to schedule her next follow up appointment today.  Attestation Statements:   Reviewed by clinician on day of visit: allergies, medications, problem list, medical history, surgical history, family history, social history, and previous encounter notes.  Coral Ceo, CMA, am acting as transcriptionist for [employer], PA-C.  I have reviewed the above documentation for accuracy and completeness, and I agree with the above. Abby Potash, PA-C

## 2021-08-08 DIAGNOSIS — I10 Essential (primary) hypertension: Secondary | ICD-10-CM

## 2021-08-08 DIAGNOSIS — E785 Hyperlipidemia, unspecified: Secondary | ICD-10-CM | POA: Diagnosis not present

## 2021-08-08 DIAGNOSIS — F331 Major depressive disorder, recurrent, moderate: Secondary | ICD-10-CM

## 2021-08-08 DIAGNOSIS — E118 Type 2 diabetes mellitus with unspecified complications: Secondary | ICD-10-CM

## 2021-08-08 DIAGNOSIS — E1169 Type 2 diabetes mellitus with other specified complication: Secondary | ICD-10-CM

## 2021-08-16 ENCOUNTER — Other Ambulatory Visit: Payer: Self-pay | Admitting: Cardiology

## 2021-08-24 NOTE — Progress Notes (Signed)
? ? ? ? ?HPI:FU coronary disease. She has had a prior PCI of her diagonal. Her most recent catheterization in April of 2004 showed no obstructive disease, and her ejection fraction was normal. Echocardiogram January 2020 showed normal LV function, small pericardial effusion.  Nuclear study January 2020 showed normal perfusion and ejection fraction 67%.  Abdominal ultrasound August 2020 showed no aneurysm.  Since I last saw her, the patient denies any dyspnea on exertion, orthopnea, PND, pedal edema, palpitations, syncope or chest pain. ? ? ?Current Outpatient Medications  ?Medication Sig Dispense Refill  ? albuterol (VENTOLIN HFA) 108 (90 Base) MCG/ACT inhaler Inhale 1-2 puffs into the lungs every 6 (six) hours as needed for wheezing or shortness of breath. 1 each 0  ? aspirin 81 MG tablet Take 81 mg by mouth daily.    ? blood glucose meter kit and supplies KIT Dispense based on patient and insurance preference. Use up to four times daily as directed. (FOR ICD-9 250.00, 250.01). 1 each 0  ? buPROPion (WELLBUTRIN XL) 150 MG 24 hr tablet Take 3 tablets (450 mg total) by mouth daily. 270 tablet 3  ? Calcium Carbonate-Vitamin D 600-400 MG-UNIT tablet Take 2 tablets 2 (two) times daily by mouth.     ? carvedilol (COREG) 12.5 MG tablet TAKE 1 AND 1/2 TABLETS(18.75 MG) BY MOUTH TWICE DAILY 270 tablet 3  ? Cholecalciferol (VITAMIN D3) 3000 units TABS Take 1 tablet by mouth daily. 30 tablet   ? cloNIDine (CATAPRES) 0.1 MG tablet TAKE 1 TABLET(0.1 MG) BY MOUTH TWICE DAILY 180 tablet 2  ? Dulaglutide (TRULICITY) 1.61 WR/6.0AV SOPN Inject 0.75 mg into the skin once a week. 2 mL 3  ? fluticasone (FLONASE) 50 MCG/ACT nasal spray Place 2 sprays into both nostrils daily. 16 g 3  ? glucose blood (ONETOUCH ULTRA) test strip Use as instructed to check blood sugar once daily. 100 each 3  ? hydrALAZINE (APRESOLINE) 100 MG tablet TAKE 1 TABLET(100 MG) BY MOUTH IN THE MORNING AND AT BEDTIME 180 tablet 1  ? Lancet Devices (ONETOUCH DELICA  PLUS LANCING) MISC Check blood sugar once a day 1 each 0  ? Lancets (ONETOUCH DELICA PLUS [id number]) MISC Check blood sugar once daily 100 each 3  ? metFORMIN (GLUCOPHAGE-XR) 750 MG 24 hr tablet Take 1 tablet (750 mg total) by mouth daily. TAKE 1 TABLET(750 MG) BY MOUTH ONCE DAILY 90 tablet 0  ? Multiple Vitamins-Minerals (MULTIVITAMIN ADULTS 50+ PO) Take 1 tablet every morning by mouth.     ? olmesartan-hydrochlorothiazide (BENICAR HCT) 40-12.5 MG tablet TAKE 1 TABLET BY MOUTH DAILY 90 tablet 3  ? pantoprazole (PROTONIX) 40 MG tablet TAKE 1 TABLET(40 MG) BY MOUTH EVERY MORNING 90 tablet 3  ? polyethylene glycol (MIRALAX / GLYCOLAX) 17 g packet Take 17 g by mouth daily.    ? Probiotic Product (PROBIOTIC DAILY PO) Take 1 capsule every morning by mouth.     ? rosuvastatin (CRESTOR) 20 MG tablet TAKE 1 TABLET(20 MG) BY MOUTH DAILY 90 tablet 3  ? ?No current facility-administered medications for this visit.  ? ? ? ?Past Medical History:  ?Diagnosis Date  ? Acquired deformity of right foot   ? Back pain   ? CAD (coronary artery disease) primary cardioloigst-  dr Stanford Breed  ? hx NSTEMI 10-24-2001  s/p  cardiac cath w/ PCI and DES x1 to first diagonal  ? Chronic constipation   ? Constipation   ? COVID-19 06/02/2020  ? Depression   ? Fatty liver 04/26/2015  ?  GERD (gastroesophageal reflux disease)   ? History of abnormal cervical Pap smear   ? ACUS 2012  ? History of exercise stress test 05-25-2014  dr Stanford Breed  ? borderline (indeterminate) with non-specific ST changes (mild ST depression in leads II,III, aVF, & V6, did not meet criteria for ischemia) ;  pt had low intolerence, no cp, normal heartrate and bp response, no arrhythmias  ? History of goiter   ? as child s/p  removal ,  per pt benign , neck area  (not thyroid)  ? History of heart attack   ? History of non-ST elevation myocardial infarction (NSTEMI) 10/24/2001  ? s/p  PCI and DES to first diagonal  ? Hyperlipidemia   ? Hypertension   ? Joint pain   ? OSA on CPAP  pulmoloigst-  dr young  ? per study 11-07-2004  severe osa  ? Right foot pain   ? S/P drug eluting coronary stent placement 10/27/2001  ? x1 to first diagonal   ? Seasonal and perennial allergic rhinitis   ? Sleep apnea   ? SOB (shortness of breath)   ? Type 2 diabetes mellitus ([place])   ? Vitamin D deficiency   ? Wears dentures   ? upper and lower partial denture  ? Wears glasses   ? ? ?Past Surgical History:  ?Procedure Laterality Date  ? BUNIONECTOMY Left 2012  ? BUNIONECTOMY Right 04/19/2017  ? Procedure: RIGHT BUNIONECTOMY;  Surgeon: Rosemary Holms, DPM;  Location: [place];  Service: Podiatry;  Laterality: Right;  ? CARDIAC CATHETERIZATION  09-11-2002   dr Lia Foyer  ? well-preserved LVF; continued patency previouly placed stent in diagonal branch;  mild luminal irregularities   ? CARDIOVASCULAR STRESS TEST  05-16-2011   dr Stanford Breed  ? normal nuclear study w/ no evidence ischemia/  normal LV function and wall motion , ef 77%  ? COLONOSCOPY  06/2021  ? TAs, diverticulosis, int hem, rpt 3 yrs Lorenso Courier)  ? CORONARY ANGIOPLASTY WITH STENT PLACEMENT  10-27-2001   dr Lia Foyer  ? high-grade stenosis first diagonal post PCI and DES (TAXUS);  mild luminal irregularity throughout LAD, RCA, and very mild CFx;  preserved LVF  ? goiter removal  1968  ? per pt benign tumor removed from neck beside throat  ? HALLUX VALGUS LAPIDUS Right 04/19/2017  ? Procedure: HALLUX VALGUS LAPIDUS FUSION;  Surgeon: Rosemary Holms, DPM;  Location: [place];  Service: Podiatry;  Laterality: Right;  ? HAMMER TOE SURGERY Right 04/19/2017  ? Procedure: HAMMER TOE CORRECTION RIGHT 2ND;  Surgeon: Rosemary Holms, DPM;  Location: [facility];  Service: Podiatry;  Laterality: Right;  ? METATARSAL OSTEOTOMY Right 04/19/2017  ? Procedure: 2ND METATARSAL OSTEOTOMY;  Surgeon: Rosemary Holms, DPM;  Location: [place];  Service: Podiatry;  Laterality: Right;  ? TONSILLECTOMY AND  ADENOIDECTOMY  age 71  ? TUBAL LIGATION Bilateral 1985  ? ? ?Social History  ? ?Socioeconomic History  ? Marital status: Married  ?  Spouse name: Jeneen Rinks  ? Number of children: 3  ? Years of education: Not on file  ? Highest education level: Not on file  ?Occupational History  ? Occupation: Air cabin crew  ?  Comment: credit [employer]  ?  Employer: [employer]  ? Occupation: Retired  ?Tobacco Use  ? Smoking status: Former  ?  Packs/day: 1.50  ?  Years: 48.00  ?  Pack years: 72.00  ?  Types: Cigarettes  ?  Quit  date: 09/23/2012  ?  Years since quitting: 8.9  ? Smokeless tobacco: Never  ?Vaping Use  ? Vaping Use: Never used  ?Substance and Sexual Activity  ? Alcohol use: Yes  ?  Alcohol/week: 0.0 standard drinks  ?  Comment: occasional wine  ? Drug use: No  ? Sexual activity: Not Currently  ?Other Topics Concern  ? Not on file  ?Social History Narrative  ? Caffeine Use:  2 cups coffee daily  ? Regular exercise:  No  ?   ? ?Social Determinants of Health  ? ?Financial Resource Strain: Low Risk   ? Difficulty of Paying Living Expenses: Not hard at all  ?Food Insecurity: No Food Insecurity  ? Worried About Charity fundraiser in the Last Year: Never true  ? Ran Out of Food in the Last Year: Never true  ?Transportation Needs: Not on file  ?Physical Activity: Not on file  ?Stress: Not on file  ?Social Connections: Not on file  ?Intimate Partner Violence: Not on file  ? ? ?Family History  ?Problem Relation Age of Onset  ? Esophageal cancer Mother   ? Cancer Mother   ?     throat, cervical  ? Diabetes Mother   ? Stroke Mother   ? Heart attack Mother   ? Heart disease Mother   ? Hypertension Mother   ? Thyroid disease Mother   ? Alcoholism Mother   ? Liver disease Mother   ? Cancer Father   ?     prostate  ? Diabetes Sister   ? Heart attack Sister   ? Hypertension Sister   ? Hyperlipidemia Sister   ? Colon cancer Neg Hx   ? Breast cancer Neg Hx   ? Colon polyps Neg Hx   ? Stomach cancer Neg Hx   ? Rectal  cancer Neg Hx   ? ? ?ROS: no fevers or chills, productive cough, hemoptysis, dysphasia, odynophagia, melena, hematochezia, dysuria, hematuria, rash, seizure activity, orthopnea, PND, pedal edema, claudication. Rem

## 2021-08-28 ENCOUNTER — Encounter (INDEPENDENT_AMBULATORY_CARE_PROVIDER_SITE_OTHER): Payer: Self-pay

## 2021-08-28 ENCOUNTER — Ambulatory Visit (INDEPENDENT_AMBULATORY_CARE_PROVIDER_SITE_OTHER): Payer: PPO | Admitting: Physician Assistant

## 2021-08-30 ENCOUNTER — Encounter: Payer: Self-pay | Admitting: Family Medicine

## 2021-08-30 ENCOUNTER — Telehealth: Payer: Self-pay

## 2021-08-30 ENCOUNTER — Other Ambulatory Visit: Payer: Self-pay

## 2021-08-30 ENCOUNTER — Ambulatory Visit (INDEPENDENT_AMBULATORY_CARE_PROVIDER_SITE_OTHER): Payer: PPO | Admitting: Family Medicine

## 2021-08-30 VITALS — BP 122/64 | HR 67 | Temp 97.3°F | Ht 63.0 in | Wt 181.0 lb

## 2021-08-30 DIAGNOSIS — E118 Type 2 diabetes mellitus with unspecified complications: Secondary | ICD-10-CM

## 2021-08-30 DIAGNOSIS — E669 Obesity, unspecified: Secondary | ICD-10-CM | POA: Diagnosis not present

## 2021-08-30 DIAGNOSIS — M180 Bilateral primary osteoarthritis of first carpometacarpal joints: Secondary | ICD-10-CM | POA: Diagnosis not present

## 2021-08-30 LAB — POCT GLYCOSYLATED HEMOGLOBIN (HGB A1C): Hemoglobin A1C: 6 % — AB (ref 4.0–5.6)

## 2021-08-30 MED ORDER — ONETOUCH DELICA PLUS LANCET33G MISC: 3 refills | Status: DC

## 2021-08-30 MED ORDER — TRULICITY 0.75 MG/0.5ML ~~LOC~~ SOAJ: 0.7500 mg | SUBCUTANEOUS | 3 refills | Status: DC

## 2021-08-30 NOTE — Assessment & Plan Note (Signed)
Discussed trial topical voltaren gel.  ?

## 2021-08-30 NOTE — Progress Notes (Signed)
? ? Patient ID: [name] [name], female    DOB: 1950/09/04, 71 y.o.   MRN: [id number] ? ?This visit was conducted in person. ? ?BP 122/64   Pulse 67   Temp (!) 97.3 ?F (36.3 ?C) (Temporal)   Ht '5\' 3"'  (1.6 m)   Wt 181 lb (82.1 kg)   SpO2 93%   BMI 32.06 kg/m?   ? ?CC: 6 mo DM f/u visit  ?Subjective:  ? ?HPI: ?[name] [name] is a 71 y.o. female presenting on 08/30/2021 for Diabetes (Here for 6 mo f/u. ) ? ? ?COVID illness 06/2021 - treated with paxlovid with benefit. Symptoms fully resolved.  ? ?Obesity - regularly seeing healthy weight [facility] wellness center. ? ?Notes worsening arthritis initially L thumb also in back, knees, hips. Seeing chiropractor regularly. Also takes PRN tylenol arthritis strength.  ? ?DM - does not regularly check sugars - lost piece of glucometer. Recent 150-160s. Compliant with antihyperglycemic regimen which includes: metformin XR 769m daily. Interested in trulicity. No fmhx thyroid cancer. Denies low sugars or hypoglycemic symptoms. Denies paresthesias, blurry vision. Last diabetic eye exam 01/2021. Glucometer brand: one-touch delica. Last foot exam: 10/2020. DSME: completed remotely in [facility] ?Lab Results  ?Component Value Date  ? HGBA1C 6.0 (A) 08/30/2021  ? ?Diabetic Foot Exam - Simple   ?Simple Foot Form ?Diabetic Foot exam was performed with the following findings: Yes 08/30/2021 10:10 AM  ?Visual Inspection ?No deformities, no ulcerations, no other skin breakdown bilaterally: Yes ?Sensation Testing ?Intact to touch and monofilament testing bilaterally: Yes ?Pulse Check ?Posterior Tibialis and Dorsalis pulse intact bilaterally: Yes ?Comments ?  ? ?Lab Results  ?Component Value Date  ? MICROALBUR 1.3 02/10/2020  ?  ?   ? ?Relevant past medical, surgical, family and social history reviewed and updated as indicated. Interim medical history since our last visit reviewed. ?Allergies and medications reviewed and updated. ?Outpatient Medications Prior to Visit  ?Medication Sig Dispense Refill   ? albuterol (VENTOLIN HFA) 108 (90 Base) MCG/ACT inhaler Inhale 1-2 puffs into the lungs every 6 (six) hours as needed for wheezing or shortness of breath. 1 each 0  ? aspirin 81 MG tablet Take 81 mg by mouth daily.    ? blood glucose meter kit and supplies KIT Dispense based on patient and insurance preference. Use up to four times daily as directed. (FOR ICD-9 250.00, 250.01). 1 each 0  ? buPROPion (WELLBUTRIN XL) 150 MG 24 hr tablet Take 3 tablets (450 mg total) by mouth daily. 270 tablet 3  ? Calcium Carbonate-Vitamin D 600-400 MG-UNIT tablet Take 2 tablets 2 (two) times daily by mouth.     ? carvedilol (COREG) 12.5 MG tablet TAKE 1 AND 1/2 TABLETS(18.75 MG) BY MOUTH TWICE DAILY 270 tablet 3  ? Cholecalciferol (VITAMIN D3) 3000 units TABS Take 1 tablet by mouth daily. 30 tablet   ? cloNIDine (CATAPRES) 0.1 MG tablet TAKE 1 TABLET(0.1 MG) BY MOUTH TWICE DAILY 180 tablet 2  ? fluticasone (FLONASE) 50 MCG/ACT nasal spray Place 2 sprays into both nostrils daily. 16 g 3  ? glucose blood (ONETOUCH ULTRA) test strip Use as instructed to check blood sugar once daily. 100 each 3  ? hydrALAZINE (APRESOLINE) 100 MG tablet TAKE 1 TABLET(100 MG) BY MOUTH IN THE MORNING AND AT BEDTIME 180 tablet 1  ? metFORMIN (GLUCOPHAGE-XR) 750 MG 24 hr tablet Take 1 tablet (750 mg total) by mouth daily. TAKE 1 TABLET(750 MG) BY MOUTH ONCE DAILY 90 tablet 0  ? Multiple  Vitamins-Minerals (MULTIVITAMIN ADULTS 50+ PO) Take 1 tablet every morning by mouth.     ? olmesartan-hydrochlorothiazide (BENICAR HCT) 40-12.5 MG tablet TAKE 1 TABLET BY MOUTH DAILY 90 tablet 3  ? pantoprazole (PROTONIX) 40 MG tablet TAKE 1 TABLET(40 MG) BY MOUTH EVERY MORNING 90 tablet 3  ? polyethylene glycol (MIRALAX / GLYCOLAX) 17 g packet Take 17 g by mouth daily.    ? Probiotic Product (PROBIOTIC DAILY PO) Take 1 capsule every morning by mouth.     ? rosuvastatin (CRESTOR) 20 MG tablet TAKE 1 TABLET(20 MG) BY MOUTH DAILY 90 tablet 3  ? Lancets (ONETOUCH DELICA PLUS  [id number]) MISC Check blood sugar once daily 100 each 3  ? ?No facility-administered medications prior to visit.  ?  ? ?Per HPI unless specifically indicated in ROS section below ?Review of Systems ? ?Objective:  ?BP 122/64   Pulse 67   Temp (!) 97.3 ?F (36.3 ?C) (Temporal)   Ht '5\' 3"'  (1.6 m)   Wt 181 lb (82.1 kg)   SpO2 93%   BMI 32.06 kg/m?   ?Wt Readings from Last 3 Encounters:  ?08/30/21 181 lb (82.1 kg)  ?08/01/21 179 lb (81.2 kg)  ?07/13/21 177 lb (80.3 kg)  ?  ?  ?Physical Exam ?Vitals and nursing note reviewed.  ?Constitutional:   ?   Appearance: Normal appearance. She is not ill-appearing.  ?HENT:  ?   Head:  ?   Comments: Poorly healing nodule above right eyebrow ?Eyes:  ?   Extraocular Movements: Extraocular movements intact.  ?   Conjunctiva/sclera: Conjunctivae normal.  ?   Pupils: Pupils are equal, round, and reactive to light.  ?Cardiovascular:  ?   Rate and Rhythm: Normal rate and regular rhythm.  ?   Pulses: Normal pulses.  ?   Heart sounds: Murmur (3/6 systolic USB) heard.  ?Pulmonary:  ?   Effort: Pulmonary effort is normal. No respiratory distress.  ?   Breath sounds: Normal breath sounds. No wheezing, rhonchi or rales.  ?Abdominal:  ?   General: Bowel sounds are normal. There is no distension.  ?   Palpations: Abdomen is soft. There is no mass.  ?   Tenderness: There is no abdominal tenderness. There is no guarding or rebound.  ?   Hernia: No hernia is present.  ?Musculoskeletal:  ?   Right lower leg: No edema.  ?   Left lower leg: No edema.  ?   Comments: See HPI for foot exam if done  ?Skin: ?   General: Skin is warm and dry.  ?   Findings: No rash.  ?Neurological:  ?   Mental Status: She is alert.  ?Psychiatric:     ?   Mood and Affect: Mood normal.     ?   Behavior: Behavior normal.  ? ?   ?Results for orders placed or performed in visit on 08/30/21  ?POCT glycosylated hemoglobin (Hb A1C)  ?Result Value Ref Range  ? Hemoglobin A1C 6.0 (A) 4.0 - 5.6 %  ? HbA1c POC (<> result, manual  entry)    ? HbA1c, POC (prediabetic range)    ? HbA1c, POC (controlled diabetic range)    ? ? ?Assessment & Plan:  ?This visit occurred during the SARS-CoV-2 public health emergency.  Safety protocols were in place, including screening questions prior to the visit, additional usage of staff PPE, and extensive cleaning of exam room while observing appropriate contact time as indicated for disinfecting solutions.  ? ?Encouraged she reach  out to her dermatologist for further evaluation of R eyebrow nodule r/o BCC.  ?Problem List Items Addressed This Visit   ? ? Controlled diabetes mellitus type 2 with complications ([place]) - Primary  ?  Chronic, stable on metformin XR 723m daily.  ?Recent A1c higher than previously.  ?No fmhx thyroid cancer.  ?She is interested in trulicity. Discussed mechanism of action as well as possible side effects to watch for.  ?Start 0.735mweekly, update with effect after 1 month to consider titrating dose.  ?  ?  ? Relevant Medications  ? Dulaglutide (TRULICITY) 0.5.37GHK/3.2XMOPN  ? Lancets (ONETOUCH DELICA PLUS [id number]  ? Other Relevant Orders  ? POCT glycosylated hemoglobin (Hb A1C) (Completed)  ? Obesity, Class I, BMI 30-34.9  ? Primary osteoarthritis of both first carpometacarpal joints  ?  Discussed trial topical voltaren gel.  ?  ?  ?  ? ?Meds ordered this encounter  ?Medications  ? Dulaglutide (TRULICITY) 0.4.73GUY/3.7QDOPN  ?  Sig: Inject 0.75 mg into the skin once a week.  ?  Dispense:  2 mL  ?  Refill:  3  ? Lancets (ONETOUCH DELICA PLUS [id number]  ?  Sig: Check blood sugar once daily  ?  Dispense:  100 each  ?  Refill:  3  ? ?Orders Placed This Encounter  ?Procedures  ? POCT glycosylated hemoglobin (Hb A1C)  ? ? ?Patient Instructions  ?For joints - try voltaren topical gel over the counter.  ?For diabetes - continue metformin, start trulicity 0.0.37VOeekly injection. If doing well after 1 month, let me know to consider increasing dose.  ?See skin doctor for spot above  right eyebrow  ?Return in 3 months for diabetes follow up visit.  ? ?Follow up plan: ?Return in about 3 months (around 11/30/2021) for follow up visit. ? ?JaRia BushMD   ?

## 2021-08-30 NOTE — Telephone Encounter (Signed)
Dawn, ? ?Who can we communicate with regarding waiving the bill for this charge if insurance doesn't cover as it was an error on our office's part and checked too early?  ? ?Thanks.  ?

## 2021-08-30 NOTE — Telephone Encounter (Signed)
Dr. Darnell Level wants to know if pt could NOT be charged for POC A1c done today.  She just had A1c done on 06/29/21.  So it was too early for another one.  ?

## 2021-08-30 NOTE — Patient Instructions (Addendum)
For joints - try voltaren topical gel over the counter.  ?For diabetes - continue metformin, start trulicity 0.'75mg'$  weekly injection. If doing well after 1 month, let me know to consider increasing dose.  ?See skin doctor for spot above right eyebrow  ?Return in 3 months for diabetes follow up visit.  ?

## 2021-08-30 NOTE — Assessment & Plan Note (Addendum)
Chronic, stable on metformin XR '750mg'$  daily.  ?Recent A1c higher than previously.  ?No fmhx thyroid cancer.  ?She is interested in trulicity. Discussed mechanism of action as well as possible side effects to watch for.  ?Start 0.'75mg'$  weekly, update with effect after 1 month to consider titrating dose.  ?

## 2021-08-31 ENCOUNTER — Encounter: Payer: Self-pay | Admitting: Family Medicine

## 2021-08-31 DIAGNOSIS — E118 Type 2 diabetes mellitus with unspecified complications: Secondary | ICD-10-CM

## 2021-09-01 ENCOUNTER — Encounter: Payer: Self-pay | Admitting: Cardiology

## 2021-09-01 ENCOUNTER — Other Ambulatory Visit: Payer: Self-pay

## 2021-09-01 ENCOUNTER — Ambulatory Visit: Payer: PPO | Admitting: Cardiology

## 2021-09-01 VITALS — BP 114/66 | HR 68 | Ht 63.0 in | Wt 181.0 lb

## 2021-09-01 DIAGNOSIS — I10 Essential (primary) hypertension: Secondary | ICD-10-CM

## 2021-09-01 DIAGNOSIS — E785 Hyperlipidemia, unspecified: Secondary | ICD-10-CM | POA: Diagnosis not present

## 2021-09-01 DIAGNOSIS — I2510 Atherosclerotic heart disease of native coronary artery without angina pectoris: Secondary | ICD-10-CM

## 2021-09-01 MED ORDER — ONETOUCH DELICA PLUS LANCING MISC: 0 refills | Status: DC

## 2021-09-01 NOTE — Telephone Encounter (Signed)
E-scribed OneTouch Delica Plus lancing device.  ?

## 2021-09-01 NOTE — Patient Instructions (Signed)

## 2021-10-09 ENCOUNTER — Telehealth: Payer: Self-pay

## 2021-10-09 NOTE — Progress Notes (Signed)
? ? ?  Chronic Care Management ?Pharmacy Assistant  ? ?Name: [name] [name]  MRN: [id number] DOB: [birth year] ? ?Reason for Encounter: CCM Counsellor) ?  ?Medications: ?Outpatient Encounter Medications as of 10/09/2021  ?Medication Sig  ? albuterol (VENTOLIN HFA) 108 (90 Base) MCG/ACT inhaler Inhale 1-2 puffs into the lungs every 6 (six) hours as needed for wheezing or shortness of breath.  ? aspirin 81 MG tablet Take 81 mg by mouth daily.  ? blood glucose meter kit and supplies KIT Dispense based on patient and insurance preference. Use up to four times daily as directed. (FOR ICD-9 250.00, 250.01).  ? buPROPion (WELLBUTRIN XL) 150 MG 24 hr tablet Take 3 tablets (450 mg total) by mouth daily.  ? Calcium Carbonate-Vitamin D 600-400 MG-UNIT tablet Take 2 tablets 2 (two) times daily by mouth.   ? carvedilol (COREG) 12.5 MG tablet TAKE 1 AND 1/2 TABLETS(18.75 MG) BY MOUTH TWICE DAILY  ? Cholecalciferol (VITAMIN D3) 3000 units TABS Take 1 tablet by mouth daily.  ? cloNIDine (CATAPRES) 0.1 MG tablet TAKE 1 TABLET(0.1 MG) BY MOUTH TWICE DAILY  ? Dulaglutide (TRULICITY) 2.91 BT/6.6MA SOPN Inject 0.75 mg into the skin once a week.  ? fluticasone (FLONASE) 50 MCG/ACT nasal spray Place 2 sprays into both nostrils daily.  ? glucose blood (ONETOUCH ULTRA) test strip Use as instructed to check blood sugar once daily.  ? hydrALAZINE (APRESOLINE) 100 MG tablet TAKE 1 TABLET(100 MG) BY MOUTH IN THE MORNING AND AT BEDTIME  ? Lancet Devices (ONETOUCH DELICA PLUS LANCING) MISC Check blood sugar once a day  ? Lancets (ONETOUCH DELICA PLUS [id number]) MISC Check blood sugar once daily  ? metFORMIN (GLUCOPHAGE-XR) 750 MG 24 hr tablet Take 1 tablet (750 mg total) by mouth daily. TAKE 1 TABLET(750 MG) BY MOUTH ONCE DAILY  ? Multiple Vitamins-Minerals (MULTIVITAMIN ADULTS 50+ PO) Take 1 tablet every morning by mouth.   ? olmesartan-hydrochlorothiazide (BENICAR HCT) 40-12.5 MG tablet TAKE 1 TABLET BY MOUTH DAILY  ? pantoprazole  (PROTONIX) 40 MG tablet TAKE 1 TABLET(40 MG) BY MOUTH EVERY MORNING  ? polyethylene glycol (MIRALAX / GLYCOLAX) 17 g packet Take 17 g by mouth daily.  ? Probiotic Product (PROBIOTIC DAILY PO) Take 1 capsule every morning by mouth.   ? rosuvastatin (CRESTOR) 20 MG tablet TAKE 1 TABLET(20 MG) BY MOUTH DAILY  ? ?No facility-administered encounter medications on file as of 10/09/2021.  ? ?[name] [name] was contacted to remind of upcoming telephone visit with Charlene Brooke on 10/12/2021 at 11:00. Patient was reminded to have any blood glucose and blood pressure readings available for review at appointment.  ? ?Patient confirmed appointment. ? ?Are you having any problems with your medications? No  ? ?Do you have any concerns you like to discuss with the pharmacist? No ? ?Star Rating Drugs: ?Medication:    Last Fill: Day Supply ?Trulicity 1.42 mg   39/53/2023 28 ?Metformin 750 mg   07/29/2021 90    ?Rosuvastatin 20 mg   08/25/2021 90    ?Olmesartan-HCTZ 40-12.5 mg 08/16/2021 90  ? ?Charlene Brooke, CPP notified ? ?Marijean Niemann, RMA ?Clinical Pharmacy Assistant ?[phone] ? ? ? ? ?

## 2021-10-10 ENCOUNTER — Telehealth: Payer: Self-pay

## 2021-10-10 DIAGNOSIS — G4733 Obstructive sleep apnea (adult) (pediatric): Secondary | ICD-10-CM

## 2021-10-10 NOTE — Telephone Encounter (Signed)
Saw Dr Annamaria Boots pulm. ?Rec return to pulm for OSA. Referral placed.  ?

## 2021-10-10 NOTE — Telephone Encounter (Signed)
Patient called requesting order for cpap machine [facility]  Should be for replacement c pap  ?Fax number [phone] ?

## 2021-10-10 NOTE — Telephone Encounter (Signed)
Spoke to patient by telephone and was advised that she has had the CPAP machine for at least 5 years or more. Patient stated that the machine was prescribed by a pulmonologist across from [facility] but she does not remember his name. Patient wants to know if Dr. Danise Mina can order her new equipment. ?

## 2021-10-10 NOTE — Addendum Note (Signed)
Addended by: Ria Bush on: 10/10/2021 06:18 PM ? ? Modules accepted: Orders ? ?

## 2021-10-11 ENCOUNTER — Encounter: Payer: Self-pay | Admitting: Family Medicine

## 2021-10-11 NOTE — Telephone Encounter (Signed)
Pt notified of referral in a response to her mychart message.  ?

## 2021-10-12 ENCOUNTER — Ambulatory Visit: Payer: PPO | Admitting: Pharmacist

## 2021-10-12 DIAGNOSIS — E669 Obesity, unspecified: Secondary | ICD-10-CM

## 2021-10-12 DIAGNOSIS — E785 Hyperlipidemia, unspecified: Secondary | ICD-10-CM

## 2021-10-12 DIAGNOSIS — F331 Major depressive disorder, recurrent, moderate: Secondary | ICD-10-CM

## 2021-10-12 DIAGNOSIS — E66811 Obesity, class 1: Secondary | ICD-10-CM

## 2021-10-12 DIAGNOSIS — E1169 Type 2 diabetes mellitus with other specified complication: Secondary | ICD-10-CM

## 2021-10-12 DIAGNOSIS — I10 Essential (primary) hypertension: Secondary | ICD-10-CM

## 2021-10-12 DIAGNOSIS — E118 Type 2 diabetes mellitus with unspecified complications: Secondary | ICD-10-CM

## 2021-10-12 DIAGNOSIS — K219 Gastro-esophageal reflux disease without esophagitis: Secondary | ICD-10-CM

## 2021-10-12 NOTE — Progress Notes (Signed)
? ?Chronic Care Management ?Pharmacy Note ? ?10/18/2021 ?Name:  [name] [name] MRN:  [id number] DOB:  01/01/1951 ? ?Summary: CCM F/U visit ?-Fasting BG at home 120-150, avg 140s. She is not checking post-prandial levels. A1c recently improved to 6.0% (08/2021) - discussed her BG readings and A1c do not correlate well (A1c of 6.0% correlates with average glucose ~126), there does not appear to be a clear reason for this besides lab error or glucometer error ?-Pt is enrolled in weight management since Aug 2022 but has not lost any weight. ?-Pt reports compliance with weekly Trulicity and denies significant side effects, she has noticed she cannot overeat anymore and appetite is decreased, she may benefit from higher dose for weight loss ? ?Recommendations/Changes made from today's visit: ?-Recommend to increase Trulicity to 1.5 mg at PCP visit next month; may consider d/c metformin as well - pt to discuss with PCP ?-Advised to test post-prandial sugars occasionally (~2 hrs after dinner) ? ?Plan: ?-[facility] will call patient 3 months for DM update ?-Pharmacist follow up televisit scheduled for 6 months ?-PCP 63-monthf/u 12/01/21 ? ? ?Subjective: ?DMilo [name] an 71y.o. year old female who is a primary patient of [name] [name].  The CCM team was consulted for assistance with disease management and care coordination needs.   ? ?Engaged with patient by telephone for follow up visit in response to provider referral for pharmacy case management and/or care coordination services.  ? ?Consent to Services:  ?The patient was given information about Chronic Care Management services, agreed to services, and gave verbal consent prior to initiation of services.  Please see initial visit note for detailed documentation.  ? ?Patient Care Team: ?[name] [name] as PCP - General (Family Medicine) ?CLelon Perla [name] as PCP - Cardiology (Cardiology) ?CLelon Perla [name] as Consulting Physician  (Cardiology) ?PSable Feil [name] as Consulting Physician (Gastroenterology) ?MAzucena Fallen [name] as Consulting Physician (Obstetrics and Gynecology) ?Makinley Muscato, LCleaster Corin R[facility] Pharmacist (Pharmacist) ? ?Recent office visits: ?10/10/21 TE - referred to pulm for CPAP. ?08/30/21 Dr GDanise MinaOV: f/u DM. A1C 6.0%; not checking sugars. Start Trulicity 02.44mg weekly (wt loss/DM). RTC  3 months. ? ?07/05/2021 - JKarl Ito NP - Video Visit - Patient presented for Covid-19. Start: Paxlovid. ? ?02/28/2021 - JRia Bush [name] - Patient presented for Annual Wellness Visit. Orders: D-dimer. Referral to Gastroenterology. Immunizations given: FQUALCOMM(high dose 65+).  ? ?Recent consult visits: ?09/01/21 Dr CStanford Breed(Cardiology): f/u CAD; no changes. ?08/01/21 PA Aguilar (weight mgmt): visit #10. Net weight: +2 lbs ?06/29/2021 - TAbby Potash PA - Weight Management - Patient presented for weight loss. Labs: CMP, A1c, Insulin, Lipid panel and Vit. D.  ?06/27/2021 - [facility] -Endoscopy center - Patient presented for Colonoscopy.  ?06/14/2021 -Abby Potash PA - Weight Management  ?05/17/2021 - CLynder Parents [name]- No information.  ?04/17/2021 -Abby Potash PA - Weight Management  ?03/29/2021 - [facility] - Patient presented for routine gynecological exam. Procedures: Breast exam and PAP smear.  ?03/22/2021 -Abby Potash PA - Weight Management  ?03/03/2021 - TAbby Potash PA - Weight Management  ?02/16/20[place] Patient presented for diabetes. No medication changes.  ?02/07/2021 - [facility]rel[facility] - Patient presented for type 2 diabetes. Procedure: eye exam and PR Refraction.  ?02/02/2021 - AJearld Lesch DO - Bariatrics - Patient presented for fatigue. Labs: CMP, A1c, Insulin,  Lipid, T3, T4, TSH, Vit D and EKG. ? ?Hospital visits: ?None in previous 6 months ? ? ?Objective: ? ?Lab Results  ?Component Value Date  ? CREATININE 0.76  06/29/2021  ? BUN 16 06/29/2021  ? GFR 77.58 02/10/2020  ? EGFR 84 06/29/2021  ? GFRNONAA 80 06/04/2019  ? GFRAA 93 06/04/2019  ? NA 145 (H) 06/29/2021  ? K 3.9 06/29/2021  ? CALCIUM 9.4 06/29/2021  ? CO2 25 06/29/2021  ? GLUCOSE 124 (H) 06/29/2021  ? ? ?Lab Results  ?Component Value Date/Time  ? HGBA1C 6.0 (A) 08/30/2021 10:05 AM  ? HGBA1C 6.8 (H) 06/29/2021 11:03 AM  ? HGBA1C 6.2 (H) 02/02/2021 09:14 AM  ? GFR 77.58 02/10/2020 10:34 AM  ? GFR 72.22 10/06/2018 10:58 AM  ? MICROALBUR 1.3 02/10/2020 10:34 AM  ? MICROALBUR 3.6 (H) 11/19/2018 09:29 AM  ?  ?Last diabetic Eye exam:  ?Lab Results  ?Component Value Date/Time  ? HMDIABEYEEXA No Retinopathy 02/07/2021 12:00 AM  ?  ?Last diabetic Foot exam:  ?Lab Results  ?Component Value Date/Time  ? HMDIABFOOTEX yes 01/11/2009 12:00 AM  ?  ? ?Lab Results  ?Component Value Date  ? CHOL 113 06/29/2021  ? HDL 46 06/29/2021  ? [place] 43 06/29/2021  ? LDLDIRECT 77.7 12/27/2009  ? TRIG 141 06/29/2021  ? CHOLHDL 2.5 06/29/2021  ? ? ? ?  Latest Ref Rng & Units 06/29/2021  ? 11:03 AM 02/02/2021  ?  9:14 AM 02/10/2020  ? 10:34 AM  ?Hepatic Function  ?Total Protein 6.0 - 8.5 g/dL 6.8   6.5   6.9    ?Albumin 3.7 - 4.7 g/dL 4.4   4.3   4.4    ?AST 0 - 40 IU/L '17   18   19    ' ?ALT 0 - 32 IU/L '21   18   24    ' ?Alk Phosphatase 44 - 121 IU/L 65   64   51    ?Total Bilirubin 0.0 - 1.2 mg/dL 0.5   0.5   0.5    ? ? ?Lab Results  ?Component Value Date/Time  ? TSH 1.050 02/02/2021 09:14 AM  ? TSH 0.93 06/09/2018 03:45 PM  ? FREET4 1.23 02/02/2021 09:14 AM  ? FREET4 1.17 09/05/2016 12:02 PM  ? ? ? ?  Latest Ref Rng & Units 02/10/2020  ? 10:34 AM 06/09/2018  ?  3:45 PM 04/19/2017  ? 11:42 AM  ?CBC  ?WBC 4.0 - 10.5 K/uL 4.4   6.3     ?Hemoglobin 12.0 - 15.0 g/dL 12.4   13.1   21.4    ?Hematocrit 36.0 - 46.0 % 37.0   38.9   63.0    ?Platelets 150.0 - 400.0 K/uL 157.0   192.0     ? ? ?Lab Results  ?Component Value Date/Time  ? VD25OH 67.1 06/29/2021 11:03 AM  ? VD25OH 62.4 02/02/2021 09:14 AM  ? VD25OH  80.69 02/10/2020 10:34 AM  ? ? ?Clinical ASCVD: Yes  ?The ASCVD Risk score (Arnett DK, et al., 2019) failed to calculate for the following reasons: ?  The valid total cholesterol range is 130 to 320 mg/dL   ? ? ?  02/28/2021  ? 12:00 PM 02/02/2021  ?  7:54 AM 07/22/2020  ?  3:53 PM  ?Depression screen PHQ 2/9  ?Decreased Interest 0 1 0  ?Down, Depressed, Hopeless 0 2 0  ?PHQ - 2 Score 0 3 0  ?Altered sleeping 2 0   ?Tired, decreased energy 3  3   ?Change in appetite 2 3   ?Feeling bad or failure about yourself  0 0   ?Trouble concentrating 0 3   ?Moving slowly or fidgety/restless 0 1   ?Suicidal thoughts 0 0   ?PHQ-9 Score 7 13   ?Difficult doing work/chores  Not difficult at all   ?  ? ?Social History  ? ?Tobacco Use  ?Smoking Status Former  ? Packs/day: 1.50  ? Years: 48.00  ? Pack years: 72.00  ? Types: Cigarettes  ? Quit date: 09/23/2012  ? Years since quitting: 9.0  ?Smokeless Tobacco Never  ? ?BP Readings from Last 3 Encounters:  ?09/01/21 114/66  ?08/30/21 122/64  ?08/01/21 133/74  ? ?Pulse Readings from Last 3 Encounters:  ?09/01/21 68  ?08/30/21 67  ?08/01/21 65  ? ?Wt Readings from Last 3 Encounters:  ?09/01/21 181 lb (82.1 kg)  ?08/30/21 181 lb (82.1 kg)  ?08/01/21 179 lb (81.2 kg)  ? ?BMI Readings from Last 3 Encounters:  ?09/01/21 32.06 kg/m?  ?08/30/21 32.06 kg/m?  ?08/01/21 31.71 kg/m?  ? ? ?Assessment/Interventions: Review of patient past medical history, allergies, medications, health status, including review of consultants reports, laboratory and other test data, was performed as part of comprehensive evaluation and provision of chronic care management services.  ? ?SDOH:  (Social Determinants of Health) assessments and interventions performed: No - done 07/2021 ? ? ?SDOH Screenings  ? ?Alcohol Screen: Not on file  ?Depression (PHQ2-9): Medium Risk  ? PHQ-2 Score: 7  ?Financial Resource Strain: Low Risk   ? Difficulty of Paying Living Expenses: Not hard at all  ?Food Insecurity: No Food Insecurity  ?  Worried About Charity fundraiser in the Last Year: Never true  ? Ran Out of Food in the Last Year: Never true  ?Housing: Not on file  ?Physical Activity: Not on file  ?Social Connections: Not on file  ?Stress: No

## 2021-10-18 NOTE — Patient Instructions (Addendum)
Visit Information ? ?Phone number for Pharmacist: [phone] ? ? Goals Addressed   ?None ?  ? ? ?Care Plan : [place]  ?Updates made by Charlton Haws, RPH since 10/18/2021 12:00 AM  ?  ? ?Problem: Hypertension, Hyperlipidemia, Diabetes, Coronary Artery Disease, and GERD   ?Priority: High  ?  ? ?Long-Range Goal: Disease mgmt   ?Start Date: 07/18/2021  ?Expected End Date: 07/18/2022  ?This Visit's Progress: On track  ?Recent Progress: On track  ?Priority: High  ?Note:   ?Current Barriers:  ?Unable to independently monitor therapeutic efficacy ?Suboptimal therapeutic regimen for DM/weight los ? ?Pharmacist Clinical Goal(s):  ?Patient will achieve adherence to monitoring guidelines and medication adherence to achieve therapeutic efficacy ?adhere to plan to optimize therapeutic regimen for DM/weight loss as evidenced by report of adherence to recommended medication management changes through collaboration with PharmD and provider.  ? ?Interventions: ?1:1 collaboration with Ria Bush, MD regarding development and update of comprehensive plan of care as evidenced by provider attestation and co-signature ?Inter-disciplinary care team collaboration (see longitudinal plan of care) ?Comprehensive medication review performed; medication list updated in electronic medical record ? ?Hypertension (BP goal <130/80) ?-Controlled - BP is at goal in clinic ?-Current home readings: none available ?-Denies hypotensive/hypertensive symptoms ?-Hx OSA on CPAP; BP meds per Dr Stanford Breed ?-Current treatment: ?Carvedilol 12.5 mg 1.5 tab BID - Appropriate, Effective, Safe, Accessible ?Hydralazine 100 mg BID- Appropriate, Effective, Safe, Accessible ?Clonidine 0.1 mg BID- Appropriate, Effective, Safe, Accessible ?Olmesartan-HCTZ 40-12.5 mg daily- Appropriate, Effective, Safe, Accessible ?-Medications previously tried: n/a  ?-Educated on BP goals and benefits of medications for prevention of heart attack, stroke and  kidney damage; Importance of home blood pressure monitoring; Proper BP monitoring technique;  ?-Counseled to monitor BP at home periodically ?-Recommended to continue current medication ? ?Hyperlipidemia: (LDL goal < 70) ?-Controlled - LDL 43 (06/2021) is at goal; pt endorses compliance with statin and denies issues ?-Hx coronary atherosclerosis. Sees cardiology (Dr Stanford Breed) ?-Current treatment: ?Rosuvastatin 20 mg daily- Appropriate, Effective, Safe, Accessible ?Aspirin 81 mg daily- Appropriate, Effective, Safe, Accessible ?-Medications previously tried: n/a  ?-Educated on Cholesterol goals; Benefits of statin for ASCVD risk reduction; ?-Recommended to continue current medication ? ?Diabetes (A1c goal <7%) ?-Controlled - A1c 6.0% (08/2021) recently; continues on metformin once daily due to diarrhea on 2 daily; she is enrolled in weight management and actively modifying lifestyle to lose weight, although she has not been able to lose any weight since starting the program in August 2022; she endorses compliance with weekly Trulicity and denies side effects, she has noticed reduced appetite/feeling full faster ?-Hx NAFLD ?-Current home glucose readings ?fasting glucose: 121, 148, 143, 143 ?post prandial glucose: not checking ?-Current medications: ?Metformin XR 750 mg daily- Appropriate, Effective, Safe, Accessible ?Trulicity 4.74 mg weekly - Appropriate, Effective, Safe, Accessible ?Testing supplies ?-Medications previously tried: Victoza (pt unsure why she stopped) ?-Current meal patterns: trying to have more high-protein meals, limit sweets and carbs ?-Current exercise: limited ?-Reviewed BG readings - avg fasting in 140s does not correlate well with recent A1c of 6.0% (A1c 6.0 = average BG ~126);  ?-Recommend to increase Trulicity to 1.5 mg - pt to discuss with PCP at upcoming appt 12/01/21 ? ?Depression/Anxiety (Goal: manage symptoms) ?-Controlled - per pt report ?-PHQ9: 7 (02/2021) - mild depression ?-GAD7: 2  (02/2021) - no/minimal anxiety ?-Connected with psychiatry (Dr Clovis Pu) for mental health support ?-Current treatment: ?Bupropion XL 150 mg - 3 tab daily - Appropriate, Effective, Safe, Accessible ?-Medications  previously tried/failed: Abilify, Vraylar ?-Educated on Benefits of medication for symptom control ?-Recommended to continue current medication ? ?GERD (Goal: manage symptoms) ?-Controlled - per pt report ?-Current treatment  ?Pantoprazole 40 mg daily - Appropriate, Effective, Safe, Accessible ?-Medications previously tried: n/a  ?-Recommended to continue current medication ? ?Health Maintenance ?-Vaccine gaps: Shingrix, covid booster, TDAP ? ?Patient Goals/Self-Care Activities ?Patient will:  ?- take medications as prescribed as evidenced by patient report and record review ?focus on medication adherence by pill box ?check glucose daily (varying times), document, and provide at future appointments ?check blood pressure daily, document, and provide at future appointments ?engage in dietary modifications by limiting carb/sugar, maximizing protein/vegetables ? ?  ?  ? ?Patient verbalizes understanding of instructions and care plan provided today and agrees to view in [place]. Active MyChart status confirmed with patient.   ?Telephone follow up appointment with pharmacy team member scheduled for: 3 months ? ?Charlene Brooke, PharmD, BCACP ?Clinical Pharmacist ?[facility] ?[phone] ?  ?

## 2021-10-19 ENCOUNTER — Ambulatory Visit: Payer: PPO | Admitting: Adult Health

## 2021-10-19 ENCOUNTER — Encounter: Payer: Self-pay | Admitting: Adult Health

## 2021-10-19 VITALS — BP 130/60 | HR 65 | Temp 98.3°F

## 2021-10-19 DIAGNOSIS — G4733 Obstructive sleep apnea (adult) (pediatric): Secondary | ICD-10-CM

## 2021-10-19 DIAGNOSIS — E669 Obesity, unspecified: Secondary | ICD-10-CM

## 2021-10-19 DIAGNOSIS — E66811 Obesity, class 1: Secondary | ICD-10-CM

## 2021-10-19 NOTE — Progress Notes (Signed)
? ?_0  ID: [name] [name], female    DOB: 02/02/1951, 71 y.o.   MRN: [id number] ? ?Chief Complaint  ?Patient presents with  ? Consult  ? ? ?Referring provider: ?Ria Bush, MD ? ?HPI: ?71 year old female seen for sleep consult Oct 19, 2021 to reestablish for sleep apnea ?Previously seen by Dr. Annamaria Boots  for sleep apnea last seen January 03, 2018 ?Patient has a history of severe sleep apnea diagnosed and 2006 with sleep study Nov 07 2004 showing AHI 45/hour.  Optimal control on CPAP 10 cm H2O. ?Medical history significant for coronary artery disease, depression, diabetes, hypertension ? ?TEST/EVENTS :  ?NPSG 11/07/04 Severe OSA, AHI 45/ hr, CPAP 10 ? ?10/19/2021 Sleep consult  ?Patient presents for a sleep consult today.  She was kindly referred by her primary care provider Dr. Dionisio Paschal.  Patient has a known history of sleep apnea diagnosed originally in 2006.  Sleep study Nov 07, 2004 shows severe sleep apnea with AHI at 45/hour with optimal control on CPAP 10 cm H2O.  Patient is on been on CPAP since then.  Says she cannot sleep without her CPAP.  She previously been seen by Dr. Annamaria Boots but has not been in office since 2019.  Patient says she has been doing well on her CPAP never misses a night.  She needs a new CPAP machine as hers is gotten old.  CPAP download shows 100% compliance with daily average usage at 7 hours.  Patient is on CPAP 10 cm H2O.  AHI 0.8.  Positive mask leaks.  Patient says she typically goes to bed about 11 PM.  Goes to sleep very quickly.  Only up maybe once at nighttime.  Typically up at 8 AM.  She does not operate heavy machinery.  Is retired.  Weight is up maybe 10 pounds over the last 2 years.  Current weight is at 181 pounds.  Epworth score is 6 out of 24.  Does get sometimes sleepy if she is watching TV or in the afternoon hours. No sleep aides.  ?No symptoms suspicious for cataplexy or sleep paralysis ?Caffeine intake 2 cup of coffee,.  ?[employer] is DME. Uses full face mask.   ? ?Medical history significant for hypertension, coronary artery disease, diabetes, hyperlipidemia, chronic allergies, GERD, depression, Arthritis /DJD , Emphysema on CT chest  ?Participates in Lung cancer screening program.  ? ?Surgical history foot surgery, bunionectomy, thyroid goiter surgery , coronary angioplasty with stent placement, tonsillectomy ? ?Social history Retired, former smoker . Married . Live at home with husband. Adult children x 3. 8 grandchildren. Social alcohol.  ? ? ? ?No Known Allergies ? ?Immunization History  ?Administered Date(s) Administered  ? Fluad Quad(high Dose 65+) 02/17/2020, 02/28/2021  ? Influenza Split 03/05/2012  ? Influenza, High Dose Seasonal PF 04/25/2015, 02/15/2016, 03/27/2017, 04/09/2018  ? Influenza,inj,Quad PF,6+ Mos 03/31/2013, 02/03/2014  ? PFIZER(Purple Top)SARS-COV-2 Vaccination 09/01/2019, 09/22/2019  ? Pneumococcal Conjugate-13 04/25/2015  ? Pneumococcal Polysaccharide-23 05/09/2011, 08/22/2016  ? Tdap 05/09/2011  ? ? ?Past Medical History:  ?Diagnosis Date  ? Acquired deformity of right foot   ? Back pain   ? CAD (coronary artery disease) primary cardioloigst-  dr Stanford Breed  ? hx NSTEMI 10-24-2001  s/p  cardiac cath w/ PCI and DES x1 to first diagonal  ? Chronic constipation   ? Constipation   ? COVID-19 06/02/2020  ? Depression   ? Fatty liver 04/26/2015  ? GERD (gastroesophageal reflux disease)   ? History of abnormal cervical Pap smear   ?  ACUS 2012  ? History of exercise stress test 05-25-2014  dr Stanford Breed  ? borderline (indeterminate) with non-specific ST changes (mild ST depression in leads II,III, aVF, & V6, did not meet criteria for ischemia) ;  pt had low intolerence, no cp, normal heartrate and bp response, no arrhythmias  ? History of goiter   ? as child s/p  removal ,  per pt benign , neck area  (not thyroid)  ? History of heart attack   ? History of non-ST elevation myocardial infarction (NSTEMI) 10/24/2001  ? s/p  PCI and DES to first diagonal  ?  Hyperlipidemia   ? Hypertension   ? Joint pain   ? OSA on CPAP pulmoloigst-  dr young  ? per study 11-07-2004  severe osa  ? Right foot pain   ? S/P drug eluting coronary stent placement 10/27/2001  ? x1 to first diagonal   ? Seasonal and perennial allergic rhinitis   ? Sleep apnea   ? SOB (shortness of breath)   ? Type 2 diabetes mellitus ([place])   ? Vitamin D deficiency   ? Wears dentures   ? upper and lower partial denture  ? Wears glasses   ? ? ?Tobacco History: ?Social History  ? ?Tobacco Use  ?Smoking Status Former  ? Packs/day: 1.50  ? Years: 48.00  ? Pack years: 72.00  ? Types: Cigarettes  ? Quit date: 09/23/2012  ? Years since quitting: 9.0  ?Smokeless Tobacco Never  ? ?Counseling given: Not Answered ? ? ?Outpatient Medications Prior to Visit  ?Medication Sig Dispense Refill  ? albuterol (VENTOLIN HFA) 108 (90 Base) MCG/ACT inhaler Inhale 1-2 puffs into the lungs every 6 (six) hours as needed for wheezing or shortness of breath. 1 each 0  ? aspirin 81 MG tablet Take 81 mg by mouth daily.    ? blood glucose meter kit and supplies KIT Dispense based on patient and insurance preference. Use up to four times daily as directed. (FOR ICD-9 250.00, 250.01). 1 each 0  ? buPROPion (WELLBUTRIN XL) 150 MG 24 hr tablet Take 3 tablets (450 mg total) by mouth daily. 270 tablet 3  ? Calcium Carbonate-Vitamin D 600-400 MG-UNIT tablet Take 2 tablets 2 (two) times daily by mouth.     ? carvedilol (COREG) 12.5 MG tablet TAKE 1 AND 1/2 TABLETS(18.75 MG) BY MOUTH TWICE DAILY 270 tablet 3  ? Cholecalciferol (VITAMIN D3) 3000 units TABS Take 1 tablet by mouth daily. 30 tablet   ? cloNIDine (CATAPRES) 0.1 MG tablet TAKE 1 TABLET(0.1 MG) BY MOUTH TWICE DAILY 180 tablet 2  ? Dulaglutide (TRULICITY) 2.29 NL/8.9QJ SOPN Inject 0.75 mg into the skin once a week. 2 mL 3  ? fluticasone (FLONASE) 50 MCG/ACT nasal spray Place 2 sprays into both nostrils daily. 16 g 3  ? glucose blood (ONETOUCH ULTRA) test strip Use as instructed to check blood  sugar once daily. 100 each 3  ? hydrALAZINE (APRESOLINE) 100 MG tablet TAKE 1 TABLET(100 MG) BY MOUTH IN THE MORNING AND AT BEDTIME 180 tablet 1  ? Lancet Devices (ONETOUCH DELICA PLUS LANCING) MISC Check blood sugar once a day 1 each 0  ? Lancets (ONETOUCH DELICA PLUS [id number]) MISC Check blood sugar once daily 100 each 3  ? metFORMIN (GLUCOPHAGE-XR) 750 MG 24 hr tablet Take 1 tablet (750 mg total) by mouth daily. TAKE 1 TABLET(750 MG) BY MOUTH ONCE DAILY 90 tablet 0  ? Multiple Vitamins-Minerals (MULTIVITAMIN ADULTS 50+ PO) Take 1 tablet every morning  by mouth.     ? olmesartan-hydrochlorothiazide (BENICAR HCT) 40-12.5 MG tablet TAKE 1 TABLET BY MOUTH DAILY 90 tablet 3  ? pantoprazole (PROTONIX) 40 MG tablet TAKE 1 TABLET(40 MG) BY MOUTH EVERY MORNING 90 tablet 3  ? polyethylene glycol (MIRALAX / GLYCOLAX) 17 g packet Take 17 g by mouth daily.    ? Probiotic Product (PROBIOTIC DAILY PO) Take 1 capsule every morning by mouth.     ? rosuvastatin (CRESTOR) 20 MG tablet TAKE 1 TABLET(20 MG) BY MOUTH DAILY 90 tablet 3  ? ?No facility-administered medications prior to visit.  ? ? ? ?Review of Systems:  ? ?Constitutional:   No  weight loss, night sweats,  Fevers, chills, fatigue, or  lassitude. ? ?HEENT:   No headaches,  Difficulty swallowing,  Tooth/dental problems, or  Sore throat,  ?              No sneezing, itching, ear ache, nasal congestion, post nasal drip,  ? ?CV:  No chest pain,  Orthopnea, PND, swelling in lower extremities, anasarca, dizziness, palpitations, syncope.  ? ?GI  No heartburn, indigestion, abdominal pain, nausea, vomiting, diarrhea, change in bowel habits, loss of appetite, bloody stools.  ? ?Resp: No shortness of breath with exertion or at rest.  No excess mucus, no productive cough,  No non-productive cough,  No coughing up of blood.  No change in color of mucus.  No wheezing.  No chest wall deformity ? ?Skin: no rash or lesions. ? ?GU: no dysuria, change in color of urine, no urgency or  frequency.  No flank pain, no hematuria  ? ?MS:  Back pain chronic  ? ? ? ?Physical Exam ? ?BP 130/60 (BP Location: Left Arm, Patient Position: Sitting, Cuff Size: Normal)   Pulse 65   Temp 98.3 ?F (36.8 ?C) (Oral)

## 2021-10-19 NOTE — Patient Instructions (Signed)
Continue on CPAP at bedtime ?Keep up the good work ?Order for new CPAP machine ?Remain active, work on healthy weight ?Do not drive if sleepy ?Follow-up with Dr. Annamaria Boots or Mabel Roll NP in 3-4 months and As needed   ?

## 2021-10-19 NOTE — Assessment & Plan Note (Signed)
Healthy weight loss discussed 

## 2021-10-19 NOTE — Assessment & Plan Note (Signed)
Severe obstructive sleep apnea with excellent control and compliance on nocturnal CPAP.  Patient is here to reestablish for her obstructive sleep apnea management.OSA management  ?Order for new CPAP to DME , continue same setting  ?- discussed how weight can impact sleep and risk for sleep disordered breathing ?- discussed options to assist with weight loss: combination of diet modification, cardiovascular and strength training exercises ?  ?- had an extensive discussion regarding the adverse health consequences related to untreated sleep disordered breathing ?- specifically discussed the risks for hypertension, coronary artery disease, cardiac dysrhythmias, cerebrovascular disease, and diabetes ?- lifestyle modification discussed ?  ?- discussed how sleep disruption can increase risk of accidents, particularly when driving ?- safe driving practices were discussed ?  ?Plan  ?Patient Instructions  ?Continue on CPAP at bedtime ?Keep up the good work ?Order for new CPAP machine ?Remain active, work on healthy weight ?Do not drive if sleepy ?Follow-up with Dr. Annamaria Boots or Gary Gabrielsen NP in 3-4 months and As needed   ?  ? ?

## 2021-11-14 DIAGNOSIS — D485 Neoplasm of uncertain behavior of skin: Secondary | ICD-10-CM | POA: Diagnosis not present

## 2021-11-14 DIAGNOSIS — C44319 Basal cell carcinoma of skin of other parts of face: Secondary | ICD-10-CM | POA: Diagnosis not present

## 2021-11-21 DIAGNOSIS — I10 Essential (primary) hypertension: Secondary | ICD-10-CM | POA: Diagnosis not present

## 2021-11-21 DIAGNOSIS — G4733 Obstructive sleep apnea (adult) (pediatric): Secondary | ICD-10-CM | POA: Diagnosis not present

## 2021-11-21 DIAGNOSIS — F32A Depression, unspecified: Secondary | ICD-10-CM | POA: Diagnosis not present

## 2021-11-21 DIAGNOSIS — R0683 Snoring: Secondary | ICD-10-CM | POA: Diagnosis not present

## 2021-12-01 ENCOUNTER — Encounter: Payer: Self-pay | Admitting: Family Medicine

## 2021-12-01 ENCOUNTER — Ambulatory Visit (INDEPENDENT_AMBULATORY_CARE_PROVIDER_SITE_OTHER): Payer: PPO | Admitting: Family Medicine

## 2021-12-01 VITALS — BP 112/64 | HR 69 | Temp 97.5°F | Ht 63.0 in | Wt 176.5 lb

## 2021-12-01 DIAGNOSIS — E118 Type 2 diabetes mellitus with unspecified complications: Secondary | ICD-10-CM | POA: Diagnosis not present

## 2021-12-01 DIAGNOSIS — K5909 Other constipation: Secondary | ICD-10-CM | POA: Diagnosis not present

## 2021-12-01 DIAGNOSIS — G4733 Obstructive sleep apnea (adult) (pediatric): Secondary | ICD-10-CM

## 2021-12-01 DIAGNOSIS — R194 Change in bowel habit: Secondary | ICD-10-CM

## 2021-12-01 LAB — POCT GLYCOSYLATED HEMOGLOBIN (HGB A1C): Hemoglobin A1C: 5.6 % (ref 4.0–5.6)

## 2021-12-01 MED ORDER — METFORMIN HCL ER 500 MG PO TB24: 500.0000 mg | ORAL_TABLET | Freq: Every day | ORAL | 1 refills | Status: DC

## 2021-12-01 MED ORDER — LINACLOTIDE 145 MCG PO CAPS: 145.0000 ug | ORAL_CAPSULE | Freq: Every day | ORAL | 6 refills | Status: DC

## 2021-12-01 MED ORDER — TRULICITY 0.75 MG/0.5ML ~~LOC~~ SOAJ: 0.7500 mg | SUBCUTANEOUS | 11 refills | Status: DC

## 2021-12-01 NOTE — Assessment & Plan Note (Signed)
Ongoing gassiness noted - will try lower metformin XR dose, drop to 500mg  nightly.  ?IBS

## 2021-12-04 ENCOUNTER — Telehealth: Payer: Self-pay

## 2021-12-06 MED ORDER — LUBIPROSTONE 24 MCG PO CAPS: 24.0000 ug | ORAL_CAPSULE | Freq: Two times a day (BID) | ORAL | 1 refills | Status: DC

## 2021-12-07 ENCOUNTER — Other Ambulatory Visit: Payer: Self-pay | Admitting: Family Medicine

## 2021-12-07 NOTE — Progress Notes (Signed)
Called patient to discuss Linzess cost and Amitiza cost; no answer; left message.  Charlene Brooke, CPP notified  Marijean Niemann, [place] Clinical Pharmacy Assistant [phone]

## 2021-12-08 MED ORDER — LUBIPROSTONE 24 MCG PO CAPS: 24.0000 ug | ORAL_CAPSULE | Freq: Two times a day (BID) | ORAL | 1 refills | Status: DC

## 2021-12-08 NOTE — Progress Notes (Signed)
Called patient to discuss Linzess cost and Amitiza cost; patient would like the Amitiza prescription to be transferred to [facility] [address] [place] Phone: [phone]. Patient can get it for $36 with the GoodRx card. With insurance Amitiza was only $7 cheaper than Linzess.   Charlene Brooke, CPP notified  Marijean Niemann, [place] Clinical Pharmacy Assistant [phone]

## 2021-12-08 NOTE — Telephone Encounter (Signed)
ERx 

## 2021-12-08 NOTE — Telephone Encounter (Signed)
Noted thank you

## 2021-12-08 NOTE — Telephone Encounter (Signed)
Pt is requesting a 90 day supply.

## 2021-12-08 NOTE — Addendum Note (Signed)
Addended by: Charlton Haws on: 12/08/2021 10:53 AM   Modules accepted: Orders

## 2021-12-11 DIAGNOSIS — C44319 Basal cell carcinoma of skin of other parts of face: Secondary | ICD-10-CM | POA: Diagnosis not present

## 2021-12-13 ENCOUNTER — Other Ambulatory Visit: Payer: Self-pay | Admitting: Cardiology

## 2021-12-14 ENCOUNTER — Encounter: Payer: Self-pay | Admitting: Cardiology

## 2021-12-20 ENCOUNTER — Other Ambulatory Visit: Payer: Self-pay | Admitting: Family Medicine

## 2021-12-20 DIAGNOSIS — E118 Type 2 diabetes mellitus with unspecified complications: Secondary | ICD-10-CM

## 2021-12-21 DIAGNOSIS — R0683 Snoring: Secondary | ICD-10-CM | POA: Diagnosis not present

## 2021-12-21 DIAGNOSIS — G4733 Obstructive sleep apnea (adult) (pediatric): Secondary | ICD-10-CM | POA: Diagnosis not present

## 2021-12-21 DIAGNOSIS — F32A Depression, unspecified: Secondary | ICD-10-CM | POA: Diagnosis not present

## 2021-12-21 DIAGNOSIS — I10 Essential (primary) hypertension: Secondary | ICD-10-CM | POA: Diagnosis not present

## 2021-12-27 ENCOUNTER — Ambulatory Visit: Payer: PPO

## 2022-01-01 ENCOUNTER — Encounter: Payer: Self-pay | Admitting: Family Medicine

## 2022-01-02 DIAGNOSIS — Z85828 Personal history of other malignant neoplasm of skin: Secondary | ICD-10-CM | POA: Diagnosis not present

## 2022-01-02 DIAGNOSIS — L821 Other seborrheic keratosis: Secondary | ICD-10-CM | POA: Diagnosis not present

## 2022-01-02 DIAGNOSIS — L578 Other skin changes due to chronic exposure to nonionizing radiation: Secondary | ICD-10-CM | POA: Diagnosis not present

## 2022-01-02 DIAGNOSIS — L918 Other hypertrophic disorders of the skin: Secondary | ICD-10-CM | POA: Diagnosis not present

## 2022-01-02 DIAGNOSIS — D225 Melanocytic nevi of trunk: Secondary | ICD-10-CM | POA: Diagnosis not present

## 2022-01-02 NOTE — Telephone Encounter (Signed)
Patient to know if there is anything cheaper besides Trullicity. Call back [phone].

## 2022-01-02 NOTE — Telephone Encounter (Signed)
Spoke with pt asking her to contact her ins co to see what is more affordable in the "Trulicity family".  Pt verbalizes understanding and will either call back or send MyChart message.

## 2022-01-03 ENCOUNTER — Ambulatory Visit
Admission: RE | Admit: 2022-01-03 | Discharge: 2022-01-03 | Disposition: A | Discharge status: Home / Self Care | Payer: PPO | Source: Ambulatory Visit | Attending: Acute Care | Admitting: Acute Care

## 2022-01-03 DIAGNOSIS — Z87891 Personal history of nicotine dependence: Secondary | ICD-10-CM | POA: Diagnosis not present

## 2022-01-04 ENCOUNTER — Other Ambulatory Visit: Payer: Self-pay | Admitting: Acute Care

## 2022-01-04 DIAGNOSIS — Z122 Encounter for screening for malignant neoplasm of respiratory organs: Secondary | ICD-10-CM

## 2022-01-04 DIAGNOSIS — Z87891 Personal history of nicotine dependence: Secondary | ICD-10-CM

## 2022-01-09 ENCOUNTER — Telehealth: Payer: Self-pay

## 2022-01-09 NOTE — Progress Notes (Signed)
    Chronic Care Management Pharmacy Assistant   Name: Cherolyn Behrle  MRN: [id number] DOB: [birth year]  Reason for Encounter: CCM (Appointment Reminder)  Medications: Outpatient Encounter Medications as of 01/09/2022  Medication Sig   lubiprostone (AMITIZA) 24 MCG capsule TAKE 1 CAPSULE(24 MCG) BY MOUTH TWICE DAILY WITH A MEAL   albuterol (VENTOLIN HFA) 108 (90 Base) MCG/ACT inhaler Inhale 1-2 puffs into the lungs every 6 (six) hours as needed for wheezing or shortness of breath.   aspirin 81 MG tablet Take 81 mg by mouth daily.   blood glucose meter kit and supplies KIT Dispense based on patient and insurance preference. Use up to four times daily as directed. (FOR ICD-9 250.00, 250.01).   buPROPion (WELLBUTRIN XL) 150 MG 24 hr tablet Take 3 tablets (450 mg total) by mouth daily.   carvedilol (COREG) 12.5 MG tablet TAKE 1 AND 1/2 TABLETS(18.75 MG) BY MOUTH TWICE DAILY   Cholecalciferol (VITAMIN D3) 3000 units TABS Take 1 tablet by mouth daily.   cloNIDine (CATAPRES) 0.1 MG tablet TAKE 1 TABLET(0.1 MG) BY MOUTH TWICE DAILY   Dulaglutide (TRULICITY) 0.81 KG/8.1EH SOPN Inject 0.75 mg into the skin once a week.   fluticasone (FLONASE) 50 MCG/ACT nasal spray Place 2 sprays into both nostrils daily.   glucose blood (ONETOUCH ULTRA) test strip Use as instructed to check blood sugar once a day   hydrALAZINE (APRESOLINE) 100 MG tablet TAKE 1 TABLET(100 MG) BY MOUTH IN THE MORNING AND AT BEDTIME   Lancet Devices (ONETOUCH DELICA PLUS LANCING) MISC Check blood sugar once a day   Lancets (ONETOUCH DELICA PLUS [id number]) MISC Check blood sugar once daily   metFORMIN (GLUCOPHAGE-XR) 500 MG 24 hr tablet Take 1 tablet (500 mg total) by mouth daily.   Multiple Vitamins-Minerals (MULTIVITAMIN ADULTS 50+ PO) Take 1 tablet every morning by mouth.    olmesartan-hydrochlorothiazide (BENICAR HCT) 40-12.5 MG tablet TAKE 1 TABLET BY MOUTH DAILY   pantoprazole (PROTONIX) 40 MG tablet TAKE 1 TABLET(40 MG) BY MOUTH  EVERY MORNING   polyethylene glycol (MIRALAX / GLYCOLAX) 17 g packet Take 17 g by mouth daily.   Probiotic Product (PROBIOTIC DAILY PO) Take 1 capsule every morning by mouth.    rosuvastatin (CRESTOR) 20 MG tablet TAKE 1 TABLET(20 MG) BY MOUTH DAILY   No facility-administered encounter medications on file as of 01/09/2022.   Kjersten Ormiston was contacted to remind of upcoming telephone visit with Charlene Brooke  on 01/12/22 at 10:00. Patient was reminded to have any blood glucose and blood pressure readings available for review at appointment.   Message was left reminding patient of appointment.   CCM referral has been placed prior to visit?  No    Star Rating Drugs: Medication:  Last Fill: Day Supply Trulicity 3.78 mg 58/85/02 28 Metformin 500 mg 12/01/21 90 Rosuvastatin 20 mg 11/29/21 [place], CPP notified  Marijean Niemann, [place] Pharmacy Assistant [phone]

## 2022-01-12 ENCOUNTER — Ambulatory Visit: Payer: PPO | Admitting: Pharmacist

## 2022-01-12 ENCOUNTER — Telehealth: Payer: PPO

## 2022-01-12 DIAGNOSIS — E118 Type 2 diabetes mellitus with unspecified complications: Secondary | ICD-10-CM

## 2022-01-12 DIAGNOSIS — F331 Major depressive disorder, recurrent, moderate: Secondary | ICD-10-CM

## 2022-01-12 DIAGNOSIS — I10 Essential (primary) hypertension: Secondary | ICD-10-CM

## 2022-01-12 DIAGNOSIS — K5909 Other constipation: Secondary | ICD-10-CM

## 2022-01-12 DIAGNOSIS — E1169 Type 2 diabetes mellitus with other specified complication: Secondary | ICD-10-CM

## 2022-01-12 NOTE — Progress Notes (Signed)
Chronic Care Management Pharmacy Note  01/12/2022 Name:  Texie Tupou MRN:  [id number] DOB:  1951/03/19  Summary: CCM F/U visit -Pt continues to have constipation; she stopped lubiprostone after having stomach pains; she had stopped Miralax when starting lubiprostone; discussed importance of fluid intake and fiber intake for constipation  Recommendations/Changes made from today's visit: -Advised increasing fluid and fiber intake; can re-trial lubiprostone with Miralax as well, caution too many changes at once, risk for conversion to diarrhea  Plan: -[place] will call patient 3 months for general update -Pharmacist follow up televisit scheduled for 6 months -PCP 19-monthf/u 03/05/22   Subjective: DJaclin Finksis an 71y.o. year old female who is a primary patient of [name] MD.  The CCM team was consulted for assistance with disease management and care coordination needs.    Engaged with patient by telephone for follow up visit in response to provider referral for pharmacy case management and/or care coordination services.   Consent to Services:  The patient was given information about Chronic Care Management services, agreed to services, and gave verbal consent prior to initiation of services.  Please see initial visit note for detailed documentation.   Patient Care Team: [name] MD as PCP - General (Family Medicine) CStanford BreedBDenice Bors MD as PCP - Cardiology (Cardiology) CStanford BreedBDenice Bors MD as Consulting Physician (Cardiology) PSable Feil MD as Consulting Physician (Gastroenterology) MAzucena Fallen MD as Consulting Physician (Obstetrics and Gynecology) FCharlton Haws [facility] Center[facility] Pharmacist (Pharmacist)  Recent office visits: 12/01/21 Dr GDanise MinaOV: f/u DM - A1c 5.6. Reduce metformin to 500 mg daily. Trial Linzess 145 mcg for constipation. Switched to lubiprostone for cost.  10/10/21 TE - referred to pulm for CPAP. 08/30/21 Dr  GDanise MinaOV: f/u DM. A1C 6.0%; not checking sugars. Start Trulicity 01.77mg weekly (wt loss/DM). RTC  3 months. 07/05/2021 - JKarl Ito NP - Video Visit - Patient presented for Covid-19. Start: Paxlovid. 02/28/2021 - JRia Bush MD - Patient presented for Annual Wellness Visit. Orders: D-dimer. Referral to Gastroenterology. Immunizations given: FQUALCOMM(high dose 65+).   Recent consult visits: 09/01/21 Dr CStanford Breed(Cardiology): f/u CAD; no changes. 08/01/21 PA Aguilar (weight mgmt): visit #10. Net weight: +2 lbs 06/29/2021 - TAbby Potash PA - Weight Management - Patient presented for weight loss. Labs: CMP, A1c, Insulin, Lipid panel and Vit. D.  06/27/2021 - [facility] -Endoscopy center - Patient presented for Colonoscopy.  06/14/2021 -Abby Potash PA - Weight Management  05/17/2021 - CLynder [facility] No information.  04/17/2021 -Abby Potash PA - Weight Management  03/29/2021 - [facility]- Obstetrics and Gynecology - Patient presented for routine gynecological exam. Procedures: Breast exam and PAP smear.  03/22/2021 -Abby Potash PA - Weight Management  03/03/2021 - TAbby Potash PA - Weight Management  02/16/2021 - AJearld Lesch DO - Bariatrics - Patient presented for diabetes. No medication changes.  02/07/2021 - [facility]rel[facility] - Patient presented for type 2 diabetes. Procedure: eye exam and PR Refraction.  02/02/2021 - AJearld Lesch DO - Bariatrics - Patient presented for fatigue. Labs: CMP, A1c, Insulin, Lipid, T3, T4, TSH, Vit D and EKG.  Hospital visits: None in previous 6 months   Objective:  Lab Results  Component Value Date   CREATININE 0.76 06/29/2021   BUN 16 06/29/2021   GFR 77.58 02/10/2020   EGFR 84 06/29/2021   GFRNONAA 80 06/04/2019   GFRAA 93 06/04/2019  NA 145 (H) 06/29/2021   K 3.9 06/29/2021   CALCIUM 9.4 06/29/2021   CO2 25 06/29/2021   GLUCOSE 124 (H) 06/29/2021    Lab Results  Component Value  Date/Time   HGBA1C 5.6 12/01/2021 09:38 AM   HGBA1C 6.0 (A) 08/30/2021 10:05 AM   HGBA1C 6.8 (H) 06/29/2021 11:03 AM   HGBA1C 6.2 (H) 02/02/2021 09:14 AM   GFR 77.58 02/10/2020 10:34 AM   GFR 72.22 10/06/2018 10:58 AM   MICROALBUR 1.3 02/10/2020 10:34 AM   MICROALBUR 3.6 (H) 11/19/2018 09:29 AM    Last diabetic Eye exam:  Lab Results  Component Value Date/Time   HMDIABEYEEXA No Retinopathy 02/07/2021 12:00 AM    Last diabetic Foot exam:  Lab Results  Component Value Date/Time   HMDIABFOOTEX yes 01/11/2009 12:00 AM     Lab Results  Component Value Date   CHOL 113 06/29/2021   HDL 46 06/29/2021   LDLCALC 43 06/29/2021   LDLDIRECT 77.7 12/27/2009   TRIG 141 06/29/2021   CHOLHDL 2.5 06/29/2021       Latest Ref Rng & Units 06/29/2021   11:03 AM 02/02/2021    9:14 AM 02/10/2020   10:34 AM  Hepatic Function  Total Protein 6.0 - 8.5 g/dL 6.8  6.5  6.9   Albumin 3.7 - 4.7 g/dL 4.4  4.3  4.4   AST 0 - 40 IU/L '17  18  19   ' ALT 0 - 32 IU/L '21  18  24   ' Alk Phosphatase 44 - 121 IU/L 65  64  51   Total Bilirubin 0.0 - 1.2 mg/dL 0.5  0.5  0.5     Lab Results  Component Value Date/Time   TSH 1.050 02/02/2021 09:14 AM   TSH 0.93 06/09/2018 03:45 PM   FREET4 1.23 02/02/2021 09:14 AM   FREET4 1.17 09/05/2016 12:02 PM       Latest Ref Rng & Units 02/10/2020   10:34 AM 06/09/2018    3:45 PM 04/19/2017   11:42 AM  CBC  WBC 4.0 - 10.5 K/uL 4.4  6.3    Hemoglobin 12.0 - 15.0 g/dL 12.4  13.1  21.4   Hematocrit 36.0 - 46.0 % 37.0  38.9  63.0   Platelets 150.0 - 400.0 K/uL 157.0  192.0      Lab Results  Component Value Date/Time   VD25OH 67.1 06/29/2021 11:03 AM   VD25OH 62.4 02/02/2021 09:14 AM   VD25OH 80.69 02/10/2020 10:34 AM    Clinical ASCVD: Yes  The ASCVD Risk score (Arnett DK, et al., 2019) failed to calculate for the following reasons:   The valid total cholesterol range is 130 to 320 mg/dL       02/28/2021   12:00 PM 02/02/2021    7:54 AM 07/22/2020    3:53 PM   Depression screen PHQ 2/9  Decreased Interest 0 1 0  Down, Depressed, Hopeless 0 2 0  PHQ - 2 Score 0 3 0  Altered sleeping 2 0   Tired, decreased energy 3 3   Change in appetite 2 3   Feeling bad or failure about yourself  0 0   Trouble concentrating 0 3   Moving slowly or fidgety/restless 0 1   Suicidal thoughts 0 0   PHQ-9 Score 7 13   Difficult doing work/chores  Not difficult at all      Social History   Tobacco Use  Smoking Status Former   Packs/day: 1.50   Years: 48.00  Total pack years: 72.00   Types: Cigarettes   Quit date: 09/23/2012   Years since quitting: 9.3  Smokeless Tobacco Never   BP Readings from Last 3 Encounters:  12/01/21 112/64  10/19/21 130/60  09/01/21 114/66   Pulse Readings from Last 3 Encounters:  12/01/21 69  10/19/21 65  09/01/21 68   Wt Readings from Last 3 Encounters:  12/01/21 176 lb 8 oz (80.1 kg)  09/01/21 181 lb (82.1 kg)  08/30/21 181 lb (82.1 kg)   BMI Readings from Last 3 Encounters:  12/01/21 31.27 kg/m  09/01/21 32.06 kg/m  08/30/21 32.06 kg/m    Assessment/Interventions: Review of patient past medical history, allergies, medications, health status, including review of consultants reports, laboratory and other test data, was performed as part of comprehensive evaluation and provision of chronic care management services.   SDOH:  (Social Determinants of Health) assessments and interventions performed: No - done 07/2021   SDOH Screenings   Alcohol Screen: Low Risk  (02/10/2020)   Alcohol Screen    Last Alcohol Screening Score (AUDIT): 1  Depression (PHQ2-9): Medium Risk (02/28/2021)   Depression (PHQ2-9)    PHQ-2 Score: 7  Financial Resource Strain: Low Risk  (07/18/2021)   Overall Financial Resource Strain (CARDIA)    Difficulty of Paying Living Expenses: Not hard at all  Food Insecurity: No Food Insecurity (07/18/2021)   Hunger Vital Sign    Worried About Running Out of Food in the Last Year: Never true    [place] in the Last Year: Never true  Housing: Low Risk  (02/10/2020)   Housing    Last Housing Risk Score: 0  Physical Activity: Inactive (02/10/2020)   Exercise Vital Sign    Days of Exercise per Week: 0 days    Minutes of Exercise per Session: 0 min  Social Connections: Not on file  Stress: No Stress Concern Present (02/10/2020)   [place]    Feeling of Stress : Not at all  Tobacco Use: Medium Risk (12/01/2021)   Patient History    Smoking Tobacco Use: Former    Smokeless Tobacco Use: Never    Passive Exposure: Not on file  Transportation Needs: No Transportation Needs (02/10/2020)   PRAPARE - Hydrologist (Medical): No    Lack of Transportation (Non-Medical): No    CCM Care Plan  No Known Allergies  Medications Reviewed Today     Reviewed by Charlton Haws, [facility] (Pharmacist) on 01/12/22 at [place] List Status: <None>   Medication Order Taking? Sig Documenting Provider Last Dose Status Informant  albuterol (VENTOLIN HFA) 108 (90 Base) MCG/ACT inhaler [id number] Yes Inhale 1-2 puffs into the lungs every 6 (six) hours as needed for wheezing or shortness of breath. Ria Bush, MD Taking Active   aspirin 81 MG tablet [id number] Yes Take 81 mg by mouth daily. [provider] Taking Active   blood glucose meter kit and supplies KIT [id number] Yes Dispense based on patient and insurance preference. Use up to four times daily as directed. (FOR ICD-9 250.00, 250.01). Elby Beck, FNP Taking Active   buPROPion (WELLBUTRIN XL) 150 MG 24 hr tablet [id number] Yes Take 3 tablets (450 mg total) by mouth daily. Purnell Shoemaker., MD Taking Active   carvedilol (COREG) 12.5 MG tablet [id number] Yes TAKE 1 AND 1/2 TABLETS(18.75 MG) BY MOUTH TWICE DAILY Crenshaw, Denice Bors, MD Taking Active   Cholecalciferol (  VITAMIN D3) 3000 units TABS [id number] Yes Take 1 tablet by mouth daily.  Debbrah Alar, NP Taking Active   cloNIDine (CATAPRES) 0.1 MG tablet [id number] Yes TAKE 1 TABLET(0.1 MG) BY MOUTH TWICE DAILY Stanford Breed, Denice Bors, MD Taking Active   Dulaglutide (TRULICITY) 3.89 HT/3.4KA SOPN [id number] Yes Inject 0.75 mg into the skin once a week. Ria Bush, MD Taking Active   fluticasone [facility]) 50 MCG/ACT nasal spray [id number] Yes Place 2 sprays into both nostrils daily. Ria Bush, MD Taking Active   glucose blood [facility] ULTRA) test strip [id number] Yes Use as instructed to check blood sugar once a day Ria Bush, MD Taking Active   hydrALAZINE (APRESOLINE) 100 MG tablet [id number] Yes TAKE 1 TABLET(100 MG) BY MOUTH IN THE MORNING AND AT BEDTIME Lelon Perla, MD Taking Active   Lancet Devices [facility] PLUS LANCING) [place] [id number] Yes Check blood sugar once a day Ria Bush, MD Taking Active   Lancets (ONETOUCH DELICA PLUS [id number]) [place] [id number] Yes Check blood sugar once daily Ria Bush, MD Taking Active   lubiprostone (AMITIZA) 24 MCG capsule [id number] No TAKE 1 CAPSULE(24 MCG) BY MOUTH TWICE DAILY WITH A MEAL  Patient not taking: Reported on 01/12/2022   Ria Bush, MD Not Taking Active   metFORMIN (GLUCOPHAGE-XR) 500 MG 24 hr tablet [id number] Yes Take 1 tablet (500 mg total) by mouth daily. Ria Bush, MD Taking Active   Multiple Vitamins-Minerals (MULTIVITAMIN ADULTS 50+ PO) [id number] Yes Take 1 tablet every morning by mouth.  [provider] Taking Active Self  olmesartan-hydrochlorothiazide (BENICAR HCT) 40-12.5 MG tablet [id number] Yes TAKE 1 TABLET BY MOUTH DAILY Lelon Perla, MD Taking Active   pantoprazole (PROTONIX) 40 MG tablet [id number] Yes TAKE 1 TABLET(40 MG) BY MOUTH EVERY MORNING Ria Bush, MD Taking Active   polyethylene glycol (MIRALAX / GLYCOLAX) 17 g packet [id number] Yes Take 17 g by mouth daily. [provider] Taking Active   Probiotic Product (PROBIOTIC  DAILY PO) [id number] Yes Take 1 capsule every morning by mouth.  [provider] Taking Active Self  rosuvastatin (CRESTOR) 20 MG tablet [id number] Yes TAKE 1 TABLET(20 MG) BY MOUTH DAILY Stanford Breed Denice Bors, MD Taking Active             Patient Active Problem List   Diagnosis Date Noted   Medicare annual wellness visit, subsequent 02/28/2021   Advanced directives, counseling/discussion 02/28/2021   Left leg swelling 02/28/2021   Ex-smoker 11/09/2020   Tongue coating 10/21/2020   Bowel habit changes 07/23/2020   Primary osteoarthritis of both first carpometacarpal joints 05/01/2018   Chronic constipation 01/17/2017   NAFLD (nonalcoholic fatty liver disease) 09/20/2016   Vitamin D deficiency 09/20/2016   Obesity, Class I, BMI 30-34.9 09/05/2016   Left carpal tunnel syndrome 08/18/2015   Intertrigo 05/28/2012   Microalbuminuria 11/29/2011   Internal and external hemorrhoids without complication 04/13/1593   Benign neoplasm of colon 05/30/2011   Health maintenance examination 05/09/2011   Essential hypertension, benign 01/12/2009   Controlled diabetes mellitus type 2 with complications ([place]) 58/59/2924   Hyperlipidemia associated with type 2 diabetes mellitus ([place]) 01/11/2007   MDD (major depressive disorder), recurrent episode, moderate ([place]) 01/11/2007   OBSTRUCTIVE SLEEP APNEA 01/11/2007   Coronary atherosclerosis 01/11/2007   Seasonal and perennial allergic rhinitis 01/11/2007   GERD 01/11/2007    Immunization History  Administered Date(s) Administered   Fluad Quad(high Dose 65+) 02/17/2020, 02/28/2021   Influenza Split 03/05/2012   Influenza, High Dose Seasonal PF 04/25/2015, 02/15/2016, 03/27/2017,  04/09/2018   Influenza,inj,Quad PF,6+ Mos 03/31/2013, 02/03/2014   PFIZER(Purple Top)SARS-COV-2 Vaccination 09/01/2019, 09/22/2019   Pneumococcal Conjugate-13 04/25/2015   Pneumococcal Polysaccharide-23 05/09/2011, 08/22/2016   Tdap 05/09/2011    Conditions to be  addressed/monitored:  Hypertension, Hyperlipidemia, Diabetes, Coronary Artery Disease, and GERD  Care Plan : [place]  Updates made by Charlton Haws, [place] since 01/12/2022 12:00 AM     Problem: Hypertension, Hyperlipidemia, Diabetes, Coronary Artery Disease, and GERD   Priority: High     Long-Range Goal: Disease mgmt   Start Date: 07/18/2021  Expected End Date: 07/18/2022  This Visit's Progress: On track  Recent Progress: On track  Priority: High  Note:   Current Barriers:  Unable to maintain control of constipation  Pharmacist Clinical Goal(s):  Patient will achieve improvement in constipation as evidenced by pt report through collaboration with PharmD and provider.   Interventions: 1:1 collaboration with Ria Bush, MD regarding development and update of comprehensive plan of care as evidenced by provider attestation and co-signature Inter-disciplinary care team collaboration (see longitudinal plan of care) Comprehensive medication review performed; medication list updated in electronic medical record  Hypertension (BP goal <130/80) -Controlled - BP is at goal in clinic -Current home readings: <130/80 -Denies hypotensive/hypertensive symptoms -Hx OSA on CPAP; BP meds per Dr Stanford Breed -Current treatment: Carvedilol 12.5 mg 1.5 tab BID - Appropriate, Effective, Safe, Accessible Hydralazine 100 mg BID- Appropriate, Effective, Safe, Accessible Clonidine 0.1 mg BID- Appropriate, Effective, Safe, Accessible Olmesartan-HCTZ 40-12.5 mg daily- Appropriate, Effective, Safe, Accessible -Medications previously tried: n/a  -Educated on BP goals and benefits of medications for prevention of heart attack, stroke and kidney damage; Importance of home blood pressure monitoring; Proper BP monitoring technique;  -Counseled to monitor BP at home periodically -Recommended to continue current medication  Hyperlipidemia: (LDL goal < 70) -Controlled - LDL 43 (06/2021) is at  goal; pt endorses compliance with statin and denies issues -Hx coronary atherosclerosis. Sees cardiology (Dr Stanford Breed) -Current treatment: Rosuvastatin 20 mg daily- Appropriate, Effective, Safe, Accessible Aspirin 81 mg daily- Appropriate, Effective, Safe, Accessible -Medications previously tried: n/a  -Educated on Cholesterol goals; Benefits of statin for ASCVD risk reduction; -Recommended to continue current medication  Diabetes (A1c goal <7%) -Controlled - A1c 5.6% (11/2021) recently; she is enrolled in weight management and actively modifying lifestyle to lose weight, she has lost 5 lbs since Aug 2022; she endorses compliance with weekly Trulicity and denies side effects, she has noticed reduced appetite/feeling full faster -Hx NAFLD -Current home glucose readings fasting glucose: 120-150 post prandial glucose: not checking -Current medications: Metformin XR 500 mg daily HS- Appropriate, Effective, Safe, Accessible Trulicity 7.40 mg weekly - Appropriate, Effective, Safe, Accessible Testing supplies -Medications previously tried: Victoza (pt unsure why she stopped) -Current meal patterns: trying to have more high-protein meals, limit sweets and carbs -Current exercise: limited -Recommend to continue current medication  Depression/Anxiety (Goal: manage symptoms) -Controlled - per pt report -PHQ9: 7 (02/2021) - mild depression -GAD7: 2 (02/2021) - no/minimal anxiety -Connected with psychiatry (Dr Clovis Pu) for mental health support -Current treatment: Bupropion XL 150 mg - 3 tab daily - Appropriate, Effective, Safe, Accessible -Medications previously tried/failed: Abilify, Vraylar -Educated on Benefits of medication for symptom control -Recommended to continue current medication  GERD (Goal: manage symptoms) -Controlled - per pt report -Current treatment  Pantoprazole 40 mg daily - Appropriate, Effective, Safe, Accessible -Medications previously tried: n/a  -Recommended to  continue current medication  Chronic constipation (Goal: improve symptoms) -Not ideally controlled - pt  stopped taking lubiprostone due to stomach pains, though she had stopped Miralax when starting Rx -Pt reports she does not drink enough water -Current treatment  Lubiprostone 24 mcg BID - Appropriate, Query Effective Miralax -Appropriate, Query Effective Probiotic -Appropriate, Query Effective -Medications previously tried: n/a -Reviewed importance of hydration and fiber intake to improve constipation -Advised she can re-trial lubiprostone with Miralax, advised not to change too many things at once for risk of conversion to diarrhea  Health Maintenance -Vaccine gaps: Shingrix, TDAP  Patient Goals/Self-Care Activities Patient will:  - take medications as prescribed as evidenced by patient report and record review focus on medication adherence by pill box check glucose daily (varying times), document, and provide at future appointments check blood pressure daily, document, and provide at future appointments engage in dietary modifications by increasing water and fiber intake      Medication Assistance: None required.  Patient affirms current coverage meets needs.  Compliance/Adherence/Medication fill history: Care Gaps: NONE  Star-Rating Drugs: Metformin - PDC 71% (reduced dose 11/2021, LF 12/01/21) Olmesartan-HCTZ - PDC 94% Rosuvastatin - PDC 40% Trulicity - PDC 68%  Medication Access: Within the past 30 days, how often has patient missed a dose of medication? 0 Is a pillbox or other method used to improve adherence? No  Factors that may affect medication adherence? no barriers identified Are meds synced by current pharmacy? No  Are meds delivered by current pharmacy? No  Does patient experience delays in picking up medications due to transportation concerns? No   [facility] Services Reviewed: Is patient disadvantaged to use [facility]?: No  Current Rx insurance  plan: HTA Name and location of Current pharmacy:  [facility] [place], [address] [place] [address] Phone: [phone] Fax: [phone]  [facility] services reviewed with patient today?: No  Patient requests to transfer care to [facility] Pharm[facility]?: No  Reason patient declined to change pharmacies: Not mentioned at this visit   Care Plan and Follow Up Patient Decision:  Patient agrees to Care Plan and Follow-up.  Plan: Telephone follow up appointment with care management team member scheduled for:  6 months  Charlene Brooke, PharmD, BCACP Clinical Pharmacist [facility] [phone]

## 2022-01-12 NOTE — Patient Instructions (Signed)
Visit Information  Phone number for Pharmacist: [phone]   Goals Addressed   None     Care Plan : [place]  Updates made by Charlton Haws, RPH since 01/12/2022 12:00 AM     Problem: Hypertension, Hyperlipidemia, Diabetes, Coronary Artery Disease, and GERD   Priority: High     Long-Range Goal: Disease mgmt   Start Date: 07/18/2021  Expected End Date: 07/18/2022  This Visit's Progress: On track  Recent Progress: On track  Priority: High  Note:   Current Barriers:  Unable to maintain control of constipation  Pharmacist Clinical Goal(s):  Patient will achieve improvement in constipation as evidenced by pt report through collaboration with PharmD and provider.   Interventions: 1:1 collaboration with Ria Bush, MD regarding development and update of comprehensive plan of care as evidenced by provider attestation and co-signature Inter-disciplinary care team collaboration (see longitudinal plan of care) Comprehensive medication review performed; medication list updated in electronic medical record  Hypertension (BP goal <130/80) -Controlled - BP is at goal in clinic -Current home readings: <130/80 -Denies hypotensive/hypertensive symptoms -Hx OSA on CPAP; BP meds per Dr Stanford Breed -Current treatment: Carvedilol 12.5 mg 1.5 tab BID - Appropriate, Effective, Safe, Accessible Hydralazine 100 mg BID- Appropriate, Effective, Safe, Accessible Clonidine 0.1 mg BID- Appropriate, Effective, Safe, Accessible Olmesartan-HCTZ 40-12.5 mg daily- Appropriate, Effective, Safe, Accessible -Medications previously tried: n/a  -Educated on BP goals and benefits of medications for prevention of heart attack, stroke and kidney damage; Importance of home blood pressure monitoring; Proper BP monitoring technique;  -Counseled to monitor BP at home periodically -Recommended to continue current medication  Hyperlipidemia: (LDL goal < 70) -Controlled - LDL 43 (06/2021) is at  goal; pt endorses compliance with statin and denies issues -Hx coronary atherosclerosis. Sees cardiology (Dr Stanford Breed) -Current treatment: Rosuvastatin 20 mg daily- Appropriate, Effective, Safe, Accessible Aspirin 81 mg daily- Appropriate, Effective, Safe, Accessible -Medications previously tried: n/a  -Educated on Cholesterol goals; Benefits of statin for ASCVD risk reduction; -Recommended to continue current medication  Diabetes (A1c goal <7%) -Controlled - A1c 5.6% (11/2021) recently; she is enrolled in weight management and actively modifying lifestyle to lose weight, she has lost 5 lbs since Aug 2022; she endorses compliance with weekly Trulicity and denies side effects, she has noticed reduced appetite/feeling full faster -Hx NAFLD -Current home glucose readings fasting glucose: 120-150 post prandial glucose: not checking -Current medications: Metformin XR 500 mg daily HS- Appropriate, Effective, Safe, Accessible Trulicity 9.79 mg weekly - Appropriate, Effective, Safe, Accessible Testing supplies -Medications previously tried: Victoza (pt unsure why she stopped) -Current meal patterns: trying to have more high-protein meals, limit sweets and carbs -Current exercise: limited -Recommend to continue current medication  Depression/Anxiety (Goal: manage symptoms) -Controlled - per pt report -PHQ9: 7 (02/2021) - mild depression -GAD7: 2 (02/2021) - no/minimal anxiety -Connected with psychiatry (Dr Clovis Pu) for mental health support -Current treatment: Bupropion XL 150 mg - 3 tab daily - Appropriate, Effective, Safe, Accessible -Medications previously tried/failed: Abilify, Vraylar -Educated on Benefits of medication for symptom control -Recommended to continue current medication  GERD (Goal: manage symptoms) -Controlled - per pt report -Current treatment  Pantoprazole 40 mg daily - Appropriate, Effective, Safe, Accessible -Medications previously tried: n/a  -Recommended to  continue current medication  Chronic constipation (Goal: improve symptoms) -Not ideally controlled - pt stopped taking lubiprostone due to stomach pains, though she had stopped Miralax when starting Rx -Pt reports she does not drink enough water -Current treatment  Lubiprostone 24 mcg  BID - Appropriate, Query Effective Miralax -Appropriate, Query Effective Probiotic -Appropriate, Query Effective -Medications previously tried: n/a -Reviewed importance of hydration and fiber intake to improve constipation -Advised she can re-trial lubiprostone with Miralax, advised not to change too many things at once for risk of conversion to diarrhea  Health Maintenance -Vaccine gaps: Shingrix, TDAP  Patient Goals/Self-Care Activities Patient will:  - take medications as prescribed as evidenced by patient report and record review focus on medication adherence by pill box check glucose daily (varying times), document, and provide at future appointments check blood pressure daily, document, and provide at future appointments engage in dietary modifications by increasing water and fiber intake       Patient verbalizes understanding of instructions and care plan provided today and agrees to view in [place]. Active MyChart status and patient understanding of how to access instructions and care plan via MyChart confirmed with patient.    Telephone follow up appointment with pharmacy team member scheduled for: 6 months  Charlene Brooke, PharmD, [facility] Clinical Pharmacist [facility] [phone]

## 2022-01-16 ENCOUNTER — Encounter: Payer: Self-pay | Admitting: Internal Medicine

## 2022-01-17 ENCOUNTER — Encounter (INDEPENDENT_AMBULATORY_CARE_PROVIDER_SITE_OTHER): Payer: Self-pay

## 2022-01-17 NOTE — Progress Notes (Signed)
HPI-  female former smoker followed for OSA, complicated by allergic rhinitis, CAD/MI/stent, obesity, depression, DM 2, HBP, GERD, Allergic Rhinitis,  NPSG 11/07/04 Severe OSA, AHI 45/ hr, CPAP 10.   ==============================================   01/03/2018-71 year old female former smoker followed for OSA, complicated by allergic rhinitis, CAD/MI/stent, obesity, depression, DM 2, HBP, GERD,  CPAP 10/[facility] >> 9 today -----Follows For: Sleep Apnea, feels like her mask is leaking air,  She sleeps much better with CPAP and cannot sleep well without it. Download 100% compliance AHI 0.3/hour.  Significant leak now.  01/19/22-71 year old female former smoker (50 pkyrs) followed for OSA, complicated by allergic rhinitis, CAD/MI/stent, obesity, depression, DM 2, HBP, GERD,  CPAP 10/ [facility]     AirSense 11 AutoSet Download compliance-97%, AHI 1/ hr Body weight today-176 lbs Seen 5/11 by Royal Piedra, NP and replacement machine ordered then. Download reviewed. Good compliance and control. CT reviewed.  She follows with cardiology for ASCVD. CT chest low dose screen 01/03/22- stable nodules noted IMPRESSION: 1. Lung-RADS 2, benign appearance or behavior. Continue annual screening with low-dose chest CT without contrast in 12 months. 2. Severe three-vessel coronary artery calcifications. 3. Aortic Atherosclerosis (ICD10-I70.0) and Emphysema (ICD10-J43.9).   ROS-see HPI   + = positive Constitutional:    weight loss, night sweats, fevers, chills, +fatigue, lassitude. HEENT:    headaches, difficulty swallowing, tooth/dental problems, sore throat,       sneezing, itching, ear ache, nasal congestion, post nasal drip, snoring CV:    chest pain, orthopnea, PND, swelling in lower extremities, anasarca,                                   dizziness, palpitations Resp:   shortness of breath with exertion or at rest.                productive cough,   non-productive cough, coughing up of blood.               change in color of mucus.  wheezing.   Skin:    rash or lesions. GI: + heartburn, indigestion, +abdominal pain, nausea, vomiting, diarrhea,                 change in bowel habits, loss of appetite GU: dysuria, change in color of urine, no urgency or frequency.   flank pain. MS:   +joint pain, stiffness, decreased range of motion, back pain. Neuro-     nothing unusual Psych:  change in mood or affect.  +depression or anxiety.   memory loss.  OBJ- Physical Exam General- Alert, Oriented, Affect-appropriate, Distress- none acute, + obese Skin- rash-none, lesions- none, excoriation- none Lymphadenopathy- none Head- atraumatic            Eyes- Gross vision intact, PERRLA, conjunctivae and secretions clear            Ears- Hearing, canals-normal            Nose- Clear, no-Septal dev, mucus, polyps, erosion, perforation             Throat- Mallampati IV , mucosa clear , drainage- none, tonsils- atrophic Neck- flexible , trachea midline, no stridor , thyroid nl, carotid no bruit Chest - symmetrical excursion , unlabored           Heart/CV- RRR , no murmur , no gallop  , no rub, nl s1 s2                           -  JVD- none , edema- none, stasis changes- none, varices- none           Lung- +coarse in bases, wheeze- none, cough- none , dullness-none, rub- none           Chest wall-  Abd-  Br/ Gen/ Rectal- Not done, not indicated Extrem- +superficial varices-legs Neuro- grossly intact to observation

## 2022-01-19 ENCOUNTER — Ambulatory Visit: Payer: PPO | Admitting: Internal Medicine

## 2022-01-19 ENCOUNTER — Encounter: Payer: Self-pay | Admitting: Internal Medicine

## 2022-01-19 VITALS — BP 110/62 | HR 69 | Ht 63.0 in | Wt 176.8 lb

## 2022-01-19 DIAGNOSIS — G4733 Obstructive sleep apnea (adult) (pediatric): Secondary | ICD-10-CM | POA: Diagnosis not present

## 2022-01-19 DIAGNOSIS — I2510 Atherosclerotic heart disease of native coronary artery without angina pectoris: Secondary | ICD-10-CM

## 2022-01-19 NOTE — Patient Instructions (Signed)
Order- DME [facility]- please change CPAP to autopap 5-15, continue mask of choice, humidifier, supplies, AirView/ card  Please call if we can help

## 2022-01-21 DIAGNOSIS — G4733 Obstructive sleep apnea (adult) (pediatric): Secondary | ICD-10-CM | POA: Diagnosis not present

## 2022-01-21 DIAGNOSIS — I10 Essential (primary) hypertension: Secondary | ICD-10-CM | POA: Diagnosis not present

## 2022-01-21 DIAGNOSIS — F32A Depression, unspecified: Secondary | ICD-10-CM | POA: Diagnosis not present

## 2022-01-21 DIAGNOSIS — R0683 Snoring: Secondary | ICD-10-CM | POA: Diagnosis not present

## 2022-02-01 DIAGNOSIS — B078 Other viral warts: Secondary | ICD-10-CM | POA: Diagnosis not present

## 2022-02-01 DIAGNOSIS — L905 Scar conditions and fibrosis of skin: Secondary | ICD-10-CM | POA: Diagnosis not present

## 2022-02-01 DIAGNOSIS — Z85828 Personal history of other malignant neoplasm of skin: Secondary | ICD-10-CM | POA: Diagnosis not present

## 2022-02-05 ENCOUNTER — Other Ambulatory Visit: Payer: Self-pay | Admitting: Cardiology

## 2022-02-08 LAB — HM DIABETES EYE EXAM

## 2022-02-13 ENCOUNTER — Encounter: Payer: Self-pay | Admitting: Internal Medicine

## 2022-02-13 NOTE — Assessment & Plan Note (Signed)
Benefits from CPAP with good compliance and control Plan- change to auto range 5-15

## 2022-02-13 NOTE — Assessment & Plan Note (Signed)
Discussed calcification on CT chest She will continuee f/u with cardiology

## 2022-02-19 ENCOUNTER — Encounter: Payer: Self-pay | Admitting: Family Medicine

## 2022-02-21 ENCOUNTER — Ambulatory Visit: Payer: PPO

## 2022-02-21 DIAGNOSIS — G4733 Obstructive sleep apnea (adult) (pediatric): Secondary | ICD-10-CM | POA: Diagnosis not present

## 2022-02-21 DIAGNOSIS — R0683 Snoring: Secondary | ICD-10-CM | POA: Diagnosis not present

## 2022-02-21 DIAGNOSIS — I10 Essential (primary) hypertension: Secondary | ICD-10-CM | POA: Diagnosis not present

## 2022-02-21 DIAGNOSIS — F32A Depression, unspecified: Secondary | ICD-10-CM | POA: Diagnosis not present

## 2022-02-25 ENCOUNTER — Other Ambulatory Visit: Payer: Self-pay | Admitting: Cardiology

## 2022-03-01 ENCOUNTER — Ambulatory Visit (INDEPENDENT_AMBULATORY_CARE_PROVIDER_SITE_OTHER): Payer: PPO

## 2022-03-01 VITALS — Ht 63.0 in | Wt 179.0 lb

## 2022-03-01 DIAGNOSIS — Z0000 Encounter for general adult medical examination without abnormal findings: Secondary | ICD-10-CM | POA: Diagnosis not present

## 2022-03-01 NOTE — Progress Notes (Signed)
Subjective:   [name] [name] is a 71 y.o. female who presents for Medicare Annual (Subsequent) preventive examination.  Review of Systems    Virtual Visit via Telephone Note  I connected with  [name] [name] on 03/01/22 at 10:45 AM EDT by telephone and verified that I am speaking with the correct person using two identifiers.  Location: Patient: Home Provider: Office Persons participating in the virtual visit: patient/Nurse Health Advisor   I discussed the limitations, risks, security and privacy concerns of performing an evaluation and management service by telephone and the availability of in person appointments. The patient expressed understanding and agreed to proceed.  Interactive audio and video telecommunications were attempted between this nurse and patient, however failed, due to patient having technical difficulties OR patient did not have access to video capability.  We continued and completed visit with audio only.  Some vital signs may be absent or patient reported.   [name] Peaches, LPN  Cardiac Risk Factors include: advanced age (>24mn, >>41women);diabetes mellitus;hypertension     Objective:    Today's Vitals   03/01/22 1152  Weight: 179 lb (81.2 kg)  Height: _0  (1.6 m)   Body mass index is 31.71 kg/m.     03/01/2022   11:59 AM 03/17/2020    9:12 AM 02/10/2020    9:04 AM 11/13/2018    9:58 AM 04/19/2017   10:40 AM  Advanced Directives  Does Patient Have a Medical Advance Directive? _1   Would patient like information on creating a medical advance directive? No - Patient declined Yes (MAU/Ambulatory/Procedural Areas - Information given) No - Patient declined No - Patient declined No - Patient declined    Current Medications (verified) Outpatient Encounter Medications as of 03/01/2022  Medication Sig   lubiprostone (AMITIZA) 24 MCG capsule TAKE 1 CAPSULE(24 MCG) BY MOUTH TWICE DAILY WITH A MEAL (Patient not taking: Reported on 01/12/2022)    albuterol (VENTOLIN HFA) 108 (90 Base) MCG/ACT inhaler Inhale 1-2 puffs into the lungs every 6 (six) hours as needed for wheezing or shortness of breath.   aspirin 81 MG tablet Take 81 mg by mouth daily.   blood glucose meter kit and supplies KIT Dispense based on patient and insurance preference. Use up to four times daily as directed. (FOR ICD-9 250.00, 250.01).   buPROPion (WELLBUTRIN XL) 150 MG 24 hr tablet Take 3 tablets (450 mg total) by mouth daily.   carvedilol (COREG) 12.5 MG tablet TAKE 1 AND 1/2 TABLETS(18.75 MG) BY MOUTH TWICE DAILY   Cholecalciferol (VITAMIN D3) 3000 units TABS Take 1 tablet by mouth daily.   cloNIDine (CATAPRES) 0.1 MG tablet TAKE 1 TABLET(0.1 MG) BY MOUTH TWICE DAILY   Dulaglutide (TRULICITY) 06.27MOJ/5.0KXSOPN Inject 0.75 mg into the skin once a week.   fluticasone (FLONASE) 50 MCG/ACT nasal spray Place 2 sprays into both nostrils daily.   glucose blood (ONETOUCH ULTRA) test strip Use as instructed to check blood sugar once a day   hydrALAZINE (APRESOLINE) 100 MG tablet TAKE 1 TABLET(100 MG) BY MOUTH IN THE MORNING AND AT BEDTIME   Lancet Devices (ONETOUCH DELICA PLUS LANCING) MISC Check blood sugar once a day   Lancets (ONETOUCH DELICA PLUS [id number] MISC Check blood sugar once daily   metFORMIN (GLUCOPHAGE-XR) 500 MG 24 hr tablet Take 1 tablet (500 mg total) by mouth daily.   Multiple Vitamins-Minerals (MULTIVITAMIN ADULTS 50+ PO) Take 1 tablet every morning by mouth.    olmesartan-hydrochlorothiazide (BENICAR HCT)  40-12.5 MG tablet TAKE 1 TABLET BY MOUTH DAILY   pantoprazole (PROTONIX) 40 MG tablet TAKE 1 TABLET(40 MG) BY MOUTH EVERY MORNING   polyethylene glycol (MIRALAX / GLYCOLAX) 17 g packet Take 17 g by mouth daily.   Probiotic Product (PROBIOTIC DAILY PO) Take 1 capsule every morning by mouth.    rosuvastatin (CRESTOR) 20 MG tablet TAKE 1 TABLET(20 MG) BY MOUTH DAILY   No facility-administered encounter medications on file as of 03/01/2022.     Allergies (verified) Patient has no known allergies.   History: Past Medical History:  Diagnosis Date   Acquired deformity of right foot    Back pain    CAD (coronary artery disease) primary cardioloigst-  dr Stanford Breed   hx NSTEMI 10-24-2001  s/p  cardiac cath w/ PCI and DES x1 to first diagonal   Chronic constipation    Constipation    COVID-19 06/02/2020   Depression    Fatty liver 04/26/2015   GERD (gastroesophageal reflux disease)    History of abnormal cervical Pap smear    ACUS 2012   History of exercise stress test 05-25-2014  dr Stanford Breed   borderline (indeterminate) with non-specific ST changes (mild ST depression in leads II,III, aVF, & V6, did not meet criteria for ischemia) ;  pt had low intolerence, no cp, normal heartrate and bp response, no arrhythmias   History of goiter    as child s/p  removal ,  per pt benign , neck area  (not thyroid)   History of heart attack    History of non-ST elevation myocardial infarction (NSTEMI) 10/24/2001   s/p  PCI and DES to first diagonal   Hyperlipidemia    Hypertension    Joint pain    OSA on CPAP pulmoloigst-  dr young   per study 11-07-2004  severe osa   Right foot pain    S/P drug eluting coronary stent placement 10/27/2001   x1 to first diagonal    Seasonal and perennial allergic rhinitis    Sleep apnea    SOB (shortness of breath)    Type 2 diabetes mellitus ([place])    Vitamin D deficiency    Wears dentures    upper and lower partial denture   Wears glasses    Past Surgical History:  Procedure Laterality Date   BUNIONECTOMY Left 2012   BUNIONECTOMY Right 04/19/2017   Procedure: RIGHT BUNIONECTOMY;  Surgeon: Rosemary Holms, DPM;  Location: [facility];  Service: Podiatry;  Laterality: Right;   CARDIAC CATHETERIZATION  09-11-2002   dr Lia Foyer   well-preserved LVF; continued patency previouly placed stent in diagonal branch;  mild luminal irregularities    CARDIOVASCULAR STRESS TEST  05-16-2011    dr Stanford Breed   normal nuclear study w/ no evidence ischemia/  normal LV function and wall motion , ef 77%   COLONOSCOPY  06/2021   TAs, diverticulosis, int hem, rpt 3 yrs ([place])   CORONARY ANGIOPLASTY WITH STENT PLACEMENT  10-27-2001   dr Lia Foyer   high-grade stenosis first diagonal post PCI and DES (TAXUS);  mild luminal irregularity throughout LAD, RCA, and very mild CFx;  preserved LVF   goiter removal  1968   per pt benign tumor removed from neck beside throat   HALLUX VALGUS LAPIDUS Right 04/19/2017   Procedure: [place];  Surgeon: Rosemary Holms, DPM;  Location: [place];  Service: Podiatry;  Laterality: Right;   HAMMER TOE SURGERY Right 04/19/2017   Procedure: HAMMER TOE CORRECTION  RIGHT 2ND;  Surgeon: Rosemary Holms, DPM;  Location[facility];  Service: Podiatry;  Laterality: Right;   METATARSAL OSTEOTOMY Right 04/19/2017   Procedure: 2ND METATARSAL OSTEOTOMY;  Surgeon: Rosemary Holms, DPM;  Location: [place];  Service: Podiatry;  Laterality: Right;   TONSILLECTOMY AND ADENOIDECTOMY  age 26   TUBAL LIGATION Bilateral 1985   Family History  Problem Relation Age of Onset   Esophageal cancer Mother    Cancer Mother        throat, cervical   Diabetes Mother    Stroke Mother    Heart attack Mother    Heart disease Mother    Hypertension Mother    Thyroid disease Mother    Alcoholism Mother    Liver disease Mother    Cancer Father        prostate   Diabetes Sister    Heart attack Sister    Hypertension Sister    Hyperlipidemia Sister    Colon cancer Neg Hx    Breast cancer Neg Hx    Colon polyps Neg Hx    Stomach cancer Neg Hx    Rectal cancer Neg Hx    Social History   Socioeconomic History   Marital status: Married    Spouse name: Jeneen Rinks   Number of children: 3   Years of education: Not on file   Highest education level: Not on file  Occupational History   Occupation: Air cabin crew     Comment: credit Radio broadcast assistant    Employer: [employer]   Occupation: Retired  Tobacco Use   Smoking status: Former    Packs/day: 1.50    Years: 48.00    Total pack years: 72.00    Types: Cigarettes    Quit date: 09/23/2012    Years since quitting: 9.4   Smokeless tobacco: Never  Vaping Use   Vaping Use: Never used  Substance and Sexual Activity   Alcohol use: Yes    Alcohol/week: 0.0 standard drinks of alcohol    Comment: occasional wine   Drug use: No   Sexual activity: Not Currently  Other Topics Concern   Not on file  Social History Narrative   Caffeine Use:  2 cups coffee daily   Regular exercise:  No      Social Determinants of Health   Financial Resource Strain: Low Risk  (03/01/2022)   Overall Financial Resource Strain (CARDIA)    Difficulty of Paying Living Expenses: Not hard at all  Food Insecurity: No Food Insecurity (03/01/2022)   Hunger Vital Sign    Worried About Running Out of Food in the Last Year: Never true    Ran Out of Food in the Last Year: Never true  Transportation Needs: No Transportation Needs (03/01/2022)   PRAPARE - Hydrologist (Medical): No    Lack of Transportation (Non-Medical): No  Physical Activity: Insufficiently Active (03/01/2022)   Exercise Vital Sign    Days of Exercise per Week: 3 days    Minutes of Exercise per Session: 20 min  Stress: No Stress Concern Present (03/01/2022)   [place]    Feeling of Stress : Not at all  Social Connections: [place] (03/01/2022)   Social Connection and Isolation Panel [NHANES]    Frequency of Communication with Friends and Family: More than three times a week    Frequency of Social Gatherings with Friends and Family: More than  three times a week    Attends Religious Services: More than 4 times per year    Active Member of Clubs or Organizations: Yes    Attends Programme researcher, broadcasting/film/video: More than 4 times per year    Marital Status: Married    Tobacco Counseling Counseling given: Not Answered   Clinical Intake: Nutrition Risk Assessment:  Has the patient had any N/V/D within the last 2 months?  No  Does the patient have any non-healing wounds?  No  Has the patient had any unintentional weight loss or weight gain?  No   Diabetes:  Is the patient diabetic?  Yes  If diabetic, was a CBG obtained today?  Yes CBG 141 Taken by patient Did the patient bring in their glucometer from home?  No  How often do you monitor your CBG's? Daily.   Financial Strains and Diabetes Management:  Are you having any financial strains with the device, your supplies or your medication? No .  Does the patient want to be seen by Chronic Care Management for management of their diabetes?  No  Would the patient like to be referred to a Nutritionist or for Diabetic Management?  No   Diabetic Exams:  Diabetic Eye Exam: Completed No. Overdue for diabetic eye exam. Pt has been advised about the importance in completing this exam. A referral has been placed today. Message sent to referral coordinator for scheduling purposes. Advised pt to expect a call from office referred to regarding appt.  Diabetic Foot Exam: Completed No. Pt has been advised about the importance in completing this exam. Pt is scheduled for diabetic foot exam on Followed by PCP.   Pre-visit preparation completed: No  Pain : No/denies pain     BMI - recorded: 31.71 Nutritional Status: BMI > 30  Obese Nutritional Risks: None Diabetes: Yes CBG done?: Yes CBG resulted in Enter/ Edit results?: Yes (CBG 141 Taken by patient) Did pt. bring in CBG monitor from home?: No  How often do you need to have someone help you when you read instructions, pamphlets, or other written materials from your doctor or pharmacy?: 1 - Never  Diabetic?  Yes  Interpreter Needed?: No  Information entered by :: Rolene Arbour LPN   Activities of Daily Living    03/01/2022   11:58 AM  In your present state of health, do you have any difficulty performing the following activities:  Hearing? 0  Vision? 0  Difficulty concentrating or making decisions? 0  Walking or climbing stairs? 0  Dressing or bathing? 0  Doing errands, shopping? 0  Preparing Food and eating ? N  Using the Toilet? N  In the past six months, have you accidently leaked urine? N  Do you have problems with loss of bowel control? N  Managing your Medications? N  Managing your Finances? N  Housekeeping or managing your Housekeeping? N    Patient Care Team: Ria Bush, MD as PCP - General (Family Medicine) Stanford Breed Denice Bors, MD as PCP - Cardiology (Cardiology) Stanford Breed Denice Bors, MD as Consulting Physician (Cardiology) Sable Feil, MD as Consulting Physician (Gastroenterology) Azucena Fallen, MD as Consulting Physician (Obstetrics and Gynecology) Charlton Haws, [facility] as Pharmacist (Pharmacist)  Indicate any recent Medical Services you may have received from other than [facility] providers in the past year (date may be approximate).     Assessment:   This is a routine wellness examination for [name] [name].  Hearing/Vision screen Hearing Screening - Comments:: Denies hearing  difficulties   Vision Screening - Comments:: Wears rx glasses - up to date with routine eye exams with  [place] issues and exercise activities discussed: Current Exercise Habits: Home exercise routine, Type of exercise: walking, Time (Minutes): 20, Frequency (Times/Week): 3, Weekly Exercise (Minutes/Week): 60, Intensity: Moderate, Exercise limited by: None identified   Goals Addressed               This Visit's Progress     No current goals (pt-stated)         Depression Screen    03/01/2022   11:56 AM 02/28/2021   12:00 PM 02/02/2021    7:54 AM 07/22/2020    3:53 PM 02/10/2020    9:05 AM 11/13/2018    9:45 AM 07/14/2018    11:31 AM  PHQ 2/9 Scores  PHQ - 2 Score 0 0 3 0 0 0 0  PHQ- 9 Score  7 13  0 0     Fall Risk    03/01/2022   11:59 AM 03/17/2020    9:11 AM 02/10/2020    9:05 AM 01/05/2020    3:46 PM 11/13/2018    9:45 AM  [place] in the past year? 0 0 0 0 0  Comment    [place] Telephone Survey: data to providers prior to load   Number falls in past yr:   0    Injury with Fall?   0    Risk for fall due to :   Medication side effect    Follow up   Falls evaluation completed;Falls prevention discussed      FALL RISK PREVENTION PERTAINING TO THE HOME:  Any stairs in or around the home? Yes  If so, are there any without handrails? No  Home free of loose throw rugs in walkways, pet beds, electrical cords, etc? Yes  Adequate lighting in your home to reduce risk of falls? Yes   ASSISTIVE DEVICES UTILIZED TO PREVENT FALLS:  Life alert? No  Use of a cane, walker or w/c? No  Grab bars in the bathroom? No  Shower chair or bench in shower? No  Elevated toilet seat or a handicapped toilet? No   TIMED UP AND GO:  Was the test performed? No . Audio Visit    Cognitive Function:    02/10/2020    9:37 AM 11/13/2018    9:58 AM  MMSE - Mini Mental State Exam  Orientation to time 5 5  Orientation to Place 5 5  Registration 3 3  Attention/ Calculation 5 0  Recall 3 3  Language- name 2 objects  0  Language- repeat 1 1  Language- follow 3 step command  0  Language- read & follow direction  0  Write a sentence  0  Copy design  0  Total score  17        03/01/2022   11:59 AM  6CIT Screen  What Year? 0 points  What month? 0 points  What time? 0 points  Count back from 20 0 points  Months in reverse 0 points  Repeat phrase 0 points  Total Score 0 points    Immunizations Immunization History  Administered Date(s) Administered   Fluad Quad(high Dose 65+) 02/17/2020, 02/28/2021   Influenza Split 03/05/2012   Influenza, High Dose Seasonal PF 04/25/2015, 02/15/2016, 03/27/2017, 04/09/2018    Influenza,inj,Quad PF,6+ Mos 03/31/2013, 02/03/2014   PFIZER(Purple Top)SARS-COV-2 Vaccination 09/01/2019, 09/22/2019   Pneumococcal Conjugate-13 04/25/2015   Pneumococcal  Polysaccharide-23 05/09/2011, 08/22/2016   Tdap 05/09/2011    TDAP status: Due, Education has been provided regarding the importance of this vaccine. Advised may receive this vaccine at local pharmacy or Health Dept. Aware to provide a copy of the vaccination record if obtained from local pharmacy or Health Dept. Verbalized acceptance and understanding.  Flu Vaccine status: Up to date  Pneumococcal vaccine status: Up to date  Covid-19 vaccine status: Completed vaccines  Qualifies for Shingles Vaccine? Yes   Zostavax completed No   Shingrix Completed?: No.    Education has been provided regarding the importance of this vaccine. Patient has been advised to call insurance company to determine out of pocket expense if they have not yet received this vaccine. Advised may also receive vaccine at local pharmacy or Health Dept. Verbalized acceptance and understanding.  Screening Tests Health Maintenance  Topic Date Due   Diabetic kidney evaluation - Urine ACR  02/09/2021   COVID-19 Vaccine (3 - Pfizer series) 03/17/2022 (Originally 11/17/2019)   Zoster Vaccines- Shingrix (1 of 2) 05/31/2022 (Originally 01/23/2000)   INFLUENZA VACCINE  09/09/2022 (Originally 01/09/2022)   TETANUS/TDAP  03/02/2023 (Originally 05/08/2021)   HEMOGLOBIN A1C  06/02/2022   Diabetic kidney evaluation - GFR measurement  06/29/2022   FOOT EXAM  08/31/2022   OPHTHALMOLOGY EXAM  02/09/2023   MAMMOGRAM  04/04/2023   COLONOSCOPY (Pts 45-28yr Insurance coverage will need to be confirmed)  06/27/2024   Pneumonia Vaccine 72 Years old  Completed   DEXA SCAN  Completed   Hepatitis C Screening  Completed   HPV VACCINES  Aged Out    Health Maintenance  Health Maintenance Due  Topic Date Due   Diabetic kidney evaluation - Urine ACR  02/09/2021     Colorectal cancer screening: Type of screening: Colonoscopy. Completed 06/27/21. Repeat every 3 years  Mammogram status: Completed 04/03/21. Repeat every year  Bone Density status: Completed 07/21/18. Results reflect: Bone density results: OSTEOPOROSIS. Repeat every   years.  Lung Cancer Screening: (Low Dose CT Chest recommended if Age 72-80years, 30 pack-year currently smoking OR have quit w/in 15years.) does not qualify.     Additional Screening:  Hepatitis C Screening: does qualify; Completed 12/29/14  Vision Screening: Recommended annual ophthalmology exams for early detection of glaucoma and other disorders of the eye. Is the patient up to date with their annual eye exam?  Yes  Who is the provider or what is the name of the office in which the patient attends annual [place] exams[place] not established with a provider, would they like to be referred to a provider to establish care? No .   Dental Screening: Recommended annual dental exams for proper oral hygiene  Community Resource Referral / Chronic Care Management:  CRR required this visit?  No   CCM required this visit?  No      Plan:     I have personally reviewed and noted the following in the patient's chart:   Medical and social history Use of alcohol, tobacco or illicit drugs  Current medications and supplements including opioid prescriptions. Patient is not currently taking opioid prescriptions. Functional ability and status Nutritional status Physical activity Advanced directives List of other physicians Hospitalizations, surgeries, and ER visits in previous 12 months Vitals Screenings to include cognitive, depression, and falls Referrals and appointments  In addition, I have reviewed and discussed with patient certain preventive protocols, quality metrics, and best practice recommendations. A written personalized care plan for preventive services  as well as general preventive health  recommendations were provided to patient.     [name] Peaches, LPN   [id number]   Nurse Notes: Patient due Diabetic Kidney evaluation-Urine ACR

## 2022-03-01 NOTE — Patient Instructions (Addendum)
[name] [name] , Thank you for taking time to come for your Medicare Wellness Visit. I appreciate your ongoing commitment to your health goals. Please review the following plan we discussed and let me know if I can assist you in the future.   These are the goals we discussed:  Goals       Achieve a Healthy Weight-Obesity      Timeframe:  Long-Range Goal Priority:  High Start Date: 07/18/21                            Expected End Date:     07/18/22                  Follow Up Date May 2023   - change to whole grain breads, cereal, pasta - drink 6 to 8 glasses of water each day - eat 3 to 5 servings of fruits and vegetables each day - eat 5 or 6 small meals each day - fill half the plate with nonstarchy vegetables - limit fast food meals to no more than 1 per week - read food labels for fat, fiber, carbohydrates and portion size - set a realistic goal - switch to sugar-free drinks    Why is this important?   When you are ready to manage your weight, have a plan and have set a goal, it is time to take action.  Taking small steps to change how you eat and exercise is a good place to start.    Notes:       No current goals (pt-stated)      Patient Stated      Starting 11/13/18, I will continue to take medications as prescribed.       Patient Stated      02/10/2020, I will maintain and continue medications as prescribed.         This is a list of the screening recommended for you and due dates:  Health Maintenance  Topic Date Due   Yearly kidney health urinalysis for diabetes  02/09/2021   COVID-19 Vaccine (3 - Pfizer series) 03/17/2022*   Zoster (Shingles) Vaccine (1 of 2) 05/31/2022*   Flu Shot  09/09/2022*   Tetanus Vaccine  03/02/2023*   Hemoglobin A1C  06/02/2022   Yearly kidney function blood test for diabetes  06/29/2022   Complete foot exam   08/31/2022   Eye exam for diabetics  02/09/2023   Mammogram  04/04/2023   Colon Cancer Screening  06/27/2024   Pneumonia Vaccine   Completed   DEXA scan (bone density measurement)  Completed   Hepatitis C Screening: USPSTF Recommendation to screen - Ages 30-79 yo.  Completed   HPV Vaccine  Aged Out  *Topic was postponed. The date shown is not the original due date.    Advanced directives: Advance directive discussed with you today. Even though you declined this today, please call our office should you change your mind, and we can give you the proper paperwork for you to fill out.   Conditions/risks identified: None  Next appointment: Follow up in one year for your annual wellness visit.    Preventive Care 27 Years and Older, Female  Preventive care refers to lifestyle choices and visits with your health care provider that can promote health and wellness. What does preventive care include? A yearly physical exam. This is also called an annual well check. Dental exams once or twice a year. Routine eye  exams. Ask your health care provider how often you should have your eyes checked. Personal lifestyle choices, including: Daily care of your teeth and gums. Regular physical activity. Eating a healthy diet. Avoiding tobacco and drug use. Limiting alcohol use. Practicing safe sex. Taking low doses of aspirin every day. Taking vitamin and mineral supplements as recommended by your health care provider. What happens during an annual well check? The services and screenings done by your health care provider during your annual well check will depend on your age, overall health, lifestyle risk factors, and family history of disease. Counseling  Your health care provider may ask you questions about your: Alcohol use. Tobacco use. Drug use. Emotional well-being. Home and relationship well-being. Sexual activity. Eating habits. History of falls. Memory and ability to understand (cognition). Work and work Statistician. Screening  You may have the following tests or measurements: Height, weight, and BMI. Blood  pressure. Lipid and cholesterol levels. These may be checked every 5 years, or more frequently if you are over 49 years old. Skin check. Lung cancer screening. You may have this screening every year starting at age 22 if you have a 30-pack-year history of smoking and currently smoke or have quit within the past 15 years. Fecal occult blood test (FOBT) of the stool. You may have this test every year starting at age 58. Flexible sigmoidoscopy or colonoscopy. You may have a sigmoidoscopy every 5 years or a colonoscopy every 10 years starting at age 36. Prostate cancer screening. Recommendations will vary depending on your family history and other risks. Hepatitis C blood test. Hepatitis B blood test. Sexually transmitted disease (STD) testing. Diabetes screening. This is done by checking your blood sugar (glucose) after you have not eaten for a while (fasting). You may have this done every 1-3 years. Abdominal aortic aneurysm (AAA) screening. You may need this if you are a current or former smoker. Osteoporosis. You may be screened starting at age 43 if you are at high risk. Talk with your health care provider about your test results, treatment options, and if necessary, the need for more tests. Vaccines  Your health care provider may recommend certain vaccines, such as: Influenza vaccine. This is recommended every year. Tetanus, diphtheria, and acellular pertussis (Tdap, Td) vaccine. You may need a Td booster every 10 years. Zoster vaccine. You may need this after age 42. Pneumococcal 13-valent conjugate (PCV13) vaccine. One dose is recommended after age 17. Pneumococcal polysaccharide (PPSV23) vaccine. One dose is recommended after age 28. Talk to your health care provider about which screenings and vaccines you need and how often you need them. This information is not intended to replace advice given to you by your health care provider. Make sure you discuss any questions you have with your  health care provider. Document Released: 06/24/2015 Document Revised: 02/15/2016 Document Reviewed: 03/29/2015 Elsevier Interactive Patient Education  2017 [place] Prevention in the Home Falls can cause injuries. They can happen to people of all ages. There are many things you can do to make your home safe and to help prevent falls. What can I do on the outside of my home? Regularly fix the edges of walkways and driveways and fix any cracks. Remove anything that might make you trip as you walk through a door, such as a raised step or threshold. Trim any bushes or trees on the path to your home. Use bright outdoor lighting. Clear any walking paths of anything that might make someone trip, such as  rocks or tools. Regularly check to see if handrails are loose or broken. Make sure that both sides of any steps have handrails. Any raised decks and porches should have guardrails on the edges. Have any leaves, snow, or ice cleared regularly. Use sand or salt on walking paths during winter. Clean up any spills in your garage right away. This includes oil or grease spills. What can I do in the bathroom? Use night lights. Install grab bars by the toilet and in the tub and shower. Do not use towel bars as grab bars. Use non-skid mats or decals in the tub or shower. If you need to sit down in the shower, use a plastic, non-slip stool. Keep the floor dry. Clean up any water that spills on the floor as soon as it happens. Remove soap buildup in the tub or shower regularly. Attach bath mats securely with double-sided non-slip rug tape. Do not have throw rugs and other things on the floor that can make you trip. What can I do in the bedroom? Use night lights. Make sure that you have a light by your bed that is easy to reach. Do not use any sheets or blankets that are too big for your bed. They should not hang down onto the floor. Have a firm chair that has side arms. You can use this for  support while you get dressed. Do not have throw rugs and other things on the floor that can make you trip. What can I do in the kitchen? Clean up any spills right away. Avoid walking on wet floors. Keep items that you use a lot in easy-to-reach places. If you need to reach something above you, use a strong step stool that has a grab bar. Keep electrical cords out of the way. Do not use floor polish or wax that makes floors slippery. If you must use wax, use non-skid floor wax. Do not have throw rugs and other things on the floor that can make you trip. What can I do with my stairs? Do not leave any items on the stairs. Make sure that there are handrails on both sides of the stairs and use them. Fix handrails that are broken or loose. Make sure that handrails are as long as the stairways. Check any carpeting to make sure that it is firmly attached to the stairs. Fix any carpet that is loose or worn. Avoid having throw rugs at the top or bottom of the stairs. If you do have throw rugs, attach them to the floor with carpet tape. Make sure that you have a light switch at the top of the stairs and the bottom of the stairs. If you do not have them, ask someone to add them for you. What else can I do to help prevent falls? Wear shoes that: Do not have high heels. Have rubber bottoms. Are comfortable and fit you well. Are closed at the toe. Do not wear sandals. If you use a stepladder: Make sure that it is fully opened. Do not climb a closed stepladder. Make sure that both sides of the stepladder are locked into place. Ask someone to hold it for you, if possible. Clearly mark and make sure that you can see: Any grab bars or handrails. First and last steps. Where the edge of each step is. Use tools that help you move around (mobility aids) if they are needed. These include: Canes. Walkers. Scooters. Crutches. Turn on the lights when you go into a dark area.  Replace any light bulbs as soon  as they burn out. Set up your furniture so you have a clear path. Avoid moving your furniture around. If any of your floors are uneven, fix them. If there are any pets around you, be aware of where they are. Review your medicines with your doctor. Some medicines can make you feel dizzy. This can increase your chance of falling. Ask your doctor what other things that you can do to help prevent falls. This information is not intended to replace advice given to you by your health care provider. Make sure you discuss any questions you have with your health care provider. Document Released: 03/24/2009 Document Revised: 11/03/2015 Document Reviewed: 07/02/2014 Elsevier Interactive Patient Education  2017 [employer].

## 2022-03-05 ENCOUNTER — Encounter: Payer: Self-pay | Admitting: Cardiology

## 2022-03-05 ENCOUNTER — Encounter: Payer: Self-pay | Admitting: Family Medicine

## 2022-03-05 ENCOUNTER — Ambulatory Visit (INDEPENDENT_AMBULATORY_CARE_PROVIDER_SITE_OTHER): Payer: PPO | Admitting: Family Medicine

## 2022-03-05 VITALS — BP 126/64 | HR 62 | Temp 97.9°F | Ht 62.0 in | Wt 184.2 lb

## 2022-03-05 DIAGNOSIS — Z87891 Personal history of nicotine dependence: Secondary | ICD-10-CM

## 2022-03-05 DIAGNOSIS — K219 Gastro-esophageal reflux disease without esophagitis: Secondary | ICD-10-CM

## 2022-03-05 DIAGNOSIS — E785 Hyperlipidemia, unspecified: Secondary | ICD-10-CM

## 2022-03-05 DIAGNOSIS — I10 Essential (primary) hypertension: Secondary | ICD-10-CM

## 2022-03-05 DIAGNOSIS — R809 Proteinuria, unspecified: Secondary | ICD-10-CM | POA: Diagnosis not present

## 2022-03-05 DIAGNOSIS — E118 Type 2 diabetes mellitus with unspecified complications: Secondary | ICD-10-CM

## 2022-03-05 DIAGNOSIS — I2510 Atherosclerotic heart disease of native coronary artery without angina pectoris: Secondary | ICD-10-CM

## 2022-03-05 DIAGNOSIS — Z0000 Encounter for general adult medical examination without abnormal findings: Secondary | ICD-10-CM | POA: Diagnosis not present

## 2022-03-05 DIAGNOSIS — F331 Major depressive disorder, recurrent, moderate: Secondary | ICD-10-CM

## 2022-03-05 DIAGNOSIS — E1169 Type 2 diabetes mellitus with other specified complication: Secondary | ICD-10-CM

## 2022-03-05 DIAGNOSIS — G4733 Obstructive sleep apnea (adult) (pediatric): Secondary | ICD-10-CM | POA: Diagnosis not present

## 2022-03-05 DIAGNOSIS — E66811 Obesity, class 1: Secondary | ICD-10-CM

## 2022-03-05 DIAGNOSIS — Z7189 Other specified counseling: Secondary | ICD-10-CM

## 2022-03-05 DIAGNOSIS — E669 Obesity, unspecified: Secondary | ICD-10-CM

## 2022-03-05 DIAGNOSIS — K5909 Other constipation: Secondary | ICD-10-CM

## 2022-03-05 DIAGNOSIS — K760 Fatty (change of) liver, not elsewhere classified: Secondary | ICD-10-CM

## 2022-03-05 DIAGNOSIS — Z23 Encounter for immunization: Secondary | ICD-10-CM | POA: Diagnosis not present

## 2022-03-05 DIAGNOSIS — E559 Vitamin D deficiency, unspecified: Secondary | ICD-10-CM | POA: Diagnosis not present

## 2022-03-05 LAB — LIPID PANEL
Cholesterol: 112 mg/dL (ref 0–200)
HDL: 48 mg/dL (ref >39.00)
LDL Cholesterol: 34 mg/dL (ref 0–99)
NonHDL: 63.95
Total CHOL/HDL Ratio: 2
Triglycerides: 152 mg/dL — ABNORMAL HIGH (ref 0.0–149.0)
VLDL: 30.4 mg/dL (ref 0.0–40.0)

## 2022-03-05 LAB — COMPREHENSIVE METABOLIC PANEL
ALT: 19 U/L (ref 0–35)
AST: 15 U/L (ref 0–37)
Albumin: 4 g/dL (ref 3.5–5.2)
Alkaline Phosphatase: 51 U/L (ref 39–117)
BUN: 16 mg/dL (ref 6–23)
CO2: 28 mEq/L (ref 19–32)
Calcium: 9.1 mg/dL (ref 8.4–10.5)
Chloride: 105 mEq/L (ref 96–112)
Creatinine, Ser: 0.75 mg/dL (ref 0.40–1.20)
GFR: 79.71 mL/min (ref >60.00)
Glucose, Bld: 124 mg/dL — ABNORMAL HIGH (ref 70–99)
Potassium: 3.6 mEq/L (ref 3.5–5.1)
Sodium: 142 mEq/L (ref 135–145)
Total Bilirubin: 0.6 mg/dL (ref 0.2–1.2)
Total Protein: 6.6 g/dL (ref 6.0–8.3)

## 2022-03-05 LAB — MICROALBUMIN / CREATININE URINE RATIO
Creatinine,U: 59.4 mg/dL
Microalb Creat Ratio: 3.8 mg/g (ref 0.0–30.0)
Microalb, Ur: 2.3 mg/dL — ABNORMAL HIGH (ref 0.0–1.9)

## 2022-03-05 LAB — VITAMIN D 25 HYDROXY (VIT D DEFICIENCY, FRACTURES): VITD: 70.45 ng/mL (ref 30.00–100.00)

## 2022-03-05 NOTE — Patient Instructions (Addendum)
Flu shot today  Pass by lab for blood work.  If interested, check pharmacy about new 2 shot shingles series (shingrix).  Call podiatry for an appointment, let us know if you need a referral.  Good to see you today  Return as needed or in 6 months for diabetes follow up.   Health Maintenance After Age 72 After age 40, you are at a higher risk for certain long-term diseases and infections as well as injuries from falls. Falls are a major cause of broken bones and head injuries in people who are older than age 56. Getting regular preventive care can help to keep you healthy and well. Preventive care includes getting regular testing and making lifestyle changes as recommended by your health care provider. Talk with your health care provider about: Which screenings and tests you should have. A screening is a test that checks for a disease when you have no symptoms. A diet and exercise plan that is right for you. What should I know about screenings and tests to prevent falls? Screening and testing are the best ways to find a health problem early. Early diagnosis and treatment give you the best chance of managing medical conditions that are common after age 41. Certain conditions and lifestyle choices may make you more likely to have a fall. Your health care provider may recommend: Regular vision checks. Poor vision and conditions such as cataracts can make you more likely to have a fall. If you wear glasses, make sure to get your prescription updated if your vision changes. Medicine review. Work with your health care provider to regularly review all of the medicines you are taking, including over-the-counter medicines. Ask your health care provider about any side effects that may make you more likely to have a fall. Tell your health care provider if any medicines that you take make you feel dizzy or sleepy. Strength and balance checks. Your health care provider may recommend certain tests to check your  strength and balance while standing, walking, or changing positions. Foot health exam. Foot pain and numbness, as well as not wearing proper footwear, can make you more likely to have a fall. Screenings, including: Osteoporosis screening. Osteoporosis is a condition that causes the bones to get weaker and break more easily. Blood pressure screening. Blood pressure changes and medicines to control blood pressure can make you feel dizzy. Depression screening. You may be more likely to have a fall if you have a fear of falling, feel depressed, or feel unable to do activities that you used to do. Alcohol use screening. Using too much alcohol can affect your balance and may make you more likely to have a fall. Follow these instructions at home: Lifestyle Do not drink alcohol if: Your health care provider tells you not to drink. If you drink alcohol: Limit how much you have to: 0-1 drink a day for women. 0-2 drinks a day for men. Know how much alcohol is in your drink. In the [place] one drink equals one 12 oz bottle of beer (355 mL), one 5 oz glass of wine (148 mL), or one 1 oz glass of hard liquor (44 mL). Do not use any products that contain nicotine or tobacco. These products include cigarettes, chewing tobacco, and vaping devices, such as e-cigarettes. If you need help quitting, ask your health care provider. Activity  Follow a regular exercise program to stay fit. This will help you maintain your balance. Ask your health care provider what types of exercise are  appropriate for you. If you need a cane or walker, use it as recommended by your health care provider. Wear supportive shoes that have nonskid soles. Safety  Remove any tripping hazards, such as rugs, cords, and clutter. Install safety equipment such as grab bars in bathrooms and safety rails on stairs. Keep rooms and walkways well-lit. General instructions Talk with your health care provider about your risks for falling. Tell your  health care provider if: You fall. Be sure to tell your health care provider about all falls, even ones that seem minor. You feel dizzy, tiredness (fatigue), or off-balance. Take over-the-counter and prescription medicines only as told by your health care provider. These include supplements. Eat a healthy diet and maintain a healthy weight. A healthy diet includes low-fat dairy products, low-fat (lean) meats, and fiber from whole grains, beans, and lots of fruits and vegetables. Stay current with your vaccines. Schedule regular health, dental, and eye exams. Summary Having a healthy lifestyle and getting preventive care can help to protect your health and wellness after age 71. Screening and testing are the best way to find a health problem early and help you avoid having a fall. Early diagnosis and treatment give you the best chance for managing medical conditions that are more common for people who are older than age 72. Falls are a major cause of broken bones and head injuries in people who are older than age 39. Take precautions to prevent a fall at home. Work with your health care provider to learn what changes you can make to improve your health and wellness and to prevent falls. This information is not intended to replace advice given to you by your health care provider. Make sure you discuss any questions you have with your health care provider. Document Revised: 10/17/2020 Document Reviewed: 10/17/2020 Elsevier Patient Education  [place].

## 2022-03-05 NOTE — Assessment & Plan Note (Signed)
Chronic, stable on crestor '20mg'$  daily. Sees cardiology.  The ASCVD Risk score (Arnett DK, et al., 2019) failed to calculate for the following reasons:   The valid total cholesterol range is 130 to 320 mg/dL

## 2022-03-05 NOTE — Assessment & Plan Note (Signed)
Continues seeing psychiatry

## 2022-03-05 NOTE — Progress Notes (Signed)
Patient ID: Kiante Petrovich, female    DOB: 04/20/1951, 71 y.o.   MRN: [id number]  This visit was conducted in person.  BP 126/64   Pulse 62   Temp 97.9 F (36.6 C) (Temporal)   Ht _0  (1.575 m)   Wt 184 lb 4 oz (83.6 kg)   BMI 33.70 kg/m    CC: CPE Subjective:   HPI: Laparis Durrett is a 71 y.o. female presenting on 03/05/2022 for Annual Exam (MCR prt 2. )   Saw health advisor last week for medicare wellness visit. Note reviewed.  No results found.  Flowsheet Row Clinical Support from 03/01/2022 in [facility]  PHQ-2 Total Score 0          03/01/2022   11:59 AM 03/17/2020    9:11 AM 02/10/2020    9:05 AM 01/05/2020    3:46 PM 11/13/2018    9:45 AM  Fall Risk   Falls in the past year? 0 0 0 0 0  Comment    Emmi Telephone Survey: data to providers prior to load   Number falls in past yr:   0    Injury with Fall?   0    Risk for fall due to :   Medication side effect    Follow up   Falls evaluation completed;Falls prevention discussed      [facility] previously [facility].  Due for labs today   OSA - manage by pulm   DM - managed with metformin XR 525m daily as well as trulicity 08.25OIweekly. Possible slight improvement in gassiness.   ?IBS/CIC - started amitiza 274m bid with benefit. This has helped bowels but still goes just once every 3 days. She's stopped miralax /probiotic.   Preventative: Colonoscopy - HP rpt 10 yrs (PSharlett Iles12/2012  Colonoscopy 06/2021 - 3 TAs, diverticulosis, in hem , rpt 3 yrs ([place]orenso [place] Br[place]0/2022 Well woman exam - yearly through OBGYN Dr [facility]  DEXA scan - WNL 07/2018 Lung cancer screening - continues yearly, latest 12/2021. Imaging showed severe 3v CAC, aortic ATH, and emphysema. Sees cardiology Dr CrStanford Breed Flu shot - yearly [place]/20[place], 09/2019, no booster Tdap 2012  Prevnar-13 2016, Pneumovax-23  2012, 2018 Shingrix - discussed, not interested. Has not had chicken pox  Advanced directive discussion - does not have at home - packet provided. Daughter StMargarette Canada[place] be HCPOA.  Seat belt use discussed Sunscreen use discussed. [place] B[place].  Dentist - q6 mo  Eye exam - yearly  Ex smoker - quit 2015. 40+ PY hx.  Alcohol  - rare  Bowel - constipation better with amitiza Bladder -  no incontinence      Relevant past medical, surgical, family and social history reviewed and updated as indicated. Interim medical history since our last visit reviewed. Allergies and medications reviewed and updated. Outpatient Medications Prior to Visit  Medication Sig Dispense Refill   albuterol (VENTOLIN HFA) 108 (90 Base) MCG/ACT inhaler Inhale 1-2 puffs into the lungs every 6 (six) hours as needed for wheezing or shortness of breath. 1 each 0   aspirin 81 MG tablet Take 81 mg by mouth daily.     blood glucose meter kit and supplies KIT Dispense based on patient and insurance preference. Use up to four times daily as directed. (FOR ICD-9 250.00,  250.01). 1 each 0   buPROPion (WELLBUTRIN XL) 150 MG 24 hr tablet Take 3 tablets (450 mg total) by mouth daily. 270 tablet 3   carvedilol (COREG) 12.5 MG tablet TAKE 1 AND 1/2 TABLETS(18.75 MG) BY MOUTH TWICE DAILY 270 tablet 3   Cholecalciferol (VITAMIN D3) 3000 units TABS Take 1 tablet by mouth daily. 30 tablet    cloNIDine (CATAPRES) 0.1 MG tablet TAKE 1 TABLET(0.1 MG) BY MOUTH TWICE DAILY 180 tablet 3   Dulaglutide (TRULICITY) 4.48 JE/5.6DJ SOPN Inject 0.75 mg into the skin once a week. 2 mL 11   fluticasone (FLONASE) 50 MCG/ACT nasal spray Place 2 sprays into both nostrils daily. 16 g 3   glucose blood (ONETOUCH ULTRA) test strip Use as instructed to check blood sugar once a day 100 strip 3   hydrALAZINE (APRESOLINE) 100 MG tablet TAKE 1 TABLET(100 MG) BY MOUTH IN THE MORNING AND AT BEDTIME 180 tablet 1   Lancet Devices  (ONETOUCH DELICA PLUS LANCING) MISC Check blood sugar once a day 1 each 0   Lancets (ONETOUCH DELICA PLUS [id number]) MISC Check blood sugar once daily 100 each 3   lubiprostone (AMITIZA) 24 MCG capsule TAKE 1 CAPSULE(24 MCG) BY MOUTH TWICE DAILY WITH A MEAL 180 capsule 0   metFORMIN (GLUCOPHAGE-XR) 500 MG 24 hr tablet Take 1 tablet (500 mg total) by mouth daily. 90 tablet 1   Multiple Vitamins-Minerals (MULTIVITAMIN ADULTS 50+ PO) Take 1 tablet every morning by mouth.      olmesartan-hydrochlorothiazide (BENICAR HCT) 40-12.5 MG tablet TAKE 1 TABLET BY MOUTH DAILY 90 tablet 3   pantoprazole (PROTONIX) 40 MG tablet TAKE 1 TABLET(40 MG) BY MOUTH EVERY MORNING 90 tablet 3   rosuvastatin (CRESTOR) 20 MG tablet TAKE 1 TABLET(20 MG) BY MOUTH DAILY 90 tablet 1   polyethylene glycol (MIRALAX / GLYCOLAX) 17 g packet Take 17 g by mouth daily. (Patient not taking: Reported on 03/05/2022)     Probiotic Product (PROBIOTIC DAILY PO) Take 1 capsule every morning by mouth.  (Patient not taking: Reported on 03/05/2022)     No facility-administered medications prior to visit.     Per HPI unless specifically indicated in ROS section below Review of Systems  Constitutional:  Positive for chills. Negative for activity change, appetite change, fatigue, fever and unexpected weight change.  HENT:  Negative for hearing loss.   Eyes:  Negative for visual disturbance.  Respiratory:  Positive for cough (allergy/sinus). Negative for chest tightness, shortness of breath and wheezing.   Cardiovascular:  Negative for chest pain, palpitations and leg swelling.  Gastrointestinal:  Positive for constipation. Negative for abdominal distention, abdominal pain, blood in stool, diarrhea, nausea and vomiting.  Endocrine: Positive for cold intolerance.  Genitourinary:  Negative for difficulty urinating and hematuria.  Musculoskeletal:  Negative for arthralgias, myalgias and neck pain.  Skin:  Negative for rash.  Neurological:   Positive for headaches (occ). Negative for dizziness, seizures and syncope.  Hematological:  Negative for adenopathy. Does not bruise/bleed easily.  Psychiatric/Behavioral:  Negative for dysphoric mood. The patient is not nervous/anxious.     Objective:  BP 126/64   Pulse 62   Temp 97.9 F (36.6 C) (Temporal)   Ht _0  (1.575 m)   Wt 184 lb 4 oz (83.6 kg)   BMI 33.70 kg/m   Wt Readings from Last 3 Encounters:  03/05/22 184 lb 4 oz (83.6 kg)  03/01/22 179 lb (81.2 kg)  01/19/22 176 lb 12.8 oz (80.2 kg)  Physical Exam Vitals and nursing note reviewed.  Constitutional:      Appearance: Normal appearance. She is obese. She is not ill-appearing.  HENT:     Head: Normocephalic and atraumatic.     Right Ear: Tympanic membrane, ear canal and external ear normal. There is no impacted cerumen.     Left Ear: Tympanic membrane, ear canal and external ear normal. There is no impacted cerumen.  Eyes:     General:        Right eye: No discharge.        Left eye: No discharge.     Extraocular Movements: Extraocular movements intact.     Conjunctiva/sclera: Conjunctivae normal.     Pupils: Pupils are equal, round, and reactive to light.  Neck:     Thyroid: No thyroid mass or thyromegaly.     Vascular: No carotid bruit.  Cardiovascular:     Rate and Rhythm: Normal rate and regular rhythm.     Pulses: Normal pulses.     Heart sounds: Normal heart sounds. No murmur heard. Pulmonary:     Effort: Pulmonary effort is normal. No respiratory distress.     Breath sounds: Normal breath sounds. No wheezing, rhonchi or rales.  Abdominal:     General: Bowel sounds are normal. There is no distension.     Palpations: Abdomen is soft. There is no mass.     Tenderness: There is no abdominal tenderness. There is no guarding or rebound.     Hernia: No hernia is present.  Musculoskeletal:     Cervical back: Normal range of motion and neck supple. No rigidity.     Right lower leg: No edema.      Left lower leg: No edema.  Lymphadenopathy:     Cervical: No cervical adenopathy.  Skin:    General: Skin is warm and dry.     Findings: No rash.  Neurological:     General: No focal deficit present.     Mental Status: She is alert. Mental status is at baseline.  Psychiatric:        Mood and Affect: Mood normal.        Behavior: Behavior normal.       Results for orders placed or performed in visit on 02/19/22  HM DIABETES EYE EXAM  Result Value Ref Range   HM Diabetic Eye Exam No Retinopathy No Retinopathy    Assessment & Plan:   Problem List Items Addressed This Visit     Health maintenance examination - Primary (Chronic)    Preventative protocols reviewed and updated unless pt declined. Discussed healthy diet and lifestyle.       Advanced directives, counseling/discussion (Chronic)    Advanced directive discussion - does not have at home - packet provided today. Daughter Margarette [place] would be HCPOA.       Controlled diabetes mellitus type 2 with complications (HCC)    Chronic, overall stable - update A1c.       Relevant Orders   Hemoglobin A1c   Microalbumin / creatinine urine ratio   Hyperlipidemia associated with type 2 diabetes mellitus (HCC)    Chronic, stable on crestor $RemoveBe'20mg'kUmbpdxJk$  daily. Sees cardiology.  The ASCVD Risk score (Arnett DK, et al., 2019) failed to calculate for the following reasons:   The valid total cholesterol range is 130 to 320 mg/dL       Relevant Orders   Lipid panel   Comprehensive metabolic panel   MDD (major depressive disorder), recurrent episode,  moderate ([place])    Continues seeing psychiatry       OBSTRUCTIVE SLEEP APNEA    Stable period followed by pulmonology.       Essential hypertension, benign    Chronic, stable. Continue current regimen.       Coronary atherosclerosis    Regularly sees cardiology       GERD    Continue daily pantoprazole.       Microalbuminuria    Update urine test.      Obesity, Class  I, BMI 30-34.9   NAFLD (nonalcoholic fatty liver disease)    Update LFTs       Vitamin D deficiency    She is taking 3000 IU daily - update levels.       Relevant Orders   VITAMIN D 25 Hydroxy (Vit-D Deficiency, Fractures)   Chronic constipation    Doing some better since starting amitiza - continue this. Also discussed restarting miralax, colace.  Linzess was unaffordable.       Ex-smoker    Quit 2015 - remains abstinent.       Other Visit Diagnoses     Need for influenza vaccination       Relevant Orders   Flu Vaccine QUAD High Dose(Fluad) (Completed)        No orders of the defined types were placed in this encounter.  Orders Placed This Encounter  Procedures   Flu Vaccine QUAD High Dose(Fluad)   Lipid panel   Comprehensive metabolic panel   Hemoglobin A1c   VITAMIN D 25 Hydroxy (Vit-D Deficiency, Fractures)   Microalbumin / creatinine urine ratio    Patient instructions: Flu shot today  Pass by lab for blood work.  If interested, check with pharmacy about new 2 shot shingles series (shingrix).  Call podiatry for an appointment, let us know if you need a referral.  Good to see you today  Return as needed or in 6 months for diabetes follow up.   Follow up plan: Return in about 6 months (around 09/03/2022) for follow up visit.  Ria Bush, MD

## 2022-03-05 NOTE — Assessment & Plan Note (Signed)
Advanced directive discussion - does not have at home - packet provided today. Daughter Margarette [place] would be HCPOA.

## 2022-03-05 NOTE — Assessment & Plan Note (Signed)
She is taking 3000 IU daily - update levels.

## 2022-03-05 NOTE — Assessment & Plan Note (Signed)
Quit 2015 - remains abstinent.

## 2022-03-05 NOTE — Assessment & Plan Note (Signed)
Preventative protocols reviewed and updated unless pt declined. Discussed healthy diet and lifestyle.  

## 2022-03-05 NOTE — Assessment & Plan Note (Signed)
Regularly sees cardiology 

## 2022-03-05 NOTE — Assessment & Plan Note (Signed)
Doing some better since starting amitiza - continue this. Also discussed restarting miralax, colace.  Linzess was unaffordable.

## 2022-03-05 NOTE — Assessment & Plan Note (Signed)
Update LFT's 

## 2022-03-05 NOTE — Assessment & Plan Note (Signed)
Chronic, overall stable - update A1c.

## 2022-03-05 NOTE — Assessment & Plan Note (Signed)
Chronic, stable. Continue current regimen. 

## 2022-03-05 NOTE — Assessment & Plan Note (Signed)
Continue daily pantoprazole.  °

## 2022-03-05 NOTE — Assessment & Plan Note (Signed)
Stable period followed by pulmonology.

## 2022-03-05 NOTE — Assessment & Plan Note (Addendum)
Update urine test.

## 2022-03-06 LAB — HEMOGLOBIN A1C: Hgb A1c MFr Bld: 6.2 % (ref 4.6–6.5)

## 2022-03-07 ENCOUNTER — Encounter: Payer: Self-pay | Admitting: Family Medicine

## 2022-03-07 DIAGNOSIS — R5382 Chronic fatigue, unspecified: Secondary | ICD-10-CM

## 2022-03-13 ENCOUNTER — Ambulatory Visit: Payer: PPO | Admitting: Podiatry

## 2022-03-13 DIAGNOSIS — M79674 Pain in right toe(s): Secondary | ICD-10-CM

## 2022-03-13 DIAGNOSIS — B351 Tinea unguium: Secondary | ICD-10-CM

## 2022-03-13 DIAGNOSIS — M79675 Pain in left toe(s): Secondary | ICD-10-CM

## 2022-03-13 NOTE — Progress Notes (Signed)
Chief Complaint  Patient presents with   Nail Problem    Patient is her for right foot great toe that she hit years ago and now it is thick and hard and funky looking.Patient is diabetic.    SUBJECTIVE Patient presents to office today complaining of elongated, thickened nails that cause pain while ambulating in shoes.  Patient is unable to trim their own nails. Patient is here for further evaluation and treatment.  Past Medical History:  Diagnosis Date   Acquired deformity of right foot    Back pain    CAD (coronary artery disease) primary cardioloigst-  dr Stanford Breed   hx NSTEMI 10-24-2001  s/p  cardiac cath w/ PCI and DES x1 to first diagonal   Chronic constipation    Constipation    COVID-19 06/02/2020   Depression    Fatty liver 04/26/2015   GERD (gastroesophageal reflux disease)    History of abnormal cervical Pap smear    ACUS 2012   History of exercise stress test 05-25-2014  dr Stanford Breed   borderline (indeterminate) with non-specific ST changes (mild ST depression in leads II,III, aVF, & V6, did not meet criteria for ischemia) ;  pt had low intolerence, no cp, normal heartrate and bp response, no arrhythmias   History of goiter    as child s/p  removal ,  per pt benign , neck area  (not thyroid)   History of heart attack    History of non-ST elevation myocardial infarction (NSTEMI) 10/24/2001   s/p  PCI and DES to first diagonal   Hyperlipidemia    Hypertension    Joint pain    OSA on CPAP pulmoloigst-  dr young   per study 11-07-2004  severe osa   Right foot pain    S/P drug eluting coronary stent placement 10/27/2001   x1 to first diagonal    Seasonal and perennial allergic rhinitis    Sleep apnea    SOB (shortness of breath)    Type 2 diabetes mellitus ([place])    Vitamin D deficiency    Wears dentures    upper and lower partial denture   Wears glasses     No Known Allergies   OBJECTIVE General Patient is awake, alert, and oriented x 3 and in no acute  distress. Derm Skin is dry and supple bilateral. Negative open lesions or macerations. Remaining integument unremarkable. Nails are tender, long, thickened and dystrophic with subungual debris, consistent with onychomycosis, 1-5 bilateral. No signs of infection noted. Vasc  DP and PT pedal pulses palpable bilaterally. Temperature gradient within normal limits.  Neuro Epicritic and protective threshold sensation grossly intact bilaterally.  Musculoskeletal Exam No symptomatic pedal deformities noted bilateral. Muscular strength within normal limits.  ASSESSMENT 1.  Pain due to onychomycosis of toenails both  PLAN OF CARE 1. Patient evaluated today.  2. Instructed to maintain good pedal hygiene and foot care.  3. Mechanical debridement of nails 1-5 bilaterally performed using a nail nipper. Filed with dremel without incident.  4.  OTC formula 3 antifungal topical was dispensed at checkout.  Apply daily  5.  Return to clinic in 3 mos.    Edrick Kins, DPM [facility]  Dr. Edrick Kins, DPM    [address]  [place], [place] [address]                Office [phone]  Fax [phone]

## 2022-03-14 ENCOUNTER — Other Ambulatory Visit: Payer: Self-pay

## 2022-03-14 MED ORDER — PANTOPRAZOLE SODIUM 40 MG PO TBEC: DELAYED_RELEASE_TABLET | ORAL | 3 refills | Status: DC

## 2022-03-16 ENCOUNTER — Other Ambulatory Visit: Payer: Self-pay | Admitting: Obstetrics & Gynecology

## 2022-03-16 DIAGNOSIS — Z1231 Encounter for screening mammogram for malignant neoplasm of breast: Secondary | ICD-10-CM

## 2022-03-23 DIAGNOSIS — F32A Depression, unspecified: Secondary | ICD-10-CM | POA: Diagnosis not present

## 2022-03-23 DIAGNOSIS — G4733 Obstructive sleep apnea (adult) (pediatric): Secondary | ICD-10-CM | POA: Diagnosis not present

## 2022-03-23 DIAGNOSIS — I10 Essential (primary) hypertension: Secondary | ICD-10-CM | POA: Diagnosis not present

## 2022-03-23 DIAGNOSIS — R0683 Snoring: Secondary | ICD-10-CM | POA: Diagnosis not present

## 2022-04-02 ENCOUNTER — Other Ambulatory Visit: Payer: Self-pay | Admitting: Obstetrics & Gynecology

## 2022-04-02 DIAGNOSIS — Z124 Encounter for screening for malignant neoplasm of cervix: Secondary | ICD-10-CM | POA: Diagnosis not present

## 2022-04-02 DIAGNOSIS — Z01419 Encounter for gynecological examination (general) (routine) without abnormal findings: Secondary | ICD-10-CM | POA: Diagnosis not present

## 2022-04-02 DIAGNOSIS — N951 Menopausal and female climacteric states: Secondary | ICD-10-CM | POA: Diagnosis not present

## 2022-04-02 DIAGNOSIS — Z6832 Body mass index (BMI) 32.0-32.9, adult: Secondary | ICD-10-CM | POA: Diagnosis not present

## 2022-04-02 DIAGNOSIS — Z01411 Encounter for gynecological examination (general) (routine) with abnormal findings: Secondary | ICD-10-CM | POA: Diagnosis not present

## 2022-04-02 DIAGNOSIS — R39198 Other difficulties with micturition: Secondary | ICD-10-CM | POA: Diagnosis not present

## 2022-04-02 DIAGNOSIS — Z0142 Encounter for cervical smear to confirm findings of recent normal smear following initial abnormal smear: Secondary | ICD-10-CM | POA: Diagnosis not present

## 2022-04-02 DIAGNOSIS — Z780 Asymptomatic menopausal state: Secondary | ICD-10-CM

## 2022-04-04 DIAGNOSIS — G4733 Obstructive sleep apnea (adult) (pediatric): Secondary | ICD-10-CM | POA: Diagnosis not present

## 2022-04-04 DIAGNOSIS — I10 Essential (primary) hypertension: Secondary | ICD-10-CM | POA: Diagnosis not present

## 2022-04-05 ENCOUNTER — Other Ambulatory Visit: Payer: PPO

## 2022-04-06 ENCOUNTER — Ambulatory Visit: Payer: Self-pay

## 2022-04-06 ENCOUNTER — Other Ambulatory Visit (INDEPENDENT_AMBULATORY_CARE_PROVIDER_SITE_OTHER): Payer: PPO

## 2022-04-06 ENCOUNTER — Telehealth: Payer: Self-pay

## 2022-04-06 DIAGNOSIS — R5382 Chronic fatigue, unspecified: Secondary | ICD-10-CM

## 2022-04-06 LAB — CBC WITH DIFFERENTIAL/PLATELET
Basophils Absolute: 0 10*3/uL (ref 0.0–0.1)
Basophils Relative: 0.5 % (ref 0.0–3.0)
Eosinophils Absolute: 0.1 10*3/uL (ref 0.0–0.7)
Eosinophils Relative: 3.3 % (ref 0.0–5.0)
HCT: 33.3 % — ABNORMAL LOW (ref 36.0–46.0)
Hemoglobin: 10.9 g/dL — ABNORMAL LOW (ref 12.0–15.0)
Lymphocytes Relative: 27.5 % (ref 12.0–46.0)
Lymphs Abs: 1.2 10*3/uL (ref 0.7–4.0)
MCHC: 32.8 g/dL (ref 30.0–36.0)
MCV: 82.9 fl (ref 78.0–100.0)
Monocytes Absolute: 0.3 10*3/uL (ref 0.1–1.0)
Monocytes Relative: 7.6 % (ref 3.0–12.0)
Neutro Abs: 2.7 10*3/uL (ref 1.4–7.7)
Neutrophils Relative %: 61.1 % (ref 43.0–77.0)
Platelets: 141 10*3/uL — ABNORMAL LOW (ref 150.0–400.0)
RBC: 4.01 Mil/uL (ref 3.87–5.11)
RDW: 16.9 % — ABNORMAL HIGH (ref 11.5–15.5)
WBC: 4.4 10*3/uL (ref 4.0–10.5)

## 2022-04-06 LAB — TSH: TSH: 0.89 u[IU]/mL (ref 0.35–5.50)

## 2022-04-06 LAB — VITAMIN B12: Vitamin B-12: 661 pg/mL (ref 211–911)
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
## 2022-04-06 NOTE — Telephone Encounter (Signed)
Would offer appt at 4pm with me if she's able to come in.

## 2022-04-06 NOTE — Telephone Encounter (Signed)
Dr. Synthia Innocent 4:00 slot has already been used.  Rena scheduled pt an appt at [facility] 04/06/22 at 5 PM.

## 2022-04-10 ENCOUNTER — Other Ambulatory Visit: Payer: Self-pay | Admitting: Family Medicine

## 2022-04-10 DIAGNOSIS — D649 Anemia, unspecified: Secondary | ICD-10-CM | POA: Insufficient documentation

## 2022-04-10 NOTE — Telephone Encounter (Signed)
See lab result section. I don't see where she went to [facility]. Please schedule OV for evaluation.

## 2022-04-10 NOTE — Telephone Encounter (Signed)
Patient notified as instructed by telephone and verbalized understanding.Lab results given to patient.  Patient stated that she is feeling some better today. Patient stated that she has had some lightheadedness that comes and goes. Patient stated at times she feels a little foggy and takes Tylenol which helps. Patient stated that she had cold like symptoms last week and took two covid test which were negative. Patient scheduled for an office visit tomorrow 04/11/22 at 12:00 noon with Dr. Danise Mina. Patient was given ER precautions and she verbalized understanding.

## 2022-04-11 ENCOUNTER — Encounter: Payer: Self-pay | Admitting: Family Medicine

## 2022-04-11 ENCOUNTER — Ambulatory Visit (INDEPENDENT_AMBULATORY_CARE_PROVIDER_SITE_OTHER): Payer: PPO | Admitting: Family Medicine

## 2022-04-11 VITALS — BP 122/60 | HR 70 | Temp 97.6°F | Ht 62.0 in | Wt 181.5 lb

## 2022-04-11 DIAGNOSIS — E118 Type 2 diabetes mellitus with unspecified complications: Secondary | ICD-10-CM

## 2022-04-11 DIAGNOSIS — R0989 Other specified symptoms and signs involving the circulatory and respiratory systems: Secondary | ICD-10-CM

## 2022-04-11 DIAGNOSIS — R42 Dizziness and giddiness: Secondary | ICD-10-CM

## 2022-04-11 DIAGNOSIS — D649 Anemia, unspecified: Secondary | ICD-10-CM | POA: Diagnosis not present

## 2022-04-11 DIAGNOSIS — F331 Major depressive disorder, recurrent, moderate: Secondary | ICD-10-CM

## 2022-04-11 DIAGNOSIS — I10 Essential (primary) hypertension: Secondary | ICD-10-CM

## 2022-04-11 LAB — POC URINALSYSI DIPSTICK (AUTOMATED)
Bilirubin, UA: NEGATIVE
Blood, UA: NEGATIVE
Glucose, UA: NEGATIVE
Ketones, UA: NEGATIVE
Leukocytes, UA: NEGATIVE
Nitrite, UA: NEGATIVE
Protein, UA: NEGATIVE
Spec Grav, UA: 1.025 (ref 1.010–1.025)
Urobilinogen, UA: 0.2 E.U./dL
pH, UA: 6 (ref 5.0–8.0)

## 2022-04-11 LAB — IBC PANEL
Iron: 73 ug/dL (ref 42–145)
Saturation Ratios: 19.5 % — ABNORMAL LOW (ref 20.0–50.0)
TIBC: 373.8 ug/dL (ref 250.0–450.0)
Transferrin: 267 mg/dL (ref 212.0–360.0)

## 2022-04-11 LAB — FERRITIN: Ferritin: 15.4 ng/mL (ref 10.0–291.0)

## 2022-04-11 MED ORDER — METFORMIN HCL ER 500 MG PO TB24: 500.0000 mg | ORAL_TABLET | ORAL | 3 refills | Status: DC
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
## 2022-04-11 NOTE — Patient Instructions (Addendum)
Labs today  Pass by lab for stool test.  We will check urine test today as well.  We will order neck artery ultrasound.  Take pantoprazole 30 min before lunch.  Drop metformin XR to every other day.

## 2022-04-14 ENCOUNTER — Other Ambulatory Visit: Payer: Self-pay | Admitting: Family Medicine

## 2022-04-14 DIAGNOSIS — D649 Anemia, unspecified: Secondary | ICD-10-CM

## 2022-04-14 DIAGNOSIS — R42 Dizziness and giddiness: Secondary | ICD-10-CM | POA: Insufficient documentation

## 2022-04-14 DIAGNOSIS — R0989 Other specified symptoms and signs involving the circulatory and respiratory systems: Secondary | ICD-10-CM | POA: Insufficient documentation

## 2022-04-14 DIAGNOSIS — R5382 Chronic fatigue, unspecified: Secondary | ICD-10-CM | POA: Insufficient documentation

## 2022-04-14 NOTE — Assessment & Plan Note (Addendum)
Newly noted R carotid bruit - ?referred from cardiac murmur.  Check carotid US, if normal, consider updated echocardiogram (last 2020 without significant valvular disease)

## 2022-04-14 NOTE — Assessment & Plan Note (Addendum)
She is on 5 drug regimen through cardiology to control blood pressures and they are stable. Orthostatics negative today. Consider dropping BP med some if ongoing symptoms.

## 2022-04-14 NOTE — Assessment & Plan Note (Addendum)
Nonspecific lightheadedness, brain fog, fatigue.  Nonfocal neurological exam.  No obvious cognitive issue identified.  New anemia - could contribute to these symptoms. See below for further evaluation.

## 2022-04-14 NOTE — Assessment & Plan Note (Signed)
Chronic, great control on low dose metformin XR. Will drop dosing to QOD.

## 2022-04-14 NOTE — Assessment & Plan Note (Addendum)
Newly noted normocytic anemia of unclear cause. UA normal, iFOB pending. B12 level normal when recently checked. ?contributing to current symptoms of lightheadedness and fatigue.  Check iron panel.

## 2022-04-14 NOTE — Assessment & Plan Note (Signed)
Chronic, stable period on long term wellbutrin XL '450mg'$  daily. Followed by psychiatry.

## 2022-04-18 ENCOUNTER — Ambulatory Visit (HOSPITAL_COMMUNITY)
Admission: RE | Admit: 2022-04-18 | Discharge: 2022-04-18 | Disposition: A | Discharge status: Home / Self Care | Payer: PPO | Source: Ambulatory Visit | Attending: Family Medicine | Admitting: Family Medicine

## 2022-04-18 DIAGNOSIS — R0989 Other specified symptoms and signs involving the circulatory and respiratory systems: Secondary | ICD-10-CM | POA: Diagnosis not present

## 2022-04-19 ENCOUNTER — Ambulatory Visit
Admission: RE | Admit: 2022-04-19 | Discharge: 2022-04-19 | Disposition: A | Discharge status: Home / Self Care | Payer: PPO | Source: Ambulatory Visit | Attending: Obstetrics & Gynecology | Admitting: Obstetrics & Gynecology

## 2022-04-19 DIAGNOSIS — Z1231 Encounter for screening mammogram for malignant neoplasm of breast: Secondary | ICD-10-CM | POA: Diagnosis not present

## 2022-04-20 ENCOUNTER — Encounter: Payer: Self-pay | Admitting: Family Medicine

## 2022-04-20 ENCOUNTER — Other Ambulatory Visit (INDEPENDENT_AMBULATORY_CARE_PROVIDER_SITE_OTHER): Payer: PPO

## 2022-04-20 ENCOUNTER — Other Ambulatory Visit: Payer: Self-pay | Admitting: Family Medicine

## 2022-04-20 DIAGNOSIS — R011 Cardiac murmur, unspecified: Secondary | ICD-10-CM

## 2022-04-20 DIAGNOSIS — D649 Anemia, unspecified: Secondary | ICD-10-CM

## 2022-04-20 LAB — FECAL OCCULT BLOOD, IMMUNOCHEMICAL: Fecal Occult Bld: NEGATIVE

## 2022-04-23 DIAGNOSIS — R0683 Snoring: Secondary | ICD-10-CM | POA: Diagnosis not present

## 2022-04-23 DIAGNOSIS — F32A Depression, unspecified: Secondary | ICD-10-CM | POA: Diagnosis not present

## 2022-04-23 DIAGNOSIS — I10 Essential (primary) hypertension: Secondary | ICD-10-CM | POA: Diagnosis not present

## 2022-04-23 DIAGNOSIS — G4733 Obstructive sleep apnea (adult) (pediatric): Secondary | ICD-10-CM | POA: Diagnosis not present

## 2022-05-07 ENCOUNTER — Other Ambulatory Visit: Payer: Self-pay | Admitting: Family Medicine

## 2022-05-07 ENCOUNTER — Other Ambulatory Visit: Payer: Self-pay | Admitting: Cardiology

## 2022-05-07 DIAGNOSIS — E7800 Pure hypercholesterolemia, unspecified: Secondary | ICD-10-CM

## 2022-05-07 MED ORDER — ALBUTEROL SULFATE HFA 108 (90 BASE) MCG/ACT IN AERS: 1.0000 | INHALATION_SPRAY | Freq: Four times a day (QID) | RESPIRATORY_TRACT | 0 refills | Status: AC | PRN

## 2022-05-17 ENCOUNTER — Ambulatory Visit: Payer: PPO | Admitting: Psychiatry

## 2022-05-17 ENCOUNTER — Encounter: Payer: Self-pay | Admitting: Psychiatry

## 2022-05-17 ENCOUNTER — Other Ambulatory Visit (HOSPITAL_COMMUNITY): Payer: PPO

## 2022-05-17 DIAGNOSIS — F423 Hoarding disorder: Secondary | ICD-10-CM

## 2022-05-17 DIAGNOSIS — F3342 Major depressive disorder, recurrent, in full remission: Secondary | ICD-10-CM

## 2022-05-17 MED ORDER — BUPROPION HCL ER (XL) 150 MG PO TB24: 450.0000 mg | ORAL_TABLET | Freq: Every day | ORAL | 3 refills | Status: DC

## 2022-05-17 NOTE — Progress Notes (Signed)
[name] [name] [id number] 29-Aug-1950 71 y.o.  Subjective:   Patient ID:  [name] [name] is a 71 y.o. (DOB Jan 02, 1951) female.  Chief Complaint:  Chief Complaint  Patient presents with   Follow-up   Depression    Depression        Associated symptoms include decreased concentration.  Associated symptoms include no suicidal ideas.  [name] [name] presents to the office today for follow-up of depression and med change.   When seen December. Recommended NAC  And stop naltrexone to see if it was causing GI problems.  Rec counseling for hoarding but she didn't go.  seen June 2020 & 05/2019.  No meds were changed.  Only taking Wellbutrin XL 450 mg daily from here.  Following noted: Some therapy with  [facility] about hoarding but it never got followed up on it.  He doesn't respond to phone calls or emails.  Did accomplish some with hoarding bc no anxiety with it but he helped her get some of it done. Retirement date Mar 15, 2018 and used to it now.  All these years she's neglected her house but doesn't do anything about it.  Some hoarding.  Doesn't like to stay at home.  No where to put anything in the house bc the closets are full.  Can't get motivated to do anything about it.  Thinks it's motivation and uncertainty over what to do with things.  Won't let people visit bc she's embarrassed.  Doesn't like the house.  H won't move and won't help get rid of things.  05/17/20 appt with following noted: Still only on Wellbutrin 450 mg daily. No SE. Retired and [employer] keeping her busy, sitting 2-3 times per week.   Hard adjustment with retirement initially resolved.  Not depressed. Plan: [name] [name]'s depression is controlled and she is satisfied with the medications, but wonders about decreasing it.  OK to reduce Welllbutrin to 300 mg daily.  05/17/2021 appointment with the following noted: Didn't work to reduce Wellbutrin to 300 mg daily with more depression and irritability. D noticed too.  It's  amazing how it works with more Wellbutrin to 450 mg daily. Picks on H too much if not taking Wellbutrin.   Plan: Failed attempt to reduce Welllbutrin to 300 mg daily, therefore continue 450 mg AM.  05/17/22 appt noted: Continues Wellbutrin 450 am as only psych med. Gets a fog in her head.  During the day but wears off as the day progresses. But is not every day  Sx present for months. Asks if it could be SE.   Normal labs and thyroid and carotid arteries.   Pleased with Wellbutrin.  Mind would go to the wrong places if on less. Sleep good with retirement. 8 grandkids from 3 kids.    Patient reports stable mood and denies depressed or irritable moods.  Patient denies any recent difficulty with anxiety.  Patient denies difficulty with sleep initiation or maintenance. Denies appetite disturbance.  Patient reports that energy has been good.  Motivation to clean is not good. Patient denies any difficulty with concentration.  Patient denies any suicidal ideation.   Past Psychiatric Medication Trials: Wellbutrin XL 450 + Vraylar 1.5, Abilify 10,  Xanax She has been on Wellbutrin from this practice since 2004 Naltrexone NAC GI  Review of Systems:  Review of Systems  Cardiovascular:  Negative for palpitations.  Musculoskeletal:  Positive for arthralgias and back pain.  Neurological:  Negative for tremors.  Psychiatric/Behavioral:  Positive for decreased concentration.  Negative for agitation, behavioral problems, confusion, dysphoric mood, hallucinations, self-injury, sleep disturbance and suicidal ideas. The patient is not nervous/anxious and is not hyperactive.     Medications: I have reviewed the patient's current medications.  Current Outpatient Medications  Medication Sig Dispense Refill   albuterol (VENTOLIN HFA) 108 (90 Base) MCG/ACT inhaler Inhale 1-2 puffs into the lungs every 6 (six) hours as needed for wheezing or shortness of breath. 1 each 0   aspirin 81 MG tablet Take 81 mg by  mouth daily.     blood glucose meter kit and supplies KIT Dispense based on patient and insurance preference. Use up to four times daily as directed. (FOR ICD-9 250.00, 250.01). 1 each 0   carvedilol (COREG) 12.5 MG tablet TAKE 1 AND 1/2 TABLETS(18.75 MG) BY MOUTH TWICE DAILY 270 tablet 3   Cholecalciferol (VITAMIN D3) 3000 units TABS Take 1 tablet by mouth daily. 30 tablet    cloNIDine (CATAPRES) 0.1 MG tablet TAKE 1 TABLET(0.1 MG) BY MOUTH TWICE DAILY 180 tablet 3   Dulaglutide (TRULICITY) 6.28 BT/5.1VO SOPN Inject 0.75 mg into the skin once a week. 2 mL 11   fluticasone (FLONASE) 50 MCG/ACT nasal spray Place 2 sprays into both nostrils daily. 16 g 3   glucose blood (ONETOUCH ULTRA) test strip Use as instructed to check blood sugar once a day 100 strip 3   hydrALAZINE (APRESOLINE) 100 MG tablet TAKE 1 TABLET(100 MG) BY MOUTH IN THE MORNING AND AT BEDTIME 180 tablet 1   Lancet Devices (ONETOUCH DELICA PLUS LANCING) MISC Check blood sugar once a day 1 each 0   Lancets (ONETOUCH DELICA PLUS HYWVPX10G) MISC Check blood sugar once daily 100 each 3   lubiprostone (AMITIZA) 24 MCG capsule TAKE 1 CAPSULE(24 MCG) BY MOUTH TWICE DAILY WITH A MEAL 180 capsule 0   metFORMIN (GLUCOPHAGE-XR) 500 MG 24 hr tablet Take 1 tablet (500 mg total) by mouth every other day. 45 tablet 3   Multiple Vitamins-Minerals (MULTIVITAMIN ADULTS 50+ PO) Take 1 tablet every morning by mouth.      olmesartan-hydrochlorothiazide (BENICAR HCT) 40-12.5 MG tablet TAKE 1 TABLET BY MOUTH DAILY 90 tablet 1   pantoprazole (PROTONIX) 40 MG tablet TAKE 1 TABLET(40 MG) BY MOUTH EVERY MORNING 90 tablet 3   polyethylene glycol (MIRALAX / GLYCOLAX) 17 g packet Take 17 g by mouth daily.     Probiotic Product (PROBIOTIC DAILY PO) Take 1 capsule by mouth every morning.     rosuvastatin (CRESTOR) 20 MG tablet TAKE 1 TABLET(20 MG) BY MOUTH DAILY 90 tablet 1   buPROPion (WELLBUTRIN XL) 150 MG 24 hr tablet Take 3 tablets (450 mg total) by mouth daily.  270 tablet 3   No current facility-administered medications for this visit.    Medication Side Effects: None  Allergies: No Known Allergies  Past Medical History:  Diagnosis Date   Acquired deformity of right foot    Back pain    CAD (coronary artery disease) primary cardioloigst-  dr Stanford Breed   hx NSTEMI 10-24-2001  s/p  cardiac cath w/ PCI and DES x1 to first diagonal   Chronic constipation    Constipation    COVID-19 06/02/2020   Depression    Fatty liver 04/26/2015   GERD (gastroesophageal reflux disease)    History of abnormal cervical Pap smear    ACUS 2012   History of exercise stress test 05-25-2014  dr Stanford Breed   borderline (indeterminate) with non-specific ST changes (mild ST depression in leads II,III, aVF, &  V6, did not meet criteria for ischemia) ;  pt had low intolerence, no cp, normal heartrate and bp response, no arrhythmias   History of goiter    as child s/p  removal ,  per pt benign , neck area  (not thyroid)   History of heart attack    History of non-ST elevation myocardial infarction (NSTEMI) 10/24/2001   s/p  PCI and DES to first diagonal   Hyperlipidemia    Hypertension    Joint pain    OSA on CPAP pulmoloigst-  dr young   per study 11-07-2004  severe osa   Right foot pain    S/P drug eluting coronary stent placement 10/27/2001   x1 to first diagonal    Seasonal and perennial allergic rhinitis    Sleep apnea    SOB (shortness of breath)    Type 2 diabetes mellitus ([place])    Vitamin D deficiency    Wears dentures    upper and lower partial denture   Wears glasses     Family History  Problem Relation Age of Onset   Esophageal cancer Mother    Cancer Mother        throat, cervical   Diabetes Mother    Stroke Mother    Heart attack Mother    Heart disease Mother    Hypertension Mother    Thyroid disease Mother    Alcoholism Mother    Liver disease Mother    Cancer Father        prostate   Diabetes Sister    Heart attack Sister     Hypertension Sister    Hyperlipidemia Sister    Colon cancer Neg Hx    Breast cancer Neg Hx    Colon polyps Neg Hx    Stomach cancer Neg Hx    Rectal cancer Neg Hx     Social History   Socioeconomic History   Marital status: Married    Spouse name: Jeneen Rinks   Number of children: 3   Years of education: Not on file   Highest education level: Not on file  Occupational History   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    Employer: [employer]   Occupation: Retired  Tobacco Use   Smoking status: Former    Packs/day: 1.50    Years: 48.00    Total pack years: 72.00    Types: Cigarettes    Quit date: 09/23/2012    Years since quitting: 9.6   Smokeless tobacco: Never  Vaping Use   Vaping Use: Never used  Substance and Sexual Activity   Alcohol use: Yes    Alcohol/week: 0.0 standard drinks of alcohol    Comment: occasional wine   Drug use: No   Sexual activity: Not Currently  Other Topics Concern   Not on file  Social History Narrative   Caffeine Use:  2 cups coffee daily   Regular exercise:  No      Social Determinants of Health   Financial Resource Strain: Low Risk  (03/01/2022)   Overall Financial Resource Strain (CARDIA)    Difficulty of Paying Living Expenses: Not hard at all  Food Insecurity: No Food Insecurity (03/01/2022)   Hunger Vital Sign    Worried About Running Out of Food in the Last Year: Never true    Ran Out of Food in the Last Year: Never true  Transportation Needs: No Transportation Needs (03/01/2022)   PRAPARE - Transportation  Lack of Transportation (Medical): No    Lack of Transportation (Non-Medical): No  Physical Activity: Insufficiently Active (03/01/2022)   Exercise Vital Sign    Days of Exercise per Week: 3 days    Minutes of Exercise per Session: 20 min  Stress: No Stress Concern Present (03/01/2022)   [place]    Feeling of Stress :  Not at all  Social Connections: [place] (03/01/2022)   Social Connection and Isolation Panel [NHANES]    Frequency of Communication with Friends and Family: More than three times a week    Frequency of Social Gatherings with Friends and Family: More than three times a week    Attends Religious Services: More than 4 times per year    Active Member of [employer] or Organizations: Yes    Attends Archivist Meetings: More than 4 times per year    Marital Status: Married  Human resources officer Violence: Not At Risk (03/01/2022)   Humiliation, Afraid, Rape, and Kick questionnaire    Fear of Current or Ex-Partner: No    Emotionally Abused: No    Physically Abused: No    Sexually Abused: No    Past Medical History, Surgical history, Social history, and Family history were reviewed and updated as appropriate.   Please see review of systems for further details on the patient's review from today.   Objective:   Physical Exam:  There were no vitals taken for this visit.  Physical Exam Constitutional:      General: She is not in acute distress.    Appearance: She is well-developed. She is obese.  Musculoskeletal:        General: No deformity.  Neurological:     Mental Status: She is alert and oriented to person, place, and time.     Motor: No tremor.     Coordination: Coordination normal.     Gait: Gait normal.  Psychiatric:        Attention and Perception: She is attentive.        Mood and Affect: Mood is not anxious or depressed. Affect is not labile, blunt, angry, tearful or inappropriate.        Speech: Speech normal.        Behavior: Behavior normal.        Thought Content: Thought content normal. Thought content is not paranoid. Thought content does not include homicidal or suicidal ideation.        Judgment: Judgment normal.     Comments: Insight intact. No auditory or visual hallucinations. No delusions.       Lab Review:     Component Value Date/Time   NA  142 03/05/2022 1144   NA 145 (H) 06/29/2021 1103   K 3.6 03/05/2022 1144   CL 105 03/05/2022 1144   CO2 28 03/05/2022 1144   GLUCOSE 124 (H) 03/05/2022 1144   BUN 16 03/05/2022 1144   BUN 16 06/29/2021 1103   CREATININE 0.75 03/05/2022 1144   CREATININE 0.70 12/15/2014 1949   CALCIUM 9.1 03/05/2022 1144   PROT 6.6 03/05/2022 1144   PROT 6.8 06/29/2021 1103   ALBUMIN 4.0 03/05/2022 1144   ALBUMIN 4.4 06/29/2021 1103   AST 15 03/05/2022 1144   ALT 19 03/05/2022 1144   ALKPHOS 51 03/05/2022 1144   BILITOT 0.6 03/05/2022 1144   BILITOT 0.5 06/29/2021 1103   GFRNONAA 80 06/04/2019 1038   GFRNONAA 80 10/27/2013 0910   GFRAA 93 06/04/2019 1038  GFRAA >89 10/27/2013 0910       Component Value Date/Time   WBC 4.4 04/06/2022 0847   RBC 4.01 04/06/2022 0847   HGB 10.9 (L) 04/06/2022 0847   HGB 13.3 09/05/2016 1202   HCT 33.3 (L) 04/06/2022 0847   HCT 42.1 09/05/2016 1202   PLT 141.0 (L) 04/06/2022 0847   MCV 82.9 04/06/2022 0847   MCV 89 09/05/2016 1202   MCH 28.0 09/05/2016 1202   MCH 29.2 05/09/2011 0931   MCHC 32.8 04/06/2022 0847   RDW 16.9 (H) 04/06/2022 0847   RDW 15.7 (H) 09/05/2016 1202   LYMPHSABS 1.2 04/06/2022 0847   LYMPHSABS 1.7 09/05/2016 1202   MONOABS 0.3 04/06/2022 0847   EOSABS 0.1 04/06/2022 0847   EOSABS 0.1 09/05/2016 1202   BASOSABS 0.0 04/06/2022 0847   BASOSABS 0.0 09/05/2016 1202    No results found for: "POCLITH", "LITHIUM"   No results found for: "PHENYTOIN", "PHENOBARB", "VALPROATE", "CBMZ"   .res Assessment: Plan:    Recurrent major depression in full remission ([place]) - Plan: buPROPion (WELLBUTRIN XL) 150 MG 24 hr tablet  Hoarding disorder with excessive acquisition   [name] [name]'s depression is controlled and she is satisfied with the medications,  Failed attempt to reduce Welllbutrin to 300 mg daily, therefore continue 450 mg AM. Disc risk relapse.  Problems solving around hoarding.  Needs activity to help structure her time.  Then  needs strategies to deal with the hoarding, problem solved around that issue.    Disc proper sleep amounts.  Feels better if doesn't oversleep.  Disc possible causes of fogginess in early part of the day.  Most likely clonidine, but could be Wellbutrin and could try splitting the dose.  15-minute appointment  FU 12 mos.  Lynder Parents, MD, DFAPA  Please see After Visit Summary for patient specific instructions.  Future Appointments  Date Time Provider [place]  05/17/2022 11:30 AM Cottle, Billey Co., MD [facility] None  05/21/2022 12:50 PM [facility] ECHO 2 [id number]  06/15/2022 11:45 AM Edrick Kins, DPM [facility]  07/18/2022  3:00 PM [facility] CCM PHARMACIST [facility]  10/01/2022 11:00 AM [facility] DX DEXA 1 [facility]  01/22/2023 10:30 AM Deneise Lever, MD [facility] None  03/05/2023 10:15 AM [facility] NURSE HEALTH ADVISOR [facility]    No orders of the defined types were placed in this encounter.     -------------------------------

## 2022-05-21 ENCOUNTER — Ambulatory Visit (HOSPITAL_COMMUNITY): Payer: PPO | Attending: Family Medicine

## 2022-05-21 DIAGNOSIS — R011 Cardiac murmur, unspecified: Secondary | ICD-10-CM | POA: Diagnosis not present

## 2022-05-21 DIAGNOSIS — I10 Essential (primary) hypertension: Secondary | ICD-10-CM | POA: Insufficient documentation

## 2022-05-21 DIAGNOSIS — E785 Hyperlipidemia, unspecified: Secondary | ICD-10-CM | POA: Insufficient documentation

## 2022-05-21 DIAGNOSIS — I2510 Atherosclerotic heart disease of native coronary artery without angina pectoris: Secondary | ICD-10-CM | POA: Insufficient documentation

## 2022-05-21 LAB — ECHOCARDIOGRAM COMPLETE
Area-P 1/2: 3.03 cm2
S' Lateral: 2.9 cm

## 2022-05-23 ENCOUNTER — Encounter: Payer: Self-pay | Admitting: Family Medicine

## 2022-05-23 ENCOUNTER — Other Ambulatory Visit: Payer: Self-pay | Admitting: Family Medicine

## 2022-05-23 DIAGNOSIS — R0683 Snoring: Secondary | ICD-10-CM | POA: Diagnosis not present

## 2022-05-23 DIAGNOSIS — I10 Essential (primary) hypertension: Secondary | ICD-10-CM | POA: Diagnosis not present

## 2022-05-23 DIAGNOSIS — F32A Depression, unspecified: Secondary | ICD-10-CM | POA: Diagnosis not present

## 2022-05-23 DIAGNOSIS — G4733 Obstructive sleep apnea (adult) (pediatric): Secondary | ICD-10-CM | POA: Diagnosis not present

## 2022-05-23 MED ORDER — IRON (FERROUS SULFATE) 325 (65 FE) MG PO TABS: 325.0000 mg | ORAL_TABLET | ORAL | Status: AC

## 2022-05-24 NOTE — Telephone Encounter (Signed)
Replied via echo result note section

## 2022-05-28 ENCOUNTER — Ambulatory Visit
Admission: RE | Admit: 2022-05-28 | Discharge: 2022-05-28 | Disposition: A | Discharge status: Home / Self Care | Payer: PPO | Source: Ambulatory Visit | Attending: Family Medicine | Admitting: Family Medicine

## 2022-05-28 VITALS — BP 150/84 | HR 65 | Temp 98.1°F | Resp 18

## 2022-05-28 DIAGNOSIS — B309 Viral conjunctivitis, unspecified: Secondary | ICD-10-CM | POA: Diagnosis not present

## 2022-05-28 NOTE — ED Provider Notes (Signed)
[name] [name]    CSN: [id number] Arrival date & time: 05/28/22  1603      History   Chief Complaint Chief Complaint  Patient presents with   Eye Problem    Possible pink eye - Entered by patient    HPI [name] [name] is a 71 y.o. female.    Eye Problem   Presents to urgent care with complaint of bilateral eye itching and redness.  She says she has been using antibiotic ointment that was borrowed from her daughter.  She states the itching had gone away but after using the ointment it started to come back.  She denies purulent discharge.  Only watery discharge.  She states symptoms started with left eye redness and clear discharge and then she started scratching and rubbing the eye.  Now with bilateral eye redness and itching.  Past Medical History:  Diagnosis Date   Acquired deformity of right foot    Back pain    CAD (coronary artery disease) primary cardioloigst-  dr Stanford Breed   hx NSTEMI 10-24-2001  s/p  cardiac cath w/ PCI and DES x1 to first diagonal   Chronic constipation    Constipation    COVID-19 06/02/2020   Depression    Fatty liver 04/26/2015   GERD (gastroesophageal reflux disease)    History of abnormal cervical Pap smear    ACUS 2012   History of exercise stress test 05-25-2014  dr Stanford Breed   borderline (indeterminate) with non-specific ST changes (mild ST depression in leads II,III, aVF, & V6, did not meet criteria for ischemia) ;  pt had low intolerence, no cp, normal heartrate and bp response, no arrhythmias   History of goiter    as child s/p  removal ,  per pt benign , neck area  (not thyroid)   History of heart attack    History of non-ST elevation myocardial infarction (NSTEMI) 10/24/2001   s/p  PCI and DES to first diagonal   Hyperlipidemia    Hypertension    Joint pain    OSA on CPAP pulmoloigst-  dr young   per study 11-07-2004  severe osa   Right foot pain    S/P drug eluting coronary stent placement 10/27/2001   x1 to first  diagonal    Seasonal and perennial allergic rhinitis    Sleep apnea    SOB (shortness of breath)    Type 2 diabetes mellitus ([place])    Vitamin D deficiency    Wears dentures    upper and lower partial denture   Wears glasses     Patient Active Problem List   Diagnosis Date Noted   Systolic murmur 41/66/0630   Bruit of right carotid artery 04/14/2022   Lightheadedness 04/14/2022   Anemia 04/10/2022   Medicare annual wellness visit, subsequent 02/28/2021   Advanced directives, counseling/discussion 02/28/2021   Left leg swelling 02/28/2021   Ex-smoker 11/09/2020   Tongue coating 10/21/2020   Bowel habit changes 07/23/2020   Primary osteoarthritis of both first carpometacarpal joints 05/01/2018   Chronic constipation 01/17/2017   NAFLD (nonalcoholic fatty liver disease) 09/20/2016   Vitamin D deficiency 09/20/2016   Obesity, Class I, BMI 30-34.9 09/05/2016   Left carpal tunnel syndrome 08/18/2015   Intertrigo 05/28/2012   Microalbuminuria 11/29/2011   Internal and external hemorrhoids without complication 16/06/930   Benign neoplasm of colon 05/30/2011   Health maintenance examination 05/09/2011   Essential hypertension, benign 01/12/2009   Controlled diabetes mellitus type 2 with complications ([place])  01/11/2007   Hyperlipidemia associated with type 2 diabetes mellitus ([place]) 01/11/2007   MDD (major depressive disorder), recurrent episode, moderate ([place]) 01/11/2007   OBSTRUCTIVE SLEEP APNEA 01/11/2007   Coronary atherosclerosis 01/11/2007   Seasonal and perennial allergic rhinitis 01/11/2007   GERD 01/11/2007    Past Surgical History:  Procedure Laterality Date   BUNIONECTOMY Left 2012   BUNIONECTOMY Right 04/19/2017   Procedure: RIGHT BUNIONECTOMY;  Surgeon: Rosemary Holms, DPM;  Location: [place];  Service: Podiatry;  Laterality: Right;   CARDIAC CATHETERIZATION  09-11-2002   dr Lia Foyer   well-preserved LVF; continued patency previouly placed stent  in diagonal branch;  mild luminal irregularities    CARDIOVASCULAR STRESS TEST  05-16-2011   dr Stanford Breed   normal nuclear study w/ no evidence ischemia/  normal LV function and wall motion , ef 77%   COLONOSCOPY  06/2021   TAs, diverticulosis, int hem, rpt 3 yrs ([place])   CORONARY ANGIOPLASTY WITH STENT PLACEMENT  10-27-2001   dr Lia Foyer   high-grade stenosis first diagonal post PCI and DES (TAXUS);  mild luminal irregularity throughout LAD, RCA, and very mild CFx;  preserved LVF   goiter removal  1968   per pt benign tumor removed from neck beside throat   HALLUX VALGUS LAPIDUS Right 04/19/2017   Procedure: [place];  Surgeon: Rosemary Holms, DPM;  Location: [place];  Service: Podiatry;  Laterality: Right;   HAMMER TOE SURGERY Right 04/19/2017   Procedure: HAMMER TOE CORRECTION RIGHT 2ND;  Surgeon: Rosemary Holms, DPM;  Location: [facility];  Service: Podiatry;  Laterality: Right;   METATARSAL OSTEOTOMY Right 04/19/2017   Procedure: 2ND METATARSAL OSTEOTOMY;  Surgeon: Rosemary Holms, DPM;  Location: [place];  Service: Podiatry;  Laterality: Right;   TONSILLECTOMY AND ADENOIDECTOMY  age 65   TUBAL LIGATION Bilateral 1985    OB History     Gravida  3   Para  3   Term  3   Preterm      AB      Living  3      SAB      IAB      Ectopic      Multiple      Live Births               Home Medications    Prior to Admission medications   Medication Sig Start Date End Date Taking? Authorizing Provider  Iron, Ferrous Sulfate, 325 (65 Fe) MG TABS Take 325 mg by mouth every Monday, Wednesday, and Friday. 05/23/22   Ria Bush, MD  albuterol (VENTOLIN HFA) 108 (90 Base) MCG/ACT inhaler Inhale 1-2 puffs into the lungs every 6 (six) hours as needed for wheezing or shortness of breath. 05/07/22   Ria Bush, MD  aspirin 81 MG tablet Take 81 mg by mouth daily.    [provider]   blood glucose meter kit and supplies KIT Dispense based on patient and insurance preference. Use up to four times daily as directed. (FOR ICD-9 250.00, 250.01). 02/17/20   Elby Beck, FNP  buPROPion (WELLBUTRIN XL) 150 MG 24 hr tablet Take 3 tablets (450 mg total) by mouth daily. 05/17/22   Cottle, Billey Co., MD  carvedilol (COREG) 12.5 MG tablet TAKE 1 AND 1/2 TABLETS(18.75 MG) BY MOUTH TWICE DAILY 02/05/22   Lelon Perla, MD  Cholecalciferol (VITAMIN D3) 3000 units TABS Take 1 tablet by mouth daily. 06/26/17  Debbrah Alar, NP  cloNIDine (CATAPRES) 0.1 MG tablet TAKE 1 TABLET(0.1 MG) BY MOUTH TWICE DAILY 12/14/21   Lelon Perla, MD  Dulaglutide (TRULICITY) 3.15 VV/6.1YW SOPN Inject 0.75 mg into the skin once a week. 12/01/21   Ria Bush, MD  fluticasone [facility]) 50 MCG/ACT nasal spray Place 2 sprays into both nostrils daily. 05/10/21   Ria Bush, MD  glucose blood [facility] ULTRA) test strip Use as instructed to check blood sugar once a day 12/20/21   Ria Bush, MD  hydrALAZINE (APRESOLINE) 100 MG tablet TAKE 1 TABLET(100 MG) BY MOUTH IN THE MORNING AND AT BEDTIME 02/26/22   Lelon Perla, MD  Lancet Devices [facility] PLUS LANCING) MISC Check blood sugar once a day 09/01/21   Ria Bush, MD  Lancets [facility] DELICA PLUS [id number]) Elizabeth blood sugar once daily 08/30/21   Ria Bush, MD  lubiprostone (AMITIZA) 24 MCG capsule TAKE 1 CAPSULE(24 MCG) BY MOUTH TWICE DAILY WITH A MEAL 12/08/21   Ria Bush, MD  metFORMIN (GLUCOPHAGE-XR) 500 MG 24 hr tablet Take 1 tablet (500 mg total) by mouth every other day. 04/11/22   Ria Bush, MD  Multiple Vitamins-Minerals (MULTIVITAMIN ADULTS 50+ PO) Take 1 tablet every morning by mouth.     [provider]  olmesartan-hydrochlorothiazide (BENICAR HCT) 40-12.5 MG tablet TAKE 1 TABLET BY MOUTH DAILY 05/07/22   Lelon Perla, MD  pantoprazole (PROTONIX) 40 MG tablet TAKE  1 TABLET(40 MG) BY MOUTH EVERY MORNING 03/14/22   Ria Bush, MD  polyethylene glycol (MIRALAX / GLYCOLAX) 17 g packet Take 17 g by mouth daily.    [provider]  Probiotic Product (PROBIOTIC DAILY PO) Take 1 capsule by mouth every morning.    [provider]  rosuvastatin (CRESTOR) 20 MG tablet TAKE 1 TABLET(20 MG) BY MOUTH DAILY 02/26/22   Crenshaw, Denice Bors, MD    Family History Family History  Problem Relation Age of Onset   Esophageal cancer Mother    Cancer Mother        throat, cervical   Diabetes Mother    Stroke Mother    Heart attack Mother    Heart disease Mother    Hypertension Mother    Thyroid disease Mother    Alcoholism Mother    Liver disease Mother    Cancer Father        prostate   Diabetes Sister    Heart attack Sister    Hypertension Sister    Hyperlipidemia Sister    Colon cancer Neg Hx    Breast cancer Neg Hx    Colon polyps Neg Hx    Stomach cancer Neg Hx    Rectal cancer Neg Hx     Social History Social History   Tobacco Use   Smoking status: Former    Packs/day: 1.50    Years: 48.00    Total pack years: 72.00    Types: Cigarettes    Quit date: 09/23/2012    Years since quitting: 9.6   Smokeless tobacco: Never  Vaping Use   Vaping Use: Never used  Substance Use Topics   Alcohol use: Yes    Alcohol/week: 0.0 standard drinks of alcohol    Comment: occasional wine   Drug use: No     Allergies   Patient has no known allergies.   Review of Systems Review of Systems   Physical Exam Triage Vital Signs ED Triage Vitals  Enc Vitals Group     BP 05/28/22 1617 Marland Kitchen)  150/84     Pulse Rate 05/28/22 1617 65     Resp 05/28/22 1617 18     Temp 05/28/22 1617 98.1 F (36.7 C)     Temp Source 05/28/22 1617 Oral     SpO2 05/28/22 1617 96 %     Weight --      Height --      Head Circumference --      Peak Flow --      Pain Score 05/28/22 1611 0     Pain Loc --      Pain Edu? --      Excl. in [place]? --    No data  found.  Updated Vital Signs BP (!) 150/84 (BP Location: Left Arm)   Pulse 65   Temp 98.1 F (36.7 C) (Oral)   Resp 18   SpO2 96%   Visual Acuity Right Eye Distance:   Left Eye Distance:   Bilateral Distance:    Right Eye Near:   Left Eye Near:    Bilateral Near:     Physical Exam Vitals reviewed.  Constitutional:      Appearance: Normal appearance.  Eyes:     General:        Right eye: Discharge present.        Left eye: Discharge present.    Conjunctiva/sclera:     Right eye: Right conjunctiva is injected.     Left eye: Left conjunctiva is injected.     Comments: Watery discharge is present from both eyes.  Sclera injected bilaterally.  Skin:    General: Skin is warm and dry.  Neurological:     General: No focal deficit present.     Mental Status: She is alert and oriented to person, place, and time.  Psychiatric:        Mood and Affect: Mood normal.        Behavior: Behavior normal.      UC Treatments / Results  Labs (all labs ordered are listed, but only abnormal results are displayed) Labs Reviewed - No data to display  EKG   Radiology No results found.  Procedures Procedures (including critical care time)  Medications Ordered in UC Medications - No data to display  Initial Impression / Assessment and Plan / UC Course  I have reviewed the triage vital signs and the nursing notes.  Pertinent labs & imaging results that were available during my care of the patient were reviewed by me and considered in my medical decision making (see chart for details).   Likely viral conjunctivitis given her symptoms and lack of purulent discharge.  Asked her to try to stop rubbing the eyes.  She may use TheraTears which may provide some relief from itching, otherwise symptoms will resolve without treatment.   Final Clinical Impressions(s) / UC Diagnoses   Final diagnoses:  None   Discharge Instructions   None    ED Prescriptions   None    PDMP not  reviewed this encounter.   Rose Phi, [place] 05/28/22 1631

## 2022-05-28 NOTE — ED Triage Notes (Signed)
Pt. Presents to UC w/ c/o bilateral eye itching and redness.

## 2022-05-28 NOTE — Discharge Instructions (Signed)
Follow up here or with your primary care provider if your symptoms are worsening or not improving.     

## 2022-06-15 ENCOUNTER — Ambulatory Visit: Payer: PPO | Admitting: Podiatry

## 2022-06-15 ENCOUNTER — Encounter: Payer: Self-pay | Admitting: Podiatry

## 2022-06-15 VITALS — BP 138/69 | HR 69

## 2022-06-15 DIAGNOSIS — M79674 Pain in right toe(s): Secondary | ICD-10-CM | POA: Diagnosis not present

## 2022-06-15 DIAGNOSIS — B351 Tinea unguium: Secondary | ICD-10-CM

## 2022-06-15 DIAGNOSIS — M79675 Pain in left toe(s): Secondary | ICD-10-CM | POA: Diagnosis not present

## 2022-06-15 NOTE — Progress Notes (Signed)
Chief Complaint  Patient presents with   Nail Problem    "I'm here for a recheck of my feet."    SUBJECTIVE Patient presents to office today complaining of elongated, thickened nails that cause pain while ambulating in shoes.  Patient is unable to trim their own nails. Patient is here for further evaluation and treatment.  Past Medical History:  Diagnosis Date   Acquired deformity of right foot    Back pain    CAD (coronary artery disease) primary cardioloigst-  dr Stanford Breed   hx NSTEMI 10-24-2001  s/p  cardiac cath w/ PCI and DES x1 to first diagonal   Chronic constipation    Constipation    COVID-19 06/02/2020   Depression    Fatty liver 04/26/2015   GERD (gastroesophageal reflux disease)    History of abnormal cervical Pap smear    ACUS 2012   History of exercise stress test 05-25-2014  dr Stanford Breed   borderline (indeterminate) with non-specific ST changes (mild ST depression in leads II,III, aVF, & V6, did not meet criteria for ischemia) ;  pt had low intolerence, no cp, normal heartrate and bp response, no arrhythmias   History of goiter    as child s/p  removal ,  per pt benign , neck area  (not thyroid)   History of heart attack    History of non-ST elevation myocardial infarction (NSTEMI) 10/24/2001   s/p  PCI and DES to first diagonal   Hyperlipidemia    Hypertension    Joint pain    OSA on CPAP pulmoloigst-  dr young   per study 11-07-2004  severe osa   Right foot pain    S/P drug eluting coronary stent placement 10/27/2001   x1 to first diagonal    Seasonal and perennial allergic rhinitis    Sleep apnea    SOB (shortness of breath)    Type 2 diabetes mellitus ([place])    Vitamin D deficiency    Wears dentures    upper and lower partial denture   Wears glasses     No Known Allergies   OBJECTIVE General Patient is awake, alert, and oriented x 3 and in no acute distress. Derm Skin is dry and supple bilateral. Negative open lesions or macerations. Remaining  integument unremarkable. Nails are tender, long, thickened and dystrophic with subungual debris, consistent with onychomycosis, 1-5 bilateral. No signs of infection noted. Vasc  DP and PT pedal pulses palpable bilaterally. Temperature gradient within normal limits.  Neuro Epicritic and protective threshold sensation grossly intact bilaterally.  Musculoskeletal Exam No symptomatic pedal deformities noted bilateral. Muscular strength within normal limits.  ASSESSMENT 1.  Pain due to onychomycosis of toenails both  PLAN OF CARE 1. Patient evaluated today.  2. Instructed to maintain good pedal hygiene and foot care.  3. Mechanical debridement of nails 1-5 bilaterally performed using a nail nipper. Filed with dremel without incident.  4.  OTC formula 3 antifungal topical was dispensed at checkout.  Apply daily  5.  Return to clinic in 3 mos.    Edrick Kins, DPM [facility]  Dr. Edrick Kins, DPM    [address], [place] [address]  Office [phone]  Fax [phone]

## 2022-06-23 DIAGNOSIS — I10 Essential (primary) hypertension: Secondary | ICD-10-CM | POA: Diagnosis not present

## 2022-06-23 DIAGNOSIS — F32A Depression, unspecified: Secondary | ICD-10-CM | POA: Diagnosis not present

## 2022-06-23 DIAGNOSIS — R0683 Snoring: Secondary | ICD-10-CM | POA: Diagnosis not present

## 2022-06-23 DIAGNOSIS — G4733 Obstructive sleep apnea (adult) (pediatric): Secondary | ICD-10-CM | POA: Diagnosis not present

## 2022-06-27 ENCOUNTER — Encounter: Payer: Self-pay | Admitting: Family Medicine

## 2022-06-28 ENCOUNTER — Telehealth: Payer: Self-pay | Admitting: Family Medicine

## 2022-06-28 NOTE — Telephone Encounter (Signed)
Patient came in office and dropped off letter for PCP to review and has been placed in PCP folder.

## 2022-06-29 NOTE — Telephone Encounter (Addendum)
Yes please proceed with PA.  She is on trulicity and needs it to help control diabetes. She is already on daily metformin XR - GI upset limits metformin dosing.   Lab Results  Component Value Date   HGBA1C 6.2 03/05/2022

## 2022-06-29 NOTE — Telephone Encounter (Signed)
(  See 06/27/22 pt msg.)  Per letter, Trulicity is either non-formulary or is included on formulary but subject to certain limits.   Placed letter in Dr. Synthia Innocent box. Do you want a PA done?

## 2022-07-05 DIAGNOSIS — L821 Other seborrheic keratosis: Secondary | ICD-10-CM | POA: Diagnosis not present

## 2022-07-05 DIAGNOSIS — D225 Melanocytic nevi of trunk: Secondary | ICD-10-CM | POA: Diagnosis not present

## 2022-07-05 DIAGNOSIS — B078 Other viral warts: Secondary | ICD-10-CM | POA: Diagnosis not present

## 2022-07-05 DIAGNOSIS — L578 Other skin changes due to chronic exposure to nonionizing radiation: Secondary | ICD-10-CM | POA: Diagnosis not present

## 2022-07-05 DIAGNOSIS — L700 Acne vulgaris: Secondary | ICD-10-CM | POA: Diagnosis not present

## 2022-07-05 DIAGNOSIS — Z85828 Personal history of other malignant neoplasm of skin: Secondary | ICD-10-CM | POA: Diagnosis not present

## 2022-07-06 ENCOUNTER — Other Ambulatory Visit (HOSPITAL_COMMUNITY): Payer: Self-pay

## 2022-07-06 NOTE — Telephone Encounter (Signed)
Pharmacy Patient Advocate Encounter   Received notification that prior authorization for Trulicity 0.'75mg'$ /0.29m is required/requested.    PA submitted on 07/06/22 to (ins) [employer] via CoverMyMeds Key [id number]  Status is pending

## 2022-07-10 NOTE — Telephone Encounter (Signed)
Called patient let know that she can get medication from pharmacy. Will call if any questions.

## 2022-07-12 ENCOUNTER — Telehealth: Payer: Self-pay

## 2022-07-12 NOTE — Progress Notes (Signed)
Care Management & Coordination Services Pharmacy Team  Reason for Encounter: Appointment Reminder  Contacted patient to confirm telephone appointment with Charlene Brooke, PharmD on 07/18/2022 at 3:00. Patient moved appointment to 10:03 due to conflict.   Spoke with patient on 07/12/2022   Do you have any problems getting your medications? No  What is your top health concern you would like to discuss at your upcoming visit? No concerns  Have you seen any other providers since your last visit with PCP? Yes  Star Rating Drugs:  Medication:    Last Fill: Day Supply Trulicity 4.96 mg   11/64/35 28 Metformin 500 mg   Awaiting PA    Olmesartan-HCTZ 40 - 12.5 mg 05/07/22 90 Rosuvastatin 20 mg   05/24/22 90  Care Gaps: Annual wellness visit in last year? Yes 03/05/2022  Charlene Brooke, PharmD notified  Marijean Niemann, [place] Pharmacy Assistant [phone]

## 2022-07-13 ENCOUNTER — Other Ambulatory Visit (INDEPENDENT_AMBULATORY_CARE_PROVIDER_SITE_OTHER): Payer: PPO

## 2022-07-13 DIAGNOSIS — D649 Anemia, unspecified: Secondary | ICD-10-CM | POA: Diagnosis not present

## 2022-07-13 LAB — SEDIMENTATION RATE: Sed Rate: 18 mm/hr (ref 0–30)

## 2022-07-13 LAB — FOLATE: Folate: >23.8 ng/mL (ref >5.9)

## 2022-07-16 ENCOUNTER — Encounter: Payer: Self-pay | Admitting: Family Medicine

## 2022-07-16 LAB — CBC WITH DIFFERENTIAL/PLATELET
Absolute Monocytes: 301 cells/uL (ref 200–950)
Basophils Absolute: 22 cells/uL (ref 0–200)
Basophils Relative: 0.5 %
Eosinophils Absolute: 112 cells/uL (ref 15–500)
Eosinophils Relative: 2.6 %
HCT: 34.9 % — ABNORMAL LOW (ref 35.0–45.0)
Hemoglobin: 11.6 g/dL — ABNORMAL LOW (ref 11.7–15.5)
Lymphs Abs: 1028 cells/uL (ref 850–3900)
MCH: 27.8 pg (ref 27.0–33.0)
MCHC: 33.2 g/dL (ref 32.0–36.0)
MCV: 83.7 fL (ref 80.0–100.0)
MPV: 10.8 fL (ref 7.5–12.5)
Monocytes Relative: 7 %
Neutro Abs: 2838 cells/uL (ref 1500–7800)
Neutrophils Relative %: 66 %
Platelets: 138 10*3/uL — ABNORMAL LOW (ref 140–400)
RBC: 4.17 10*6/uL (ref 3.80–5.10)
RDW: 15.1 % — ABNORMAL HIGH (ref 11.0–15.0)
Total Lymphocyte: 23.9 %
WBC: 4.3 10*3/uL (ref 3.8–10.8)

## 2022-07-16 LAB — PATHOLOGIST SMEAR REVIEW

## 2022-07-16 LAB — ANTI-NUCLEAR AB-TITER (ANA TITER)
ANA TITER: 1:80 {titer} — ABNORMAL HIGH
ANA Titer 1: 1:40 {titer} — ABNORMAL HIGH

## 2022-07-16 LAB — ANA: Anti Nuclear Antibody (ANA): POSITIVE — AB

## 2022-07-17 ENCOUNTER — Telehealth: Payer: Self-pay | Admitting: Family Medicine

## 2022-07-17 NOTE — Telephone Encounter (Signed)
Patient would like to change appointment that she have on tomorrow 07/18/2022

## 2022-07-18 ENCOUNTER — Encounter: Payer: PPO | Admitting: Pharmacist

## 2022-07-18 DIAGNOSIS — I10 Essential (primary) hypertension: Secondary | ICD-10-CM | POA: Diagnosis not present

## 2022-07-18 DIAGNOSIS — G4733 Obstructive sleep apnea (adult) (pediatric): Secondary | ICD-10-CM | POA: Diagnosis not present

## 2022-07-19 ENCOUNTER — Encounter: Payer: Self-pay | Admitting: Family Medicine

## 2022-07-19 NOTE — Telephone Encounter (Signed)
Replied via results section.

## 2022-07-19 NOTE — Telephone Encounter (Cosign Needed)
Called patient; no answer; left message. I will try again tomorrow 07/20/22 for third attempt.  Charlene Brooke, PharmD notified  Marijean Niemann, [place] Clinical Pharmacy Assistant [phone]

## 2022-07-24 DIAGNOSIS — G4733 Obstructive sleep apnea (adult) (pediatric): Secondary | ICD-10-CM | POA: Diagnosis not present

## 2022-07-24 DIAGNOSIS — R0683 Snoring: Secondary | ICD-10-CM | POA: Diagnosis not present

## 2022-07-24 DIAGNOSIS — F32A Depression, unspecified: Secondary | ICD-10-CM | POA: Diagnosis not present

## 2022-07-24 DIAGNOSIS — I10 Essential (primary) hypertension: Secondary | ICD-10-CM | POA: Diagnosis not present

## 2022-07-24 NOTE — Telephone Encounter (Cosign Needed)
Unsuccessful outreach to reschedule patient.  Charlene Brooke, PharmD notified  Marijean Niemann, [place] Clinical Pharmacy Assistant [phone]

## 2022-07-26 NOTE — Progress Notes (Signed)
HPI: FU coronary disease. She has had a prior PCI of her diagonal. Her most recent catheterization in April of 2004 showed no obstructive disease, and her ejection fraction was normal. Nuclear study January 2020 showed normal perfusion and ejection fraction 67%.  Abdominal ultrasound August 2020 showed no aneurysm.  Carotid Dopplers November 2023 showed 1 to 39% bilateral stenosis.  Echocardiogram December 2023 showed normal LV function, grade 2 diastolic dysfunction, mild left atrial enlargement.  Since I last saw her, she has some dyspnea on exertion.  There is no orthopnea, PND, pedal edema, exertional chest pain or syncope.  She is complaining of fatigue.  Current Outpatient Medications  Medication Sig Dispense Refill   albuterol (VENTOLIN HFA) 108 (90 Base) MCG/ACT inhaler Inhale 1-2 puffs into the lungs every 6 (six) hours as needed for wheezing or shortness of breath. 1 each 0   aspirin 81 MG tablet Take 81 mg by mouth daily.     blood glucose meter kit and supplies KIT Dispense based on patient and insurance preference. Use up to four times daily as directed. (FOR ICD-9 250.00, 250.01). 1 each 0   buPROPion (WELLBUTRIN XL) 150 MG 24 hr tablet Take 3 tablets (450 mg total) by mouth daily. 270 tablet 3   carvedilol (COREG) 12.5 MG tablet TAKE 1 AND 1/2 TABLETS(18.75 MG) BY MOUTH TWICE DAILY 270 tablet 3   Cholecalciferol (VITAMIN D3) 3000 units TABS Take 1 tablet by mouth daily. 30 tablet    cloNIDine (CATAPRES) 0.1 MG tablet TAKE 1 TABLET(0.1 MG) BY MOUTH TWICE DAILY 180 tablet 3   Dulaglutide (TRULICITY) A999333 0000000 SOPN Inject 0.75 mg into the skin once a week. 2 mL 11   fluticasone (FLONASE) 50 MCG/ACT nasal spray Place 2 sprays into both nostrils daily. 16 g 3   glucose blood (ONETOUCH ULTRA) test strip Use as instructed to check blood sugar once a day 100 strip 3   hydrALAZINE (APRESOLINE) 100 MG tablet TAKE 1 TABLET(100 MG) BY MOUTH IN THE MORNING AND AT BEDTIME 180 tablet 1    Iron, Ferrous Sulfate, 325 (65 Fe) MG TABS Take 325 mg by mouth every Monday, Wednesday, and Friday.     Lancet Devices (ONETOUCH DELICA PLUS LANCING) MISC Check blood sugar once a day 1 each 0   Lancets (ONETOUCH DELICA PLUS 123XX123) MISC Check blood sugar once daily 100 each 3   metFORMIN (GLUCOPHAGE-XR) 500 MG 24 hr tablet Take 1 tablet (500 mg total) by mouth every other day. 45 tablet 3   Multiple Vitamins-Minerals (MULTIVITAMIN ADULTS 50+ PO) Take 1 tablet every morning by mouth.      olmesartan-hydrochlorothiazide (BENICAR HCT) 40-12.5 MG tablet TAKE 1 TABLET BY MOUTH DAILY 90 tablet 1   pantoprazole (PROTONIX) 40 MG tablet TAKE 1 TABLET(40 MG) BY MOUTH EVERY MORNING 90 tablet 3   polyethylene glycol (MIRALAX / GLYCOLAX) 17 g packet Take 17 g by mouth daily.     Probiotic Product (PROBIOTIC DAILY PO) Take 1 capsule by mouth every morning.     rosuvastatin (CRESTOR) 20 MG tablet TAKE 1 TABLET(20 MG) BY MOUTH DAILY 90 tablet 1   No current facility-administered medications for this visit.     Past Medical History:  Diagnosis Date   Acquired deformity of right foot    Back pain    CAD (coronary artery disease) primary cardioloigst-  dr Stanford Breed   hx NSTEMI 10-24-2001  s/p  cardiac cath w/ PCI and DES x1 to first diagonal  Chronic constipation    Constipation    COVID-19 06/02/2020   Depression    Fatty liver 04/26/2015   GERD (gastroesophageal reflux disease)    History of abnormal cervical Pap smear    ACUS 2012   History of exercise stress test 05-25-2014  dr Stanford Breed   borderline (indeterminate) with non-specific ST changes (mild ST depression in leads II,III, aVF, & V6, did not meet criteria for ischemia) ;  pt had low intolerence, no cp, normal heartrate and bp response, no arrhythmias   History of goiter    as child s/p  removal ,  per pt benign , neck area  (not thyroid)   History of heart attack    History of non-ST elevation myocardial infarction (NSTEMI) 10/24/2001    s/p  PCI and DES to first diagonal   Hyperlipidemia    Hypertension    Joint pain    OSA on CPAP pulmoloigst-  dr young   per study 11-07-2004  severe osa   Right foot pain    S/P drug eluting coronary stent placement 10/27/2001   x1 to first diagonal    Seasonal and perennial allergic rhinitis    Sleep apnea    SOB (shortness of breath)    Type 2 diabetes mellitus ([place])    Vitamin D deficiency    Wears dentures    upper and lower partial denture   Wears glasses     Past Surgical History:  Procedure Laterality Date   BUNIONECTOMY Left 2012   BUNIONECTOMY Right 04/19/2017   Procedure: RIGHT BUNIONECTOMY;  Surgeon: Rosemary Holms, DPM;  Location: [facility];  Service: Podiatry;  Laterality: Right;   CARDIAC CATHETERIZATION  09-11-2002   dr Lia Foyer   well-preserved LVF; continued patency previouly placed stent in diagonal branch;  mild luminal irregularities    CARDIOVASCULAR STRESS TEST  05-16-2011   dr Stanford Breed   normal nuclear study w/ no evidence ischemia/  normal LV function and wall motion , ef 77%   COLONOSCOPY  06/2021   TAs, diverticulosis, int hem, rpt 3 yrs ([place])   CORONARY ANGIOPLASTY WITH STENT PLACEMENT  10-27-2001   dr Lia Foyer   high-grade stenosis first diagonal post PCI and DES (TAXUS);  mild luminal irregularity throughout LAD, RCA, and very mild CFx;  preserved LVF   goiter removal  1968   per pt benign tumor removed from neck beside throat   HALLUX VALGUS LAPIDUS Right 04/19/2017   Procedure: [place];  Surgeon: Rosemary Holms, DPM;  Location: [facility];  Service: Podiatry;  Laterality: Right;   HAMMER TOE SURGERY Right 04/19/2017   Procedure: HAMMER TOE CORRECTION RIGHT 2ND;  Surgeon: Rosemary Holms, DPM;  Location: 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    Employer: [employer]   Occupation: Retired  Tobacco Use   Smoking status: Former  Packs/day: 1.50    Years: 48.00    Total pack years: 72.00    Types: Cigarettes    Quit date: 09/23/2012    Years since quitting: 9.8   Smokeless tobacco: Never  Vaping Use   Vaping Use: Never used  Substance and Sexual Activity   Alcohol use: Yes    Alcohol/week: 0.0 standard drinks of alcohol    Comment: occasional wine   Drug use: No   Sexual activity: Not Currently  Other Topics Concern   Not on file  Social History Narrative   Caffeine Use:  2 cups coffee daily   Regular exercise:  No      Social Determinants of Health   Financial Resource Strain: Low Risk  (03/01/2022)   Overall Financial Resource Strain (CARDIA)    Difficulty of Paying Living Expenses: Not hard at all  Food Insecurity: No Food Insecurity (03/01/2022)   Hunger Vital Sign    Worried About Running Out of Food in the Last Year: Never true    Ran Out of Food in the Last Year: Never true  Transportation Needs: No Transportation Needs (03/01/2022)   PRAPARE - Hydrologist (Medical): No    Lack of Transportation (Non-Medical): No  Physical Activity: Insufficiently Active (03/01/2022)   Exercise Vital Sign    Days of Exercise per Week: 3 days    Minutes of Exercise per Session: 20 min  Stress: No Stress Concern Present (03/01/2022)   [place] of Stress : Not at all   Social Connections: [place] (03/01/2022)   Social Connection and Isolation Panel [NHANES]    Frequency of Communication with Friends and Family: More than three times a week    Frequency of Social Gatherings with Friends and Family: More than three times a week    Attends Religious Services: More than 4 times per year    Active Member of [employer] or Organizations: Yes    Attends Music therapist: More than 4 times per year    Marital Status: Married  Human resources officer Violence: Not At Risk (03/01/2022)   Humiliation, Afraid, Rape, and Kick questionnaire    Fear of Current or Ex-Partner: No    Emotionally Abused: No    Physically Abused: No    Sexually Abused: No    Family History  Problem Relation Age of Onset   Esophageal cancer Mother    Cancer Mother        throat, cervical   Diabetes Mother    Stroke Mother    Heart attack Mother    Heart disease Mother    Hypertension Mother    Thyroid disease Mother    Alcoholism Mother    Liver disease Mother    Cancer Father        prostate   Diabetes Sister    Heart attack Sister    Hypertension Sister    Hyperlipidemia Sister    Colon cancer Neg Hx    Breast cancer Neg Hx    Colon polyps Neg Hx    Stomach cancer Neg Hx    Rectal cancer Neg Hx     ROS: no fevers or chills, productive cough, hemoptysis, dysphasia, odynophagia, melena, hematochezia, dysuria, hematuria, rash, seizure activity, orthopnea, PND, pedal edema, claudication. Remaining systems are negative.  Physical Exam: Well-developed obese in no acute distress.  Skin is warm and dry.  HEENT is normal.  Neck is  supple.  Chest is clear to auscultation with normal expansion.  Cardiovascular exam is regular rate and rhythm.  2/6 systolic murmur left sternal border.  S2 is not diminished. Abdominal exam nontender or distended. No masses palpated. Extremities show no edema. neuro grossly intact   A/P  1 coronary artery disease-patient denies  chest pain.  However she is complaining of dyspnea on exertion which is worse compared to previous.  We will arrange a [place] nuclear study to screen for ischemia.  Continue medical therapy with aspirin and statin.  2 hypertension-patient's blood pressure is controlled.  However she is having some degree of fatigue.  I wonder if clonidine is contributing.  I will decrease to 0.1 mg daily for 3 days then discontinue.  Follow blood pressure and increase hydralazine if needed.  3 hyperlipidemia-continue statin.  4 lower extremity edema-controlled.  Continue diuretic at present dose.  Kirk Ruths, MD

## 2022-07-30 ENCOUNTER — Encounter: Payer: Self-pay | Admitting: *Deleted

## 2022-07-30 ENCOUNTER — Encounter: Payer: Self-pay | Admitting: Cardiology

## 2022-07-30 ENCOUNTER — Other Ambulatory Visit: Payer: Self-pay | Admitting: *Deleted

## 2022-07-30 ENCOUNTER — Ambulatory Visit (INDEPENDENT_AMBULATORY_CARE_PROVIDER_SITE_OTHER): Payer: PPO | Admitting: Cardiology

## 2022-07-30 VITALS — BP 123/65 | HR 70 | Ht 62.0 in | Wt 182.0 lb

## 2022-07-30 DIAGNOSIS — E785 Hyperlipidemia, unspecified: Secondary | ICD-10-CM | POA: Diagnosis not present

## 2022-07-30 DIAGNOSIS — I2510 Atherosclerotic heart disease of native coronary artery without angina pectoris: Secondary | ICD-10-CM

## 2022-07-30 DIAGNOSIS — I10 Essential (primary) hypertension: Secondary | ICD-10-CM

## 2022-07-30 NOTE — Patient Instructions (Signed)
Medication Instructions:   REDUCRE CLONIDINE TO 0.1 MG ONCE DAILY X 3 DAYS THEN STOP  *If you need a refill on your cardiac medications before your next appointment, please call your pharmacy*   Testing/Procedures:  Your physician has requested that you have a lexiscan myoview. For further information please visit [email or web address]. Please follow instruction sheet, as given. [place], you and your health needs are our priority.  As part of our continuing mission to provide you with exceptional heart care, we have created designated Provider Care Teams.  These Care Teams include your primary Cardiologist (physician) and Advanced Practice Providers (APPs -  Physician Assistants and Nurse Practitioners) who all work together to provide you with the care you need, when you need it.  We recommend signing up for the patient portal called "MyChart".  Sign up information is provided on this After Visit Summary.  MyChart is used to connect with patients for Virtual Visits (Telemedicine).  Patients are able to view lab/test results, encounter notes, upcoming appointments, etc.  Non-urgent messages can be sent to your provider as well.   To learn more about what you can do with MyChart, go to [email or web address].    Your next appointment:   6 month(s)  Provider:   Kirk Ruths, MD

## 2022-08-01 ENCOUNTER — Telehealth (HOSPITAL_COMMUNITY): Payer: Self-pay | Admitting: *Deleted

## 2022-08-01 NOTE — Telephone Encounter (Signed)
Per DPR left detailed instructions for MPI study scheduled on 08/06/22 on cell phone vm

## 2022-08-03 ENCOUNTER — Encounter: Payer: Self-pay | Admitting: Cardiology

## 2022-08-06 ENCOUNTER — Ambulatory Visit (HOSPITAL_COMMUNITY): Payer: PPO | Attending: Cardiology

## 2022-08-06 DIAGNOSIS — I2510 Atherosclerotic heart disease of native coronary artery without angina pectoris: Secondary | ICD-10-CM | POA: Diagnosis not present

## 2022-08-06 LAB — MYOCARDIAL PERFUSION IMAGING
LV dias vol: 61 mL (ref 46–106)
LV sys vol: 18 mL
Nuc Stress EF: 71 %
Peak HR: 85 {beats}/min
Rest HR: 63 {beats}/min
Rest Nuclear Isotope Dose: 10.9 mCi
SDS: 2
SRS: 0
SSS: 2
ST Depression (mm): 0 mm
Stress Nuclear Isotope Dose: 31.3 mCi
TID: 1

## 2022-08-06 MED ORDER — HYDRALAZINE HCL 100 MG PO TABS: 100.0000 mg | ORAL_TABLET | Freq: Three times a day (TID) | ORAL | 3 refills | Status: DC

## 2022-08-06 MED ORDER — TECHNETIUM TC 99M TETROFOSMIN IV KIT
10.9000 | PACK | Freq: Once | INTRAVENOUS | Status: AC | PRN
  Administered 2022-08-06: 10.9 via INTRAVENOUS

## 2022-08-06 MED ORDER — REGADENOSON 0.4 MG/5ML IV SOLN
0.4000 mg | Freq: Once | INTRAVENOUS | Status: AC
  Administered 2022-08-06: 0.4 mg via INTRAVENOUS

## 2022-08-06 MED ORDER — TECHNETIUM TC 99M TETROFOSMIN IV KIT
31.3000 | PACK | Freq: Once | INTRAVENOUS | Status: AC | PRN
  Administered 2022-08-06: 31.3 via INTRAVENOUS

## 2022-08-14 DIAGNOSIS — M25531 Pain in right wrist: Secondary | ICD-10-CM | POA: Diagnosis not present

## 2022-08-21 ENCOUNTER — Encounter: Payer: Self-pay | Admitting: Podiatry

## 2022-08-22 DIAGNOSIS — I10 Essential (primary) hypertension: Secondary | ICD-10-CM | POA: Diagnosis not present

## 2022-08-22 DIAGNOSIS — G4733 Obstructive sleep apnea (adult) (pediatric): Secondary | ICD-10-CM | POA: Diagnosis not present

## 2022-08-22 DIAGNOSIS — R0683 Snoring: Secondary | ICD-10-CM | POA: Diagnosis not present

## 2022-08-22 DIAGNOSIS — F32A Depression, unspecified: Secondary | ICD-10-CM | POA: Diagnosis not present

## 2022-08-23 DIAGNOSIS — M25531 Pain in right wrist: Secondary | ICD-10-CM | POA: Diagnosis not present

## 2022-08-23 DIAGNOSIS — S5002XA Contusion of left elbow, initial encounter: Secondary | ICD-10-CM | POA: Diagnosis not present

## 2022-08-31 ENCOUNTER — Ambulatory Visit (INDEPENDENT_AMBULATORY_CARE_PROVIDER_SITE_OTHER): Payer: PPO | Admitting: Family Medicine

## 2022-08-31 ENCOUNTER — Encounter: Payer: Self-pay | Admitting: Family Medicine

## 2022-08-31 VITALS — BP 128/62 | HR 75 | Temp 97.3°F | Ht 62.0 in | Wt 182.0 lb

## 2022-08-31 DIAGNOSIS — E669 Obesity, unspecified: Secondary | ICD-10-CM | POA: Diagnosis not present

## 2022-08-31 DIAGNOSIS — R5382 Chronic fatigue, unspecified: Secondary | ICD-10-CM

## 2022-08-31 DIAGNOSIS — K5909 Other constipation: Secondary | ICD-10-CM | POA: Diagnosis not present

## 2022-08-31 DIAGNOSIS — E1169 Type 2 diabetes mellitus with other specified complication: Secondary | ICD-10-CM

## 2022-08-31 DIAGNOSIS — I2510 Atherosclerotic heart disease of native coronary artery without angina pectoris: Secondary | ICD-10-CM | POA: Diagnosis not present

## 2022-08-31 DIAGNOSIS — E118 Type 2 diabetes mellitus with unspecified complications: Secondary | ICD-10-CM

## 2022-08-31 LAB — POCT GLYCOSYLATED HEMOGLOBIN (HGB A1C): Hemoglobin A1C: 5.8 % — AB (ref 4.0–5.6)

## 2022-08-31 NOTE — Patient Instructions (Addendum)
I'm glad you're doing better regarding fatigue. We will refer you to refresher course with nutritionist in [place]. Continue healthy diet choices, limiting carbs and sugar to control diabetes, work on [employer]. Good to see you today Return as needed or in 6 months for physical/wellness visit

## 2022-08-31 NOTE — Assessment & Plan Note (Signed)
Appreciate cardiology care - recent reassuring [place].

## 2022-08-31 NOTE — Assessment & Plan Note (Addendum)
Notes improvement in fatigue since coming off clonidine and starting QOD oral iron replacement.  Encouraged incorporating regular exercise in routine.  Notes ongoing difficulty with motivation, however depression is well controlled on Wellbutrin XL 450mg  daily.

## 2022-08-31 NOTE — Progress Notes (Signed)
Patient ID: [name] [name], female    DOB: 12/24/1950, 72 y.o.   MRN: [id number]  This visit was conducted in person.  BP 128/62   Pulse 75   Temp (!) 97.3 F (36.3 C) (Temporal)   Ht 5\' 2"  (1.575 m)   Wt 182 lb (82.6 kg)   SpO2 95%   BMI 33.29 kg/m   BP Readings from Last 3 Encounters:  08/31/22 128/62  07/30/22 123/65  06/15/22 138/69   CC: 6 mo DM f/u visit  Subjective:   HPI: [name] [name] is a 72 y.o. female presenting on 08/31/2022 for Medical Management of Chronic Issues (Here for 6 mo DM f/u and rechk weakness. )   Ongoing fatigue - cardiology tapered off clonidine in a effort to help this. She is also on BB. Lexiscan perfusion stress test reassuring last month.  Last month she was found to have positive ANA with low titers (1:40 cytoplasmic, 1:80 homogeneous nuclear) thought false positive. Previous workup - normal iron panel, ferritin low at 15. Normal TSH, b12, folate, ESR and vit D.   She feels fatigue is slowly improving - both off clonidine and with iron. She has started water aerobics. Notes persistent low energy - no get up and go, no motivation. Mood ok - on wellbutrin XL 450mg  daily. She's had 2 falls in the past few months - injured R wrist then again tripped over uneven pavement - injured left elbow. Notes ongoing pain to L elbow olecranon bursa. Saw ortho, dx with sprain no fracture.   She has previously seen [facility] wellness center. She is interested in refresher course. Discussed low carb thin options to replace breads.   Constipation - better with probiotic and miralax daily.   DM - does regularly check sugars fasting 130-160 this morning. Compliant with antihyperglycemic regimen which includes: metformin XR 500mg  daily, trulicity 0.75mg  weekly. Denies low sugars or hypoglycemic symptoms. Notes paresthesias to R>L feet, denies blurry vision. Last diabetic eye exam 01/2022. Glucometer brand: one-touch delica. Last foot exam: 08/2021 - DUE. DSME:  completed remotely.  Lab Results  Component Value Date   HGBA1C 5.8 (A) 08/31/2022   Diabetic Foot Exam - Simple   Simple Foot Form Diabetic Foot exam was performed with the following findings: Yes 08/31/2022 11:23 AM  Visual Inspection No deformities, no ulcerations, no other skin breakdown bilaterally: Yes Sensation Testing Intact to touch and monofilament testing bilaterally: Yes Pulse Check Posterior Tibialis and Dorsalis pulse intact bilaterally: Yes Comments H/o foot surgeries - bilat bunion surgeries as well as right 2nd hammer toe     Lab Results  Component Value Date   MICROALBUR 2.3 (H) 03/05/2022         Relevant past medical, surgical, family and social history reviewed and updated as indicated. Interim medical history since our last visit reviewed. Allergies and medications reviewed and updated. Outpatient Medications Prior to Visit  Medication Sig Dispense Refill   albuterol (VENTOLIN HFA) 108 (90 Base) MCG/ACT inhaler Inhale 1-2 puffs into the lungs every 6 (six) hours as needed for wheezing or shortness of breath. 1 each 0   aspirin 81 MG tablet Take 81 mg by mouth daily.     blood glucose meter kit and supplies KIT Dispense based on patient and insurance preference. Use up to four times daily as directed. (FOR ICD-9 250.00, 250.01). 1 each 0   buPROPion (WELLBUTRIN XL) 150 MG 24 hr tablet Take 3 tablets (450 mg total) by mouth  daily. 270 tablet 3   carvedilol (COREG) 12.5 MG tablet TAKE 1 AND 1/2 TABLETS(18.75 MG) BY MOUTH TWICE DAILY 270 tablet 3   Cholecalciferol (VITAMIN D3) 3000 units TABS Take 1 tablet by mouth daily. 30 tablet    Dulaglutide (TRULICITY) A999333 0000000 SOPN Inject 0.75 mg into the skin once a week. 2 mL 11   fluticasone (FLONASE) 50 MCG/ACT nasal spray Place 2 sprays into both nostrils daily. 16 g 3   glucose blood (ONETOUCH ULTRA) test strip Use as instructed to check blood sugar once a day 100 strip 3   hydrALAZINE (APRESOLINE) 100 MG  tablet Take 1 tablet (100 mg total) by mouth 3 (three) times daily. 270 tablet 3   Iron, Ferrous Sulfate, 325 (65 Fe) MG TABS Take 325 mg by mouth every Monday, Wednesday, and Friday.     Lancet Devices (ONETOUCH DELICA PLUS LANCING) MISC Check blood sugar once a day 1 each 0   Lancets (ONETOUCH DELICA PLUS 123XX123) MISC Check blood sugar once daily 100 each 3   metFORMIN (GLUCOPHAGE-XR) 500 MG 24 hr tablet Take 1 tablet (500 mg total) by mouth every other day. 45 tablet 3   Multiple Vitamins-Minerals (MULTIVITAMIN ADULTS 50+ PO) Take 1 tablet every morning by mouth.      olmesartan-hydrochlorothiazide (BENICAR HCT) 40-12.5 MG tablet TAKE 1 TABLET BY MOUTH DAILY 90 tablet 1   pantoprazole (PROTONIX) 40 MG tablet TAKE 1 TABLET(40 MG) BY MOUTH EVERY MORNING 90 tablet 3   polyethylene glycol (MIRALAX / GLYCOLAX) 17 g packet Take 17 g by mouth daily.     Probiotic Product (PROBIOTIC DAILY PO) Take 1 capsule by mouth every morning.     rosuvastatin (CRESTOR) 20 MG tablet TAKE 1 TABLET(20 MG) BY MOUTH DAILY 90 tablet 1   cloNIDine (CATAPRES) 0.1 MG tablet TAKE 1 TABLET(0.1 MG) BY MOUTH TWICE DAILY 180 tablet 3   No facility-administered medications prior to visit.     Per HPI unless specifically indicated in ROS section below Review of Systems  Objective:  BP 128/62   Pulse 75   Temp (!) 97.3 F (36.3 C) (Temporal)   Ht 5\' 2"  (1.575 m)   Wt 182 lb (82.6 kg)   SpO2 95%   BMI 33.29 kg/m   Wt Readings from Last 3 Encounters:  08/31/22 182 lb (82.6 kg)  08/06/22 182 lb (82.6 kg)  07/30/22 182 lb (82.6 kg)      Physical Exam Vitals and nursing note reviewed.  Constitutional:      Appearance: Normal appearance. She is not ill-appearing.  HENT:     Head: Normocephalic and atraumatic.     Mouth/Throat:     Mouth: Mucous membranes are moist.     Pharynx: Oropharynx is clear. No oropharyngeal exudate or posterior oropharyngeal erythema.  Eyes:     Extraocular Movements: Extraocular  movements intact.     Conjunctiva/sclera: Conjunctivae normal.     Pupils: Pupils are equal, round, and reactive to light.  Cardiovascular:     Rate and Rhythm: Normal rate and regular rhythm.     Pulses: Normal pulses.     Heart sounds: Murmur (3/6 systolic USB) heard.  Pulmonary:     Effort: Pulmonary effort is normal. No respiratory distress.     Breath sounds: Normal breath sounds. No wheezing, rhonchi or rales.  Musculoskeletal:     Right lower leg: No edema.     Left lower leg: No edema.  Skin:    General: Skin is warm  and dry.     Findings: No rash.  Neurological:     Mental Status: She is alert.  Psychiatric:        Mood and Affect: Mood normal.        Behavior: Behavior normal.       Results for orders placed or performed in visit on 08/31/22  POCT glycosylated hemoglobin (Hb A1C)  Result Value Ref Range   Hemoglobin A1C 5.8 (A) 4.0 - 5.6 %   HbA1c POC (<> result, manual entry)     HbA1c, POC (prediabetic range)     HbA1c, POC (controlled diabetic range)     Lab Results  Component Value Date   FERRITIN 15.4 04/11/2022    Lab Results  Component Value Date   WBC 4.3 07/13/2022   HGB 11.6 (L) 07/13/2022   HCT 34.9 (L) 07/13/2022   MCV 83.7 07/13/2022   PLT 138 (L) 07/13/2022    Assessment & Plan:   Problem List Items Addressed This Visit     Type 2 diabetes mellitus with other specified complication ([place]) - Primary    Chronic, stable on current regimen.  Will not change regimen given good A1c control, rather she will focus on renewed efforts to follow low sugar low carb diabetic diet, aerobic exercise routine and will refer for 2 hr nutrition refresher course.       Relevant Orders   POCT glycosylated hemoglobin (Hb A1C) (Completed)   Ambulatory referral to diabetic education   Coronary atherosclerosis    Appreciate cardiology care - recent reassuring Lexiscan.       Obesity, Class I, BMI 30-34.9    Encouraged healthy diet and lifestyle changes to  affect sustainable weight loss.        Chronic constipation    Stable period on daily probiotic and miralax.       Chronic fatigue    Notes improvement in fatigue since coming off clonidine and starting QOD oral iron replacement.  Encouraged incorporating regular exercise in routine.  Notes ongoing difficulty with motivation, however depression is well controlled on Wellbutrin XL 450mg  daily.         No orders of the defined types were placed in this encounter.   Orders Placed This Encounter  Procedures   Ambulatory referral to diabetic education    Referral Priority:   Routine    Referral Type:   Consultation    Referral Reason:   Specialty Services Required    Number of Visits Requested:   1   POCT glycosylated hemoglobin (Hb A1C)    Patient Instructions  I'm glad you're doing better regarding fatigue. We will refer you to refresher course with nutritionist in [place]. Continue healthy diet choices, limiting carbs and sugar to control diabetes, work on [employer]. Good to see you today Return as needed or in 6 months for physical/wellness visit    Follow up plan: Return in about 6 months (around 03/03/2023) for annual exam, prior fasting for blood work, medicare wellness visit.  Ria Bush, MD

## 2022-08-31 NOTE — Assessment & Plan Note (Signed)
Stable period on daily probiotic and miralax.

## 2022-08-31 NOTE — Assessment & Plan Note (Addendum)
Chronic, stable on current regimen.  Will not change regimen given good A1c control, rather she will focus on renewed efforts to follow low sugar low carb diabetic diet, aerobic exercise routine and will refer for 2 hr nutrition refresher course.

## 2022-08-31 NOTE — Assessment & Plan Note (Signed)
Encouraged healthy diet and lifestyle changes to affect sustainable weight loss.  

## 2022-09-01 ENCOUNTER — Other Ambulatory Visit: Payer: Self-pay | Admitting: Family Medicine

## 2022-09-01 DIAGNOSIS — E118 Type 2 diabetes mellitus with unspecified complications: Secondary | ICD-10-CM

## 2022-09-04 ENCOUNTER — Other Ambulatory Visit: Payer: Self-pay | Admitting: *Deleted

## 2022-09-04 MED ORDER — ROSUVASTATIN CALCIUM 20 MG PO TABS: ORAL_TABLET | ORAL | 3 refills | Status: DC

## 2022-09-21 ENCOUNTER — Ambulatory Visit: Payer: PPO | Admitting: Podiatry

## 2022-09-22 DIAGNOSIS — G4733 Obstructive sleep apnea (adult) (pediatric): Secondary | ICD-10-CM | POA: Diagnosis not present

## 2022-09-22 DIAGNOSIS — F32A Depression, unspecified: Secondary | ICD-10-CM | POA: Diagnosis not present

## 2022-09-22 DIAGNOSIS — I10 Essential (primary) hypertension: Secondary | ICD-10-CM | POA: Diagnosis not present

## 2022-09-22 DIAGNOSIS — R0683 Snoring: Secondary | ICD-10-CM | POA: Diagnosis not present

## 2022-09-25 ENCOUNTER — Encounter: Payer: Self-pay | Admitting: Podiatry

## 2022-09-25 ENCOUNTER — Ambulatory Visit: Payer: PPO | Admitting: Podiatry

## 2022-09-25 DIAGNOSIS — M79675 Pain in left toe(s): Secondary | ICD-10-CM

## 2022-09-25 DIAGNOSIS — M79674 Pain in right toe(s): Secondary | ICD-10-CM

## 2022-09-25 DIAGNOSIS — B351 Tinea unguium: Secondary | ICD-10-CM | POA: Diagnosis not present

## 2022-09-25 NOTE — Progress Notes (Signed)
Chief Complaint  Patient presents with   Nail Problem    "Cut my toenails."    SUBJECTIVE Patient presents to office today complaining of elongated, thickened nails that cause pain while ambulating in shoes.  Patient is unable to trim their own nails. Patient is here for further evaluation and treatment.  Past Medical History:  Diagnosis Date   Acquired deformity of right foot    Back pain    CAD (coronary artery disease) primary cardioloigst-  dr Jens Som   hx NSTEMI 10-24-2001  s/p  cardiac cath w/ PCI and DES x1 to first diagonal   Chronic constipation    Constipation    COVID-19 06/02/2020   Depression    Fatty liver 04/26/2015   GERD (gastroesophageal reflux disease)    History of abnormal cervical Pap smear    ACUS 2012   History of exercise stress test 05-25-2014  dr Jens Som   borderline (indeterminate) with non-specific ST changes (mild ST depression in leads II,III, aVF, & V6, did not meet criteria for ischemia) ;  pt had low intolerence, no cp, normal heartrate and bp response, no arrhythmias   History of goiter    as child s/p  removal ,  per pt benign , neck area  (not thyroid)   History of heart attack    History of non-ST elevation myocardial infarction (NSTEMI) 10/24/2001   s/p  PCI and DES to first diagonal   Hyperlipidemia    Hypertension    Joint pain    OSA on CPAP pulmoloigst-  dr young   per study 11-07-2004  severe osa   Right foot pain    S/P drug eluting coronary stent placement 10/27/2001   x1 to first diagonal    Seasonal and perennial allergic rhinitis    Sleep apnea    SOB (shortness of breath)    Type 2 diabetes mellitus    Vitamin D deficiency    Wears dentures    upper and lower partial denture   Wears glasses     No Known Allergies   OBJECTIVE General Patient is awake, alert, and oriented x 3 and in no acute distress. Derm Skin is dry and supple bilateral. Negative open lesions or macerations. Remaining integument unremarkable.  Nails are tender, long, thickened and dystrophic with subungual debris, consistent with onychomycosis, 1-5 bilateral. No signs of infection noted. Vasc  DP and PT pedal pulses palpable bilaterally. Temperature gradient within normal limits.  Neuro Epicritic and protective threshold sensation grossly intact bilaterally.  Musculoskeletal Exam No symptomatic pedal deformities noted bilateral. Muscular strength within normal limits.  ASSESSMENT 1.  Pain due to onychomycosis of toenails both  PLAN OF CARE 1. Patient evaluated today.  2. Instructed to maintain good pedal hygiene and foot care.  3. Mechanical debridement of nails 1-5 bilaterally performed using a nail nipper. Filed with dremel without incident.  4.  Continue OTC formula 3 antifungal topical that was dispensed at checkout.  Apply daily  5.  Return to clinic in 3 mos.    Felecia Shelling, DPM [facility]  Dr. Felecia Shelling, DPM    [address] [place] [address]                Office [phone])  [phone]  Fax [phone]

## 2022-09-27 ENCOUNTER — Encounter: Payer: Self-pay | Admitting: Cardiology

## 2022-10-01 ENCOUNTER — Ambulatory Visit
Admission: RE | Admit: 2022-10-01 | Discharge: 2022-10-01 | Disposition: A | Discharge status: Home / Self Care | Payer: PPO | Source: Ambulatory Visit | Attending: Obstetrics & Gynecology | Admitting: Obstetrics & Gynecology

## 2022-10-01 DIAGNOSIS — Z780 Asymptomatic menopausal state: Secondary | ICD-10-CM

## 2022-10-01 DIAGNOSIS — M8589 Other specified disorders of bone density and structure, multiple sites: Secondary | ICD-10-CM | POA: Diagnosis not present

## 2022-10-22 DIAGNOSIS — F32A Depression, unspecified: Secondary | ICD-10-CM | POA: Diagnosis not present

## 2022-10-22 DIAGNOSIS — I10 Essential (primary) hypertension: Secondary | ICD-10-CM | POA: Diagnosis not present

## 2022-10-22 DIAGNOSIS — R0683 Snoring: Secondary | ICD-10-CM | POA: Diagnosis not present

## 2022-10-22 DIAGNOSIS — G4733 Obstructive sleep apnea (adult) (pediatric): Secondary | ICD-10-CM | POA: Diagnosis not present

## 2022-10-30 ENCOUNTER — Encounter: Payer: Self-pay | Admitting: Cardiology

## 2022-10-30 MED ORDER — AMLODIPINE BESYLATE 5 MG PO TABS: 5.0000 mg | ORAL_TABLET | Freq: Every day | ORAL | 3 refills | Status: DC

## 2022-11-14 ENCOUNTER — Other Ambulatory Visit: Payer: Self-pay | Admitting: Acute Care

## 2022-11-14 DIAGNOSIS — Z122 Encounter for screening for malignant neoplasm of respiratory organs: Secondary | ICD-10-CM

## 2022-11-14 DIAGNOSIS — Z87891 Personal history of nicotine dependence: Secondary | ICD-10-CM

## 2022-11-22 DIAGNOSIS — G4733 Obstructive sleep apnea (adult) (pediatric): Secondary | ICD-10-CM | POA: Diagnosis not present

## 2022-11-22 DIAGNOSIS — F32A Depression, unspecified: Secondary | ICD-10-CM | POA: Diagnosis not present

## 2022-11-22 DIAGNOSIS — I10 Essential (primary) hypertension: Secondary | ICD-10-CM | POA: Diagnosis not present

## 2022-11-22 DIAGNOSIS — R0683 Snoring: Secondary | ICD-10-CM | POA: Diagnosis not present

## 2022-11-26 ENCOUNTER — Encounter: Payer: Self-pay | Admitting: Family Medicine

## 2022-11-26 ENCOUNTER — Other Ambulatory Visit: Payer: Self-pay | Admitting: Cardiology

## 2022-11-26 DIAGNOSIS — J329 Chronic sinusitis, unspecified: Secondary | ICD-10-CM

## 2022-11-26 DIAGNOSIS — E7800 Pure hypercholesterolemia, unspecified: Secondary | ICD-10-CM

## 2022-11-26 MED ORDER — FLUTICASONE PROPIONATE 50 MCG/ACT NA SUSP: 2.0000 | Freq: Every day | NASAL | 3 refills | Status: DC

## 2022-11-26 NOTE — Telephone Encounter (Signed)
E-scribed refill sent to [facility].

## 2022-12-05 ENCOUNTER — Ambulatory Visit: Payer: PPO | Admitting: Dietician

## 2022-12-11 ENCOUNTER — Other Ambulatory Visit: Payer: Self-pay | Admitting: Family Medicine

## 2022-12-11 ENCOUNTER — Encounter: Payer: Self-pay | Admitting: Family Medicine

## 2022-12-11 DIAGNOSIS — E1169 Type 2 diabetes mellitus with other specified complication: Secondary | ICD-10-CM

## 2022-12-11 NOTE — Telephone Encounter (Signed)
Trulicity Last filled:  5/30/204, #2 mL Last OV:  08/31/22, 6 mo DM f/u Next OV:  03/04/23, CPE

## 2022-12-21 DIAGNOSIS — G4733 Obstructive sleep apnea (adult) (pediatric): Secondary | ICD-10-CM | POA: Diagnosis not present

## 2022-12-21 DIAGNOSIS — I10 Essential (primary) hypertension: Secondary | ICD-10-CM | POA: Diagnosis not present

## 2022-12-28 ENCOUNTER — Ambulatory Visit: Payer: PPO | Admitting: Podiatry

## 2023-01-07 ENCOUNTER — Ambulatory Visit: Admission: RE | Admit: 2023-01-07 | Payer: PPO | Source: Ambulatory Visit

## 2023-01-15 ENCOUNTER — Ambulatory Visit
Admission: RE | Admit: 2023-01-15 | Discharge: 2023-01-15 | Disposition: A | Discharge status: Home / Self Care | Payer: PPO | Source: Ambulatory Visit | Attending: Acute Care | Admitting: Acute Care

## 2023-01-15 DIAGNOSIS — Z87891 Personal history of nicotine dependence: Secondary | ICD-10-CM | POA: Insufficient documentation

## 2023-01-15 DIAGNOSIS — Z122 Encounter for screening for malignant neoplasm of respiratory organs: Secondary | ICD-10-CM | POA: Insufficient documentation

## 2023-01-18 NOTE — Progress Notes (Signed)
HPI: FU coronary disease. She has had a prior PCI of her diagonal. Her most recent catheterization in April of 2004 showed no obstructive disease, and her ejection fraction was normal. Abdominal ultrasound August 2020 showed no aneurysm.  Carotid Dopplers November 2023 showed 1 to 39% bilateral stenosis.  Echocardiogram December 2023 showed normal LV function, grade 2 diastolic dysfunction, mild left atrial enlargement.  Nuclear study February 2024 showed ejection fraction 71%, diaphragmatic attenuation but no ischemia.  Since I last saw her, the patient has dyspnea with more extreme activities but not with routine activities. It is relieved with rest. It is not associated with chest pain. There is no orthopnea, PND or pedal edema. There is no syncope or palpitations. There is no exertional chest pain.   Current Outpatient Medications  Medication Sig Dispense Refill   albuterol (VENTOLIN HFA) 108 (90 Base) MCG/ACT inhaler Inhale 1-2 puffs into the lungs every 6 (six) hours as needed for wheezing or shortness of breath. 1 each 0   amLODipine (NORVASC) 5 MG tablet Take 1 tablet (5 mg total) by mouth daily. 90 tablet 3   aspirin 81 MG tablet Take 81 mg by mouth daily.     blood glucose meter kit and supplies KIT Dispense based on patient and insurance preference. Use up to four times daily as directed. (FOR ICD-9 250.00, 250.01). 1 each 0   buPROPion (WELLBUTRIN XL) 150 MG 24 hr tablet Take 3 tablets (450 mg total) by mouth daily. 270 tablet 3   carvedilol (COREG) 12.5 MG tablet TAKE 1 AND 1/2 TABLETS(18.75 MG) BY MOUTH TWICE DAILY 270 tablet 3   Cholecalciferol (VITAMIN D3) 3000 units TABS Take 1 tablet by mouth daily. 30 tablet    fluticasone (FLONASE) 50 MCG/ACT nasal spray Place 2 sprays into both nostrils daily. 16 g 3   glucose blood (ONETOUCH ULTRA) test strip Use as instructed to check blood sugar once a day 100 strip 3   hydrALAZINE (APRESOLINE) 100 MG tablet Take 1 tablet (100 mg total)  by mouth 3 (three) times daily. 270 tablet 3   Iron, Ferrous Sulfate, 325 (65 Fe) MG TABS Take 325 mg by mouth every Monday, Wednesday, and Friday.     Lancet Devices (ONETOUCH DELICA PLUS LANCING) MISC Use as instructed to check blood sugar once a day 1 each 0   Lancets (ONETOUCH DELICA PLUS LANCET33G) MISC USE AS INSTRUCTED TO CHECK BLOOD SUGAR ONCE A DAY 100 each 4   metFORMIN (GLUCOPHAGE-XR) 500 MG 24 hr tablet Take 1 tablet (500 mg total) by mouth every other day. 45 tablet 3   Multiple Vitamins-Minerals (MULTIVITAMIN ADULTS 50+ PO) Take 1 tablet every morning by mouth.      olmesartan-hydrochlorothiazide (BENICAR HCT) 40-12.5 MG tablet TAKE 1 TABLET BY MOUTH DAILY 90 tablet 1   pantoprazole (PROTONIX) 40 MG tablet TAKE 1 TABLET(40 MG) BY MOUTH EVERY MORNING 90 tablet 3   polyethylene glycol (MIRALAX / GLYCOLAX) 17 g packet Take 17 g by mouth daily.     Probiotic Product (PROBIOTIC DAILY PO) Take 1 capsule by mouth every morning.     rosuvastatin (CRESTOR) 20 MG tablet TAKE 1 TABLET(20 MG) BY MOUTH DAILY 90 tablet 3   TRULICITY 0.75 MG/0.5ML SOPN ADMINISTER 0.75 MG UNDER THE SKIN 1 TIME A WEEK 2 mL 11   No current facility-administered medications for this visit.     Past Medical History:  Diagnosis Date   Acquired deformity of right foot    Back  pain    CAD (coronary artery disease) primary cardioloigst-  dr Jens Som   hx NSTEMI 10-24-2001  s/p  cardiac cath w/ PCI and DES x1 to first diagonal   Chronic constipation    Constipation    COVID-19 06/02/2020   Depression    Fatty liver 04/26/2015   GERD (gastroesophageal reflux disease)    History of abnormal cervical Pap smear    ACUS 2012   History of exercise stress test 05-25-2014  dr Jens Som   borderline (indeterminate) with non-specific ST changes (mild ST depression in leads II,III, aVF, & V6, did not meet criteria for ischemia) ;  pt had low intolerence, no cp, normal heartrate and bp response, no arrhythmias   History of  goiter    as child s/p  removal ,  per pt benign , neck area  (not thyroid)   History of heart attack    History of non-ST elevation myocardial infarction (NSTEMI) 10/24/2001   s/p  PCI and DES to first diagonal   Hyperlipidemia    Hypertension    Joint pain    OSA on CPAP pulmoloigst-  dr young   per study 11-07-2004  severe osa   Right foot pain    S/P drug eluting coronary stent placement 10/27/2001   x1 to first diagonal    Seasonal and perennial allergic rhinitis    Sleep apnea    SOB (shortness of breath)    Type 2 diabetes mellitus (HCC)    Vitamin D deficiency    Wears dentures    upper and lower partial denture   Wears glasses     Past Surgical History:  Procedure Laterality Date   BUNIONECTOMY Left 2012   BUNIONECTOMY Right 04/19/2017   Procedure: RIGHT BUNIONECTOMY;  Surgeon: Larey Dresser, DPM;  Location: [facility];  Service: Podiatry;  Laterality: Right;   CARDIAC CATHETERIZATION  09-11-2002   dr Riley Kill   well-preserved LVF; continued patency previouly placed stent in diagonal branch;  mild luminal irregularities    CARDIOVASCULAR STRESS TEST  05-16-2011   dr Jens Som   normal nuclear study w/ no evidence ischemia/  normal LV function and wall motion , ef 77%   COLONOSCOPY  06/2021   TAs, diverticulosis, int hem, rpt 3 yrs ([facility])   CORONARY ANGIOPLASTY WITH STENT PLACEMENT  10-27-2001   dr Riley Kill   high-grade stenosis first diagonal post PCI and DES (TAXUS);  mild luminal irregularity throughout LAD, RCA, and very mild CFx;  preserved LVF   goiter removal  1968   per pt benign tumor removed from neck beside throat   HALLUX VALGUS LAPIDUS Right 04/19/2017   Procedure: HALLUX VALGUS LAPIDUS FUSION;  Surgeon: Larey Dresser, DPM;  Location: [facility];  Service: Podiatry;  Laterality: Right;   HAMMER TOE SURGERY Right 04/19/2017   Procedure: HAMMER TOE CORRECTION RIGHT 2ND;  Surgeon: Larey Dresser, DPM;  Location: [facility];  Service: Podiatry;  Laterality: Right;   METATARSAL OSTEOTOMY Right 04/19/2017   Procedure: 2ND METATARSAL OSTEOTOMY;  Surgeon: Larey Dresser, DPM;  Location: [facility];  Service: Podiatry;  Laterality: Right;   TONSILLECTOMY AND ADENOIDECTOMY  age 6   TUBAL LIGATION Bilateral 1985    Social History   Socioeconomic History   Marital status: Married    Spouse name: Fayrene Fearing   Number of children: 3   Years of education: Not on file   Highest education level: Not on file  Occupational History  Occupation: Data processing manager    Comment: credit Copywriter, advertising: [employer]   Occupation: Retired  Tobacco Use   Smoking status: Former    Current packs/day: 0.00    Average packs/day: 1.5 packs/day for 48.0 years (72.0 ttl pk-yrs)    Types: Cigarettes    Start date: 09/23/1964    Quit date: 09/23/2012    Years since quitting: 10.3   Smokeless tobacco: Never  Vaping Use   Vaping status: Never Used  Substance and Sexual Activity   Alcohol use: Yes    Alcohol/week: 0.0 standard drinks of alcohol    Comment: occasional wine   Drug use: No   Sexual activity: Not Currently  Other Topics Concern   Not on file  Social History Narrative   Caffeine Use:  2 cups coffee daily   Regular exercise:  No      Social Determinants of Health   Financial Resource Strain: Low Risk  (03/01/2022)   Overall Financial Resource Strain (CARDIA)    Difficulty of Paying Living Expenses: Not hard at all  Food Insecurity: No Food Insecurity (03/01/2022)   Hunger Vital Sign    Worried About Running Out of Food in the Last Year: Never true    Ran Out of Food in the Last Year: Never true  Transportation Needs: No Transportation Needs (03/01/2022)   PRAPARE - Administrator, Civil Service (Medical): No    Lack of Transportation (Non-Medical): No  Physical Activity: Insufficiently Active (03/01/2022)   Exercise Vital Sign    Days of  Exercise per Week: 3 days    Minutes of Exercise per Session: 20 min  Stress: No Stress Concern Present (03/01/2022)   [employer] of Occupational Health - Occupational Stress Questionnaire    Feeling of Stress : Not at all  Social Connections: Socially Integrated (03/01/2022)   Social Connection and Isolation Panel [NHANES]    Frequency of Communication with Friends and Family: More than three times a week    Frequency of Social Gatherings with Friends and Family: More than three times a week    Attends Religious Services: More than 4 times per year    Active Member of [employer] or Organizations: Yes    Attends Engineer, structural: More than 4 times per year    Marital Status: Married  Catering manager Violence: Not At Risk (03/01/2022)   Humiliation, Afraid, Rape, and Kick questionnaire    Fear of Current or Ex-Partner: No    Emotionally Abused: No    Physically Abused: No    Sexually Abused: No    Family History  Problem Relation Age of Onset   Esophageal cancer Mother    Cancer Mother        throat, cervical   Diabetes Mother    Stroke Mother    Heart attack Mother    Heart disease Mother    Hypertension Mother    Thyroid disease Mother    Alcoholism Mother    Liver disease Mother    Cancer Father        prostate   Diabetes Sister    Heart attack Sister    Hypertension Sister    Hyperlipidemia Sister    Colon cancer Neg Hx    Breast cancer Neg Hx    Colon polyps Neg Hx    Stomach cancer Neg Hx    Rectal cancer Neg Hx     ROS: Back pain but no fevers or  chills, productive cough, hemoptysis, dysphasia, odynophagia, melena, hematochezia, dysuria, hematuria, rash, seizure activity, orthopnea, PND, pedal edema, claudication. Remaining systems are negative.  Physical Exam: Well-developed well-nourished in no acute distress.  Skin is warm and dry.  HEENT is normal.  Neck is supple.  Chest is clear to auscultation with normal expansion.  Cardiovascular exam  is regular rate and rhythm.  Abdominal exam nontender or distended. No masses palpated. Extremities show no edema. neuro grossly intact    A/P  1 coronary artery disease-patient denies chest pain.  Recent nuclear study showed no ischemia.  Plan to continue medical therapy with aspirin and statin.  2 hypertension-blood pressure controlled.  Continue present medications and follow-up.  3 hyperlipidemia-continue statin.  Check lipids and liver.  4 history of lower extremity edema-well-controlled with present dose of diuretic.  Will continue.  Check potassium and renal function.  Olga Millers, MD

## 2023-01-20 NOTE — Progress Notes (Unsigned)
HPI-  female former smoker followed for OSA, complicated by allergic rhinitis, CAD/MI/stent, obesity, depression, DM 2, HBP, GERD, Allergic Rhinitis,  NPSG 11/07/04 Severe OSA, AHI 45/ hr, CPAP 10.   ==============================================   01/19/22- Seen 5/11 by Clent Ridges, NP and replacement machine ordered then. Download reviewed. Good compliance and control. CT reviewed.  She follows with cardiology for ASCVD. CT chest low dose screen 01/03/22- stable nodules noted IMPRESSION: 1. Lung-RADS 2, benign appearance or behavior. Continue annual screening with low-dose chest CT without contrast in 12 months. 2. Severe three-vessel coronary artery calcifications. 3. Aortic Atherosclerosis (ICD10-I70.0) and Emphysema (ICD10-J43.9).  01/22/23- 72 year old female former smoker (50 pkyrs) followed for OSA, RLL NOdule, complicated by allergic rhinitis, CAD/MI/stent, obesity, depression, DM 2, HBP, GERD,  CPAP 5-15/ [facility]     AirSense 11 AutoSet Download compliance-97%, AHI 2.9/ hr Body weight today-182 lbs -----Wearing CPAP-leaking around mask at times Download reviewed. Will ask mask refit, but otherwise does well with CPAP. - Lung nodule discussed. Understands recommendation for f/u CT. We will also get PFT. Breathing is comfortable. CT chest Low dose screening 01/15/23-   NOTE IMPRESSION: 1. New irregular subpleural nodular opacity of the right lower lobe measuring 6.3 mm, short-term follow-up is recommended. Lung-RADS 4A, suspicious. Follow up low-dose chest CT without contrast in 3 months (please use the following order, CT CHEST LCS NODULE FOLLOW-UP W/OCM) is recommended. Alternatively, PET may be considered when there is a solid component 8mm or larger. 2. Coronary artery calcifications, aortic Atherosclerosis (ICD10-I70.0) and Emphysema (ICD10-J43.9).   ROS-see HPI   + = positive Constitutional:    weight loss, night sweats, fevers, chills, +fatigue, lassitude. HEENT:     headaches, difficulty swallowing, tooth/dental problems, sore throat,       sneezing, itching, ear ache, nasal congestion, post nasal drip, snoring CV:    chest pain, orthopnea, PND, swelling in lower extremities, anasarca,                                   dizziness, palpitations Resp:   shortness of breath with exertion or at rest.                productive cough,   non-productive cough, coughing up of blood.              change in color of mucus.  wheezing.   Skin:    rash or lesions. GI: + heartburn, indigestion, +abdominal pain, nausea, vomiting, diarrhea,                 change in bowel habits, loss of appetite GU: dysuria, change in color of urine, no urgency or frequency.   flank pain. MS:   +joint pain, stiffness, decreased range of motion, back pain. Neuro-     nothing unusual Psych:  change in mood or affect.  +depression or anxiety.   memory loss.  OBJ- Physical Exam General- Alert, Oriented, Affect-appropriate, Distress- none acute, + obese Skin- rash-none, lesions- none, excoriation- none Lymphadenopathy- none Head- atraumatic            Eyes- Gross vision intact, PERRLA, conjunctivae and secretions clear            Ears- Hearing, canals-normal            Nose- Clear, no-Septal dev, mucus, polyps, erosion, perforation             Throat- Mallampati IV , mucosa clear ,  drainage- none, tonsils- atrophic Neck- flexible , trachea midline, no stridor , thyroid nl, carotid no bruit Chest - symmetrical excursion , unlabored           Heart/CV- RRR , no murmur , no gallop  , no rub, nl s1 s2                           - JVD- none , edema- none, stasis changes- none, varices- none           Lung- +coarse in bases, wheeze- none, cough- none , dullness-none, rub- none           Chest wall-  Abd-  Br/ Gen/ Rectal- Not done, not indicated Extrem- +superficial varices-legs Neuro- grossly intact to observation

## 2023-01-22 ENCOUNTER — Ambulatory Visit: Payer: PPO | Admitting: Internal Medicine

## 2023-01-22 ENCOUNTER — Encounter: Payer: Self-pay | Admitting: Internal Medicine

## 2023-01-22 VITALS — BP 112/62 | HR 64 | Temp 97.9°F | Ht 63.0 in | Wt 182.2 lb

## 2023-01-22 DIAGNOSIS — G4733 Obstructive sleep apnea (adult) (pediatric): Secondary | ICD-10-CM | POA: Diagnosis not present

## 2023-01-22 DIAGNOSIS — R911 Solitary pulmonary nodule: Secondary | ICD-10-CM

## 2023-01-22 NOTE — Patient Instructions (Signed)
Order- schedule in 3 months CT chest w/o contrast LCS Nodule F/U  Order- schedule PFT    dx Lung nodule  Order- DME [employer]- please refit CPAP mask of choice for better fit and seal. Continue auto 5-15.

## 2023-01-22 NOTE — Assessment & Plan Note (Signed)
Benefits from CPAP with good compliance and control.  Plan- Continue auto 5-15. Refit mask

## 2023-01-22 NOTE — Assessment & Plan Note (Signed)
New and concerning due to extensive smoking hx. Plan- Repeat CT chest in 3 months. PFT

## 2023-01-23 ENCOUNTER — Encounter: Payer: Self-pay | Admitting: Internal Medicine

## 2023-01-24 ENCOUNTER — Telehealth: Payer: Self-pay | Admitting: Acute Care

## 2023-01-24 ENCOUNTER — Encounter (INDEPENDENT_AMBULATORY_CARE_PROVIDER_SITE_OTHER): Payer: Self-pay

## 2023-01-24 ENCOUNTER — Other Ambulatory Visit: Payer: Self-pay | Admitting: Emergency Medicine

## 2023-01-24 DIAGNOSIS — Z87891 Personal history of nicotine dependence: Secondary | ICD-10-CM

## 2023-01-24 DIAGNOSIS — R911 Solitary pulmonary nodule: Secondary | ICD-10-CM

## 2023-01-24 NOTE — Telephone Encounter (Signed)
Called and spoke to patient. Schedule patient her 3 month follow up scan for 11/13 at [facility]. Results and plan sent to PCP.

## 2023-01-24 NOTE — Telephone Encounter (Signed)
I have called the patient with the results of her low-dose screening CT.  I explained that she had a new irregular subpleural nodular opacity in the right lower lobe that measures 6.3 mm.  Recommendation is a follow-up low-dose screening CT in 3 months.  This scan will be due after April 20, 2023 and at patient request prior to her follow-up with Dr. Jetty Duhamel on May 02, 2023. We will schedule her follow-up at the outpatient imaging center in [facility] between the dates of April 20, 2023 and May 02, 2023. She has follow-up with Dr. Maple Hudson however we will also continue to follow her through the screening program. Sherre Lain, and Robynn Pane please fax results to PCP and let them know plan is for 55-month follow-up low-dose screening CT. I did discuss the notation of coronary artery disease and atherosclerosis.  Patient is on statin medication and has follow-up with cardiology next month.

## 2023-01-28 ENCOUNTER — Ambulatory Visit: Payer: PPO | Attending: Cardiology | Admitting: Cardiology

## 2023-01-28 ENCOUNTER — Encounter: Payer: Self-pay | Admitting: Cardiology

## 2023-01-28 VITALS — BP 128/60 | HR 86 | Ht 63.0 in | Wt 185.6 lb

## 2023-01-28 DIAGNOSIS — E785 Hyperlipidemia, unspecified: Secondary | ICD-10-CM | POA: Diagnosis not present

## 2023-01-28 DIAGNOSIS — I2510 Atherosclerotic heart disease of native coronary artery without angina pectoris: Secondary | ICD-10-CM | POA: Diagnosis not present

## 2023-01-28 DIAGNOSIS — I10 Essential (primary) hypertension: Secondary | ICD-10-CM

## 2023-01-28 NOTE — Patient Instructions (Signed)

## 2023-01-29 LAB — LIPID PANEL
Chol/HDL Ratio: 2.4 ratio (ref 0.0–4.4)
Cholesterol, Total: 119 mg/dL (ref 100–199)
HDL: 50 mg/dL (ref >39)
LDL Chol Calc (NIH): 38 mg/dL (ref 0–99)
Triglycerides: 196 mg/dL — ABNORMAL HIGH (ref 0–149)
VLDL Cholesterol Cal: 31 mg/dL (ref 5–40)

## 2023-01-29 LAB — COMPREHENSIVE METABOLIC PANEL
ALT: 23 IU/L (ref 0–32)
AST: 19 IU/L (ref 0–40)
Albumin: 4.2 g/dL (ref 3.8–4.8)
Alkaline Phosphatase: 65 IU/L (ref 44–121)
BUN/Creatinine Ratio: 19 (ref 12–28)
BUN: 18 mg/dL (ref 8–27)
Bilirubin Total: 0.4 mg/dL (ref 0.0–1.2)
CO2: 26 mmol/L (ref 20–29)
Calcium: 9.4 mg/dL (ref 8.7–10.3)
Chloride: 102 mmol/L (ref 96–106)
Creatinine, Ser: 0.93 mg/dL (ref 0.57–1.00)
Globulin, Total: 2.4 g/dL (ref 1.5–4.5)
Glucose: 178 mg/dL — ABNORMAL HIGH (ref 70–99)
Potassium: 3.9 mmol/L (ref 3.5–5.2)
Sodium: 142 mmol/L (ref 134–144)
Total Protein: 6.6 g/dL (ref 6.0–8.5)
eGFR: 65 mL/min/{1.73_m2} (ref >59)

## 2023-01-30 ENCOUNTER — Ambulatory Visit (HOSPITAL_COMMUNITY): Payer: PPO

## 2023-01-30 ENCOUNTER — Ambulatory Visit: Payer: PPO | Admitting: Dietician

## 2023-02-12 DIAGNOSIS — E119 Type 2 diabetes mellitus without complications: Secondary | ICD-10-CM | POA: Diagnosis not present

## 2023-02-12 DIAGNOSIS — H5203 Hypermetropia, bilateral: Secondary | ICD-10-CM | POA: Diagnosis not present

## 2023-02-12 DIAGNOSIS — H52223 Regular astigmatism, bilateral: Secondary | ICD-10-CM | POA: Diagnosis not present

## 2023-02-12 DIAGNOSIS — H524 Presbyopia: Secondary | ICD-10-CM | POA: Diagnosis not present

## 2023-02-12 LAB — HM DIABETES EYE EXAM

## 2023-02-13 ENCOUNTER — Encounter: Payer: Self-pay | Admitting: Family Medicine

## 2023-02-13 DIAGNOSIS — L738 Other specified follicular disorders: Secondary | ICD-10-CM | POA: Diagnosis not present

## 2023-02-13 DIAGNOSIS — L218 Other seborrheic dermatitis: Secondary | ICD-10-CM | POA: Diagnosis not present

## 2023-02-21 ENCOUNTER — Other Ambulatory Visit: Payer: Self-pay | Admitting: Family Medicine

## 2023-02-21 DIAGNOSIS — E559 Vitamin D deficiency, unspecified: Secondary | ICD-10-CM

## 2023-02-21 DIAGNOSIS — E1169 Type 2 diabetes mellitus with other specified complication: Secondary | ICD-10-CM

## 2023-02-21 DIAGNOSIS — D649 Anemia, unspecified: Secondary | ICD-10-CM

## 2023-02-25 ENCOUNTER — Other Ambulatory Visit (INDEPENDENT_AMBULATORY_CARE_PROVIDER_SITE_OTHER): Payer: PPO

## 2023-02-25 DIAGNOSIS — E559 Vitamin D deficiency, unspecified: Secondary | ICD-10-CM | POA: Diagnosis not present

## 2023-02-25 DIAGNOSIS — E1169 Type 2 diabetes mellitus with other specified complication: Secondary | ICD-10-CM

## 2023-02-25 DIAGNOSIS — D649 Anemia, unspecified: Secondary | ICD-10-CM

## 2023-02-25 LAB — HEMOGLOBIN A1C: Hgb A1c MFr Bld: 6 % (ref 4.6–6.5)

## 2023-02-25 LAB — IBC PANEL
Iron: 73 ug/dL (ref 42–145)
Saturation Ratios: 22.5 % (ref 20.0–50.0)
TIBC: 324.8 ug/dL (ref 250.0–450.0)
Transferrin: 232 mg/dL (ref 212.0–360.0)

## 2023-02-25 LAB — CBC WITH DIFFERENTIAL/PLATELET
Basophils Absolute: 0 10*3/uL (ref 0.0–0.1)
Basophils Relative: 1 % (ref 0.0–3.0)
Eosinophils Absolute: 0.2 10*3/uL (ref 0.0–0.7)
Eosinophils Relative: 3.8 % (ref 0.0–5.0)
HCT: 35.2 % — ABNORMAL LOW (ref 36.0–46.0)
Hemoglobin: 11.6 g/dL — ABNORMAL LOW (ref 12.0–15.0)
Lymphocytes Relative: 24.4 % (ref 12.0–46.0)
Lymphs Abs: 1 10*3/uL (ref 0.7–4.0)
MCHC: 33 g/dL (ref 30.0–36.0)
MCV: 87.9 fl (ref 78.0–100.0)
Monocytes Absolute: 0.3 10*3/uL (ref 0.1–1.0)
Monocytes Relative: 7.4 % (ref 3.0–12.0)
Neutro Abs: 2.7 10*3/uL (ref 1.4–7.7)
Neutrophils Relative %: 63.4 % (ref 43.0–77.0)
Platelets: 139 10*3/uL — ABNORMAL LOW (ref 150.0–400.0)
RBC: 4 Mil/uL (ref 3.87–5.11)
RDW: 15.6 % — ABNORMAL HIGH (ref 11.5–15.5)
WBC: 4.2 10*3/uL (ref 4.0–10.5)

## 2023-02-25 LAB — MICROALBUMIN / CREATININE URINE RATIO
Creatinine,U: 63.4 mg/dL
Microalb Creat Ratio: 4.6 mg/g (ref 0.0–30.0)
Microalb, Ur: 2.9 mg/dL — ABNORMAL HIGH (ref 0.0–1.9)

## 2023-02-25 LAB — FERRITIN: Ferritin: 46.1 ng/mL (ref 10.0–291.0)

## 2023-02-25 LAB — VITAMIN D 25 HYDROXY (VIT D DEFICIENCY, FRACTURES): VITD: 57.5 ng/mL (ref 30.00–100.00)

## 2023-03-04 ENCOUNTER — Encounter: Payer: Self-pay | Admitting: *Deleted

## 2023-03-04 ENCOUNTER — Encounter: Payer: Self-pay | Admitting: Family Medicine

## 2023-03-04 ENCOUNTER — Ambulatory Visit: Payer: PPO | Admitting: Family Medicine

## 2023-03-04 VITALS — BP 124/64 | HR 70 | Temp 97.6°F | Ht 62.0 in | Wt 179.5 lb

## 2023-03-04 DIAGNOSIS — Z23 Encounter for immunization: Secondary | ICD-10-CM | POA: Diagnosis not present

## 2023-03-04 DIAGNOSIS — K219 Gastro-esophageal reflux disease without esophagitis: Secondary | ICD-10-CM

## 2023-03-04 DIAGNOSIS — J329 Chronic sinusitis, unspecified: Secondary | ICD-10-CM | POA: Diagnosis not present

## 2023-03-04 DIAGNOSIS — I10 Essential (primary) hypertension: Secondary | ICD-10-CM

## 2023-03-04 DIAGNOSIS — K760 Fatty (change of) liver, not elsewhere classified: Secondary | ICD-10-CM

## 2023-03-04 DIAGNOSIS — F331 Major depressive disorder, recurrent, moderate: Secondary | ICD-10-CM

## 2023-03-04 DIAGNOSIS — D696 Thrombocytopenia, unspecified: Secondary | ICD-10-CM

## 2023-03-04 DIAGNOSIS — H9193 Unspecified hearing loss, bilateral: Secondary | ICD-10-CM

## 2023-03-04 DIAGNOSIS — E669 Obesity, unspecified: Secondary | ICD-10-CM

## 2023-03-04 DIAGNOSIS — M85851 Other specified disorders of bone density and structure, right thigh: Secondary | ICD-10-CM

## 2023-03-04 DIAGNOSIS — Z0000 Encounter for general adult medical examination without abnormal findings: Secondary | ICD-10-CM | POA: Diagnosis not present

## 2023-03-04 DIAGNOSIS — E1169 Type 2 diabetes mellitus with other specified complication: Secondary | ICD-10-CM | POA: Diagnosis not present

## 2023-03-04 DIAGNOSIS — G4733 Obstructive sleep apnea (adult) (pediatric): Secondary | ICD-10-CM | POA: Diagnosis not present

## 2023-03-04 DIAGNOSIS — D649 Anemia, unspecified: Secondary | ICD-10-CM

## 2023-03-04 DIAGNOSIS — E559 Vitamin D deficiency, unspecified: Secondary | ICD-10-CM

## 2023-03-04 DIAGNOSIS — Z7189 Other specified counseling: Secondary | ICD-10-CM | POA: Diagnosis not present

## 2023-03-04 DIAGNOSIS — E785 Hyperlipidemia, unspecified: Secondary | ICD-10-CM

## 2023-03-04 DIAGNOSIS — E118 Type 2 diabetes mellitus with unspecified complications: Secondary | ICD-10-CM | POA: Diagnosis not present

## 2023-03-04 MED ORDER — METFORMIN HCL ER 500 MG PO TB24
500.0000 mg | ORAL_TABLET | ORAL | 4 refills | Status: DC
Start: 2023-03-04 — End: 2024-05-04

## 2023-03-04 MED ORDER — RA CALCIUM 600/VIT D/MINERALS 600-400 MG-UNIT PO CHEW: 1.0000 | CHEWABLE_TABLET | Freq: Every day | ORAL | Status: AC

## 2023-03-04 MED ORDER — FLUTICASONE PROPIONATE 50 MCG/ACT NA SUSP: 2.0000 | Freq: Every day | NASAL | 12 refills | Status: AC

## 2023-03-04 MED ORDER — LINACLOTIDE 145 MCG PO CAPS: 145.0000 ug | ORAL_CAPSULE | Freq: Every day | ORAL | 6 refills | Status: DC

## 2023-03-04 MED ORDER — TRULICITY 0.75 MG/0.5ML ~~LOC~~ SOAJ
0.7500 mg | SUBCUTANEOUS | 11 refills | Status: DC
Start: 2023-03-04 — End: 2024-05-04

## 2023-03-04 MED ORDER — PANTOPRAZOLE SODIUM 40 MG PO TBEC
40.0000 mg | DELAYED_RELEASE_TABLET | Freq: Every day | ORAL | 4 refills | Status: AC
Start: 2023-03-04 — End: ?

## 2023-03-04 NOTE — Assessment & Plan Note (Addendum)
Noted over the past 1-2 yrs. She is on aspirin for 20 yrs. ?ITP

## 2023-03-04 NOTE — Assessment & Plan Note (Signed)
Chronic, stable on crestor 20mg  daily through cardiology.  The ASCVD Risk score (Arnett DK, et al., 2019) failed to calculate for the following reasons:   The valid total cholesterol range is 130 to 320 mg/dL

## 2023-03-04 NOTE — Assessment & Plan Note (Signed)
On CPAP through pulm.

## 2023-03-04 NOTE — Assessment & Plan Note (Signed)
Advanced directive discussion - does not have at home - packet provided again today. Daughter Berta Minor would be HCPOA.

## 2023-03-04 NOTE — Assessment & Plan Note (Signed)
Chronic, stable. Continue current regimen.

## 2023-03-04 NOTE — Assessment & Plan Note (Signed)
Reviewed vit D intake as well as calcium intake - predominantly from diet.

## 2023-03-04 NOTE — Assessment & Plan Note (Signed)
Difficulty with hearing exam today - she endorses difficulty hearing as well. Requests formal audiology evaluation in [place].

## 2023-03-04 NOTE — Progress Notes (Addendum)
Ph: [phone] Fax: [phone]   Patient ID: [name] [name], female    DOB: 07/02/1950, 72 y.o.   MRN: [id number]  This visit was conducted in person.  BP 124/64   Pulse 70   Temp 97.6 F (36.4 C) (Temporal)   Ht 5\' 2"  (1.575 m)   Wt 179 lb 8 oz (81.4 kg)   SpO2 96%   BMI 32.83 kg/m    CC: AMW/CPE Subjective:   HPI: [name] [name] is a 72 y.o. female presenting on 03/04/2023 for Medicare Wellness   Did not see health advisor this year   Hearing Screening   500Hz  1000Hz  2000Hz  4000Hz   Right ear 40 0 40 0  Left ear 40 0 40 0  Comments: States she has to have volume elevated on TV.   Vision Screening - Comments:: Last eye exam, 02/2023.   Flowsheet Row Office Visit from 03/04/2023 in [facility] [place]  PHQ-2 Total Score 1          03/04/2023   11:06 AM 08/31/2022   11:45 AM 03/01/2022   11:59 AM 03/17/2020    9:11 AM 02/10/2020    9:05 AM  Fall Risk   Falls in the past year? 1 1 0 0 0  Number falls in past yr: 1 1   0  Injury with Fall? 1 1   0  Risk for fall due to :     Medication side effect  Follow up     Falls evaluation completed;Falls prevention discussed  2-3 falls this past year - one had elbow injury saw ortho for this.   OSA on CPAP - manage by pulm  Pulm nodule followed by pulm - new irregular 6.29mm nodule, planned rpt CT 3 months.   CAD sees cardiology Jens Som) - recent nuclear study showed no ischemia.   DM - managed with metformin XR 500mg  daily as well as trulicity 0.75mg  weekly.  Chronic IBS/CIC - amitiza bid didn't really help. She's stopped miralax/probiotic.   Depression followed by psych Dr Jennelle Human on wellbutrin XL 450mg  daily.    Preventative: Colonoscopy - [facility] rpt 10 yrs Jarold Motto) 05/2011  Colonoscopy 06/2021 - 3 TAs, diverticulosis, in hem , rpt 3 yrs Leonides Schanz) [facility]  Well woman exam - yearly through [facility] Dr Mitzi Hansen, normal paps in the past DEXA scan  - WNL 07/2018 DEXA 09/2022 - T -1.5 RFN (osteopenia), not at increased fracture risk  Lung cancer screening - yearly, see above. Imaging showed severe 3v CAC, aortic ATH, and emphysema.  Flu shot - yearly COVID shot - Pfizer 08/2019, 09/2019, no booster Tdap 2012  Prevnar-13 2016, Pneumovax-23 2012, 2018 Shingrix - discussed, not interested. Has not had chicken pox.  Advanced directive discussion - does not have at home - packet provided again today. Daughter Berta Minor would be HCPOA.  Seat belt use discussed Sunscreen use discussed. No changing moles. Saw derm in [place] s/p BCC removal. Dentist - q6 mo  Eye exam - yearly  Ex smoker - quit 2015. 40+ PY hx.  Alcohol  - rare  Bowel - constipation - no longer on amitiza, didn't really help. Has tried miralax and probiotic, continues this. Would like to try daily linzess.  Bladder -  no incontinence   Lives with husband  Occ: retired, 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  Activity: no regular activity  Diet: good water, fruits/vegetables daily  Relevant past medical, surgical, family and social history reviewed and updated as indicated. Interim medical history since our last visit reviewed. Allergies and medications reviewed and updated. Outpatient Medications Prior to Visit  Medication Sig Dispense Refill   albuterol (VENTOLIN HFA) 108 (90 Base) MCG/ACT inhaler Inhale 1-2 puffs into the lungs every 6 (six) hours as needed for wheezing or shortness of breath. 1 each 0   amLODipine (NORVASC) 5 MG tablet Take 1 tablet (5 mg total) by mouth daily. 90 tablet 3   aspirin 81 MG tablet Take 81 mg by mouth daily.     blood glucose meter kit and supplies KIT Dispense based on patient and insurance preference. Use up to four times daily as directed. (FOR ICD-9 250.00, 250.01). 1 each 0   buPROPion (WELLBUTRIN XL) 150 MG 24 hr tablet Take 3 tablets (450 mg total) by mouth daily. 270 tablet 3   carvedilol (COREG) 12.5 MG tablet TAKE 1 AND 1/2  TABLETS(18.75 MG) BY MOUTH TWICE DAILY 270 tablet 3   Cholecalciferol (VITAMIN D3) 3000 units TABS Take 1 tablet by mouth daily. 30 tablet    glucose blood (ONETOUCH ULTRA) test strip Use as instructed to check blood sugar once a day 100 strip 3   hydrALAZINE (APRESOLINE) 100 MG tablet Take 1 tablet (100 mg total) by mouth 3 (three) times daily. 270 tablet 3   hydrocortisone 2.5 % cream Apply topically 2 (two) times daily.     Iron, Ferrous Sulfate, 325 (65 Fe) MG TABS Take 325 mg by mouth every Monday, Wednesday, and Friday.     ketoconazole (NIZORAL) 2 % cream Apply topically daily.     ketoconazole (NIZORAL) 2 % shampoo Apply topically 3 (three) times a week.     Lancets (ONETOUCH DELICA PLUS LANCET33G) MISC USE AS INSTRUCTED TO CHECK BLOOD SUGAR ONCE A DAY 100 each 4   Multiple Vitamins-Minerals (MULTIVITAMIN ADULTS 50+ PO) Take 1 tablet every morning by mouth.      olmesartan-hydrochlorothiazide (BENICAR HCT) 40-12.5 MG tablet TAKE 1 TABLET BY MOUTH DAILY 90 tablet 1   polyethylene glycol (MIRALAX / GLYCOLAX) 17 g packet Take 17 g by mouth daily.     Probiotic Product (PROBIOTIC DAILY PO) Take 1 capsule by mouth every morning.     rosuvastatin (CRESTOR) 20 MG tablet TAKE 1 TABLET(20 MG) BY MOUTH DAILY 90 tablet 3   fluticasone (FLONASE) 50 MCG/ACT nasal spray Place 2 sprays into both nostrils daily. 16 g 3   Lancet Devices (ONETOUCH DELICA PLUS LANCING) MISC Use as instructed to check blood sugar once a day 1 each 0   metFORMIN (GLUCOPHAGE-XR) 500 MG 24 hr tablet Take 1 tablet (500 mg total) by mouth every other day. 45 tablet 3   pantoprazole (PROTONIX) 40 MG tablet TAKE 1 TABLET(40 MG) BY MOUTH EVERY MORNING 90 tablet 3   TRULICITY 0.75 MG/0.5ML SOPN ADMINISTER 0.75 MG UNDER THE SKIN 1 TIME A WEEK 2 mL 11   No facility-administered medications prior to visit.     Per HPI unless specifically indicated in ROS section below Review of Systems  Constitutional:  Negative for activity  change, appetite change, chills, fatigue, fever and unexpected weight change.  HENT:  Negative for hearing loss.   Eyes:  Negative for visual disturbance.  Respiratory:  Negative for cough, chest tightness, shortness of breath and wheezing.   Cardiovascular:  Positive for leg swelling (left chronic). Negative for chest pain and palpitations.  Gastrointestinal:  Positive for constipation. Negative for abdominal  distention, abdominal pain, blood in stool, diarrhea, nausea and vomiting.  Genitourinary:  Negative for difficulty urinating and hematuria.  Musculoskeletal:  Negative for arthralgias, myalgias and neck pain.  Skin:  Negative for rash.  Neurological:  Negative for dizziness, seizures, syncope and headaches.  Hematological:  Negative for adenopathy. Bruises/bleeds easily.  Psychiatric/Behavioral:  Positive for dysphoric mood. The patient is not nervous/anxious.     Objective:  BP 124/64   Pulse 70   Temp 97.6 F (36.4 C) (Temporal)   Ht 5\' 2"  (1.575 m)   Wt 179 lb 8 oz (81.4 kg)   SpO2 96%   BMI 32.83 kg/m   Wt Readings from Last 3 Encounters:  03/04/23 179 lb 8 oz (81.4 kg)  01/28/23 185 lb 9.6 oz (84.2 kg)  01/22/23 182 lb 3.2 oz (82.6 kg)      Physical Exam Vitals and nursing note reviewed.  Constitutional:      Appearance: Normal appearance. She is not ill-appearing.  HENT:     Head: Normocephalic and atraumatic.     Right Ear: Tympanic membrane, ear canal and external ear normal. There is no impacted cerumen.     Left Ear: Tympanic membrane, ear canal and external ear normal. There is no impacted cerumen.     Mouth/Throat:     Mouth: Mucous membranes are moist.     Pharynx: Oropharynx is clear. No oropharyngeal exudate or posterior oropharyngeal erythema.  Eyes:     General:        Right eye: No discharge.        Left eye: No discharge.     Extraocular Movements: Extraocular movements intact.     Conjunctiva/sclera: Conjunctivae normal.     Pupils: Pupils are  equal, round, and reactive to light.  Neck:     Thyroid: No thyroid mass or thyromegaly.     Vascular: No carotid bruit.  Cardiovascular:     Rate and Rhythm: Normal rate and regular rhythm.     Pulses: Normal pulses.     Heart sounds: Normal heart sounds. No murmur heard. Pulmonary:     Effort: Pulmonary effort is normal. No respiratory distress.     Breath sounds: Normal breath sounds. No wheezing, rhonchi or rales.  Abdominal:     General: Bowel sounds are normal. There is no distension.     Palpations: Abdomen is soft. There is no mass.     Tenderness: There is no abdominal tenderness. There is no guarding or rebound.     Hernia: No hernia is present.  Musculoskeletal:     Cervical back: Normal range of motion and neck supple. No rigidity.     Right lower leg: No edema.     Left lower leg: No edema.  Lymphadenopathy:     Cervical: No cervical adenopathy.  Skin:    General: Skin is warm and dry.     Findings: No rash.  Neurological:     General: No focal deficit present.     Mental Status: She is alert. Mental status is at baseline.     Comments:  Recall 3/3 Calculation 5/5 DLROW  Psychiatric:        Mood and Affect: Mood normal.        Behavior: Behavior normal.       Results for orders placed or performed in visit on 02/25/23  Ferritin  Result Value Ref Range   Ferritin 46.1 10.0 - 291.0 ng/mL  IBC panel  Result Value Ref Range  Iron 73 42 - 145 ug/dL   Transferrin 841.3 244.0 - 360.0 mg/dL   Saturation Ratios 10.2 20.0 - 50.0 %   TIBC 324.8 250.0 - 450.0 mcg/dL  CBC with Differential/Platelet  Result Value Ref Range   WBC 4.2 4.0 - 10.5 K/uL   RBC 4.00 3.87 - 5.11 Mil/uL   Hemoglobin 11.6 (L) 12.0 - 15.0 g/dL   HCT 72.5 (L) 36.6 - 44.0 %   MCV 87.9 78.0 - 100.0 fl   MCHC 33.0 30.0 - 36.0 g/dL   RDW 34.7 (H) 42.5 - 95.6 %   Platelets 139.0 (L) 150.0 - 400.0 K/uL   Neutrophils Relative % 63.4 43.0 - 77.0 %   Lymphocytes Relative 24.4 12.0 - 46.0 %    Monocytes Relative 7.4 3.0 - 12.0 %   Eosinophils Relative 3.8 0.0 - 5.0 %   Basophils Relative 1.0 0.0 - 3.0 %   Neutro Abs 2.7 1.4 - 7.7 K/uL   Lymphs Abs 1.0 0.7 - 4.0 K/uL   Monocytes Absolute 0.3 0.1 - 1.0 K/uL   Eosinophils Absolute 0.2 0.0 - 0.7 K/uL   Basophils Absolute 0.0 0.0 - 0.1 K/uL  VITAMIN D 25 Hydroxy (Vit-D Deficiency, Fractures)  Result Value Ref Range   VITD 57.50 30.00 - 100.00 ng/mL  Microalbumin / creatinine urine ratio  Result Value Ref Range   Microalb, Ur 2.9 (H) 0.0 - 1.9 mg/dL   Creatinine,U 38.7 mg/dL   Microalb Creat Ratio 4.6 0.0 - 30.0 mg/g  Hemoglobin A1c  Result Value Ref Range   Hgb A1c MFr Bld 6.0 4.6 - 6.5 %    Assessment & Plan:   Problem List Items Addressed This Visit     Health maintenance examination (Chronic)    Preventative protocols reviewed and updated unless pt declined. Discussed healthy diet and lifestyle.       Medicare annual wellness visit, subsequent - Primary (Chronic)    I have personally reviewed the Medicare Annual Wellness questionnaire and have noted 1. The patient's medical and social history 2. Their use of alcohol, tobacco or illicit drugs 3. Their current medications and supplements 4. The patient's functional ability including ADL's, fall risks, home safety risks and hearing or visual impairment. Cognitive function has been assessed and addressed as indicated.  5. Diet and physical activity 6. Evidence for depression or mood disorders The patients weight, height, BMI have been recorded in the chart. I have made referrals, counseling and provided education to the patient based on review of the above and I have provided the pt with a written personalized care plan for preventive services. Provider list updated.. See scanned questionairre as needed for further documentation. Reviewed preventative protocols and updated unless pt declined.       Advanced directives, counseling/discussion (Chronic)    Advanced  directive discussion - does not have at home - packet provided again today. Daughter Berta Minor would be HCPOA.       Type 2 diabetes mellitus with other specified complication (HCC)    Chronic, stable on current regimen - continue.       Relevant Medications   metFORMIN (GLUCOPHAGE-XR) 500 MG 24 hr tablet   Dulaglutide (TRULICITY) 0.75 MG/0.5ML SOPN   Hyperlipidemia associated with type 2 diabetes mellitus (HCC)    Chronic, stable on crestor 20mg  daily through cardiology.  The ASCVD Risk score (Arnett DK, et al., 2019) failed to calculate for the following reasons:   The valid total cholesterol range is 130 to 320 mg/dL  Relevant Medications   metFORMIN (GLUCOPHAGE-XR) 500 MG 24 hr tablet   Dulaglutide (TRULICITY) 0.75 MG/0.5ML SOPN   MDD (major depressive disorder), recurrent episode, moderate (HCC)    Chronic, appreciate psychiatry Dr Jennelle Human care.       OBSTRUCTIVE SLEEP APNEA    On CPAP through pulm.       Essential hypertension, benign    Chronic, stable. Continue current regimen.       GERD    Chronic, stable on daily pantoprazole - continue this.       Relevant Medications   pantoprazole (PROTONIX) 40 MG tablet   linaclotide (LINZESS) 145 MCG CAPS capsule   Osteopenia    Reviewed vit D intake as well as calcium intake - predominantly from diet.       Obesity, Class I, BMI 30-34.9    Encourage healthy diet and lifestyle choices to affect sustainable weight loss.       NAFLD (nonalcoholic fatty liver disease)   Vitamin D deficiency    Continue daily vit D intake.      Anemia    Chronic, iron levels normal - continue oral iron.  UTD colonoscopy (06/2021). UA, iFOB normal last year.  Continue to monitor.       Decreased hearing of both ears    Difficulty with hearing exam today - she endorses difficulty hearing as well. Requests formal audiology evaluation in [place].      Relevant Orders   Ambulatory referral to Audiology    Thrombocytopenia [facility])    Noted over the past 1-2 yrs. She is on aspirin for 20 yrs. ?ITP      Other Visit Diagnoses     Encounter for immunization       Relevant Orders   Flu Vaccine Trivalent High Dose (Fluad) (Completed)   Sinusitis, unspecified chronicity, unspecified location       Relevant Medications   fluticasone (FLONASE) 50 MCG/ACT nasal spray   Controlled type 2 diabetes mellitus with complication, without long-term current use of insulin (HCC)       Relevant Medications   metFORMIN (GLUCOPHAGE-XR) 500 MG 24 hr tablet   Dulaglutide (TRULICITY) 0.75 MG/0.5ML SOPN        Meds ordered this encounter  Medications   fluticasone (FLONASE) 50 MCG/ACT nasal spray    Sig: Place 2 sprays into both nostrils daily.    Dispense:  16 g    Refill:  12   pantoprazole (PROTONIX) 40 MG tablet    Sig: Take 1 tablet (40 mg total) by mouth daily.    Dispense:  90 tablet    Refill:  4   Calcium Carbonate-Vit D-Min (RA CALCIUM 600/VIT D/MINERALS) 600-400 MG-UNIT CHEW    Sig: Chew 1 tablet by mouth daily.   metFORMIN (GLUCOPHAGE-XR) 500 MG 24 hr tablet    Sig: Take 1 tablet (500 mg total) by mouth every other day.    Dispense:  45 tablet    Refill:  4   Dulaglutide (TRULICITY) 0.75 MG/0.5ML SOPN    Sig: Inject 0.75 mg into the skin once a week.    Dispense:  2 mL    Refill:  11   linaclotide (LINZESS) 145 MCG CAPS capsule    Sig: Take 1 capsule (145 mcg total) by mouth daily before breakfast.    Dispense:  30 capsule    Refill:  6    Orders Placed This Encounter  Procedures   Flu Vaccine Trivalent High Dose (Fluad)   Ambulatory referral to Audiology  Referral Priority:   Routine    Referral Type:   Audiology Exam    Referral Reason:   Specialty Services Required    Number of Visits Requested:   1    Patient Instructions  Flu shot today  Goal 1200mg  calcium/day - try to get most from the diet.  To figure out dietary calcium: 300 mg/day from all non dairy foods plus  300 mg per cup of milk, other dairy, or fortified juice. Non dairy foods that contain calcium: Kale, oranges, sardines, oatmeal, soy milk/soybeans, salmon, white beans, dried figs, turnip greens, almonds, broccoli, tofu.   If interested, check with pharmacy about new 2 shot shingles series (shingrix).  Try daily Linzess for constipation  Advanced directive packet provided today.   Follow up plan: Return in about 6 months (around 09/01/2023), or if symptoms worsen or fail to improve, for follow up visit.  Eustaquio Boyden, MD

## 2023-03-04 NOTE — Patient Instructions (Addendum)
Flu shot today  Goal 1200mg  calcium/day - try to get most from the diet.  To figure out dietary calcium: 300 mg/day from all non dairy foods plus 300 mg per cup of milk, other dairy, or fortified juice. Non dairy foods that contain calcium: Kale, oranges, sardines, oatmeal, soy milk/soybeans, salmon, white beans, dried figs, turnip greens, almonds, broccoli, tofu.   If interested, check with pharmacy about new 2 shot shingles series (shingrix).  Try daily Linzess for constipation  Advanced directive packet provided today.

## 2023-03-04 NOTE — Assessment & Plan Note (Signed)

## 2023-03-04 NOTE — Assessment & Plan Note (Signed)
Chronic, stable on current regimen - continue.

## 2023-03-04 NOTE — Assessment & Plan Note (Signed)
Chronic, appreciate psychiatry Dr Jennelle Human care.

## 2023-03-04 NOTE — Assessment & Plan Note (Addendum)
Continue daily vit D intake.

## 2023-03-04 NOTE — Assessment & Plan Note (Signed)
Chronic, stable on daily pantoprazole - continue this.

## 2023-03-04 NOTE — Assessment & Plan Note (Signed)
Preventative protocols reviewed and updated unless pt declined. Discussed healthy diet and lifestyle.  

## 2023-03-04 NOTE — Assessment & Plan Note (Signed)
Chronic, iron levels normal - continue oral iron.  UTD colonoscopy (06/2021). UA, iFOB normal last year.  Continue to monitor.

## 2023-03-04 NOTE — Assessment & Plan Note (Signed)
Encourage healthy diet and lifestyle choices to affect sustainable weight loss.

## 2023-03-05 ENCOUNTER — Other Ambulatory Visit: Payer: Self-pay | Admitting: Cardiology

## 2023-03-05 ENCOUNTER — Encounter: Payer: Self-pay | Admitting: Family Medicine

## 2023-03-05 DIAGNOSIS — K5909 Other constipation: Secondary | ICD-10-CM

## 2023-03-06 ENCOUNTER — Encounter: Payer: Self-pay | Admitting: Cardiology

## 2023-03-06 MED ORDER — CARVEDILOL 12.5 MG PO TABS: 12.5000 mg | ORAL_TABLET | Freq: Two times a day (BID) | ORAL | 1 refills | Status: DC

## 2023-03-28 DIAGNOSIS — L218 Other seborrheic dermatitis: Secondary | ICD-10-CM | POA: Diagnosis not present

## 2023-03-28 DIAGNOSIS — L570 Actinic keratosis: Secondary | ICD-10-CM | POA: Diagnosis not present

## 2023-03-28 DIAGNOSIS — L738 Other specified follicular disorders: Secondary | ICD-10-CM | POA: Diagnosis not present

## 2023-03-29 ENCOUNTER — Encounter: Payer: Self-pay | Admitting: Family

## 2023-03-29 ENCOUNTER — Other Ambulatory Visit: Payer: Self-pay | Admitting: Obstetrics & Gynecology

## 2023-03-29 DIAGNOSIS — Z1231 Encounter for screening mammogram for malignant neoplasm of breast: Secondary | ICD-10-CM

## 2023-04-05 DIAGNOSIS — M8580 Other specified disorders of bone density and structure, unspecified site: Secondary | ICD-10-CM | POA: Diagnosis not present

## 2023-04-05 DIAGNOSIS — Z124 Encounter for screening for malignant neoplasm of cervix: Secondary | ICD-10-CM | POA: Diagnosis not present

## 2023-04-17 ENCOUNTER — Other Ambulatory Visit: Payer: Self-pay | Admitting: Family Medicine

## 2023-04-17 DIAGNOSIS — G4733 Obstructive sleep apnea (adult) (pediatric): Secondary | ICD-10-CM | POA: Diagnosis not present

## 2023-04-17 DIAGNOSIS — I10 Essential (primary) hypertension: Secondary | ICD-10-CM | POA: Diagnosis not present

## 2023-04-17 DIAGNOSIS — E118 Type 2 diabetes mellitus with unspecified complications: Secondary | ICD-10-CM

## 2023-04-19 ENCOUNTER — Ambulatory Visit (INDEPENDENT_AMBULATORY_CARE_PROVIDER_SITE_OTHER): Payer: PPO | Admitting: Family Medicine

## 2023-04-19 ENCOUNTER — Encounter: Payer: Self-pay | Admitting: Family Medicine

## 2023-04-19 VITALS — BP 126/74 | HR 72 | Temp 98.6°F | Ht 62.0 in | Wt 186.4 lb

## 2023-04-19 DIAGNOSIS — J069 Acute upper respiratory infection, unspecified: Secondary | ICD-10-CM

## 2023-04-19 MED ORDER — BENZONATATE 200 MG PO CAPS: 200.0000 mg | ORAL_CAPSULE | Freq: Three times a day (TID) | ORAL | 1 refills | Status: DC | PRN

## 2023-04-19 NOTE — Patient Instructions (Signed)
Drink fluids and rest  Delsym if needed for cough  Nasal saline for congestion as needed  Continue flonase  Tylenol for fever or pain or headache  Please alert [place] if symptoms worsen (if severe or short of breath please go to the ER)    Try tessalon for extra cough control if needed   If you get more of a tight chest or any wheezing - we may need to prescribe prednisone  Also watch for fever or any trouble breathing   Update if not starting to improve in a week or if worsening

## 2023-04-19 NOTE — Progress Notes (Signed)
Subjective:    Patient ID: [name] [name], female    DOB: 1951/06/03, 72 y.o.   MRN: [id number]  HPI  Wt Readings from Last 3 Encounters:  04/19/23 186 lb 6 oz (84.5 kg)  03/04/23 179 lb 8 oz (81.4 kg)  01/28/23 185 lb 9.6 oz (84.2 kg)   34.09 kg/m  Vitals:   04/19/23 1215  BP: 126/74  Pulse: 72  Temp: 98.6 F (37 C)  SpO2: 95%    72 yo pf of Dr Reece Agar presents for c/o cough and sensation in throat  She is diabetic Past history of intertrigo and thrush  Takes protonix for acid reflux   Takes arb for HTN  Started 5 days ago  Funny feeling in throat -making her cough / ? Phlegm  Cough is seldom prod- ? If color in mucous  Nasal congestion   No fever  No headache No facial pain  Little light headed  Ears hurt occational- usually left / not often   Throat is not sore No n/v/d No loss of taste or smell   Whole family has symptoms  None with covid Walking pneumonia    Over the counter  Flonase  Occational tylenol for arthritis      Patient Active Problem List   Diagnosis Date Noted   Decreased hearing of both ears 03/04/2023   Thrombocytopenia (HCC) 03/04/2023   Lung nodule 01/22/2023   Systolic murmur 04/20/2022   Bruit of right carotid artery 04/14/2022   Chronic fatigue 04/14/2022   Anemia 04/10/2022   Medicare annual wellness visit, subsequent 02/28/2021   Advanced directives, counseling/discussion 02/28/2021   Left leg swelling 02/28/2021   Ex-smoker 11/09/2020   Tongue coating 10/21/2020   Primary osteoarthritis of both first carpometacarpal joints 05/01/2018   Chronic constipation 01/17/2017   NAFLD (nonalcoholic fatty liver disease) 41/32/4401   Vitamin D deficiency 09/20/2016   Obesity, Class I, BMI 30-34.9 09/05/2016   Left carpal tunnel syndrome 08/18/2015   Viral URI with cough 04/02/2013   Intertrigo 05/28/2012   Microalbuminuria 11/29/2011   Osteopenia 07/13/2011   Internal and external hemorrhoids without complication 05/30/2011    Benign neoplasm of colon 05/30/2011   Health maintenance examination 05/09/2011   Essential hypertension, benign 01/12/2009   Type 2 diabetes mellitus with other specified complication (HCC) 01/11/2007   Hyperlipidemia associated with type 2 diabetes mellitus (HCC) 01/11/2007   MDD (major depressive disorder), recurrent episode, moderate (HCC) 01/11/2007   OBSTRUCTIVE SLEEP APNEA 01/11/2007   Coronary atherosclerosis 01/11/2007   Seasonal and perennial allergic rhinitis 01/11/2007   GERD 01/11/2007   Past Medical History:  Diagnosis Date   Acquired deformity of right foot    Back pain    CAD (coronary artery disease) primary cardioloigst-  dr Jens Som   hx NSTEMI 10-24-2001  s/p  cardiac cath w/ PCI and DES x1 to first diagonal   Chronic constipation    Constipation    COVID-19 06/02/2020   Depression    Fatty liver 04/26/2015   GERD (gastroesophageal reflux disease)    History of abnormal cervical Pap smear    ACUS 2012   History of exercise stress test 05-25-2014  dr Jens Som   borderline (indeterminate) with non-specific ST changes (mild ST depression in leads II,III, aVF, & V6, did not meet criteria for ischemia) ;  pt had low intolerence, no cp, normal heartrate and bp response, no arrhythmias   History of goiter    as child s/p  removal ,  per pt  benign , neck area  (not thyroid)   History of heart attack    History of non-ST elevation myocardial infarction (NSTEMI) 10/24/2001   s/p  PCI and DES to first diagonal   Hyperlipidemia    Hypertension    Joint pain    OSA on CPAP pulmoloigst-  dr young   per study 11-07-2004  severe osa   Right foot pain    S/P drug eluting coronary stent placement 10/27/2001   x1 to first diagonal    Seasonal and perennial allergic rhinitis    Sleep apnea    SOB (shortness of breath)    Type 2 diabetes mellitus (HCC)    Vitamin D deficiency    Wears dentures    upper and lower partial denture   Wears glasses    Past Surgical  History:  Procedure Laterality Date   BUNIONECTOMY Left 2012   BUNIONECTOMY Right 04/19/2017   Procedure: RIGHT BUNIONECTOMY;  Surgeon: Larey Dresser, DPM;  Location: [facility];  Service: Podiatry;  Laterality: Right;   CARDIAC CATHETERIZATION  09-11-2002   dr Riley Kill   well-preserved LVF; continued patency previouly placed stent in diagonal branch;  mild luminal irregularities    CARDIOVASCULAR STRESS TEST  05-16-2011   dr Jens Som   normal nuclear study w/ no evidence ischemia/  normal LV function and wall motion , ef 77%   COLONOSCOPY  06/2021   TAs, diverticulosis, int hem, rpt 3 yrs ([facility])   CORONARY ANGIOPLASTY WITH STENT PLACEMENT  10-27-2001   dr Riley Kill   high-grade stenosis first diagonal post PCI and DES (TAXUS);  mild luminal irregularity throughout LAD, RCA, and very mild CFx;  preserved LVF   goiter removal  1968   per pt benign tumor removed from neck beside throat   HALLUX VALGUS LAPIDUS Right 04/19/2017   Procedure: HALLUX VALGUS LAPIDUS FUSION;  Surgeon: Larey Dresser, DPM;  Location: [facility];  Service: Podiatry;  Laterality: Right;   HAMMER TOE SURGERY Right 04/19/2017   Procedure: HAMMER TOE CORRECTION RIGHT 2ND;  Surgeon: Larey Dresser, DPM;  Location: [facility];  Service: Podiatry;  Laterality: Right;   METATARSAL OSTEOTOMY Right 04/19/2017   Procedure: 2ND METATARSAL OSTEOTOMY;  Surgeon: Larey Dresser, DPM;  Location: [facility];  Service: Podiatry;  Laterality: Right;   TONSILLECTOMY AND ADENOIDECTOMY  age 32   TUBAL LIGATION Bilateral 1985   Social History   Tobacco Use   Smoking status: Former    Current packs/day: 0.00    Average packs/day: 1.5 packs/day for 48.0 years (72.0 ttl pk-yrs)    Types: Cigarettes    Start date: 09/23/1964    Quit date: 09/23/2012    Years since quitting: 10.5   Smokeless tobacco: Never  Vaping Use   Vaping status: Never Used  Substance Use Topics    Alcohol use: Yes    Alcohol/week: 0.0 standard drinks of alcohol    Comment: occasional wine   Drug use: No   Family History  Problem Relation Age of Onset   Esophageal cancer Mother    Cancer Mother        throat, cervical   Diabetes Mother    Stroke Mother    Heart attack Mother    Heart disease Mother    Hypertension Mother    Thyroid disease Mother    Alcoholism Mother    Liver disease Mother    Cancer Father        prostate   Diabetes  Sister    Heart attack Sister    Hypertension Sister    Hyperlipidemia Sister    Colon cancer Neg Hx    Breast cancer Neg Hx    Colon polyps Neg Hx    Stomach cancer Neg Hx    Rectal cancer Neg Hx    No Known Allergies Current Outpatient Medications on File Prior to Visit  Medication Sig Dispense Refill   albuterol (VENTOLIN HFA) 108 (90 Base) MCG/ACT inhaler Inhale 1-2 puffs into the lungs every 6 (six) hours as needed for wheezing or shortness of breath. 1 each 0   amLODipine (NORVASC) 5 MG tablet Take 1 tablet (5 mg total) by mouth daily. 90 tablet 3   aspirin 81 MG tablet Take 81 mg by mouth daily.     blood glucose meter kit and supplies KIT Dispense based on patient and insurance preference. Use up to four times daily as directed. (FOR ICD-9 250.00, 250.01). 1 each 0   buPROPion (WELLBUTRIN XL) 150 MG 24 hr tablet Take 3 tablets (450 mg total) by mouth daily. 270 tablet 3   Calcium Carbonate-Vit D-Min (RA CALCIUM 600/VIT D/MINERALS) 600-400 MG-UNIT CHEW Chew 1 tablet by mouth daily.     carvedilol (COREG) 12.5 MG tablet Take 1 tablet (12.5 mg total) by mouth 2 (two) times daily with a meal. TAKE 1 AND 1/2 TABLETS(18.75 MG) BY MOUTH TWICE DAILY 270 tablet 1   Cholecalciferol (VITAMIN D3) 3000 units TABS Take 1 tablet by mouth daily. 30 tablet    Dulaglutide (TRULICITY) 0.75 MG/0.5ML SOPN Inject 0.75 mg into the skin once a week. 2 mL 11   fluticasone (FLONASE) 50 MCG/ACT nasal spray Place 2 sprays into both nostrils daily. 16 g 12    glucose blood (ONETOUCH ULTRA) test strip TEST BLOOD SUGAR ONCE DAILY 100 strip 3   hydrALAZINE (APRESOLINE) 100 MG tablet Take 1 tablet (100 mg total) by mouth 3 (three) times daily. 270 tablet 3   hydrocortisone 2.5 % cream Apply topically 2 (two) times daily.     Iron, Ferrous Sulfate, 325 (65 Fe) MG TABS Take 325 mg by mouth every Monday, Wednesday, and Friday.     ketoconazole (NIZORAL) 2 % cream Apply topically daily.     ketoconazole (NIZORAL) 2 % shampoo Apply topically 3 (three) times a week.     Lancets (ONETOUCH DELICA PLUS LANCET33G) MISC USE AS INSTRUCTED TO CHECK BLOOD SUGAR ONCE A DAY 100 each 4   linaclotide (LINZESS) 145 MCG CAPS capsule Take 1 capsule (145 mcg total) by mouth daily before breakfast. 30 capsule 6   metFORMIN (GLUCOPHAGE-XR) 500 MG 24 hr tablet Take 1 tablet (500 mg total) by mouth every other day. 45 tablet 4   Multiple Vitamins-Minerals (MULTIVITAMIN ADULTS 50+ PO) Take 1 tablet every morning by mouth.      olmesartan-hydrochlorothiazide (BENICAR HCT) 40-12.5 MG tablet TAKE 1 TABLET BY MOUTH DAILY 90 tablet 1   pantoprazole (PROTONIX) 40 MG tablet Take 1 tablet (40 mg total) by mouth daily. 90 tablet 4   polyethylene glycol (MIRALAX / GLYCOLAX) 17 g packet Take 17 g by mouth daily.     Probiotic Product (PROBIOTIC DAILY PO) Take 1 capsule by mouth every morning.     rosuvastatin (CRESTOR) 20 MG tablet TAKE 1 TABLET(20 MG) BY MOUTH DAILY 90 tablet 3   No current facility-administered medications on file prior to visit.    Review of Systems  Constitutional:  Positive for appetite change and fatigue. Negative for fever.  HENT:  Positive for congestion, postnasal drip, rhinorrhea, sinus pressure, sneezing and sore throat. Negative for ear pain.   Eyes:  Negative for pain and discharge.  Respiratory:  Positive for cough. Negative for shortness of breath, wheezing and stridor.   Cardiovascular:  Negative for chest pain.  Gastrointestinal:  Negative for diarrhea,  nausea and vomiting.  Genitourinary:  Negative for frequency, hematuria and urgency.  Musculoskeletal:  Negative for arthralgias and myalgias.  Skin:  Negative for rash.  Neurological:  Positive for headaches. Negative for dizziness, weakness and light-headedness.  Psychiatric/Behavioral:  Negative for confusion and dysphoric mood.        Objective:   Physical Exam Constitutional:      General: She is not in acute distress.    Appearance: Normal appearance. She is well-developed. She is obese. She is not ill-appearing, toxic-appearing or diaphoretic.  HENT:     Head: Normocephalic and atraumatic.     Comments: Nares are injected and congested      Right Ear: Tympanic membrane, ear canal and external ear normal.     Left Ear: Tympanic membrane, ear canal and external ear normal.     Nose: Congestion and rhinorrhea present.     Mouth/Throat:     Mouth: Mucous membranes are moist.     Pharynx: Oropharynx is clear. No oropharyngeal exudate or posterior oropharyngeal erythema.     Comments: Clear pnd  Eyes:     General:        Right eye: No discharge.        Left eye: No discharge.     Conjunctiva/sclera: Conjunctivae normal.     Pupils: Pupils are equal, round, and reactive to light.  Cardiovascular:     Rate and Rhythm: Normal rate.     Heart sounds: Normal heart sounds.  Pulmonary:     Effort: Pulmonary effort is normal. No respiratory distress.     Breath sounds: Normal breath sounds. No stridor. No wheezing, rhonchi or rales.     Comments: Harsh bs No rales or rhonchi Chest:     Chest wall: No tenderness.  Musculoskeletal:     Cervical back: Normal range of motion and neck supple.  Lymphadenopathy:     Cervical: No cervical adenopathy.  Skin:    General: Skin is warm and dry.     Capillary Refill: Capillary refill takes less than 2 seconds.     Findings: No rash.  Neurological:     Mental Status: She is alert.     Cranial Nerves: No cranial nerve deficit.   Psychiatric:        Mood and Affect: Mood normal.           Assessment & Plan:   Problem List Items Addressed This Visit       Respiratory   Viral URI with cough - Primary    Day 5 / with exp to family with uris  Reassuring exam  See AVS for symptom care Sent tessalon pearles   If worse chest tightness consider prednisone (would like to avoid with dm) Update if not starting to improve in a week or if worsening  Call back and Er precautions noted in detail today

## 2023-04-19 NOTE — Assessment & Plan Note (Signed)
Day 5 / with exp to family with uris  Reassuring exam  See AVS for symptom care Sent tessalon pearles   If worse chest tightness consider prednisone (would like to avoid with dm) Update if not starting to improve in a week or if worsening  Call back and Er precautions noted in detail today

## 2023-04-24 ENCOUNTER — Ambulatory Visit: Payer: PPO

## 2023-04-24 ENCOUNTER — Ambulatory Visit
Admission: RE | Admit: 2023-04-24 | Discharge: 2023-04-24 | Disposition: A | Discharge status: Home / Self Care | Payer: PPO | Source: Ambulatory Visit | Attending: Acute Care | Admitting: Acute Care

## 2023-04-24 DIAGNOSIS — R911 Solitary pulmonary nodule: Secondary | ICD-10-CM | POA: Insufficient documentation

## 2023-04-24 DIAGNOSIS — Z87891 Personal history of nicotine dependence: Secondary | ICD-10-CM | POA: Insufficient documentation

## 2023-04-28 ENCOUNTER — Telehealth: Payer: PPO | Admitting: Family Medicine

## 2023-04-28 DIAGNOSIS — J40 Bronchitis, not specified as acute or chronic: Secondary | ICD-10-CM | POA: Diagnosis not present

## 2023-04-28 MED ORDER — DOXYCYCLINE HYCLATE 100 MG PO TABS
100.0000 mg | ORAL_TABLET | Freq: Two times a day (BID) | ORAL | 0 refills | Status: AC
Start: 2023-04-28 — End: 2023-05-08

## 2023-04-28 NOTE — Progress Notes (Signed)
E-Visit for Cough   We are sorry that you are not feeling well.  Here is how we plan to help!  Based on your presentation I believe you most likely have A cough due to bacteria.  When patients have a fever and a productive cough with a change in color or increased sputum production, we are concerned about bacterial bronchitis.  If left untreated it can progress to pneumonia.  If your symptoms do not improve with your treatment plan it is important that you contact your provider.   I have prescribed Doxycycline 100 mg twice a day for 7 days        From your responses in the eVisit questionnaire you describe inflammation in the upper respiratory tract which is causing a significant cough.  This is commonly called Bronchitis and has four common causes:   Allergies Viral Infections Acid Reflux Bacterial Infection Allergies, viruses and acid reflux are treated by controlling symptoms or eliminating the cause. An example might be a cough caused by taking certain blood pressure medications. You stop the cough by changing the medication. Another example might be a cough caused by acid reflux. Controlling the reflux helps control the cough.  USE OF BRONCHODILATOR ("RESCUE") INHALERS: There is a risk from using your bronchodilator too frequently.  The risk is that over-reliance on a medication which only relaxes the muscles surrounding the breathing tubes can reduce the effectiveness of medications prescribed to reduce swelling and congestion of the tubes themselves.  Although you feel brief relief from the bronchodilator inhaler, your asthma may actually be worsening with the tubes becoming more swollen and filled with mucus.  This can delay other crucial treatments, such as oral steroid medications. If you need to use a bronchodilator inhaler daily, several times per day, you should discuss this with your provider.  There are probably better treatments that could be used to keep your asthma under control.      HOME CARE Only take medications as instructed by your medical team. Complete the entire course of an antibiotic. Drink plenty of fluids and get plenty of rest. Avoid close contacts especially the very young and the elderly Cover your mouth if you cough or cough into your sleeve. Always remember to wash your hands A steam or ultrasonic humidifier can help congestion.   GET HELP RIGHT AWAY IF: You develop worsening fever. You become short of breath You cough up blood. Your symptoms persist after you have completed your treatment plan MAKE SURE YOU  Understand these instructions. Will watch your condition. Will get help right away if you are not doing well or get worse.    Thank you for choosing an e-visit.  Your e-visit answers were reviewed by a board certified advanced clinical practitioner to complete your personal care plan. Depending upon the condition, your plan could have included both over the counter or prescription medications.  Please review your pharmacy choice. Make sure the pharmacy is open so you can pick up prescription now. If there is a problem, you may contact your provider through [employer] and have the prescription routed to another pharmacy.  Your safety is important to [place]. If you have drug allergies check your prescription carefully.   For the next 24 hours you can use MyChart to ask questions about today's visit, request a non-urgent call back, or ask for a work or school excuse. You will get an email in the next two days asking about your experience. I hope that your e-visit  has been valuable and will speed your recovery.   have provided 5 minutes of non face to face time during this encounter for chart review and documentation.

## 2023-04-29 ENCOUNTER — Encounter: Payer: Self-pay | Admitting: Internal Medicine

## 2023-04-30 ENCOUNTER — Ambulatory Visit
Admission: RE | Admit: 2023-04-30 | Discharge: 2023-04-30 | Disposition: A | Discharge status: Home / Self Care | Payer: PPO | Source: Ambulatory Visit | Attending: Obstetrics & Gynecology | Admitting: Obstetrics & Gynecology

## 2023-04-30 DIAGNOSIS — Z1231 Encounter for screening mammogram for malignant neoplasm of breast: Secondary | ICD-10-CM | POA: Diagnosis not present

## 2023-05-02 ENCOUNTER — Ambulatory Visit: Payer: PPO | Admitting: Internal Medicine

## 2023-05-02 NOTE — Progress Notes (Unsigned)
HPI-  female former smoker followed for OSA, complicated by allergic rhinitis, CAD/MI/stent, obesity, depression, DM 2, HBP, GERD, Allergic Rhinitis,  NPSG 11/07/04 Severe OSA, AHI 45/ hr, CPAP 10.   ==============================================   01/22/23- 73 year old female former smoker (50 pkyrs) followed for OSA, RLL NOdule, complicated by allergic rhinitis, CAD/MI/stent, obesity, depression, DM 2, HBP, GERD,  CPAP 5-15/ [employer]     AirSense 11 AutoSet Download compliance-97%, AHI 2.9/ hr Body weight today-182 lbs -----Wearing CPAP-leaking around mask at times Download reviewed. Will ask mask refit, but otherwise does well with CPAP. - Lung nodule discussed. Understands recommendation for f/u CT. We will also get PFT. Breathing is comfortable. CT chest Low dose screening 01/15/23-   NOTE IMPRESSION: 1. New irregular subpleural nodular opacity of the right lower lobe measuring 6.3 mm, short-term follow-up is recommended. Lung-RADS 4A, suspicious. Follow up low-dose chest CT without contrast in 3 months (please use the following order, CT CHEST LCS NODULE FOLLOW-UP W/OCM) is recommended. Alternatively, PET may be considered when there is a solid component 8mm or larger. 2. Coronary artery calcifications, aortic Atherosclerosis (ICD10-I70.0) and Emphysema (ICD10-J43.9).  05/03/23- 72 year old female former smoker (50 pkyrs) followed for OSA, RLL Nodule, complicated by allergic rhinitis, CAD/MI/stent, obesity, depression, DM 2, HBP, GERD,  - Albuterol hfa,  CPAP 5-15/ [employer]     AirSense 11 AutoSet Download compliance-97%, AHI 1.7/hr Body weight today-187 lbs   PFT pending-  CT chest LCS Nodule f/u 04/24/23- report pending  ROS-see HPI   + = positive Constitutional:    weight loss, night sweats, fevers, chills, +fatigue, lassitude. HEENT:    headaches, difficulty swallowing, tooth/dental problems, sore throat,       sneezing, itching, ear ache, nasal congestion, post nasal drip,  snoring CV:    chest pain, orthopnea, PND, swelling in lower extremities, anasarca,                                   dizziness, palpitations Resp:   shortness of breath with exertion or at rest.                productive cough,   non-productive cough, coughing up of blood.              change in color of mucus.  wheezing.   Skin:    rash or lesions. GI: + heartburn, indigestion, +abdominal pain, nausea, vomiting, diarrhea,                 change in bowel habits, loss of appetite GU: dysuria, change in color of urine, no urgency or frequency.   flank pain. MS:   +joint pain, stiffness, decreased range of motion, back pain. Neuro-     nothing unusual Psych:  change in mood or affect.  +depression or anxiety.   memory loss.  OBJ- Physical Exam General- Alert, Oriented, Affect-appropriate, Distress- none acute, + obese Skin- rash-none, lesions- none, excoriation- none Lymphadenopathy- none Head- atraumatic            Eyes- Gross vision intact, PERRLA, conjunctivae and secretions clear            Ears- Hearing, canals-normal            Nose- Clear, no-Septal dev, mucus, polyps, erosion, perforation             Throat- Mallampati IV , mucosa clear , drainage- none, tonsils- atrophic Neck- flexible , trachea midline, no  stridor , thyroid nl, carotid no bruit Chest - symmetrical excursion , unlabored           Heart/CV- RRR , no murmur , no gallop  , no rub, nl s1 s2                           - JVD- none , edema- none, stasis changes- none, varices- none           Lung- +coarse in bases, wheeze- none, cough- none , dullness-none, rub- none           Chest wall-  Abd-  Br/ Gen/ Rectal- Not done, not indicated Extrem- +superficial varices-legs Neuro- grossly intact to observation

## 2023-05-03 ENCOUNTER — Ambulatory Visit: Payer: PPO | Admitting: Internal Medicine

## 2023-05-03 ENCOUNTER — Ambulatory Visit (INDEPENDENT_AMBULATORY_CARE_PROVIDER_SITE_OTHER): Payer: PPO | Admitting: Internal Medicine

## 2023-05-03 ENCOUNTER — Encounter: Payer: Self-pay | Admitting: Internal Medicine

## 2023-05-03 VITALS — BP 130/76 | HR 75 | Ht 62.0 in | Wt 187.0 lb

## 2023-05-03 DIAGNOSIS — R911 Solitary pulmonary nodule: Secondary | ICD-10-CM | POA: Diagnosis not present

## 2023-05-03 DIAGNOSIS — G4733 Obstructive sleep apnea (adult) (pediatric): Secondary | ICD-10-CM | POA: Diagnosis not present

## 2023-05-03 DIAGNOSIS — J40 Bronchitis, not specified as acute or chronic: Secondary | ICD-10-CM | POA: Diagnosis not present

## 2023-05-03 LAB — PULMONARY FUNCTION TEST
DL/VA % pred: 80 %
DL/VA: 3.35 ml/min/mmHg/L
DLCO cor % pred: 75 %
DLCO cor: 13.56 ml/min/mmHg
DLCO unc % pred: 75 %
DLCO unc: 13.56 ml/min/mmHg
FEF 25-75 Post: 1.47 L/s
FEF 25-75 Pre: 1.08 L/s
FEF2575-%Change-Post: 35 %
FEF2575-%Pred-Post: 89 %
FEF2575-%Pred-Pre: 66 %
FEV1-%Change-Post: 7 %
FEV1-%Pred-Post: 87 %
FEV1-%Pred-Pre: 81 %
FEV1-Post: 1.73 L
FEV1-Pre: 1.61 L
FEV1FVC-%Change-Post: -1 %
FEV1FVC-%Pred-Pre: 95 %
FEV6-%Change-Post: 5 %
FEV6-%Pred-Post: 94 %
FEV6-%Pred-Pre: 89 %
FEV6-Post: 2.37 L
FEV6-Pre: 2.24 L
FEV6FVC-%Change-Post: -3 %
FEV6FVC-%Pred-Post: 101 %
FEV6FVC-%Pred-Pre: 105 %
FVC-%Change-Post: 9 %
FVC-%Pred-Post: 92 %
FVC-%Pred-Pre: 84 %
FVC-Post: 2.45 L
FVC-Pre: 2.24 L
Post FEV1/FVC ratio: 71 %
Post FEV6/FVC ratio: 97 %
Pre FEV1/FVC ratio: 72 %
Pre FEV6/FVC Ratio: 100 %
RV % pred: 157 %
RV: 3.39 L
TLC % pred: 118 %
TLC: 5.65 L

## 2023-05-03 NOTE — Progress Notes (Signed)
Full PFT performed today. °

## 2023-05-03 NOTE — Patient Instructions (Signed)
Full PFT performed today. °

## 2023-05-03 NOTE — Patient Instructions (Signed)
We will call you result of the CT scan- hopefully today.  We can continue CPAP auto 5-15  Finish the doxycycline. Let us know if you have trouble getting over this bronchitis.

## 2023-05-08 ENCOUNTER — Encounter: Payer: Self-pay | Admitting: *Deleted

## 2023-05-08 ENCOUNTER — Telehealth: Payer: Self-pay | Admitting: *Deleted

## 2023-05-08 DIAGNOSIS — R911 Solitary pulmonary nodule: Secondary | ICD-10-CM

## 2023-05-08 DIAGNOSIS — Z87891 Personal history of nicotine dependence: Secondary | ICD-10-CM

## 2023-05-08 NOTE — Addendum Note (Signed)
Addended by: Abigail Miyamoto D on: 05/08/2023 10:38 AM   Modules accepted: Orders

## 2023-05-08 NOTE — Telephone Encounter (Signed)
Attempted to contact patient but unable to leave a voicemail. Mychart letter sent to pt requesting her to call to discuss CT results.

## 2023-05-08 NOTE — Telephone Encounter (Signed)
Spoke with pt and reviewed lung screening CT results. Previous nodule seen remain stable in size. New small nodule seen that could be related to infection/ inflammation. PT recently finished course of Doxycycline. PT advised we will repeat CT in 6 months. Results/ plans faxed to PCP. Order placed for 6 month nodule follow up CT.

## 2023-05-19 ENCOUNTER — Other Ambulatory Visit: Payer: Self-pay | Admitting: Cardiology

## 2023-05-19 DIAGNOSIS — E7800 Pure hypercholesterolemia, unspecified: Secondary | ICD-10-CM

## 2023-05-20 ENCOUNTER — Encounter: Payer: Self-pay | Admitting: Psychiatry

## 2023-05-20 ENCOUNTER — Ambulatory Visit: Payer: PPO | Admitting: Psychiatry

## 2023-05-20 DIAGNOSIS — F3342 Major depressive disorder, recurrent, in full remission: Secondary | ICD-10-CM

## 2023-05-20 MED ORDER — BUPROPION HCL ER (XL) 150 MG PO TB24: 450.0000 mg | ORAL_TABLET | Freq: Every day | ORAL | 3 refills | Status: DC

## 2023-05-20 NOTE — Progress Notes (Signed)
[name] [name] [id number] July 15, 1950 72 y.o.  Subjective:   Patient ID:  [name] [name] is a 72 y.o. (DOB 03/30/1951) female.  Chief Complaint:  Chief Complaint  Patient presents with   Follow-up   Depression    Depression        Associated symptoms include decreased concentration.  Associated symptoms include no suicidal ideas.  [name] [name] presents to the office today for follow-up of depression and med change.   When seen December. Recommended NAC  And stop naltrexone to see if it was causing GI problems.  Rec counseling for hoarding but she didn't go.  seen June 2020 & 05/2019.  No meds were changed.  Only taking Wellbutrin XL 450 mg daily from here.  Following noted: Some therapy with  [name] [name] about hoarding but it never got followed up on it.  He doesn't respond to phone calls or emails.  Did accomplish some with hoarding bc no anxiety with it but he helped her get some of it done. Retirement date Mar 15, 2018 and used to it now.  All these years she's neglected her house but doesn't do anything about it.  Some hoarding.  Doesn't like to stay at home.  No where to put anything in the house bc the closets are full.  Can't get motivated to do anything about it.  Thinks it's motivation and uncertainty over what to do with things.  Won't let people visit bc she's embarrassed.  Doesn't like the house.  H won't move and won't help get rid of things.  05/17/20 appt with following noted: Still only on Wellbutrin 450 mg daily. No SE. Retired and [employer] keeping her busy, sitting 2-3 times per week.   Hard adjustment with retirement initially resolved.  Not depressed. Plan: [name] [name]'s depression is controlled and she is satisfied with the medications, but wonders about decreasing it.  OK to reduce Welllbutrin to 300 mg daily.  05/17/2021 appointment with the following noted: Didn't work to reduce Wellbutrin to 300 mg daily with more depression and irritability. D noticed too.  It's  amazing how it works with more Wellbutrin to 450 mg daily. Picks on H too much if not taking Wellbutrin.   Plan: Failed attempt to reduce Welllbutrin to 300 mg daily, therefore continue 450 mg AM.  05/17/22 appt noted: Continues Wellbutrin 450 am as only psych med. Gets a fog in her head.  During the day but wears off as the day progresses. But is not every day  Sx present for months. Asks if it could be SE.   Normal labs and thyroid and carotid arteries.   Pleased with Wellbutrin.  Mind would go to the wrong places if on less. Sleep good with retirement. 8 grandkids from 3 kids.    05/20/23 appt noted: What is difference between situational vs dep.  Married 46 years and both retired and now dealing with his selfishness all the time bc they are around all the time.  Tries to keep busy and sometimes stay out of the house.  No abuse.   Continue Wellbutrin XL 450 mg AM. No SE.    Patient reports stable mood and denies depressed or irritable moods.  Patient denies any recent difficulty with anxiety.  Patient denies difficulty with sleep initiation or maintenance but gets 8 hours or more.  Denies appetite disturbance.  Patient reports that energy has been good.  Motivation to clean is not good. Patient denies any difficulty with concentration.  Patient denies  any suicidal ideation.   Past Psychiatric Medication Trials: Wellbutrin XL 450 + Vraylar 1.5, Abilify 10,  Xanax She has been on Wellbutrin from this practice since 2004 Naltrexone [facility]  Review of Systems:  Review of Systems  Cardiovascular:  Negative for palpitations.  Musculoskeletal:  Positive for arthralgias and back pain.  Neurological:  Negative for tremors and weakness.  Psychiatric/Behavioral:  Positive for decreased concentration. Negative for agitation, behavioral problems, confusion, dysphoric mood, hallucinations, self-injury, sleep disturbance and suicidal ideas. The patient is not nervous/anxious and is not hyperactive.      Medications: I have reviewed the patient's current medications.  Current Outpatient Medications  Medication Sig Dispense Refill   albuterol (VENTOLIN HFA) 108 (90 Base) MCG/ACT inhaler Inhale 1-2 puffs into the lungs every 6 (six) hours as needed for wheezing or shortness of breath. 1 each 0   amLODipine (NORVASC) 5 MG tablet Take 1 tablet (5 mg total) by mouth daily. 90 tablet 3   aspirin 81 MG tablet Take 81 mg by mouth daily.     benzonatate (TESSALON) 200 MG capsule Take 1 capsule (200 mg total) by mouth 3 (three) times daily as needed for cough. Swallow whole 30 capsule 1   blood glucose meter kit and supplies KIT Dispense based on patient and insurance preference. Use up to four times daily as directed. (FOR ICD-9 250.00, 250.01). 1 each 0   Calcium Carbonate-Vit D-Min (RA CALCIUM 600/VIT D/MINERALS) 600-400 MG-UNIT CHEW Chew 1 tablet by mouth daily.     carvedilol (COREG) 12.5 MG tablet Take 1 tablet (12.5 mg total) by mouth 2 (two) times daily with a meal. TAKE 1 AND 1/2 TABLETS(18.75 MG) BY MOUTH TWICE DAILY 270 tablet 1   Cholecalciferol (VITAMIN D3) 3000 units TABS Take 1 tablet by mouth daily. 30 tablet    Dulaglutide (TRULICITY) 0.75 MG/0.5ML SOPN Inject 0.75 mg into the skin once a week. 2 mL 11   fluticasone (FLONASE) 50 MCG/ACT nasal spray Place 2 sprays into both nostrils daily. 16 g 12   glucose blood (ONETOUCH ULTRA) test strip TEST BLOOD SUGAR ONCE DAILY 100 strip 3   hydrALAZINE (APRESOLINE) 100 MG tablet Take 1 tablet (100 mg total) by mouth 3 (three) times daily. 270 tablet 3   hydrocortisone 2.5 % cream Apply topically 2 (two) times daily.     Iron, Ferrous Sulfate, 325 (65 Fe) MG TABS Take 325 mg by mouth every Monday, Wednesday, and Friday.     ketoconazole (NIZORAL) 2 % cream Apply topically daily.     ketoconazole (NIZORAL) 2 % shampoo Apply topically 3 (three) times a week.     Lancets (ONETOUCH DELICA PLUS LANCET33G) MISC USE AS INSTRUCTED TO CHECK BLOOD SUGAR  ONCE A DAY 100 each 4   linaclotide (LINZESS) 145 MCG CAPS capsule Take 1 capsule (145 mcg total) by mouth daily before breakfast. 30 capsule 6   metFORMIN (GLUCOPHAGE-XR) 500 MG 24 hr tablet Take 1 tablet (500 mg total) by mouth every other day. 45 tablet 4   Multiple Vitamins-Minerals (MULTIVITAMIN ADULTS 50+ PO) Take 1 tablet every morning by mouth.      olmesartan-hydrochlorothiazide (BENICAR HCT) 40-12.5 MG tablet TAKE 1 TABLET BY MOUTH DAILY 90 tablet 1   pantoprazole (PROTONIX) 40 MG tablet Take 1 tablet (40 mg total) by mouth daily. 90 tablet 4   polyethylene glycol (MIRALAX / GLYCOLAX) 17 g packet Take 17 g by mouth daily.     Probiotic Product (PROBIOTIC DAILY PO) Take 1 capsule by  mouth every morning.     rosuvastatin (CRESTOR) 20 MG tablet TAKE 1 TABLET(20 MG) BY MOUTH DAILY 90 tablet 3   buPROPion (WELLBUTRIN XL) 150 MG 24 hr tablet Take 3 tablets (450 mg total) by mouth daily. 270 tablet 3   No current facility-administered medications for this visit.    Medication Side Effects: None  Allergies: No Known Allergies  Past Medical History:  Diagnosis Date   Acquired deformity of right foot    Back pain    CAD (coronary artery disease) primary cardioloigst-  dr Jens Som   hx NSTEMI 10-24-2001  s/p  cardiac cath w/ PCI and DES x1 to first diagonal   Chronic constipation    Constipation    COVID-19 06/02/2020   Depression    Fatty liver 04/26/2015   GERD (gastroesophageal reflux disease)    History of abnormal cervical Pap smear    ACUS 2012   History of exercise stress test 05-25-2014  dr Jens Som   borderline (indeterminate) with non-specific ST changes (mild ST depression in leads II,III, aVF, & V6, did not meet criteria for ischemia) ;  pt had low intolerence, no cp, normal heartrate and bp response, no arrhythmias   History of goiter    as child s/p  removal ,  per pt benign , neck area  (not thyroid)   History of heart attack    History of non-ST elevation  myocardial infarction (NSTEMI) 10/24/2001   s/p  PCI and DES to first diagonal   Hyperlipidemia    Hypertension    Joint pain    OSA on CPAP pulmoloigst-  dr young   per study 11-07-2004  severe osa   Right foot pain    S/P drug eluting coronary stent placement 10/27/2001   x1 to first diagonal    Seasonal and perennial allergic rhinitis    Sleep apnea    SOB (shortness of breath)    Type 2 diabetes mellitus (HCC)    Vitamin D deficiency    Wears dentures    upper and lower partial denture   Wears glasses     Family History  Problem Relation Age of Onset   Esophageal cancer Mother    Cancer Mother        throat, cervical   Diabetes Mother    Stroke Mother    Heart attack Mother    Heart disease Mother    Hypertension Mother    Thyroid disease Mother    Alcoholism Mother    Liver disease Mother    Cancer Father        prostate   Diabetes Sister    Heart attack Sister    Hypertension Sister    Hyperlipidemia Sister    Colon cancer Neg Hx    Breast cancer Neg Hx    Colon polyps Neg Hx    Stomach cancer Neg Hx    Rectal cancer Neg Hx     Social History   Socioeconomic History   Marital status: Married    Spouse name: Fayrene Fearing   Number of children: 3   Years of education: Not on file   Highest education level: 12th grade  Occupational History   Occupation: Data processing manager    Comment: credit Copywriter, advertising: [employer]   Occupation: Retired  Tobacco Use   Smoking status: Former    Current packs/day: 0.00    Average packs/day: 1.5 packs/day for 48.0 years (72.0 ttl pk-yrs)  Types: Cigarettes    Start date: 09/23/1964    Quit date: 09/23/2012    Years since quitting: 10.6   Smokeless tobacco: Never  Vaping Use   Vaping status: Never Used  Substance and Sexual Activity   Alcohol use: Yes    Alcohol/week: 0.0 standard drinks of alcohol    Comment: occasional wine   Drug use: No   Sexual activity: Not Currently  Other Topics  Concern   Not on file  Social History Narrative   Caffeine Use:  2 cups coffee daily   Regular exercise:  No   Lives with husband    Occ: retired, 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    Activity: no regular activity    Diet: good water, fruits/vegetables daily    Social Determinants of Health   Financial Resource Strain: Low Risk  (02/28/2023)   Overall Financial Resource Strain (CARDIA)    Difficulty of Paying Living Expenses: Not hard at all  Food Insecurity: No Food Insecurity (02/28/2023)   Hunger Vital Sign    Worried About Running Out of Food in the Last Year: Never true    Ran Out of Food in the Last Year: Never true  Transportation Needs: No Transportation Needs (02/28/2023)   PRAPARE - Administrator, Civil Service (Medical): No    Lack of Transportation (Non-Medical): No  Physical Activity: Insufficiently Active (02/28/2023)   Exercise Vital Sign    Days of Exercise per Week: 2 days    Minutes of Exercise per Session: 20 min  Stress: No Stress Concern Present (02/28/2023)   [employer] of Occupational Health - Occupational Stress Questionnaire    Feeling of Stress : Only a little  Social Connections: Socially Integrated (02/28/2023)   Social Connection and Isolation Panel [NHANES]    Frequency of Communication with Friends and Family: More than three times a week    Frequency of Social Gatherings with Friends and Family: More than three times a week    Attends Religious Services: More than 4 times per year    Active Member of [employer] or Organizations: Yes    Attends Banker Meetings: More than 4 times per year    Marital Status: Married  Catering manager Violence: Not At Risk (03/01/2022)   Humiliation, Afraid, Rape, and Kick questionnaire    Fear of Current or Ex-Partner: No    Emotionally Abused: No    Physically Abused: No    Sexually Abused: No    Past Medical History, Surgical history, Social history, and Family history were reviewed  and updated as appropriate.   Please see review of systems for further details on the patient's review from today.   Objective:   Physical Exam:  There were no vitals taken for this visit.  Physical Exam Constitutional:      General: She is not in acute distress.    Appearance: She is well-developed. She is obese.  Musculoskeletal:        General: No deformity.  Neurological:     Mental Status: She is alert and oriented to person, place, and time.     Motor: No tremor.     Coordination: Coordination normal.     Gait: Gait normal.  Psychiatric:        Attention and Perception: She is attentive.        Mood and Affect: Mood is not anxious or depressed. Affect is not labile, blunt, angry, tearful or inappropriate.  Speech: Speech normal.        Behavior: Behavior normal.        Thought Content: Thought content normal. Thought content is not paranoid or delusional. Thought content does not include homicidal or suicidal ideation.        Judgment: Judgment normal.     Comments: Insight intact. No auditory or visual hallucinations. No delusions.       Lab Review:     Component Value Date/Time   NA 142 01/28/2023 0937   K 3.9 01/28/2023 0937   CL 102 01/28/2023 0937   CO2 26 01/28/2023 0937   GLUCOSE 178 (H) 01/28/2023 0937   GLUCOSE 124 (H) 03/05/2022 1144   BUN 18 01/28/2023 0937   CREATININE 0.93 01/28/2023 0937   CREATININE 0.70 12/15/2014 1949   CALCIUM 9.4 01/28/2023 0937   PROT 6.6 01/28/2023 0937   ALBUMIN 4.2 01/28/2023 0937   AST 19 01/28/2023 0937   ALT 23 01/28/2023 0937   ALKPHOS 65 01/28/2023 0937   BILITOT 0.4 01/28/2023 0937   GFRNONAA 80 06/04/2019 1038   GFRNONAA 80 10/27/2013 0910   GFRAA 93 06/04/2019 1038   GFRAA >89 10/27/2013 0910       Component Value Date/Time   WBC 4.2 02/25/2023 0845   RBC 4.00 02/25/2023 0845   HGB 11.6 (L) 02/25/2023 0845   HGB 13.3 09/05/2016 1202   HCT 35.2 (L) 02/25/2023 0845   HCT 42.1 09/05/2016 1202    PLT 139.0 (L) 02/25/2023 0845   MCV 87.9 02/25/2023 0845   MCV 89 09/05/2016 1202   MCH 27.8 07/13/2022 0856   MCHC 33.0 02/25/2023 0845   RDW 15.6 (H) 02/25/2023 0845   RDW 15.7 (H) 09/05/2016 1202   LYMPHSABS 1.0 02/25/2023 0845   LYMPHSABS 1.7 09/05/2016 1202   MONOABS 0.3 02/25/2023 0845   EOSABS 0.2 02/25/2023 0845   EOSABS 0.1 09/05/2016 1202   BASOSABS 0.0 02/25/2023 0845   BASOSABS 0.0 09/05/2016 1202    No results found for: "POCLITH", "LITHIUM"   No results found for: "PHENYTOIN", "PHENOBARB", "VALPROATE", "CBMZ"   .res Assessment: Plan:    Recurrent major depression in full remission (HCC) - Plan: buPROPion (WELLBUTRIN XL) 150 MG 24 hr tablet   [name] [name]'s depression is controlled and she is satisfied with the medications,  Failed attempt to reduce Welllbutrin to 300 mg daily, therefore continue 450 mg AM. Disc risk relapse.  Disc difference between situational dep and major dep.  She does not have major depression acitve now.  Disc marital frustrations.  Disc proper sleep amounts.  Feels better if doesn't oversleep.  Disc possible causes of fogginess in early part of the day.  Most likely clonidine, but could be Wellbutrin and could try splitting the dose.  15-minute appointment  FU 12 mos.  Meredith Staggers, MD, DFAPA  Please see After Visit Summary for patient specific instructions.  Future Appointments  Date Time Provider Department Center  09/02/2023  9:30 AM Eustaquio Boyden, MD [facility]  11/01/2023 10:30 AM Waymon Budge, MD [facility] None    No orders of the defined types were placed in this encounter.     -------------------------------

## 2023-06-07 DIAGNOSIS — R911 Solitary pulmonary nodule: Secondary | ICD-10-CM | POA: Diagnosis not present

## 2023-06-07 DIAGNOSIS — D649 Anemia, unspecified: Secondary | ICD-10-CM | POA: Diagnosis not present

## 2023-06-07 DIAGNOSIS — E119 Type 2 diabetes mellitus without complications: Secondary | ICD-10-CM | POA: Diagnosis not present

## 2023-06-07 DIAGNOSIS — K760 Fatty (change of) liver, not elsewhere classified: Secondary | ICD-10-CM | POA: Diagnosis not present

## 2023-06-07 DIAGNOSIS — D696 Thrombocytopenia, unspecified: Secondary | ICD-10-CM | POA: Diagnosis not present

## 2023-06-07 DIAGNOSIS — D126 Benign neoplasm of colon, unspecified: Secondary | ICD-10-CM | POA: Diagnosis not present

## 2023-06-07 DIAGNOSIS — R202 Paresthesia of skin: Secondary | ICD-10-CM | POA: Diagnosis not present

## 2023-06-07 DIAGNOSIS — Z87891 Personal history of nicotine dependence: Secondary | ICD-10-CM | POA: Diagnosis not present

## 2023-06-07 DIAGNOSIS — R200 Anesthesia of skin: Secondary | ICD-10-CM | POA: Diagnosis not present

## 2023-06-07 DIAGNOSIS — E559 Vitamin D deficiency, unspecified: Secondary | ICD-10-CM | POA: Diagnosis not present

## 2023-06-07 DIAGNOSIS — R799 Abnormal finding of blood chemistry, unspecified: Secondary | ICD-10-CM | POA: Diagnosis not present

## 2023-06-07 DIAGNOSIS — I2510 Atherosclerotic heart disease of native coronary artery without angina pectoris: Secondary | ICD-10-CM | POA: Diagnosis not present

## 2023-06-07 DIAGNOSIS — I10 Essential (primary) hypertension: Secondary | ICD-10-CM | POA: Diagnosis not present

## 2023-06-07 DIAGNOSIS — R946 Abnormal results of thyroid function studies: Secondary | ICD-10-CM | POA: Diagnosis not present

## 2023-06-13 ENCOUNTER — Other Ambulatory Visit: Payer: Self-pay | Admitting: Family Medicine

## 2023-06-13 DIAGNOSIS — K760 Fatty (change of) liver, not elsewhere classified: Secondary | ICD-10-CM

## 2023-06-18 ENCOUNTER — Ambulatory Visit
Admission: RE | Admit: 2023-06-18 | Discharge: 2023-06-18 | Disposition: A | Discharge status: Home / Self Care | Payer: Medicare Other | Source: Ambulatory Visit | Attending: Family Medicine | Admitting: Family Medicine

## 2023-06-18 DIAGNOSIS — K760 Fatty (change of) liver, not elsewhere classified: Secondary | ICD-10-CM | POA: Insufficient documentation

## 2023-06-21 NOTE — Assessment & Plan Note (Addendum)
 Hopefully benign. Plan- ongoing surveillance with CT update pending at 6 month (10/2023)

## 2023-06-21 NOTE — Assessment & Plan Note (Signed)
 Acute bronchitis Plan- finish doxycycline

## 2023-06-21 NOTE — Assessment & Plan Note (Signed)
Benefits from CPAP with good compliance and control Plan- continue auto 5-15 

## 2023-07-30 DIAGNOSIS — M5412 Radiculopathy, cervical region: Secondary | ICD-10-CM | POA: Diagnosis not present

## 2023-07-30 DIAGNOSIS — R202 Paresthesia of skin: Secondary | ICD-10-CM | POA: Diagnosis not present

## 2023-07-30 DIAGNOSIS — R200 Anesthesia of skin: Secondary | ICD-10-CM | POA: Diagnosis not present

## 2023-08-12 ENCOUNTER — Telehealth: Payer: Self-pay

## 2023-08-12 NOTE — Telephone Encounter (Signed)
 Spoke with patient. She was requesting to schedule 6 month f/u scan. Scheduled for 10/25/2023 at 12:30pm at GI. Initially scheduled for 5/16 at 11:30am, called back and LVM. Advised time was changed to 12:30pm due to appt type.

## 2023-08-27 DIAGNOSIS — L218 Other seborrheic dermatitis: Secondary | ICD-10-CM | POA: Diagnosis not present

## 2023-09-02 ENCOUNTER — Ambulatory Visit: Payer: PPO | Admitting: Family Medicine

## 2023-09-04 ENCOUNTER — Other Ambulatory Visit: Payer: Self-pay | Admitting: Cardiology

## 2023-09-17 DIAGNOSIS — M1812 Unilateral primary osteoarthritis of first carpometacarpal joint, left hand: Secondary | ICD-10-CM | POA: Diagnosis not present

## 2023-09-17 DIAGNOSIS — G5603 Carpal tunnel syndrome, bilateral upper limbs: Secondary | ICD-10-CM | POA: Diagnosis not present

## 2023-10-07 DIAGNOSIS — R799 Abnormal finding of blood chemistry, unspecified: Secondary | ICD-10-CM | POA: Diagnosis not present

## 2023-10-07 DIAGNOSIS — E785 Hyperlipidemia, unspecified: Secondary | ICD-10-CM | POA: Diagnosis not present

## 2023-10-07 DIAGNOSIS — R946 Abnormal results of thyroid function studies: Secondary | ICD-10-CM | POA: Diagnosis not present

## 2023-10-07 DIAGNOSIS — D126 Benign neoplasm of colon, unspecified: Secondary | ICD-10-CM | POA: Diagnosis not present

## 2023-10-07 DIAGNOSIS — M7989 Other specified soft tissue disorders: Secondary | ICD-10-CM | POA: Diagnosis not present

## 2023-10-07 DIAGNOSIS — K760 Fatty (change of) liver, not elsewhere classified: Secondary | ICD-10-CM | POA: Diagnosis not present

## 2023-10-07 DIAGNOSIS — I2510 Atherosclerotic heart disease of native coronary artery without angina pectoris: Secondary | ICD-10-CM | POA: Diagnosis not present

## 2023-10-07 DIAGNOSIS — T148XXA Other injury of unspecified body region, initial encounter: Secondary | ICD-10-CM | POA: Diagnosis not present

## 2023-10-07 DIAGNOSIS — E119 Type 2 diabetes mellitus without complications: Secondary | ICD-10-CM | POA: Diagnosis not present

## 2023-10-07 DIAGNOSIS — I10 Essential (primary) hypertension: Secondary | ICD-10-CM | POA: Diagnosis not present

## 2023-10-08 DIAGNOSIS — M7989 Other specified soft tissue disorders: Secondary | ICD-10-CM | POA: Diagnosis not present

## 2023-10-08 DIAGNOSIS — I82812 Embolism and thrombosis of superficial veins of left lower extremities: Secondary | ICD-10-CM | POA: Diagnosis not present

## 2023-10-08 DIAGNOSIS — I83892 Varicose veins of left lower extremities with other complications: Secondary | ICD-10-CM | POA: Diagnosis not present

## 2023-10-09 ENCOUNTER — Other Ambulatory Visit: Payer: Self-pay | Admitting: Cardiology

## 2023-10-10 DIAGNOSIS — I10 Essential (primary) hypertension: Secondary | ICD-10-CM | POA: Diagnosis not present

## 2023-10-10 DIAGNOSIS — E119 Type 2 diabetes mellitus without complications: Secondary | ICD-10-CM | POA: Diagnosis not present

## 2023-10-10 DIAGNOSIS — E785 Hyperlipidemia, unspecified: Secondary | ICD-10-CM | POA: Diagnosis not present

## 2023-10-10 DIAGNOSIS — K760 Fatty (change of) liver, not elsewhere classified: Secondary | ICD-10-CM | POA: Diagnosis not present

## 2023-10-16 ENCOUNTER — Encounter: Payer: Self-pay | Admitting: Cardiology

## 2023-10-16 DIAGNOSIS — M79605 Pain in left leg: Secondary | ICD-10-CM

## 2023-10-16 DIAGNOSIS — R600 Localized edema: Secondary | ICD-10-CM

## 2023-10-25 ENCOUNTER — Ambulatory Visit

## 2023-10-25 ENCOUNTER — Ambulatory Visit
Admission: RE | Admit: 2023-10-25 | Discharge: 2023-10-25 | Disposition: A | Discharge status: Home / Self Care | Source: Ambulatory Visit | Attending: Acute Care | Admitting: Acute Care

## 2023-10-25 DIAGNOSIS — J439 Emphysema, unspecified: Secondary | ICD-10-CM | POA: Diagnosis not present

## 2023-10-25 DIAGNOSIS — I3139 Other pericardial effusion (noninflammatory): Secondary | ICD-10-CM | POA: Diagnosis not present

## 2023-10-25 DIAGNOSIS — Z87891 Personal history of nicotine dependence: Secondary | ICD-10-CM | POA: Diagnosis not present

## 2023-10-25 DIAGNOSIS — R911 Solitary pulmonary nodule: Secondary | ICD-10-CM | POA: Diagnosis not present

## 2023-10-25 DIAGNOSIS — J929 Pleural plaque without asbestos: Secondary | ICD-10-CM | POA: Diagnosis not present

## 2023-10-28 ENCOUNTER — Encounter: Payer: Self-pay | Admitting: Internal Medicine

## 2023-10-30 ENCOUNTER — Other Ambulatory Visit: Payer: Self-pay

## 2023-10-30 DIAGNOSIS — L218 Other seborrheic dermatitis: Secondary | ICD-10-CM | POA: Diagnosis not present

## 2023-10-30 DIAGNOSIS — I872 Venous insufficiency (chronic) (peripheral): Secondary | ICD-10-CM

## 2023-10-30 DIAGNOSIS — L738 Other specified follicular disorders: Secondary | ICD-10-CM | POA: Diagnosis not present

## 2023-10-30 NOTE — Progress Notes (Signed)
 HPI-  female former smoker followed for OSA, complicated by allergic rhinitis, CAD/MI/stent, obesity, depression, DM 2, HBP, GERD, Allergic Rhinitis,  NPSG 11/07/04 Severe OSA, AHI 45/ hr, CPAP 10.  PFT 05/03/23-Within lower limits of normal. Insignificant response to bronchodilator Airtrapping without overinflation Minimal Diffusion Defect ==============================================   01/22/23- 73 year old female former smoker (50 pkyrs) followed for OSA, RLL NOdule, complicated by allergic rhinitis, CAD/MI/stent, obesity, depression, DM 2, HBP, GERD,  CPAP 5-15/ [employer]     AirSense 11 AutoSet Download compliance-97%, AHI 2.9/ hr Body weight today-182 lbs -----Wearing CPAP-leaking around mask at times Download reviewed. Will ask mask refit, but otherwise does well with CPAP. - Lung nodule discussed. Understands recommendation for f/u CT. We will also get PFT. Breathing is comfortable. CT chest Low dose screening 01/15/23-   NOTE IMPRESSION: 1. New irregular subpleural nodular opacity of the right lower lobe measuring 6.3 mm, short-term follow-up is recommended. Lung-RADS 4A, suspicious. Follow up low-dose chest CT without contrast in 3 months (please use the following order, CT CHEST LCS NODULE FOLLOW-UP W/OCM) is recommended. Alternatively, PET may be considered when there is a solid component 8mm or larger. 2. Coronary artery calcifications, aortic Atherosclerosis (ICD10-I70.0) and Emphysema (ICD10-J43.9).  05/03/23- 72 year old female former smoker (50 pkyrs) followed for OSA, RLL Nodule, complicated by allergic rhinitis, CAD/MI/stent, obesity, depression, DM 2, HBP, GERD,  - Albuterol  hfa,  CPAP 5-15/ [employer]     AirSense 11 AutoSet Download compliance-100%, AHI 1.7/hr Body weight today-187 lbs Download reviewed. Doing well with CPAP. Had flu vax. No recent need for rescue inhaler, but has one. Recent bronchitis treated with doxycycline . PFT 05/03/23-Within lower limits of normal.  Insignificant response to bronchodilator Airtrapping without overinflation Minimal Diffusion Defect CT chest LCS Nodule f/u 04/24/23- IMPRESSION: Lung-RADS 3, probably benign findings. Short-term follow-up in 6 months is recommended with repeat low-dose chest CT without contrast (please use the following order, CT CHEST LCS NODULE FOLLOW-UP W/O CM).  New 5.0 mm irregular right lower lobe nodule, favoring infection/inflammation. Aortic Atherosclerosis (ICD10-I70.0) and Emphysema (ICD10-J43.9).  11/01/23- 73 year old female former smoker (50 pkyrs) followed for OSA, RLL Nodule, complicated by allergic rhinitis, CAD/MI/stent, obesity, depression, DM 2, HBP, GERD,  - Albuterol  hfa,  CPAP 5-15/ [employer]     AirSense 11 AutoSet Download compliance- 97%, AHI 2.3/hr Body weight today-183 lbs Download reviewed. CT chest Low Dose screen- 10/25/23   report pend Discussed the use of AI scribe software for clinical note transcription with the patient, who gave verbal consent to proceed.  History of Present Illness   [name] [name] "Stephenie Einstein" is a 73 year old female who presents with a persistent cough.  The cough began last Sunday as a 'tickle' with acute bronchitis and progressed to a loose cough, peaking in severity on Monday. It has improved over the past week, but she still feels an inability to clear her chest. She is able to expectorate mucous, which appears non-worrisome. There is no fever or sore throat. She may clear without treatment, but we discussed providing doxycycline  to hold because of long weekend.  She has a history of smoking. She has not been using her inhaler. We are awaiting report on her latest low dose screening chest CT.  She uses a CPAP machine for sleep apnea, with settings between 5 and 15, experiencing less than three breakthrough apneas per hour. Her oxygen saturation was 92% during a recent check-up, improving with deep breathing.     Assessment and Plan:    Acute  bronchitis Likely  mild viral bronchitis, improving, self-limiting. - Prescribed doxycycline  for use if symptoms worsen or persist. - Advised inhaler use as needed.  Obstructive sleep apnea Well-managed with CPAP therapy, effective settings with acceptable apnea rate. - Continue current CPAP settings.     ROS-see HPI   + = positive Constitutional:    weight loss, night sweats, fevers, chills, +fatigue, lassitude. HEENT:    headaches, difficulty swallowing, tooth/dental problems, sore throat,       sneezing, itching, ear ache, nasal congestion, post nasal drip, snoring CV:    chest pain, orthopnea, PND, swelling in lower extremities, anasarca,                                   dizziness, palpitations Resp:   shortness of breath with exertion or at rest.                productive cough,   +non-productive cough, coughing up of blood.              change in color of mucus.  wheezing.   Skin:    rash or lesions. GI: + heartburn, indigestion, +abdominal pain, nausea, vomiting, diarrhea,                 change in bowel habits, loss of appetite GU: dysuria, change in color of urine, no urgency or frequency.   flank pain. MS:   +joint pain, stiffness, decreased range of motion, back pain. Neuro-     nothing unusual Psych:  change in mood or affect.  +depression or anxiety.   memory loss.  OBJ- Physical Exam General- Alert, Oriented, Affect-appropriate, Distress- none acute, + obese Skin- rash-none, lesions- none, excoriation- none Lymphadenopathy- none Head- atraumatic            Eyes- Gross vision intact, PERRLA, conjunctivae and secretions clear            Ears- Hearing, canals-normal            Nose- Clear, no-Septal dev, mucus, polyps, erosion, perforation             Throat- Mallampati IV , mucosa clear , drainage- none, tonsils- atrophic Neck- flexible , trachea midline, no stridor , thyroid  nl, carotid no bruit Chest - symmetrical excursion , unlabored           Heart/CV- RRR ,   murmur+1/6 RUSB , no gallop  , no rub, nl s1 s2                           - JVD- none , edema- none, stasis changes- none, varices- none           Lung- +coarse in bases, wheeze- none, cough+slight , dullness-none, rub- none           Chest wall-  Abd-  Br/ Gen/ Rectal- Not done, not indicated Extrem- +superficial varices-legs Neuro- grossly intact to observation

## 2023-11-01 ENCOUNTER — Encounter: Payer: Self-pay | Admitting: Internal Medicine

## 2023-11-01 ENCOUNTER — Ambulatory Visit (INDEPENDENT_AMBULATORY_CARE_PROVIDER_SITE_OTHER): Payer: PPO | Admitting: Internal Medicine

## 2023-11-01 VITALS — BP 114/66 | HR 63 | Ht 63.0 in | Wt 183.8 lb

## 2023-11-01 DIAGNOSIS — G4733 Obstructive sleep apnea (adult) (pediatric): Secondary | ICD-10-CM | POA: Diagnosis not present

## 2023-11-01 DIAGNOSIS — J209 Acute bronchitis, unspecified: Secondary | ICD-10-CM

## 2023-11-01 MED ORDER — DOXYCYCLINE HYCLATE 100 MG PO TABS: 100.0000 mg | ORAL_TABLET | Freq: Two times a day (BID) | ORAL | 0 refills | Status: DC

## 2023-11-01 NOTE — Patient Instructions (Signed)
 Script for doxycycline  antibiotic sent to drug store. You can hold on to it in case your bronchitis doesn't clear up  We can continue CPAP auto 5-15. You are doing great with this.  We will call you with the report of your screening CT scan when we get it from Radiology.

## 2023-11-08 ENCOUNTER — Ambulatory Visit (HOSPITAL_COMMUNITY)
Admission: RE | Admit: 2023-11-08 | Discharge: 2023-11-08 | Disposition: A | Discharge status: Home / Self Care | Source: Ambulatory Visit | Attending: Vascular Surgery | Admitting: Vascular Surgery

## 2023-11-08 DIAGNOSIS — I872 Venous insufficiency (chronic) (peripheral): Secondary | ICD-10-CM

## 2023-11-12 ENCOUNTER — Encounter: Payer: Self-pay | Admitting: Internal Medicine

## 2023-11-19 ENCOUNTER — Other Ambulatory Visit: Payer: Self-pay

## 2023-11-24 ENCOUNTER — Other Ambulatory Visit: Payer: Self-pay | Admitting: Cardiology

## 2023-11-24 DIAGNOSIS — E7800 Pure hypercholesterolemia, unspecified: Secondary | ICD-10-CM

## 2023-11-26 DIAGNOSIS — K760 Fatty (change of) liver, not elsewhere classified: Secondary | ICD-10-CM | POA: Diagnosis not present

## 2023-11-26 DIAGNOSIS — E119 Type 2 diabetes mellitus without complications: Secondary | ICD-10-CM | POA: Diagnosis not present

## 2023-11-28 NOTE — Progress Notes (Signed)
 VASCULAR & VEIN SPECIALISTS           OF [place]  History and Physical   [name] [name] is a 73 y.o. female who presents with LLE swelling.    She states that she gets some swelling in the left leg more than right.  She notices when she wears pants, they are tighter on the left.  The swelling is not really bothersome.  Improves somewhat after being in bed overnight.  She does not have any hx of DVT.  There is no family hx of varicose veins.  She does not have skin color changes. She has not had any procedures on her veins.  She does not wear compression socks.  She states that she has some back issues so she does not sit or stand for long periods of time.  She does have some superficial small varicosities on her anterior shins.  These have never bleed.  She does have some burning in legs at times and some neuropathy in her feet.    She quit smoking about 11 years ago.   She has hx of CAD/MI with stenting, OSA, DM, HLD, depression, GERD.   She is followed by pulmonary and wsa found to have a new 5mm irregular RLL nodule.   The pt is on a statin for cholesterol management.  The pt is on a daily aspirin.   Other AC:  none The pt is on CCB, BB, ARB, diuretic for hypertension.   The pt is  on medication for diabetes.   Tobacco hx:  former  Pt does not have family hx of AAA.  Past Medical History:  Diagnosis Date   Acquired deformity of right foot    Back pain    CAD (coronary artery disease) primary cardioloigst-  dr pietro   hx NSTEMI 10-24-2001  s/p  cardiac cath w/ PCI and DES x1 to first diagonal   Chronic constipation    Constipation    COVID-19 06/02/2020   Depression    Fatty liver 04/26/2015   GERD (gastroesophageal reflux disease)    History of abnormal cervical Pap smear    ACUS 2012   History of exercise stress test 05-25-2014  dr pietro   borderline (indeterminate) with non-specific ST changes (mild ST depression in leads II,III, aVF, & V6, did not  meet criteria for ischemia) ;  pt had low intolerence, no cp, normal heartrate and bp response, no arrhythmias   History of goiter    as child s/p  removal ,  per pt benign , neck area  (not thyroid )   History of heart attack    History of non-ST elevation myocardial infarction (NSTEMI) 10/24/2001   s/p  PCI and DES to first diagonal   Hyperlipidemia    Hypertension    Joint pain    OSA on CPAP pulmoloigst-  dr young   per study 11-07-2004  severe osa   Right foot pain    S/P drug eluting coronary stent placement 10/27/2001   x1 to first diagonal    Seasonal and perennial allergic rhinitis    Sleep apnea    SOB (shortness of breath)    Type 2 diabetes mellitus (HCC)    Vitamin D  deficiency    Wears dentures    upper and lower partial denture   Wears glasses     Past Surgical History:  Procedure Laterality Date   BUNIONECTOMY Left 2012   BUNIONECTOMY Right 04/19/2017  Procedure: RIGHT BUNIONECTOMY;  Surgeon: Zan Factor, DPM;  Location: [facility];  Service: Podiatry;  Laterality: Right;   CARDIAC CATHETERIZATION  09-11-2002   dr morris   well-preserved LVF; continued patency previouly placed stent in diagonal branch;  mild luminal irregularities    CARDIOVASCULAR STRESS TEST  05-16-2011   dr pietro   normal nuclear study w/ no evidence ischemia/  normal LV function and wall motion , ef 77%   COLONOSCOPY  06/2021   TAs, diverticulosis, int hem, rpt 3 yrs ([facility])   CORONARY ANGIOPLASTY WITH STENT PLACEMENT  10-27-2001   dr morris   high-grade stenosis first diagonal post PCI and DES (TAXUS);  mild luminal irregularity throughout LAD, RCA, and very mild CFx;  preserved LVF   goiter removal  1968   per pt benign tumor removed from neck beside throat   HALLUX VALGUS LAPIDUS Right 04/19/2017   Procedure: HALLUX VALGUS LAPIDUS FUSION;  Surgeon: Zan Factor, DPM;  Location: [facility];  Service: Podiatry;  Laterality: Right;   HAMMER TOE  SURGERY Right 04/19/2017   Procedure: HAMMER TOE CORRECTION RIGHT 2ND;  Surgeon: Zan Factor, DPM;  Location: [facility];  Service: Podiatry;  Laterality: Right;   METATARSAL OSTEOTOMY Right 04/19/2017   Procedure: 2ND METATARSAL OSTEOTOMY;  Surgeon: Zan Factor, DPM;  Location: [facility];  Service: Podiatry;  Laterality: Right;   TONSILLECTOMY AND ADENOIDECTOMY  age 65   TUBAL LIGATION Bilateral 1985    Social History   Socioeconomic History   Marital status: Married    Spouse name: Lynwood   Number of children: 3   Years of education: Not on file   Highest education level: 12th grade  Occupational History   Occupation: Data processing manager    Comment: credit Copywriter, advertising: [employer]   Occupation: Retired  Tobacco Use   Smoking status: Former    Current packs/day: 0.00    Average packs/day: 1.5 packs/day for 48.0 years (72.0 ttl pk-yrs)    Types: Cigarettes    Start date: 09/23/1964    Quit date: 09/23/2012    Years since quitting: 11.1   Smokeless tobacco: Never  Vaping Use   Vaping status: Never Used  Substance and Sexual Activity   Alcohol use: Yes    Alcohol/week: 0.0 standard drinks of alcohol    Comment: occasional wine   Drug use: No   Sexual activity: Not Currently  Other Topics Concern   Not on file  Social History Narrative   Caffeine Use:  2 cups coffee daily   Regular exercise:  No   Lives with husband    Occ: retired, 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    Activity: no regular activity    Diet: good water, fruits/vegetables daily    Social Drivers of Corporate investment banker Strain: Low Risk  (02/28/2023)   Overall Financial Resource Strain (CARDIA)    Difficulty of Paying Living Expenses: Not hard at all  Food Insecurity: No Food Insecurity (10/07/2023)   Received from [facility]   Hunger Vital Sign    Within the past 12 months, you worried that your food would run out before  you got the money to buy more.: Never true    Within the past 12 months, the food you bought just didn't last and you didn't have money to get more.: Never true  Transportation Needs: No Transportation Needs (10/07/2023)   [facility]   PRAPARE - Transportation    Lack of Transportation (Medical): No    Lack of Transportation (Non-Medical): No  Physical Activity: Insufficiently Active (02/28/2023)   Exercise Vital Sign    Days of Exercise per Week: 2 days    Minutes of Exercise per Session: 20 min  Stress: No Stress Concern Present (02/28/2023)   [employer] of Occupational Health - Occupational Stress Questionnaire    Feeling of Stress : Only a little  Social Connections: Socially Integrated (02/28/2023)   Social Connection and Isolation Panel    Frequency of Communication with Friends and Family: More than three times a week    Frequency of Social Gatherings with Friends and Family: More than three times a week    Attends Religious Services: More than 4 times per year    Active Member of [employer] or Organizations: Yes    Attends Engineer, structural: More than 4 times per year    Marital Status: Married  Catering manager Violence: Not At Risk (03/01/2022)   Humiliation, Afraid, Rape, and Kick questionnaire    Fear of Current or Ex-Partner: No    Emotionally Abused: No    Physically Abused: No    Sexually Abused: No     Family History  Problem Relation Age of Onset   Esophageal cancer Mother    Cancer Mother        throat, cervical   Diabetes Mother    Stroke Mother    Heart attack Mother    Heart disease Mother    Hypertension Mother    Thyroid  disease Mother    Alcoholism Mother    Liver disease Mother    Cancer Father        prostate   Diabetes Sister    Heart attack Sister    Hypertension Sister    Hyperlipidemia Sister    Colon cancer Neg Hx    Breast cancer Neg Hx    Colon polyps Neg Hx    Stomach cancer Neg Hx    Rectal cancer Neg  Hx     Current Outpatient Medications  Medication Sig Dispense Refill   albuterol  (VENTOLIN  HFA) 108 (90 Base) MCG/ACT inhaler Inhale 1-2 puffs into the lungs every 6 (six) hours as needed for wheezing or shortness of breath. 1 each 0   amLODipine  (NORVASC ) 5 MG tablet Take 1 tablet (5 mg total) by mouth daily. 90 tablet 0   aspirin 81 MG tablet Take 81 mg by mouth daily.     benzonatate  (TESSALON ) 200 MG capsule Take 1 capsule (200 mg total) by mouth 3 (three) times daily as needed for cough. Swallow whole 30 capsule 1   blood glucose meter kit and supplies KIT Dispense based on patient and insurance preference. Use up to four times daily as directed. (FOR ICD-9 250.00, 250.01). 1 each 0   buPROPion  (WELLBUTRIN  XL) 150 MG 24 hr tablet Take 3 tablets (450 mg total) by mouth daily. 270 tablet 3   Calcium  Carbonate-Vit D-Min (RA CALCIUM  600/VIT D/MINERALS) 600-400 MG-UNIT CHEW Chew 1 tablet by mouth daily.     carvedilol  (COREG ) 12.5 MG tablet Take 1 tablet (12.5 mg total) by mouth 2 (two) times daily with a meal. TAKE 1 AND 1/2 TABLETS(18.75 MG) BY MOUTH TWICE DAILY 270 tablet 1   Cholecalciferol (VITAMIN D3) 3000 units TABS Take 1 tablet by mouth daily. 30 tablet    doxycycline  (VIBRA -TABS) 100 MG tablet Take 1 tablet (100 mg total) by mouth  2 (two) times daily. 14 tablet 0   Dulaglutide  (TRULICITY ) 0.75 MG/0.5ML SOPN Inject 0.75 mg into the skin once a week. 2 mL 11   fluticasone  (FLONASE ) 50 MCG/ACT nasal spray Place 2 sprays into both nostrils daily. 16 g 12   glucose blood (ONETOUCH ULTRA) test strip TEST BLOOD SUGAR ONCE DAILY 100 strip 3   hydrALAZINE  (APRESOLINE ) 100 MG tablet Take 1 tablet (100 mg total) by mouth 3 (three) times daily. 270 tablet 0   hydrocortisone 2.5 % cream Apply topically 2 (two) times daily.     Iron , Ferrous Sulfate , 325 (65 Fe) MG TABS Take 325 mg by mouth every Monday, Wednesday, and Friday.     ketoconazole (NIZORAL) 2 % cream Apply topically daily.      ketoconazole (NIZORAL) 2 % shampoo Apply topically 3 (three) times a week.     Lancets (ONETOUCH DELICA PLUS LANCET33G) MISC USE AS INSTRUCTED TO CHECK BLOOD SUGAR ONCE A DAY 100 each 4   linaclotide  (LINZESS ) 145 MCG CAPS capsule Take 1 capsule (145 mcg total) by mouth daily before breakfast. 30 capsule 6   metFORMIN  (GLUCOPHAGE -XR) 500 MG 24 hr tablet Take 1 tablet (500 mg total) by mouth every other day. 45 tablet 4   Multiple Vitamins-Minerals (MULTIVITAMIN ADULTS 50+ PO) Take 1 tablet every morning by mouth.      olmesartan -hydrochlorothiazide  (BENICAR  HCT) 40-12.5 MG tablet TAKE 1 TABLET BY MOUTH DAILY 90 tablet 1   pantoprazole  (PROTONIX ) 40 MG tablet Take 1 tablet (40 mg total) by mouth daily. 90 tablet 4   polyethylene glycol (MIRALAX  / GLYCOLAX ) 17 g packet Take 17 g by mouth daily.     Probiotic Product (PROBIOTIC DAILY PO) Take 1 capsule by mouth every morning.     rosuvastatin  (CRESTOR ) 20 MG tablet TAKE 1 TABLET(20 MG) BY MOUTH DAILY 90 tablet 1   No current facility-administered medications for this visit.    No Known Allergies  REVIEW OF SYSTEMS:   [X]  denotes positive finding, [ ]  denotes negative finding Cardiac  Comments:  Chest pain or chest pressure:    Shortness of breath upon exertion:    Short of breath when lying flat:    Irregular heart rhythm:        Vascular    Pain in calf, thigh, or hip brought on by ambulation:    Pain in feet at night that wakes you up from your sleep:     Blood clot in your veins:    Leg swelling:  x       Pulmonary    Oxygen at home:    Productive cough:     Wheezing:         Neurologic    Sudden weakness in arms or legs:     Sudden numbness in arms or legs:     Sudden onset of difficulty speaking or slurred speech:    Temporary loss of vision in one eye:     Problems with dizziness:         Gastrointestinal    Blood in stool:     Vomited blood:         Genitourinary    Burning when urinating:     Blood in urine:         Psychiatric    Major depression:         Hematologic    Bleeding problems:    Problems with blood clotting too easily:        Skin  Rashes or ulcers:        Constitutional    Fever or chills:      PHYSICAL EXAMINATION:  Today's Vitals   11/29/23 0827  BP: 116/70  Pulse: 66  Temp: 98.1 F (36.7 C)  TempSrc: Temporal  SpO2: 97%  Weight: 183 lb (83 kg)  Height: 5' 3 (1.6 m)  PainSc: 0-No pain   Body mass index is 32.42 kg/m.   General:  WDWN in NAD; vital signs documented above Gait: Not observed HENT: WNL, normocephalic Pulmonary: normal non-labored breathing without wheezing Cardiac: regular HR; without carotid bruits Abdomen: soft, NT, aortic pulse is not palpable Skin: without rashes Vascular Exam/Pulses:  Right Left  Radial 2+ (normal) 2+ (normal)  DP 2+ (normal) 2+ (normal)   Extremities: mild LLE edema.  Small superficial varicosity on bilateral anterior shin.  Neurologic: A&O X 3;  moving all extremities equally Psychiatric:  The pt has Normal affect.   Non-Invasive Vascular Imaging:   Venous duplex on 11/08/2023: +------------------------+---------+------+----------+------------+--------  LEFT                   Reflux NoReflux  Reflux  Diameter  cmsComments                                        Yes     Time                         +------------------------+---------+------+----------+------------+--------  CFV                              yes  >1 second                      +------------------------+---------+------+----------+------------+--------  FV prox                 no                                            +------------------------+---------+------+----------+------------+--------  FV mid                  no                                            +------------------------+---------+------+----------+------------+--------  FV dist                 no                                             +------------------------+---------+------+----------+------------+--------  Popliteal              no                                            +------------------------+---------+------+----------+------------+--------  GSV at [employer]  yes   >500 ms      0.69             +------------------------+---------+------+----------+------------+--------  GSV prox thigh                    yes   >500 ms      0.85             +------------------------+---------+------+----------+------------+--------   GSV mid thigh                     yes   >500 ms      0.52     out of fascia   +------------------------+---------+------+----------+------------+--------  GSV dist thigh                    yes   >500 ms      0.53     out of  fascia   +------------------------+---------+------+----------+------------+--------  GSV at knee                       yes   >500 ms      0.55     out of  fascia   +------------------------+---------+------+----------+------------+--------  GSV prox calf                     yes   >500 ms      0.56             +------------------------+---------+------+----------+------------+--------  GSV mid calf                      yes   >500 ms      0.43             +------------------------+---------+------+----------+------------+--------  GSV dist calf                     yes   >500 ms      0.31             +------------------------+---------+------+----------+------------+--------  SSV Pop Fossa           no                           0.14             +------------------------+---------+------+----------+------------+--------  Medial mid calf                   yes   >500 ms      0.32                varicosity                                                            +------------------------+---------+------+----------+------------+--------  Distal calf perforator            yes   >500 ms       0.50             +------------------------+---------+------+----------+------------+--------   Summary:  Left:  - No evidence of deep vein thrombosis seen in the left lower extremity,  from the common femoral through the popliteal veins.  - Venous reflux is noted in the left common femoral vein.  -  Venous reflux is noted in the left sapheno-femoral junction.  - Venous reflux is noted in the left greater saphenous vein in the thigh.  - Venous reflux is noted in the left greater saphenous vein in the calf  and associated varicosities.  - Venous reflux is noted in the left perforator vein (distal calf)     Camylle Whicker is a 73 y.o. female who presents with: LLE swelling    -pt has easily palpable DP pedal pulses bilaterally.  She has mild edema in the LLE.  -pt does not have evidence of DVT.  Pt does have venous reflux in the left deep CFV and the GSV at the [facility] and throughout the GSV that measures 0.43cm-0.85cm -she most likely would qualify for a laser ablation, however, her symptoms are tolerable and she does not want to consider at this time therefore, I discussed with pt about wearing knee high 15-20 mmHg compression stockings and pt was measured for these today.    -discussed the importance of leg elevation and how to elevate properly - pt is advised to elevate their legs and a diagram is given to them to demonstrate for pt to lay flat on their back with knees elevated and slightly bent with their feet higher than their knees, which puts their feet higher than their heart for 15 minutes per day.  If pt cannot lay flat, advised to lay as flat as possible.  Discussed that if her superficial varicosities were to ever bleed, she should elevate her leg as discussed above and hold pressure for ~ 20 minutes.  She expressed good understanding.  -pt is advised to continue as much walking as possible and avoid sitting or standing for long periods of time.  -discussed importance of weight loss  and exercise and that water aerobics would also be beneficial. She does have access to a pool.   -handout with recommendations given -pt will f/u as needed.  She knows that if her symptoms worsen, we will be glad to see her back any time.     Lucie Apt, [facility] [phone]  Clinic MD:  Pearline

## 2023-11-29 ENCOUNTER — Encounter: Payer: Self-pay | Admitting: Physician Assistant

## 2023-11-29 ENCOUNTER — Ambulatory Visit: Attending: Vascular Surgery | Admitting: Physician Assistant

## 2023-11-29 VITALS — BP 116/70 | HR 66 | Temp 98.1°F | Ht 63.0 in | Wt 183.0 lb

## 2023-11-29 DIAGNOSIS — I872 Venous insufficiency (chronic) (peripheral): Secondary | ICD-10-CM

## 2023-11-29 DIAGNOSIS — M7989 Other specified soft tissue disorders: Secondary | ICD-10-CM

## 2023-12-03 ENCOUNTER — Other Ambulatory Visit: Payer: Self-pay | Admitting: Acute Care

## 2023-12-03 DIAGNOSIS — Z87891 Personal history of nicotine dependence: Secondary | ICD-10-CM

## 2023-12-03 DIAGNOSIS — Z122 Encounter for screening for malignant neoplasm of respiratory organs: Secondary | ICD-10-CM

## 2023-12-11 ENCOUNTER — Other Ambulatory Visit: Payer: Self-pay | Admitting: Cardiology

## 2023-12-17 DIAGNOSIS — M1812 Unilateral primary osteoarthritis of first carpometacarpal joint, left hand: Secondary | ICD-10-CM | POA: Diagnosis not present

## 2023-12-17 DIAGNOSIS — G5603 Carpal tunnel syndrome, bilateral upper limbs: Secondary | ICD-10-CM | POA: Diagnosis not present

## 2024-01-28 ENCOUNTER — Other Ambulatory Visit: Payer: Self-pay | Admitting: Cardiology

## 2024-01-29 ENCOUNTER — Telehealth: Payer: Self-pay | Admitting: Internal Medicine

## 2024-01-29 DIAGNOSIS — G4733 Obstructive sleep apnea (adult) (pediatric): Secondary | ICD-10-CM

## 2024-01-29 NOTE — Telephone Encounter (Signed)
 Will place order for CPAP supplies.  Patient last OV 11/01/2023. Okay per Dr. Neysa.  DME is [employer]. Called patient to inform that the order has been submitted and it may take a few days to process.

## 2024-01-29 NOTE — Telephone Encounter (Signed)
 Copied from CRM [id number]. Topic: Clinical - Order For Equipment >> Jan 29, 2024  2:46 PM Joesph PARAS wrote: Reason for CRM: Patient is calling to state insurance has changed so she needs a prescription for CPAP supplies (updated) to be faxed to them at [phone]. Company is [employer].  Patient requesting to be notified when it has been faxed.

## 2024-01-30 ENCOUNTER — Encounter: Payer: Self-pay | Admitting: Cardiology

## 2024-01-31 ENCOUNTER — Other Ambulatory Visit: Payer: Self-pay | Admitting: Cardiology

## 2024-01-31 MED ORDER — AMLODIPINE BESYLATE 5 MG PO TABS: 5.0000 mg | ORAL_TABLET | Freq: Every day | ORAL | 0 refills | Status: DC

## 2024-02-03 ENCOUNTER — Other Ambulatory Visit: Payer: Self-pay

## 2024-02-03 MED ORDER — AMLODIPINE BESYLATE 5 MG PO TABS: 5.0000 mg | ORAL_TABLET | Freq: Every day | ORAL | 0 refills | Status: DC

## 2024-02-06 ENCOUNTER — Encounter: Payer: Self-pay | Admitting: Internal Medicine

## 2024-02-07 NOTE — Telephone Encounter (Addendum)
 Hello [facility] team!  Patient is calling to get an update on her CPAP supplies order.  We placed this on 01/29/2024.  Patient states she is really in need of her CPAP supplies.  Please advise.  See MyChart patient message from 02/06/2024 Thank you.

## 2024-02-11 ENCOUNTER — Telehealth: Payer: Self-pay

## 2024-02-11 DIAGNOSIS — E119 Type 2 diabetes mellitus without complications: Secondary | ICD-10-CM | POA: Diagnosis not present

## 2024-02-11 DIAGNOSIS — R946 Abnormal results of thyroid function studies: Secondary | ICD-10-CM | POA: Diagnosis not present

## 2024-02-11 DIAGNOSIS — R799 Abnormal finding of blood chemistry, unspecified: Secondary | ICD-10-CM | POA: Diagnosis not present

## 2024-02-11 DIAGNOSIS — K219 Gastro-esophageal reflux disease without esophagitis: Secondary | ICD-10-CM | POA: Diagnosis not present

## 2024-02-11 DIAGNOSIS — K5909 Other constipation: Secondary | ICD-10-CM | POA: Diagnosis not present

## 2024-02-11 DIAGNOSIS — E785 Hyperlipidemia, unspecified: Secondary | ICD-10-CM | POA: Diagnosis not present

## 2024-02-11 DIAGNOSIS — I10 Essential (primary) hypertension: Secondary | ICD-10-CM | POA: Diagnosis not present

## 2024-02-11 DIAGNOSIS — K760 Fatty (change of) liver, not elsewhere classified: Secondary | ICD-10-CM | POA: Diagnosis not present

## 2024-02-11 DIAGNOSIS — G4733 Obstructive sleep apnea (adult) (pediatric): Secondary | ICD-10-CM | POA: Diagnosis not present

## 2024-02-11 DIAGNOSIS — D696 Thrombocytopenia, unspecified: Secondary | ICD-10-CM | POA: Diagnosis not present

## 2024-02-11 DIAGNOSIS — E1169 Type 2 diabetes mellitus with other specified complication: Secondary | ICD-10-CM | POA: Diagnosis not present

## 2024-02-11 DIAGNOSIS — I2510 Atherosclerotic heart disease of native coronary artery without angina pectoris: Secondary | ICD-10-CM | POA: Diagnosis not present

## 2024-02-11 NOTE — Telephone Encounter (Addendum)
 Patient was reached out on the 01/31/24 and 02/07/24 but the patient doesn't have voicemail set up and has not responded. They need to know what type of supplies the patient needs.   They faxed the SWO on 01/31/24 which needs to be signed by Dr.Young before they can process this order. They refaxed the SWO to the front.

## 2024-02-11 NOTE — Telephone Encounter (Signed)
 Copied from CRM [id number]. Topic: Clinical - Order For Equipment >> Feb 07, 2024  2:49 PM Celestine FALCON wrote: Reason for CRM: Patient is very frustrated at this point due to the communication concerns between [place] and the clinic.   The pt was told by [facility] that there was additional documents faxed over to Dr. Neysa for his completion for the CPAP supplies and machine prescription. She was told that she would receive a call back before EOD 02/07/2024, but she didn't receive a call. She did receive a MyChart message from CMA Amy stating Hello Malina,   I will check with our patient care coordinators.  They are the once responsible for getting the CPAP orders out to the medical equipment providers.  We placed an order for CPAP supplies to [facility] on 01/29/2024.  We will update you once we hear back from [place].   Thank you.   Amy Issac, CMA  I did relay this to her, but she's frustrated and wanted a call back from the clinic before the clinic closed. Please review the recently received documents requested to be completed by Dr. Neysa for the CPAP prescription from [facility]. Pt's phone number is [phone].    Spoke w/ patient / reached out to [facility] waiting on MD to sign paperwork

## 2024-02-11 NOTE — Telephone Encounter (Signed)
 I routed the information to [name] [name] in a previous telephone encounter.

## 2024-02-11 NOTE — Telephone Encounter (Signed)
 Patient was reached out on the 01/31/24 and 02/07/24 but the patient doesn't have voicemail set up and has not responded. They need to know what type of supplies the patient needs.   They faxed the SWO on 01/31/24 which needs to be signed by Dr.Young before they can process this order. They just refaxed it to the front desk.

## 2024-02-11 NOTE — Telephone Encounter (Signed)
 Copied from CRM [id number]. Topic: Clinical - Order For Equipment >> Feb 07, 2024  2:49 PM Celestine FALCON wrote: Reason for CRM: Patient is very frustrated at this point due to the communication concerns between [place] and the clinic.   The pt was told by [facility] that there was additional documents faxed over to Dr. Neysa for his completion for the CPAP supplies and machine prescription. She was told that she would receive a call back before EOD 02/07/2024, but she didn't receive a call. She did receive a MyChart message from CMA Amy stating Hello Denitra,   I will check with our patient care coordinators.  They are the once responsible for getting the CPAP orders out to the medical equipment providers.  We placed an order for CPAP supplies to [facility] on 01/29/2024.  We will update you once we hear back from [place].   Thank you.   Amy Issac, CMA  I did relay this to her, but she's frustrated and wanted a call back from the clinic before the clinic closed. Please review the recently received documents requested to be completed by Dr. Neysa for the CPAP prescription from [facility]. Pt's phone number is [phone]. >> Feb 11, 2024  3:14 PM Corean SAUNDERS wrote: Patient states she was advised that this matter would have bee resolved today as she needs her CPAP supplies, machine and mask. Patient is requesting a call back from Amy as soon as possible and states if this isnt resolved today, she will come to clinic tomorrow to speak with Dr. Neysa.

## 2024-02-12 ENCOUNTER — Other Ambulatory Visit: Payer: Self-pay

## 2024-02-12 NOTE — Telephone Encounter (Signed)
 Patient is calling back because she spoke with someone on yesterday amy was the person name . Patient says sdhe has been trying to get her cpap supplies . Patient says she spoke with [facility] and they are sending a form over to be signed . Patient is needing to speak with someone about what's going on with the dme company and supplies . Trying to see if a amy is available

## 2024-02-12 NOTE — Telephone Encounter (Signed)
 Got patient transferred over to [place] .

## 2024-02-12 NOTE — Telephone Encounter (Signed)
 Patient is wanting a call back . Patient is now requesting to speak with the office manager

## 2024-02-12 NOTE — Telephone Encounter (Signed)
 Everything needed sent over to Sarah with [facility] and I will follow up with them to ensure they have everything they need from our office to process CPAP supply order. Spoke with patient on the phone and sent mychart message to follow up as well.

## 2024-02-12 NOTE — Telephone Encounter (Signed)
 Please read previous phone notes.

## 2024-02-12 NOTE — Telephone Encounter (Signed)
 Everything has been signed,faxed and scan Spoke to PT [facility] I asked that she give them another day to make sure they have received fax . (Email forms to [facility] -[facility] to scan in chart).  -NFN

## 2024-02-13 ENCOUNTER — Telehealth (HOSPITAL_BASED_OUTPATIENT_CLINIC_OR_DEPARTMENT_OTHER): Payer: Self-pay

## 2024-02-13 NOTE — Telephone Encounter (Signed)
 Copied from CRM [id number]. Topic: Clinical - Order For Equipment >> Feb 07, 2024  2:49 PM Celestine FALCON wrote: Reason for CRM: Patient is very frustrated at this point due to the communication concerns between [place] and the clinic.   The pt was told by [facility] that there was additional documents faxed over to Dr. Neysa for his completion for the CPAP supplies and machine prescription. She was told that she would receive a call back before EOD 02/07/2024, but she didn't receive a call. She did receive a MyChart message from CMA Amy stating Hello Sherie,   I will check with our patient care coordinators.  They are the once responsible for getting the CPAP orders out to the medical equipment providers.  We placed an order for CPAP supplies to [facility] on 01/29/2024.  We will update you once we hear back from [place].   Thank you.   Amy Issac, CMA  I did relay this to her, but she's frustrated and wanted a call back from the clinic before the clinic closed. Please review the recently received documents requested to be completed by Dr. Neysa for the CPAP prescription from [facility]. Pt's phone number is [phone]. >> Feb 13, 2024  9:53 AM Corean SAUNDERS wrote: Charlie from [facility] will be faxing a request for DME order, clinical notes and sleep study results.  >> Feb 11, 2024  3:14 PM Corean SAUNDERS wrote: Patient states she was advised that this matter would have bee resolved today as she needs her CPAP supplies, machine and mask. Patient is requesting a call back from Amy as soon as possible and states if this isnt resolved today, she will come to clinic tomorrow to speak with Dr. Neysa.

## 2024-02-13 NOTE — Telephone Encounter (Signed)
 I spoke with Jojo at [facility] who states [facility] does not have any signed order or notes. I informed all information was sent to Sarah with [facility]. Confirmed with Lauraine and she responded with, emailed our Intake department about those docs because I checked this morning, and they weren't uploaded yet. You do not need to resend. I'll ask Intake to reach out to the patient and let them know we have what we need.

## 2024-02-14 DIAGNOSIS — H2513 Age-related nuclear cataract, bilateral: Secondary | ICD-10-CM | POA: Diagnosis not present

## 2024-02-14 DIAGNOSIS — H40003 Preglaucoma, unspecified, bilateral: Secondary | ICD-10-CM | POA: Diagnosis not present

## 2024-02-14 DIAGNOSIS — H16229 Keratoconjunctivitis sicca, not specified as Sjogren's, unspecified eye: Secondary | ICD-10-CM | POA: Diagnosis not present

## 2024-02-14 DIAGNOSIS — E119 Type 2 diabetes mellitus without complications: Secondary | ICD-10-CM | POA: Diagnosis not present

## 2024-02-17 ENCOUNTER — Telehealth: Payer: Self-pay

## 2024-02-18 ENCOUNTER — Encounter: Payer: Self-pay | Admitting: Family Medicine

## 2024-02-18 LAB — HM DIABETES EYE EXAM

## 2024-02-18 NOTE — Telephone Encounter (Signed)
 Pt is returning your My Chart message

## 2024-02-20 NOTE — Telephone Encounter (Signed)
Message for you.

## 2024-02-21 NOTE — Telephone Encounter (Signed)
 Called [facility].  Spoke with representative.  All documents have been signed by Dr. Neysa and were faxed to [facility].  Representative states the order has been confirmed and shipped out for CPAP supplies on 02/18/2024.  The tracking number is via [employer] [id number].  Attempted to call patient to give information.  First call, got VM, then phone disconnected.  Second attempt, phone rang about 10 times, then phone disconnected.  Was unable to leave a voicemail to inform patient of confirmation that CPAP supplies are on the way to her.  Called patient.  Gave patient all information today.  Informed patient to keep us  updated if she does not receive the CPAP supplies by 02/25/2024.  Patient verbalized understanding.

## 2024-02-21 NOTE — Telephone Encounter (Addendum)
 Called [facility].  CPAP machine and supplies shipped out on 02/18/2024 and are expected to arrive around 02/25/2024.  The tracking number the rep at [facility] gave me is [id number]  via [employer].  Attempted to call patient to give information.  First call was unable to leave a voicemail.  Second attempt, was disconnected before I could leave a voicemail.  Will leave patient a MyChart message.

## 2024-02-26 ENCOUNTER — Other Ambulatory Visit: Payer: Self-pay | Admitting: Cardiology

## 2024-02-27 ENCOUNTER — Encounter: Payer: Self-pay | Admitting: Cardiology

## 2024-02-27 MED ORDER — HYDRALAZINE HCL 100 MG PO TABS: 100.0000 mg | ORAL_TABLET | Freq: Three times a day (TID) | ORAL | 0 refills | Status: AC

## 2024-02-28 DIAGNOSIS — H40003 Preglaucoma, unspecified, bilateral: Secondary | ICD-10-CM | POA: Diagnosis not present

## 2024-03-06 DIAGNOSIS — H40003 Preglaucoma, unspecified, bilateral: Secondary | ICD-10-CM | POA: Diagnosis not present

## 2024-03-06 DIAGNOSIS — H16229 Keratoconjunctivitis sicca, not specified as Sjogren's, unspecified eye: Secondary | ICD-10-CM | POA: Diagnosis not present

## 2024-03-06 DIAGNOSIS — H2513 Age-related nuclear cataract, bilateral: Secondary | ICD-10-CM | POA: Diagnosis not present

## 2024-03-09 ENCOUNTER — Other Ambulatory Visit: Payer: Self-pay | Admitting: Family Medicine

## 2024-03-09 DIAGNOSIS — Z1231 Encounter for screening mammogram for malignant neoplasm of breast: Secondary | ICD-10-CM

## 2024-03-19 DIAGNOSIS — Z23 Encounter for immunization: Secondary | ICD-10-CM | POA: Diagnosis not present

## 2024-03-25 NOTE — Progress Notes (Signed)
 Aviva Wolfer                                          MRN: [id number]   03/25/2024   The VBCI Quality Team Specialist reviewed this patient medical record for the purposes of chart review for care gap closure. The following were reviewed: abstraction for care gap closure-glycemic status assessment.    VBCI Quality Team

## 2024-03-25 NOTE — Progress Notes (Signed)
 Dafina Suk                                          MRN: [id number]   03/25/2024   The VBCI Quality Team Specialist reviewed this patient medical record for the purposes of chart review for care gap closure. The following were reviewed: chart review for care gap closure-kidney health evaluation for diabetes:eGFR  and uACR.    VBCI Quality Team

## 2024-04-23 NOTE — Progress Notes (Signed)
 HPI: FU coronary disease. She has had a prior PCI of her diagonal. Her most recent catheterization in April of 2004 showed no obstructive disease, and her ejection fraction was normal. Abdominal ultrasound August 2020 showed no aneurysm.  Carotid Dopplers November 2023 showed 1 to 39% bilateral stenosis.  Echocardiogram December 2023 showed normal LV function, grade 2 diastolic dysfunction, mild left atrial enlargement.  Nuclear study February 2024 showed ejection fraction 71%, diaphragmatic attenuation but no ischemia.  Since I last saw her, she denies dyspnea, chest pain, palpitations or syncope.  Current Outpatient Medications  Medication Sig Dispense Refill   albuterol  (VENTOLIN  HFA) 108 (90 Base) MCG/ACT inhaler Inhale 1-2 puffs into the lungs every 6 (six) hours as needed for wheezing or shortness of breath. 1 each 0   amLODipine  (NORVASC ) 5 MG tablet Take 1 tablet (5 mg total) by mouth daily. 90 tablet 0   aspirin 81 MG tablet Take 81 mg by mouth daily.     blood glucose meter kit and supplies KIT Dispense based on patient and insurance preference. Use up to four times daily as directed. (FOR ICD-9 250.00, 250.01). 1 each 0   buPROPion  (WELLBUTRIN  XL) 150 MG 24 hr tablet Take 3 tablets (450 mg total) by mouth daily. 270 tablet 3   Calcium  Carbonate-Vit D-Min (RA CALCIUM  600/VIT D/MINERALS) 600-400 MG-UNIT CHEW Chew 1 tablet by mouth daily.     carvedilol  (COREG ) 12.5 MG tablet TAKE 1 AND 1/2 TABLET BY MOUTH TWICE DAILY WITH A MEAL 270 tablet 0   Cholecalciferol (VITAMIN D3) 3000 units TABS Take 1 tablet by mouth daily. 30 tablet    fluticasone  (FLONASE ) 50 MCG/ACT nasal spray Place 2 sprays into both nostrils daily. 16 g 12   glucose blood (ONETOUCH ULTRA) test strip TEST BLOOD SUGAR ONCE DAILY 100 strip 3   hydrALAZINE  (APRESOLINE ) 100 MG tablet Take 1 tablet (100 mg total) by mouth 3 (three) times daily. 270 tablet 0   hydrocortisone 2.5 % cream Apply topically 2 (two) times daily.      Iron , Ferrous Sulfate , 325 (65 Fe) MG TABS Take 325 mg by mouth every Monday, Wednesday, and Friday.     ketoconazole (NIZORAL) 2 % cream Apply topically daily.     ketoconazole (NIZORAL) 2 % shampoo Apply topically 3 (three) times a week.     Lancets (ONETOUCH DELICA PLUS LANCET33G) MISC USE AS INSTRUCTED TO CHECK BLOOD SUGAR ONCE A DAY 100 each 4   MOUNJARO 2.5 MG/0.5ML Pen Inject 2.5 mg into the skin once a week.     Multiple Vitamins-Minerals (MULTIVITAMIN ADULTS 50+ PO) Take 1 tablet every morning by mouth.      olmesartan -hydrochlorothiazide  (BENICAR  HCT) 40-12.5 MG tablet TAKE 1 TABLET BY MOUTH DAILY 90 tablet 1   pantoprazole  (PROTONIX ) 40 MG tablet Take 1 tablet (40 mg total) by mouth daily. 90 tablet 4   polyethylene glycol (MIRALAX  / GLYCOLAX ) 17 g packet Take 17 g by mouth daily.     Probiotic Product (PROBIOTIC DAILY PO) Take 1 capsule by mouth every morning.     rosuvastatin  (CRESTOR ) 20 MG tablet TAKE 1 TABLET(20 MG) BY MOUTH DAILY 90 tablet 0   No current facility-administered medications for this visit.     Past Medical History:  Diagnosis Date   Acquired deformity of right foot    Back pain    CAD (coronary artery disease) primary cardioloigst-  dr pietro   hx NSTEMI 10-24-2001  s/p  cardiac cath w/ PCI  and DES x1 to first diagonal   Chronic constipation    Constipation    COVID-19 06/02/2020   Depression    Fatty liver 04/26/2015   GERD (gastroesophageal reflux disease)    History of abnormal cervical Pap smear    ACUS 2012   History of exercise stress test 05-25-2014  dr pietro   borderline (indeterminate) with non-specific ST changes (mild ST depression in leads II,III, aVF, & V6, did not meet criteria for ischemia) ;  pt had low intolerence, no cp, normal heartrate and bp response, no arrhythmias   History of goiter    as child s/p  removal ,  per pt benign , neck area  (not thyroid )   History of heart attack    History of non-ST elevation myocardial  infarction (NSTEMI) 10/24/2001   s/p  PCI and DES to first diagonal   Hyperlipidemia    Hypertension    Joint pain    OSA on CPAP pulmoloigst-  dr young   per study 11-07-2004  severe osa   Right foot pain    S/P drug eluting coronary stent placement 10/27/2001   x1 to first diagonal    Seasonal and perennial allergic rhinitis    Sleep apnea    SOB (shortness of breath)    Type 2 diabetes mellitus (HCC)    Vitamin D  deficiency    Wears dentures    upper and lower partial denture   Wears glasses     Past Surgical History:  Procedure Laterality Date   BUNIONECTOMY Left 2012   BUNIONECTOMY Right 04/19/2017   Procedure: RIGHT BUNIONECTOMY;  Surgeon: Zan Factor, DPM;  Location: [facility];  Service: Podiatry;  Laterality: Right;   CARDIAC CATHETERIZATION  09-11-2002   dr morris   well-preserved LVF; continued patency previouly placed stent in diagonal branch;  mild luminal irregularities    CARDIOVASCULAR STRESS TEST  05-16-2011   dr pietro   normal nuclear study w/ no evidence ischemia/  normal LV function and wall motion , ef 77%   COLONOSCOPY  06/2021   TAs, diverticulosis, int hem, rpt 3 yrs ([facility])   CORONARY ANGIOPLASTY WITH STENT PLACEMENT  10-27-2001   dr morris   high-grade stenosis first diagonal post PCI and DES (TAXUS);  mild luminal irregularity throughout LAD, RCA, and very mild CFx;  preserved LVF   goiter removal  1968   per pt benign tumor removed from neck beside throat   HALLUX VALGUS LAPIDUS Right 04/19/2017   Procedure: HALLUX VALGUS LAPIDUS FUSION;  Surgeon: Zan Factor, DPM;  Location: [facility];  Service: Podiatry;  Laterality: Right;   HAMMER TOE SURGERY Right 04/19/2017   Procedure: HAMMER TOE CORRECTION RIGHT 2ND;  Surgeon: Zan Factor, DPM;  Location: [facility];  Service: Podiatry;  Laterality: Right;   METATARSAL OSTEOTOMY Right 04/19/2017   Procedure: 2ND METATARSAL OSTEOTOMY;   Surgeon: Zan Factor, DPM;  Location: [facility];  Service: Podiatry;  Laterality: Right;   TONSILLECTOMY AND ADENOIDECTOMY  age 89   TUBAL LIGATION Bilateral 1985    Social History   Socioeconomic History   Marital status: Married    Spouse name: Lynwood   Number of children: 3   Years of education: Not on file   Highest education level: 12th grade  Occupational History   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    Employer: [employer]   Occupation: Retired  Tobacco Use  Smoking status: Former    Current packs/day: 0.00    Average packs/day: 1.5 packs/day for 48.0 years (72.0 ttl pk-yrs)    Types: Cigarettes    Start date: 09/23/1964    Quit date: 09/23/2012    Years since quitting: 11.6   Smokeless tobacco: Never  Vaping Use   Vaping status: Never Used  Substance and Sexual Activity   Alcohol use: Yes    Alcohol/week: 0.0 standard drinks of alcohol    Comment: occasional wine   Drug use: No   Sexual activity: Not Currently  Other Topics Concern   Not on file  Social History Narrative   Caffeine Use:  2 cups coffee daily   Regular exercise:  No   Lives with husband    Occ: retired, 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    Activity: no regular activity    Diet: good water, fruits/vegetables daily    Social Drivers of Corporate Investment Banker Strain: Low Risk  (02/28/2023)   Overall Financial Resource Strain (CARDIA)    Difficulty of Paying Living Expenses: Not hard at all  Food Insecurity: No Food Insecurity (10/07/2023)   Received from [facility]   Hunger Vital Sign    Within the past 12 months, you worried that your food would run out before you got the money to buy more.: Never true    Within the past 12 months, the food you bought just didn't last and you didn't have money to get more.: Never true  Transportation Needs: No Transportation Needs (10/07/2023)   Received from [facility]   PRAPARE -  Transportation    Lack of Transportation (Medical): No    Lack of Transportation (Non-Medical): No  Physical Activity: Insufficiently Active (02/28/2023)   Exercise Vital Sign    Days of Exercise per Week: 2 days    Minutes of Exercise per Session: 20 min  Stress: No Stress Concern Present (02/28/2023)   [employer] of Occupational Health - Occupational Stress Questionnaire    Feeling of Stress : Only a little  Social Connections: Socially Integrated (02/28/2023)   Social Connection and Isolation Panel    Frequency of Communication with Friends and Family: More than three times a week    Frequency of Social Gatherings with Friends and Family: More than three times a week    Attends Religious Services: More than 4 times per year    Active Member of [employer] or Organizations: Yes    Attends Banker Meetings: More than 4 times per year    Marital Status: Married  Catering Manager Violence: Not At Risk (03/01/2022)   Humiliation, Afraid, Rape, and Kick questionnaire    Fear of Current or Ex-Partner: No    Emotionally Abused: No    Physically Abused: No    Sexually Abused: No    Family History  Problem Relation Age of Onset   Esophageal cancer Mother    Cancer Mother        throat, cervical   Diabetes Mother    Stroke Mother    Heart attack Mother    Heart disease Mother    Hypertension Mother    Thyroid  disease Mother    Alcoholism Mother    Liver disease Mother    Cancer Father        prostate   Diabetes Sister    Heart attack Sister    Hypertension Sister    Hyperlipidemia Sister    Colon cancer Neg Hx  Breast cancer Neg Hx    Colon polyps Neg Hx    Stomach cancer Neg Hx    Rectal cancer Neg Hx     ROS: no fevers or chills, productive cough, hemoptysis, dysphasia, odynophagia, melena, hematochezia, dysuria, hematuria, rash, seizure activity, orthopnea, PND, pedal edema, claudication. Remaining systems are negative.  Physical Exam: Well-developed  well-nourished in no acute distress.  Skin is warm and dry.  HEENT is normal.  Neck is supple.  Chest is clear to auscultation with normal expansion.  Cardiovascular exam is regular rate and rhythm.  Abdominal exam nontender or distended. No masses palpated. Extremities show no edema. neuro grossly intact  EKG Interpretation Date/Time:  Monday May 04 2024 09:33:31 EST Ventricular Rate:  65 PR Interval:  184 QRS Duration:  80 QT Interval:  378 QTC Calculation: 393 R Axis:   73  Text Interpretation: Normal sinus rhythm Low voltage QRS Confirmed by Pietro Rogue ([id number]) on 05/04/2024 9:37:36 AM    A/P  1 coronary artery disease-patient doing well from a symptomatic standpoint with no chest pain.  Continue aspirin and statin.  2 hyperlipidemia-continue statin.  Most recent LDL 58.  Liver functions normal.  3 hypertension-patient's blood pressure is controlled.  Continue present medical regimen.  4 lower extremity edema-continue diuretic at present dose.  Rogue Pietro, MD

## 2024-05-01 ENCOUNTER — Ambulatory Visit
Admission: RE | Admit: 2024-05-01 | Discharge: 2024-05-01 | Disposition: A | Discharge status: Home / Self Care | Source: Ambulatory Visit | Attending: Family Medicine | Admitting: Family Medicine

## 2024-05-01 DIAGNOSIS — Z1231 Encounter for screening mammogram for malignant neoplasm of breast: Secondary | ICD-10-CM

## 2024-05-04 ENCOUNTER — Ambulatory Visit: Attending: Cardiology | Admitting: Cardiology

## 2024-05-04 ENCOUNTER — Encounter: Payer: Self-pay | Admitting: Cardiology

## 2024-05-04 VITALS — BP 109/50 | HR 65 | Ht 63.0 in | Wt 173.8 lb

## 2024-05-04 DIAGNOSIS — E785 Hyperlipidemia, unspecified: Secondary | ICD-10-CM

## 2024-05-04 DIAGNOSIS — I872 Venous insufficiency (chronic) (peripheral): Secondary | ICD-10-CM | POA: Diagnosis not present

## 2024-05-04 DIAGNOSIS — R600 Localized edema: Secondary | ICD-10-CM

## 2024-05-04 DIAGNOSIS — I2510 Atherosclerotic heart disease of native coronary artery without angina pectoris: Secondary | ICD-10-CM | POA: Diagnosis not present

## 2024-05-04 DIAGNOSIS — M79605 Pain in left leg: Secondary | ICD-10-CM | POA: Diagnosis not present

## 2024-05-04 DIAGNOSIS — I10 Essential (primary) hypertension: Secondary | ICD-10-CM

## 2024-05-04 DIAGNOSIS — E7800 Pure hypercholesterolemia, unspecified: Secondary | ICD-10-CM

## 2024-05-04 NOTE — Patient Instructions (Signed)

## 2024-05-08 NOTE — Progress Notes (Unsigned)
 HPI-  female former smoker followed for OSA, complicated by allergic rhinitis, CAD/MI/stent, obesity, depression, DM 2, HBP, GERD, Allergic Rhinitis,  NPSG 11/07/04 Severe OSA, AHI 45/ hr, CPAP 10.  PFT 05/03/23-Within lower limits of normal. Insignificant response to bronchodilator Airtrapping without overinflation Minimal Diffusion Defect ==============================================     11/01/23- 73 year old female former smoker (50 pkyrs) followed for OSA, RLL Nodule, complicated by allergic rhinitis, CAD/MI/stent, obesity, depression, DM 2, HBP, GERD,  - Albuterol  hfa,  CPAP 5-15/ [facility]     AirSense 11 AutoSet Download compliance- 97%, AHI 2.3/hr Body weight today-183 lbs Download reviewed. CT chest Low Dose screen- 10/25/23   report pend Discussed the use of AI scribe software for clinical note transcription with the patient, who gave verbal consent to proceed.  History of Present Illness   [name] [name] is a 73 year old female who presents with a persistent cough.  The cough began last Sunday as a 'tickle' with acute bronchitis and progressed to a loose cough, peaking in severity on Monday. It has improved over the past week, but she still feels an inability to clear her chest. She is able to expectorate mucous, which appears non-worrisome. There is no fever or sore throat. She may clear without treatment, but we discussed providing doxycycline  to hold because of long weekend.  She has a history of smoking. She has not been using her inhaler. We are awaiting report on her latest low dose screening chest CT.  She uses a CPAP machine for sleep apnea, with settings between 5 and 15, experiencing less than three breakthrough apneas per hour. Her oxygen saturation was 92% during a recent check-up, improving with deep breathing.     Assessment and Plan:    Acute bronchitis Likely mild viral bronchitis, improving, self-limiting. - Prescribed doxycycline  for use if symptoms  worsen or persist. - Advised inhaler use as needed.  Obstructive sleep apnea Well-managed with CPAP therapy, effective settings with acceptable apnea rate. - Continue current CPAP settings.    05/12/24- 72 year old female former smoker (50 pkyrs) followed for OSA, RLL Nodule, complicated by allergic rhinitis, CAD/MI/stent, obesity, depression, DM 2, HBP, GERD,  - Albuterol  hfa,  CPAP 5-15/ [facility]     AirSense 11 AutoSet Download compliance- 97%, AHI 1.3/hr Body weight today-174 lbs -----Sleep is good.  No problems noted. Discussed the use of AI scribe software for clinical note transcription with the patient, who gave verbal consent to proceed.  History of Present Illness   [name] [name] is a 73 year old female who presents for follow-up regarding her lung screening CT scan.  She is followed for OSA and doing well on CPAP. Download reviewed with her.  She had a lung screening CT on Oct 25, 2023, that showed a previously noted nodule has decreased in size and other small nodules are stable. She underwent screening due to smoking. Coronary artery calcification was again noted and is unchanged. She uses CPAP for sleep apnea and finds it effective. She has no new or different respiratory symptoms since the last visit.   CTchest low dose screen 10/25/23 IMPRESSION: 1. Lung-RADS 2, benign appearance or behavior. Continue annual screening with low-dose chest CT without contrast in 12 months. The nodule that was new on prior exam has diminished in size consistent with benign etiology. Additional pulmonary nodules are stable. 2. Coronary artery calcifications. 3. Small pericardial effusion. Aortic Atherosclerosis (ICD10-I70.0) and Emphysema (ICD10-J43.9).    Assessment and Plan:    Stable pulmonary nodules,  including shrinking solitary nodule Recent CT showed benign shrinking solitary nodule, likely old inflammation. Other nodules stable, likely scars. -She continues CCT low dose  screen program.  Coronary artery calcification Calcium  in coronary arteries consistent with previous findings. - Continue monitoring coronary artery calcification.  Obstructive sleep apnea, well controlled on CPAP- benefits from C PAP Obstructive sleep apnea well controlled with CPAP. CPAP supply issues resolved. - Continue CPAP therapy. - Request follow-up with sleep doctor for ongoing management.      ROS-see HPI   + = positive Constitutional:    weight loss, night sweats, fevers, chills, +fatigue, lassitude. HEENT:    headaches, difficulty swallowing, tooth/dental problems, sore throat,       sneezing, itching, ear ache, nasal congestion, post nasal drip, snoring CV:    chest pain, orthopnea, PND, swelling in lower extremities, anasarca,                                   dizziness, palpitations Resp:   shortness of breath with exertion or at rest.                productive cough,   +non-productive cough, coughing up of blood.              change in color of mucus.  wheezing.   Skin:    rash or lesions. GI: + heartburn, indigestion, +abdominal pain, nausea, vomiting, diarrhea,                 change in bowel habits, loss of appetite GU: dysuria, change in color of urine, no urgency or frequency.   flank pain. MS:   +joint pain, stiffness, decreased range of motion, back pain. Neuro-     nothing unusual Psych:  change in mood or affect.  +depression or anxiety.   memory loss.  OBJ- Physical Exam General- Alert, Oriented, Affect-appropriate, Distress- none acute, + obese Skin- rash-none, lesions- none, excoriation- none Lymphadenopathy- none Head- atraumatic            Eyes- Gross vision intact, PERRLA, conjunctivae and secretions clear            Ears- Hearing, canals-normal            Nose- Clear, no-Septal dev, mucus, polyps, erosion, perforation             Throat- Mallampati IV , mucosa clear , drainage- none, tonsils- atrophic Neck- flexible , trachea midline, no stridor ,  thyroid  nl, carotid no bruit Chest - symmetrical excursion , unlabored           Heart/CV- RRR ,  murmur+1/6 RUSB , no gallop  , no rub, nl s1 s2                           - JVD- none , edema- none, stasis changes- none, varices- none           Lung- +coarse in bases, wheeze- none, cough+slight , dullness-none, rub- none           Chest wall-  Abd-  Br/ Gen/ Rectal- Not done, not indicated Extrem- +superficial varices-legs Neuro- grossly intact to observation

## 2024-05-12 ENCOUNTER — Encounter: Payer: Self-pay | Admitting: Internal Medicine

## 2024-05-12 ENCOUNTER — Ambulatory Visit: Admitting: Internal Medicine

## 2024-05-12 VITALS — BP 110/56 | HR 70 | Temp 98.4°F | Ht 63.0 in | Wt 174.0 lb

## 2024-05-12 DIAGNOSIS — R911 Solitary pulmonary nodule: Secondary | ICD-10-CM

## 2024-05-12 DIAGNOSIS — G4733 Obstructive sleep apnea (adult) (pediatric): Secondary | ICD-10-CM

## 2024-05-12 NOTE — Patient Instructions (Addendum)
 We can continue CPAP 5-15  Order- Lauraine Lites, NP- please continue Ms Knobloch in low dose screening CT program previously enrolled through her PCP. Latest was 10/25/23  At checkout, please ask front desk to bring you back with a sleep doctor

## 2024-05-18 NOTE — Progress Notes (Signed)
 [name] [name]                                          MRN: [id number]   05/18/2024   The VBCI Quality Team Specialist reviewed this patient medical record for the purposes of chart review for care gap closure. The following were reviewed: chart review for care gap closure-kidney health evaluation for diabetes:eGFR  and uACR.    VBCI Quality Team

## 2024-05-20 ENCOUNTER — Encounter: Payer: Self-pay | Admitting: Psychiatry

## 2024-05-20 ENCOUNTER — Ambulatory Visit: Payer: PPO | Admitting: Psychiatry

## 2024-05-20 DIAGNOSIS — F3342 Major depressive disorder, recurrent, in full remission: Secondary | ICD-10-CM | POA: Diagnosis not present

## 2024-05-20 MED ORDER — BUPROPION HCL ER (XL) 150 MG PO TB24: 450.0000 mg | ORAL_TABLET | Freq: Every day | ORAL | 3 refills | Status: AC

## 2024-05-20 NOTE — Progress Notes (Signed)
 [name] [name] [id number] August 06, 1950 73 y.o.  Subjective:   Patient ID:  [name] [name] is a 73 y.o. (DOB 14-Dec-1950) female.  Chief Complaint:  Chief Complaint  Patient presents with   Follow-up   Depression    [name] [name] presents to the office today for follow-up of depression and med change.   When seen December. Recommended NAC  And stop naltrexone to see if it was causing GI problems.  Rec counseling for hoarding but she didn't go.  seen June 2020 & 05/2019.  No meds were changed.  Only taking Wellbutrin  XL 450 mg daily from here.  Following noted: Some therapy with  Octaviano [facility]arch about hoarding but it never got followed up on it.  He doesn't respond to phone calls or emails.  Did accomplish some with hoarding bc no anxiety with it but he helped her get some of it done. Retirement date Mar 15, 2018 and used to it now.  All these years she's neglected her house but doesn't do anything about it.  Some hoarding.  Doesn't like to stay at home.  No where to put anything in the house bc the closets are full.  Can't get motivated to do anything about it.  Thinks it's motivation and uncertainty over what to do with things.  Won't let people visit bc she's embarrassed.  Doesn't like the house.  H won't move and won't help get rid of things.  05/17/20 appt with following noted: Still only on Wellbutrin  450 mg daily. No SE. Retired and [employer] keeping her busy, sitting 2-3 times per week.   Hard adjustment with retirement initially resolved.  Not depressed. Plan: [name] [name]'s depression is controlled and she is satisfied with the medications, but wonders about decreasing it.  OK to reduce Welllbutrin to 300 mg daily.  05/17/2021 appointment with the following noted: Didn't work to reduce Wellbutrin  to 300 mg daily with more depression and irritability. D noticed too.  It's amazing how it works with more Wellbutrin  to 450 mg daily. Picks on H too much if not taking Wellbutrin .   Plan: Failed attempt  to reduce Welllbutrin to 300 mg daily, therefore continue 450 mg AM.  05/17/22 appt noted: Continues Wellbutrin  450 am as only psych med. Gets a fog in her head.  During the day but wears off as the day progresses. But is not every day  Sx present for months. Asks if it could be SE.   Normal labs and thyroid  and carotid arteries.   Pleased with Wellbutrin .  Mind would go to the wrong places if on less. Sleep good with retirement. 8 grandkids from 3 kids.    05/20/23 appt noted: What is difference between situational vs dep.  Married 46 years and both retired and now dealing with his selfishness all the time bc they are around all the time.  Tries to keep busy and sometimes stay out of the house.  No abuse.   Continue Wellbutrin  XL 450 mg AM. No SE.   Patient reports stable mood and denies depressed or irritable moods.  Patient denies any recent difficulty with anxiety.  Patient denies difficulty with sleep initiation or maintenance but gets 8 hours or more.  Denies appetite disturbance.  Patient reports that energy has been good.  Motivation to clean is not good. Patient denies any difficulty with concentration.  Patient denies any suicidal ideation.  05/19/24 appt noted:  Med: Wellbutrin  XL 450 mg AM. No SE.   Good other than bad  back.   Doctor changed DM med to Mounjaro and lost wt and better DM control.   Sleep good.  Too much some days.  Day goes better if gets up but is a night person.  Uses TV to distract her head so she falls asleep better. Uses full face mask CPAP and consistent.     Past Psychiatric Medication Trials: Wellbutrin  XL 450 + Vraylar 1.5, Abilify 10,  Xanax She has been on Wellbutrin  from this practice since 2004 Naltrexone [facility]  Review of Systems:  Review of Systems  Cardiovascular:  Negative for palpitations.  Musculoskeletal:  Positive for arthralgias and back pain.  Neurological:  Negative for tremors and weakness.  Psychiatric/Behavioral:  Positive for  decreased concentration. Negative for agitation, behavioral problems, confusion, dysphoric mood, hallucinations, self-injury, sleep disturbance and suicidal ideas. The patient is not nervous/anxious and is not hyperactive.     Medications: I have reviewed the patient's current medications.  Current Outpatient Medications  Medication Sig Dispense Refill   albuterol  (VENTOLIN  HFA) 108 (90 Base) MCG/ACT inhaler Inhale 1-2 puffs into the lungs every 6 (six) hours as needed for wheezing or shortness of breath. 1 each 0   amLODipine  (NORVASC ) 5 MG tablet Take 1 tablet (5 mg total) by mouth daily. 90 tablet 0   aspirin 81 MG tablet Take 81 mg by mouth daily.     blood glucose meter kit and supplies KIT Dispense based on patient and insurance preference. Use up to four times daily as directed. (FOR ICD-9 250.00, 250.01). 1 each 0   Calcium  Carbonate-Vit D-Min (RA CALCIUM  600/VIT D/MINERALS) 600-400 MG-UNIT CHEW Chew 1 tablet by mouth daily.     carvedilol  (COREG ) 12.5 MG tablet TAKE 1 AND 1/2 TABLET BY MOUTH TWICE DAILY WITH A MEAL 270 tablet 0   Cholecalciferol (VITAMIN D3) 3000 units TABS Take 1 tablet by mouth daily. 30 tablet    fluticasone  (FLONASE ) 50 MCG/ACT nasal spray Place 2 sprays into both nostrils daily. 16 g 12   glucose blood (ONETOUCH ULTRA) test strip TEST BLOOD SUGAR ONCE DAILY 100 strip 3   hydrALAZINE  (APRESOLINE ) 100 MG tablet Take 1 tablet (100 mg total) by mouth 3 (three) times daily. 270 tablet 0   hydrocortisone 2.5 % cream Apply topically 2 (two) times daily.     Iron , Ferrous Sulfate , 325 (65 Fe) MG TABS Take 325 mg by mouth every Monday, Wednesday, and Friday.     ketoconazole (NIZORAL) 2 % cream Apply topically daily.     ketoconazole (NIZORAL) 2 % shampoo Apply topically 3 (three) times a week.     Lancets (ONETOUCH DELICA PLUS LANCET33G) MISC USE AS INSTRUCTED TO CHECK BLOOD SUGAR ONCE A DAY 100 each 4   MOUNJARO 2.5 MG/0.5ML Pen Inject 2.5 mg into the skin once a week.      Multiple Vitamins-Minerals (MULTIVITAMIN ADULTS 50+ PO) Take 1 tablet every morning by mouth.      olmesartan -hydrochlorothiazide  (BENICAR  HCT) 40-12.5 MG tablet TAKE 1 TABLET BY MOUTH DAILY 90 tablet 1   pantoprazole  (PROTONIX ) 40 MG tablet Take 1 tablet (40 mg total) by mouth daily. 90 tablet 4   polyethylene glycol (MIRALAX  / GLYCOLAX ) 17 g packet Take 17 g by mouth daily.     Probiotic Product (PROBIOTIC DAILY PO) Take 1 capsule by mouth every morning.     rosuvastatin  (CRESTOR ) 20 MG tablet TAKE 1 TABLET(20 MG) BY MOUTH DAILY 90 tablet 0   buPROPion  (WELLBUTRIN  XL) 150 MG 24 hr tablet Take  3 tablets (450 mg total) by mouth daily. 270 tablet 3   No current facility-administered medications for this visit.    Medication Side Effects: None  Allergies: No Known Allergies  Past Medical History:  Diagnosis Date   Acquired deformity of right foot    Back pain    CAD (coronary artery disease) primary cardioloigst-  dr pietro   hx NSTEMI 10-24-2001  s/p  cardiac cath w/ PCI and DES x1 to first diagonal   Chronic constipation    Constipation    COVID-19 06/02/2020   Depression    Fatty liver 04/26/2015   GERD (gastroesophageal reflux disease)    History of abnormal cervical Pap smear    ACUS 2012   History of exercise stress test 05-25-2014  dr pietro   borderline (indeterminate) with non-specific ST changes (mild ST depression in leads II,III, aVF, & V6, did not meet criteria for ischemia) ;  pt had low intolerence, no cp, normal heartrate and bp response, no arrhythmias   History of goiter    as child s/p  removal ,  per pt benign , neck area  (not thyroid )   History of heart attack    History of non-ST elevation myocardial infarction (NSTEMI) 10/24/2001   s/p  PCI and DES to first diagonal   Hyperlipidemia    Hypertension    Joint pain    OSA on CPAP pulmoloigst-  dr young   per study 11-07-2004  severe osa   Right foot pain    S/P drug eluting coronary stent placement  10/27/2001   x1 to first diagonal    Seasonal and perennial allergic rhinitis    Sleep apnea    SOB (shortness of breath)    Type 2 diabetes mellitus (HCC)    Vitamin D  deficiency    Wears dentures    upper and lower partial denture   Wears glasses     Family History  Problem Relation Age of Onset   Esophageal cancer Mother    Cancer Mother        throat, cervical   Diabetes Mother    Stroke Mother    Heart attack Mother    Heart disease Mother    Hypertension Mother    Thyroid  disease Mother    Alcoholism Mother    Liver disease Mother    Cancer Father        prostate   Diabetes Sister    Heart attack Sister    Hypertension Sister    Hyperlipidemia Sister    Colon cancer Neg Hx    Breast cancer Neg Hx    Colon polyps Neg Hx    Stomach cancer Neg Hx    Rectal cancer Neg Hx     Social History   Socioeconomic History   Marital status: Married    Spouse name: Lynwood   Number of children: 3   Years of education: Not on file   Highest education level: 12th grade  Occupational History   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    Employer: [employer]   Occupation: Retired  Tobacco Use   Smoking status: Former    Current packs/day: 0.00    Average packs/day: 1.5 packs/day for 48.0 years (72.0 ttl pk-yrs)    Types: Cigarettes    Start date: 09/23/1964    Quit date: 09/23/2012    Years since quitting: 11.6   Smokeless tobacco: Never  Vaping Use   Vaping status:  Never Used  Substance and Sexual Activity   Alcohol use: Yes    Alcohol/week: 0.0 standard drinks of alcohol    Comment: occasional wine   Drug use: No   Sexual activity: Not Currently  Other Topics Concern   Not on file  Social History Narrative   Caffeine Use:  2 cups coffee daily   Regular exercise:  No   Lives with husband    Occ: retired, 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    Activity: no regular activity    Diet: good water, fruits/vegetables daily     Social Drivers of Corporate Investment Banker Strain: Low Risk  (02/28/2023)   Overall Financial Resource Strain (CARDIA)    Difficulty of Paying Living Expenses: Not hard at all  Food Insecurity: No Food Insecurity (10/07/2023)   Received from [facility]   Hunger Vital Sign    Within the past 12 months, you worried that your food would run out before you got the money to buy more.: Never true    Within the past 12 months, the food you bought just didn't last and you didn't have money to get more.: Never true  Transportation Needs: No Transportation Needs (10/07/2023)   Received from [facility]   PRAPARE - Transportation    Lack of Transportation (Medical): No    Lack of Transportation (Non-Medical): No  Physical Activity: Insufficiently Active (02/28/2023)   Exercise Vital Sign    Days of Exercise per Week: 2 days    Minutes of Exercise per Session: 20 min  Stress: No Stress Concern Present (02/28/2023)   [employer] of Occupational Health - Occupational Stress Questionnaire    Feeling of Stress : Only a little  Social Connections: Socially Integrated (02/28/2023)   Social Connection and Isolation Panel    Frequency of Communication with Friends and Family: More than three times a week    Frequency of Social Gatherings with Friends and Family: More than three times a week    Attends Religious Services: More than 4 times per year    Active Member of [employer] or Organizations: Yes    Attends Banker Meetings: More than 4 times per year    Marital Status: Married  Catering Manager Violence: Not At Risk (03/01/2022)   Humiliation, Afraid, Rape, and Kick questionnaire    Fear of Current or Ex-Partner: No    Emotionally Abused: No    Physically Abused: No    Sexually Abused: No    Past Medical History, Surgical history, Social history, and Family history were reviewed and updated as appropriate.   Please see review of systems for further details on the  patient's review from today.   Objective:   Physical Exam:  There were no vitals taken for this visit.  Physical Exam Constitutional:      General: She is not in acute distress.    Appearance: She is well-developed. She is obese.  Musculoskeletal:        General: No deformity.  Neurological:     Mental Status: She is alert and oriented to person, place, and time.     Motor: No tremor.     Coordination: Coordination normal.     Gait: Gait normal.  Psychiatric:        Attention and Perception: She is attentive.        Mood and Affect: Mood is not anxious or depressed. Affect is not labile, blunt, angry, tearful or inappropriate.  Speech: Speech normal.        Behavior: Behavior normal.        Thought Content: Thought content normal. Thought content is not paranoid or delusional. Thought content does not include homicidal or suicidal ideation.        Judgment: Judgment normal.     Comments: Insight intact. No auditory or visual hallucinations. No delusions.  Positive affect.        Lab Review:     Component Value Date/Time   NA 142 01/28/2023 0937   K 3.9 01/28/2023 0937   CL 102 01/28/2023 0937   CO2 26 01/28/2023 0937   GLUCOSE 178 (H) 01/28/2023 0937   GLUCOSE 124 (H) 03/05/2022 1144   BUN 18 01/28/2023 0937   CREATININE 0.93 01/28/2023 0937   CREATININE 0.70 12/15/2014 1949   CALCIUM  9.4 01/28/2023 0937   PROT 6.6 01/28/2023 0937   ALBUMIN 4.2 01/28/2023 0937   AST 19 01/28/2023 0937   ALT 23 01/28/2023 0937   ALKPHOS 65 01/28/2023 0937   BILITOT 0.4 01/28/2023 0937   GFRNONAA 80 06/04/2019 1038   GFRNONAA 80 10/27/2013 0910   GFRAA 93 06/04/2019 1038   GFRAA >89 10/27/2013 0910       Component Value Date/Time   WBC 4.2 02/25/2023 0845   RBC 4.00 02/25/2023 0845   HGB 11.6 (L) 02/25/2023 0845   HGB 13.3 09/05/2016 1202   HCT 35.2 (L) 02/25/2023 0845   HCT 42.1 09/05/2016 1202   PLT 139.0 (L) 02/25/2023 0845   MCV 87.9 02/25/2023 0845   MCV 89  09/05/2016 1202   MCH 27.8 07/13/2022 0856   MCHC 33.0 02/25/2023 0845   RDW 15.6 (H) 02/25/2023 0845   RDW 15.7 (H) 09/05/2016 1202   LYMPHSABS 1.0 02/25/2023 0845   LYMPHSABS 1.7 09/05/2016 1202   MONOABS 0.3 02/25/2023 0845   EOSABS 0.2 02/25/2023 0845   EOSABS 0.1 09/05/2016 1202   BASOSABS 0.0 02/25/2023 0845   BASOSABS 0.0 09/05/2016 1202    No results found for: POCLITH, LITHIUM   No results found for: PHENYTOIN, PHENOBARB, VALPROATE, CBMZ   .res Assessment: Plan:    Recurrent major depression in full remission - Plan: buPROPion  (WELLBUTRIN  XL) 150 MG 24 hr tablet   [name] [name]'s depression is controlled and she is satisfied with the medications,  Failed attempt to reduce Welllbutrin to 300 mg daily, therefore continue 450 mg AM. Disc risk relapse.  Disc difference between situational dep and major dep.  She does not have major depression acitve now.  Disc marital frustrations.  Disc proper sleep amounts.  Feels better if doesn't oversleep.  Disc possible causes of fogginess in early part of the day.  Most likely clonidine , but could be Wellbutrin  and could try splitting the dose.  15-minute appointment  FU 12 mos.  Lorene Macintosh, MD, DFAPA  Please see After Visit Summary for patient specific instructions.  No future appointments.   No orders of the defined types were placed in this encounter.     -------------------------------

## 2024-05-24 ENCOUNTER — Other Ambulatory Visit: Payer: Self-pay | Admitting: Cardiology

## 2024-05-24 DIAGNOSIS — E7800 Pure hypercholesterolemia, unspecified: Secondary | ICD-10-CM

## 2024-05-25 ENCOUNTER — Other Ambulatory Visit: Payer: Self-pay | Admitting: Cardiology

## 2024-06-02 NOTE — Progress Notes (Signed)
 [name] [name]                                          MRN: [id number]   06/02/2024   The [facility] Quality Team Specialist reviewed this patient medical record for the purposes of chart review for care gap closure. The following were reviewed: chart review for care gap closure-kidney health evaluation for diabetes:eGFR  and uACR.    [facility] Quality Team

## 2024-06-19 ENCOUNTER — Other Ambulatory Visit: Payer: Self-pay | Admitting: Cardiology

## 2025-05-20 ENCOUNTER — Ambulatory Visit: Admitting: Psychiatry
